# Patient Record
Sex: Female | Born: 1945 | Race: Black or African American | Hispanic: No | Marital: Single | Smoking: Former smoker
Health system: Southern US, Community
[De-identification: ages and names within clinical notes are randomized; demographics above are authoritative.]

## PROBLEM LIST (undated history)

## (undated) DIAGNOSIS — E039 Hypothyroidism, unspecified: Secondary | ICD-10-CM

## (undated) DIAGNOSIS — M179 Osteoarthritis of knee, unspecified: Secondary | ICD-10-CM

## (undated) DIAGNOSIS — E669 Obesity, unspecified: Secondary | ICD-10-CM

## (undated) DIAGNOSIS — E785 Hyperlipidemia, unspecified: Secondary | ICD-10-CM

## (undated) DIAGNOSIS — Z6830 Body mass index (BMI) 30.0-30.9, adult: Secondary | ICD-10-CM

## (undated) DIAGNOSIS — M25569 Pain in unspecified knee: Secondary | ICD-10-CM

## (undated) DIAGNOSIS — I10 Essential (primary) hypertension: Secondary | ICD-10-CM

## (undated) DIAGNOSIS — M1710 Unilateral primary osteoarthritis, unspecified knee: Secondary | ICD-10-CM

## (undated) DIAGNOSIS — E119 Type 2 diabetes mellitus without complications: Secondary | ICD-10-CM

## (undated) DIAGNOSIS — A048 Other specified bacterial intestinal infections: Secondary | ICD-10-CM

## (undated) DIAGNOSIS — K219 Gastro-esophageal reflux disease without esophagitis: Secondary | ICD-10-CM

## (undated) DIAGNOSIS — Z794 Long term (current) use of insulin: Secondary | ICD-10-CM

## (undated) HISTORY — DX: Essential (primary) hypertension: I10

## (undated) HISTORY — DX: Body mass index (BMI) 30.0-30.9, adult: Z68.30

## (undated) HISTORY — PX: COLONOSCOPY: SHX174

## (undated) HISTORY — DX: Obesity, unspecified: E66.9

## (undated) HISTORY — DX: Osteoarthritis of knee, unspecified: M17.9

## (undated) HISTORY — DX: Pain in unspecified knee: M25.569

## (undated) HISTORY — PX: UPPER GASTROINTESTINAL ENDOSCOPY: SHX188

## (undated) HISTORY — DX: Unilateral primary osteoarthritis, unspecified knee: M17.10

## (undated) HISTORY — DX: Long term (current) use of insulin: Z79.4

## (undated) HISTORY — PX: CHOLECYSTECTOMY: SHX55

## (undated) HISTORY — DX: Other specified bacterial intestinal infections: A04.8

## (undated) HISTORY — DX: Type 2 diabetes mellitus without complications: E11.9

## (undated) HISTORY — DX: Hyperlipidemia, unspecified: E78.5

---

## 1968-03-02 HISTORY — PX: TOTAL ABDOMINAL HYSTERECTOMY: SHX209

## 1968-03-02 HISTORY — PX: ABDOMINAL HYSTERECTOMY: SHX81

## 1985-03-02 DIAGNOSIS — IMO0001 Reserved for inherently not codable concepts without codable children: Secondary | ICD-10-CM

## 1985-03-02 HISTORY — DX: Reserved for inherently not codable concepts without codable children: IMO0001

## 2000-10-21 ENCOUNTER — Encounter: Payer: Self-pay | Admitting: Family Medicine

## 2000-10-21 ENCOUNTER — Ambulatory Visit (HOSPITAL_COMMUNITY): Admission: RE | Admit: 2000-10-21 | Discharge: 2000-10-21 | Payer: Self-pay | Admitting: Family Medicine

## 2000-12-10 ENCOUNTER — Ambulatory Visit (HOSPITAL_COMMUNITY): Admission: RE | Admit: 2000-12-10 | Discharge: 2000-12-10 | Payer: Self-pay | Admitting: Family Medicine

## 2000-12-10 ENCOUNTER — Encounter: Payer: Self-pay | Admitting: Family Medicine

## 2001-05-26 ENCOUNTER — Ambulatory Visit (HOSPITAL_COMMUNITY): Admission: RE | Admit: 2001-05-26 | Discharge: 2001-05-26 | Payer: Self-pay | Admitting: Internal Medicine

## 2001-05-26 ENCOUNTER — Encounter (INDEPENDENT_AMBULATORY_CARE_PROVIDER_SITE_OTHER): Payer: Self-pay | Admitting: Internal Medicine

## 2001-10-24 ENCOUNTER — Ambulatory Visit (HOSPITAL_COMMUNITY): Admission: RE | Admit: 2001-10-24 | Discharge: 2001-10-24 | Payer: Self-pay | Admitting: Family Medicine

## 2001-10-24 ENCOUNTER — Encounter: Payer: Self-pay | Admitting: Family Medicine

## 2002-10-25 ENCOUNTER — Ambulatory Visit (HOSPITAL_COMMUNITY): Admission: RE | Admit: 2002-10-25 | Discharge: 2002-10-25 | Payer: Self-pay | Admitting: Family Medicine

## 2002-10-25 ENCOUNTER — Encounter: Payer: Self-pay | Admitting: Family Medicine

## 2002-12-14 ENCOUNTER — Encounter: Payer: Self-pay | Admitting: Family Medicine

## 2002-12-14 ENCOUNTER — Ambulatory Visit (HOSPITAL_COMMUNITY): Admission: RE | Admit: 2002-12-14 | Discharge: 2002-12-14 | Payer: Self-pay | Admitting: Family Medicine

## 2002-12-16 ENCOUNTER — Inpatient Hospital Stay (HOSPITAL_COMMUNITY): Admission: EM | Admit: 2002-12-16 | Discharge: 2002-12-20 | Payer: Self-pay | Admitting: Emergency Medicine

## 2002-12-16 ENCOUNTER — Encounter: Payer: Self-pay | Admitting: Emergency Medicine

## 2002-12-18 ENCOUNTER — Encounter: Payer: Self-pay | Admitting: Family Medicine

## 2002-12-19 ENCOUNTER — Encounter (INDEPENDENT_AMBULATORY_CARE_PROVIDER_SITE_OTHER): Payer: Self-pay | Admitting: Internal Medicine

## 2002-12-19 HISTORY — PX: ESOPHAGOGASTRODUODENOSCOPY: SHX1529

## 2003-10-26 ENCOUNTER — Ambulatory Visit (HOSPITAL_COMMUNITY): Admission: RE | Admit: 2003-10-26 | Discharge: 2003-10-26 | Payer: Self-pay | Admitting: Family Medicine

## 2004-02-11 ENCOUNTER — Ambulatory Visit: Payer: Self-pay | Admitting: Family Medicine

## 2004-04-01 ENCOUNTER — Ambulatory Visit: Payer: Self-pay | Admitting: Family Medicine

## 2004-04-22 ENCOUNTER — Ambulatory Visit: Payer: Self-pay | Admitting: *Deleted

## 2004-05-15 ENCOUNTER — Encounter (HOSPITAL_COMMUNITY): Admission: RE | Admit: 2004-05-15 | Discharge: 2004-06-14 | Payer: Self-pay | Admitting: *Deleted

## 2004-05-15 ENCOUNTER — Ambulatory Visit: Payer: Self-pay | Admitting: Cardiology

## 2004-05-16 ENCOUNTER — Emergency Department (HOSPITAL_COMMUNITY): Admission: EM | Admit: 2004-05-16 | Discharge: 2004-05-16 | Payer: Self-pay | Admitting: Emergency Medicine

## 2004-05-21 ENCOUNTER — Ambulatory Visit: Payer: Self-pay | Admitting: Orthopedic Surgery

## 2004-05-23 ENCOUNTER — Ambulatory Visit: Payer: Self-pay | Admitting: *Deleted

## 2004-06-02 ENCOUNTER — Ambulatory Visit: Payer: Self-pay | Admitting: Family Medicine

## 2004-06-24 ENCOUNTER — Encounter: Payer: Self-pay | Admitting: Family Medicine

## 2004-06-24 ENCOUNTER — Ambulatory Visit (HOSPITAL_COMMUNITY): Admission: RE | Admit: 2004-06-24 | Discharge: 2004-06-24 | Payer: Self-pay | Admitting: Family Medicine

## 2004-08-04 ENCOUNTER — Ambulatory Visit: Payer: Self-pay | Admitting: Family Medicine

## 2004-10-29 ENCOUNTER — Ambulatory Visit (HOSPITAL_COMMUNITY): Admission: RE | Admit: 2004-10-29 | Discharge: 2004-10-29 | Payer: Self-pay | Admitting: Family Medicine

## 2004-11-06 ENCOUNTER — Ambulatory Visit: Payer: Self-pay | Admitting: Family Medicine

## 2004-11-19 ENCOUNTER — Ambulatory Visit (HOSPITAL_COMMUNITY): Admission: RE | Admit: 2004-11-19 | Discharge: 2004-11-19 | Payer: Self-pay | Admitting: Family Medicine

## 2005-02-09 ENCOUNTER — Ambulatory Visit: Payer: Self-pay | Admitting: Family Medicine

## 2005-03-17 ENCOUNTER — Ambulatory Visit: Payer: Self-pay | Admitting: Family Medicine

## 2005-05-19 ENCOUNTER — Ambulatory Visit: Payer: Self-pay | Admitting: Family Medicine

## 2005-05-27 ENCOUNTER — Ambulatory Visit (HOSPITAL_COMMUNITY): Admission: RE | Admit: 2005-05-27 | Discharge: 2005-05-27 | Payer: Self-pay | Admitting: Family Medicine

## 2005-06-30 ENCOUNTER — Ambulatory Visit: Payer: Self-pay | Admitting: Family Medicine

## 2005-09-08 ENCOUNTER — Ambulatory Visit: Payer: Self-pay | Admitting: Family Medicine

## 2005-11-11 ENCOUNTER — Ambulatory Visit (HOSPITAL_COMMUNITY): Admission: RE | Admit: 2005-11-11 | Discharge: 2005-11-11 | Payer: Self-pay | Admitting: Family Medicine

## 2005-12-24 ENCOUNTER — Ambulatory Visit: Payer: Self-pay | Admitting: Family Medicine

## 2005-12-24 ENCOUNTER — Other Ambulatory Visit: Admission: RE | Admit: 2005-12-24 | Discharge: 2005-12-24 | Payer: Self-pay | Admitting: Family Medicine

## 2005-12-24 LAB — CONVERTED CEMR LAB: Pap Smear: NORMAL

## 2006-03-29 ENCOUNTER — Encounter: Payer: Self-pay | Admitting: Family Medicine

## 2006-04-05 ENCOUNTER — Ambulatory Visit: Payer: Self-pay | Admitting: Family Medicine

## 2006-06-02 ENCOUNTER — Ambulatory Visit (HOSPITAL_COMMUNITY): Admission: RE | Admit: 2006-06-02 | Discharge: 2006-06-02 | Payer: Self-pay | Admitting: Family Medicine

## 2006-06-24 ENCOUNTER — Encounter: Payer: Self-pay | Admitting: Family Medicine

## 2006-06-24 LAB — CONVERTED CEMR LAB
ALT: 26 units/L (ref 0–35)
AST: 23 units/L (ref 0–37)
BUN: 15 mg/dL (ref 6–23)
Bilirubin, Direct: 0.1 mg/dL (ref 0.0–0.3)
CO2: 21 meq/L (ref 19–32)
Calcium: 9.2 mg/dL (ref 8.4–10.5)
Cholesterol: 125 mg/dL (ref 0–200)
Indirect Bilirubin: 0.2 mg/dL (ref 0.0–0.9)
LDL Cholesterol: 61 mg/dL (ref 0–99)
Potassium: 4.2 meq/L (ref 3.5–5.3)
Total Bilirubin: 0.3 mg/dL (ref 0.3–1.2)
Total CHOL/HDL Ratio: 2.2
Triglycerides: 38 mg/dL (ref <150)
VLDL: 8 mg/dL (ref 0–40)

## 2006-07-08 ENCOUNTER — Ambulatory Visit: Payer: Self-pay | Admitting: Family Medicine

## 2006-08-02 ENCOUNTER — Ambulatory Visit: Payer: Self-pay | Admitting: Family Medicine

## 2006-10-21 ENCOUNTER — Ambulatory Visit: Payer: Self-pay | Admitting: Orthopedic Surgery

## 2006-10-25 ENCOUNTER — Encounter: Payer: Self-pay | Admitting: Family Medicine

## 2006-10-25 LAB — CONVERTED CEMR LAB
Alkaline Phosphatase: 75 units/L (ref 39–117)
Bilirubin, Direct: <0.1 mg/dL (ref 0.0–0.3)
CO2: 23 meq/L (ref 19–32)
Calcium: 9.5 mg/dL (ref 8.4–10.5)
Creatinine, Ser: 0.77 mg/dL (ref 0.40–1.20)
Hgb A1c MFr Bld: 7.5 % — ABNORMAL HIGH (ref 4.6–6.1)
Potassium: 4.2 meq/L (ref 3.5–5.3)
Sodium: 141 meq/L (ref 135–145)
Total Bilirubin: 0.3 mg/dL (ref 0.3–1.2)
Total CHOL/HDL Ratio: 2.3

## 2006-11-11 ENCOUNTER — Emergency Department (HOSPITAL_COMMUNITY): Admission: EM | Admit: 2006-11-11 | Discharge: 2006-11-11 | Payer: Self-pay | Admitting: Emergency Medicine

## 2006-11-12 ENCOUNTER — Ambulatory Visit: Payer: Self-pay | Admitting: Family Medicine

## 2006-11-12 LAB — CONVERTED CEMR LAB: Uric Acid, Serum: 4.8 mg/dL (ref 2.4–7.0)

## 2007-02-17 ENCOUNTER — Ambulatory Visit: Payer: Self-pay | Admitting: Family Medicine

## 2007-02-17 LAB — CONVERTED CEMR LAB
Blood Glucose, Fasting: 221 mg/dL
Microalb, Ur: 0.2 mg/dL (ref 0.00–1.89)
Microalbumin U total vol: 0.2 mg/L

## 2007-03-31 ENCOUNTER — Encounter: Payer: Self-pay | Admitting: Family Medicine

## 2007-03-31 DIAGNOSIS — E079 Disorder of thyroid, unspecified: Secondary | ICD-10-CM | POA: Insufficient documentation

## 2007-03-31 DIAGNOSIS — E782 Mixed hyperlipidemia: Secondary | ICD-10-CM | POA: Insufficient documentation

## 2007-03-31 DIAGNOSIS — E669 Obesity, unspecified: Secondary | ICD-10-CM | POA: Insufficient documentation

## 2007-03-31 DIAGNOSIS — E1159 Type 2 diabetes mellitus with other circulatory complications: Secondary | ICD-10-CM | POA: Insufficient documentation

## 2007-03-31 DIAGNOSIS — I10 Essential (primary) hypertension: Secondary | ICD-10-CM

## 2007-03-31 DIAGNOSIS — E785 Hyperlipidemia, unspecified: Secondary | ICD-10-CM

## 2007-04-14 ENCOUNTER — Ambulatory Visit: Payer: Self-pay | Admitting: Orthopedic Surgery

## 2007-04-14 DIAGNOSIS — M1710 Unilateral primary osteoarthritis, unspecified knee: Secondary | ICD-10-CM

## 2007-04-14 DIAGNOSIS — M25569 Pain in unspecified knee: Secondary | ICD-10-CM

## 2007-05-07 ENCOUNTER — Encounter: Payer: Self-pay | Admitting: Family Medicine

## 2007-05-07 LAB — CONVERTED CEMR LAB
AST: 22 units/L (ref 0–37)
Alkaline Phosphatase: 71 units/L (ref 39–117)
Bilirubin, Direct: 0.1 mg/dL (ref 0.0–0.3)
Chloride: 103 meq/L (ref 96–112)
Creatinine, Ser: 0.7 mg/dL (ref 0.40–1.20)
Eosinophils Absolute: 0.1 10*3/uL (ref 0.0–0.7)
Eosinophils Relative: 2 % (ref 0–5)
Glucose, Bld: 118 mg/dL — ABNORMAL HIGH (ref 70–99)
HCT: 38.3 % (ref 36.0–46.0)
HDL: 53 mg/dL (ref >39)
Hemoglobin: 12.2 g/dL (ref 12.0–15.0)
Indirect Bilirubin: 0.4 mg/dL (ref 0.0–0.9)
Lymphocytes Relative: 31 % (ref 12–46)
Neutro Abs: 4 10*3/uL (ref 1.7–7.7)
Platelets: 414 10*3/uL — ABNORMAL HIGH (ref 150–400)
Potassium: 4.7 meq/L (ref 3.5–5.3)
RBC: 4.02 M/uL (ref 3.87–5.11)
RDW: 13.4 % (ref 11.5–15.5)
Sodium: 139 meq/L (ref 135–145)
Total Bilirubin: 0.5 mg/dL (ref 0.3–1.2)
Total Protein: 7.3 g/dL (ref 6.0–8.3)
VLDL: 6 mg/dL (ref 0–40)
WBC: 6.6 10*3/uL (ref 4.0–10.5)

## 2007-05-16 ENCOUNTER — Ambulatory Visit: Payer: Self-pay | Admitting: Family Medicine

## 2007-06-09 ENCOUNTER — Ambulatory Visit (HOSPITAL_COMMUNITY): Admission: RE | Admit: 2007-06-09 | Discharge: 2007-06-09 | Payer: Self-pay | Admitting: Family Medicine

## 2007-07-11 ENCOUNTER — Ambulatory Visit (HOSPITAL_COMMUNITY): Admission: RE | Admit: 2007-07-11 | Discharge: 2007-07-11 | Payer: Self-pay | Admitting: Gastroenterology

## 2007-07-11 ENCOUNTER — Ambulatory Visit: Payer: Self-pay | Admitting: Gastroenterology

## 2007-08-30 ENCOUNTER — Ambulatory Visit: Payer: Self-pay | Admitting: Family Medicine

## 2007-08-30 ENCOUNTER — Other Ambulatory Visit: Admission: RE | Admit: 2007-08-30 | Discharge: 2007-08-30 | Payer: Self-pay | Admitting: Family Medicine

## 2007-08-30 ENCOUNTER — Encounter: Payer: Self-pay | Admitting: Family Medicine

## 2007-08-30 LAB — CONVERTED CEMR LAB: Pap Smear: NORMAL

## 2007-09-07 ENCOUNTER — Encounter: Payer: Self-pay | Admitting: Family Medicine

## 2007-11-14 ENCOUNTER — Ambulatory Visit: Payer: Self-pay | Admitting: Orthopedic Surgery

## 2007-11-14 DIAGNOSIS — M25469 Effusion, unspecified knee: Secondary | ICD-10-CM

## 2007-11-19 ENCOUNTER — Encounter: Payer: Self-pay | Admitting: Family Medicine

## 2007-11-30 ENCOUNTER — Ambulatory Visit: Payer: Self-pay | Admitting: Family Medicine

## 2007-11-30 LAB — CONVERTED CEMR LAB
ALT: 18 units/L (ref 0–35)
AST: 18 units/L (ref 0–37)
Albumin: 4.6 g/dL (ref 3.5–5.2)
Alkaline Phosphatase: 77 units/L (ref 39–117)
BUN: 22 mg/dL (ref 6–23)
Bilirubin, Direct: 0.1 mg/dL (ref 0.0–0.3)
CO2: 23 meq/L (ref 19–32)
Calcium: 9.7 mg/dL (ref 8.4–10.5)
Chloride: 99 meq/L (ref 96–112)
Cholesterol: 204 mg/dL — ABNORMAL HIGH (ref 0–200)
Creatinine, Ser: 0.82 mg/dL (ref 0.40–1.20)
Glucose, Bld: 63 mg/dL — ABNORMAL LOW (ref 70–99)
HDL: 64 mg/dL (ref >39)
Hgb A1c MFr Bld: 6.7 %
Indirect Bilirubin: 0.4 mg/dL (ref 0.0–0.9)
LDL Cholesterol: 129 mg/dL — ABNORMAL HIGH (ref 0–99)
Potassium: 4.7 meq/L (ref 3.5–5.3)
Sodium: 135 meq/L (ref 135–145)
Total Bilirubin: 0.5 mg/dL (ref 0.3–1.2)
Total CHOL/HDL Ratio: 3.2
Total Protein: 8.1 g/dL (ref 6.0–8.3)
Triglycerides: 53 mg/dL (ref <150)
VLDL: 11 mg/dL (ref 0–40)

## 2008-01-27 ENCOUNTER — Encounter: Payer: Self-pay | Admitting: Family Medicine

## 2008-01-27 LAB — CONVERTED CEMR LAB
ALT: 20 units/L (ref 0–35)
AST: 18 units/L (ref 0–37)
Alkaline Phosphatase: 75 units/L (ref 39–117)
BUN: 17 mg/dL (ref 6–23)
Cholesterol: 148 mg/dL (ref 0–200)
Creatinine, Ser: 0.72 mg/dL (ref 0.40–1.20)
Indirect Bilirubin: 0.3 mg/dL (ref 0.0–0.9)
Potassium: 4.5 meq/L (ref 3.5–5.3)
Total Protein: 7.4 g/dL (ref 6.0–8.3)
Triglycerides: 57 mg/dL (ref <150)
VLDL: 11 mg/dL (ref 0–40)

## 2008-02-09 ENCOUNTER — Ambulatory Visit: Payer: Self-pay | Admitting: Family Medicine

## 2008-02-15 ENCOUNTER — Ambulatory Visit: Payer: Self-pay | Admitting: Orthopedic Surgery

## 2008-02-15 DIAGNOSIS — M1710 Unilateral primary osteoarthritis, unspecified knee: Secondary | ICD-10-CM

## 2008-03-05 ENCOUNTER — Telehealth: Payer: Self-pay | Admitting: Family Medicine

## 2008-04-09 ENCOUNTER — Telehealth: Payer: Self-pay | Admitting: Family Medicine

## 2008-04-10 ENCOUNTER — Encounter: Payer: Self-pay | Admitting: Family Medicine

## 2008-05-16 ENCOUNTER — Ambulatory Visit: Payer: Self-pay | Admitting: Orthopedic Surgery

## 2008-06-11 ENCOUNTER — Ambulatory Visit (HOSPITAL_COMMUNITY): Admission: RE | Admit: 2008-06-11 | Discharge: 2008-06-11 | Payer: Self-pay | Admitting: Family Medicine

## 2008-06-22 ENCOUNTER — Encounter: Payer: Self-pay | Admitting: Family Medicine

## 2008-06-22 LAB — CONVERTED CEMR LAB
ALT: 23 units/L (ref 0–35)
Albumin: 4.4 g/dL (ref 3.5–5.2)
Bilirubin, Direct: 0.1 mg/dL (ref 0.0–0.3)
CO2: 23 meq/L (ref 19–32)
Chloride: 103 meq/L (ref 96–112)
Glucose, Bld: 99 mg/dL (ref 70–99)
Potassium: 4.2 meq/L (ref 3.5–5.3)
Sodium: 139 meq/L (ref 135–145)
Total Bilirubin: 0.3 mg/dL (ref 0.3–1.2)
Total CHOL/HDL Ratio: 2.6
VLDL: 11 mg/dL (ref 0–40)

## 2008-06-28 ENCOUNTER — Ambulatory Visit: Payer: Self-pay | Admitting: Family Medicine

## 2008-06-28 LAB — CONVERTED CEMR LAB
Glucose, Bld: 108 mg/dL
Hgb A1c MFr Bld: 7.6 %

## 2008-08-21 ENCOUNTER — Encounter: Payer: Self-pay | Admitting: Family Medicine

## 2008-09-21 ENCOUNTER — Encounter: Payer: Self-pay | Admitting: Family Medicine

## 2008-10-08 ENCOUNTER — Ambulatory Visit: Payer: Self-pay | Admitting: Family Medicine

## 2008-10-08 LAB — CONVERTED CEMR LAB: Glucose, Bld: 284 mg/dL

## 2008-11-13 ENCOUNTER — Ambulatory Visit: Payer: Self-pay | Admitting: Family Medicine

## 2008-11-13 ENCOUNTER — Ambulatory Visit (HOSPITAL_COMMUNITY): Admission: RE | Admit: 2008-11-13 | Discharge: 2008-11-13 | Payer: Self-pay | Admitting: Family Medicine

## 2008-11-13 LAB — CONVERTED CEMR LAB
BUN: 35 mg/dL — ABNORMAL HIGH (ref 6–23)
CO2: 28 meq/L (ref 19–32)
Calcium: 9.8 mg/dL (ref 8.4–10.5)
Creatinine, Ser: 1.08 mg/dL (ref 0.40–1.20)
Glucose, Bld: 125 mg/dL

## 2008-11-14 ENCOUNTER — Ambulatory Visit: Payer: Self-pay | Admitting: Orthopedic Surgery

## 2008-11-20 ENCOUNTER — Encounter (HOSPITAL_COMMUNITY): Admission: RE | Admit: 2008-11-20 | Discharge: 2008-11-28 | Payer: Self-pay | Admitting: General Surgery

## 2008-11-20 ENCOUNTER — Encounter: Payer: Self-pay | Admitting: Family Medicine

## 2008-11-27 ENCOUNTER — Ambulatory Visit: Payer: Self-pay | Admitting: Family Medicine

## 2008-11-27 LAB — CONVERTED CEMR LAB: Glucose, Bld: 122 mg/dL

## 2008-12-24 ENCOUNTER — Ambulatory Visit (HOSPITAL_COMMUNITY): Admission: RE | Admit: 2008-12-24 | Discharge: 2008-12-24 | Payer: Self-pay | Admitting: General Surgery

## 2008-12-24 ENCOUNTER — Encounter (INDEPENDENT_AMBULATORY_CARE_PROVIDER_SITE_OTHER): Payer: Self-pay | Admitting: General Surgery

## 2009-01-16 ENCOUNTER — Ambulatory Visit: Payer: Self-pay | Admitting: Family Medicine

## 2009-02-27 ENCOUNTER — Ambulatory Visit: Payer: Self-pay | Admitting: Family Medicine

## 2009-03-25 ENCOUNTER — Encounter: Payer: Self-pay | Admitting: Family Medicine

## 2009-03-26 LAB — CONVERTED CEMR LAB
Albumin: 4.3 g/dL (ref 3.5–5.2)
Bilirubin, Direct: 0.1 mg/dL (ref 0.0–0.3)
CO2: 26 meq/L (ref 19–32)
Chloride: 102 meq/L (ref 96–112)
Glucose, Bld: 98 mg/dL (ref 70–99)
HDL: 51 mg/dL (ref >39)
LDL Cholesterol: 73 mg/dL (ref 0–99)
Potassium: 4.1 meq/L (ref 3.5–5.3)
Sodium: 137 meq/L (ref 135–145)
TSH: 1.073 microintl units/mL (ref 0.350–4.500)
Total Bilirubin: 0.4 mg/dL (ref 0.3–1.2)
Total CHOL/HDL Ratio: 2.6
VLDL: 9 mg/dL (ref 0–40)

## 2009-04-24 ENCOUNTER — Encounter: Payer: Self-pay | Admitting: Family Medicine

## 2009-05-09 ENCOUNTER — Ambulatory Visit: Payer: Self-pay | Admitting: Family Medicine

## 2009-05-09 DIAGNOSIS — R5381 Other malaise: Secondary | ICD-10-CM

## 2009-05-09 DIAGNOSIS — R5383 Other fatigue: Secondary | ICD-10-CM

## 2009-05-15 LAB — CONVERTED CEMR LAB
Basophils Absolute: 0 10*3/uL (ref 0.0–0.1)
Eosinophils Relative: 2 % (ref 0–5)
HCT: 37.2 % (ref 36.0–46.0)
Hemoglobin: 12.1 g/dL (ref 12.0–15.0)
Hgb A1c MFr Bld: 7.1 % — ABNORMAL HIGH (ref 4.6–6.1)
Lymphocytes Relative: 28 % (ref 12–46)
Lymphs Abs: 1.9 10*3/uL (ref 0.7–4.0)
Monocytes Absolute: 0.6 10*3/uL (ref 0.1–1.0)
Neutro Abs: 4.2 10*3/uL (ref 1.7–7.7)
RDW: 13.3 % (ref 11.5–15.5)
TSH: 1.734 microintl units/mL (ref 0.350–4.500)
Vit D, 25-Hydroxy: 31 ng/mL (ref 30–89)
WBC: 6.8 10*3/uL (ref 4.0–10.5)

## 2009-05-20 ENCOUNTER — Ambulatory Visit: Payer: Self-pay | Admitting: Orthopedic Surgery

## 2009-05-22 ENCOUNTER — Encounter: Payer: Self-pay | Admitting: Family Medicine

## 2009-06-13 ENCOUNTER — Ambulatory Visit (HOSPITAL_COMMUNITY): Admission: RE | Admit: 2009-06-13 | Discharge: 2009-06-13 | Payer: Self-pay | Admitting: Family Medicine

## 2009-06-25 ENCOUNTER — Encounter: Payer: Self-pay | Admitting: Family Medicine

## 2009-07-22 ENCOUNTER — Telehealth: Payer: Self-pay | Admitting: Family Medicine

## 2009-08-13 ENCOUNTER — Encounter: Payer: Self-pay | Admitting: Family Medicine

## 2009-08-24 LAB — CONVERTED CEMR LAB
AST: 20 units/L (ref 0–37)
Alkaline Phosphatase: 73 units/L (ref 39–117)
BUN: 28 mg/dL — ABNORMAL HIGH (ref 6–23)
Bilirubin, Direct: 0.1 mg/dL (ref 0.0–0.3)
CO2: 20 meq/L (ref 19–32)
Calcium: 9.5 mg/dL (ref 8.4–10.5)
Chloride: 102 meq/L (ref 96–112)
Creatinine, Ser: 0.85 mg/dL (ref 0.40–1.20)
Indirect Bilirubin: 0.3 mg/dL (ref 0.0–0.9)
LDL Cholesterol: 97 mg/dL (ref 0–99)
Microalb Creat Ratio: 4.1 mg/g (ref 0.0–30.0)
Total Bilirubin: 0.4 mg/dL (ref 0.3–1.2)

## 2009-09-10 ENCOUNTER — Ambulatory Visit: Payer: Self-pay | Admitting: Family Medicine

## 2009-09-10 ENCOUNTER — Other Ambulatory Visit: Admission: RE | Admit: 2009-09-10 | Discharge: 2009-09-10 | Payer: Self-pay | Admitting: Family Medicine

## 2009-09-10 DIAGNOSIS — IMO0002 Reserved for concepts with insufficient information to code with codable children: Secondary | ICD-10-CM

## 2009-09-11 ENCOUNTER — Ambulatory Visit (HOSPITAL_COMMUNITY): Admission: RE | Admit: 2009-09-11 | Discharge: 2009-09-11 | Payer: Self-pay | Admitting: Family Medicine

## 2009-09-16 ENCOUNTER — Ambulatory Visit (HOSPITAL_COMMUNITY): Admission: RE | Admit: 2009-09-16 | Discharge: 2009-09-16 | Payer: Self-pay | Admitting: Family Medicine

## 2009-09-17 ENCOUNTER — Encounter: Payer: Self-pay | Admitting: Family Medicine

## 2009-09-17 LAB — CONVERTED CEMR LAB: Pap Smear: NEGATIVE

## 2009-09-23 ENCOUNTER — Ambulatory Visit: Payer: Self-pay | Admitting: Family Medicine

## 2009-09-30 ENCOUNTER — Encounter: Payer: Self-pay | Admitting: Family Medicine

## 2009-10-07 ENCOUNTER — Encounter: Payer: Self-pay | Admitting: Family Medicine

## 2009-10-21 ENCOUNTER — Encounter: Payer: Self-pay | Admitting: Family Medicine

## 2009-10-23 ENCOUNTER — Encounter: Payer: Self-pay | Admitting: Family Medicine

## 2009-11-07 ENCOUNTER — Encounter: Payer: Self-pay | Admitting: Family Medicine

## 2009-11-12 ENCOUNTER — Encounter: Payer: Self-pay | Admitting: Family Medicine

## 2009-11-13 ENCOUNTER — Ambulatory Visit: Payer: Self-pay | Admitting: Orthopedic Surgery

## 2009-11-27 ENCOUNTER — Telehealth: Payer: Self-pay | Admitting: Family Medicine

## 2009-12-09 ENCOUNTER — Telehealth: Payer: Self-pay | Admitting: Family Medicine

## 2009-12-21 LAB — CONVERTED CEMR LAB
CO2: 23 meq/L (ref 19–32)
Calcium: 9.7 mg/dL (ref 8.4–10.5)
Chloride: 102 meq/L (ref 96–112)
Glucose, Bld: 126 mg/dL — ABNORMAL HIGH (ref 70–99)
Potassium: 4.5 meq/L (ref 3.5–5.3)
Sodium: 137 meq/L (ref 135–145)

## 2010-01-01 ENCOUNTER — Ambulatory Visit: Payer: Self-pay | Admitting: Family Medicine

## 2010-03-05 ENCOUNTER — Telehealth: Payer: Self-pay | Admitting: Family Medicine

## 2010-03-13 ENCOUNTER — Telehealth: Payer: Self-pay | Admitting: Family Medicine

## 2010-03-17 ENCOUNTER — Ambulatory Visit: Admit: 2010-03-17 | Payer: Self-pay | Admitting: Family Medicine

## 2010-03-18 ENCOUNTER — Ambulatory Visit
Admission: RE | Admit: 2010-03-18 | Discharge: 2010-03-18 | Payer: Self-pay | Source: Home / Self Care | Attending: Family Medicine | Admitting: Family Medicine

## 2010-03-23 ENCOUNTER — Encounter: Payer: Self-pay | Admitting: Family Medicine

## 2010-03-24 ENCOUNTER — Telehealth (INDEPENDENT_AMBULATORY_CARE_PROVIDER_SITE_OTHER): Payer: Self-pay | Admitting: *Deleted

## 2010-04-01 NOTE — Letter (Signed)
Summary: LINK TO [facility]LLNESS  LINK TO [facility]   Imported By: Lind Guest 06/25/2009 13:17:03  _____________________________________________________________________  External Attachment:    Type:   Image     Comment:   External Document

## 2010-04-01 NOTE — Progress Notes (Signed)
Summary: EYE EXAM  EYE EXAM   Imported By: Lind Guest 08/20/2009 08:55:11  _____________________________________________________________________  External Attachment:    Type:   Image     Comment:   External Document

## 2010-04-01 NOTE — Assessment & Plan Note (Signed)
Summary: office visit   Vital Signs:  Patient profile:   65 year old female Menstrual status:  hysterectomy Height:      68 inches Weight:      203.50 pounds BMI:     31.05 Pulse (ortho):   72 / minute Pulse rhythm:   regular Resp:     16 per minute BP sitting:   120 / 74  (left arm) Cuff size:   large  Vitals Entered By: Everitt Amber LPN (May 09, 2009 8:16 AM)  Nutrition Counseling: Patient's BMI is greater than 25 and therefore counseled on weight management options. CC: Follow up chronic problems Is Patient Diabetic? No   Primary Care Provider:  Syliva Overman MD  CC:  Follow up chronic problems.  History of Present Illness: Reports  that she has been doing well. Denies recent fever or chills. Denies sinus pressure, nasal congestion , ear pain or sore throat. Denies chest congestion, or cough productive of sputum. Denies chest pain, palpitations, PND, orthopnea or leg swelling. Denies abdominal pain, nausea, vomitting, diarrhea or constipation. Denies change in bowel movements or bloody stool. Denies dysuria , frequency, incontinence or hesitancy. Continued  joint pain, and reduced mobility, primarily affecting the knees. Denies headaches, vertigo, seizures. Denies depression, anxiety or insomnia. Denies  rash, lesions, or itch.     Current Medications (verified): 1)  Glyburide-Metformin 5-500 Mg  Tabs (Glyburide-Metformin) .... Take Two Tablets By Mouth Twice A Ay 2)  Benazepril-Hydrochlorothiazide 20-12.5 Mg  Tabs (Benazepril-Hydrochlorothiazide) .... Take Two Tablets By Mouth in The Mornings 3)  Humalog Mix 75/25 75-25 %  Susp (Insulin Lispro Prot & Lispro) .... Inject 27 Units Subcutaneously in The Mornings and 20 Units Subcutaneously in The Evenings 4)  Freestyle Lancets  Misc (Lancets) .... Three Times A Day Testing 5)  Simvastatin 80 Mg Tabs (Simvastatin) .... One Tab By Mouth Qhs 6)  Bd Ultra-Fine Pen Needles 29g X 12.67mm Misc (Insulin Pen Needle) ....  Uad 7)  Cataflam 50 Mg Tabs (Diclofenac Potassium) .Marland Kitchen.. 1 By Mouth Q 12  Allergies (verified): No Known Drug Allergies  Review of Systems      See HPI Eyes:  See HPI; Denies blurring and discharge. Endo:  Denies cold intolerance, excessive hunger, excessive thirst, excessive urination, heat intolerance, polyuria, and weight change; tests twice daily, log shows sugar average of about 160. Heme:  Denies abnormal bruising and bleeding. Allergy:  Denies hives or rash and itching eyes.  Physical Exam  General:  Well-developed,well-nourished,in no acute distress; alert,appropriate and cooperative throughout examination HEENT: No facial asymmetry,  EOMI, No sinus tenderness, TM's Clear, oropharynx  pink and moist.   Chest: Clear to auscultation bilaterally.  CVS: S1, S2, No murmurs, No S3.   Abd: Soft, Nontender.  MS: Adequate ROM spine, hips, shoulders and  reduced in knees.  Ext: No edema.   CNS: CN 2-12 intact, power tone and sensation normal throughout.   Skin: Intact, no visible lesions or rashes.abdominal scar well healed, no erythema or drainage.  Psych: Good eye contact, normal affect.  Memory intact, not anxious or depressed appearing.    Diabetes Management Exam:    Foot Exam (with socks and/or shoes not present):       Sensory-Monofilament:          Left foot: diminished          Right foot: diminished       Inspection:          Left foot: normal  Right foot: normal       Nails:          Left foot: normal          Right foot: normal   Impression & Recommendations:  Problem # 1:  DEGENERATIVE JOINT DISEASE, KNEES, BILATERAL (ICD-715.96) Assessment Unchanged  The following medications were removed from the medication list:    Ultracet 37.5-325 Mg Tabs (Tramadol-acetaminophen) .Marland Kitchen... Take one tablet by mouth every 4 hours as needed    Hydrocodone-acetaminophen 5-500 Mg Tabs (Hydrocodone-acetaminophen) .Marland Kitchen... 1-2 tabs q 4 hrs as needed Her updated medication  list for this problem includes:    Cataflam 50 Mg Tabs (Diclofenac potassium) .Marland Kitchen... 1 by mouth q 12  Problem # 2:  OBESITY, UNSPECIFIED (ICD-278.00) Assessment: Unchanged  Ht: 68 (05/09/2009)   Wt: 203.50 (05/09/2009)   BMI: 31.05 (05/09/2009)  Problem # 3:  IDDM (ICD-250.01) Assessment: Comment Only  Her updated medication list for this problem includes:    Glyburide-metformin 5-500 Mg Tabs (Glyburide-metformin) .Marland Kitchen... Take two tablets by mouth twice a ay    Benazepril-hydrochlorothiazide 20-12.5 Mg Tabs (Benazepril-hydrochlorothiazide) .Marland Kitchen... Take two tablets by mouth in the mornings    Humalog Mix 75/25 75-25 % Susp (Insulin lispro prot & lispro) ..... Inject 27 units subcutaneously in the mornings and 20 units subcutaneously in the evenings  Orders: T- Hemoglobin A1C ([id number]) T- Hemoglobin A1C ([id number]) T-Urine Microalbumin w/creat. ratio [phone])  Labs Reviewed: Creat: 0.76 (03/23/2009)   Microalbumin: 0.20 (02/17/2007) Reviewed HgBA1c results: 8.2 (01/16/2009)  7.8 (10/08/2008)  Problem # 4:  HYPERLIPIDEMIA (ICD-272.4) Assessment: Unchanged  Her updated medication list for this problem includes:    Simvastatin 80 Mg Tabs (Simvastatin) ..... One tab by mouth qhs  Orders: T-Hepatic Function [phone]) T-Lipid Profile [id number])  Labs Reviewed: SGOT: 17 (03/23/2009)   SGPT: 14 (03/23/2009)   HDL:51 (03/23/2009), 61 (06/22/2008)  LDL:73 (03/23/2009), 84 (06/22/2008)  Chol:133 (03/23/2009), 156 (06/22/2008)  Trig:46 (03/23/2009), 53 (06/22/2008)  Complete Medication List: 1)  Glyburide-metformin 5-500 Mg Tabs (Glyburide-metformin) .... Take two tablets by mouth twice a ay 2)  Benazepril-hydrochlorothiazide 20-12.5 Mg Tabs (Benazepril-hydrochlorothiazide) .... Take two tablets by mouth in the mornings 3)  Humalog Mix 75/25 75-25 % Susp (Insulin lispro prot & lispro) .... Inject 27 units subcutaneously in the mornings and 20 units subcutaneously in the  evenings 4)  Freestyle Lancets Misc (Lancets) .... Three times a day testing 5)  Simvastatin 80 Mg Tabs (Simvastatin) .... One tab by mouth qhs 6)  Bd Ultra-fine Pen Needles 29g X 12.13mm Misc (Insulin pen needle) .... Uad 7)  Cataflam 50 Mg Tabs (Diclofenac potassium) .Marland Kitchen.. 1 by mouth q 12  Other Orders: T-CBC w/Diff [id number]) T-Vitamin D (25-Hydroxy) [id number]) T-TSH [id number]) T-Basic Metabolic Panel [id number])  Patient Instructions: 1)  cPE in mid July. 2)  TSH prior to visit, ICD-9: 3)  CBC w/ Diff prior to visit, ICD-9: 4)  HbgA1C prior to visit, ICD-9:   today 5)  vit d  6)  hBA1c, and microalbumin in July just before appt, also o fasting lipid, hepatic and chem 7 Prescriptions: SIMVASTATIN 80 MG TABS (SIMVASTATIN) ONE TAB by mouth QHS  #90 x 3   Entered by:   Everitt Amber LPN   Authorized by:   Syliva Overman MD   Signed by:   Everitt Amber LPN on 54/27/0623   Method used:   Faxed to ...       [facility]* Environmental education officer)             ,  Ph: [phone]       Fax: [phone]   RxID:   [id number] GLYBURIDE-METFORMIN 5-500 MG  TABS (GLYBURIDE-METFORMIN) Take two tablets by mouth twice a ay  #360 x 3   Entered by:   Everitt Amber LPN   Authorized by:   Syliva Overman MD   Signed by:   Everitt Amber LPN on 38/75/6433   Method used:   Faxed to ...       [facility]* (mail-order)             ,          Ph: [phone]       Fax: [phone]   RxID:   [id number]

## 2010-04-01 NOTE — Assessment & Plan Note (Signed)
Summary: physical   Vital Signs:  Patient profile:   65 year old female Menstrual status:  hysterectomy Height:      68 inches Weight:      206.25 pounds BMI:     31.47 O2 Sat:      97 % on Room air Pulse rate:   84 / minute Pulse rhythm:   regular Resp:     16 per minute BP sitting:   130 / 70  (right arm)  Vitals Entered By: Everitt Amber LPN (September 10, 2009 8:22 AM)  Nutrition Counseling: Patient's BMI is greater than 25 and therefore counseled on weight management options.  O2 Flow:  Room air CC: CPE  Vision Screening:Left eye w/o correction: 20 / 20 Right Eye w/o correction: 20 / 25 Both eyes w/o correction:  20/ 20  Color vision testing: normal      Vision Entered By: Everitt Amber LPN (September 10, 2009 8:23 AM)   Primary Care Provider:  Syliva Overman MD  CC:  CPE.  History of Present Illness: pt c/o uncontrolled and increased back pain radiating to lower extremeties esp while she is standing on thefloor working, she states the pain is a 10 plus and she doesn't know how much longer she will be able to tolerate working. She has established stenosis and disc disease. She denies any recent fever or chills . She denies head or chest congestion. She denies uncontrolled blood sugars.    Current Medications (verified): 1)  Glyburide-Metformin 5-500 Mg  Tabs (Glyburide-Metformin) .... Take Two Tablets By Mouth Twice A Ay 2)  Benazepril-Hydrochlorothiazide 20-12.5 Mg  Tabs (Benazepril-Hydrochlorothiazide) .... Take Two Tablets By Mouth in The Mornings 3)  Simvastatin 80 Mg Tabs (Simvastatin) .... One Tab By Mouth Qhs 4)  Bd Ultra-Fine Pen Needles 29g X 12.20mm Misc (Insulin Pen Needle) .... Uad 5)  Cataflam 50 Mg Tabs (Diclofenac Potassium) .Marland Kitchen.. 1 By Mouth Q 12 6)  Truetrack Smart System  Kit (Blood Glucose Monitoring Suppl) .... Two Times A Day Testing 7)  Truetrack Test  Strp (Glucose Blood) .... Two Times A Day Testing 8)  Truetrack Blood Glucose  Devi (Blood Glucose  Monitoring Suppl) .... Two Times A Day Testing 9)  Bd Insulin Syr Ultrafine Ii 31g X 5/16" 1 Ml Misc (Insulin Syringe-Needle U-100) .... Two Times A Day Use 10)  Ultram 50 Mg Tabs (Tramadol Hcl) .Marland Kitchen.. 1 Q 4 Hrs As Needed 11)  Novolog Mix 70/30 Flexpen 70-30 % Susp (Insulin Aspart Prot & Aspart) .... 27 Units in The Morning and 20 Units in The Evening  Allergies (verified): No Known Drug Allergies  Review of Systems      See HPI General:  Complains of fatigue. Eyes:  Denies discharge and red eye. ENT:  Denies hoarseness, nasal congestion, sinus pressure, and sore throat. CV:  Denies chest pain or discomfort, palpitations, and swelling of feet. Resp:  Denies cough and sputum productive. GI:  Denies abdominal pain, constipation, diarrhea, nausea, and vomiting. GU:  Denies genital sores, urinary frequency, and urinary hesitancy. MS:  Complains of low back pain, mid back pain, and muscle weakness; low back pain radiating to both lower ext increased severity. Derm:  Denies itching and rash. Neuro:  Denies headaches, seizures, and sensation of room spinning. Psych:  Denies anxiety and depression. Endo:  Denies cold intolerance, excessive hunger, excessive thirst, excessive urination, heat intolerance, polyuria, and weight change. Heme:  Denies abnormal bruising and bleeding. Allergy:  Denies hives or rash and itching eyes.  Physical Exam  General:  Well-developed,well-nourished,in no acute distress; alert,appropriate and cooperative throughout examination Head:  Normocephalic and atraumatic without obvious abnormalities. No apparent alopecia or balding. Eyes:  No corneal or conjunctival inflammation noted. EOMI. Perrla. Funduscopic exam benign, without hemorrhages, exudates or papilledema. Vision grossly normal. Ears:  External ear exam shows no significant lesions or deformities.  Otoscopic examination reveals bilateral cerumen impactiondischarge. Hearing is grossly normal bilaterally. Nose:   no external deformity and no external erythema.   Mouth:  pharynx pink and moist and fair dentition.   Neck:  No deformities, masses, or tenderness noted. Chest Wall:  No deformities, masses, or tenderness noted. Breasts:  No mass, nodules, thickening, tenderness, bulging, retraction, inflamation, nipple discharge or skin changes noted.   Lungs:  Normal respiratory effort, chest expands symmetrically. Lungs are clear to auscultation, no crackles or wheezes. Heart:  Normal rate and regular rhythm. S1 and S2 normal without gallop, murmur, click, rub or other extra sounds. Abdomen:  Bowel sounds positive,abdomen soft and non-tender without masses, organomegaly or hernias noted. Rectal:  No external abnormalities noted. Normal sphincter tone. No rectal masses or tenderness.guaic negf stool Genitalia:  normal introitus, no external lesions, no vaginal atrophy, and no adnexal masses or tenderness.   Uterus absent Msk:  No deformity or scoliosis noted of thoracic or lumbar spine.   Pulses:  R and L carotid,radial,femoral,dorsalis pedis and posterior tibial pulses are full and equal bilaterally Extremities:  decvreased ROM spine , adequate in hips and knees. Neurologic:  No cranial nerve deficits noted. Station and gait are normal. Plantar reflexes are down-going bilaterally. DTRs are symmetrical throughout. Sensory, motor and coordinative functions appear intact. Skin:  Intact without suspicious lesions or rashes Cervical Nodes:  No lymphadenopathy noted Axillary Nodes:  No palpable lymphadenopathy Inguinal Nodes:  No significant adenopathy Psych:  Cognition and judgment appear intact. Alert and cooperative with normal attention span and concentration. No apparent delusions, illusions, hallucinations   Impression & Recommendations:  Problem # 1:  SPECIAL SCREENING FOR MALIGNANT NEOPLASMS COLON (ICD-V76.51) Assessment Comment Only  Orders: Hemoccult Guaiac-1 spec.(in office) (82270),  negative  Problem # 2:  SCREENING FOR MALIGNANT NEOPLASM OF THE CERVIX (ICD-V76.2) Assessment: Comment Only  Orders: Pap Smear ([address])  Problem # 3:  BACK PAIN WITH RADICULOPATHY (ICD-729.2) Assessment: Deteriorated  Orders: Radiology Referral (Radiology)  Problem # 4:  OBESITY, UNSPECIFIED (ICD-278.00) Assessment: Unchanged  Ht: 68 (09/10/2009)   Wt: 206.25 (09/10/2009)   BMI: 31.47 (09/10/2009)  Problem # 5:  ESSENTIAL HYPERTENSION, BENIGN (ICD-401.1) Assessment: Unchanged  Her updated medication list for this problem includes:    Benazepril-hydrochlorothiazide 20-12.5 Mg Tabs (Benazepril-hydrochlorothiazide) .Marland Kitchen... Take two tablets by mouth in the mornings  Orders: T-Basic Metabolic Panel [id number])  BP today: 130/70 Prior BP: 120/74 (05/09/2009)  Labs Reviewed: K+: 4.4 (08/24/2009) Creat: : 0.85 (08/24/2009)   Chol: 160 (08/24/2009)   HDL: 55 (08/24/2009)   LDL: 97 (08/24/2009)   TG: 42 (08/24/2009)  Problem # 6:  IDDM (ICD-250.01) Assessment: Improved  Her updated medication list for this problem includes:    Glyburide-metformin 5-500 Mg Tabs (Glyburide-metformin) .Marland Kitchen... Take two tablets by mouth twice a ay    Benazepril-hydrochlorothiazide 20-12.5 Mg Tabs (Benazepril-hydrochlorothiazide) .Marland Kitchen... Take two tablets by mouth in the mornings    Novolog Mix 70/30 Flexpen 70-30 % Susp (Insulin aspart prot & aspart) .Marland Kitchen... 27 units in the morning and 20 units in the evening  Orders: T- Hemoglobin A1C ([id number])  Labs Reviewed: Creat: 0.85 (08/24/2009)   Microalbumin: 0.20 (02/17/2007)  Reviewed HgBA1c results: 6.2 (08/24/2009)  7.1 (05/09/2009)  Complete Medication List: 1)  Glyburide-metformin 5-500 Mg Tabs (Glyburide-metformin) .... Take two tablets by mouth twice a ay 2)  Benazepril-hydrochlorothiazide 20-12.5 Mg Tabs (Benazepril-hydrochlorothiazide) .... Take two tablets by mouth in the mornings 3)  Simvastatin 80 Mg Tabs (Simvastatin) .... One tab by mouth  qhs 4)  Bd Ultra-fine Pen Needles 29g X 12.48mm Misc (Insulin pen needle) .... Uad 5)  Cataflam 50 Mg Tabs (Diclofenac potassium) .Marland Kitchen.. 1 by mouth q 12 6)  Truetrack Smart System Kit (Blood glucose monitoring suppl) .... Two times a day testing 7)  Truetrack Test Strp (Glucose blood) .... Two times a day testing 8)  Truetrack Blood Glucose Devi (Blood glucose monitoring suppl) .... Two times a day testing 9)  Bd Insulin Syr Ultrafine Ii 31g X 5/16" 1 Ml Misc (Insulin syringe-needle u-100) .... Two times a day use 10)  Ultram 50 Mg Tabs (Tramadol hcl) .Marland Kitchen.. 1 q 4 hrs as needed 11)  Novolog Mix 70/30 Flexpen 70-30 % Susp (Insulin aspart prot & aspart) .... 27 units in the morning and 20 units in the evening 12)  True Track Lancets  .... For two times a day testing  Patient Instructions: 1)  Please schedule a follow-up appointment in 3.5 months. 2)  nurse visit for ear flush bilateral in 1 to 2 weeks 3)  It is important that you exercise regularly at least 20 minutes 5 times a week. If you develop chest pain, have severe difficulty breathing, or feel very tired , stop exercising immediately and seek medical attention. 4)  You need to lose weight. Consider a lower calorie diet and regular exercise.  5)  Your blood pressure, blood sugar and cholesterol are great, no med changes at this time. 6)  You are being referred for a bone density test, also for a scan of your low back . 7)  Start calc/vitD 1200mg /1000IU one daily pls Prescriptions: SIMVASTATIN 80 MG TABS (SIMVASTATIN) ONE TAB by mouth QHS  #90 x 2   Entered by:   Everitt Amber LPN   Authorized by:   Syliva Overman MD   Signed by:   Everitt Amber LPN on 40/34/7425   Method used:   Electronically to        [facility]* (retail)       [address]       [address] Shipping/mailing       [place], [place]  [address]       Ph: [phone]       Fax: [phone]   RxID:   [id number] BENAZEPRIL-HYDROCHLOROTHIAZIDE  20-12.5 MG  TABS (BENAZEPRIL-HYDROCHLOROTHIAZIDE) Take two tablets by mouth in the mornings  #180 x 2   Entered by:   Everitt Amber LPN   Authorized by:   Syliva Overman MD   Signed by:   Everitt Amber LPN on 25/42/7062   Method used:   Electronically to        [facility]* (retail)       [address]       [address] Shipping/mailing       [place], [place]  [address]       Ph: [phone]       Fax: [phone]   RxID:   [id number] GLYBURIDE-METFORMIN 5-500 MG  TABS (GLYBURIDE-METFORMIN) Take two tablets by mouth twice a ay  #360 x 2   Entered by:   Everitt Amber LPN  Authorized by:   Syliva Overman MD   Signed by:   Everitt Amber LPN on 16/12/9602   Method used:   Electronically to        [facility]* (retail)       [address]       [address] [place] [address]mailing       [place], [place]  [address]       Ph: [phone]       Fax: [phone]   RxID:   [id number] TRUE TRACK LANCETS for two times a day testing  #66months x 3   Entered by:   Everitt Amber LPN   Authorized by:   Syliva Overman MD   Signed by:   Everitt Amber LPN on 12/27/2534   Method used:   Printed then faxed to ...       [facility]* (retail)       [address]       [address] Shipping/mailing       [place], [place]  [address]       Ph: [phone]       Fax: [phone]   RxID:   [id number] TRUETRACK TEST  STRP (GLUCOSE BLOOD) two times a day TESTING  #3 MNTH SUPP x 3   Entered by:   Everitt Amber LPN   Authorized by:   Syliva Overman MD   Signed by:   Everitt Amber LPN on 32/35/5732   Method used:   Electronically to        [facility]* (retail)       [address]       [address] Shipping/mailing       [place], [place]  [address]       Ph: [phone]       Fax: [phone]   RxID:   [id number]     Laboratory Results    Stool - Occult Blood Hemmoccult #1: negative Date:  09/10/2009 Comments: [id number] 9R 8/11 118 1012

## 2010-04-01 NOTE — Letter (Signed)
Summary: office notes from Dr. Eduard Clos  office notes from Dr. Eduard Clos   Imported By: Curtis Sites 10/03/2009 13:53:10  _____________________________________________________________________  External Attachment:    Type:   Image     Comment:   External Document

## 2010-04-01 NOTE — Assessment & Plan Note (Signed)
Summary: office visit   Vital Signs:  Patient profile:   65 year old female Menstrual status:  hysterectomy Height:      68 inches Weight:      217.75 pounds BMI:     33.23 O2 Sat:      98 % on Room air Pulse rate:   91 / minute Pulse rhythm:   regular Resp:     16 per minute BP sitting:   122 / 80  (left arm)  Vitals Entered By: Mauricia Area CMA (January 01, 2010 8:46 AM)  Nutrition Counseling: Patient's BMI is greater than 25 and therefore counseled on weight management options.  O2 Flow:  Room air CC: Follow up: quads on both legs painful, tingling, burning, numbness radiates from gluteus down to calf. Left side hurts   Primary Care Provider:  Syliva Overman MD  CC:  Follow up: quads on both legs painful, tingling, burning, and numbness radiates from gluteus down to calf. Left side hurts.  History of Present Illness: Reports  that she has not been doing very well. She continues to experience significant back pain with radiculopathy. Initially , she had responded extremely well to epidural, but this ois no longer the case. No interest in surgery. Finds gabapentin useful to some extent. Denies recent fever or chills. Denies sinus pressure, nasal congestion , ear pain or sore throat. Denies chest congestion, or cough productive of sputum. Denies chest pain, palpitations, PND, orthopnea or leg swelling. Denies abdominal pain, nausea, vomitting, diarrhea or constipation. Denies change in bowel movements or bloody stool. Denies dysuria , frequency, incontinence or hesitancy.  Denies headaches, vertigo, seizures. Denies depression, anxiety or insomnia. Denies  rash, lesions, or itch.     Current Medications (verified): 1)  Glyburide-Metformin 5-500 Mg  Tabs (Glyburide-Metformin) .... Take Two Tablets By Mouth Twice A Ay 2)  Benazepril-Hydrochlorothiazide 20-12.5 Mg  Tabs (Benazepril-Hydrochlorothiazide) .... Take Two Tablets By Mouth in The Mornings 3)  Simvastatin 80  Mg Tabs (Simvastatin) .... 1/2 Tab By Mouth At Bedtime. 4)  Bd Ultra-Fine Pen Needles 29g X 12.69mm Misc (Insulin Pen Needle) .... Use As Directed 5)  Truetrack Smart System  Kit (Blood Glucose Monitoring Suppl) .... Two Times A Day Testing 6)  Truetrack Test  Strp (Glucose Blood) .... Two Times A Day Testing 7)  Truetrack Blood Glucose  Devi (Blood Glucose Monitoring Suppl) .... Two Times A Day Testing 8)  Bd Insulin Syr Ultrafine Ii 31g X 5/16" 1 Ml Misc (Insulin Syringe-Needle U-100) .... Two Times A Day Use 9)  Novolog Mix 70/30 Flexpen 70-30 % Susp (Insulin Aspart Prot & Aspart) .... 27 Units in The Morning and 20 Units in The Evening 10)  True Track Lancets .... For Two Times A Day Testing 11)  Calcium Plus Vitamin D3 .... 2 Tab Daily  Allergies (verified): No Known Drug Allergies  Review of Systems      See HPI General:  Complains of fatigue. Eyes:  Denies discharge, eye pain, and red eye. MS:  numbness tingling and burning and num,bness in buttocks which often rasdiates to post thighs, sometimes as far as feet and ankles, worse after standing for long hours. Endo:  Denies excessive thirst and excessive urination; tests regularly and sugars are often less than 120 fasting. Heme:  Denies abnormal bruising and bleeding. Allergy:  Denies hives or rash and itching eyes.  Physical Exam  General:  Well-developed,well-nourished,in no acute distress; alert,appropriate and cooperative throughout examination HEENT: No facial asymmetry,  EOMI,  No sinus tenderness, TM's Clear, oropharynx  pink and moist.   Chest: Clear to auscultation bilaterally.  CVS: S1, S2, No murmurs, No S3.   Abd: Soft, Nontender.  MS: decreased  ROM spine,adequate in  hips, shoulders and knees.  Ext: No edema.   CNS: CN 2-12 intact, power tone and sensation normal throughout.   Skin: Intact, no visible lesions or rashes.  Psych: Good eye contact, normal affect.  Memory intact, not anxious or depressed  appearing.   Diabetes Management Exam:    Foot Exam (with socks and/or shoes not present):       Sensory-Monofilament:          Left foot: diminished          Right foot: diminished       Inspection:          Left foot: normal          Right foot: normal       Nails:          Left foot: normal          Right foot: normal   Impression & Recommendations:  Problem # 1:  BACK PAIN WITH RADICULOPATHY (ICD-729.2) Assessment Unchanged pt to resume gabapentin  Problem # 2:  HYPERLIPIDEMIA (ICD-272.4) Assessment: Comment Only  The following medications were removed from the medication list:    Simvastatin 80 Mg Tabs (Simvastatin) ..... One tab by mouth qhs Her updated medication list for this problem includes:    Simvastatin 40 Mg Tabs (Simvastatin) .Marland Kitchen... Take 1 tab by mouth at bedtime  Orders: T-Lipid Profile ([id number]) T-Hepatic Function [phone]) Low fat dietdiscussed and encouraged  Labs Reviewed: SGOT: 20 (08/24/2009)   SGPT: 18 (08/24/2009)   HDL:55 (08/24/2009), 51 (03/23/2009)  LDL:97 (08/24/2009), 73 (03/23/2009)  Chol:160 (08/24/2009), 133 (03/23/2009)  Trig:42 (08/24/2009), 46 (03/23/2009)  Problem # 3:  ESSENTIAL HYPERTENSION, BENIGN (ICD-401.1) Assessment: Improved  Her updated medication list for this problem includes:    Benazepril-hydrochlorothiazide 20-12.5 Mg Tabs (Benazepril-hydrochlorothiazide) .Marland Kitchen... Take two tablets by mouth in the mornings  Orders: T-Basic Metabolic Panel [phone])  BP today: 122/80 Prior BP: 130/70 (09/10/2009)  Labs Reviewed: K+: 4.5 (12/21/2009) Creat: : 0.77 (12/21/2009)   Chol: 160 (08/24/2009)   HDL: 55 (08/24/2009)   LDL: 97 (08/24/2009)   TG: 42 (08/24/2009)  Problem # 4:  IDDM (ICD-250.01) Assessment: Deteriorated  Her updated medication list for this problem includes:    Glyburide-metformin 5-500 Mg Tabs (Glyburide-metformin) .Marland Kitchen... Take two tablets by mouth twice a ay    Benazepril-hydrochlorothiazide  20-12.5 Mg Tabs (Benazepril-hydrochlorothiazide) .Marland Kitchen... Take two tablets by mouth in the mornings    Novolog Mix 70/30 Flexpen 70-30 % Susp (Insulin aspart prot & aspart) .Marland Kitchen... 27 units in the morning and 20 units in the evening  Orders: T- Hemoglobin A1C ([id number]) pt has had epidurals Labs Reviewed: Creat: 0.77 (12/21/2009)   Microalbumin: 0.20 (02/17/2007) Reviewed HgBA1c results: 6.9 (12/21/2009)  6.2 (08/24/2009)  Complete Medication List: 1)  Glyburide-metformin 5-500 Mg Tabs (Glyburide-metformin) .... Take two tablets by mouth twice a ay 2)  Benazepril-hydrochlorothiazide 20-12.5 Mg Tabs (Benazepril-hydrochlorothiazide) .... Take two tablets by mouth in the mornings 3)  Bd Ultra-fine Pen Needles 29g X 12.78mm Misc (Insulin pen needle) .... Use as directed 4)  Truetrack Smart System Kit (Blood glucose monitoring suppl) .... Two times a day testing 5)  Truetrack Test Strp (Glucose blood) .... Two times a day testing 6)  Truetrack Blood Glucose Devi (Blood glucose monitoring suppl) .... Two times  a day testing 7)  Bd Insulin Syr Ultrafine Ii 31g X 5/16" 1 Ml Misc (Insulin syringe-needle u-100) .... Two times a day use 8)  Novolog Mix 70/30 Flexpen 70-30 % Susp (Insulin aspart prot & aspart) .... 27 units in the morning and 20 units in the evening 9)  True Track Lancets  .... For two times a day testing 10)  Calcium Plus Vitamin D3  .... 2 tab daily 11)  Gabapentin 300 Mg Caps (Gabapentin) .... Take 1 capsule by mouth at bedtime , for 2 weeks , if still having alot of pain increase to 2 capsules at bedtime 12)  Simvastatin 40 Mg Tabs (Simvastatin) .... Take 1 tab by mouth at bedtime  Patient Instructions: 1)  Please schedule a follow-up appointment in 4 months. 2)  It is important that you exercise regularly at least 20 minutes 5 times a week. If you develop chest pain, have severe difficulty breathing, or feel very tired , stop exercising immediately and seek medical attention. 3)   You need to lose weight. Consider a lower calorie diet and regular exercise.  4)  Your blood sugar is good and kidney function is good. 5)  No med changes , except restart the gabapentin as discussed for the tingling  Prescriptions: SIMVASTATIN 40 MG TABS (SIMVASTATIN) Take 1 tab by mouth at bedtime  #90 x 3   Entered and Authorized by:   Syliva Overman MD   Signed by:   Syliva Overman MD on 01/01/2010   Method used:   Printed then faxed to ...       [facility]* (retail)       [address]       [address] Shipping/mailing       [place], [place]  [address]       Ph: [phone]       Fax: [phone]   RxID:   [id number]    Orders Added: 1)  Est. Patient Level IV [84132] 2)  T-Lipid Profile [80061-22930] 3)  T-Hepatic Function [80076-22960] 4)  T-Basic Metabolic Panel [80048-22910] 5)  T- Hemoglobin A1C [83036-23375]

## 2010-04-01 NOTE — Letter (Signed)
Summary: [facility]   Imported By: Lind Guest 11/14/2009 08:46:27  _____________________________________________________________________  External Attachment:    Type:   Image     Comment:   External Document

## 2010-04-01 NOTE — Assessment & Plan Note (Signed)
Summary: 6 M RE-CK/XRAY RT KNEE/UMR/CAF   Visit Type:  Follow-up Primary Provider:  Syliva Overman MD  CC:  right knee pain.  History of Present Illness: c/o stiffness and soreness   Xrays today.  Medications: Vitamins, Oscal, medication list is correct.  Diclofenac two times a day Tamadol: script ran out   pain mild has stiffness and swelling   ROS: MSK no other complaints   Current Medications (verified): 1)  Glyburide-Metformin 5-500 Mg  Tabs (Glyburide-Metformin) .... Take Two Tablets By Mouth Twice A Ay 2)  Benazepril-Hydrochlorothiazide 20-12.5 Mg  Tabs (Benazepril-Hydrochlorothiazide) .... Take Two Tablets By Mouth in The Mornings 3)  Humalog Mix 75/25 75-25 %  Susp (Insulin Lispro Prot & Lispro) .... Inject 27 Units Subcutaneously in The Mornings and 20 Units Subcutaneously in The Evenings 4)  Freestyle Lancets  Misc (Lancets) .... Three Times A Day Testing 5)  Simvastatin 80 Mg Tabs (Simvastatin) .... One Tab By Mouth Qhs 6)  Bd Ultra-Fine Pen Needles 29g X 12.11mm Misc (Insulin Pen Needle) .... Uad 7)  Cataflam 50 Mg Tabs (Diclofenac Potassium) .Marland Kitchen.. 1 By Mouth Q 12 8)  Truetrack Smart System  Kit (Blood Glucose Monitoring Suppl) .... Two Times A Day Testing 9)  Truetrack Test  Strp (Glucose Blood) .... Two Times A Day Testing 10)  Truetrack Blood Glucose  Devi (Blood Glucose Monitoring Suppl) .... Two Times A Day Testing 11)  Bd Insulin Syr Ultrafine Ii 31g X 5/16" 1 Ml Misc (Insulin Syringe-Needle U-100) .... Two Times A Day Use  Allergies (verified): No Known Drug Allergies  Past History:  Past Medical History: Last updated: 04/04/2007   DEGENERATIVE JOINT DISEASE, KNEES, BILATERAL (ICD-715.96) KNEE PAIN (ICD-719.46) OBESITY, UNSPECIFIED (ICD-278.00) HYPERLIPIDEMIA (ICD-272.4) ESSENTIAL HYPERTENSION, BENIGN (ICD-401.1) IDDM (ICD-250.01)  Past Surgical History: Last updated: 01/16/2009 Hysterectomy total in 1970 Cholecystectomy 11/2008  Family  History: Last updated: Apr 04, 2007 Mother- deceased- diabetes, hypertension and heart failure Father- deceased- Prostate and Colon Cancer 2 sisters living- hypertension- 1 diabetic 2 brothers -diabetes and coronary artery disease, HTN  Social History: Last updated: 04-04-07 Single Former Smoker Alcohol use-no Drug use-no employed [employer]  Risk Factors: Smoking Status: quit > 6 months (01/16/2009)  Physical Exam  Additional Exam:    GENERAL: Appearance is normal   CDV: normal pulse and temperature   Skin: was normal   Neuro: sensation was normal   MSKright kee   small effusion lateral tenderness mild  30-90 degrees  *MOTOR: 5/5  *Stability no joint laxity       Impression & Recommendations:  Problem # 1:  PRIMARY LOCALIZED OSTEOARTHROSIS LOWER LEG (ICD-715.16) Assessment Unchanged  ordered 3 v right knee   mod latearl joint space narrowing  osteophytes   IMPR: moderate valgus OA   Orders: Est. Patient Level III ([address]) Knee x-ray,  3 views ([address])  Medications Added to Medication List This Visit: 1)  Ultram 50 Mg Tabs (Tramadol hcl) .Marland Kitchen.. 1 q 4 hrs as needed  Patient Instructions: 1)  Continue diclofenac 2)  Resume tramadol  3)  return in 6 mos xrays  Prescriptions: ULTRAM 50 MG TABS (TRAMADOL HCL) 1 q 4 hrs as needed  #60 x 5   Entered and Authorized by:   Fuller Canada MD   Signed by:   Fuller Canada MD on 05/20/2009   Method used:   Print then Give to Patient   RxID:   [id number] ULTRAM 50 MG TABS (TRAMADOL HCL) 1 q 4 hrs as needed  #60 x 5  Entered and Authorized by:   Fuller Canada MD   Signed by:   Fuller Canada MD on 05/20/2009   Method used:   Print then Give to Patient   RxID:   [id number]

## 2010-04-01 NOTE — Medication Information (Signed)
Summary: Tax adviser   Imported By: Lind Guest 04/25/2009 08:45:15  _____________________________________________________________________  External Attachment:    Type:   Image     Comment:   External Document

## 2010-04-01 NOTE — Progress Notes (Signed)
  Phone Note Other Incoming   Caller: med link Summary of Call: chabnge from humalog to novolg for zero co pay, this is fine I will send in the new script pls write change then fax and notify pt also Initial call taken by: Syliva Overman MD,  Jul 22, 2009 7:43 AM  Follow-up for Phone Call        rx sent, called patient, unavailable Follow-up by: Adella Hare LPN,  Jul 22, 2009 1:29 PM    New/Updated Medications: NOVOLOG MIX 70/30 FLEXPEN 70-30 % SUSP (INSULIN ASPART PROT & ASPART) 27 units in the morning and 20 units in the evening Prescriptions: NOVOLOG MIX 70/30 FLEXPEN 70-30 % SUSP (INSULIN ASPART PROT & ASPART) 27 units in the morning and 20 units in the evening  #4500 units x 3   Entered and Authorized by:   Syliva Overman MD   Signed by:   Syliva Overman MD on 07/22/2009   Method used:   Printed then faxed to ...       [facility]* (retail)       [address]       [address] Shipping/mailing       [place], [place]  [address]       Ph: [phone]       Fax: [phone]   RxID:   [id number]

## 2010-04-01 NOTE — Miscellaneous (Signed)
Summary: Refill  Clinical Lists Changes  Medications: Changed medication from HUMALOG MIX 75/25 75-25 %  SUSP (INSULIN LISPRO PROT & LISPRO) Inject 27 units Subcutaneously in the mornings and 20 units Subcutaneously in the evenings to HUMALOG MIX 75/25 75-25 % SUSP (INSULIN LISPRO PROT & LISPRO) 27 units in the morning and 20 units in the evening - Signed Changed medication from HUMALOG MIX 75/25 75-25 % SUSP (INSULIN LISPRO PROT & LISPRO) 27 units in the morning and 20 units in the evening to HUMALOG MIX 75/25 KWIKPEN 75-25 % SUSP (INSULIN LISPRO PROT & LISPRO) 27 units in the am and 20 units in the pm - Signed Rx of HUMALOG MIX 75/25 75-25 % SUSP (INSULIN LISPRO PROT & LISPRO) 27 units in the morning and 20 units in the evening;  #1 month x 3;  Signed;  Entered by: Everitt Amber LPN;  Authorized by: Syliva Overman MD;  Method used: Electronically to [facility]*, [address], [address] Shipping/mailing, [place], [place]  [address], Ph: [phone], Fax: [phone] Rx of HUMALOG MIX 75/25 KWIKPEN 75-25 % SUSP (INSULIN LISPRO PROT & LISPRO) 27 units in the am and 20 units in the pm;  #1 month x 3;  Signed;  Entered by: Everitt Amber LPN;  Authorized by: Syliva Overman MD;  Method used: Electronically to [facility]*, [address] Shipping/mailing, [place], [place]  [address], Ph: [phone], Fax: [phone]    Prescriptions: HUMALOG MIX 75/25 KWIKPEN 75-25 % SUSP (INSULIN LISPRO PROT & LISPRO) 27 units in the am and 20 units in the pm  #1 month x 3   Entered by:   Everitt Amber LPN   Authorized by:   Syliva Overman MD   Signed by:   Everitt Amber LPN on 02/72/5366   Method used:   Electronically to        [facility]* (retail)       [address]       [address] Shipping/mailing       [place], [place]  [address]       Ph: [phone]       Fax: [phone]   RxID:   [id number] HUMALOG MIX 75/25 75-25 %  SUSP (INSULIN LISPRO PROT & LISPRO) 27 units in the morning and 20 units in the evening  #1 month x 3   Entered by:   Everitt Amber LPN   Authorized by:   Syliva Overman MD   Signed by:   Everitt Amber LPN on 23/55/7322   Method used:   Electronically to        [facility]* (retail)       [address]       [address] Shipping/mailing       [place], [place]  [address]       Ph: [phone]       Fax: [phone]   RxID:   [id number]

## 2010-04-01 NOTE — Assessment & Plan Note (Signed)
Summary: Stacey Hanson  Nurse Visit  Comments patient in today for bilateral ear irrigation. both ears cleared. pt tolerated procedure well. Adella Hare LPN  September 23, 2009 10:28 AM     Allergies: No Known Drug Allergies  Orders Added: 1)  Cerumen Impaction Removal [69210]

## 2010-04-01 NOTE — Progress Notes (Signed)
Summary: DR. Eduard Clos  DR. Eduard Clos   Imported By: Lind Guest 10/09/2009 13:31:53  _____________________________________________________________________  External Attachment:    Type:   Image     Comment:   External Document

## 2010-04-01 NOTE — Letter (Signed)
Summary: Letter  Letter   Imported By: Lind Guest 03/25/2009 14:04:56  _____________________________________________________________________  External Attachment:    Type:   Image     Comment:   External Document

## 2010-04-01 NOTE — Letter (Signed)
Summary: HANDICAPP CARD  HANDICAPP CARD   Imported By: Lind Guest 11/12/2009 10:06:00  _____________________________________________________________________  External Attachment:    Type:   Image     Comment:   External Document

## 2010-04-01 NOTE — Letter (Signed)
Summary: Letter  Letter   Imported By: Lind Guest 09/18/2009 08:42:28  _____________________________________________________________________  External Attachment:    Type:   Image     Comment:   External Document

## 2010-04-01 NOTE — Assessment & Plan Note (Signed)
Summary: 6 MO XR RT KNEE/UMR/BSF   Visit Type:  Follow-up Primary Provider:  Syliva Overman MD  CC:  right knee pain.  History of Present Illness: I saw Stacey Hanson in the office today for a 6 month followup visit.  She is a 65 years old woman comes in for her 6 month reevaluation and x-rays of RIGHT knee for diagnosis of osteoarthritis  Xrays today.  Medication added last visit: Ultram 50 Mg Tabs (Tramadol hcl) .Marland Kitchen.. 1 q 4 hrs as needed, she does not need it now.  Knee pain is gone better after epidural injections for lumbar disc condition. Currently not having any pain or taking any medicines for her knee arthritis.  Exam shows mild tenderness in the lateral compartment free range of motion. Her knee remained stable.  X-rays were obtained showed mild progression of arthritis.  However, she is improving or has improved with injection in her back. Her knees finds on the knee to surgically intervene at this point. Followup 6 months     Current Medications (verified): 1)  Glyburide-Metformin 5-500 Mg  Tabs (Glyburide-Metformin) .... Take Two Tablets By Mouth Twice A Ay 2)  Benazepril-Hydrochlorothiazide 20-12.5 Mg  Tabs (Benazepril-Hydrochlorothiazide) .... Take Two Tablets By Mouth in The Mornings 3)  Simvastatin 80 Mg Tabs (Simvastatin) .... One Tab By Mouth Qhs 4)  Bd Ultra-Fine Pen Needles 29g X 12.24mm Misc (Insulin Pen Needle) .... Uad 5)  Cataflam 50 Mg Tabs (Diclofenac Potassium) .Marland Kitchen.. 1 By Mouth Q 12 6)  Truetrack Smart System  Kit (Blood Glucose Monitoring Suppl) .... Two Times A Day Testing 7)  Truetrack Test  Strp (Glucose Blood) .... Two Times A Day Testing 8)  Truetrack Blood Glucose  Devi (Blood Glucose Monitoring Suppl) .... Two Times A Day Testing 9)  Bd Insulin Syr Ultrafine Ii 31g X 5/16" 1 Ml Misc (Insulin Syringe-Needle U-100) .... Two Times A Day Use 10)  Ultram 50 Mg Tabs (Tramadol Hcl) .Marland Kitchen.. 1 Q 4 Hrs As Needed 11)  Novolog Mix 70/30 Flexpen 70-30 % Susp  (Insulin Aspart Prot & Aspart) .... 27 Units in The Morning and 20 Units in The Evening 12)  True Track Lancets .... For Two Times A Day Testing  Allergies (verified): No Known Drug Allergies  Past History:  Past Medical History: Last updated: 03/31/2007   DEGENERATIVE JOINT DISEASE, KNEES, BILATERAL (ICD-715.96) KNEE PAIN (ICD-719.46) OBESITY, UNSPECIFIED (ICD-278.00) HYPERLIPIDEMIA (ICD-272.4) ESSENTIAL HYPERTENSION, BENIGN (ICD-401.1) IDDM (ICD-250.01)  Past Surgical History: Last updated: 01/16/2009 Hysterectomy total in 1970 Cholecystectomy 11/2008  Review of Systems Musculoskeletal:  back pain improved .  Physical Exam  Msk:  The patient is well developed and nourished, with normal grooming and hygiene. The body habitus is  normal  GAIT: abnormal 2nd to OA and Lumbar disease  Pulses:  pulses normal Skin:  intact without lesions or rashes Psych:  alert and cooperative; normal mood and affect; normal attention span and concentration   Impression & Recommendations:  Problem # 1:  PRIMARY LOCALIZED OSTEOARTHROSIS LOWER LEG (ICD-715.16) Assessment Improved  Three views of the knee  The joint spaces are narrowed laterally with cyst formation and increased joint space narrowing compared to film done in March. There is some mild osteophytes and some patellofemoral arthritis as well. Overall alignment is slight valgus.  IMPR: valgus osteoarthritis, with mild progression  Orders: Est. Patient Level III ([address]) Knee x-ray,  3 views ([address])  Problem # 2:  BACK PAIN WITH RADICULOPATHY (ICD-729.2) Assessment: Improved  MRI was done for  her back she got epidural injections in her knee pain improved  Orders: Est. Patient Level III ([address])  Patient Instructions: 1)  Please schedule a follow-up appointment in 6 months. 2)  RT KNEE XRAYS

## 2010-04-01 NOTE — Progress Notes (Signed)
Summary: refill  Phone Note Call from Patient   Summary of Call: needs the metformin refilled at [facility].  [phone] Initial call taken by: Rudene Anda,  December 09, 2009 8:56 AM    Prescriptions: GLYBURIDE-METFORMIN 5-500 MG  TABS (GLYBURIDE-METFORMIN) Take two tablets by mouth twice a ay  #360 x 2   Entered by:   Everitt Amber LPN   Authorized by:   Syliva Overman MD   Signed by:   Everitt Amber LPN on 11/91/4782   Method used:   Electronically to        [facility]* (retail)       [address]       [address] Shipping/mailing       [place], [place]  [address]       Ph: [phone]       Fax: [phone]   RxID:   [id number]

## 2010-04-01 NOTE — Letter (Signed)
Summary: DR.BETHEA  DR.BETHEA   Imported By: Lind Guest 10/24/2009 16:19:14  _____________________________________________________________________  External Attachment:    Type:   Image     Comment:   External Document

## 2010-04-01 NOTE — Letter (Signed)
Summary: [facility]  [facility]   Imported By: Lind Guest 10/22/2009 13:49:10  _____________________________________________________________________  External Attachment:    Type:   Image     Comment:   External Document

## 2010-04-01 NOTE — Progress Notes (Signed)
Summary: REFILL  Phone Note Call from Patient   Summary of Call: NEEDS TO GET REFILLS FAXED TO [facility]. BLOOD PRESSURE. [phone]  WORK [phone] Initial call taken by: Rudene Anda,  November 27, 2009 10:01 AM    Prescriptions: BENAZEPRIL-HYDROCHLOROTHIAZIDE 20-12.5 MG  TABS (BENAZEPRIL-HYDROCHLOROTHIAZIDE) Take two tablets by mouth in the mornings  #180 x 0   Entered by:   Adella Hare LPN   Authorized by:   Syliva Overman MD   Signed by:   Adella Hare LPN on 69/62/9528   Method used:   Electronically to        [facility]* (retail)       [address]       [address] Shipping/mailing       [place], [place]  [address]       Ph: [phone]       Fax: [phone]   RxID:   [id number]

## 2010-04-03 NOTE — Progress Notes (Signed)
Summary: pain - back, buttocks, thighs, ankles and feet  Phone Note Call from Patient   Summary of Call: Patient called and advised she is hurting in back, buttocks, thighs, ankles and feet and thinks pain is associated with nerve problems in back.  Wants to be seen today, tomorrow or Monday.  Work # [phone]  Home [phone]   Initial call taken by: Doneen Poisson  Follow-up for Phone Call        no openings monday. Luann scheduled her for Wednesday at 3:45 Follow-up by: Everitt Amber LPN,  March 14, 2010 1:05 PM

## 2010-04-03 NOTE — Progress Notes (Signed)
Summary: bp medicine  Phone Note Call from Patient   Summary of Call: needs for you to call [facility] to get her bp medicine Initial call taken by: Lind Guest,  March 05, 2010 9:09 AM    Prescriptions: BENAZEPRIL-HYDROCHLOROTHIAZIDE 20-12.5 MG  TABS (BENAZEPRIL-HYDROCHLOROTHIAZIDE) Take two tablets by mouth in the mornings  #180 x 0   Entered by:   Adella Hare LPN   Authorized by:   Syliva Overman MD   Signed by:   Adella Hare LPN on 13/09/6576   Method used:   Electronically to        [facility]* (retail)       [address]       [address] Shipping/mailing       [place], [place]  [address]       Ph: [phone]       Fax: [phone]   RxID:   [id number]

## 2010-04-03 NOTE — Assessment & Plan Note (Signed)
Summary: BACK   Vital Signs:  Patient profile:   65 year old female Menstrual status:  hysterectomy Height:      68 inches Weight:      213.25 pounds BMI:     32.54 O2 Sat:      99 % Pulse rate:   103 / minute Pulse rhythm:   regular Resp:     16 per minute BP sitting:   120 / 80  (right arm)  Vitals Entered By: Everitt Amber LPN (March 18, 2010 4:34 PM)  Nutrition Counseling: Patient's BMI is greater than 25 and therefore counseled on weight management options. CC: Follow up, has bulging disc in back causing pain in her lower back down her left hip and leg.    Primary Care Provider:  Syliva Overman MD  CC:  Follow up and has bulging disc in back causing pain in her lower back down her left hip and leg. Marland Kitchen  History of Present Illness: Uncontrolled LBP radiating to legs, burning, weakness in legs. Increased symptoms in past 2 to 3 weeks. Has had epidurals last Summer which helped a bit.  Frequency and burning with urination Pt reports fatigue , poor sleep and is tearful. Reports  that  she is not doing well. Denies recent fever or chills. Denies sinus pressure, nasal congestion , ear pain or sore throat. Denies chest congestion, or cough productive of sputum. Denies chest pain, palpitations, PND, orthopnea or leg swelling. Denies abdominal pain, nausea, vomitting, diarrhea or constipation. Denies change in bowel movements or bloody stool.  Denies headaches, vertigo, seizures.  Denies  rash, lesions, or itch.     Current Medications (verified): 1)  Glyburide-Metformin 5-500 Mg  Tabs (Glyburide-Metformin) .... Take Two Tablets By Mouth Twice A Ay 2)  Benazepril-Hydrochlorothiazide 20-12.5 Mg  Tabs (Benazepril-Hydrochlorothiazide) .... Take Two Tablets By Mouth in The Mornings 3)  Bd Ultra-Fine Pen Needles 29g X 12.63mm Misc (Insulin Pen Needle) .... Use As Directed 4)  T[employer] Smart System  Kit (Blood Glucose Monitoring Suppl) .... Two Times A Day Testing 5)   Truetrack Test  Strp (Glucose Blood) .... Two Times A Day Testing 6)  T[employer]etrack Blood Glucose  Devi (Blood Glucose Monitoring Suppl) .... Two Times A Day Testing 7)  Bd Insulin Syr Ultrafine Ii 31g X 5/16" 1 Ml Misc (Insulin Syringe-Needle U-100) .... Two Times A Day Use 8)  Novolog Mix 70/30 Flexpen 70-30 % Susp (Insulin Aspart Prot & Aspart) .... 27 Units in The Morning and 20 Units in The Evening 9)  True Track Lancets .... For Two Times A Day Testing 10)  Calcium Plus Vitamin D3 .... 2 Tab Daily 11)  Gabapentin 300 Mg Caps (Gabapentin) .... Take 1 Capsule By Mouth At Bedtime , For 2 Weeks , If Still Having Alot of Pain Increase To 2 Capsules At Bedtime 12)  Simvastatin 40 Mg Tabs (Simvastatin) .... Take 1 Tab By Mouth At Bedtime 13)  Ibuprofen 200 Mg Tabs (Ibuprofen) .... As Needed  Allergies (verified): No Known Drug Allergies  Review of Systems      See HPI General:  Complains of fatigue and sleep disorder. Eyes:  Denies discharge and red eye. MS:  Complains of joint pain, low back pain, mid back pain, muscle weakness, and stiffness; worsening symptms. Neuro:  Complains of numbness and weakness; progressive lower ext weakness and numbness. Psych:  Complains of anxiety, depression, and easily tearful; denies mental problems, suicidal thoughts/plans, thoughts of violence, and unusual visions or sounds; worsening back  pain is the trigger. Endo:  Denies cold intolerance, excessive hunger, excessive thirst, excessive urination, and heat intolerance. Heme:  Denies abnormal bruising, bleeding, enlarge lymph nodes, and fevers. Allergy:  Denies hives or rash and itching eyes.  Physical Exam  General:  Well-developed,well-nourished,in no acute distress; alert,appropriate and cooperative throughout examination. Pt in pain. HEENT: No facial asymmetry,  EOMI, No sinus tenderness, TM's Clear, oropharynx  pink and moist.   Chest: Clear to auscultation bilaterally.  CVS: S1, S2, No murmurs, No  S3.   Abd: Soft, Nontender.  MS: decreased  ROM spine, adequate in hips, shoulders and knees.  Ext: No edema.   CNS: CN 2-12 intact, power tone and sensation normal throughout.   Skin: Intact, no visible lesions or rashes.  Psych: Good eye contact, normal affect.  Memory intact, not anxious or depressed appearing.   Diabetes Management Exam:    Foot Exam (with socks and/or shoes not present):       Sensory-Monofilament:          Left foot: diminished          Right foot: diminished       Inspection:          Left foot: normal          Right foot: normal       Nails:          Left foot: normal          Right foot: normal   Impression & Recommendations:  Problem # 1:  BACK PAIN WITH RADICULOPATHY (ICD-729.2) Assessment Deteriorated  Orders: Depo- Medrol 80mg  (J1040) Ketorolac-Toradol 15mg  (U0454) Admin of Therapeutic Inj  intramuscular or subcutaneous (96372)Future Orders: Pain Clinic Referral (Pain) ... 03/19/2010  Problem # 2:  ESSENTIAL HYPERTENSION, BENIGN (ICD-401.1) Assessment: Unchanged  Her updated medication list for this problem includes:    Benazepril-hydrochlorothiazide 20-12.5 Mg Tabs (Benazepril-hydrochlorothiazide) .Marland Kitchen... Take two tablets by mouth in the mornings  BP today: 120/80 Prior BP: 122/80 (01/01/2010)  Labs Reviewed: K+: 4.5 (12/21/2009) Creat: : 0.77 (12/21/2009)   Chol: 160 (08/24/2009)   HDL: 55 (08/24/2009)   LDL: 97 (08/24/2009)   TG: 42 (08/24/2009)  Problem # 3:  HYPERLIPIDEMIA (ICD-272.4) Assessment: Comment Only  Her updated medication list for this problem includes:    Simvastatin 40 Mg Tabs (Simvastatin) .Marland Kitchen... Take 1 tab by mouth at bedtime Low fat dietdiscussed and encouraged  Labs Reviewed: SGOT: 20 (08/24/2009)   SGPT: 18 (08/24/2009)   HDL:55 (08/24/2009), 51 (03/23/2009)  LDL:97 (08/24/2009), 73 (03/23/2009)  Chol:160 (08/24/2009), 133 (03/23/2009)  Trig:42 (08/24/2009), 46 (03/23/2009)  Problem # 4:  IDDM  (ICD-250.01) Assessment: Comment Only  Her updated medication list for this problem includes:    Glyburide-metformin 5-500 Mg Tabs (Glyburide-metformin) .Marland Kitchen... Take two tablets by mouth twice a ay    Benazepril-hydrochlorothiazide 20-12.5 Mg Tabs (Benazepril-hydrochlorothiazide) .Marland Kitchen... Take two tablets by mouth in the mornings    Novolog Mix 70/30 Flexpen 70-30 % Susp (Insulin aspart prot & aspart) .Marland Kitchen... 27 units in the morning and 20 units in the evening Patient advised to reduce carbs and sweets, commit to regular physical activity, take meds as prescribed, test blood sugars as directed, and attempt to lose weight , to improve blood sugar control.  Labs Reviewed: Creat: 0.77 (12/21/2009)   Microalbumin: 0.20 (02/17/2007) Reviewed HgBA1c results: 6.9 (12/21/2009)  6.2 (08/24/2009)  Complete Medication List: 1)  Glyburide-metformin 5-500 Mg Tabs (Glyburide-metformin) .... Take two tablets by mouth twice a ay 2)  Benazepril-hydrochlorothiazide 20-12.5 Mg  Tabs (Benazepril-hydrochlorothiazide) .... Take two tablets by mouth in the mornings 3)  Bd Ultra-fine Pen Needles 29g X 12.38mm Misc (Insulin pen needle) .... Use as directed 4)  T[employer] Smart System Kit (Blood glucose monitoring suppl) .... Two times a day testing 5)  T[employer]etrack Test Strp (Glucose blood) .... Two times a day testing 6)  Truetrack Blood Glucose Devi (Blood glucose monitoring suppl) .... Two times a day testing 7)  Bd Insulin Syr Ultrafine Ii 31g X 5/16" 1 Ml Misc (Insulin syringe-needle u-100) .... Two times a day use 8)  Novolog Mix 70/30 Flexpen 70-30 % Susp (Insulin aspart prot & aspart) .... 27 units in the morning and 20 units in the evening 9)  True Track Lancets  .... For two times a day testing 10)  Calcium Plus Vitamin D3  .... 2 tab daily 11)  Simvastatin 40 Mg Tabs (Simvastatin) .... Take 1 tab by mouth at bedtime 12)  Ibuprofen 200 Mg Tabs (Ibuprofen) .... As needed 13)  Gabapentin 300 Mg Caps (Gabapentin) ....  Two capsules at bedtime  Patient Instructions: 1)  F/U as before. 2)  We will get an appt for DrBethea to reasses you, you may need to consider back surgery. 3)  Pls inc the gabapentin to two at night. 4)  Injections in the office today and prednisone dose pack will be sent in. Prescriptions: PREDNISONE (PAK) 5 MG TABS (PREDNISONE) Use as directed  #21 x 0   Entered and Authorized by:   Syliva Overman MD   Signed by:   Syliva Overman MD on 03/18/2010   Method used:   Electronically to        [facility]* (retail)       [address]       [address] Shipping/mailing       [place], [place]  [address]       Ph: [phone]       Fax: [phone]   RxID:   [id number]    Medication Administration  Injection # 1:    Medication: Depo- Medrol 80mg     Diagnosis: BACK PAIN WITH RADICULOPATHY (ICD-729.2)    Route: IM    Site: RUOQ gluteus    Exp Date: 08/2010    Lot #: Gunnar Bulla     Mfr: Pharmacia    Comments: 80mg  given     Patient tolerated injection without complications    Given by: Everitt Amber LPN (March 18, 2010 5:16 PM)  Injection # 2:    Medication: Ketorolac-Toradol 15mg     Diagnosis: BACK PAIN WITH RADICULOPATHY (ICD-729.2)    Route: IM    Site: LUOQ gluteus    Exp Date: 08/2011    Lot #: [id number]     Mfr: [email or web address]    Comments: 60mg  given     Patient tolerated injection without complications    Given by: Everitt Amber LPN (March 18, 2010 5:16 PM)  Orders Added: 1)  Est. Patient Level IV [60737] 2)  Depo- Medrol 80mg  [J1040] 3)  Ketorolac-Toradol 15mg  [J1885] 4)  Admin of Therapeutic Inj  intramuscular or subcutaneous [96372] 5)  Pain Clinic Referral [Pain]     Medication Administration  Injection # 1:    Medication: Depo- Medrol 80mg     Diagnosis: BACK PAIN WITH RADICULOPATHY (ICD-729.2)    Route: IM    Site: RUOQ gluteus    Exp Date: 08/2010    Lot #: Gunnar Bulla     Mfr: Pharmacia    Comments: 80mg   given     Patient  tolerated injection without complications    Given by: Everitt Amber LPN (March 18, 2010 5:16 PM)  Injection # 2:    Medication: Ketorolac-Toradol 15mg     Diagnosis: BACK PAIN WITH RADICULOPATHY (ICD-729.2)    Route: IM    Site: LUOQ gluteus    Exp Date: 08/2011    Lot #: [id number]     Mfr: [email or web address]    Comments: 60mg  given     Patient tolerated injection without complications    Given by: Everitt Amber LPN (March 18, 2010 5:16 PM)  Orders Added: 1)  Est. Patient Level IV [16109] 2)  Depo- Medrol 80mg  [J1040] 3)  Ketorolac-Toradol 15mg  [J1885] 4)  Admin of Therapeutic Inj  intramuscular or subcutaneous [96372] 5)  Pain Clinic Referral [Pain]

## 2010-04-03 NOTE — Progress Notes (Signed)
Summary: needles  Phone Note Call from Patient   Summary of Call: pt needs bd ultra fine pen needles ordered from [facility]. [phone] Initial call taken by: Rudene Anda,  March 24, 2010 9:01 AM    Prescriptions: BD INSULIN SYR ULTRAFINE II 31G X 5/16" 1 ML MISC (INSULIN SYRINGE-NEEDLE U-100) two times a day use  #180 x 3   Entered by:   Everitt Amber LPN   Authorized by:   Syliva Overman MD   Signed by:   Everitt Amber LPN on 45/40/9811   Method used:   Electronically to        [facility]* (retail)       [address]       [address] Shipping/mailing       [place], [place]  [address]       Ph: [phone]       Fax: [phone]   RxID:   [id number] BD ULTRA-FINE PEN NEEDLES 29G X 12.7MM MISC (INSULIN PEN NEEDLE) Use as directed  #180 x 3   Entered by:   Everitt Amber LPN   Authorized by:   Syliva Overman MD   Signed by:   Everitt Amber LPN on 66/44/0347   Method used:   Electronically to        [facility]* (retail)       [address]       [address] Shipping/mailing       [place], [place]  [address]       Ph: [phone]       Fax: [phone]   RxID:   [id number]

## 2010-04-23 LAB — CONVERTED CEMR LAB
Bilirubin, Direct: 0.1 mg/dL (ref 0.0–0.3)
CO2: 23 meq/L (ref 19–32)
Glucose, Bld: 127 mg/dL — ABNORMAL HIGH (ref 70–99)
Hgb A1c MFr Bld: 7 % — ABNORMAL HIGH (ref <5.7)
Potassium: 4.7 meq/L (ref 3.5–5.3)
Sodium: 137 meq/L (ref 135–145)
Total Bilirubin: 0.3 mg/dL (ref 0.3–1.2)
Total CHOL/HDL Ratio: 2.9
VLDL: 8 mg/dL (ref 0–40)

## 2010-05-01 ENCOUNTER — Other Ambulatory Visit: Payer: Self-pay | Admitting: Family Medicine

## 2010-05-01 ENCOUNTER — Ambulatory Visit (INDEPENDENT_AMBULATORY_CARE_PROVIDER_SITE_OTHER): Payer: Commercial Managed Care - PPO | Admitting: Family Medicine

## 2010-05-01 ENCOUNTER — Encounter: Payer: Self-pay | Admitting: Family Medicine

## 2010-05-01 DIAGNOSIS — Z139 Encounter for screening, unspecified: Secondary | ICD-10-CM

## 2010-05-01 DIAGNOSIS — M1710 Unilateral primary osteoarthritis, unspecified knee: Secondary | ICD-10-CM

## 2010-05-01 DIAGNOSIS — E109 Type 1 diabetes mellitus without complications: Secondary | ICD-10-CM

## 2010-05-01 DIAGNOSIS — IMO0002 Reserved for concepts with insufficient information to code with codable children: Secondary | ICD-10-CM

## 2010-05-01 DIAGNOSIS — M25569 Pain in unspecified knee: Secondary | ICD-10-CM

## 2010-05-01 DIAGNOSIS — E785 Hyperlipidemia, unspecified: Secondary | ICD-10-CM

## 2010-05-05 ENCOUNTER — Encounter: Payer: Self-pay | Admitting: Family Medicine

## 2010-05-13 NOTE — Letter (Signed)
Summary: D/C MEDICINE  D/C MEDICINE   Imported By: Lind Guest 05/05/2010 14:42:55  _____________________________________________________________________  External Attachment:    Type:   Image     Comment:   External Document

## 2010-05-13 NOTE — Assessment & Plan Note (Signed)
Summary: follow up 4 mth/slj   Vital Signs:  Patient profile:   65 year old female Menstrual status:  hysterectomy Height:      68 inches Weight:      214.50 pounds BMI:     32.73 O2 Sat:      98 % Pulse rate:   85 / minute Pulse rhythm:   regular Resp:     16 per minute BP sitting:   130 / 82  (left arm) Cuff size:   large  Vitals Entered By: Everitt Amber LPN (May 01, 2438 8:10 AM)  Nutrition Counseling: Patient's BMI is greater than 25 and therefore counseled on weight management options. CC: Follow up chronic problems, stiff knees this morning   Primary Care Provider:  Syliva Overman MD  CC:  Follow up chronic problems and stiff knees this morning.  History of Present Illness: Reports  that she is doing fairly well. Denies recent fever or chills. Denies sinus pressure, nasal congestion , ear pain or sore throat. Denies chest congestion, or cough productive of sputum. Denies chest pain, palpitations, PND, orthopnea or leg swelling. Denies abdominal pain, nausea, vomitting, diarrhea or constipation. Denies change in bowel movements or bloody stool. Denies dysuria , frequency, incontinence or hesitancImproved back pain with last epidural, however hqaving knee pain and stiffness today.Denies  joint pain, swelling, or reduced mobility. Denies headaches, vertigo, seizures. Denies depression, anxiety or insomnia. Denies  rash, lesions, or itch.     Current Medications (verified): 1)  Glyburide-Metformin 5-500 Mg  Tabs (Glyburide-Metformin) .... Take Two Tablets By Mouth Twice A Ay 2)  Benazepril-Hydrochlorothiazide 20-12.5 Mg  Tabs (Benazepril-Hydrochlorothiazide) .... Take Two Tablets By Mouth in The Mornings 3)  Bd Ultra-Fine Pen Needles 29g X 12.39mm Misc (Insulin Pen Needle) .... Use As Directed 4)  Truetrack Smart System  Kit (Blood Glucose Monitoring Suppl) .... Two Times A Day Testing 5)  Truetrack Test  Strp (Glucose Blood) .... Two Times A Day Testing 6)   Truetrack Blood Glucose  Devi (Blood Glucose Monitoring Suppl) .... Two Times A Day Testing 7)  Bd Insulin Syr Ultrafine Ii 31g X 5/16" 1 Ml Misc (Insulin Syringe-Needle U-100) .... Two Times A Day Use 8)  Novolog Mix 70/30 Flexpen 70-30 % Susp (Insulin Aspart Prot & Aspart) .... 27 Units in The Morning and 20 Units in The Evening 9)  True Track Lancets .... For Two Times A Day Testing 10)  Calcium Plus Vitamin D3 .... 2 Tab Daily 11)  Simvastatin 40 Mg Tabs (Simvastatin) .... Take 1 Tab By Mouth At Bedtime 12)  Gabapentin 300 Mg Caps (Gabapentin) .... Two Capsules At Bedtime  Allergies (verified): 1)  ! Simvastatin (Simvastatin)  Review of Systems      See HPI General:  Complains of fatigue. Eyes:  Complains of vision loss-both eyes; denies discharge, eye pain, and red eye. MS:  Complains of joint pain and stiffness; bilateral knee pain and stiffness x 1 week, also muscle cramps with simvastatin. Endo:  Denies cold intolerance, excessive hunger, excessive thirst, excessive urination, and heat intolerance; fasting sugars are seldom over 130. Heme:  Denies abnormal bruising, bleeding, enlarge lymph nodes, and fevers. Allergy:  Denies hives or rash and itching eyes.  Physical Exam  General:  Well-developed,well-nourished,in no acute distress; alert,appropriate and cooperative throughout examination. HEENT: No facial asymmetry,  EOMI, No sinus tenderness, TM's Clear, oropharynx  pink and moist.   Chest: Clear to auscultation bilaterally.  CVS: S1, S2, No murmurs, No S3.  Abd: Soft, Nontender.  MS: decreased  ROM spine,and knees. Adequate in shoulders and hips. Ext: No edema.   CNS: CN 2-12 intact, power tone and sensation normal throughout.   Skin: Intact, no visible lesions or rashes.  Psych: Good eye contact, normal affect.  Memory intact, not anxious or depressed appearing.   Diabetes Management Exam:    Foot Exam (with socks and/or shoes not present):        Sensory-Monofilament:          Left foot: diminished          Right foot: diminished       Inspection:          Left foot: normal          Right foot: normal       Nails:          Left foot: thickened          Right foot: thickened   Impression & Recommendations:  Problem # 1:  BACK PAIN WITH RADICULOPATHY (ICD-729.2) Assessment Improved  Problem # 2:  IDDM (ICD-250.01) Assessment: Unchanged  Her updated medication list for this problem includes:    Glyburide-metformin 5-500 Mg Tabs (Glyburide-metformin) .Marland Kitchen... Take two tablets by mouth twice a ay    Benazepril-hydrochlorothiazide 20-12.5 Mg Tabs (Benazepril-hydrochlorothiazide) .Marland Kitchen... Take two tablets by mouth in the mornings    Novolog Mix 70/30 Flexpen 70-30 % Susp (Insulin aspart prot & aspart) .Marland Kitchen... 27 units in the morning and 20 units in the evening  Labs Reviewed: Creat: 0.83 (04/19/2010)   Microalbumin: 0.20 (02/17/2007) Reviewed HgBA1c results: 7.0 (04/19/2010)  6.9 (12/21/2009)  Problem # 3:  ESSENTIAL HYPERTENSION, BENIGN (ICD-401.1) Assessment: Unchanged  BP today: 130/82 Prior BP: 120/80 (03/18/2010)  Labs Reviewed: K+: 4.7 (04/19/2010) Creat: : 0.83 (04/19/2010)   Chol: 191 (04/19/2010)   HDL: 65 (04/19/2010)   LDL: 118 (04/19/2010)   TG: 40 (04/19/2010)  Problem # 4:  HYPERLIPIDEMIA (ICD-272.4) Assessment: Deteriorated  The following medications were removed from the medication list:    Simvastatin 40 Mg Tabs (Simvastatin) .Marland Kitchen... Take 1 tab by mouth at bedtime Her updated medication list for this problem includes:    Pravastatin Sodium 40 Mg Tabs (Pravastatin sodium) .Marland Kitchen... Take 1 tab by mouth at bedtime , discontinue simvastatin effective 05/01/2010  Orders: T-Hepatic Function [phone]) T-Lipid Profile [phone])  Labs Reviewed: SGOT: 18 (04/19/2010)   SGPT: 15 (04/19/2010)   HDL:65 (04/19/2010), 55 (08/24/2009)  LDL:118 (04/19/2010), 97 (95/18/8416)  Chol:191 (04/19/2010), 160 (08/24/2009)   Trig:40 (04/19/2010), 42 (08/24/2009)  Problem # 5:  KNEE PAIN (ICD-719.46) Assessment: Deteriorated  The following medications were removed from the medication list:    Ibuprofen 200 Mg Tabs (Ibuprofen) .Marland Kitchen... As needed Her updated medication list for this problem includes:    Vimovo 500-20 Mg Tbec (Naproxen-esomeprazole) .Marland Kitchen... Take 1 tablet by mouth two times a day  Orders: Ketorolac-Toradol 15mg  (S0630) Admin of Therapeutic Inj  intramuscular or subcutaneous (16010)  Complete Medication List: 1)  Glyburide-metformin 5-500 Mg Tabs (Glyburide-metformin) .... Take two tablets by mouth twice a ay 2)  Benazepril-hydrochlorothiazide 20-12.5 Mg Tabs (Benazepril-hydrochlorothiazide) .... Take two tablets by mouth in the mornings 3)  Bd Ultra-fine Pen Needles 29g X 12.22mm Misc (Insulin pen needle) .... Use as directed 4)  Truetrack Smart System Kit (Blood glucose monitoring suppl) .... Two times a day testing 5)  Truetrack Test Strp (Glucose blood) .... Two times a day testing 6)  Truetrack Blood Glucose Devi (Blood glucose monitoring suppl) .... Two  times a day testing 7)  Bd Insulin Syr Ultrafine Ii 31g X 5/16" 1 Ml Misc (Insulin syringe-needle u-100) .... Two times a day use 8)  Novolog Mix 70/30 Flexpen 70-30 % Susp (Insulin aspart prot & aspart) .... 27 units in the morning and 20 units in the evening 9)  True Track Lancets  .... For two times a day testing 10)  Calcium Plus Vitamin D3  .... 2 tab daily 11)  Gabapentin 300 Mg Caps (Gabapentin) .... Two capsules at bedtime 12)  Pravastatin Sodium 40 Mg Tabs (Pravastatin sodium) .... Take 1 tab by mouth at bedtime , discontinue simvastatin effective 05/01/2010 13)  Vimovo 500-20 Mg Tbec (Naproxen-esomeprazole) .... Take 1 tablet by mouth two times a day  Other Orders: T-Basic Metabolic Panel [id number]) T-CBC w/Diff [id number]) T- Hemoglobin A1C ([id number]) T-TSH (44034-74259) Future Orders: Radiology Referral (Radiology) ...  05/02/2010  Patient Instructions: 1)  Please schedule a follow-up appointment in 4 months. 2)  You need to lose weight. Consider a lower calorie diet and regular exercise.  3)  BMP prior to visit, ICD-9: 4)  Hepatic Panel prior to visit, ICD-9: 5)  Lipid Panel prior to visit, ICD-9: 6)  TSH prior to visit, ICD-9: 7)  CBC w/ Diff prior to visit, ICD-9: 8)  HbgA1C prior to visit, ICD-9: 9)  pLS stop siimvastatin since it causes muscle aches, and start pravastatin 10)  New med samples of vimovo, one twice daily for the next 6 days for knee pain, start today 11)  You will get toradol 60mg  IM in the office for knee pain  Prescriptions: BENAZEPRIL-HYDROCHLOROTHIAZIDE 20-12.5 MG  TABS (BENAZEPRIL-HYDROCHLOROTHIAZIDE) Take two tablets by mouth in the mornings  #180 x 1   Entered by:   Adella Hare LPN   Authorized by:   Syliva Overman MD   Signed by:   Adella Hare LPN on 56/38/7564   Method used:   Electronically to        [facility]* (retail)       [address]       [address] Shipping/mailing       [place], [place]  [address]       Ph: [phone]       Fax: [phone]   RxID:   [id number] GLYBURIDE-METFORMIN 5-500 MG  TABS (GLYBURIDE-METFORMIN) Take two tablets by mouth twice a ay  #360 x 1   Entered by:   Adella Hare LPN   Authorized by:   Syliva Overman MD   Signed by:   Adella Hare LPN on 76/28/3151   Method used:   Electronically to        [facility]* (retail)       [address]       [address] Shipping/mailing       [place], [place]  [address]       Ph: [phone]       Fax: [phone]   RxID:   [id number] VIMOVO 500-20 MG TBEC (NAPROXEN-ESOMEPRAZOLE) Take 1 tablet by mouth two times a day  #12 x 0   Entered and Authorized by:   Syliva Overman MD   Signed by:   Syliva Overman MD on 05/01/2010   Method used:   Samples Given   RxID:   [id number] PRAVASTATIN SODIUM 40 MG TABS (PRAVASTATIN SODIUM)  Take 1 tab by mouth at bedtime , discontinue simvastatin effective 05/01/2010  #90 x 1   Entered and Authorized by:  Syliva Overman MD   Signed by:   Syliva Overman MD on 05/01/2010   Method used:   Printed then faxed to ...       [facility]* (retail)       [address]       [address] Shipping/mail[address]       [place], [place]  [address]       Ph: [phone]       Fax: [phone]   RxID:   [id number]    Medication Administration  Injection # 1:    Medication: Ketorolac-Toradol 15mg     Diagnosis: KNEE PAIN (ICD-719.46)    Route: IM    Site: RUOQ gluteus    Exp Date: 08/01/2011    Lot #: [id number]    Mfr: [email or web address]    Comments: toradol 60mg  given    Patient tolerated injection without complications    Given by: Adella Hare LPN (April 30, 100 8:41 AM)  Orders Added: 1)  Est. Patient Level IV [72536] 2)  T-Basic Metabolic Panel [64403-47425] 3)  T-Hepatic Function [80076-22960] 4)  T-Lipid Profile [80061-22930] 5)  T-CBC w/Diff [95638-75643] 6)  T- Hemoglobin A1C [83036-23375] 7)  T-TSH [32951-88416] 8)  Ketorolac-Toradol 15mg  [J1885] 9)  Admin of Therapeutic Inj  intramuscular or subcutaneous [96372] 10)  Radiology Referral [Radiology]   vimovo samples [id number] 12/02/10  Medication Administration  Injection # 1:    Medication: Ketorolac-Toradol 15mg     Diagnosis: KNEE PAIN (ICD-719.46)    Route: IM    Site: RUOQ gluteus    Exp Date: 08/01/2011    Lot #: [id number]    Mfr: [email or web address]    Comments: toradol 60mg  given    Patient tolerated injection without complications    Given by: Adella Hare LPN (May 01, 107 8:41 AM)  Orders Added: 1)  Est. Patient Level IV [32355] 2)  T-Basic Metabolic Panel [73220-25427] 3)  T-Hepatic Function [80076-22960] 4)  T-Lipid Profile [80061-22930] 5)  T-CBC w/Diff [06237-62831] 6)  T- Hemoglobin A1C [83036-23375] 7)  T-TSH [51761-60737] 8)  Ketorolac-Toradol 15mg  [J1885] 9)  Admin of Therapeutic  Inj  intramuscular or subcutaneous [96372] 10)  Radiology Referral [Radiology]

## 2010-05-14 ENCOUNTER — Ambulatory Visit: Payer: Commercial Managed Care - PPO | Admitting: Orthopedic Surgery

## 2010-06-04 ENCOUNTER — Other Ambulatory Visit: Payer: Self-pay | Admitting: Family Medicine

## 2010-06-05 ENCOUNTER — Other Ambulatory Visit: Payer: Self-pay

## 2010-06-05 LAB — BASIC METABOLIC PANEL
Chloride: 103 mEq/L (ref 96–112)
Creatinine, Ser: 0.67 mg/dL (ref 0.4–1.2)
GFR calc Af Amer: >60 mL/min (ref >60)
Potassium: 4.1 mEq/L (ref 3.5–5.1)

## 2010-06-05 LAB — CBC
HCT: 34 % — ABNORMAL LOW (ref 36.0–46.0)
MCV: 94.6 fL (ref 78.0–100.0)
RBC: 3.59 MIL/uL — ABNORMAL LOW (ref 3.87–5.11)
WBC: 8.4 10*3/uL (ref 4.0–10.5)

## 2010-06-05 LAB — GLUCOSE, CAPILLARY: Glucose-Capillary: 135 mg/dL — ABNORMAL HIGH (ref 70–99)

## 2010-06-05 MED ORDER — BENAZEPRIL-HYDROCHLOROTHIAZIDE 20-12.5 MG PO TABS: ORAL_TABLET | ORAL | Status: DC

## 2010-06-13 ENCOUNTER — Ambulatory Visit (HOSPITAL_COMMUNITY)
Admission: RE | Admit: 2010-06-13 | Discharge: 2010-06-13 | Disposition: A | Discharge status: Home / Self Care | Payer: 59 | Source: Ambulatory Visit | Attending: Family Medicine | Admitting: Family Medicine

## 2010-06-13 DIAGNOSIS — Z139 Encounter for screening, unspecified: Secondary | ICD-10-CM

## 2010-06-16 ENCOUNTER — Ambulatory Visit (HOSPITAL_COMMUNITY)
Admission: RE | Admit: 2010-06-16 | Discharge: 2010-06-16 | Disposition: A | Discharge status: Home / Self Care | Payer: 59 | Source: Ambulatory Visit | Attending: Family Medicine | Admitting: Family Medicine

## 2010-06-16 ENCOUNTER — Ambulatory Visit (HOSPITAL_COMMUNITY): Admission: RE | Admit: 2010-06-16 | Payer: 59 | Source: Ambulatory Visit

## 2010-06-16 ENCOUNTER — Other Ambulatory Visit: Payer: Self-pay | Admitting: Family Medicine

## 2010-06-16 DIAGNOSIS — Z1231 Encounter for screening mammogram for malignant neoplasm of breast: Secondary | ICD-10-CM | POA: Insufficient documentation

## 2010-06-16 DIAGNOSIS — Z139 Encounter for screening, unspecified: Secondary | ICD-10-CM

## 2010-06-24 ENCOUNTER — Ambulatory Visit (INDEPENDENT_AMBULATORY_CARE_PROVIDER_SITE_OTHER): Payer: 59 | Admitting: Orthopedic Surgery

## 2010-06-24 ENCOUNTER — Encounter: Payer: Self-pay | Admitting: Orthopedic Surgery

## 2010-06-24 DIAGNOSIS — M1710 Unilateral primary osteoarthritis, unspecified knee: Secondary | ICD-10-CM

## 2010-06-24 NOTE — Discharge Summary (Signed)
Separate x-ray report [facility] lateral and patellofemoral x-rays of the RIGHT knee  Reason for x-ray and followup osteoarthritis  X-rays show near bone-on-bone changes lateral compartment with lateral compartment spurs, there is peaking of the tibial spines.  Valgus angle approximately 12.  Impression valgus osteoarthritis RIGHT knee

## 2010-06-24 NOTE — Progress Notes (Signed)
   Stacey Hanson is here for her 6 month followup for x-ray shows valgus osteoarthritis of the RIGHT knee.  She says her symptoms are minor as long as she is not up on her feet.  It seems that work seems to bother her the most when she is on a concrete floors.  She is not really taking any pain medicine for her knee including any over-the-counter medication.  She has no catching locking giving way or instability  Clinical exam is unchanged  Radiographs are taken today. She has abou a 12 valgus angle And she has severe valgus osteoarthritis of the knee.  Recommend 6 month followup x-ray

## 2010-07-15 NOTE — Op Note (Signed)
NAME:  Stacey Hanson, Stacey Hanson NO.:  [id number]   MEDICAL RECORD NO.:  [id number]          PATIENT TYPE:  AMB   LOCATION:  DAY                           FACILITY:  [facility]   PHYSICIAN:  Kassie Mends, M.D.      DATE OF BIRTH:  10-20-1945   DATE OF PROCEDURE:  DATE OF DISCHARGE:                                PROCEDURE NOTE   REFERRING PHYSICIAN:  Syliva Overman, M.D.   PROCEDURE:  Colonoscopy.   INDICATIONS FOR EXAM:  Stacey Hanson is a 65 year old female who presents  for colon cancer screening.   FINDINGS:  Occasional diverticula seen in the proximal transverse colon.  Small internal hemorrhoids.  Otherwise, no polyps, masses, inflammatory  changes, or AVMs seen.   RECOMMENDATIONS:  1. Screening colonoscopy in 10 years,  2. She should follow high-fiber diet.  She was given a handout on high-      fiber diet, diverticulosis, and hemorrhoids.   MEDICATIONS:  1. Demerol 75 mg IV.  2. Versed 8 mg IV.   PROCEDURE TECHNIQUE:  Physical exam was performed.  Informed consent was  obtained from the patient after explaining the benefits, risks, and  alternatives to the procedure.  The patient was connected to the monitor  and placed in the left lateral position.  Continuous oxygen was provided  by nasal cannula, and IV medicine was administered through an indwelling  cannula.  After administration of sedation and rectal exam, the  patient's rectum was intubated and the scope was advanced under direct  visualization to the cecum.  The scope was removed slowly by carefully  examining the color, texture, anatomy, and integrity of the mucosa on  the way out.  The patient was recovered in endoscopy and discharged home  in a satisfactory condition.      Kassie Mends, M.D.  Electronically Signed    SM/MEDQ  D:  07/11/2007  T:  07/11/2007  Job:  [id number]   cc:   Milus Mallick. Lodema Hong, M.D.  Fax: [phone]

## 2010-07-18 NOTE — Consult Note (Signed)
NAME:  Stacey Hanson, BRACHER NO.:  [id number]   MEDICAL RECORD NO.:  [id number]                   PATIENT TYPE:  INP   LOCATION:  A322                                 FACILITY:  [facility]   PHYSICIAN:  Lionel December, M.D.                 DATE OF BIRTH:  January 08, 1946   DATE OF CONSULTATION:  12/18/2002  DATE OF DISCHARGE:                                   CONSULTATION   REASON FOR CONSULTATION:  Nausea, vomiting and upper abdominal pain in a  patient with diabetes mellitus.   HISTORY OF PRESENT ILLNESS:  Stacey Hanson is a 65 year old African-American female  who was admitted to Dr. Althea Grimmer service two days ago via the emergency  room where she presented with a two-week history of nausea, vomiting and  intermittent pain across her upper abdomen.  She states that she has had  these symptoms off-and-on for the last few months, but they have been more-  or-less constant or on a daily basis over the last two months. She was seen  by Dr. Syliva Overman.  Four days ago she had upper abdominal ultrasound.  This suggested a fatty liver and bile duct was 8.3 mm which was unchanged  from previous study of October 2002.  The patient was also febrile and had  leukocytosis.  Her urinalysis revealed 21-50 wbc's and 7-10 rbc's and many  bacteria.  She also had blood cultures which have remained negative. She was  begun on IV fluids and Levaquin. She had an acute abdominal series which was  unremarkable.  She had chest film which suggested midlung nodule.  This was  further evaluated with chest CT which does not show any nodular adenopathy.   The patient continues to have nausea and vomiting.  Yesterday she was begun  on Reglan, but so far she does not feel any better.  She denies headache,  dysuria, hematuria, vaginal discharge, melena or rectal bleeding.  She also  denies diarrhea.  She states that she has lost 12 pounds in the last 1 month  and she does not have a good  appetite.   MEDICATIONS:  At home she is on:  1. Glyburide.  2. Diovan.  3. Insulin 70/30.  4. Crestor.  Presently she is on:  1. She is on Reglan 10 mg IV a.c. and q.h.s.  2. Glyburide 10 mg daily.  3. Avapro 300 mg daily.  4. Levaquin 500 mg IV q.24h.  5. Phenergan 12.5 mg IV q.4h. p.r.n.  6. Insulin via sliding scale coverage.   PAST MEDICAL HISTORY:  She has been diabetic since 1987 and she has been on  insulin for the past 4 years.  She has been hypertensive for several years.  She has hyperlipidemia.  She had hysterectomy in 1970.  She is not sure  whether or not her ovaries were removed.   I saw Taiya in November 2002 because  of a history of nausea and vomiting,  abdominal pain and mildly dilated bile duct.  At that time her CBD was 8.3  as noted above.  By the time that I saw her she was asymptomatic on PPI.  She was seen back in January 2003 and was asymptomatic.  She had a follow up  ultrasound in March 2003 and her CBD was 8 mm.  I, therefore, felt that she  did not need further studies.  She states she did fine until recently.   ALLERGIES:  No known drug allergies.   FAMILY HISTORY:  Both parents are deceased.  She has 1 brother and 2 sisters  living.  One brother died of CHF.  He was diabetic. Another brother died of  an auto accident.   SOCIAL HISTORY:  She is single. She has 3 children.  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  She does not smoke  cigarettes or drink alcohol.   PHYSICAL EXAMINATION:  GENERAL:  A pleasant, well-developed, well-nourished,  African-American female who is in no acute distress, however, she heaved a  couple of times and vomited some bile. She has not had any hematemesis.  VITAL SIGNS:  Admission weight 198.4 pounds. She is 5 feet 9-1/2 inches  tall.  Pulse 83 per minute, blood pressure 150/86, temperature is 100.5  and  respirations 24.  HEENT:  Conjunctivae are pink.  Sclerae are nonicteric.  Oropharyngeal  mucosa is normal.  NECK:  Neck is supple.  JVD is normal.  No thyromegaly or lymphadenopathy.  CARDIOVASCULAR:  Cardiac exam is regular rhythm. Normal S1 and S2.  No  murmur or gallop is noted.  LUNGS:  Clear to auscultation.  ABDOMEN:  Her abdomen is symmetrical.  Bowel sounds are normal.  No bruits  noted.  On palpation she has a soft abdomen without tenderness, organomegaly  or masses.  EXTREMITIES:  She does not have clubbing or peripheral edema.   LABS:  On admission WBC was 14.3, H&H 13 and 38.4.  Platelet count 480,000.  She had 80 segs.  Sodium was 126, potassium 3.5, chloride 87, CO2 28,  glucose 263, BUN 22, creatinine 0.9.  Bilirubin was 0.7. AP 64, AST 20, ALT  25, total protein 8.1 with albumin 4.0, calcium was 8.8.  Her serum sodium  today was 128 and potassium was 3.1.   Labs as above.  Recent CT suggested a 1.5 mm calcification in the common  bile duct, however, no abnormalities seen on ultrasound done 4 days ago.  Serum amylase on admission was 81 and lipase was 25.  Her hemoglobin A1c was  8.6.   ASSESSMENT:  Stacey Hanson is a 65 year old African-American female with a 17-year  history of diabetes mellitus who is now insulin-dependent.  She presents  with a few weeks history of nausea and vomiting and upper abdominal pain.  She has mild leukocytosis and low-grade fever.  Her H&H, amylase, lipase and  LFTs are normal.  She does have hyponatremia and hypokalemia which is being  evaluated by Dr. Josefine Class.  She has a very small calcification in the common  bile duct which is very curious as it was not seen on ultrasound done a few  days ago.  Even if it is a small calculus I would expect that she should  pass this without any difficulty.  At any rate, I am not convinced that her symptoms are biliary in origin. I agree with Dr. Josefine Class and Dr. 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  She may require high dose of Reglan if present dose is  ineffective. We also need to rule out peptic ulcer disease as well as  biliary dyskinesis.   She is already scheduled for hepatobiliary scan on an outpatient basis to be  performed at 7:30 in the a.m. and I feel that we should proceed with it.   Her electrolyte abnormalities may be secondary to nausea and vomiting.   RECOMMENDATIONS:  Will proceed with hepatobiliary scan with [facility] in the a.m.  which has already been scheduled.  If this study is normal, will proceed  with diagnostic esophagogastroduodenoscopy and in the meantime continue  therapy with a PPI and a promotility agent.   I would like to thank Dr. Josefine Class for the opportunity to participate in the  care of this nice lady.      ___________________________________________                                            Lionel December, M.D.   NR/MEDQ  D:  12/18/2002  T:  12/19/2002  Job:  [id number]

## 2010-07-18 NOTE — H&P (Signed)
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  She  reports a one week history of recurrent nausea and vomiting and some  generalized abdominal pain, which is relieved by vomiting.  She denies  specifically any melena, hematemesis or hematochezia.  She does admit to  some constipation.  She was seen in the ER and had multiple electrolyte  abnormalities, hyponatremia and hypochloremia and also, had some orthostatic  blood pressure changes with a history of hypertension and she is admitted  for suspected gastroenteritis and/or diabetic gastroparesis.  She  specifically denies dizziness or syncopal episodes, chest pain or  palpitations.   PAST MEDICAL HISTORY:  Significant for insulin-dependent diabetes,  hypertension and hyperlipidemia.   PAST SURGICAL HISTORY:  Remarkable for TAH/BSO.   SOCIAL HISTORY:  She smoked for ten years, but quit a long time ago.  She is  single and has three children.  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   ALLERGIES:  No known allergies.   CURRENT MEDICATIONS:  1. Insulin 70/30, 5 units b.i.d.  2. Glyburide 10 mg 2 tablets b.i.d.  3. Diovan 160 mg 1 tablet b.i.d.   PHYSICAL EXAMINATION:  VITAL SIGNS:  Blood pressure 134/85, dropping to  113/70, orthostatically positive temperature is 100.1.  HEENT:  Normocephalic, atraumatic.  Eyes PERRLA.  Sclerae clear.  Conjunctivae pink.  Throat, mild erythema, no exudates.  NECK:  No JVD, no carotid bruits, no  thyromegaly, no thyroid bruits.  HEART:  Regular rhythm, no S3, S4 positive, no murmurs, no heaves, thrills  or rubs.  ABDOMEN:  Soft, nontender, bowel sounds normoactive, no guarding, rebound,  no masses, no organomegaly, no borborygmi noted.  EXTREMITIES:  No clubbing, cyanosis or edema.  NEUROLOGIC:  Cranial nerves grossly intact.  Moves all four extremities.   IMPRESSIONS:  1. Recurrent nausea and vomiting.  2. Intravascular volume depletion.  3. Rule out diabetic gastroparesis.  4. Gastroenteritis.  5. Insulin-dependent diabetes.  6. Hypertension, now with orthostatic hypotension.  7. Hyperlipidemia.  8. Low sodium and low chlorides.   PLAN:  Plan is to give IV fluids, 0.9 normal saline, Reglan IV 10 mg q.6h.,  clear liquid diet, monitor her electrolytes, potassium and so forth.  Hemoglobin A1C, sliding scale and I will make further recommendations as the  database expands.      ___________________________________________  Melvyn Novas, MD   RMD/MEDQ  D:  12/16/2002  T:  12/17/2002  Job:  [id number]

## 2010-07-18 NOTE — Consult Note (Signed)
[facility]  Patient:    Stacey Hanson, Stacey Hanson Visit Number: [id number] MRN: [id number]          Service Type: OUT Location: RAD Attending Physician:  Stacey Hanson Dictated by:   Stacey Hanson, M.D. Proc. Date: 01/06/01 Admit Date:  12/10/2000 Discharge Date: 12/10/2000   CC:         Stacey Hanson, M.D.   Consultation Report  DATE OF BIRTH:  November 07, 1945  REASON FOR CONSULTATION: 1. Upper abdominal pain. 2. Dilated bile duct.  HISTORY OF PRESENT ILLNESS:  The patient is a 65 year old African-American female who was referred through courtesy of Dr. Loleta Hanson for GI evaluation.  She presents with history of episodic nausea, vomiting and upper abdominal pain which she has had for a year or longer.  Each time she has gotten better after a few days.  The last episode was about a month ago and she was sick for about a week.  She saw Dr. Loleta Hanson.  She had upper abdominal ultrasound on December 10, 2000, which revealed mildly dilated bile duct measuring 8.3 mm but there was no evidence of cholelithiasis or gallbladder wall thickening.  Liver was noted to be mildly echogenic.  No stones or sludge was identified in the bile duct. Stacey Hanson presently has no symptoms.  When she saw Dr. Loleta Hanson, she was given a prescription for Reglan but she has not yet filled it.  She previously had tried Axid but could not really tell any difference.  She has gone several months between these episodes.  Her pain is described to be sharp across her upper abdomen.  She denies any back or scapular pain.  She has heartburn at a frequency of two or three per week generally associated with fatty or fried foods.  She has a good appetite and her weight has remained stable.  Her bowels generally move two to three times a week, and she denies melena or rectal bleeding.  She does not take OTC NSAIDs.  She had LFTs some time in August which were within normal limits.  CURRENT MEDICATIONS: 1. Novolin R  10 units in the a.m. and 10 units in the p.m. 2. Glucovance 5/500 q.d. 3. Doxazosin 4 mg q.d. 4. Accupril 20 mg q.d.  PAST MEDICAL HISTORY: 1. She has been hypertensive for about 15 years. 2. Diabetes mellitus was diagnosed around the same time and she has been on    insulin for about two years.  She says her glucose level generally runs    below 200.  She does not remember her last hemoglobin A1C. 3. She has had a hysterectomy. 4. Four years ago she had total colonoscopy by Dr. Elpidio Anis, which    was within normal limits.  ALLERGIES:  No known allergies.  FAMILY HISTORY:  Strongly positive for diabetes mellitus.  Mother was hypertensive, had asthma, and died of diabetic complications at age 69. Father is 37 and doing fairly well, he has hypertension.  She has two sisters and one brother.  One sister has hypertension and diabetes, the other one has hypercholesterolemia.  Brother has hypertension and diabetes.  She lost one brother of auto accident.  Another one was diabetic and died of MI at age 42.  SOCIAL HISTORY:  She is single.  She has three grown children.  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  She has been smoking cigarettes off-and-on for 20 years and generally does not smoke  more than a pack per week.  She does not drink alcohol.  PHYSICAL EXAMINATION:  GENERAL:  Pleasant mildly obese African-American female who is in no acute distress.  VITAL SIGNS:  She weighs 217-1/2 pounds.  She is 5 feet 10 inches tall.  Pulse 100 per minute, blood pressure 154/80, temperature 98.9.  HEENT:  Conjunctivae pink, sclerae nonicteric.  Oropharyngeal mucosa is normal.  NECK:  Without masses or thyromegaly.  CARDIAC:  Regular rhythm, normal S1 and S2.  No murmur or gallop noted.  LUNGS:  Clear to auscultation.  ABDOMEN:  Full.  Bowel sounds are normal.  No bruits noted.  Palpation reveals soft abdomen without tenderness, organomegaly or  masses.  RECTAL:  Guaiac negative.  EXTREMITIES:  No peripheral edema or clubbing noted.  ASSESSMENT: 1. The patient is a 65 year old female with a several month history of    intermittent episodic upper abdominal pain with nausea and vomiting.   LFTs    previously have been normal and recent ultrasound showed mildly dilated    bile duct but there is no evidence of cholelithiasis or    choledocholithiasis.  Her symptoms may or may not be secondary to biliary    colic.  She has been diabetic for over 15 years and therefore at risk    for gastroparesis but one would expect more frequent symptoms.  If she is    documented to have elevated transaminases, next would be ERCP otherwise    may consider EGD and repeat ultrasound.  RECOMMENDATIONS: 1. Will get copy of lab studies from Dr. Jorge Hanson office and repeat her LFTs.    She will have these drawn at [facility]. 2. Will review her upper abdominal ultrasound. 3. Will empirically treat her with Nexium 40 mg q.a.m. 4. Need for ERCP and/or EGD will be determined after lab studies and    ultrasound are reviewed.  I would like to thank Dr. Loleta Hanson for sharing this patients care with [place]. Dictated by:   Stacey Hanson, M.D. Attending Physician:  Stacey Hanson DD:  01/06/01 TD:  01/08/01 Job: [id number]

## 2010-07-18 NOTE — Op Note (Signed)
NAME:  Stacey Hanson, Stacey Hanson                          ACCOUNT NO.:  [id number]   MEDICAL RECORD NO.:  [id number]                   PATIENT TYPE:  INP   LOCATION:  A322                                 FACILITY:  [facility]   PHYSICIAN:  R. Roetta Sessions, M.D.              DATE OF BIRTH:  1945-06-25   DATE OF PROCEDURE:  12/19/2002  DATE OF DISCHARGE:                                 OPERATIVE REPORT   PROCEDURE:  Esophagogastroduodenoscopy.   INDICATIONS FOR PROCEDURE:  The patient is a 65 year old insulin dependent  diabetic admitted to the hospital with nausea, vomiting, upper abdominal  pain, fever and she is now being treated for urinary tract infection. HIDA  scan yesterday demonstrated gallbladder ejection fraction of 6% without any  reproduction of symptoms with half in half. EGD is now being done to rule  out a mucosal process in her upper GI tract that could be contributing to  her symptoms. It is notable the patient is on IV Protonix and Reglan and  today is feeling much better. This approach has been discussed with the  patient at the bedside and previously. Please see the documentation of  medical records.   MONITORING:  O2 saturation, blood pressure, pulse, and respirations were  monitored throughout the entirety of the procedure.   CONSCIOUS SEDATION:  Versed 3 mg IV, Demerol 75 mg IV in divided doses.  Cetacaine spray for topical oropharyngeal anesthesia.   INSTRUMENTS:  Video chip adult gastroscope.   FINDINGS:  Examination of the tubular esophagus revealed no mucosal  abnormalities. The EG junction was easily traversed.   STOMACH:  The gastric cavity was empty and insufflated well with air. A  thorough  examination of the gastric mucosa including a retroflexed view of  the proximal stomach and esophagogastric junction demonstrated no  abnormality. The pylorus was patent and easily traversed.   DUODENUM:  In the duodenum the following abnormalities were noted:  There was a  large apical bulbar diverticulum and the posterolateral mucosa  of the bulb is a 1 cm deep bulbar with a clean base that was surrounding  inflammatory changes of the mucosa. The duodenum at this level, however,  remained widely patent and the second portion was easily reached which  appeared normal.   THERAPEUTIC/DIAGNOSTIC MANEUVERS:  None.   The patient tolerated the procedure well and was reacted in endoscopy.   IMPRESSION:  1. Normal esophagus.  2. Normal stomach.  3. Duodenum large bulbar diverticulum, 1 cm bulbar ulcer with surrounding     inflammation as described above. Normal D2.   I suspect peptic ulcer may well be the main culprit producing her symptoms  recently. However, unmasked gastroparesis in the setting of acute  infection is also a possible contribute to her symptoms.   I do not think she needs to get her gallbladder out at this time.   RECOMMENDATIONS:  1. Continue proton pump inhibitor.  2. Stop Zantac.  3. Continue Reglan.  4. Advance diet.  5. Check H. pylori serologies.  6. Further recommendations to follow.      ___________________________________________                                            Jonathon Bellows, M.D.   RMR/MEDQ  D:  12/19/2002  T:  12/19/2002  Job:  [id number]   cc:   Hanley Hays. Dechurch, M.D.  [address]  [place]  [place] [address]  Fax: [phone]

## 2010-07-18 NOTE — Discharge Summary (Signed)
NAME:  Stacey Hanson, WIMER NO.:  [id number]   MEDICAL RECORD NO.:  [id number]                   PATIENT TYPE:  INP   LOCATION:  A322                                 FACILITY:  [facility]   PHYSICIAN:  Hanley Hays. Dechurch, M.D.           DATE OF BIRTH:  1945-11-01   DATE OF ADMISSION:  12/16/2002  DATE OF DISCHARGE:                                 DISCHARGE SUMMARY   DIAGNOSES:  1. Persistent nausea and vomiting secondary to peptic ulcer disease with a 1     cm bulbar ulcer with surrounding inflammation noted on     esophagogastroduodenoscopy.  2. 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  3. Chronically dilated common bile duct, unchanged.  4. Cystitis.  5. Diabetes mellitus; hemoglobin A1c is 8.6.  6. Helicobacter pylori positive at 7.6.  7. Abnormal HIDA with delayed gallbladder response to fatty meal though     asymptomatic.  8. Hypertension.  9. Hypokalemia.  10.      Anemia, mild.  Hemoglobin 11.6, hematocrit 34.5 post hydration.   MEDICATIONS:  1. Protonix 40 mg two times daily.  2. Reglan 10 mg before meals and at bedtime.  3. Prevpac - DR. Lodema Hong TO START AFTER FOLLOW-UP VISIT.  4. Glyburide 10 mg b.i.d.  5. Diovan 160 mg b.i.d.   The patient instructed to hold aspirin until follow-up.  No Advil, Aleve,  ibuprofen, etc.  May take Tylenol as needed for pain.   FOLLOW-UP:  1. Dr. Lodema Hong in seven to ten days.  2. Dr. Karilyn Cota to be arranged by their office.   HOSPITAL COURSE:  Pleasant 65 year old African-American female with diabetes  mellitus and hypertension who presented with persistent nausea and vomiting.  She had a low-grade fever.  She had some white cells in her urine.  She was  admitted for possible gastroenteritis.  She had a dilated common bile duct  on CT scan.  Ultrasound revealed the same.  The patient had a HIDA scan  which revealed abnormal emptying of the gallbladder but she was asymptomatic  after receiving half-and-half.  Her  vomiting and nausea persisted.  She  underwent endoscopy which revealed a 1 cm bulbar ulcer with inflammatory  changes around it.  There was no stigmata of acute bleeding.  Her H. pylori  interestingly came back positive.  She possibly could have some underlying  gastroparesis but she was symptomatically much improved.  Therefore, a  gastric emptying study was not performed during this hospital stay.  She was  tolerating a diet without difficulty.  Glucoses were covered with sliding  scale insulin and once daily Glyburide.  This was changed to twice daily at  discharge and she will follow up as she has been doing before with her  glucoses.  Prevpac will be needed but it was felt that she should have a  week or so of healing prior to beginning that regimen.  This was discussed  with  the patient and will be communicated to her attending M.D.  She is much  improved at the time of discharge.  Blood pressure is 127/66, temperature  afebrile, pulse is 66 and regular, respirations are unlabored.  Neck supple.  No JVD or adenopathy.  Oropharynx is moist, no lesions.  Lungs are clear to  auscultation.  Heart is regular rate and rhythm.  Abdomen is obese, soft,  nontender.  Extremities without clubbing, cyanosis, no edema.  The  neurologic exam is intact.  Skin is without rash, lesion, or breakdown.   ASSESSMENT AND PLAN:  As noted above.  Should there be any questions please  do not hesitate to contact me through the hospital service.     ___________________________________________                                         Hanley Hays Josefine Class, M.D.   FED/MEDQ  D:  12/20/2002  T:  12/20/2002  Job:  [id number]   cc:   Milus Mallick. Lodema Hong, M.D.  [address]  [place], [place] [address]  Fax: [phone]

## 2010-08-29 ENCOUNTER — Other Ambulatory Visit: Payer: Self-pay

## 2010-08-29 MED ORDER — INSULIN ASPART PROT & ASPART (70-30 MIX) 100 UNIT/ML ~~LOC~~ SUSP: SUBCUTANEOUS | Status: DC

## 2010-09-09 ENCOUNTER — Other Ambulatory Visit: Payer: Self-pay | Admitting: *Deleted

## 2010-09-09 MED ORDER — GLYBURIDE-METFORMIN 5-500 MG PO TABS: ORAL_TABLET | ORAL | Status: DC

## 2010-09-09 MED ORDER — BENAZEPRIL-HYDROCHLOROTHIAZIDE 20-12.5 MG PO TABS: ORAL_TABLET | ORAL | Status: DC

## 2010-09-09 NOTE — Progress Notes (Signed)
Refills sent as requested

## 2010-09-13 ENCOUNTER — Other Ambulatory Visit: Payer: Self-pay | Admitting: Family Medicine

## 2010-09-13 LAB — LIPID PANEL
Cholesterol: 137 mg/dL (ref 0–200)
LDL Cholesterol: 65 mg/dL (ref 0–99)
Total CHOL/HDL Ratio: 2.4 Ratio
VLDL: 16 mg/dL (ref 0–40)

## 2010-09-13 LAB — BASIC METABOLIC PANEL
BUN: 14 mg/dL (ref 6–23)
CO2: 23 mEq/L (ref 19–32)
Glucose, Bld: 100 mg/dL — ABNORMAL HIGH (ref 70–99)
Potassium: 3.8 mEq/L (ref 3.5–5.3)

## 2010-09-13 LAB — CBC WITH DIFFERENTIAL/PLATELET
Basophils Relative: 0 % (ref 0–1)
HCT: 34.4 % — ABNORMAL LOW (ref 36.0–46.0)
Hemoglobin: 11.4 g/dL — ABNORMAL LOW (ref 12.0–15.0)
Lymphs Abs: 1.9 10*3/uL (ref 0.7–4.0)
MCH: 29.7 pg (ref 26.0–34.0)
MCHC: 33.1 g/dL (ref 30.0–36.0)
Monocytes Absolute: 0.7 10*3/uL (ref 0.1–1.0)
Monocytes Relative: 9 % (ref 3–12)
Neutro Abs: 5.6 10*3/uL (ref 1.7–7.7)
RBC: 3.84 MIL/uL — ABNORMAL LOW (ref 3.87–5.11)

## 2010-09-13 LAB — HEMOGLOBIN A1C
Hgb A1c MFr Bld: 7.8 % — ABNORMAL HIGH (ref <5.7)
Mean Plasma Glucose: 177 mg/dL — ABNORMAL HIGH (ref <117)

## 2010-09-13 LAB — HEPATIC FUNCTION PANEL
ALT: 20 U/L (ref 0–35)
Bilirubin, Direct: 0.1 mg/dL (ref 0.0–0.3)
Indirect Bilirubin: 0.4 mg/dL (ref 0.0–0.9)
Total Bilirubin: 0.5 mg/dL (ref 0.3–1.2)

## 2010-09-13 LAB — TSH: TSH: 0.009 u[IU]/mL — ABNORMAL LOW (ref 0.350–4.500)

## 2010-09-15 ENCOUNTER — Encounter: Payer: Self-pay | Admitting: Family Medicine

## 2010-09-15 LAB — T4, FREE: Free T4: 2.14 ng/dL — ABNORMAL HIGH (ref 0.80–1.80)

## 2010-09-15 LAB — T3, FREE: T3, Free: 6.2 pg/mL — ABNORMAL HIGH (ref 2.3–4.2)

## 2010-09-16 ENCOUNTER — Telehealth: Payer: Self-pay | Admitting: *Deleted

## 2010-09-16 NOTE — Telephone Encounter (Signed)
Noted and that is ok.

## 2010-09-16 NOTE — Telephone Encounter (Signed)
Message copied by Diamantina Monks on Tue Sep 16, 2010 10:06 AM ------      Message from: Syliva Overman MD E      Created: Mon Sep 15, 2010  5:46 PM       pls call pt let her know her thyroid blood test is abnormal , suggesting it is overactive. I will be referring her for an US of the gland and to see a thyroid specialist, let me know if ok with her , then I will put in referral orders

## 2010-09-22 ENCOUNTER — Encounter: Payer: Self-pay | Admitting: Family Medicine

## 2010-09-22 ENCOUNTER — Ambulatory Visit (INDEPENDENT_AMBULATORY_CARE_PROVIDER_SITE_OTHER): Payer: 59 | Admitting: Family Medicine

## 2010-09-22 ENCOUNTER — Ambulatory Visit: Payer: Commercial Managed Care - PPO | Admitting: Family Medicine

## 2010-09-22 VITALS — BP 130/70 | HR 99 | Resp 16 | Ht 68.0 in | Wt 201.4 lb

## 2010-09-22 DIAGNOSIS — A048 Other specified bacterial intestinal infections: Secondary | ICD-10-CM

## 2010-09-22 DIAGNOSIS — I10 Essential (primary) hypertension: Secondary | ICD-10-CM

## 2010-09-22 DIAGNOSIS — E785 Hyperlipidemia, unspecified: Secondary | ICD-10-CM

## 2010-09-22 DIAGNOSIS — J309 Allergic rhinitis, unspecified: Secondary | ICD-10-CM | POA: Insufficient documentation

## 2010-09-22 DIAGNOSIS — R6889 Other general symptoms and signs: Secondary | ICD-10-CM

## 2010-09-22 DIAGNOSIS — R112 Nausea with vomiting, unspecified: Secondary | ICD-10-CM | POA: Insufficient documentation

## 2010-09-22 DIAGNOSIS — K219 Gastro-esophageal reflux disease without esophagitis: Secondary | ICD-10-CM

## 2010-09-22 DIAGNOSIS — R7989 Other specified abnormal findings of blood chemistry: Secondary | ICD-10-CM

## 2010-09-22 DIAGNOSIS — E109 Type 1 diabetes mellitus without complications: Secondary | ICD-10-CM

## 2010-09-22 MED ORDER — OMEPRAZOLE 20 MG PO CPDR: 20.0000 mg | DELAYED_RELEASE_CAPSULE | Freq: Every day | ORAL | Status: DC

## 2010-09-22 MED ORDER — INSULIN ASPART PROT & ASPART (70-30 MIX) 100 UNIT/ML ~~LOC~~ SUSP: SUBCUTANEOUS | Status: DC

## 2010-09-22 MED ORDER — PROMETHAZINE HCL 12.5 MG PO TABS: 12.5000 mg | ORAL_TABLET | Freq: Four times a day (QID) | ORAL | Status: AC | PRN

## 2010-09-22 MED ORDER — LORATADINE 10 MG PO TABS: 10.0000 mg | ORAL_TABLET | Freq: Every day | ORAL | Status: DC

## 2010-09-22 MED ORDER — PRAVASTATIN SODIUM 40 MG PO TABS: 40.0000 mg | ORAL_TABLET | Freq: Every day | ORAL | Status: DC

## 2010-09-22 MED ORDER — BENAZEPRIL-HYDROCHLOROTHIAZIDE 20-12.5 MG PO TABS: ORAL_TABLET | ORAL | Status: DC

## 2010-09-22 MED ORDER — GLYBURIDE-METFORMIN 5-500 MG PO TABS: ORAL_TABLET | ORAL | Status: DC

## 2010-09-22 NOTE — Progress Notes (Signed)
  Subjective:    Patient ID: Stacey Hanson, female    DOB: 1946-01-12, 65 y.o.   MRN: [id number]  HPI Pt reports she has not felt well for approx 2 months, just after Mother's day. Nausea on average  3 to 4 days per [facility], she has also been vomiting about 2 to 3 times per week. She has been tired and excessively weak, with heat intolerance. Tests blood sugar twice daily fluctuates between 185 in the morning down to 130 Pt in for f/u chronic ilnesses and review of recent lab data  Review of Systems Denies recent fever or chills. Denies sinus pressure, nasal congestion, ear pain or sore throat. Denies chest congestion, productive cough or wheezing. Denies chest pains, palpitations and leg swelling Denies diarrhea or constipation.  Denies rectal bleeding or change in character of stool Denies dysuria, frequency, hesitancy or incontinence. chronic back pain, somewhat improved Denies headaches, seizures, numbness, or tingling. Denies depression, anxiety or insomnia. Denies skin break down or rash.        Objective:   Physical Exam    Patient alert and oriented and in no cardiopulmonary distress.  HEENT: No facial asymmetry, EOMI, no sinus tenderness,  oropharynx pink and moist.  Neck supple no adenopathy.  Chest: Clear to auscultation bilaterally.  CVS: S1, S2 no murmurs, no S3.  ABD: Epigastric tenderness, no guarding or rebound. Bowel sounds normal.  Ext: No edema  MS: decreased  ROM spine, adequate in shoulders, hips and knees.  Skin: Intact, no ulcerations or rash noted.  Psych: Good eye contact, normal affect. Memory mildly impaired, not anxious or depressed appearing.  CNS: CN 2-12 intact, power, tone and sensation normal throughout. Diabetic Foot Check:  Appearance - no lesions, ulcers or calluses Skin - no unusual pallor or redness Sensation - grossly intact to light touch Monofilament testing -  Right - Great toe, medial, central, lateral ball and posterior foot  diminished Left - Great toe, medial, central, lateral ball and posterior foot diminished Pulses Left - Dorsalis Pedis and Posterior Tibia normal Right - Dorsalis Pedis and Posterior Tibia normal     Assessment & Plan:

## 2010-09-22 NOTE — Patient Instructions (Addendum)
F/U in 3 months.  You are being referred for an [place] of your tyroid gland as well as to see the endocrinologist Dr Fransico Him it is overactive  H pylori today  You will be referred to Dr Darrick Penna for evaluation of uncontrolled nausea and vomiting x 2 months  Your blood sugar is high, pls increase evening insulin from 20 units to 25 units.  OK to take omeprazole one daily till you see Dr Shirline Frees to take the claritin one daily as needed for allergies HBA1c and cMP eGFR lipids fasting  in 3 months.  Pls call if you need me before.  Med is sent to [facility] per your request for nausea. Do not take more than 1 daily, it has a drowsy side effect  Pls prepare for the pneumonia vaccine you need it now

## 2010-09-23 LAB — MICROALBUMIN / CREATININE URINE RATIO
Creatinine, Urine: 99.7 mg/dL
Microalb Creat Ratio: 5.7 mg/g (ref 0.0–30.0)

## 2010-09-23 LAB — H. PYLORI ANTIBODY, IGG: H Pylori IgG: 1.11 {ISR} — ABNORMAL HIGH

## 2010-09-24 DIAGNOSIS — A048 Other specified bacterial intestinal infections: Secondary | ICD-10-CM | POA: Insufficient documentation

## 2010-09-24 HISTORY — DX: Other specified bacterial intestinal infections: A04.8

## 2010-09-24 MED ORDER — AMOXICILL-CLARITHRO-LANSOPRAZ PO MISC: Freq: Two times a day (BID) | ORAL | Status: DC

## 2010-09-24 NOTE — Assessment & Plan Note (Signed)
Chronic nausea, new dx will treat

## 2010-09-25 ENCOUNTER — Other Ambulatory Visit (HOSPITAL_COMMUNITY): Payer: Self-pay | Admitting: "Endocrinology

## 2010-09-25 ENCOUNTER — Ambulatory Visit (INDEPENDENT_AMBULATORY_CARE_PROVIDER_SITE_OTHER): Payer: 59 | Admitting: Gastroenterology

## 2010-09-25 ENCOUNTER — Encounter: Payer: Self-pay | Admitting: Gastroenterology

## 2010-09-25 DIAGNOSIS — D649 Anemia, unspecified: Secondary | ICD-10-CM

## 2010-09-25 DIAGNOSIS — E0590 Thyrotoxicosis, unspecified without thyrotoxic crisis or storm: Secondary | ICD-10-CM

## 2010-09-25 DIAGNOSIS — R112 Nausea with vomiting, unspecified: Secondary | ICD-10-CM

## 2010-09-25 NOTE — Patient Instructions (Signed)
YOUR VOMITING AND WEIGHT LOSS ARE MOST LIKELY DUE to REFLUX AND POSSIBLY H. PYLORI GASTRITIS. Complete Abx for H. Pylori. CONTINUE OMEPRAZOLE. Take it 30 minutes prior to you first meal. Follow a low fat diet. FOLLOW GERD LIFESTYLE REMEDIES RECOMMENDATIONS BELOW. Lose 10 lbs. Follow up in 4 mos.  [email or web address]  RISK FACTORS FOR GERD   Obesity: your body mass index is 30 consistent with obesity.   CARBONATED BEVERAGES   CAFFEINE   FATTY FOODS  Lifestyle and home remedies You may eliminate or reduce the frequency of heartburn by making the following lifestyle changes:   Control your weight. Being overweight is a major risk factor for heartburn and GERD. Excess pounds put pressure on your abdomen, pushing up your stomach and causing acid to back up into your esophagus.    Eat 4-6 small meals daily. This reduces pressure on the lower esophageal sphincter, helping to prevent the valve from opening and acid from washing back into your esophagus.    Loosen your belt. Clothes that fit tightly around your waist put pressure on your abdomen and the lower esophageal sphincter.    Eliminate heartburn triggers. Everyone has specific triggers. Common triggers such as fatty or fried foods, spicy food, tomato sauce, carbonated beverages, alcohol, chocolate, mint, garlic, onion, AND caffeine may make heartburn worse.    Avoid stooping or bending. Tying your shoes is OK. Bending over for longer periods to weed your garden isn't, especially soon after eating.    Don't lie down after a meal. Wait at least three to four hours after eating before going to bed, and don't lie down right after eating.    Alternative medicine   Several home remedies exist for treating GERD, but they provide only temporary relief. They include drinking baking soda (sodium bicarbonate) added to water or drinking other fluids such as baking soda mixed with cream of tartar and water.   Although these liquids create temporary  relief by neutralizing, washing away or buffering acids, eventually they aggravate the situation by adding gas and fluid to your stomach, increasing pressure and causing more acid reflux. Further, adding more sodium to your diet may increase your blood pressure and add stress to your heart, and excessive bicarbonate ingestion can alter the acid-base balance in your body.

## 2010-09-25 NOTE — Progress Notes (Signed)
Reminder in epic to follow up in 4 months °

## 2010-09-25 NOTE — Progress Notes (Signed)
Subjective:    Patient ID: Stacey Hanson, female    DOB: Feb 02, 1946, 65 y.o.   MRN: [id number]  PCP: [facility]PS[facility]  HPI STARTED ALL OF A SUDDEN on Mother's day weekend. Was occasional and then came every day. Would feel sick at stomach, started in throat and down to her stomach and then she would throw up. At first it was twice a day after eating. Felt heartburn & indigestion during the episode. Took Ibuprofen for back problems x1 mo-last dose before May. No ASA, BC, or Goody's. Just taking Claritin. No PPI or H2Bs. Weight loss: over 3-4 mos, 16 lbs. TCS 2009. 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 No abd pain. Bms: pretty good. Been DM since 1987.   Past Medical History  Diagnosis Date  . 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 Knee pain   . Obesity   . Hyperlipidemia   . Hypertension   . IDDM (insulin dependent diabetes mellitus) 1987   Past Surgical History  Procedure Date  . Vesicovaginal fistula closure w/ tah 1970  . Cholecystectomy OCT 2010 BZ BILIARY DYSKINESIA    CHRONIC CHOLECYSTITIS  . Colonoscopy MAY 2009 SCREENING    pTC TICS, SML IH  . Total abdominal hysterectomy 1970  . Upper gastrointestinal endoscopy OCT 2004 RMR    DUO TIC, & ULCER   Allergies  Allergen Reactions  . Simvastatin     REACTION: muscle cramps    Current Outpatient Prescriptions  Medication Sig Dispense Refill  . amoxicillin-clarithromycin-lansoprazole (PREVPAC) combo pack Take by mouth 2 (two) times daily. Follow package directions.  1 kit  0  . benazepril-hydrochlorthiazide (LOTENSIN HCT) 20-12.5 MG per tablet Take 2 tabs by mouth in the mornings  180 tablet  0  . Blood Glucose Monitoring Suppl (TRUETRACK BLOOD GLUCOSE) W/DEVICE KIT by Does not apply route. Two times a day       . Blood Glucose Monitoring Suppl (TRUETRACK SMART SYSTEM) KIT by Does not apply route 2 (two) times daily. Two times a day testing       . Calcium Carbonate-Vitamin D (CALCIUM PLUS VITAMIN D PO) Take by mouth 2 (two)  times daily. Take two tablets daily       . glucose blood (TRUETRACK TEST) test strip 1 each by Other route 2 (two) times daily. Two times a day       . glyBURIDE-metformin (GLUCOVANCE) 5-500 MG per tablet Take two tablets by mouth twice a day  360 tablet  0  . ibuprofen (ADVIL,MOTRIN) 200 MG tablet Take 200 mg by mouth as needed. As needed       . insulin aspart protamine-insulin aspart (NOVOLOG MIX 70/30 FLEXPEN) (70-30) 100 UNIT/ML injection Dose increase effective 09/22/2010: 27 units in the morning and 25 units in the evenings  10 mL  12  . Insulin Pen Needle 29G X 12.7MM MISC by Does not apply route. Use as directed       . Insulin Syringe-Needle U-100 (B-D INSULIN SYRINGE) 31G X 5/16" 0.3 ML MISC by Does not apply route. Two times a day       . loratadine (CLARITIN) 10 MG tablet Take 1 tablet (10 mg total) by mouth daily.  30 tablet  2  . omeprazole (PRILOSEC) 20 MG capsule Take 1 capsule (20 mg total) by mouth daily.  30 capsule  1  . pravastatin (PRAVACHOL) 40 MG tablet Take 1 tablet (40 mg total) by mouth daily.  90 tablet  0  .  promethazine (PHENERGAN) 12.5 MG tablet Take 1 tablet (12.5 mg total) by mouth every 6 (six) hours as needed for nausea.  30 tablet  0    Family History  Problem Relation Age of Onset  . Diabetes Mother   . Hypertension Mother   . Heart failure Mother   . Cancer Father     prostate and colon  . Diabetes Sister   . Hypertension Sister   . Diabetes Brother   . Hypertension Brother   . Coronary artery disease Brother   . Colon cancer Neg Hx   . Colon polyps Neg Hx     History   Social History  . Marital Status: Single   Occupational History  . [employer]-NOW RETIRED    Social History Main Topics  . Smoking status: Former Smoker    Types: Cigarettes  .    Marland Kitchen Alcohol Use: No  . Drug Use: No   Review of Systems  All other systems reviewed and are negative.  H. PYLORI SERO POS     Objective:   Physical Exam  Vitals reviewed. Constitutional:  She is oriented to person, place, and time. She appears well-developed and well-nourished. She appears distressed.  HENT:  Head: Normocephalic and atraumatic.  Mouth/Throat: Oropharynx is clear and moist. No oropharyngeal exudate.  Eyes: Pupils are equal, round, and reactive to light. No scleral icterus.  Neck: Normal range of motion. Neck supple.  Cardiovascular: Normal rate, regular rhythm and normal heart sounds.   Pulmonary/Chest: Effort normal and breath sounds normal.  Abdominal: Soft. Bowel sounds are normal. She exhibits no distension. There is no tenderness.  Musculoskeletal: She exhibits no edema.  Lymphadenopathy:    She has no cervical adenopathy.  Neurological: She is alert and oriented to person, place, and time.       FOCAL DEFICITS  Psychiatric: She has a normal mood and affect.          Assessment & Plan:

## 2010-09-26 ENCOUNTER — Ambulatory Visit (HOSPITAL_COMMUNITY)
Admission: RE | Admit: 2010-09-26 | Discharge: 2010-09-26 | Disposition: A | Discharge status: Home / Self Care | Payer: 59 | Source: Ambulatory Visit | Attending: Family Medicine | Admitting: Family Medicine

## 2010-09-26 ENCOUNTER — Encounter: Payer: Self-pay | Admitting: Gastroenterology

## 2010-09-26 DIAGNOSIS — D649 Anemia, unspecified: Secondary | ICD-10-CM | POA: Insufficient documentation

## 2010-09-26 DIAGNOSIS — R946 Abnormal results of thyroid function studies: Secondary | ICD-10-CM | POA: Insufficient documentation

## 2010-09-26 DIAGNOSIS — E0789 Other specified disorders of thyroid: Secondary | ICD-10-CM | POA: Insufficient documentation

## 2010-09-26 DIAGNOSIS — R7989 Other specified abnormal findings of blood chemistry: Secondary | ICD-10-CM

## 2010-09-26 NOTE — Assessment & Plan Note (Signed)
Most likely 2o to H. Pylori gastritis, doubt PUD or gastric CA.  EGD to evaluate anemia and weight loss.

## 2010-09-26 NOTE — Assessment & Plan Note (Addendum)
YOUR VOMITING AND WEIGHT LOSS ARE MOST LIKELY DUE to REFLUX AND POSSIBLY H. PYLORI GASTRITIS. Complete Abx for H. Pylori. CONTINUE OMEPRAZOLE. Take it 30 minutes prior to you first meal. Follow a low fat diet. FOLLOW GERD LIFESTYLE REMEDIES RECOMMENDATIONS BELOW. Lose 10 lbs. EGD to evaluate anemia and for PUD as etiology for low Hb. Follow up in 4 mos.

## 2010-09-28 NOTE — Assessment & Plan Note (Signed)
Deteriorated, dose increase on medication

## 2010-09-28 NOTE — Assessment & Plan Note (Signed)
Controlled, no change in medication  

## 2010-09-28 NOTE — Assessment & Plan Note (Signed)
Pt extremely symptomatic, check h pylori and gI eval

## 2010-09-28 NOTE — Assessment & Plan Note (Signed)
Symptoms , and labs conssitent with overactive gland, referred to endocrine

## 2010-09-29 ENCOUNTER — Other Ambulatory Visit: Payer: Self-pay | Admitting: Family Medicine

## 2010-09-29 DIAGNOSIS — E079 Disorder of thyroid, unspecified: Secondary | ICD-10-CM

## 2010-09-29 NOTE — Progress Notes (Signed)
Cc to PCP 

## 2010-09-30 ENCOUNTER — Encounter (HOSPITAL_COMMUNITY): Payer: Self-pay

## 2010-09-30 ENCOUNTER — Encounter (HOSPITAL_COMMUNITY)
Admission: RE | Admit: 2010-09-30 | Discharge: 2010-09-30 | Disposition: A | Discharge status: Home / Self Care | Payer: 59 | Source: Ambulatory Visit | Attending: "Endocrinology | Admitting: "Endocrinology

## 2010-09-30 DIAGNOSIS — E0789 Other specified disorders of thyroid: Secondary | ICD-10-CM | POA: Insufficient documentation

## 2010-09-30 DIAGNOSIS — E0590 Thyrotoxicosis, unspecified without thyrotoxic crisis or storm: Secondary | ICD-10-CM | POA: Insufficient documentation

## 2010-09-30 MED ORDER — SODIUM IODIDE I 131 CAPSULE
10.0000 | Freq: Once | INTRAVENOUS | Status: AC | PRN
  Administered 2010-09-30: 8 via ORAL

## 2010-10-01 ENCOUNTER — Encounter (HOSPITAL_COMMUNITY)
Admission: RE | Admit: 2010-10-01 | Discharge: 2010-10-01 | Disposition: A | Discharge status: Home / Self Care | Payer: Medicare Other | Source: Ambulatory Visit | Attending: "Endocrinology | Admitting: "Endocrinology

## 2010-10-01 MED ORDER — SODIUM PERTECHNETATE TC 99M INJECTION
10.0000 | Freq: Once | INTRAVENOUS | Status: AC | PRN
  Administered 2010-10-01: 9.7 via INTRAVENOUS

## 2010-10-02 ENCOUNTER — Other Ambulatory Visit (HOSPITAL_COMMUNITY): Payer: Self-pay | Admitting: "Endocrinology

## 2010-10-02 DIAGNOSIS — E0590 Thyrotoxicosis, unspecified without thyrotoxic crisis or storm: Secondary | ICD-10-CM

## 2010-10-03 ENCOUNTER — Encounter (HOSPITAL_COMMUNITY)
Admission: RE | Disposition: A | Discharge status: Home / Self Care | Payer: Self-pay | Source: Ambulatory Visit | Attending: Gastroenterology

## 2010-10-03 ENCOUNTER — Encounter (HOSPITAL_COMMUNITY): Payer: Self-pay | Admitting: *Deleted

## 2010-10-03 ENCOUNTER — Encounter: Payer: 59 | Admitting: Gastroenterology

## 2010-10-03 ENCOUNTER — Other Ambulatory Visit: Payer: Self-pay | Admitting: Gastroenterology

## 2010-10-03 ENCOUNTER — Ambulatory Visit (HOSPITAL_COMMUNITY)
Admission: RE | Admit: 2010-10-03 | Discharge: 2010-10-03 | Disposition: A | Discharge status: Home / Self Care | Payer: Medicare Other | Source: Ambulatory Visit | Attending: Gastroenterology | Admitting: Gastroenterology

## 2010-10-03 DIAGNOSIS — Z79899 Other long term (current) drug therapy: Secondary | ICD-10-CM | POA: Insufficient documentation

## 2010-10-03 DIAGNOSIS — I10 Essential (primary) hypertension: Secondary | ICD-10-CM | POA: Insufficient documentation

## 2010-10-03 DIAGNOSIS — Z794 Long term (current) use of insulin: Secondary | ICD-10-CM | POA: Insufficient documentation

## 2010-10-03 DIAGNOSIS — K5710 Diverticulosis of small intestine without perforation or abscess without bleeding: Secondary | ICD-10-CM

## 2010-10-03 DIAGNOSIS — K222 Esophageal obstruction: Secondary | ICD-10-CM

## 2010-10-03 DIAGNOSIS — R1310 Dysphagia, unspecified: Secondary | ICD-10-CM | POA: Insufficient documentation

## 2010-10-03 DIAGNOSIS — K2990 Gastroduodenitis, unspecified, without bleeding: Secondary | ICD-10-CM

## 2010-10-03 DIAGNOSIS — K2970 Gastritis, unspecified, without bleeding: Secondary | ICD-10-CM

## 2010-10-03 DIAGNOSIS — E785 Hyperlipidemia, unspecified: Secondary | ICD-10-CM | POA: Insufficient documentation

## 2010-10-03 DIAGNOSIS — E119 Type 2 diabetes mellitus without complications: Secondary | ICD-10-CM | POA: Insufficient documentation

## 2010-10-03 DIAGNOSIS — Z01812 Encounter for preprocedural laboratory examination: Secondary | ICD-10-CM | POA: Insufficient documentation

## 2010-10-03 DIAGNOSIS — K219 Gastro-esophageal reflux disease without esophagitis: Secondary | ICD-10-CM

## 2010-10-03 HISTORY — PX: ESOPHAGOGASTRODUODENOSCOPY: SHX5428

## 2010-10-03 HISTORY — PX: SAVORY DILATION: SHX5439

## 2010-10-03 SURGERY — EGD (ESOPHAGOGASTRODUODENOSCOPY)
Anesthesia: Moderate Sedation

## 2010-10-03 MED ORDER — MEPERIDINE HCL 100 MG/ML IJ SOLN
INTRAMUSCULAR | Status: DC | PRN
  Administered 2010-10-03: 50 mg via INTRAVENOUS

## 2010-10-03 MED ORDER — BUTAMBEN-TETRACAINE-BENZOCAINE 2-2-14 % EX AERO
INHALATION_SPRAY | CUTANEOUS | Status: DC | PRN
  Administered 2010-10-03: 2 via TOPICAL

## 2010-10-03 MED ORDER — MINERAL OIL PO OIL
TOPICAL_OIL | ORAL | Status: AC
  Filled 2010-10-03: qty 30

## 2010-10-03 MED ORDER — STERILE WATER FOR IRRIGATION IR SOLN
Status: DC | PRN
  Administered 2010-10-03: 12:00:00

## 2010-10-03 MED ORDER — OMEPRAZOLE 20 MG PO CPDR: 20.0000 mg | DELAYED_RELEASE_CAPSULE | Freq: Every day | ORAL | Status: DC

## 2010-10-03 MED ORDER — PROMETHAZINE HCL 25 MG/ML IJ SOLN
INTRAMUSCULAR | Status: AC
  Filled 2010-10-03: qty 1

## 2010-10-03 MED ORDER — MEPERIDINE HCL 100 MG/ML IJ SOLN
INTRAMUSCULAR | Status: AC
  Filled 2010-10-03: qty 2

## 2010-10-03 MED ORDER — SODIUM CHLORIDE 0.45 % IV SOLN
Freq: Once | INTRAVENOUS | Status: AC
  Administered 2010-10-03: 11:00:00 via INTRAVENOUS

## 2010-10-03 MED ORDER — MIDAZOLAM HCL 5 MG/5ML IJ SOLN
INTRAMUSCULAR | Status: AC
  Filled 2010-10-03: qty 10

## 2010-10-03 MED ORDER — MIDAZOLAM HCL 5 MG/5ML IJ SOLN
INTRAMUSCULAR | Status: DC | PRN
  Administered 2010-10-03: 2 mg via INTRAVENOUS

## 2010-10-03 NOTE — Interval H&P Note (Signed)
History and Physical Interval Note:   10/03/2010   11:23 AM   Stacey Hanson  has presented today for surgery, with the diagnosis of vomiting, wt.loss  The various methods of treatment have been discussed with the patient and family. After consideration of risks, benefits and other options for treatment, the patient has consented to  Procedure(s): ESOPHAGOGASTRODUODENOSCOPY (EGD) as a surgical intervention .  I have reviewed the patients' chart and labs.  Questions were answered to the patient's satisfaction.     [name]  MD

## 2010-10-03 NOTE — H&P (Signed)
  In epic 7/26

## 2010-10-07 ENCOUNTER — Telehealth: Payer: Self-pay | Admitting: Gastroenterology

## 2010-10-07 NOTE — Telephone Encounter (Signed)
Please call pt. She has mild gastritis. Complete Prevpak. Avoid high fat foods. OPV in 3 mos to reassess swallowing and NV.

## 2010-10-07 NOTE — Telephone Encounter (Signed)
Results Cc to PCP  

## 2010-10-08 NOTE — Telephone Encounter (Signed)
Pt aware, please schedule ov 3 months

## 2010-10-09 ENCOUNTER — Other Ambulatory Visit (HOSPITAL_COMMUNITY): Payer: Self-pay | Admitting: "Endocrinology

## 2010-10-09 ENCOUNTER — Other Ambulatory Visit (HOSPITAL_COMMUNITY): Payer: Medicare Other

## 2010-10-09 ENCOUNTER — Ambulatory Visit (HOSPITAL_COMMUNITY)
Admit: 2010-10-09 | Discharge: 2010-10-09 | Disposition: A | Discharge status: Home / Self Care | Payer: Medicare Other | Source: Ambulatory Visit | Attending: "Endocrinology | Admitting: "Endocrinology

## 2010-10-09 ENCOUNTER — Ambulatory Visit (HOSPITAL_COMMUNITY): Payer: Medicare Other

## 2010-10-09 DIAGNOSIS — E0590 Thyrotoxicosis, unspecified without thyrotoxic crisis or storm: Secondary | ICD-10-CM

## 2010-10-09 DIAGNOSIS — E079 Disorder of thyroid, unspecified: Secondary | ICD-10-CM | POA: Insufficient documentation

## 2010-10-09 NOTE — Progress Notes (Signed)
2cc Lidocaine injected into left thyroid node.

## 2010-10-10 ENCOUNTER — Encounter (HOSPITAL_COMMUNITY): Payer: Self-pay | Admitting: Gastroenterology

## 2010-10-10 ENCOUNTER — Ambulatory Visit (HOSPITAL_COMMUNITY): Payer: Medicare Other

## 2010-10-10 ENCOUNTER — Encounter (HOSPITAL_COMMUNITY): Payer: 59

## 2010-10-10 ENCOUNTER — Encounter (HOSPITAL_COMMUNITY): Payer: Medicare Other

## 2010-10-13 NOTE — Telephone Encounter (Signed)
Reminder in epic to follow up in 3 months °

## 2010-10-17 ENCOUNTER — Encounter (HOSPITAL_COMMUNITY)
Admission: RE | Admit: 2010-10-17 | Discharge: 2010-10-17 | Disposition: A | Discharge status: Home / Self Care | Payer: Medicare Other | Source: Ambulatory Visit | Attending: "Endocrinology | Admitting: "Endocrinology

## 2010-10-17 DIAGNOSIS — E0590 Thyrotoxicosis, unspecified without thyrotoxic crisis or storm: Secondary | ICD-10-CM | POA: Insufficient documentation

## 2010-10-17 MED ORDER — SODIUM IODIDE I 131 CAPSULE
30.0000 | Freq: Once | INTRAVENOUS | Status: AC | PRN
  Administered 2010-10-17: 30 via ORAL

## 2010-11-26 ENCOUNTER — Telehealth: Payer: Self-pay | Admitting: Family Medicine

## 2010-11-26 DIAGNOSIS — E109 Type 1 diabetes mellitus without complications: Secondary | ICD-10-CM

## 2010-11-26 MED ORDER — INSULIN PEN NEEDLE 29G X 12.7MM MISC: Status: DC

## 2010-11-26 MED ORDER — INSULIN ASPART PROT & ASPART (70-30 MIX) 100 UNIT/ML ~~LOC~~ SUSP: SUBCUTANEOUS | Status: DC

## 2010-11-26 NOTE — Telephone Encounter (Signed)
Printed and patient aware

## 2010-12-02 ENCOUNTER — Encounter: Payer: Self-pay | Admitting: Gastroenterology

## 2010-12-11 ENCOUNTER — Telehealth: Payer: Self-pay | Admitting: Family Medicine

## 2010-12-12 LAB — BASIC METABOLIC PANEL
Calcium: 9.8
GFR calc Af Amer: >60
GFR calc non Af Amer: >60
Potassium: 4.4
Sodium: 139

## 2010-12-19 LAB — LIPID PANEL
Cholesterol: 169 mg/dL (ref 0–200)
LDL Cholesterol: 95 mg/dL (ref 0–99)
Total CHOL/HDL Ratio: 2.9 Ratio
VLDL: 16 mg/dL (ref 0–40)

## 2010-12-19 LAB — COMPLETE METABOLIC PANEL WITH GFR
ALT: 21 U/L (ref 0–35)
AST: 20 U/L (ref 0–37)
Alkaline Phosphatase: 88 U/L (ref 39–117)
BUN: 19 mg/dL (ref 6–23)
Creat: 0.93 mg/dL (ref 0.50–1.10)
Total Bilirubin: 0.4 mg/dL (ref 0.3–1.2)

## 2010-12-19 LAB — HEMOGLOBIN A1C
Hgb A1c MFr Bld: 7.8 % — ABNORMAL HIGH (ref <5.7)
Mean Plasma Glucose: 177 mg/dL — ABNORMAL HIGH (ref <117)

## 2010-12-24 ENCOUNTER — Encounter: Payer: Self-pay | Admitting: Orthopedic Surgery

## 2010-12-24 ENCOUNTER — Ambulatory Visit (INDEPENDENT_AMBULATORY_CARE_PROVIDER_SITE_OTHER): Payer: Medicare Other | Admitting: Orthopedic Surgery

## 2010-12-24 ENCOUNTER — Ambulatory Visit: Payer: 59 | Admitting: Family Medicine

## 2010-12-24 ENCOUNTER — Ambulatory Visit: Payer: 59 | Admitting: Orthopedic Surgery

## 2010-12-24 ENCOUNTER — Encounter: Payer: Self-pay | Admitting: Family Medicine

## 2010-12-24 VITALS — Ht 68.0 in | Wt 210.0 lb

## 2010-12-24 DIAGNOSIS — M1710 Unilateral primary osteoarthritis, unspecified knee: Secondary | ICD-10-CM

## 2010-12-24 NOTE — Patient Instructions (Signed)
Come back 1 year

## 2010-12-24 NOTE — Progress Notes (Signed)
Routine followup.  A/6 month x-ray RIGHT knee.  The patient is retired. She's not having any pain in her knee at this time. Other than some aching with weather changes. The LEFT is not bothering her. No catching, locking, or giving way.  Clinical exam shows that the LEFT knee is in severe valgus RIGHT knee is in mild valgus. She still has a greater than 115 of knee flexion in both knees and both knees are stable. Muscle tone is normal. No joint effusions are seen.  Separate x-ray report [facility], lateral, and central patellar view, RIGHT knee.  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  Impression RIGHT knee arthritis.  Followup one year for x-rays, again or sooner if symptoms warrant.

## 2010-12-25 ENCOUNTER — Ambulatory Visit (INDEPENDENT_AMBULATORY_CARE_PROVIDER_SITE_OTHER): Payer: Medicare Other | Admitting: Family Medicine

## 2010-12-25 ENCOUNTER — Encounter: Payer: Self-pay | Admitting: Family Medicine

## 2010-12-25 VITALS — BP 130/70 | HR 92 | Resp 16 | Ht 68.0 in | Wt 211.1 lb

## 2010-12-25 DIAGNOSIS — E785 Hyperlipidemia, unspecified: Secondary | ICD-10-CM

## 2010-12-25 DIAGNOSIS — I10 Essential (primary) hypertension: Secondary | ICD-10-CM

## 2010-12-25 DIAGNOSIS — E0590 Thyrotoxicosis, unspecified without thyrotoxic crisis or storm: Secondary | ICD-10-CM

## 2010-12-25 DIAGNOSIS — E109 Type 1 diabetes mellitus without complications: Secondary | ICD-10-CM

## 2010-12-25 MED ORDER — PRAVASTATIN SODIUM 40 MG PO TABS: 40.0000 mg | ORAL_TABLET | Freq: Every day | ORAL | Status: DC

## 2010-12-25 MED ORDER — BENAZEPRIL-HYDROCHLOROTHIAZIDE 20-12.5 MG PO TABS: ORAL_TABLET | ORAL | Status: DC

## 2010-12-25 MED ORDER — INSULIN ASPART PROT & ASPART (70-30 MIX) 100 UNIT/ML ~~LOC~~ SUSP: 30.0000 [IU] | Freq: Two times a day (BID) | SUBCUTANEOUS | Status: DC

## 2010-12-25 NOTE — Patient Instructions (Addendum)
F/U in 3 months.  Blood pressure and cholesterol are great   Diabetes is still out of control.  Increase in  dose of insulin to 30 units twice daily.  Please call and attend group class at the hospital to improve your eating habits for better blood sugar control and better understanding of diabetes   HBa1C and chem 7 in 3 months  Goal for fasting blood sugar ranges from 80 to 120 and 2 hours after any meal or at bedtime should be between 130 to 170.Call if blood sugars remain high between visits   I hope to stop the glyburide/metformin, and add a 2nd insulin levmir, pease check the price , I will call later.

## 2010-12-26 ENCOUNTER — Telehealth: Payer: Self-pay | Admitting: Family Medicine

## 2010-12-26 MED ORDER — GLYBURIDE-METFORMIN 5-500 MG PO TABS: ORAL_TABLET | ORAL | Status: DC

## 2010-12-26 NOTE — Telephone Encounter (Signed)
Patient needs to call office

## 2010-12-26 NOTE — Telephone Encounter (Signed)
pls send the medication she requests in to her mail order and let her know

## 2010-12-26 NOTE — Telephone Encounter (Signed)
Patient aware.

## 2010-12-26 NOTE — Telephone Encounter (Signed)
Doesn't want to use the levemir. Wants back on the glyburide. Send to her mail order

## 2010-12-28 DIAGNOSIS — E890 Postprocedural hypothyroidism: Secondary | ICD-10-CM | POA: Insufficient documentation

## 2010-12-28 NOTE — Assessment & Plan Note (Signed)
Uncontrolled, dose of insulin increased, ideally would like pt only on insulin at this time. She is encouraged to attend diabetic ed classes once more

## 2010-12-28 NOTE — Assessment & Plan Note (Signed)
Treated by endo with ablation, marked symptom improvement, being followed by endo on replacement therapy

## 2010-12-28 NOTE — Assessment & Plan Note (Signed)
Controlled, no change in medication  

## 2010-12-28 NOTE — Progress Notes (Signed)
  Subjective:    Patient ID: Stacey Hanson, female    DOB: 10/15/1945, 65 y.o.   MRN: [id number]  HPI The PT is here for follow up and re-evaluation of chronic medical conditions, medication management and review of any available recent lab and radiology data.  Preventive health is updated, specifically  Cancer screening and Immunization.   Questions or concerns regarding consultations or procedures which the PT has had in the interim are  Addressed.Feels much improved since being treated for hyperthyroidism The PT denies any adverse reactions to current medications since the last visit.  There are no new concerns.  There are no specific complaints . Reports she has needed test supplies and is aware that her blood sugars are higher than they should be. Denies polyuria, polydipsia, blurred vision or hypoglycemic episodes      Review of Systems See HPI Denies recent fever or chills. Denies sinus pressure, nasal congestion, ear pain or sore throat. Denies chest congestion, productive cough or wheezing. Denies chest pains, palpitations and leg swelling Denies abdominal pain, nausea, vomiting,diarrhea or constipation.   Denies dysuria, frequency, hesitancy or incontinence. Denies joint pain, swelling and limitation in mobility. Denies headaches, seizures, numbness, or tingling. Denies depression, anxiety or insomnia. Denies skin break down or rash.        Objective:   Physical Exam Patient alert and oriented and in no cardiopulmonary distress.  HEENT: No facial asymmetry, EOMI, no sinus tenderness,  oropharynx pink and moist.  Neck supple no adenopathy.  Chest: Clear to auscultation bilaterally.  CVS: S1, S2 no murmurs, no S3.  ABD: Soft non tender. Bowel sounds normal.  Ext: No edema  MS: Adequate ROM spine, shoulders, hips and knees.  Skin: Intact, no ulcerations or rash noted.  Psych: Good eye contact, normal affect. Memory intact not anxious or depressed  appearing.  CNS: CN 2-12 intact, power, tone and sensation normal throughout.        Assessment & Plan:

## 2010-12-30 ENCOUNTER — Encounter: Payer: Self-pay | Admitting: Internal Medicine

## 2011-01-08 ENCOUNTER — Encounter: Payer: Self-pay | Admitting: Gastroenterology

## 2011-01-08 ENCOUNTER — Ambulatory Visit (INDEPENDENT_AMBULATORY_CARE_PROVIDER_SITE_OTHER): Payer: Medicare Other | Admitting: Gastroenterology

## 2011-01-08 VITALS — BP 121/79 | HR 92 | Temp 97.6°F | Ht 68.0 in | Wt 215.4 lb

## 2011-01-08 DIAGNOSIS — R1310 Dysphagia, unspecified: Secondary | ICD-10-CM

## 2011-01-08 DIAGNOSIS — K219 Gastro-esophageal reflux disease without esophagitis: Secondary | ICD-10-CM

## 2011-01-08 MED ORDER — OMEPRAZOLE 20 MG PO CPDR: 20.0000 mg | DELAYED_RELEASE_CAPSULE | Freq: Every day | ORAL | Status: DC

## 2011-01-08 NOTE — Progress Notes (Signed)
Subjective:    Patient ID: Stacey Hanson, female    DOB: 1945-12-21, 65 y.o.   MRN: [id number]  PCP: [facility]  HPI NO NAUSEA OR VOMITING. No problems swallowing. Bms: doing good-1-2x/day. No abdominal pain.  Past Medical History  Diagnosis Date  . 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 Knee pain   . Obesity   . Hyperlipidemia   . Hypertension   . IDDM (insulin dependent diabetes mellitus) 1987  . Diabetes mellitus   . BMI 30.0-30.9,adult 2012 215 LBS    2004 198 LBS    Past Surgical History  Procedure Date  . Vesicovaginal fistula closure w/ tah 1970  . Cholecystectomy OCT 2010 BZ BILIARY DYSKINESIA    CHRONIC CHOLECYSTITIS  . Colonoscopy MAY 2009 SCREENING    pTC TICS, SML IH  . Total abdominal hysterectomy 1970  . Upper gastrointestinal endoscopy OCT 2004 RMR    DUO TIC, & ULCER  . Esophagogastroduodenoscopy 10/03/2010    Procedure: ESOPHAGOGASTRODUODENOSCOPY (EGD);  Surgeon: Arlyce Harman, MD;  Location: [facility];  Service: Endoscopy;  Laterality: N/A;  . Savory dilation 10/03/2010    Procedure: SAVORY DILATION;  Surgeon: Arlyce Harman, MD;  Location: [facility];  Service: Endoscopy;;    Allergies  Allergen Reactions  . Simvastatin     REACTION: muscle cramps    Current Outpatient Prescriptions  Medication Sig Dispense Refill  . benazepril-hydrochlorthiazide (LOTENSIN HCT) 20-12.5 MG per tablet Take 2 tabs by mouth in the mornings  180 tablet  0  . Blood Glucose Monitoring Suppl (TRUETRACK BLOOD GLUCOSE) W/DEVICE KIT by Does not apply route. Two times a day       . Blood Glucose Monitoring Suppl (TRUETRACK SMART SYSTEM) KIT by Does not apply route 2 (two) times daily. Two times a day testing       . Calcium Carbonate-Vitamin D (CALCIUM PLUS VITAMIN D PO) Take by mouth 2 (two) times daily. Take two tablets daily     . glucose blood (TRUETRACK TEST) test strip 1 each by Other route 2 (two) times daily. Two times a day     .  glyBURIDE-metformin (GLUCOVANCE) 5-500 MG per tablet Take two tablets by mouth twice a day    . insulin aspart protamine-insulin aspart (NOVOLOG MIX 70/30 FLEXPEN) (70-30) 100 UNIT/ML injection Inject 30 Units into the skin 2 (two) times daily with a meal.    . Insulin Pen Needle 29G X 12.7MM MISC Use as directed    . Insulin Syringe-Needle U-100 (B-D INSULIN SYRINGE) 31G X 5/16" 0.3 ML MISC by Does not apply route. Two times a day     . Multiple Vitamin (MULTIVITAMIN) capsule Take 1 capsule by mouth daily.      . pravastatin (PRAVACHOL) 40 MG tablet Take 1 tablet (40 mg total) by mouth daily.    . propranolol (INDERAL) 40 MG tablet Take 40 mg by mouth 2 (two) times daily.      .        Review of Systems     Objective:   Physical Exam  Vitals reviewed. Constitutional: She is oriented to person, place, and time. She appears well-developed and well-nourished.  HENT:  Head: Normocephalic and atraumatic.  Cardiovascular: Normal rate, regular rhythm and normal heart sounds.   Pulmonary/Chest: Effort normal and breath sounds normal.  Abdominal: Soft. Bowel sounds are normal. She exhibits no distension. There is no tenderness.  Neurological: She is alert and oriented to person,  place, and time.  Psychiatric: She has a normal mood and affect.          Assessment & Plan:

## 2011-01-08 NOTE — Progress Notes (Signed)
Reminder in epic to follow up in one year °

## 2011-01-08 NOTE — Progress Notes (Signed)
Cc to PCP 

## 2011-01-08 NOTE — Patient Instructions (Signed)
TAKE PRILOSEC EVERY MORNING FOREVER. FOLLOW UP IN 1 YEAR. CALL IF YOU DEVELOP PROBLEMS SWALLOWING.

## 2011-01-08 NOTE — Assessment & Plan Note (Signed)
CONTROLLED. EGD/DIL AUG 2012 DUE TO PEPTIC STRICTURE.  PRILOSEC FOREVER.

## 2011-01-08 NOTE — Assessment & Plan Note (Signed)
RESOLVED.  EGD/DIL IN FUTURE PRN. OPV IN 12 MOS. PRILOSEC ONCE DAILY FOREVER.

## 2011-03-24 LAB — BASIC METABOLIC PANEL
CO2: 26 mEq/L (ref 19–32)
Chloride: 99 mEq/L (ref 96–112)
Potassium: 4.4 mEq/L (ref 3.5–5.3)
Sodium: 137 mEq/L (ref 135–145)

## 2011-03-24 LAB — HEMOGLOBIN A1C: Mean Plasma Glucose: 177 mg/dL — ABNORMAL HIGH (ref <117)

## 2011-03-27 ENCOUNTER — Encounter: Payer: Self-pay | Admitting: Family Medicine

## 2011-03-30 ENCOUNTER — Ambulatory Visit (INDEPENDENT_AMBULATORY_CARE_PROVIDER_SITE_OTHER): Payer: Medicare Other | Admitting: Family Medicine

## 2011-03-30 ENCOUNTER — Encounter: Payer: Self-pay | Admitting: Family Medicine

## 2011-03-30 VITALS — BP 110/80 | HR 104 | Resp 16 | Ht 68.0 in | Wt 216.0 lb

## 2011-03-30 DIAGNOSIS — E109 Type 1 diabetes mellitus without complications: Secondary | ICD-10-CM

## 2011-03-30 DIAGNOSIS — F418 Other specified anxiety disorders: Secondary | ICD-10-CM | POA: Insufficient documentation

## 2011-03-30 DIAGNOSIS — I10 Essential (primary) hypertension: Secondary | ICD-10-CM

## 2011-03-30 DIAGNOSIS — G4700 Insomnia, unspecified: Secondary | ICD-10-CM

## 2011-03-30 DIAGNOSIS — E785 Hyperlipidemia, unspecified: Secondary | ICD-10-CM

## 2011-03-30 DIAGNOSIS — F341 Dysthymic disorder: Secondary | ICD-10-CM

## 2011-03-30 MED ORDER — PRAVASTATIN SODIUM 40 MG PO TABS: 40.0000 mg | ORAL_TABLET | Freq: Every day | ORAL | Status: DC

## 2011-03-30 MED ORDER — GLYBURIDE-METFORMIN 5-500 MG PO TABS: ORAL_TABLET | ORAL | Status: DC

## 2011-03-30 MED ORDER — TEMAZEPAM 15 MG PO CAPS: 15.0000 mg | ORAL_CAPSULE | Freq: Every evening | ORAL | Status: AC | PRN

## 2011-03-30 MED ORDER — BENAZEPRIL-HYDROCHLOROTHIAZIDE 20-12.5 MG PO TABS: ORAL_TABLET | ORAL | Status: DC

## 2011-03-30 NOTE — Assessment & Plan Note (Signed)
Controlled, no change in medication  

## 2011-03-30 NOTE — Patient Instructions (Signed)
F/u in 2 months.  You are referred to a therapist to help with depression and anxiety, pls go.  Please consider and join the senior citizens group so that you have less time at home on your own ,so that you worry less.  New medication is sent in for sleep

## 2011-04-11 DIAGNOSIS — G4700 Insomnia, unspecified: Secondary | ICD-10-CM | POA: Insufficient documentation

## 2011-04-11 NOTE — Assessment & Plan Note (Signed)
Sleep hygiene discussed, pt to start medication

## 2011-04-11 NOTE — Assessment & Plan Note (Signed)
Uncontrolled, pt has been very stressed recently and has not been as compliant with diet as she needs to be. Also since she is retired, physical activity is less

## 2011-04-11 NOTE — Assessment & Plan Note (Signed)
Hyperlipidemia:Low fat diet discussed and encouraged.  Controlled, no change in medication   

## 2011-04-11 NOTE — Progress Notes (Signed)
  Subjective:    Patient ID: Stacey Hanson, female    DOB: 08-Dec-1945, 66 y.o.   MRN: [id number]  HPI The PT is here for follow up and re-evaluation of chronic medical conditions, medication management and review of any available recent lab and radiology data.  Preventive health is updated, specifically  Cancer screening and Immunization.   Questions or concerns regarding consultations or procedures which the PT has had in the interim are  addressed. The PT denies any adverse reactions to current medications since the last visit.  C/o increased stress, depressed and fearful, several deaths in her family in recent times. She is anxious, sleeping poorly, and not involved in any regular exercise program, her diet and blood sugars are also uncontrolled. Retired, she is more house confined, and is using her time ruminating over her losses Tests on avg twice daily, fasting sugars are seldom under 140. Denies polyuria, [polydipsia, blurred vision or hypoglycemic episodew    Review of Systems See HPI Denies recent fever or chills. Denies sinus pressure, nasal congestion, ear pain or sore throat. Denies chest congestion, productive cough or wheezing. Denies chest pains, palpitations and leg swelling Denies abdominal pain, nausea, vomiting,diarrhea or constipation.   Denies dysuria, frequency, hesitancy or incontinence.  Denies headaches, seizures, numbness, or tingling.  Denies skin break down or rash.        Objective:   Physical Exam  Patient alert and oriented and in no cardiopulmonary distress.  HEENT: No facial asymmetry, EOMI, no sinus tenderness,  oropharynx pink and moist.  Neck supple no adenopathy.  Chest: Clear to auscultation bilaterally.  CVS: S1, S2 no murmurs, no S3.  ABD: Soft non tender. Bowel sounds normal.  Ext: No edema  MS: Adequate though reduced  ROM spine, shoulders, hips and knees.  Skin: Intact, no ulcerations or rash noted.  Psych: Good eye contact,  normal affect. Memory intact tearful, anxious and depressed appearing. Pt is not suicidal or homicidal, she denies hallucinations  CNS: CN 2-12 intact, power, tone and sensation normal throughout.       Assessment & Plan:

## 2011-04-11 NOTE — Assessment & Plan Note (Signed)
New diagnosis, recently retired, with multiple losses in her family, poor sleep, anxious ,tearful, fearful and overwhelmed. Refer to therapy, sleep hygiene discussed and med prescribed for sleep

## 2011-04-14 ENCOUNTER — Ambulatory Visit (INDEPENDENT_AMBULATORY_CARE_PROVIDER_SITE_OTHER): Payer: Medicare Other | Admitting: Psychiatry

## 2011-04-14 ENCOUNTER — Encounter (HOSPITAL_COMMUNITY): Payer: Self-pay | Admitting: Psychiatry

## 2011-04-14 DIAGNOSIS — F329 Major depressive disorder, single episode, unspecified: Secondary | ICD-10-CM

## 2011-04-14 DIAGNOSIS — F411 Generalized anxiety disorder: Secondary | ICD-10-CM

## 2011-04-14 DIAGNOSIS — F3289 Other specified depressive episodes: Secondary | ICD-10-CM

## 2011-04-14 DIAGNOSIS — F419 Anxiety disorder, unspecified: Secondary | ICD-10-CM

## 2011-04-14 NOTE — Patient Instructions (Signed)
Discussed orally 

## 2011-04-15 NOTE — Progress Notes (Signed)
Patient:   Stacey Hanson   DOB:   1945-07-15  MR Number:  [id number]  Location:  [address], [place], [place] [address]  Date of Service:   04/14/2011  Start Time:   2 PM End Time:   2:55 PM  Provider/Observer:  Florencia Reasons, MSW, LCSW   Billing Code/Service:  [phone]  Chief Complaint:     Chief Complaint  Patient presents with  . Depression  . Anxiety  . Other    sleep difficulty    Reason for Service:  The patient was referred for services by primary care physician Dr. Syliva Overman due to patient experiencing depression, anxiety, and sleep difficulty. Patient reports that the main stressors in her life are the deaths of several family members and friends within the past 2-3 years. She reports worry about who will be the next person to die. Patient also reports worry about various life issues such as her financial obligations. 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   Current Status:  The patient reports sleep difficulty, anxiety, depressed mood, crying spells, irritability, excessive eating, and excessive worrying.  Reliability of Information: Reliable  Behavioral Observation: Mikeyla Music  presents as a 66 y.o.-year-old  African American Female who appeared younger than  her stated age. Her dress was Appropriate and she was Casual and her manners were Appropriate to the situation.  There were not any physical disabilities noted.  She displayed an appropriate level of cooperation and motivation.    Interactions:    Active, poor eye contact  Attention:   within normal limits  Memory:   within normal limits  Visuo-spatial:   within normal limits  Speech (Volume):  normal  Speech:   normal pitch and normal volume  Thought Process:  Coherent  Though Content:  WNL  Orientation:   person, place, time/date, situation, day of week, month of year and  year  Judgment:   Good  Planning:   Good  Affect:    Anxious and Depressed, tearful  Mood:    Anxious and Depressed  Insight:   Good  Intelligence:   normal  Marital Status/Living: The patient was born and [place] [facility] and was reared in [place]. She is the oldest of 6 siblings. She reports mainly been reared by her grandmother. Patient reports having a good childhood. The patient is single. She has 3 sons ages 18, 32, and 62. The patient resides alone in [place].  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  Substance Use:  No concerns of substance abuse are reported.    Education:   Patient completed the 10th grade  Medical History:   Past Medical History  Diagnosis Date  . 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 Knee pain   . Obesity   . Hyperlipidemia   . Hypertension   . IDDM (insulin dependent diabetes mellitus) 1987  . Diabetes mellitus   . BMI 30.0-30.9,adult 2012 215 LBS    2004 198 LBS    Sexual History:   History  Sexual Activity  . Sexually Active: Not on file    Abuse/Trauma History: Denies  Psychiatric History:  Patient denies any psychiatric hospitalizations and reports no involvement in outpatient therapy.  Family Med/Psych History: Patient reports nonpsychiatric family history Family History  Problem Relation  Age of Onset  . Diabetes Mother   . Hypertension Mother   . Heart failure Mother   . Cancer Father     prostate and colon  . Diabetes Sister   . Hypertension Sister   . Diabetes Brother   . Hypertension Brother   . Coronary artery disease Brother   . Colon cancer Neg Hx   . Colon polyps Neg Hx     Risk of Suicide/Violence: virtually non-existent . Patient denies past and current suicidal and homicidal ideations.  Impression/DX:  Patient reports experiencing symptoms of depression for the past 2-3 years due to the deaths of several  family members and friends and fears who will be next to die. Patient also presents with symptoms of anxiety and reports being nervous, being easily irritated, and reports worrying about various issues. 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 Patient also is spacing adjustment and transition issues as she is a recent retiree. Diagnoses depressive disorder NOS, anxiety disorder NOS.  Disposition/Plan:  The patient attends the assessment appointment. Confidentiality and limits are discussed. The patient agrees to return for an appointment in 2 weeks for continuing assessment and treatment planning. She agrees to call this practice, 911, or have someone take her to the emergency room should symptoms worsen.  Diagnosis:    Axis I:   1. Depressive disorder, not elsewhere classified   2. Anxiety disorder         Axis II: No diagnosis       Axis III:  See medical history      Axis IV:  problems with primary support group          Axis V:  51-60 moderate symptoms

## 2011-04-28 ENCOUNTER — Ambulatory Visit (HOSPITAL_COMMUNITY): Payer: Medicare Other | Admitting: Psychiatry

## 2011-05-01 ENCOUNTER — Encounter (HOSPITAL_COMMUNITY): Payer: Self-pay | Admitting: Psychiatry

## 2011-05-01 ENCOUNTER — Ambulatory Visit (INDEPENDENT_AMBULATORY_CARE_PROVIDER_SITE_OTHER): Payer: Medicare Other | Admitting: Psychiatry

## 2011-05-01 DIAGNOSIS — F419 Anxiety disorder, unspecified: Secondary | ICD-10-CM

## 2011-05-01 DIAGNOSIS — F329 Major depressive disorder, single episode, unspecified: Secondary | ICD-10-CM

## 2011-05-01 DIAGNOSIS — F411 Generalized anxiety disorder: Secondary | ICD-10-CM

## 2011-05-01 NOTE — Progress Notes (Signed)
Patient:  Stacey Hanson   DOB: [birth year]  MR Number: [id number]  [facility]:  [address] [place] [place],  [place], [address]  Start: 05/01/2011 11:30 AM End: 05/01/2011 12:00 PM  Provider/Observer:     Florencia Reasons, MSW, LCSW   Chief Complaint:      Chief Complaint  Patient presents with  . Depression    Reason For Service:     The patient initially was referred for services by primary care physician Dr. Syliva Overman due to patient experiencing depression, anxiety, and sleep difficulty. Stressors in patient's life included the death of several family members and friends within the past 2 years. 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  Patient is here today for a follow-up appointment,  Interventions Strategy:  Supportive therapy  Participation Level:   Active  Participation Quality:  Appropriate      Behavioral Observation:  Casual, Alert, and Appropriate.   Current Psychosocial Factors: Patient reports no new stressors.  Content of Session:   Reviewing symptoms, exploring coping and relaxation techniques, and termination  Current Status:   The patient reports significant improvement in mood, absence of anxiety, absence of worry, absence of crying spells, and improved sleep pattern since last session.  Patient Progress:   Good.  The patient reports doing very well since her last appointment. She states feeling much better since having the opportunity to vent. She has had increased involvement with family and friends through face-to-face contact as well as phone contact. Patient also plans to join a senior citizens group and attend the senior center. Patient attends church regularly and reports being an active member of the [employer]. Patient is very pleased with her progress and says she has been managing well. Therapist and patient agree to terminate services at  this time as patient has not experienced symptoms for the past 2 weeks and feels she no longer needs services. Patient was encouraged to call this practice should she need services in the future.   Impression/Diagnosis:   The patient initially presented with symptoms of depression and anxiety. Her stressors included the death of several family members and friends and fearing who would be the next to die. She experienced depressed mood, crying spells, excessive worry, and irritability. Patient reports increased acceptance of death and states no longer worrying. Patient also is experiencing adjustment and transition issues as she is a recent retiree. However, patient is move forward and making plans to become more involved in activities.  Diagnosis:  Axis I:  1. Depressive disorder, not elsewhere classified   2. Anxiety disorder             Axis II: No diagnosis

## 2011-05-01 NOTE — Patient Instructions (Signed)
Discussed orally 

## 2011-05-13 ENCOUNTER — Other Ambulatory Visit: Payer: Self-pay | Admitting: Family Medicine

## 2011-05-13 DIAGNOSIS — Z139 Encounter for screening, unspecified: Secondary | ICD-10-CM

## 2011-06-01 ENCOUNTER — Encounter: Payer: Self-pay | Admitting: Family Medicine

## 2011-06-01 ENCOUNTER — Ambulatory Visit (INDEPENDENT_AMBULATORY_CARE_PROVIDER_SITE_OTHER): Payer: Medicare Other | Admitting: Family Medicine

## 2011-06-01 VITALS — BP 130/70 | HR 106 | Resp 18 | Ht 68.0 in | Wt 220.0 lb

## 2011-06-01 DIAGNOSIS — F341 Dysthymic disorder: Secondary | ICD-10-CM

## 2011-06-01 DIAGNOSIS — I10 Essential (primary) hypertension: Secondary | ICD-10-CM

## 2011-06-01 DIAGNOSIS — E109 Type 1 diabetes mellitus without complications: Secondary | ICD-10-CM

## 2011-06-01 DIAGNOSIS — F418 Other specified anxiety disorders: Secondary | ICD-10-CM

## 2011-06-01 DIAGNOSIS — E785 Hyperlipidemia, unspecified: Secondary | ICD-10-CM

## 2011-06-01 DIAGNOSIS — IMO0002 Reserved for concepts with insufficient information to code with codable children: Secondary | ICD-10-CM

## 2011-06-01 MED ORDER — METFORMIN HCL 1000 MG PO TABS: 1000.0000 mg | ORAL_TABLET | Freq: Two times a day (BID) | ORAL | Status: DC

## 2011-06-01 NOTE — Patient Instructions (Signed)
F/u early June  Fasting lipid , cmp and EGFr,  May 30 or after.  Please test before breakfast and bedtime and follow the reference ranges in the diary provided. Call with any problems with the blood sugar.  It is important that you exercise regularly at least 30 minutes 5 times a week. If you develop chest pain, have severe difficulty breathing, or feel very tired, stop exercising immediately and seek medical attention . This will help your blood sugar  When you finish the gllyburide/metformin , you will be on metformin only, along with the insulin. Increase novolog to 30 units twice daily

## 2011-06-01 NOTE — Assessment & Plan Note (Signed)
Uncontrolled, reviewed testing schedule and reference ranges book provided, pt to call if numbers remain high. She is to start walking

## 2011-06-01 NOTE — Progress Notes (Signed)
  Subjective:    Patient ID: Stacey Hanson, female    DOB: October 05, 1945, 66 y.o.   MRN: [id number]  HPI The PT is here for follow up and re-evaluation of chronic medical conditions, medication management and review of any available recent lab and radiology data. Most specifically about her diabetes. She does not have her meter or diary, tests morning and pre supper, number is generally about 190. Advised bedtime check instead of pre supper , reviewed the expected values and provided a diary. Still not exercising but states she will start Preventive health is updated, specifically  Cancer screening and Immunization.   Questions or concerns regarding consultations or procedures which the PT has had in the interim are  addressed. The PT denies any adverse reactions to current medications since the last visit.  There are no new concerns.  There are no specific complaints       Review of Systems See HPI Denies recent fever or chills. Denies sinus pressure, nasal congestion, ear pain or sore throat. Denies chest congestion, productive cough or wheezing. Denies chest pains, palpitations and leg swelling Denies abdominal pain, nausea, vomiting,diarrhea or constipation.   Denies dysuria, frequency, hesitancy or incontinence. Chronic back pain with limitation in movement Denies headaches, seizures, numbness, or tingling. Denies uncontrolled  depression, anxiety or insomnia. Denies skin break down or rash.        Objective:   Physical Exam  Patient alert and oriented and in no cardiopulmonary distress.  HEENT: No facial asymmetry, EOMI, no sinus tenderness,  oropharynx pink and moist.  Neck supple no adenopathy.  Chest: Clear to auscultation bilaterally.  CVS: S1, S2 no murmurs, no S3.  ABD: Soft non tender. Bowel sounds normal.  Ext: No edema  MS: Adequate though reduced  ROM spine, shoulders, hips and knees.  Skin: Intact, no ulcerations or rash noted.  Psych: Good eye  contact, normal affect. Memory intact not anxious or depressed appearing.  CNS: CN 2-12 intact, power, normal throughout.Decreased sensation in feet       Assessment & Plan:

## 2011-06-01 NOTE — Assessment & Plan Note (Signed)
Persistent pain despite injection. 

## 2011-06-01 NOTE — Assessment & Plan Note (Signed)
Resolved, psychotherapy reportedly was very beneficial

## 2011-06-01 NOTE — Assessment & Plan Note (Signed)
Hyperlipidemia:Low fat diet discussed and encouraged.  Updated labs before next visit 

## 2011-06-01 NOTE — Assessment & Plan Note (Signed)
Controlled, no change in medication  

## 2011-06-03 ENCOUNTER — Other Ambulatory Visit: Payer: Self-pay

## 2011-06-03 MED ORDER — BENAZEPRIL-HYDROCHLOROTHIAZIDE 20-12.5 MG PO TABS: ORAL_TABLET | ORAL | Status: DC

## 2011-06-03 MED ORDER — PRAVASTATIN SODIUM 40 MG PO TABS: 40.0000 mg | ORAL_TABLET | Freq: Every day | ORAL | Status: DC

## 2011-06-18 ENCOUNTER — Ambulatory Visit (HOSPITAL_COMMUNITY)
Admission: RE | Admit: 2011-06-18 | Discharge: 2011-06-18 | Disposition: A | Discharge status: Home / Self Care | Payer: Medicare Other | Source: Ambulatory Visit | Attending: Family Medicine | Admitting: Family Medicine

## 2011-06-18 DIAGNOSIS — Z139 Encounter for screening, unspecified: Secondary | ICD-10-CM

## 2011-06-18 DIAGNOSIS — Z1231 Encounter for screening mammogram for malignant neoplasm of breast: Secondary | ICD-10-CM | POA: Insufficient documentation

## 2011-07-31 LAB — COMPLETE METABOLIC PANEL WITH GFR
Albumin: 4.6 g/dL (ref 3.5–5.2)
Alkaline Phosphatase: 113 U/L (ref 39–117)
BUN: 21 mg/dL (ref 6–23)
Creat: 0.97 mg/dL (ref 0.50–1.10)
GFR, Est Non African American: 61 mL/min
Glucose, Bld: 284 mg/dL — ABNORMAL HIGH (ref 70–99)
Potassium: 4.6 mEq/L (ref 3.5–5.3)
Total Bilirubin: 0.3 mg/dL (ref 0.3–1.2)

## 2011-07-31 LAB — LIPID PANEL
Cholesterol: 187 mg/dL (ref 0–200)
HDL: 58 mg/dL (ref >39)
Total CHOL/HDL Ratio: 3.2 Ratio
Triglycerides: 113 mg/dL (ref <150)

## 2011-08-04 ENCOUNTER — Encounter: Payer: Self-pay | Admitting: Family Medicine

## 2011-08-04 ENCOUNTER — Ambulatory Visit (INDEPENDENT_AMBULATORY_CARE_PROVIDER_SITE_OTHER): Payer: Medicare Other | Admitting: Family Medicine

## 2011-08-04 VITALS — BP 120/80 | HR 104 | Resp 16 | Ht 68.0 in | Wt 217.4 lb

## 2011-08-04 DIAGNOSIS — I10 Essential (primary) hypertension: Secondary | ICD-10-CM

## 2011-08-04 DIAGNOSIS — E785 Hyperlipidemia, unspecified: Secondary | ICD-10-CM

## 2011-08-04 DIAGNOSIS — E0590 Thyrotoxicosis, unspecified without thyrotoxic crisis or storm: Secondary | ICD-10-CM

## 2011-08-04 DIAGNOSIS — E109 Type 1 diabetes mellitus without complications: Secondary | ICD-10-CM

## 2011-08-04 MED ORDER — BENAZEPRIL-HYDROCHLOROTHIAZIDE 20-12.5 MG PO TABS: ORAL_TABLET | ORAL | Status: DC

## 2011-08-04 MED ORDER — PRAVASTATIN SODIUM 40 MG PO TABS: 40.0000 mg | ORAL_TABLET | Freq: Every day | ORAL | Status: DC

## 2011-08-04 NOTE — Progress Notes (Signed)
  Subjective:    Patient ID: Zenna Traister, female    DOB: [birth year], 66 y.o.   MRN: [id number]  HPI The PT is here for follow up and re-evaluation of chronic medical conditions, medication management and review of any available recent lab and radiology data.  Preventive health is updated, specifically  Cancer screening and Immunization.   Questions or concerns regarding consultations or procedures which the PT has had in the interim are  addressed. The PT denies any adverse reactions to current medications since the last visit.  C/o increased and uncontrolled blood sugars in the past 3 to 4 weeks even worse than before. Diet still sub optimal and pt resisting nutrition consult, has been in the past but carb counting not a reality    Review of Systems See HPI Denies recent fever or chills. Denies sinus pressure, nasal congestion, ear pain or sore throat. Denies chest congestion, productive cough or wheezing. Denies chest pains, palpitations and leg swelling Denies abdominal pain, nausea, vomiting,diarrhea or constipation.   Denies dysuria, frequency, hesitancy or incontinence. Chronic back pain Denies headaches, seizures, numbness, or tingling. Denies depression, anxiety or insomnia. Denies skin break down or rash.        Objective:   Physical Exam  Patient alert and oriented and in no cardiopulmonary distress.  HEENT: No facial asymmetry, EOMI, no sinus tenderness,  oropharynx pink and moist.  Neck supple no adenopathy.  Chest: Clear to auscultation bilaterally.  CVS: S1, S2 no murmurs, no S3.  ABD: Soft non tender. Bowel sounds normal.  Ext: No edema  MS: Adequate though reduced  ROM spine, shoulders, hips and knees.  Skin: Intact, no ulcerations or rash noted.  Psych: Good eye contact, normal affect. Memory intact not anxious or depressed appearing.  CNS: CN 2-12 intact, power, tone and sensation normal throughout.       Assessment & Plan:

## 2011-08-04 NOTE — Patient Instructions (Addendum)
F/u in 6 weeks  HBA1C non fasting in 6 weeks, just  Before visit  Increase insulin to 33 units twice daily and change eating as discussed.  Continue to enjoy exercise and increase to 5 days every week  Goal for fasting blood sugar ranges from 80 to 120 and 2 hours after any meal or at bedtime should be between 130 to 170.  Please call if blood sugars still high in the next 2 weeks.  Bad cholesterol is slightly high, please cut back on cheese, red meat and crackers.  Salads with less dressing , and fat free are good  Blood pressure is excellent

## 2011-08-07 ENCOUNTER — Ambulatory Visit: Payer: Medicare Other | Admitting: Family Medicine

## 2011-08-09 NOTE — Assessment & Plan Note (Signed)
Managed adequately by endo

## 2011-08-09 NOTE — Assessment & Plan Note (Signed)
uncontrolled pt needs to switch to two types of insulin but continues to resist this, she is to call back if no improvement in her blood sugars, she would benefit from endo management but is hesitant to do so

## 2011-08-09 NOTE — Assessment & Plan Note (Signed)
Uncontrolled dietary change only, no med change

## 2011-08-09 NOTE — Assessment & Plan Note (Signed)
Controlled, no change in medication  

## 2011-09-09 LAB — HEMOGLOBIN A1C
Hgb A1c MFr Bld: 9.6 % — ABNORMAL HIGH (ref <5.7)
Mean Plasma Glucose: 229 mg/dL — ABNORMAL HIGH (ref <117)

## 2011-09-15 ENCOUNTER — Encounter: Payer: Self-pay | Admitting: Family Medicine

## 2011-09-15 ENCOUNTER — Ambulatory Visit: Payer: Medicare Other | Admitting: Family Medicine

## 2011-09-15 ENCOUNTER — Ambulatory Visit (INDEPENDENT_AMBULATORY_CARE_PROVIDER_SITE_OTHER): Payer: Medicare Other | Admitting: Family Medicine

## 2011-09-15 VITALS — BP 120/80 | HR 102 | Resp 16 | Ht 68.0 in | Wt 215.4 lb

## 2011-09-15 DIAGNOSIS — I10 Essential (primary) hypertension: Secondary | ICD-10-CM

## 2011-09-15 DIAGNOSIS — E109 Type 1 diabetes mellitus without complications: Secondary | ICD-10-CM

## 2011-09-15 DIAGNOSIS — E785 Hyperlipidemia, unspecified: Secondary | ICD-10-CM

## 2011-09-15 NOTE — Assessment & Plan Note (Signed)
Elevated LDL, low fat diet discussed and encouraged, no med change

## 2011-09-15 NOTE — Patient Instructions (Addendum)
F/u in 3 weeks.bring meter and diary/calender  You need to give away all unhealthy snacks , eg chips, candy, cookies , cake and ice cream Increase insulin to 37 units twice daily, continue metformin    Goal for fasting blood sugar ranges from 80 to 120 and 2 hours after any meal or at bedtime should be between 130 to 170.  Call with any questions.  Please go to diabetic group class as soon as possible

## 2011-09-15 NOTE — Assessment & Plan Note (Signed)
Uncontrolled and deteriorated. Pt non compliant with diet, will also inc dose of insulin

## 2011-09-15 NOTE — Assessment & Plan Note (Signed)
Controlled, no change in medication  

## 2011-09-15 NOTE — Progress Notes (Signed)
  Subjective:    Patient ID: Stacey Hanson, female    DOB: [birth year], 66 y.o.   MRN: [id number]  HPI The PT is here for follow up and re-evaluation of chronic medical conditions, medication management and review of any available recent lab and radiology data.  Preventive health is updated, specifically  Cancer screening and Immunization.    The PT denies any adverse reactions to current medications since the last visit.  There are no new concerns.  There are no specific complaints   Pt's blood sugar has deteriorated significantly. Unfortunately, she has been snacking on junk food and sweets,. Sees the need to change this now    Review of Systems See HPI Denies recent fever or chills. Denies sinus pressure, nasal congestion, ear pain or sore throat. Denies chest congestion, productive cough or wheezing. Denies chest pains, palpitations and leg swelling Denies abdominal pain, nausea, vomiting,diarrhea or constipation.   Denies dysuria, frequency, hesitancy or incontinence. Denies disabling  joint pain, swelling and limitation in mobility. Denies headaches, seizures, numbness, or tingling. Denies depression, anxiety or insomnia. Denies skin break down or rash.        Objective:   Physical Exam  Patient alert and oriented and in no cardiopulmonary distress.  HEENT: No facial asymmetry, EOMI, no sinus tenderness,  oropharynx pink and moist.  Neck supple no adenopathy.  Chest: Clear to auscultation bilaterally.  CVS: S1, S2 no murmurs, no S3.  ABD: Soft non tender. Bowel sounds normal.  Ext: No edema  MS: Adequate ROM spine, shoulders, hips and knees.  Skin: Intact, no ulcerations or rash noted.  Psych: Good eye contact, normal affect. Memory intact not anxious or depressed appearing.  CNS: CN 2-12 intact, power, tone and sensation normal throughout.       Assessment & Plan:

## 2011-10-07 ENCOUNTER — Encounter: Payer: Self-pay | Admitting: Family Medicine

## 2011-10-07 ENCOUNTER — Ambulatory Visit (INDEPENDENT_AMBULATORY_CARE_PROVIDER_SITE_OTHER): Payer: Medicare Other | Admitting: Family Medicine

## 2011-10-07 VITALS — BP 142/80 | HR 104 | Resp 18 | Ht 68.0 in | Wt 214.0 lb

## 2011-10-07 DIAGNOSIS — E785 Hyperlipidemia, unspecified: Secondary | ICD-10-CM

## 2011-10-07 DIAGNOSIS — E109 Type 1 diabetes mellitus without complications: Secondary | ICD-10-CM

## 2011-10-07 DIAGNOSIS — I10 Essential (primary) hypertension: Secondary | ICD-10-CM

## 2011-10-07 DIAGNOSIS — E669 Obesity, unspecified: Secondary | ICD-10-CM

## 2011-10-07 DIAGNOSIS — Z23 Encounter for immunization: Secondary | ICD-10-CM

## 2011-10-07 LAB — GLUCOSE, POCT (MANUAL RESULT ENTRY): POC Glucose: 190 mg/dl — AB (ref 70–99)

## 2011-10-07 MED ORDER — INSULIN ASPART PROT & ASPART (70-30 MIX) 100 UNIT/ML ~~LOC~~ SUSP: 40.0000 [IU] | Freq: Two times a day (BID) | SUBCUTANEOUS | Status: DC

## 2011-10-07 NOTE — Patient Instructions (Addendum)
F/u in November  Fasting lipid, cmp and EGFR, HBA1C, in Novemebr   Blood sugars look better, continue 37 units twice daily  We will try to get some insulin for you  It is important that you exercise regularly at least 30 minutes 5 times a week. If you develop chest pain, have severe difficulty breathing, or feel very tired, stop exercising immediately and seek medical attention   A healthy diet is rich in fruit, vegetables and whole grains. Poultry fish, nuts and beans are a healthy choice for protein rather then red meat. A low sodium diet and drinking 64 ounces of water daily is generally recommended. Oils and sweet should be limited. Carbohydrates especially for those who are diabetic or overweight, should be limited to 30-45 gram per meal. It is important to eat on a regular schedule, at least 3 times daily. Snacks should be primarily fruits, vegetables or nuts.

## 2011-10-11 ENCOUNTER — Telehealth: Payer: Self-pay | Admitting: Family Medicine

## 2011-10-11 NOTE — Assessment & Plan Note (Signed)
Hyperlipidemia:Low fat diet discussed and encouraged.  LDL elevated, no med change 

## 2011-10-11 NOTE — Assessment & Plan Note (Signed)
Unchanged. Patient re-educated about  the importance of commitment to a  minimum of 150 minutes of exercise per week. The importance of healthy food choices with portion control discussed. Encouraged to start a food diary, count calories and to consider  joining a support group. Sample diet sheets offered. Goals set by the patient for the next several months.    

## 2011-10-11 NOTE — Assessment & Plan Note (Signed)
Controlled, no change in medication DASH diet and commitment to daily physical activity for a minimum of 30 minutes discussed and encouraged, as a part of hypertension management. The importance of attaining a healthy weight is also discussed.  

## 2011-10-11 NOTE — Progress Notes (Signed)
  Subjective:    Patient ID: Stacey Hanson, female    DOB: 02/26/1946, 66 y.o.   MRN: [id number]  HPI The PT is here for follow up and re-evaluation of chronic medical conditions,in particular her diabetes, which is uncontrolled, medication management and review of any available recent lab and radiology data.  Preventive health is updated, specifically  Cancer screening and Immunization.   Questions or concerns regarding consultations or procedures which the PT has had in the interim are  Addressed.Recently saw endo for thyroid disease, propanolol added to control heart rate The PT denies any adverse reactions to current medications since the last visit.  There are no new concerns.  There are no specific complaints except for increased med cost      Review of Systems See HPI Denies recent fever or chills. Denies sinus pressure, nasal congestion, ear pain or sore throat. Denies chest congestion, productive cough or wheezing. Denies chest pains, palpitations and leg swelling Denies abdominal pain, nausea, vomiting,diarrhea or constipation.   Denies dysuria, frequency, hesitancy or incontinence. Denies joint pain, swelling and limitation in mobility. Denies headaches, seizures, numbness, or tingling. Denies depression, anxiety or insomnia. Denies skin break down or rash.        Objective:   Physical Exam Patient alert and oriented and in no cardiopulmonary distress.  HEENT: No facial asymmetry, EOMI, no sinus tenderness,  oropharynx pink and moist.  Neck supple no adenopathy.  Chest: Clear to auscultation bilaterally.  CVS: S1, S2 no murmurs, no S3.  ABD: Soft non tender. Bowel sounds normal.  Ext: No edema  MS: Adequate ROM spine, shoulders, hips and knees.  Skin: Intact, no ulcerations or rash noted.  Psych: Good eye contact, normal affect. Memory intact not anxious or depressed appearing.  CNS: CN 2-12 intact, power, tone and sensation normal throughout.       Assessment & Plan:

## 2011-10-11 NOTE — Telephone Encounter (Signed)
Pt is on novolog flex pen please call for samples for her due to recent price increase, we had offereed to do this, thankls

## 2011-10-11 NOTE — Assessment & Plan Note (Signed)
Diary shows improved control, wil lattempt to help with insulin which has increased in price

## 2011-10-28 NOTE — Telephone Encounter (Signed)
Has called for some samples. He will be here today

## 2011-11-12 ENCOUNTER — Ambulatory Visit (INDEPENDENT_AMBULATORY_CARE_PROVIDER_SITE_OTHER): Payer: Medicare Other | Admitting: Otolaryngology

## 2011-11-12 DIAGNOSIS — D449 Neoplasm of uncertain behavior of unspecified endocrine gland: Secondary | ICD-10-CM

## 2011-12-01 ENCOUNTER — Other Ambulatory Visit: Payer: Self-pay

## 2011-12-01 ENCOUNTER — Telehealth: Payer: Self-pay | Admitting: Family Medicine

## 2011-12-01 MED ORDER — BENAZEPRIL-HYDROCHLOROTHIAZIDE 20-12.5 MG PO TABS: ORAL_TABLET | ORAL | Status: DC

## 2011-12-01 MED ORDER — PRAVASTATIN SODIUM 40 MG PO TABS: 40.0000 mg | ORAL_TABLET | Freq: Every day | ORAL | Status: DC

## 2011-12-01 NOTE — Telephone Encounter (Signed)
meds sent

## 2011-12-03 ENCOUNTER — Telehealth: Payer: Self-pay | Admitting: Family Medicine

## 2011-12-03 DIAGNOSIS — E109 Type 1 diabetes mellitus without complications: Secondary | ICD-10-CM

## 2011-12-03 MED ORDER — METFORMIN HCL 1000 MG PO TABS: 1000.0000 mg | ORAL_TABLET | Freq: Two times a day (BID) | ORAL | Status: DC

## 2011-12-03 NOTE — Telephone Encounter (Signed)
Sent in

## 2011-12-10 ENCOUNTER — Encounter: Payer: Self-pay | Admitting: *Deleted

## 2011-12-24 ENCOUNTER — Ambulatory Visit (INDEPENDENT_AMBULATORY_CARE_PROVIDER_SITE_OTHER): Payer: Medicare Other | Admitting: Orthopedic Surgery

## 2011-12-24 ENCOUNTER — Ambulatory Visit (INDEPENDENT_AMBULATORY_CARE_PROVIDER_SITE_OTHER): Payer: Medicare Other

## 2011-12-24 ENCOUNTER — Encounter: Payer: Self-pay | Admitting: Orthopedic Surgery

## 2011-12-24 VITALS — BP 120/80 | Ht 68.0 in | Wt 214.0 lb

## 2011-12-24 DIAGNOSIS — M25569 Pain in unspecified knee: Secondary | ICD-10-CM

## 2011-12-24 DIAGNOSIS — M1710 Unilateral primary osteoarthritis, unspecified knee: Secondary | ICD-10-CM

## 2011-12-24 NOTE — Patient Instructions (Signed)
activities as tolerated 

## 2011-12-24 NOTE — Progress Notes (Signed)
Patient ID: Stacey Hanson, female   DOB: 09-09-1945, 66 y.o.   MRN: [id number] 1. Knee pain  DG Knee AP/LAT W/Sunrise Right  2. PRIMARY LOCALIZED OSTEOARTHROSIS LOWER LEG     Chief Complaint  Patient presents with  . Follow-up    recheck and xray right knee    The patient is not having any pain in her knee  She says she is doing well  BP 120/80  Ht 5\' 8"  (1.727 m)  Wt 214 lb (97.07 kg)  BMI 32.54 kg/m2   Review of systems negative for locking catching giving way or numbness or tingling  Exam shows her range of motion is 5-80 gait unsupported no tenderness to palpation strength normal ligament stable. Neurovascular exam normal  X-rays show progressive DJD  Valgus alignment  Osteoarthritis  Followup one year unless symptoms worsen

## 2011-12-28 LAB — LIPID PANEL
LDL Cholesterol: 106 mg/dL — ABNORMAL HIGH (ref 0–99)
Triglycerides: 75 mg/dL (ref <150)

## 2011-12-28 LAB — COMPLETE METABOLIC PANEL WITH GFR
ALT: 16 U/L (ref 0–35)
CO2: 27 mEq/L (ref 19–32)
Calcium: 9.7 mg/dL (ref 8.4–10.5)
Chloride: 99 mEq/L (ref 96–112)
Creat: 0.95 mg/dL (ref 0.50–1.10)
GFR, Est African American: 72 mL/min
Total Protein: 7.5 g/dL (ref 6.0–8.3)

## 2011-12-28 LAB — HEMOGLOBIN A1C: Mean Plasma Glucose: 171 mg/dL — ABNORMAL HIGH (ref <117)

## 2011-12-30 ENCOUNTER — Telehealth: Payer: Self-pay | Admitting: Gastroenterology

## 2011-12-30 NOTE — Telephone Encounter (Signed)
Pt due for FU OV Nov 2103

## 2012-01-02 ENCOUNTER — Other Ambulatory Visit: Payer: Self-pay | Admitting: Family Medicine

## 2012-01-06 ENCOUNTER — Telehealth: Payer: Self-pay | Admitting: Family Medicine

## 2012-01-06 ENCOUNTER — Ambulatory Visit (INDEPENDENT_AMBULATORY_CARE_PROVIDER_SITE_OTHER): Payer: Medicare Other | Admitting: Family Medicine

## 2012-01-06 VITALS — BP 122/80 | HR 89 | Resp 15 | Ht 68.0 in | Wt 211.4 lb

## 2012-01-06 DIAGNOSIS — R5381 Other malaise: Secondary | ICD-10-CM

## 2012-01-06 DIAGNOSIS — E109 Type 1 diabetes mellitus without complications: Secondary | ICD-10-CM

## 2012-01-06 DIAGNOSIS — E785 Hyperlipidemia, unspecified: Secondary | ICD-10-CM

## 2012-01-06 DIAGNOSIS — E669 Obesity, unspecified: Secondary | ICD-10-CM

## 2012-01-06 DIAGNOSIS — I10 Essential (primary) hypertension: Secondary | ICD-10-CM

## 2012-01-06 DIAGNOSIS — E0590 Thyrotoxicosis, unspecified without thyrotoxic crisis or storm: Secondary | ICD-10-CM

## 2012-01-06 DIAGNOSIS — R5383 Other fatigue: Secondary | ICD-10-CM

## 2012-01-06 DIAGNOSIS — Z139 Encounter for screening, unspecified: Secondary | ICD-10-CM

## 2012-01-06 NOTE — Progress Notes (Signed)
  Subjective:    Patient ID: Stacey Hanson, female    DOB: 1945-03-30, 66 y.o.   MRN: [id number]  HPI The PT is here for follow up and re-evaluation of chronic medical conditions, medication management and review of any available recent lab and radiology data.  Preventive health is updated, specifically  Cancer screening and Immunization.   Questions or concerns regarding consultations or procedures which the PT has had in the interim are  Addressed.Recently evaluated by endo, has annual f/u for thyroid, and also recently went to ortho re knee pain The PT denies any adverse reactions to current medications since the last visit. Her big problem is inability to afford the insulin in the donut hole so she is missing some doses of her meds Tests twice daily, fasting sugars are in the 150's and 160's      Review of Systems See HPI Denies recent fever or chills. Denies sinus pressure, nasal congestion, ear pain or sore throat. Denies chest congestion, productive cough or wheezing. Denies chest pains, palpitations and leg swelling Denies abdominal pain, nausea, vomiting,diarrhea or constipation.   Denies dysuria, frequency, hesitancy or incontinence. Denies uncontrolled joint pain, swelling and limitation in mobility. Denies headaches, seizures, numbness, or tingling. Denies depression, anxiety or insomnia. Denies skin break down or rash.        Objective:   Physical Exam  Patient alert and oriented and in no cardiopulmonary distress.  HEENT: No facial asymmetry, EOMI, no sinus tenderness,  oropharynx pink and moist.  Neck supple no adenopathy.  Chest: Clear to auscultation bilaterally.  CVS: S1, S2 no murmurs, no S3.  ABD: Soft non tender. Bowel sounds normal.  Ext: No edema  MS: Adequate ROM spine, shoulders, hips and knees.  Skin: Intact, no ulcerations or rash noted.  Psych: Good eye contact, normal affect. Memory intact not anxious or depressed appearing.  CNS: CN 2-12  intact, power, tone and sensation normal throughout. Diabetic Foot Check:  Appearance - no lesions, ulcers or calluses Skin - no unusual pallor or redness Sensation - grossly intact to light touch Pulses Left - Dorsalis Pedis and Posterior Tibia normal Right - Dorsalis Pedis and Posterior Tibia normal       Assessment & Plan:

## 2012-01-06 NOTE — Patient Instructions (Addendum)
Annual wellness in  74month, please call if you need me before   Blood sugar has improved, but still too high, likely because you run out of your insulin. We will try to help you with assistance for the insulin  It is important that you exercise regularly at least 30 minutes 5 times a week. If you develop chest pain, have severe difficulty breathing, or feel very tired, stop exercising immediately and seek medical attention   A healthy diet is rich in fruit, vegetables and whole grains. Poultry fish, nuts and beans are a healthy choice for protein rather then red meat. A low sodium diet and drinking 64 ounces of water daily is generally recommended. Oils and sweet should be limited. Carbohydrates especially for those who are diabetic or overweight, should be limited to 34-45 gram per meal. It is important to eat on a regular schedule, at least 3 times daily. Snacks should be primarily fruits, vegetables or nuts.   Blood pressure is excellent  Bad cholesterol is higher than should be, please cut back on fried and fatty foods and cheese  HBA1C and chem 7 non fasting in 4 month

## 2012-01-08 NOTE — Telephone Encounter (Signed)
Patient was just returning phone call.  No problem.

## 2012-01-09 ENCOUNTER — Encounter: Payer: Self-pay | Admitting: Family Medicine

## 2012-01-09 NOTE — Assessment & Plan Note (Signed)
Improved though uncontrolled, unable to afford insulin in donut hole will seek assistance

## 2012-01-09 NOTE — Assessment & Plan Note (Signed)
S/p ablation by rAI, followed by endo, controlled followed annualy

## 2012-01-09 NOTE — Assessment & Plan Note (Signed)
Hyperlipidemia:Low fat diet discussed and encouraged.  lDL elevated, dietary change only, no med change

## 2012-01-09 NOTE — Assessment & Plan Note (Signed)
Controlled, no change in medication DASH diet and commitment to daily physical activity for a minimum of 30 minutes discussed and encouraged, as a part of hypertension management. The importance of attaining a healthy weight is also discussed.  

## 2012-01-09 NOTE — Assessment & Plan Note (Signed)
Unchanged. Patient re-educated about  the importance of commitment to a  minimum of 150 minutes of exercise per week. The importance of healthy food choices with portion control discussed. Encouraged to start a food diary, count calories and to consider  joining a support group. Sample diet sheets offered. Goals set by the patient for the next several months.    

## 2012-01-11 NOTE — Telephone Encounter (Signed)
Pt is aware of OV on 11/13 at 9 with SF

## 2012-01-12 ENCOUNTER — Encounter: Payer: Self-pay | Admitting: Internal Medicine

## 2012-01-13 ENCOUNTER — Ambulatory Visit (INDEPENDENT_AMBULATORY_CARE_PROVIDER_SITE_OTHER): Payer: Medicare Other | Admitting: Gastroenterology

## 2012-01-13 ENCOUNTER — Encounter: Payer: Self-pay | Admitting: Gastroenterology

## 2012-01-13 VITALS — BP 132/74 | HR 98 | Temp 97.6°F | Ht 69.0 in | Wt 212.0 lb

## 2012-01-13 DIAGNOSIS — Z1211 Encounter for screening for malignant neoplasm of colon: Secondary | ICD-10-CM

## 2012-01-13 DIAGNOSIS — R1310 Dysphagia, unspecified: Secondary | ICD-10-CM

## 2012-01-13 DIAGNOSIS — K219 Gastro-esophageal reflux disease without esophagitis: Secondary | ICD-10-CM

## 2012-01-13 MED ORDER — OMEPRAZOLE 20 MG PO CPDR: DELAYED_RELEASE_CAPSULE | ORAL | Status: DC

## 2012-01-13 NOTE — Assessment & Plan Note (Signed)
RESOLVED.  OPV IN 1 YEAR.

## 2012-01-13 NOTE — Progress Notes (Signed)
  Subjective:    Patient ID: Stacey Hanson, female    DOB: [birth year], 66 y.o.   MRN: [id number]  PCP: Lodema Hong  HPI GERD real good when she takes OMEPRAZOLE. SWALLOWING IS GOOD. PT DENIES FEVER, CHILLS, BRBPR, nausea, vomiting, melena, diarrhea,  abd pain,  problems with sedation, heartburn or indigestion. OCCASIONAL constipation.  Past Medical History  Diagnosis Date  . 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 Knee pain   . Obesity   . Hyperlipidemia   . Hypertension   . IDDM (insulin dependent diabetes mellitus) 1987  . Diabetes mellitus   . BMI 30.0-30.9,adult 2012 215 LBS    2004 198 LBS    Past Surgical History  Procedure Date  . Abdominal hysterectomy 1970  . Cholecystectomy OCT 2010 BZ BILIARY DYSKINESIA    CHRONIC CHOLECYSTITIS  . Colonoscopy MAY 2009 SCREENING    pTC TICS, SML IH  . Total abdominal hysterectomy 1970  . Upper gastrointestinal endoscopy OCT 2004 RMR    DUO TIC, & ULCER  . Esophagogastroduodenoscopy 10/03/2010    Procedure: ESOPHAGOGASTRODUODENOSCOPY (EGD);  Surgeon: Arlyce Harman, MD;  Location: [facility];  Service: Endoscopy;  Laterality: N/A;  . Savory dilation 10/03/2010    Procedure: SAVORY DILATION;  Surgeon: Arlyce Harman, MD;  Location: [facility];  Service: Endoscopy;;  . Esophagogastroduodenoscopy 12/19/2002    RMR: Normal esophagus/Normal stomach/ Duodenum large bulbar diverticulum, 1 cm bulbar ulcer with surrounding/ inflammation as described above. Normal D2    Allergies  Allergen Reactions  . Simvastatin     REACTION: muscle cramps    Current Outpatient Prescriptions  Medication Sig Dispense Refill  .      Marland Kitchen benazepril-hydrochlorthiazide (LOTENSIN HCT) 20-12.5 MG per tablet Take 2 tabs by mouth in the mornings    .      .      . Calcium Carbonate-Vitamin D (CALCIUM PLUS VITAMIN D PO) Take by mouth 2 (two) times daily. Take two tablets daily     .      . insulin aspart protamine-insulin aspart (NOVOLOG MIX  70/30 FLEXPEN) (70-30) 100 UNIT/ML injection Inject 40 Units into the skin 2 (two) times daily with a meal.    . Insulin Syringe-Needle U-100 (B-D INSULIN SYRINGE) 31G X 5/16" 0.3 ML MISC by Does not apply route. Two times a day     . levothyroxine (SYNTHROID) 100 MCG tablet Take 100 mcg by mouth daily.    . metFORMIN (GLUCOPHAGE) 1000 MG tablet Take 1 tablet (1,000 mg total) by mouth 2 (two) times daily with a meal.    . Multiple Vitamin (MULTIVITAMIN) capsule Take 1 capsule by mouth daily.      .      . pravastatin (PRAVACHOL) 40 MG tablet Take 1 tablet (40 mg total) by mouth daily.    . propranolol (INDERAL) 20 MG tablet Take 20 mg by mouth 2 (two) times daily.    .            Review of Systems     Objective:   Physical Exam        Assessment & Plan:

## 2012-01-13 NOTE — Assessment & Plan Note (Signed)
SX CONTROLLED ON OMEPRAZOLE.  CONTINUE OMP. REFILL x1 YEAR. OPV IN 1 YEAR.

## 2012-01-13 NOTE — Assessment & Plan Note (Signed)
HIGH FIBER DIET TCS IN 2019

## 2012-01-13 NOTE — Progress Notes (Signed)
Faxed to PCP

## 2012-01-13 NOTE — Patient Instructions (Signed)
CONTINUE OMEPRAZOLE.  FOLLOW A HIGH FIBER/DIABETIC DIET. SEE INFO ON HIGH FIBER DIET. AVOID ITEMS THAT CAUSE BLOATING AND GAS.  DRINK WATER TO KEEP YOUR URINE LIGHT YELLOW.  CONTINUE TO LOSE WEIGHT.  FOLLOW UP IN 1 YEAR.    High-Fiber Diet A high-fiber diet changes your normal diet to include more whole grains, legumes, fruits, and vegetables. Changes in the diet involve replacing refined carbohydrates with unrefined foods. The calorie level of the diet is essentially unchanged. The Dietary Reference Intake (recommended amount) for adult males is 38 grams per day. For adult females, it is 25 grams per day. Pregnant and lactating women should consume 28 grams of fiber per day. Fiber is the intact part of a plant that is not broken down during digestion. Functional fiber is fiber that has been isolated from the plant to provide a beneficial effect in the body. PURPOSE  Increase stool bulk.   Ease and regulate bowel movements.   Lower cholesterol.  INDICATIONS THAT YOU NEED MORE FIBER  Constipation and hemorrhoids.   Uncomplicated diverticulosis (intestine condition) and irritable bowel syndrome.   Weight management.   As a protective measure against hardening of the arteries (atherosclerosis), diabetes, and cancer.   GUIDELINES FOR INCREASING FIBER IN THE DIET  Start adding fiber to the diet slowly. A gradual increase of about 5 more grams (2 slices of whole-wheat bread, 2 servings of most fruits or vegetables, or 1 bowl of high-fiber cereal) per day is best. Too rapid an increase in fiber may result in constipation, flatulence, and bloating.   Drink enough water and fluids to keep your urine clear or pale yellow. Water, juice, or caffeine-free drinks are recommended. Not drinking enough fluid may cause constipation.   Eat a variety of high-fiber foods rather than one type of fiber.   Try to increase your intake of fiber through using high-fiber foods rather than fiber pills or  supplements that contain small amounts of fiber.   The goal is to change the types of food eaten. Do not supplement your present diet with high-fiber foods, but replace foods in your present diet.  INCLUDE A VARIETY OF FIBER SOURCES  Replace refined and processed grains with whole grains, canned fruits with fresh fruits, and incorporate other fiber sources. White rice, white breads, and most bakery goods contain little or no fiber.   Brown whole-grain rice, buckwheat oats, and many fruits and vegetables are all good sources of fiber. These include: broccoli, Brussels sprouts, cabbage, cauliflower, beets, sweet potatoes, white potatoes (skin on), carrots, tomatoes, eggplant, squash, berries, fresh fruits, and dried fruits.   Cereals appear to be the richest source of fiber. Cereal fiber is found in whole grains and bran. Bran is the fiber-rich outer coat of cereal grain, which is largely removed in refining. In whole-grain cereals, the bran remains. In breakfast cereals, the largest amount of fiber is found in those with "bran" in their names. The fiber content is sometimes indicated on the label.   You may need to include additional fruits and vegetables each day.   In baking, for 1 cup white flour, you may use the following substitutions:   1 cup whole-wheat flour minus 2 tablespoons.   1/2 cup white flour plus 1/2 cup whole-wheat flour.

## 2012-01-14 NOTE — Progress Notes (Signed)
Reminder in epic to follow up with SF in one year and tcs in 2019

## 2012-02-08 ENCOUNTER — Telehealth: Payer: Self-pay | Admitting: Family Medicine

## 2012-02-08 DIAGNOSIS — E109 Type 1 diabetes mellitus without complications: Secondary | ICD-10-CM

## 2012-02-08 NOTE — Telephone Encounter (Signed)
Pt states she doesn't need any med sent in now but would call when she did

## 2012-03-07 ENCOUNTER — Telehealth: Payer: Self-pay | Admitting: Family Medicine

## 2012-03-07 DIAGNOSIS — E109 Type 1 diabetes mellitus without complications: Secondary | ICD-10-CM

## 2012-03-07 MED ORDER — METFORMIN HCL 1000 MG PO TABS: 1000.0000 mg | ORAL_TABLET | Freq: Two times a day (BID) | ORAL | Status: DC

## 2012-03-07 NOTE — Telephone Encounter (Signed)
Sent in

## 2012-04-11 ENCOUNTER — Telehealth: Payer: Self-pay | Admitting: Family Medicine

## 2012-04-11 MED ORDER — BENAZEPRIL-HYDROCHLOROTHIAZIDE 20-12.5 MG PO TABS: ORAL_TABLET | ORAL | Status: DC

## 2012-04-11 NOTE — Telephone Encounter (Signed)
Sent in

## 2012-05-05 ENCOUNTER — Encounter: Payer: Medicare Other | Admitting: Family Medicine

## 2012-05-09 ENCOUNTER — Telehealth: Payer: Self-pay | Admitting: Family Medicine

## 2012-05-09 ENCOUNTER — Other Ambulatory Visit: Payer: Self-pay

## 2012-05-09 MED ORDER — PRAVASTATIN SODIUM 40 MG PO TABS: 40.0000 mg | ORAL_TABLET | Freq: Every day | ORAL | Status: DC

## 2012-05-09 NOTE — Telephone Encounter (Signed)
Med refilled.

## 2012-05-17 ENCOUNTER — Encounter: Payer: Medicare Other | Admitting: Family Medicine

## 2012-05-26 ENCOUNTER — Other Ambulatory Visit: Payer: Self-pay | Admitting: Family Medicine

## 2012-05-26 DIAGNOSIS — Z139 Encounter for screening, unspecified: Secondary | ICD-10-CM

## 2012-05-31 ENCOUNTER — Encounter: Payer: Self-pay | Admitting: Family Medicine

## 2012-05-31 ENCOUNTER — Ambulatory Visit (INDEPENDENT_AMBULATORY_CARE_PROVIDER_SITE_OTHER): Payer: Medicare Other | Admitting: Family Medicine

## 2012-05-31 VITALS — BP 136/78 | HR 94 | Resp 18 | Ht 68.0 in | Wt 215.0 lb

## 2012-05-31 DIAGNOSIS — I10 Essential (primary) hypertension: Secondary | ICD-10-CM

## 2012-05-31 DIAGNOSIS — Z1212 Encounter for screening for malignant neoplasm of rectum: Secondary | ICD-10-CM

## 2012-05-31 DIAGNOSIS — Z1211 Encounter for screening for malignant neoplasm of colon: Secondary | ICD-10-CM

## 2012-05-31 DIAGNOSIS — Z0000 Encounter for general adult medical examination without abnormal findings: Secondary | ICD-10-CM

## 2012-05-31 DIAGNOSIS — R5381 Other malaise: Secondary | ICD-10-CM

## 2012-05-31 DIAGNOSIS — R5383 Other fatigue: Secondary | ICD-10-CM

## 2012-05-31 DIAGNOSIS — E0590 Thyrotoxicosis, unspecified without thyrotoxic crisis or storm: Secondary | ICD-10-CM

## 2012-05-31 DIAGNOSIS — E669 Obesity, unspecified: Secondary | ICD-10-CM

## 2012-05-31 DIAGNOSIS — E785 Hyperlipidemia, unspecified: Secondary | ICD-10-CM

## 2012-05-31 DIAGNOSIS — E1065 Type 1 diabetes mellitus with hyperglycemia: Secondary | ICD-10-CM

## 2012-05-31 NOTE — Progress Notes (Signed)
Subjective:    Patient ID: Stacey Hanson, female    DOB: April 28, 1945, 67 y.o.   MRN: [id number]  HPI  Preventive Screening-Counseling & Management   Patient present here today for a Medicare annual wellness visit.   Current Problems (verified)   Medications Prior to Visit Allergies (verified)   PAST HISTORY  Family History 2 brothers deceased of MI , ages 43, 21,one in an MVA at age 97. 2 sisters diabetic   Social History Single, Mom of 3 adult sons. No cigarette , alcohol, no drugs Retired from housekeeping in 2012   Risk Factors  Current exercise habits:none currently, will resume [employer]    Dietary issues discussed:Low carb diet , rich in vegetable, water as the drink   Cardiac risk factors: IDDM, HTN, Hyperlipidemia  Depression Screen  (Note: if answer to either of the following is "Yes", a more complete depression screening is indicated)   Over the past two weeks, have you felt down, depressed or hopeless? No  Over the past two weeks, have you felt little interest or pleasure in doing things? No  Have you lost interest or pleasure in daily life? No  Do you often feel hopeless? No  Do you cry easily over simple problems? No   Activities of Daily Living  In your present state of health, do you have any difficulty performing the following activities?  Driving?: No, however not much night driving Managing money?: No Feeding yourself?:No Getting from bed to chair?:No Climbing a flight of stairs?:No Getting to the toilet?:No  Preparing food and eating?:No Bathing or showering?:No Getting dressed?:NoUsing the toilet?:No Moving around from place to place?: No  Fall Risk Assessment In the past year have you fallen or had a near fall?:No Are you currently taking any medications that make you dizziness?:little bit not sure which    Hearing Difficulties: No Do you often ask people to speak up or repeat themselves?:No Do you experience ringing or noises in your  ears?:No Do you have difficulty understanding soft or whispered voices?:No  Cognitive Testing  Alert? Yes Normal Appearance?Yes  Oriented to person? Yes Place? Yes  Time? Yes  Displays appropriate judgment?Yes  Can read the correct time from a watch face? yes Are you having problems remembering things?No  Advanced Directives have been discussed with the patient?Yes , full code   List the Names of Other Physician/Practitioners you currently use: see list   Indicate any recent Medical Services you may have received from other than [facility] providers in the past year (date may be approximate).   Assessment:    Annual Wellness Exam   Plan:    During the course of the visit the patient was educated and counseled about appropriate screening and preventive services including:  A healthy diet is rich in fruit, vegetables and whole grains. Poultry fish, nuts and beans are a healthy choice for protein rather then red meat. A low sodium diet and drinking 64 ounces of water daily is generally recommended. Oils and sweet should be limited. Carbohydrates especially for those who are diabetic or overweight, should be limited to 30-45 gram per meal. It is important to eat on a regular schedule, at least 3 times daily. Snacks should be primarily fruits, vegetables or nuts. It is important that you exercise regularly at least 30 minutes 5 times a week. If you develop chest pain, have severe difficulty breathing, or feel very tired, stop exercising immediately and seek medical attention  Immunization reviewed and updated. Cancer screening reviewed and  updated    Patient Instructions (the written plan) was given to the patient.  Medicare Attestation  I have personally reviewed:  The patient's medical and social history  Their use of alcohol, tobacco or illicit drugs  Their current medications and supplements  The patient's functional ability including ADLs,fall risks, home safety risks, cognitive, and  hearing and visual impairment  Diet and physical activities  Evidence for depression or mood disorders  The patient's weight, height, BMI, and visual acuity have been recorded in the chart. I have made referrals, counseling, and provided education to the patient based on review of the above and I have provided the patient with a written personalized care plan for preventive services.     Review of Systems     Objective:   Physical Exam Rectal exam done at this visit as past due No mass, heme negative stool       Assessment & Plan:

## 2012-05-31 NOTE — Patient Instructions (Addendum)
F/u in 4 month  CMP and EGFR, hBA1C , CBC today, rectal exam today   Fasting lipid , cmp , hBA1C in 4 month   Please start regular exercise and cut back on breads  Please work on weight loss.  Please check into the senior citizens group,I believe you will enjoy this

## 2012-06-01 LAB — CBC
HCT: 38.7 % (ref 36.0–46.0)
MCV: 88.4 fL (ref 78.0–100.0)
RBC: 4.38 MIL/uL (ref 3.87–5.11)
RDW: 14.4 % (ref 11.5–15.5)
WBC: 8.7 10*3/uL (ref 4.0–10.5)

## 2012-06-01 LAB — HEMOGLOBIN A1C
Hgb A1c MFr Bld: 8.7 % — ABNORMAL HIGH (ref <5.7)
Mean Plasma Glucose: 203 mg/dL — ABNORMAL HIGH (ref <117)

## 2012-06-01 LAB — COMPLETE METABOLIC PANEL WITH GFR
AST: 18 U/L (ref 0–37)
Albumin: 4.6 g/dL (ref 3.5–5.2)
Alkaline Phosphatase: 89 U/L (ref 39–117)
BUN: 16 mg/dL (ref 6–23)
Potassium: 4.4 mEq/L (ref 3.5–5.3)
Sodium: 136 mEq/L (ref 135–145)

## 2012-06-01 MED ORDER — PRAVASTATIN SODIUM 40 MG PO TABS: 40.0000 mg | ORAL_TABLET | Freq: Every day | ORAL | Status: DC

## 2012-06-01 MED ORDER — INSULIN ASPART PROT & ASPART (70-30 MIX) 100 UNIT/ML ~~LOC~~ SUSP: SUBCUTANEOUS | Status: DC

## 2012-06-05 DIAGNOSIS — Z0000 Encounter for general adult medical examination without abnormal findings: Secondary | ICD-10-CM | POA: Insufficient documentation

## 2012-06-05 NOTE — Assessment & Plan Note (Signed)
Uncontrolled, insulin dose increased

## 2012-06-05 NOTE — Assessment & Plan Note (Signed)
Annual wellness exam as documented. Pt is able to live independently and care for herself. She is encouraged to become involved in a senior citizens group as well as to use her [employer] access to regularly exercise

## 2012-06-20 ENCOUNTER — Ambulatory Visit (HOSPITAL_COMMUNITY)
Admission: RE | Admit: 2012-06-20 | Discharge: 2012-06-20 | Disposition: A | Discharge status: Home / Self Care | Payer: Medicare Other | Source: Ambulatory Visit | Attending: Family Medicine | Admitting: Family Medicine

## 2012-06-20 DIAGNOSIS — Z1231 Encounter for screening mammogram for malignant neoplasm of breast: Secondary | ICD-10-CM | POA: Insufficient documentation

## 2012-06-20 DIAGNOSIS — Z139 Encounter for screening, unspecified: Secondary | ICD-10-CM

## 2012-08-22 ENCOUNTER — Other Ambulatory Visit: Payer: Self-pay

## 2012-08-22 MED ORDER — "INSULIN SYRINGE-NEEDLE U-100 31G X 5/16"" 0.3 ML MISC": Status: DC

## 2012-08-29 ENCOUNTER — Other Ambulatory Visit: Payer: Self-pay

## 2012-08-29 MED ORDER — INSULIN PEN NEEDLE 29G X 12.7MM MISC: Status: DC

## 2012-09-05 ENCOUNTER — Other Ambulatory Visit: Payer: Self-pay

## 2012-09-05 DIAGNOSIS — E109 Type 1 diabetes mellitus without complications: Secondary | ICD-10-CM

## 2012-09-05 MED ORDER — METFORMIN HCL 1000 MG PO TABS: 1000.0000 mg | ORAL_TABLET | Freq: Two times a day (BID) | ORAL | Status: DC

## 2012-09-24 LAB — LIPID PANEL
HDL: 58 mg/dL (ref >39)
LDL Cholesterol: 81 mg/dL (ref 0–99)
Total CHOL/HDL Ratio: 2.7 Ratio

## 2012-09-24 LAB — COMPREHENSIVE METABOLIC PANEL
AST: 19 U/L (ref 0–37)
Alkaline Phosphatase: 78 U/L (ref 39–117)
BUN: 18 mg/dL (ref 6–23)
Creat: 1.03 mg/dL (ref 0.50–1.10)
Total Bilirubin: 0.4 mg/dL (ref 0.3–1.2)

## 2012-09-24 LAB — HEMOGLOBIN A1C: Hgb A1c MFr Bld: 7 % — ABNORMAL HIGH (ref <5.7)

## 2012-10-03 ENCOUNTER — Encounter: Payer: Self-pay | Admitting: Family Medicine

## 2012-10-03 ENCOUNTER — Ambulatory Visit (INDEPENDENT_AMBULATORY_CARE_PROVIDER_SITE_OTHER): Payer: Medicare Other | Admitting: Family Medicine

## 2012-10-03 VITALS — BP 126/76 | HR 82 | Resp 18 | Ht 68.0 in | Wt 212.0 lb

## 2012-10-03 DIAGNOSIS — I10 Essential (primary) hypertension: Secondary | ICD-10-CM

## 2012-10-03 DIAGNOSIS — E785 Hyperlipidemia, unspecified: Secondary | ICD-10-CM

## 2012-10-03 DIAGNOSIS — E1065 Type 1 diabetes mellitus with hyperglycemia: Secondary | ICD-10-CM

## 2012-10-03 DIAGNOSIS — E669 Obesity, unspecified: Secondary | ICD-10-CM

## 2012-10-03 DIAGNOSIS — IMO0001 Reserved for inherently not codable concepts without codable children: Secondary | ICD-10-CM

## 2012-10-03 MED ORDER — BENAZEPRIL-HYDROCHLOROTHIAZIDE 20-12.5 MG PO TABS: ORAL_TABLET | ORAL | Status: DC

## 2012-10-03 MED ORDER — PRAVASTATIN SODIUM 40 MG PO TABS: 40.0000 mg | ORAL_TABLET | Freq: Every day | ORAL | Status: DC

## 2012-10-03 NOTE — Progress Notes (Signed)
  Subjective:    Patient ID: Stacey Hanson, female    DOB: [birth year], 67 y.o.   MRN: [id number]  HPI  The PT is here for follow up and re-evaluation of chronic medical conditions, medication management and review of any available recent lab and radiology data.  Preventive health is updated, specifically  Cancer screening and Immunization.   Questions or concerns regarding consultations or procedures which the PT has had in the interim are  addressed. The PT denies any adverse reactions to current medications since the last visit.  There are no new concerns.  There are no specific complaints  Test blood sugars two to three times daily , and denies polyuria , polydipsia, blurred vision or hypoglycemic episodes, reports marked improvement in her sugar    Review of Systems    See HPI'Denies recent fever or chills. Denies sinus pressure, nasal congestion, ear pain or sore throat. Denies chest congestion, productive cough or wheezing. Denies chest pains, palpitations and leg swelling Denies abdominal pain, nausea, vomiting,diarrhea or constipation.   Denies dysuria, frequency, hesitancy or incontinence. Denies disabling Patient alert and oriented and in no cardiopulmonary distress.  HEENT: No facial asymmetry, EOMI, no sinus tenderness,  oropharynx pink and moist.  Neck supple no adenopathy.  Chest: Clear to auscultation bilaterally.  CVS: S1, S2 no murmurs, no S3.  ABD: Soft non tender. Bowel sounds normal.  Ext: No edema  MS: Adequate though reduced  ROM spine, shoulders, hips and knees.  Skin: Intact, no ulcerations or rash noted.  Psych: Good eye contact, normal affect. Memory intact not anxious or depressed appearing.  CNS: CN 2-12 intact, power, tone and sensation normal throughout.  joint pain, swelling and limitation in mobility. Denies headaches, seizures, numbness, or tingling. Denies depression, anxiety or insomnia. Denies skin break down or rash.     Objective:    Physical Exam        Assessment & Plan:

## 2012-10-03 NOTE — Patient Instructions (Addendum)
F/u early December, call if you need me before.   EXCELLENT labs and blood pressure, keep it up  Call in October  For flu vaccine   HBa1C , chem 7 and EGFR and microalb early Decemebr before visit  Keep regular exercise and watch your eating

## 2012-10-04 ENCOUNTER — Ambulatory Visit: Payer: Medicare Other | Admitting: Family Medicine

## 2012-10-05 ENCOUNTER — Other Ambulatory Visit: Payer: Self-pay | Admitting: Family Medicine

## 2012-10-15 NOTE — Assessment & Plan Note (Signed)
Improved. Pt applauded on succesful weight loss through lifestyle change, and encouraged to continue same. Weight loss goal set for the next several months.  

## 2012-10-15 NOTE — Assessment & Plan Note (Signed)
Hyperlipidemia:Low fat diet discussed and encouraged.  Controlled, no change in medication   

## 2012-10-15 NOTE — Assessment & Plan Note (Signed)
Controlled, no change in medication DASH diet and commitment to daily physical activity for a minimum of 30 minutes discussed and encouraged, as a part of hypertension management. The importance of attaining a healthy weight is also discussed.  

## 2012-10-15 NOTE — Assessment & Plan Note (Signed)
mARKED IMPROVEMENT, CURRENTLY CONTROLLED Patient advised to reduce carb and sweets, commit to regular physical activity, take meds as prescribed, test blood as directed, and attempt to lose weight, to improve blood sugar control.

## 2012-10-17 ENCOUNTER — Other Ambulatory Visit (INDEPENDENT_AMBULATORY_CARE_PROVIDER_SITE_OTHER): Payer: Self-pay | Admitting: Otolaryngology

## 2012-10-17 DIAGNOSIS — R221 Localized swelling, mass and lump, neck: Secondary | ICD-10-CM

## 2012-10-20 ENCOUNTER — Ambulatory Visit (HOSPITAL_COMMUNITY)
Admission: RE | Admit: 2012-10-20 | Discharge: 2012-10-20 | Disposition: A | Discharge status: Home / Self Care | Payer: Medicare Other | Source: Ambulatory Visit | Attending: Otolaryngology | Admitting: Otolaryngology

## 2012-10-20 DIAGNOSIS — R220 Localized swelling, mass and lump, head: Secondary | ICD-10-CM | POA: Insufficient documentation

## 2012-10-20 DIAGNOSIS — E041 Nontoxic single thyroid nodule: Secondary | ICD-10-CM | POA: Insufficient documentation

## 2012-10-20 DIAGNOSIS — R221 Localized swelling, mass and lump, neck: Secondary | ICD-10-CM

## 2012-10-25 ENCOUNTER — Other Ambulatory Visit (INDEPENDENT_AMBULATORY_CARE_PROVIDER_SITE_OTHER): Payer: Self-pay | Admitting: Otolaryngology

## 2012-10-25 DIAGNOSIS — E079 Disorder of thyroid, unspecified: Secondary | ICD-10-CM

## 2012-11-03 ENCOUNTER — Ambulatory Visit (HOSPITAL_COMMUNITY): Payer: Medicare Other

## 2012-11-16 ENCOUNTER — Ambulatory Visit (INDEPENDENT_AMBULATORY_CARE_PROVIDER_SITE_OTHER): Payer: Medicare Other

## 2012-11-16 DIAGNOSIS — Z23 Encounter for immunization: Secondary | ICD-10-CM

## 2012-11-17 ENCOUNTER — Ambulatory Visit (INDEPENDENT_AMBULATORY_CARE_PROVIDER_SITE_OTHER): Payer: Medicare Other | Admitting: Otolaryngology

## 2012-11-17 DIAGNOSIS — D449 Neoplasm of uncertain behavior of unspecified endocrine gland: Secondary | ICD-10-CM

## 2012-11-20 ENCOUNTER — Other Ambulatory Visit: Payer: Self-pay | Admitting: Family Medicine

## 2012-11-21 ENCOUNTER — Other Ambulatory Visit: Payer: Self-pay

## 2012-11-21 MED ORDER — INSULIN ASPART PROT & ASPART (70-30 MIX) 100 UNIT/ML ~~LOC~~ SUSP: SUBCUTANEOUS | Status: DC

## 2012-12-05 ENCOUNTER — Other Ambulatory Visit: Payer: Self-pay

## 2012-12-05 MED ORDER — BENAZEPRIL-HYDROCHLOROTHIAZIDE 20-12.5 MG PO TABS: ORAL_TABLET | ORAL | Status: DC

## 2012-12-22 ENCOUNTER — Encounter: Payer: Self-pay | Admitting: Orthopedic Surgery

## 2012-12-22 ENCOUNTER — Ambulatory Visit (INDEPENDENT_AMBULATORY_CARE_PROVIDER_SITE_OTHER): Payer: Medicare Other

## 2012-12-22 ENCOUNTER — Ambulatory Visit (INDEPENDENT_AMBULATORY_CARE_PROVIDER_SITE_OTHER): Payer: Medicare Other | Admitting: Orthopedic Surgery

## 2012-12-22 VITALS — BP 121/84 | Ht 68.0 in | Wt 205.0 lb

## 2012-12-22 DIAGNOSIS — M1710 Unilateral primary osteoarthritis, unspecified knee: Secondary | ICD-10-CM

## 2012-12-22 DIAGNOSIS — Z96651 Presence of right artificial knee joint: Secondary | ICD-10-CM

## 2012-12-22 DIAGNOSIS — Z96659 Presence of unspecified artificial knee joint: Secondary | ICD-10-CM

## 2012-12-22 NOTE — Progress Notes (Signed)
Patient ID: Stacey Hanson, female   DOB: 02/12/1946, 67 y.o.   MRN: [id number]  Chief Complaint  Patient presents with  . Follow-up    One year follow up Right Knee     The patient has been doing well she's been working out, she's lost a lot of weight and her knee feels good  No catching locking or giving way.  Her vital signs are stable her appearance is normal she is oriented x3 her mood is normal ambulation is normal with a valgus knee her range of motion is excellent her knee is stable her motor exam is normal her scans intact  Her x-ray shows valgus arthritis fairly severe but she is asymptomatic  We recommend x-ray again in a year but no surgery at this time she is functioning well without pain

## 2013-01-04 ENCOUNTER — Telehealth: Payer: Self-pay | Admitting: Family Medicine

## 2013-01-04 NOTE — Telephone Encounter (Signed)
3 refills given on 10/6

## 2013-01-09 ENCOUNTER — Other Ambulatory Visit: Payer: Self-pay

## 2013-01-09 NOTE — Telephone Encounter (Signed)
Pt would like her refill done because she get this done in Nov. but could not get an appointment until Dec. She doe not want her insurance to miss up because she get a yearly refill in Nov.

## 2013-01-10 ENCOUNTER — Telehealth: Payer: Self-pay

## 2013-01-10 MED ORDER — OMEPRAZOLE 20 MG PO CPDR: DELAYED_RELEASE_CAPSULE | ORAL | Status: DC

## 2013-01-10 NOTE — Telephone Encounter (Signed)
Patient contacted on behalf of [facility] she is due for a Nephropathy screening for diabetes.  She is asked to contact Dr. Anthony Sar office for lab work.

## 2013-01-30 ENCOUNTER — Telehealth: Payer: Self-pay | Admitting: Family Medicine

## 2013-01-30 NOTE — Telephone Encounter (Signed)
Pt left message for me to call someone she knows who works with [facility] medicare  Pt since having problems getting her meds PA'd . Unsure exactly what meds, so nurse pls contact pt and find out the names of meds the patient needs . I have left the name and number of the person at [facility] medicare to contact for help with ANY and ALL pA's when we have problems she says

## 2013-02-02 ENCOUNTER — Ambulatory Visit (INDEPENDENT_AMBULATORY_CARE_PROVIDER_SITE_OTHER): Payer: Medicare Other | Admitting: Family Medicine

## 2013-02-02 ENCOUNTER — Encounter: Payer: Self-pay | Admitting: Family Medicine

## 2013-02-02 ENCOUNTER — Encounter (INDEPENDENT_AMBULATORY_CARE_PROVIDER_SITE_OTHER): Payer: Self-pay

## 2013-02-02 VITALS — BP 148/80 | HR 96 | Resp 16 | Ht 68.0 in | Wt 201.8 lb

## 2013-02-02 DIAGNOSIS — E1065 Type 1 diabetes mellitus with hyperglycemia: Secondary | ICD-10-CM

## 2013-02-02 DIAGNOSIS — E785 Hyperlipidemia, unspecified: Secondary | ICD-10-CM

## 2013-02-02 DIAGNOSIS — E669 Obesity, unspecified: Secondary | ICD-10-CM

## 2013-02-02 DIAGNOSIS — K219 Gastro-esophageal reflux disease without esophagitis: Secondary | ICD-10-CM

## 2013-02-02 DIAGNOSIS — I10 Essential (primary) hypertension: Secondary | ICD-10-CM

## 2013-02-02 DIAGNOSIS — A048 Other specified bacterial intestinal infections: Secondary | ICD-10-CM

## 2013-02-02 MED ORDER — INSULIN ASPART PROT & ASPART (70-30 MIX) 100 UNIT/ML PEN: PEN_INJECTOR | SUBCUTANEOUS | Status: DC

## 2013-02-02 NOTE — Patient Instructions (Addendum)
Pelvic and breast exam April 6 or after   You will get script for new meter and testing supplies for twice daily testing   You will be referred to HD for help with your insulin   Labs discussed at visit  It is important that you exercise regularly at least 30 minutes 5 times a week. If you develop chest pain, have severe difficulty breathing, or feel very tired, stop exercising immediately and seek medical attention    Do not worry about what you have no control over  A healthy diet is rich in fruit, vegetables and whole grains. Poultry fish, nuts and beans are a healthy choice for protein rather then red meat. A low sodium diet and drinking 64 ounces of water daily is generally recommended. Oils and sweet should be limited. Carbohydrates especially for those who are diabetic or overweight, should be limited to 60-45 gram per meal. It is important to eat on a regular schedule, at least 3 times daily. Snacks should be primarily fruits, vegetables or nuts.

## 2013-02-02 NOTE — Telephone Encounter (Signed)
Called and spoke with pharmacy and they stated that all her medicines are covered under her insurance and are being filed under [employer].  Her Novolog 70/30 has a copay of 174 and patient may be in the donut hole.  But otherwise her meds are covered.  FYI

## 2013-02-02 NOTE — Progress Notes (Signed)
   Subjective:    Patient ID: Stacey Hanson, female    DOB: Dec 16, 1945, 67 y.o.   MRN: [id number]  HPI The PT is here for follow up and re-evaluation of chronic medical conditions, medication management and review of any available recent lab and radiology data.  Preventive health is updated, specifically  Cancer screening and Immunization.   Questions or concerns regarding consultations or procedures which the PT has had in the interim are  addressed. The PT denies any adverse reactions to current medications since the last visit.  There are no new concerns.  Pt has anxiety and frustration about lack of access to her insulin, she has been in the donut hole since approx August , and has not followed thru pn applying for help through the health dept, had no success last year reportedly    Review of Systems See HPI Denies recent fever or chills. Denies sinus pressure, nasal congestion, ear pain or sore throat. Denies chest congestion, productive cough or wheezing. Denies chest pains, palpitations and leg swelling Denies abdominal pain, nausea, vomiting,diarrhea or constipation.   Denies dysuria, frequency, hesitancy or incontinence. Denies uncontrolled  joint pain, swelling and limitation in mobility. Denies headaches, seizures, numbness, or tingling. Denies depression, anxiety or insomnia.Marked improvement with relocation of her home to smaller living space which she cam maintain as well as since starting regular exercise Denies skin break down or rash.        Objective:   Physical Exam  Patient alert and oriented and in no cardiopulmonary distress.  HEENT: No facial asymmetry, EOMI, no sinus tenderness,  oropharynx pink and moist.  Neck supple no adenopathy.  Chest: Clear to auscultation bilaterally.  CVS: S1, S2 no murmurs, no S3.  ABD: Soft non tender. Bowel sounds normal.  Ext: No edema  MS: Adequate ROM spine, shoulders, hips and knees.  Skin: Intact, no ulcerations  or rash noted.  Psych: Good eye contact, normal affect. Memory intact not anxious or depressed appearing.  CNS: CN 2-12 intact, power, tone and sensation normal throughout.       Assessment & Plan:

## 2013-02-05 NOTE — Assessment & Plan Note (Signed)
Controlled, no change in medication  

## 2013-02-05 NOTE — Assessment & Plan Note (Signed)
Improved. Pt applauded on succesful weight loss through lifestyle change, and encouraged to continue same. Weight loss goal set for the next several months.  

## 2013-02-05 NOTE — Assessment & Plan Note (Signed)
Controlled, no change in medication DASH diet and commitment to daily physical activity for a minimum of 30 minutes discussed and encouraged, as a part of hypertension management. The importance of attaining a healthy weight is also discussed.  

## 2013-02-05 NOTE — Assessment & Plan Note (Signed)
Controlled, no change in medication Hyperlipidemia:Low fat diet discussed and encouraged.  \ 

## 2013-02-05 NOTE — Assessment & Plan Note (Signed)
Adequate control. No change in meds. Form completed and request sent to HD for help with insulij, pt has been in donut hole x 3 month Patient advised to reduce carb and sweets, commit to regular physical activity, take meds as prescribed, test blood as directed, and attempt to lose weight, to improve blood sugar control.

## 2013-02-07 ENCOUNTER — Other Ambulatory Visit: Payer: Self-pay | Admitting: Family Medicine

## 2013-02-08 ENCOUNTER — Encounter: Payer: Self-pay | Admitting: Gastroenterology

## 2013-02-08 ENCOUNTER — Encounter (INDEPENDENT_AMBULATORY_CARE_PROVIDER_SITE_OTHER): Payer: Self-pay

## 2013-02-08 ENCOUNTER — Ambulatory Visit (INDEPENDENT_AMBULATORY_CARE_PROVIDER_SITE_OTHER): Payer: Medicare Other | Admitting: Gastroenterology

## 2013-02-08 VITALS — BP 135/87 | HR 107 | Temp 99.0°F | Ht 68.0 in | Wt 200.8 lb

## 2013-02-08 DIAGNOSIS — K219 Gastro-esophageal reflux disease without esophagitis: Secondary | ICD-10-CM

## 2013-02-08 NOTE — Assessment & Plan Note (Signed)
SX CONTROLLED.  CONTINUE OMEPRAZOLE.  FOLLOW A HIGH FIBER/DIABETIC DIET. AVOID ITEMS THAT CAUSE BLOATING AND GAS.  DRINK WATER TO KEEP YOUR URINE LIGHT YELLOW.  CONTINUE TO LOSE WEIGHT.  FOLLOW UP IN 1 YEAR.

## 2013-02-08 NOTE — Patient Instructions (Signed)
CONTINUE OMEPRAZOLE.   FOLLOW A HIGH FIBER/DIABETIC DIET. AVOID ITEMS THAT CAUSE BLOATING AND GAS.   DRINK WATER TO KEEP YOUR URINE LIGHT YELLOW.   CONTINUE TO LOSE WEIGHT.   FOLLOW UP IN 1 YEAR.

## 2013-02-08 NOTE — Progress Notes (Signed)
cc'd to pcp 

## 2013-02-08 NOTE — Progress Notes (Signed)
Subjective:    Patient ID: Stacey Hanson, female    DOB: 1946-02-05, 67 y.o.   MRN: [id number]  Syliva Overman, MD  HPI LOST HER UNCLE LAST NIGHT. BP UP. WEIGHT DOWN SINCE 2012. LOST 12 LBS SINCE LAST NOV 2013. BMs: BID. PT DENIES FEVER, CHILLS, BRBPR, nausea, vomiting, melena, diarrhea, constipation, abd pain, problems swallowing, OR heartburn or indigestion.  Past Medical History  Diagnosis Date  . 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 Knee pain   . Obesity   . Hyperlipidemia   . Hypertension   . IDDM (insulin dependent diabetes mellitus) 1987  . Diabetes mellitus   . BMI 30.0-30.9,adult 2012 215 LBS    2004 198 LBS  . H. pylori infection 09/24/2010   Past Surgical History  Procedure Laterality Date  . Abdominal hysterectomy  1970  . Cholecystectomy  OCT 2010 BZ BILIARY DYSKINESIA    CHRONIC CHOLECYSTITIS  . Colonoscopy  MAY 2009 SCREENING    pTC TICS, SML IH  . Total abdominal hysterectomy  1970  . Upper gastrointestinal endoscopy  OCT 2004 RMR    DUO TIC, & ULCER  . Esophagogastroduodenoscopy  10/03/2010    Procedure: ESOPHAGOGASTRODUODENOSCOPY (EGD);  Surgeon: Arlyce Harman, MD;  Location: [facility];  Service: Endoscopy;  Laterality: N/A;  . Savory dilation  10/03/2010    Procedure: SAVORY DILATION;  Surgeon: Arlyce Harman, MD;  Location: [facility];  Service: Endoscopy;;  . Esophagogastroduodenoscopy  12/19/2002    RMR: Normal esophagus/Normal stomach/ Duodenum large bulbar diverticulum, 1 cm bulbar ulcer with surrounding/ inflammation as described above. Normal D2   Allergies  Allergen Reactions  . Simvastatin     REACTION: muscle cramps    Current Outpatient Prescriptions  Medication Sig Dispense Refill  . benazepril-hydrochlorthiazide (LOTENSIN HCT) 20-12.5 MG per tablet TAKE 2 TABLETS BY MOUTH EVERY MORNING  60 tablet  3  . Calcium Carbonate-Vitamin D (CALCIUM PLUS VITAMIN D PO) Take by mouth 2 (two) times daily. Take two tablets  daily       . Insulin Aspart Prot & Aspart (NOVOLOG MIX 70/30 FLEXPEN) (70-30) 100 UNIT/ML SUPN INJECT 40 UNITS INTO THE SKIN TWICE DAILY WITH A MEAL  24 mL  3  . levothyroxine (SYNTHROID) 100 MCG tablet Take 100 mcg by mouth daily.      . metFORMIN (GLUCOPHAGE) 1000 MG tablet Take 1 tablet (1,000 mg total) by mouth 2 (two) times daily with a meal.  60 tablet  5  . Multiple Vitamin (MULTIVITAMIN) capsule Take 1 capsule by mouth daily.        Marland Kitchen omeprazole (PRILOSEC) 20 MG capsule 1 PO EVERY MORNING    . pravastatin (PRAVACHOL) 40 MG tablet Take 1 tablet (40 mg total) by mouth daily.  90 tablet  0  . Blood Glucose Monitoring Suppl (TRUETRACK SMART SYSTEM) KIT by Does not apply route 2 (two) times daily. Two times a day testing       . glucose blood (TRUETRACK TEST) test strip 1 each by Other route 2 (two) times daily. Two times a day       . Insulin Pen Needle (BD ULTRA-FINE PEN NEEDLES) 29G X 12.7MM MISC USE AS DIRECTED  100 each  2    Review of Systems     Objective:   Physical Exam  Vitals reviewed. Constitutional: She is oriented to person, place, and time. She appears well-nourished. No distress.  HENT:  Head: Normocephalic and atraumatic.  Mouth/Throat: Oropharynx is clear and moist. No oropharyngeal exudate.  Eyes: Pupils are equal, round, and reactive to light. No scleral icterus.  Neck: Normal range of motion. Neck supple.  Cardiovascular: Normal rate, regular rhythm and normal heart sounds.   Pulmonary/Chest: Effort normal and breath sounds normal. No respiratory distress.  Abdominal: Soft. Bowel sounds are normal. She exhibits no distension. There is no tenderness.  Musculoskeletal: She exhibits no edema.  Neurological: She is alert and oriented to person, place, and time.  NO FOCAL DEFICITS    Psychiatric: She has a normal mood and affect.          Assessment & Plan:

## 2013-02-15 NOTE — Progress Notes (Signed)
Reminder in epic °

## 2013-02-16 ENCOUNTER — Telehealth: Payer: Self-pay | Admitting: Family Medicine

## 2013-02-16 DIAGNOSIS — E109 Type 1 diabetes mellitus without complications: Secondary | ICD-10-CM

## 2013-02-16 MED ORDER — METFORMIN HCL 1000 MG PO TABS: 1000.0000 mg | ORAL_TABLET | Freq: Two times a day (BID) | ORAL | Status: DC

## 2013-02-16 MED ORDER — PRAVASTATIN SODIUM 40 MG PO TABS: 40.0000 mg | ORAL_TABLET | Freq: Every day | ORAL | Status: DC

## 2013-02-16 NOTE — Telephone Encounter (Signed)
rx's refilled and patient aware

## 2013-02-20 ENCOUNTER — Encounter: Payer: Self-pay | Admitting: Family Medicine

## 2013-03-01 ENCOUNTER — Other Ambulatory Visit: Payer: Self-pay

## 2013-03-01 MED ORDER — HYDROCHLOROTHIAZIDE 25 MG PO TABS: 25.0000 mg | ORAL_TABLET | Freq: Every day | ORAL | Status: DC

## 2013-03-01 MED ORDER — BENAZEPRIL HCL 40 MG PO TABS: 40.0000 mg | ORAL_TABLET | Freq: Every day | ORAL | Status: DC

## 2013-03-07 ENCOUNTER — Telehealth: Payer: Self-pay | Admitting: Family Medicine

## 2013-03-07 NOTE — Telephone Encounter (Signed)
This has already been refilled and confirmed with the pharmacy they have it

## 2013-03-17 ENCOUNTER — Other Ambulatory Visit: Payer: Self-pay

## 2013-03-17 MED ORDER — PRAVASTATIN SODIUM 40 MG PO TABS: 40.0000 mg | ORAL_TABLET | Freq: Every day | ORAL | Status: DC

## 2013-03-21 ENCOUNTER — Other Ambulatory Visit: Payer: Self-pay

## 2013-03-21 MED ORDER — INSULIN ASPART PROT & ASPART (70-30 MIX) 100 UNIT/ML PEN: PEN_INJECTOR | SUBCUTANEOUS | Status: DC

## 2013-05-15 ENCOUNTER — Other Ambulatory Visit: Payer: Self-pay | Admitting: Family Medicine

## 2013-05-15 DIAGNOSIS — Z139 Encounter for screening, unspecified: Secondary | ICD-10-CM

## 2013-06-07 ENCOUNTER — Ambulatory Visit (INDEPENDENT_AMBULATORY_CARE_PROVIDER_SITE_OTHER): Payer: Medicare Other | Admitting: Family Medicine

## 2013-06-07 ENCOUNTER — Other Ambulatory Visit (HOSPITAL_COMMUNITY)
Admission: RE | Admit: 2013-06-07 | Discharge: 2013-06-07 | Disposition: A | Discharge status: Home / Self Care | Payer: Medicare Other | Source: Ambulatory Visit | Attending: Family Medicine | Admitting: Family Medicine

## 2013-06-07 ENCOUNTER — Encounter (INDEPENDENT_AMBULATORY_CARE_PROVIDER_SITE_OTHER): Payer: Self-pay

## 2013-06-07 ENCOUNTER — Encounter: Payer: Self-pay | Admitting: Family Medicine

## 2013-06-07 VITALS — BP 130/78 | HR 88 | Resp 16 | Wt 205.0 lb

## 2013-06-07 DIAGNOSIS — IMO0001 Reserved for inherently not codable concepts without codable children: Secondary | ICD-10-CM

## 2013-06-07 DIAGNOSIS — Z1239 Encounter for other screening for malignant neoplasm of breast: Secondary | ICD-10-CM

## 2013-06-07 DIAGNOSIS — Z13820 Encounter for screening for osteoporosis: Secondary | ICD-10-CM

## 2013-06-07 DIAGNOSIS — E785 Hyperlipidemia, unspecified: Secondary | ICD-10-CM

## 2013-06-07 DIAGNOSIS — Z794 Long term (current) use of insulin: Secondary | ICD-10-CM

## 2013-06-07 DIAGNOSIS — I10 Essential (primary) hypertension: Secondary | ICD-10-CM

## 2013-06-07 DIAGNOSIS — Z124 Encounter for screening for malignant neoplasm of cervix: Secondary | ICD-10-CM | POA: Insufficient documentation

## 2013-06-07 DIAGNOSIS — Z0000 Encounter for general adult medical examination without abnormal findings: Secondary | ICD-10-CM

## 2013-06-07 DIAGNOSIS — E1165 Type 2 diabetes mellitus with hyperglycemia: Secondary | ICD-10-CM

## 2013-06-07 DIAGNOSIS — Z1211 Encounter for screening for malignant neoplasm of colon: Secondary | ICD-10-CM

## 2013-06-07 DIAGNOSIS — K219 Gastro-esophageal reflux disease without esophagitis: Secondary | ICD-10-CM

## 2013-06-07 LAB — BASIC METABOLIC PANEL WITH GFR
BUN: 16 mg/dL (ref 6–23)
CHLORIDE: 101 meq/L (ref 96–112)
CO2: 25 meq/L (ref 19–32)
Calcium: 9.7 mg/dL (ref 8.4–10.5)
Creat: 0.77 mg/dL (ref 0.50–1.10)
GFR, Est African American: >89 mL/min
GFR, Est Non African American: 80 mL/min
Glucose, Bld: 85 mg/dL (ref 70–99)
POTASSIUM: 4.1 meq/L (ref 3.5–5.3)
Sodium: 136 mEq/L (ref 135–145)

## 2013-06-07 LAB — CBC WITH DIFFERENTIAL/PLATELET
Basophils Absolute: 0 10*3/uL (ref 0.0–0.1)
Basophils Relative: 0 % (ref 0–1)
Eosinophils Absolute: 0.1 10*3/uL (ref 0.0–0.7)
Eosinophils Relative: 1 % (ref 0–5)
HEMATOCRIT: 37 % (ref 36.0–46.0)
HEMOGLOBIN: 12.6 g/dL (ref 12.0–15.0)
LYMPHS ABS: 2.3 10*3/uL (ref 0.7–4.0)
LYMPHS PCT: 27 % (ref 12–46)
MCH: 30.3 pg (ref 26.0–34.0)
MCHC: 34.1 g/dL (ref 30.0–36.0)
MCV: 88.9 fL (ref 78.0–100.0)
MONO ABS: 0.6 10*3/uL (ref 0.1–1.0)
MONOS PCT: 7 % (ref 3–12)
NEUTROS ABS: 5.5 10*3/uL (ref 1.7–7.7)
Neutrophils Relative %: 65 % (ref 43–77)
Platelets: 470 10*3/uL — ABNORMAL HIGH (ref 150–400)
RBC: 4.16 MIL/uL (ref 3.87–5.11)
RDW: 14.3 % (ref 11.5–15.5)
WBC: 8.4 10*3/uL (ref 4.0–10.5)

## 2013-06-07 LAB — HEMOGLOBIN A1C
Hgb A1c MFr Bld: 8.1 % — ABNORMAL HIGH (ref <5.7)
MEAN PLASMA GLUCOSE: 186 mg/dL — AB (ref <117)

## 2013-06-07 LAB — POC HEMOCCULT BLD/STL (OFFICE/1-CARD/DIAGNOSTIC): Fecal Occult Blood, POC: NEGATIVE

## 2013-06-07 MED ORDER — PRAVASTATIN SODIUM 40 MG PO TABS: 40.0000 mg | ORAL_TABLET | Freq: Every day | ORAL | Status: DC

## 2013-06-07 NOTE — Assessment & Plan Note (Signed)
Annual exam as documented. Counseling done  re healthy lifestyle involving commitment to 150 minutes exercise per week, heart healthy diet, and attaining healthy weight.The importance of adequate sleep also discussed. Regular seat belt use  is also discussed. Changes in health habits are decided on by the patient with goals and time frames  set for achieving them. Immunization and cancer screening needs are specifically addressed at this visit.

## 2013-06-07 NOTE — Progress Notes (Signed)
   Subjective:    Patient ID: Stacey Hanson, female    DOB: [birth year], 68 y.o.   MRN: [id number]  HPI Patient is in for annual exam Health maintainance is reviewed and updated, specifically screening tests and recommended immunizations. Recent lab and radiologic data, since previous visit is also reviewed with the patient. Healthy lifestyle as far as commitment to regular physical activity, heart healthy diet , safe habits, as far as seat belt . The importance of adequate rest is also discussed.       Review of Systems See HPI Denies recent fever or chills. Denies sinus pressure, nasal congestion, ear pain or sore throat. Denies chest congestion, productive cough or wheezing. Denies chest pains, palpitations and leg swelling Denies abdominal pain, nausea, vomiting,diarrhea or constipation.   Denies dysuria, frequency, hesitancy or incontinence. Denies disabling  joint pain, swelling and limitation in mobility. Denies headaches, seizures, numbness, or tingling. Denies depression, anxiety or insomnia. Denies skin break down or rash.        Objective:   Physical Exam Pleasant well nourished female, alert and oriented x 3, in no cardio-pulmonary distress. Afebrile. HEENT No facial trauma or asymetry. Sinuses non tender.  EOMI, PERTL, fundoscopic exam  no hemorhage or exudate.  External ears normal, tympanic membranes clear. Oropharynx moist, no exudate, fairly  good dentition. Neck:decreased though adequate ROM, no adenopathy,JVD or thyromegaly.No bruits.  Chest: Clear to ascultation bilaterally.No crackles or wheezes. Non tender to palpation  Breast: No asymetry,no masses. No nipple discharge or inversion. No axillary or supraclavicular adenopathy  Cardiovascular system; Heart sounds normal,  S1 and  S2 ,no S3.  No murmur, or thrill. Apical beat not displaced Peripheral pulses normal.  Abdomen: Soft, non tender, no organomegaly or masses. No bruits. Bowel sounds  normal. No guarding, tenderness or rebound.  Rectal:  No mass. Guaiac negative stool.  GU: External genitalia normal. No lesions. Vaginal canal normal.Physiologic  discharge. Uterus absent, no adnexal masses, no  adnexal tenderness.  Decreased  ROM of spine, hips , shoulders and knees. No deformity ,swelling or crepitus noted. No muscle wasting or atrophy.   Neurologic: Cranial nerves 2 to 12 intact. Power, tone ,sensation and reflexes normal throughout. No disturbance in gait. No tremor.  Skin: Intact, no ulceration, erythema , scaling or rash noted. Pigmentation normal throughout  Psych; Normal mood and affect. Judgement and concentration normal        Assessment & Plan:  Routine general medical examination at a health care facility Annual exam as documented. Counseling done  re healthy lifestyle involving commitment to 150 minutes exercise per week, heart healthy diet, and attaining healthy weight.The importance of adequate sleep also discussed. Regular seat belt use  is also discussed. Changes in health habits are decided on by the patient with goals and time frames  set for achieving them. Immunization and cancer screening needs are specifically addressed at this visit.

## 2013-06-07 NOTE — Patient Instructions (Addendum)
F/u in 4 month, call if you need me today.  CBC, HBA1C chem 7 and EGFR today  Fasting lipid cmp and EGFR, hBA1C in August 2 to 3 days before next visit  You need a bone density test and will be referred to get one tio check for excessive bone thinning/osteoperosis

## 2013-06-19 ENCOUNTER — Ambulatory Visit (HOSPITAL_COMMUNITY)
Admission: RE | Admit: 2013-06-19 | Discharge: 2013-06-19 | Disposition: A | Discharge status: Home / Self Care | Payer: Medicare Other | Source: Ambulatory Visit | Attending: Family Medicine | Admitting: Family Medicine

## 2013-06-19 DIAGNOSIS — Z13820 Encounter for screening for osteoporosis: Secondary | ICD-10-CM | POA: Insufficient documentation

## 2013-06-20 ENCOUNTER — Encounter: Payer: Self-pay | Admitting: Family Medicine

## 2013-06-20 DIAGNOSIS — M8580 Other specified disorders of bone density and structure, unspecified site: Secondary | ICD-10-CM | POA: Insufficient documentation

## 2013-06-22 ENCOUNTER — Ambulatory Visit (HOSPITAL_COMMUNITY)
Admission: RE | Admit: 2013-06-22 | Discharge: 2013-06-22 | Disposition: A | Discharge status: Home / Self Care | Payer: Medicare Other | Source: Ambulatory Visit | Attending: Family Medicine | Admitting: Family Medicine

## 2013-06-22 DIAGNOSIS — Z1231 Encounter for screening mammogram for malignant neoplasm of breast: Secondary | ICD-10-CM | POA: Insufficient documentation

## 2013-06-22 DIAGNOSIS — Z139 Encounter for screening, unspecified: Secondary | ICD-10-CM

## 2013-07-11 ENCOUNTER — Telehealth: Payer: Self-pay | Admitting: Family Medicine

## 2013-07-12 MED ORDER — INSULIN PEN NEEDLE 29G X 12.7MM MISC: Status: DC

## 2013-07-12 NOTE — Telephone Encounter (Signed)
rx called into pharmacy

## 2013-07-18 ENCOUNTER — Telehealth: Payer: Self-pay | Admitting: Family Medicine

## 2013-07-18 ENCOUNTER — Other Ambulatory Visit: Payer: Self-pay

## 2013-07-18 MED ORDER — INSULIN ASPART PROT & ASPART (70-30 MIX) 100 UNIT/ML PEN: PEN_INJECTOR | SUBCUTANEOUS | Status: DC

## 2013-07-18 NOTE — Telephone Encounter (Signed)
Med refilled.

## 2013-08-04 ENCOUNTER — Other Ambulatory Visit: Payer: Self-pay

## 2013-08-04 ENCOUNTER — Telehealth: Payer: Self-pay | Admitting: *Deleted

## 2013-08-04 DIAGNOSIS — E109 Type 1 diabetes mellitus without complications: Secondary | ICD-10-CM

## 2013-08-04 MED ORDER — METFORMIN HCL 1000 MG PO TABS: 1000.0000 mg | ORAL_TABLET | Freq: Two times a day (BID) | ORAL | Status: DC

## 2013-08-04 NOTE — Telephone Encounter (Signed)
Med refilled to [facility]

## 2013-08-04 NOTE — Telephone Encounter (Signed)
Pt called wanting to get her metformin refilled per pt it is 1000 mg

## 2013-08-16 ENCOUNTER — Encounter (INDEPENDENT_AMBULATORY_CARE_PROVIDER_SITE_OTHER): Payer: Self-pay

## 2013-08-16 ENCOUNTER — Ambulatory Visit (INDEPENDENT_AMBULATORY_CARE_PROVIDER_SITE_OTHER): Payer: Medicare Other | Admitting: Family Medicine

## 2013-08-16 ENCOUNTER — Encounter: Payer: Self-pay | Admitting: Family Medicine

## 2013-08-16 VITALS — BP 140/80 | HR 82 | Resp 16 | Wt 205.0 lb

## 2013-08-16 DIAGNOSIS — Z794 Long term (current) use of insulin: Secondary | ICD-10-CM

## 2013-08-16 DIAGNOSIS — E1165 Type 2 diabetes mellitus with hyperglycemia: Secondary | ICD-10-CM

## 2013-08-16 DIAGNOSIS — L729 Follicular cyst of the skin and subcutaneous tissue, unspecified: Secondary | ICD-10-CM

## 2013-08-16 DIAGNOSIS — H6123 Impacted cerumen, bilateral: Secondary | ICD-10-CM

## 2013-08-16 DIAGNOSIS — M549 Dorsalgia, unspecified: Secondary | ICD-10-CM

## 2013-08-16 DIAGNOSIS — E1065 Type 1 diabetes mellitus with hyperglycemia: Secondary | ICD-10-CM

## 2013-08-16 DIAGNOSIS — IMO0002 Reserved for concepts with insufficient information to code with codable children: Secondary | ICD-10-CM

## 2013-08-16 DIAGNOSIS — L723 Sebaceous cyst: Secondary | ICD-10-CM

## 2013-08-16 DIAGNOSIS — H6120 Impacted cerumen, unspecified ear: Secondary | ICD-10-CM

## 2013-08-16 DIAGNOSIS — IMO0001 Reserved for inherently not codable concepts without codable children: Secondary | ICD-10-CM

## 2013-08-16 DIAGNOSIS — I10 Essential (primary) hypertension: Secondary | ICD-10-CM

## 2013-08-16 MED ORDER — NAPROXEN 375 MG PO TABS: 375.0000 mg | ORAL_TABLET | Freq: Two times a day (BID) | ORAL | Status: DC

## 2013-08-16 MED ORDER — KETOROLAC TROMETHAMINE 60 MG/2ML IM SOLN
60.0000 mg | Freq: Once | INTRAMUSCULAR | Status: AC
  Administered 2013-08-16: 60 mg via INTRAMUSCULAR

## 2013-08-16 MED ORDER — PREDNISONE 5 MG PO TABS: 5.0000 mg | ORAL_TABLET | Freq: Two times a day (BID) | ORAL | Status: AC

## 2013-08-16 NOTE — Assessment & Plan Note (Signed)
Direct pressure to large sebaceous cyst yielded large quantities of sebum No purulent drainage or bleeding from the cyst

## 2013-08-16 NOTE — Patient Instructions (Signed)
F/u as before  You are treated for acute back pain, , 2 medications are sent in, and injection in the office  Ear irrigation successful for impaction  Sebaceous cyst on back has been attended to, do not squeeze or traumatize the area, no infection present

## 2013-08-16 NOTE — Progress Notes (Signed)
   Subjective:    Patient ID: Stacey Hanson, female    DOB: 1945/09/07, 68 y.o.   MRN: [id number]  HPI 2 week h/o unprovoked back pain rated at a 9 radiating to back of thighs, she has been doing back exercises at  [facility] however. No weakness or numbness of lower extremities, no incontinence Known disc disease 3 week h/o bilateral ear fullness, and feeling "swimmy headed at times"  Cyst on back, painless, no drainage over past several  Months FBG averaging aroun 117 to 130   Review of Systems See HPI Denies recent fever or chills. Denies sinus pressure, nasal congestion, ear pain or sore throat. Denies chest congestion, productive cough or wheezing.IOccasional dry cough Denies chest pains, palpitations and leg swelling Denies abdominal pain, nausea, vomiting,diarrhea or constipation.   Denies dysuria, frequency, hesitancy or incontinence.  Denies headaches, seizures, numbness, or tingling. Denies depression, anxiety or insomnia. Denies skin break down or rash.         Objective:   Physical Exam  BP 140/80  Pulse 82  Resp 16  Wt 205 lb (92.987 kg)  SpO2 99% Patient alert and oriented and in no cardiopulmonary distress.Pt in pain  HEENT: No facial asymmetry, EOMI,   oropharynx pink and moist.  Neck supple no JVD, no mass.  Chest: Clear to auscultation bilaterally.  CVS: S1, S2 no murmurs, no S3.  ABD: Soft non tender.   Ext: No edema  MS: decreased  ROM lumbar  Spine,adequate in  shoulders, hips and knees.  Skin: Intact, sebaceous cyst in left lumbar area, no erythema , warmth or drainage  Psych: Good eye contact, normal affect. Memory intact not anxious or depressed appearing.  CNS: CN 2-12 intact, power,  normal throughout.no focal deficits noted.       Assessment & Plan:  BACK PAIN WITH RADICULOPATHY Flare x 2 weeks, toradol and prednisone x 5 days and , also naproxen   Excessive cerumen in both ear canals Bilateral ear flush  successful ,  No  trauma to eardrums  ESSENTIAL HYPERTENSION, BENIGN elelvated at thsi visit, generally good, will hold off on med change and review in 1 month DASH diet and commitment to daily physical activity for a minimum of 30 minutes discussed and encouraged, as a part of hypertension management. The importance of attaining a healthy weight is also discussed.   Cyst of skin Direct pressure to large sebaceous cyst yielded large quantities of sebum No purulent drainage or bleeding from the cyst  Diabetes mellitus, insulin dependent (IDDM), uncontrolled Seems to be well controlled based on pt reporting will f/u hBA1C next month, when due  Patient advised to reduce carb and sweets, commit to regular physical activity, take meds as prescribed, test blood as directed, and attempt to lose weight, to improve blood sugar control.

## 2013-08-16 NOTE — Assessment & Plan Note (Signed)
Seems to be well controlled based on pt reporting will f/u hBA1C next month, when due  Patient advised to reduce carb and sweets, commit to regular physical activity, take meds as prescribed, test blood as directed, and attempt to lose weight, to improve blood sugar control.

## 2013-08-16 NOTE — Assessment & Plan Note (Signed)
Flare x 2 weeks, toradol and prednisone x 5 days and , also naproxen

## 2013-08-16 NOTE — Assessment & Plan Note (Signed)
elelvated at thsi visit, generally good, will hold off on med change and review in 1 month DASH diet and commitment to daily physical activity for a minimum of 30 minutes discussed and encouraged, as a part of hypertension management. The importance of attaining a healthy weight is also discussed.

## 2013-08-16 NOTE — Assessment & Plan Note (Signed)
Bilateral ear flush  successful ,  No trauma to eardrums

## 2013-08-25 ENCOUNTER — Telehealth: Payer: Self-pay | Admitting: Family Medicine

## 2013-08-25 MED ORDER — PRAVASTATIN SODIUM 40 MG PO TABS: 40.0000 mg | ORAL_TABLET | Freq: Every day | ORAL | Status: DC

## 2013-08-25 NOTE — Telephone Encounter (Signed)
Med refilled.

## 2013-09-08 LAB — HM DIABETES EYE EXAM

## 2013-10-07 LAB — COMPLETE METABOLIC PANEL WITH GFR
ALK PHOS: 70 U/L (ref 39–117)
ALT: 16 U/L (ref 0–35)
AST: 19 U/L (ref 0–37)
Albumin: 4.1 g/dL (ref 3.5–5.2)
BUN: 15 mg/dL (ref 6–23)
CALCIUM: 9.3 mg/dL (ref 8.4–10.5)
CO2: 26 mEq/L (ref 19–32)
Chloride: 102 mEq/L (ref 96–112)
Creat: 0.77 mg/dL (ref 0.50–1.10)
GFR, EST NON AFRICAN AMERICAN: 80 mL/min
GFR, Est African American: >89 mL/min
GLUCOSE: 86 mg/dL (ref 70–99)
Potassium: 4.2 mEq/L (ref 3.5–5.3)
Sodium: 137 mEq/L (ref 135–145)
Total Bilirubin: 0.5 mg/dL (ref 0.2–1.2)
Total Protein: 6.8 g/dL (ref 6.0–8.3)

## 2013-10-07 LAB — LIPID PANEL
CHOLESTEROL: 137 mg/dL (ref 0–200)
HDL: 55 mg/dL (ref >39)
LDL Cholesterol: 70 mg/dL (ref 0–99)
TRIGLYCERIDES: 61 mg/dL (ref <150)
Total CHOL/HDL Ratio: 2.5 Ratio
VLDL: 12 mg/dL (ref 0–40)

## 2013-10-07 LAB — HEMOGLOBIN A1C
HEMOGLOBIN A1C: 7.2 % — AB (ref <5.7)
Mean Plasma Glucose: 160 mg/dL — ABNORMAL HIGH (ref <117)

## 2013-10-11 ENCOUNTER — Encounter: Payer: Self-pay | Admitting: Family Medicine

## 2013-10-11 ENCOUNTER — Ambulatory Visit (INDEPENDENT_AMBULATORY_CARE_PROVIDER_SITE_OTHER): Payer: Medicare Other | Admitting: Family Medicine

## 2013-10-11 ENCOUNTER — Encounter (INDEPENDENT_AMBULATORY_CARE_PROVIDER_SITE_OTHER): Payer: Self-pay

## 2013-10-11 VITALS — BP 122/80 | HR 89 | Resp 16 | Ht 68.0 in | Wt 205.0 lb

## 2013-10-11 DIAGNOSIS — E1065 Type 1 diabetes mellitus with hyperglycemia: Secondary | ICD-10-CM

## 2013-10-11 DIAGNOSIS — E1165 Type 2 diabetes mellitus with hyperglycemia: Secondary | ICD-10-CM

## 2013-10-11 DIAGNOSIS — I10 Essential (primary) hypertension: Secondary | ICD-10-CM

## 2013-10-11 DIAGNOSIS — Z794 Long term (current) use of insulin: Secondary | ICD-10-CM

## 2013-10-11 DIAGNOSIS — E785 Hyperlipidemia, unspecified: Secondary | ICD-10-CM

## 2013-10-11 DIAGNOSIS — IMO0001 Reserved for inherently not codable concepts without codable children: Secondary | ICD-10-CM

## 2013-10-11 DIAGNOSIS — Z23 Encounter for immunization: Secondary | ICD-10-CM

## 2013-10-11 DIAGNOSIS — IMO0002 Reserved for concepts with insufficient information to code with codable children: Secondary | ICD-10-CM

## 2013-10-11 NOTE — Patient Instructions (Signed)
Annual wellness in 4 month, call if you need me before  Prevnar today  Call in September for flu vaccine    CONGRATS, blood pressure, blood sugar and cholesterol are all at goal, keep it up  Continue to enjoy the [facility], and also really seriously consider joining the [employer] group  Non fastingf HBA1C, chem 7 and EGFr in December

## 2013-10-15 NOTE — Assessment & Plan Note (Signed)
Controlled, no change in medication Hyperlipidemia:Low fat diet discussed and encouraged.  \ 

## 2013-10-15 NOTE — Assessment & Plan Note (Signed)
Vaccine administered.

## 2013-10-15 NOTE — Assessment & Plan Note (Signed)
Improved and controlled, pt applauded on this Patient advised to reduce carb and sweets, commit to regular physical activity, take meds as prescribed, test blood as directed, and attempt to lose weight, to improve blood sugar control.

## 2013-10-15 NOTE — Progress Notes (Signed)
   Subjective:    Patient ID: Stacey Hanson, female    DOB: Mar 26, 1945, 69 y.o.   MRN: [id number]  HPI The PT is here for follow up and re-evaluation of chronic medical conditions, medication management and review of any available recent lab and radiology data.  Preventive health is updated, specifically  Cancer screening and Immunization.   Questions or concerns regarding consultations or procedures which the PT has had in the interim are  addressed. The PT denies any adverse reactions to current medications since the last visit.  There are no new concerns. Resolution of uncontrolled back pain There are no specific complaints   Denies hypoglycemic episodes, polyuria or polydipsia    Review of Systems See HPI Denies recent fever or chills. Denies sinus pressure, nasal congestion, ear pain or sore throat. Denies chest congestion, productive cough or wheezing. Denies chest pains, palpitations and leg swelling Denies abdominal pain, nausea, vomiting,diarrhea or constipation.   Denies dysuria, frequency, hesitancy or incontinence. Denies uncontrolled  joint pain, swelling and limitation in mobility. Denies headaches, seizures, numbness, or tingling. Denies depression, anxiety or insomnia. Denies skin break down or rash.        Objective:   Physical Exam BP 122/80  Pulse 89  Resp 16  Ht 5\' 8"  (1.727 m)  Wt 205 lb (92.987 kg)  BMI 31.18 kg/m2  SpO2 99% Patient alert and oriented and in no cardiopulmonary distress.  HEENT: No facial asymmetry, EOMI,   oropharynx pink and moist.  Neck supple no JVD, no mass.  Chest: Clear to auscultation bilaterally.  CVS: S1, S2 no murmurs, no S3.Regular rate.  ABD: Soft non tender.   Ext: No edema  MS: Adequate ROM spine, shoulders, hips and knees.  Skin: Intact, no ulcerations or rash noted.  Psych: Good eye contact, normal affect. Memory intact not anxious or depressed appearing.  CNS: CN 2-12 intact, power,  normal  throughout.no focal deficits noted.        Assessment & Plan:  ESSENTIAL HYPERTENSION, BENIGN Controlled, no change in medication DASH diet and commitment to daily physical activity for a minimum of 30 minutes discussed and encouraged, as a part of hypertension management. The importance of attaining a healthy weight is also discussed.   Diabetes mellitus, insulin dependent (IDDM), controlled Improved and controlled, pt applauded on this Patient advised to reduce carb and sweets, commit to regular physical activity, take meds as prescribed, test blood as directed, and attempt to lose weight, to improve blood sugar control.   HYPERLIPIDEMIA Controlled, no change in medication Hyperlipidemia:Low fat diet discussed and encouraged.    Need for vaccination with 13-polyvalent pneumococcal conjugate vaccine Vaccine administered

## 2013-10-15 NOTE — Assessment & Plan Note (Signed)
Controlled, no change in medication DASH diet and commitment to daily physical activity for a minimum of 30 minutes discussed and encouraged, as a part of hypertension management. The importance of attaining a healthy weight is also discussed.  

## 2013-11-07 ENCOUNTER — Other Ambulatory Visit (INDEPENDENT_AMBULATORY_CARE_PROVIDER_SITE_OTHER): Payer: Self-pay | Admitting: Otolaryngology

## 2013-11-07 DIAGNOSIS — R229 Localized swelling, mass and lump, unspecified: Principal | ICD-10-CM

## 2013-11-07 DIAGNOSIS — IMO0002 Reserved for concepts with insufficient information to code with codable children: Secondary | ICD-10-CM

## 2013-11-15 ENCOUNTER — Ambulatory Visit (INDEPENDENT_AMBULATORY_CARE_PROVIDER_SITE_OTHER): Payer: Medicare Other

## 2013-11-15 ENCOUNTER — Ambulatory Visit (HOSPITAL_COMMUNITY)
Admission: RE | Admit: 2013-11-15 | Discharge: 2013-11-15 | Disposition: A | Discharge status: Home / Self Care | Payer: Medicare Other | Source: Ambulatory Visit | Attending: Otolaryngology | Admitting: Otolaryngology

## 2013-11-15 DIAGNOSIS — Z23 Encounter for immunization: Secondary | ICD-10-CM

## 2013-11-15 DIAGNOSIS — R229 Localized swelling, mass and lump, unspecified: Secondary | ICD-10-CM

## 2013-11-15 DIAGNOSIS — E042 Nontoxic multinodular goiter: Secondary | ICD-10-CM | POA: Insufficient documentation

## 2013-11-15 DIAGNOSIS — R220 Localized swelling, mass and lump, head: Secondary | ICD-10-CM | POA: Insufficient documentation

## 2013-11-15 DIAGNOSIS — IMO0002 Reserved for concepts with insufficient information to code with codable children: Secondary | ICD-10-CM

## 2013-11-15 DIAGNOSIS — R221 Localized swelling, mass and lump, neck: Secondary | ICD-10-CM

## 2013-11-23 ENCOUNTER — Ambulatory Visit (INDEPENDENT_AMBULATORY_CARE_PROVIDER_SITE_OTHER): Payer: Medicare Other | Admitting: Otolaryngology

## 2013-11-23 DIAGNOSIS — D449 Neoplasm of uncertain behavior of unspecified endocrine gland: Secondary | ICD-10-CM

## 2013-12-14 ENCOUNTER — Other Ambulatory Visit: Payer: Self-pay

## 2013-12-14 MED ORDER — INSULIN ASPART PROT & ASPART (70-30 MIX) 100 UNIT/ML PEN: PEN_INJECTOR | SUBCUTANEOUS | Status: DC

## 2013-12-21 ENCOUNTER — Ambulatory Visit: Payer: Medicare Other | Admitting: Orthopedic Surgery

## 2013-12-28 ENCOUNTER — Ambulatory Visit (INDEPENDENT_AMBULATORY_CARE_PROVIDER_SITE_OTHER): Payer: Medicare Other | Admitting: Orthopedic Surgery

## 2013-12-28 ENCOUNTER — Ambulatory Visit (INDEPENDENT_AMBULATORY_CARE_PROVIDER_SITE_OTHER): Payer: Medicare Other

## 2013-12-28 VITALS — BP 126/73 | Ht 68.0 in | Wt 205.0 lb

## 2013-12-28 DIAGNOSIS — M1711 Unilateral primary osteoarthritis, right knee: Secondary | ICD-10-CM

## 2013-12-28 DIAGNOSIS — M129 Arthropathy, unspecified: Secondary | ICD-10-CM

## 2013-12-28 NOTE — Progress Notes (Signed)
Patient ID: Stacey Hanson, female   DOB: 08-12-1945, 68 y.o.   MRN: [id number] Chief Complaint  Patient presents with  . Follow-up    yearly recheck and xray Right knee    BP 126/73  Ht 5\' 8"  (1.727 m)  Wt 205 lb (92.987 kg)  BMI 31.18 kg/m2  The patient has no complaints  She's here for follow-up on her right knee for arthritis  X-rays do today  She says she is doing fine no problems  Review of systems negative  Her vital signs are stable she is alert awake and oriented to person place and time mood and affect are normal she walks without assistive device she has some valgus alignment to the knee her knee flexion now is only 90 she has a 5 flexion contracture it is stable and is nontender with no effusion  X-ray interpretation osteoarthritis severe right knee  Recommend six-month follow-up x-ray  Diagnosis osteoarthritis

## 2013-12-30 ENCOUNTER — Other Ambulatory Visit: Payer: Self-pay | Admitting: Family Medicine

## 2014-01-02 ENCOUNTER — Telehealth: Payer: Self-pay | Admitting: Family Medicine

## 2014-01-02 NOTE — Telephone Encounter (Signed)
pls contact pt, make her aware that as of Jan 2016, due to insurance change instead of novolog she will be on humalog mix 75/25 pen same dose 40 units twice daily, pls enter and print script for change to start in Jan and have her collect also

## 2014-01-03 ENCOUNTER — Other Ambulatory Visit: Payer: Self-pay

## 2014-01-03 MED ORDER — INSULIN LISPRO PROT & LISPRO (75-25 MIX) 100 UNIT/ML KWIKPEN: 40.0000 [IU] | PEN_INJECTOR | Freq: Two times a day (BID) | SUBCUTANEOUS | Status: DC

## 2014-01-03 NOTE — Telephone Encounter (Signed)
Pt aware- mailed rx to her and she knows to start in Jan 2016

## 2014-01-12 LAB — HEMOGLOBIN A1C: A1C: 8.5

## 2014-01-13 ENCOUNTER — Other Ambulatory Visit: Payer: Self-pay | Admitting: Gastroenterology

## 2014-01-16 ENCOUNTER — Telehealth: Payer: Self-pay | Admitting: *Deleted

## 2014-01-16 NOTE — Telephone Encounter (Signed)
Called patient and there was no option to leave message

## 2014-01-16 NOTE — Telephone Encounter (Signed)
Pt called stating she needs her refill on her diabetic supply sent to [facility] in [place]. Please advise

## 2014-01-17 ENCOUNTER — Other Ambulatory Visit: Payer: Self-pay

## 2014-01-17 MED ORDER — ONETOUCH DELICA LANCETS 33G MISC: Status: DC

## 2014-01-17 MED ORDER — GLUCOSE BLOOD VI STRP: ORAL_STRIP | Status: DC

## 2014-01-17 NOTE — Telephone Encounter (Signed)
Sent and pt aware  

## 2014-02-01 ENCOUNTER — Ambulatory Visit (INDEPENDENT_AMBULATORY_CARE_PROVIDER_SITE_OTHER): Payer: Medicare Other | Admitting: Gastroenterology

## 2014-02-01 ENCOUNTER — Encounter: Payer: Self-pay | Admitting: Gastroenterology

## 2014-02-01 VITALS — BP 141/87 | HR 106 | Temp 98.2°F | Ht 69.0 in | Wt 205.4 lb

## 2014-02-01 DIAGNOSIS — K219 Gastro-esophageal reflux disease without esophagitis: Secondary | ICD-10-CM

## 2014-02-01 DIAGNOSIS — R1310 Dysphagia, unspecified: Secondary | ICD-10-CM

## 2014-02-01 DIAGNOSIS — Z1211 Encounter for screening for malignant neoplasm of colon: Secondary | ICD-10-CM

## 2014-02-01 MED ORDER — OMEPRAZOLE 20 MG PO CPDR: 20.0000 mg | DELAYED_RELEASE_CAPSULE | Freq: Every morning | ORAL | Status: DC

## 2014-02-01 NOTE — Assessment & Plan Note (Signed)
NEXT TCS 2019

## 2014-02-01 NOTE — Assessment & Plan Note (Addendum)
SX CONTROLLED.  LOW FAT DIET CONTINUE OMEPRAZOLE.  TAKE 30 MINUTES PRIOR TO YOUR MEAL DAILY. FOLLOW UP IN 1 YEAR.

## 2014-02-01 NOTE — Assessment & Plan Note (Signed)
SX CONTROLLED.  CONTINUE TO MONITOR SYMPTOMS.  

## 2014-02-01 NOTE — Patient Instructions (Signed)
DRINK WATER TO KEEP YOUR URINE LIGHT YELLOW.  FOLLOW A HIGH FIBER/LOW FAT DIET. SEE INFO BELOW.  CONTINUE OMEPRAZOLE.  TAKE 30 MINUTES PRIOR TO YOUR FIST MEAL.  FOLLOW UP IN 1 YEAR.   Low-Fat Diet BREADS, CEREALS, PASTA, RICE, DRIED PEAS, AND BEANS These products are high in carbohydrates and most are low in fat. Therefore, they can be increased in the diet as substitutes for fatty foods. They too, however, contain calories and should not be eaten in excess. Cereals can be eaten for snacks as well as for breakfast.   FRUITS AND VEGETABLES It is good to eat fruits and vegetables. Besides being sources of fiber, both are rich in vitamins and some minerals. They help you get the daily allowances of these nutrients. Fruits and vegetables can be used for snacks and desserts.  MEATS Limit lean meat, chicken, [place], and fish to no more than 6 ounces per day. Beef, Pork, and Lamb Use lean cuts of beef, pork, and lamb. Lean cuts include:  Extra-lean ground beef.  Arm roast.  Sirloin tip.  Center-cut ham.  Round steak.  Loin chops.  Rump roast.  Tenderloin.  Trim all fat off the outside of meats before cooking. It is not necessary to severely decrease the intake of red meat, but lean choices should be made. Lean meat is rich in protein and contains a highly absorbable form of iron. Premenopausal women, in particular, should avoid reducing lean red meat because this could increase the risk for low red blood cells (iron-deficiency anemia).  Chicken and [place] These are good sources of protein. The fat of poultry can be reduced by removing the skin and underlying fat layers before cooking. Chicken and [place] can be substituted for lean red meat in the diet. Poultry should not be fried or covered with high-fat sauces. Fish and Shellfish Fish is a good source of protein. Shellfish contain cholesterol, but they usually are low in saturated fatty acids. The preparation of fish is important. Like  chicken and [place], they should not be fried or covered with high-fat sauces. EGGS Egg whites contain no fat or cholesterol. They can be eaten often. Try 1 to 2 egg whites instead of whole eggs in recipes or use egg substitutes that do not contain yolk. MILK AND DAIRY PRODUCTS Use skim or 1% milk instead of 2% or whole milk. Decrease whole milk, natural, and processed cheeses. Use nonfat or low-fat (2%) cottage cheese or low-fat cheeses made from vegetable oils. Choose nonfat or low-fat (1 to 2%) yogurt. Experiment with evaporated skim milk in recipes that call for heavy cream. Substitute low-fat yogurt or low-fat cottage cheese for sour cream in dips and salad dressings. Have at least 2 servings of low-fat dairy products, such as 2 glasses of skim (or 1%) milk each day to help get your daily calcium intake. FATS AND OILS Reduce the total intake of fats, especially saturated fat. Butterfat, lard, and beef fats are high in saturated fat and cholesterol. These should be avoided as much as possible. Vegetable fats do not contain cholesterol, but certain vegetable fats, such as coconut oil, palm oil, and palm kernel oil are very high in saturated fats. These should be limited. These fats are often used in bakery goods, processed foods, popcorn, oils, and nondairy creamers. Vegetable shortenings and some peanut butters contain hydrogenated oils, which are also saturated fats. Read the labels on these foods and check for saturated vegetable oils. Unsaturated vegetable oils and fats do not raise  blood cholesterol. However, they should be limited because they are fats and are high in calories. Total fat should still be limited to 30% of your daily caloric intake. Desirable liquid vegetable oils are corn oil, cottonseed oil, olive oil, canola oil, safflower oil, soybean oil, and sunflower oil. Peanut oil is not as good, but small amounts are acceptable. Buy a heart-healthy tub margarine that has no partially  hydrogenated oils in the ingredients. Mayonnaise and salad dressings often are made from unsaturated fats, but they should also be limited because of their high calorie and fat content. Seeds, nuts, peanut butter, olives, and avocados are high in fat, but the fat is mainly the unsaturated type. These foods should be limited mainly to avoid excess calories and fat. OTHER EATING TIPS Snacks  Most sweets should be limited as snacks. They tend to be rich in calories and fats, and their caloric content outweighs their nutritional value. Some good choices in snacks are graham crackers, melba toast, soda crackers, bagels (no egg), English muffins, fruits, and vegetables. These snacks are preferable to snack crackers, [place] fries, TORTILLA CHIPS, and POTATO chips. Popcorn should be air-popped or cooked in small amounts of liquid vegetable oil. Desserts Eat fruit, low-fat yogurt, and fruit ices instead of pastries, cake, and cookies. Sherbet, angel food cake, gelatin dessert, frozen low-fat yogurt, or other frozen products that do not contain saturated fat (pure fruit juice bars, frozen ice pops) are also acceptable.  COOKING METHODS Choose those methods that use little or no fat. They include: Poaching.  Braising.  Steaming.  Grilling.  Baking.  Stir-frying.  Broiling.  Microwaving.  Foods can be cooked in a nonstick pan without added fat, or use a nonfat cooking spray in regular cookware. Limit fried foods and avoid frying in saturated fat. Add moisture to lean meats by using water, broth, cooking wines, and other nonfat or low-fat sauces along with the cooking methods mentioned above. Soups and stews should be chilled after cooking. The fat that forms on top after a few hours in the refrigerator should be skimmed off. When preparing meals, avoid using excess salt. Salt can contribute to raising blood pressure in some people.  EATING AWAY FROM HOME Order entres, potatoes, and vegetables without  sauces or butter. When meat exceeds the size of a deck of cards (3 to 4 ounces), the rest can be taken home for another meal. Choose vegetable or fruit salads and ask for low-calorie salad dressings to be served on the side. Use dressings sparingly. Limit high-fat toppings, such as bacon, crumbled eggs, cheese, sunflower seeds, and olives. Ask for heart-healthy tub margarine instead of butter.  High-Fiber Diet A high-fiber diet changes your normal diet to include more whole grains, legumes, fruits, and vegetables. Changes in the diet involve replacing refined carbohydrates with unrefined foods. The calorie level of the diet is essentially unchanged. The Dietary Reference Intake (recommended amount) for adult males is 38 grams per day. For adult females, it is 25 grams per day. Pregnant and lactating women should consume 28 grams of fiber per day. Fiber is the intact part of a plant that is not broken down during digestion. Functional fiber is fiber that has been isolated from the plant to provide a beneficial effect in the body. PURPOSE  Increase stool bulk.   Ease and regulate bowel movements.   Lower cholesterol.  INDICATIONS THAT YOU NEED MORE FIBER  Constipation and hemorrhoids.   Uncomplicated diverticulosis (intestine condition) and irritable bowel syndrome.  Weight management.   As a protective measure against hardening of the arteries (atherosclerosis), diabetes, and cancer.   GUIDELINES FOR INCREASING FIBER IN THE DIET  Start adding fiber to the diet slowly. A gradual increase of about 5 more grams (2 slices of whole-wheat bread, 2 servings of most fruits or vegetables, or 1 bowl of high-fiber cereal) per day is best. Too rapid an increase in fiber may result in constipation, flatulence, and bloating.   Drink enough water and fluids to keep your urine clear or pale yellow. Water, juice, or caffeine-free drinks are recommended. Not drinking enough fluid may cause constipation.    Eat a variety of high-fiber foods rather than one type of fiber.   Try to increase your intake of fiber through using high-fiber foods rather than fiber pills or supplements that contain small amounts of fiber.   The goal is to change the types of food eaten. Do not supplement your present diet with high-fiber foods, but replace foods in your present diet.  INCLUDE A VARIETY OF FIBER SOURCES  Replace refined and processed grains with whole grains, canned fruits with fresh fruits, and incorporate other fiber sources. White rice, white breads, and most bakery goods contain little or no fiber.   Brown whole-grain rice, buckwheat oats, and many fruits and vegetables are all good sources of fiber. These include: broccoli, Brussels sprouts, cabbage, cauliflower, beets, sweet potatoes, white potatoes (skin on), carrots, tomatoes, eggplant, squash, berries, fresh fruits, and dried fruits.   Cereals appear to be the richest source of fiber. Cereal fiber is found in whole grains and bran. Bran is the fiber-rich outer coat of cereal grain, which is largely removed in refining. In whole-grain cereals, the bran remains. In breakfast cereals, the largest amount of fiber is found in those with "bran" in their names. The fiber content is sometimes indicated on the label.   You may need to include additional fruits and vegetables each day.   In baking, for 1 cup white flour, you may use the following substitutions:   1 cup whole-wheat flour minus 2 tablespoons.   1/2 cup white flour plus 1/2 cup whole-wheat flour.

## 2014-02-01 NOTE — Progress Notes (Signed)
PATIENT ON RECALL LIST  °

## 2014-02-01 NOTE — Progress Notes (Signed)
cc'ed to pcp °

## 2014-02-01 NOTE — Progress Notes (Signed)
Subjective:    Patient ID: Stacey Hanson, female    DOB: 03/25/1945, 68 y.o.   MRN: [id number]  Tula Nakayama, MD  HPI BMs: DAILY. No questions or concerns. DIARRHEA CAN BE TRIGGERED BY CERTAIN FOODS. GAINED 5 LBS SINCE DEC 2014.   PT DENIES FEVER, CHILLS, HEMATOCHEZIA, HEMATEMESIS, nausea, vomiting, melena, diarrhea, CHEST PAIN, SHORTNESS OF BREATH,  constipation, abdominal pain, problems swallowing, OR heartburn or indigestion.  Past Medical History  Diagnosis Date  . 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 Knee pain   . Obesity   . Hyperlipidemia   . Hypertension   . IDDM (insulin dependent diabetes mellitus) 1987  . Diabetes mellitus   . BMI 30.0-30.9,adult 2012 215 LBS    2004 198 LBS  . H. pylori infection 09/24/2010   Past Surgical History  Procedure Laterality Date  . Abdominal hysterectomy  1970  . Cholecystectomy  OCT 2010 BZ BILIARY DYSKINESIA    CHRONIC CHOLECYSTITIS  . Colonoscopy  MAY 2009 SCREENING    pTC TICS, SML IH  . Total abdominal hysterectomy  1970  . Upper gastrointestinal endoscopy  OCT 2004 RMR    DUO TIC, & ULCER  . Esophagogastroduodenoscopy  10/03/2010    Procedure: ESOPHAGOGASTRODUODENOSCOPY (EGD);  Surgeon: Dorothyann Peng, MD;  Location: [facility];  Service: Endoscopy;  Laterality: N/A;  . Savory dilation  10/03/2010    Procedure: SAVORY DILATION;  Surgeon: Dorothyann Peng, MD;  Location: [facility];  Service: Endoscopy;;  . Esophagogastroduodenoscopy  12/19/2002    RMR: Normal esophagus/Normal stomach/ Duodenum large bulbar diverticulum, 1 cm bulbar ulcer with surrounding/ inflammation as described above. Normal D2   Allergies  Allergen Reactions  . Simvastatin     REACTION: muscle cramps   Current Outpatient Prescriptions  Medication Sig Dispense Refill  . benazepril (LOTENSIN) 40 MG tablet Take 1 tablet (40 mg total) by mouth daily.    . Blood Glucose Monitoring Suppl ([place]) KIT by Does not  apply route 2 (two) times daily. Two times a day testing     . Calcium Carbonate-Vitamin D (CALCIUM PLUS VITAMIN D PO) Take by mouth 2 (two) times daily. Take two tablets daily     . glucose blood (ONE TOUCH ULTRA TEST) test strip Use as instructed twice daily dx 250.01    . glucose blood (TRUETRACK TEST) test strip 1 each by Other route 2 (two) times daily. Two times a day     . hydrochlorothiazide (HYDRODIURIL) 25 MG tablet Take 1 tablet (25 mg total) by mouth daily.    . Insulin Aspart Prot & Aspart (NOVOLOG MIX 70/30 FLEXPEN) (70-30) 100 UNIT/ML Pen INJECT 40 UNITS INTO THE SKIN TWICE DAILY WITH A MEAL    . Insulin Pen Needle (BD ULTRA-FINE PEN NEEDLES) 29G X 12.7MM MISC USE AS DIRECTED    . levothyroxine (SYNTHROID) 100 MCG tablet Take 100 mcg by mouth daily.    . metFORMIN (GLUCOPHAGE) 1000 MG tablet TAKE 1 TABLET BY MOUTH TWICE DAILY WITH A MEAL    . Multiple Vitamin (MULTIVITAMIN) capsule Take 1 capsule by mouth daily.      Marland Kitchen omeprazole (PRILOSEC) 20 MG capsule TAKE 1 CAPSULE BY MOUTH EVERY MORNING    . ONETOUCH DELICA LANCETS 32Z MISC Use as directed twice daily dx 250.01jscript:void(0)    . pravastatin (PRAVACHOL) 40 MG tablet Take 1 tablet (40 mg total) by mouth daily.    Marland Kitchen      Marland Kitchen  Review of Systems     Objective:   Physical Exam  Constitutional: She is oriented to person, place, and time. She appears well-developed and well-nourished. No distress.  HENT:  Head: Normocephalic and atraumatic.  Mouth/Throat: Oropharynx is clear and moist. No oropharyngeal exudate.  Eyes: Pupils are equal, round, and reactive to light. No scleral icterus.  Neck: Normal range of motion. Neck supple.  Cardiovascular: Normal rate, regular rhythm and normal heart sounds.   Pulmonary/Chest: Effort normal and breath sounds normal. No respiratory distress.  Abdominal: Soft. Bowel sounds are normal. She exhibits no distension. There is no tenderness.  Musculoskeletal: She exhibits no edema.    Lymphadenopathy:    She has no cervical adenopathy.  Neurological: She is alert and oriented to person, place, and time.  NO FOCAL DEFICITS   Psychiatric: She has a normal mood and affect.  Vitals reviewed.         Assessment & Plan:

## 2014-02-15 LAB — COMPLETE METABOLIC PANEL WITH GFR
ALT: 21 U/L (ref 0–35)
AST: 27 U/L (ref 0–37)
Albumin: 4.4 g/dL (ref 3.5–5.2)
Alkaline Phosphatase: 78 U/L (ref 39–117)
BUN: 21 mg/dL (ref 6–23)
CO2: 25 mEq/L (ref 19–32)
Calcium: 9.9 mg/dL (ref 8.4–10.5)
Chloride: 99 mEq/L (ref 96–112)
Creat: 0.98 mg/dL (ref 0.50–1.10)
GFR, Est African American: 69 mL/min
GFR, Est Non African American: 59 mL/min — ABNORMAL LOW
Glucose, Bld: 131 mg/dL — ABNORMAL HIGH (ref 70–99)
POTASSIUM: 4.9 meq/L (ref 3.5–5.3)
SODIUM: 134 meq/L — AB (ref 135–145)
TOTAL PROTEIN: 7.8 g/dL (ref 6.0–8.3)
Total Bilirubin: 0.5 mg/dL (ref 0.2–1.2)

## 2014-02-15 LAB — HEMOGLOBIN A1C
HEMOGLOBIN A1C: 7.6 % — AB (ref <5.7)
Mean Plasma Glucose: 171 mg/dL — ABNORMAL HIGH (ref <117)

## 2014-02-19 ENCOUNTER — Ambulatory Visit (INDEPENDENT_AMBULATORY_CARE_PROVIDER_SITE_OTHER): Payer: Medicare Other | Admitting: Family Medicine

## 2014-02-19 ENCOUNTER — Encounter: Payer: Self-pay | Admitting: Family Medicine

## 2014-02-19 VITALS — BP 130/78 | HR 88 | Resp 16 | Ht 69.0 in | Wt 204.0 lb

## 2014-02-19 DIAGNOSIS — I10 Essential (primary) hypertension: Secondary | ICD-10-CM

## 2014-02-19 DIAGNOSIS — E119 Type 2 diabetes mellitus without complications: Secondary | ICD-10-CM

## 2014-02-19 DIAGNOSIS — IMO0001 Reserved for inherently not codable concepts without codable children: Secondary | ICD-10-CM

## 2014-02-19 DIAGNOSIS — Z794 Long term (current) use of insulin: Secondary | ICD-10-CM

## 2014-02-19 DIAGNOSIS — Z0000 Encounter for general adult medical examination without abnormal findings: Secondary | ICD-10-CM | POA: Insufficient documentation

## 2014-02-19 DIAGNOSIS — E785 Hyperlipidemia, unspecified: Secondary | ICD-10-CM

## 2014-02-19 NOTE — Patient Instructions (Addendum)
F/u in 3 month, call if you need me before  Blood sugar is too high, cut back on snacks and keep active  Morning sugar before breakfast 90 to 130, and before dinner, 130 to 160  Fasting lipid, cmp and EGFr, and hBa1C in 3 month  Microalb today from office  Discuss end of life and living will with your sons as discussed  Pls sign for eye exam

## 2014-02-19 NOTE — Progress Notes (Signed)
Subjective:    Patient ID: Stacey Hanson, female    DOB: 11/28/1945, 68 y.o.   MRN: [id number]  HPI Preventive Screening-Counseling & Management   Patient present here today for a Medicare annual wellness visit.   Current Problems (verified)   Medications Prior to Visit Allergies (verified)   PAST HISTORY  Family History -Verified   Social History never been married, 3 children, lives alone   Risk Factors  Current exercise habits: goes to the [facility] when able 2-4 days a week   Dietary issues discussed: Heart healthy diet discussed, importance of limiting fried fatty foods/red meat and eating more fruits and vegetables   Cardiac risk factors: mother heart failure age 88 and 2 brothers had MI at ge 55 and 64  Depression Screen  (Note: if answer to either of the following is "Yes", a more complete depression screening is indicated)   Over the past two weeks, have you felt down, depressed or hopeless? Some stress with the holidays   Over the past two weeks, have you felt little interest or pleasure in doing things? No  Have you lost interest or pleasure in daily life? No  Do you often feel hopeless? No  Do you cry easily over simple problems? No   Activities of Daily Living  In your present state of health, do you have any difficulty performing the following activities?  Driving?: No Managing money?: No Feeding yourself?:No Getting from bed to chair?:No Climbing a flight of stairs?:No Preparing food and eating?:No Bathing or showering?:No Getting dressed?:No Getting to the toilet?:No Using the toilet?:No Moving around from place to place?: No  Fall Risk Assessment In the past year have you fallen or had a near fall?:No Are you currently taking any medications that make you dizzy:No   Hearing Difficulties: No Do you often ask people to speak up or repeat themselves?:No Do you experience ringing or noises in your ears?: rarely  Do you have difficulty  understanding soft or whispered voices?:No  Cognitive Testing  Alert? Yes Normal Appearance?Yes  Oriented to person? Yes Place? Yes  Time? Yes  Displays appropriate judgment?Yes  Can read the correct time from a watch face? yes Are you having problems remembering things?No  Advanced Directives have been discussed with the patient?Yes , full code    List the Names of Other Physician/Practitioners you currently use:  Teoh (ent) Fields (gi) Harrison (ortho) Nida (endo)    Indicate any recent Medical Services you may have received from other than [facility] providers in the past year (date may be approximate).   Assessment:    Annual Wellness Exam   Plan:     Medicare Attestation  I have personally reviewed:  The patient's medical and social history  Their use of alcohol, tobacco or illicit drugs  Their current medications and supplements  The patient's functional ability including ADLs,fall risks, home safety risks, cognitive, and hearing and visual impairment  Diet and physical activities  Evidence for depression or mood disorders  The patient's weight, height, BMI, and visual acuity have been recorded in the chart. I have made referrals, counseling, and provided education to the patient based on review of the above and I have provided the patient with a written personalized care plan for preventive services.      Review of Systems     Objective:   Physical Exam  BP 130/78 mmHg  Pulse 88  Resp 16  Ht 5\' 9"  (1.753 m)  Wt 204 lb (92.534 kg)  BMI 30.11 kg/m2  SpO2 99%       Assessment & Plan:  Medicare annual wellness visit, subsequent Annual exam as documented. Counseling done  re healthy lifestyle involving commitment to 150 minutes exercise per week, heart healthy diet, and attaining healthy weight.The importance of adequate sleep also discussed. Regular seat belt use and home safety, is also discussed. Changes in health habits are decided on by the patient  with goals and time frames  set for achieving them. Immunization and cancer screening needs are specifically addressed at this visit.

## 2014-02-19 NOTE — Assessment & Plan Note (Signed)

## 2014-02-20 LAB — MICROALBUMIN / CREATININE URINE RATIO
Creatinine, Urine: 52.6 mg/dL
MICROALB/CREAT RATIO: 3.8 mg/g (ref 0.0–30.0)
Microalb, Ur: 0.2 mg/dL (ref <2.0)

## 2014-02-21 ENCOUNTER — Other Ambulatory Visit: Payer: Self-pay | Admitting: Family Medicine

## 2014-02-21 ENCOUNTER — Other Ambulatory Visit: Payer: Self-pay

## 2014-02-21 MED ORDER — INSULIN LISPRO PROT & LISPRO (75-25 MIX) 100 UNIT/ML KWIKPEN: 40.0000 [IU] | PEN_INJECTOR | Freq: Two times a day (BID) | SUBCUTANEOUS | Status: DC

## 2014-02-22 ENCOUNTER — Telehealth: Payer: Self-pay | Admitting: Family Medicine

## 2014-02-22 NOTE — Telephone Encounter (Signed)
Med sent yesterday

## 2014-02-27 ENCOUNTER — Other Ambulatory Visit: Payer: Self-pay | Admitting: Family Medicine

## 2014-03-30 ENCOUNTER — Other Ambulatory Visit: Payer: Self-pay | Admitting: Family Medicine

## 2014-05-18 DIAGNOSIS — E109 Type 1 diabetes mellitus without complications: Secondary | ICD-10-CM | POA: Diagnosis not present

## 2014-05-18 DIAGNOSIS — E785 Hyperlipidemia, unspecified: Secondary | ICD-10-CM | POA: Diagnosis not present

## 2014-05-19 LAB — COMPLETE METABOLIC PANEL WITH GFR
ALT: 24 U/L (ref 0–35)
AST: 30 U/L (ref 0–37)
Albumin: 4.3 g/dL (ref 3.5–5.2)
Alkaline Phosphatase: 75 U/L (ref 39–117)
BUN: 14 mg/dL (ref 6–23)
CO2: 26 mEq/L (ref 19–32)
CREATININE: 0.91 mg/dL (ref 0.50–1.10)
Calcium: 9.7 mg/dL (ref 8.4–10.5)
Chloride: 101 mEq/L (ref 96–112)
GFR, Est African American: 75 mL/min
GFR, Est Non African American: 65 mL/min
Glucose, Bld: 91 mg/dL (ref 70–99)
Potassium: 4.4 mEq/L (ref 3.5–5.3)
Sodium: 135 mEq/L (ref 135–145)
TOTAL PROTEIN: 7.2 g/dL (ref 6.0–8.3)
Total Bilirubin: 0.4 mg/dL (ref 0.2–1.2)

## 2014-05-19 LAB — LIPID PANEL
Cholesterol: 145 mg/dL (ref 0–200)
HDL: 76 mg/dL (ref >=46)
LDL Cholesterol: 60 mg/dL (ref 0–99)
Total CHOL/HDL Ratio: 1.9 Ratio
Triglycerides: 47 mg/dL (ref <150)
VLDL: 9 mg/dL (ref 0–40)

## 2014-05-19 LAB — HEMOGLOBIN A1C
Hgb A1c MFr Bld: 6.9 % — ABNORMAL HIGH (ref <5.7)
MEAN PLASMA GLUCOSE: 151 mg/dL — AB (ref <117)

## 2014-05-22 ENCOUNTER — Ambulatory Visit (INDEPENDENT_AMBULATORY_CARE_PROVIDER_SITE_OTHER): Payer: Medicare Other | Admitting: Family Medicine

## 2014-05-22 ENCOUNTER — Encounter: Payer: Self-pay | Admitting: Family Medicine

## 2014-05-22 VITALS — BP 136/74 | HR 96 | Resp 18 | Ht 69.0 in | Wt 208.0 lb

## 2014-05-22 DIAGNOSIS — I10 Essential (primary) hypertension: Secondary | ICD-10-CM | POA: Diagnosis not present

## 2014-05-22 DIAGNOSIS — IMO0001 Reserved for inherently not codable concepts without codable children: Secondary | ICD-10-CM

## 2014-05-22 DIAGNOSIS — E119 Type 2 diabetes mellitus without complications: Principal | ICD-10-CM

## 2014-05-22 DIAGNOSIS — Z794 Long term (current) use of insulin: Principal | ICD-10-CM

## 2014-05-22 DIAGNOSIS — E669 Obesity, unspecified: Secondary | ICD-10-CM

## 2014-05-22 DIAGNOSIS — E890 Postprocedural hypothyroidism: Secondary | ICD-10-CM

## 2014-05-22 DIAGNOSIS — E109 Type 1 diabetes mellitus without complications: Secondary | ICD-10-CM | POA: Diagnosis not present

## 2014-05-22 DIAGNOSIS — E785 Hyperlipidemia, unspecified: Secondary | ICD-10-CM

## 2014-05-22 NOTE — Patient Instructions (Signed)
Annual physical in 4 months, call if you need me before  CONGRATS, excellent labs  Work on portion control to help with 4 pound weight loss , Blood pressure needs to be watched closely   Drink water mainly before 5 pm so you do not have to get up all night  Non fasting  Chem 7 and EGFR, HBA1C, CBC  It is important that you exercise regularly at least 30 minutes 5 times a week. If you develop chest pain, have severe difficulty breathing, or feel very tired, stop exercising immediately and seek medical attention    A healthy diet is rich in fruit, vegetables and whole grains. Poultry fish, nuts and beans are a healthy choice for protein rather then red meat. A low sodium diet and drinking 64 ounces of water daily is generally recommended. Oils and sweet should be limited. Carbohydrates especially for those who are diabetic or overweight, should be limited to 60-45 gram per meal. It is important to eat on a regular schedule, at least 3 times daily. Snacks should be primarily fruits, vegetables or nuts.

## 2014-05-22 NOTE — Progress Notes (Signed)
Stacey Hanson     MRN: [id number]      DOB: June 17, 1945   HPI [name] is here for follow up and re-evaluation of chronic medical conditions, medication management and review of any available recent lab and radiology data.  Preventive health is updated, specifically  Cancer screening and Immunization.   Questions or concerns regarding consultations or procedures which the PT has had in the interim are  addressed. The PT denies any adverse reactions to current medications since the last visit.  There are no new concerns.  C/o nocturia, denies burning, or pain, but does tend to drink a fair amt of water  After 7pm  Denies polyuria, polydipsia, blurred vision , or hypoglycemic episodes. Tests twice daily, morning blood sugars generally around 140,  Bedtime around 170 Exercises 45 mins 4 days per week  ROS Denies recent fever or chills. Denies sinus pressure, nasal congestion, ear pain or sore throat. Denies chest congestion, productive cough or wheezing. Denies chest pains, palpitations and leg swelling Denies abdominal pain, nausea, vomiting,diarrhea or constipation.   Denies dysuria, frequency, hesitancy or incontinence. Denies joint pain, swelling and limitation in mobility. Denies headaches, seizures, numbness, or tingling. Denies depression, anxiety or insomnia. Denies skin break down or rash.   PE  BP 136/74 mmHg  Pulse 96  Resp 18  Ht 5\' 9"  (1.753 m)  Wt 208 lb (94.348 kg)  BMI 30.70 kg/m2  SpO2 98%  Patient alert and oriented and in no cardiopulmonary distress.  HEENT: No facial asymmetry, EOMI,   oropharynx pink and moist.  Neck supple no JVD, no mass.  Chest: Clear to auscultation bilaterally.  CVS: S1, S2 no murmurs, no S3.Regular rate.  ABD: Soft non tender.   Ext: No edema  MS: Adequate ROM spine, shoulders, hips and knees.  Skin: Intact, no ulcerations or rash noted.  Psych: Good eye contact, normal affect. Memory intact not anxious or depressed  appearing.  CNS: CN 2-12 intact, power,  normal throughout.no focal deficits noted.   Assessment & Plan   ESSENTIAL HYPERTENSION, BENIGN Controlled, no change in medication DASH diet and commitment to daily physical activity for a minimum of 30 minutes discussed and encouraged, as a part of hypertension management. The importance of attaining a healthy weight is also discussed.  BP/Weight 05/22/2014 02/19/2014 02/01/2014 12/28/2013 10/11/2013 4/68/0321 04/04/4823  Systolic BP 003 704 888 916 945 038 882  Diastolic BP 74 78 87 73 80 80 78  Wt. (Lbs) 208 204 205.4 205 205 205 205  BMI 30.7 30.11 30.32 31.18 31.18 31.18 31.18         Hyperlipidemia LDL goal <100 Controlled, no change in medication Hyperlipidemia:Low fat diet discussed and encouraged.   Lipid Panel  Lab Results  Component Value Date   CHOL 145 05/18/2014   HDL 76 05/18/2014   LDLCALC 60 05/18/2014   TRIG 47 05/18/2014   CHOLHDL 1.9 05/18/2014         Obesity Deteriorated. Patient re-educated about  the importance of commitment to a  minimum of 150 minutes of exercise per week.  The importance of healthy food choices with portion control discussed. Encouraged to start a food diary, count calories and to consider  joining a support group. Sample diet sheets offered. Goals set by the patient for the next several months.   Weight /BMI 05/22/2014 02/19/2014 02/01/2014  WEIGHT 208 lb 204 lb 205 lb 6.4 oz  HEIGHT 5\' 9"  5\' 9"  5\' 9"   BMI 30.7 kg/m2 30.11  kg/m2 30.32 kg/m2    Current exercise per week 150 minutes.    Hypothyroidism, postradioiodine therapy Controlled and followed by endo

## 2014-05-23 ENCOUNTER — Other Ambulatory Visit: Payer: Self-pay | Admitting: Family Medicine

## 2014-05-23 DIAGNOSIS — Z1231 Encounter for screening mammogram for malignant neoplasm of breast: Secondary | ICD-10-CM

## 2014-06-28 ENCOUNTER — Telehealth: Payer: Self-pay | Admitting: Family Medicine

## 2014-06-28 MED ORDER — METFORMIN HCL 1000 MG PO TABS: ORAL_TABLET | ORAL | Status: DC

## 2014-06-28 NOTE — Telephone Encounter (Signed)
Med sent.

## 2014-07-01 NOTE — Assessment & Plan Note (Signed)
Deteriorated. Patient re-educated about  the importance of commitment to a  minimum of 150 minutes of exercise per week.  The importance of healthy food choices with portion control discussed. Encouraged to start a food diary, count calories and to consider  joining a support group. Sample diet sheets offered. Goals set by the patient for the next several months.   Weight /BMI 05/22/2014 02/19/2014 02/01/2014  WEIGHT 208 lb 204 lb 205 lb 6.4 oz  HEIGHT 5\' 9"  5\' 9"  5\' 9"   BMI 30.7 kg/m2 30.11 kg/m2 30.32 kg/m2    Current exercise per week 150 minutes.

## 2014-07-01 NOTE — Assessment & Plan Note (Signed)
Controlled and followed by endo 

## 2014-07-01 NOTE — Assessment & Plan Note (Signed)
Controlled, no change in medication DASH diet and commitment to daily physical activity for a minimum of 30 minutes discussed and encouraged, as a part of hypertension management. The importance of attaining a healthy weight is also discussed.  BP/Weight 05/22/2014 02/19/2014 02/01/2014 12/28/2013 10/11/2013 8/37/7939 08/08/8646  Systolic BP 472 072 182 883 374 451 460  Diastolic BP 74 78 87 73 80 80 78  Wt. (Lbs) 208 204 205.4 205 205 205 205  BMI 30.7 30.11 30.32 31.18 31.18 31.18 31.18

## 2014-07-01 NOTE — Assessment & Plan Note (Signed)
Controlled, no change in medication Hyperlipidemia:Low fat diet discussed and encouraged.   Lipid Panel  Lab Results  Component Value Date   CHOL 145 05/18/2014   HDL 76 05/18/2014   LDLCALC 60 05/18/2014   TRIG 47 05/18/2014   CHOLHDL 1.9 05/18/2014

## 2014-07-03 ENCOUNTER — Ambulatory Visit (INDEPENDENT_AMBULATORY_CARE_PROVIDER_SITE_OTHER): Payer: Medicare Other | Admitting: Orthopedic Surgery

## 2014-07-03 ENCOUNTER — Encounter: Payer: Self-pay | Admitting: Orthopedic Surgery

## 2014-07-03 ENCOUNTER — Ambulatory Visit (INDEPENDENT_AMBULATORY_CARE_PROVIDER_SITE_OTHER): Payer: Medicare Other

## 2014-07-03 VITALS — BP 134/73 | Ht 69.0 in | Wt 208.0 lb

## 2014-07-03 DIAGNOSIS — M129 Arthropathy, unspecified: Secondary | ICD-10-CM

## 2014-07-03 DIAGNOSIS — M25561 Pain in right knee: Secondary | ICD-10-CM

## 2014-07-03 DIAGNOSIS — M1711 Unilateral primary osteoarthritis, right knee: Secondary | ICD-10-CM

## 2014-07-03 NOTE — Progress Notes (Signed)
Patient ID: Stacey Hanson, female   DOB: Jan 08, 1946, 68 y.o.   MRN: [id number] Follow-up established patient 6 month reevaluation right knee  Chief Complaint  Patient presents with  . Follow-up    6 month recheck + xray right knee    History this is a 69 year old female soon to be 5 who has a valgus right knee with osteoarthritis but isn't fairly good condition despite significant lateral compartment disease and loss of flexion. She is very functional at this point has mild symptoms  Review of systems no catching locking or giving way  Exam shows stable vital signs as recorded and reviewed Knee flexion is limited to 90 has a less than 5 flexion contracture. Motor exam is normal knee remains stable. We did not detect effusion or tenderness except in the lateral compartment neurovascular exam was intact  Ordered and read x-rays. Valgus osteoarthritis greater than 75% loss of joint space reactive bone secondary to osteoarthritis no significant progression from previous films  Encounter Diagnoses  Name Primary?  . Right knee pain   . Arthritis of right knee Yes    Recommend six-month follow-up x-ray

## 2014-07-04 ENCOUNTER — Other Ambulatory Visit: Payer: Self-pay | Admitting: Family Medicine

## 2014-07-06 ENCOUNTER — Ambulatory Visit (HOSPITAL_COMMUNITY)
Admission: RE | Admit: 2014-07-06 | Discharge: 2014-07-06 | Disposition: A | Discharge status: Home / Self Care | Payer: Medicare Other | Source: Ambulatory Visit | Attending: Family Medicine | Admitting: Family Medicine

## 2014-07-06 DIAGNOSIS — Z1231 Encounter for screening mammogram for malignant neoplasm of breast: Secondary | ICD-10-CM | POA: Insufficient documentation

## 2014-07-16 DIAGNOSIS — I10 Essential (primary) hypertension: Secondary | ICD-10-CM | POA: Diagnosis not present

## 2014-07-16 DIAGNOSIS — E119 Type 2 diabetes mellitus without complications: Secondary | ICD-10-CM | POA: Diagnosis not present

## 2014-07-16 DIAGNOSIS — E032 Hypothyroidism due to medicaments and other exogenous substances: Secondary | ICD-10-CM | POA: Diagnosis not present

## 2014-07-16 DIAGNOSIS — E785 Hyperlipidemia, unspecified: Secondary | ICD-10-CM | POA: Diagnosis not present

## 2014-07-19 ENCOUNTER — Other Ambulatory Visit: Payer: Self-pay | Admitting: Family Medicine

## 2014-07-23 DIAGNOSIS — E782 Mixed hyperlipidemia: Secondary | ICD-10-CM | POA: Diagnosis not present

## 2014-07-23 DIAGNOSIS — I10 Essential (primary) hypertension: Secondary | ICD-10-CM | POA: Diagnosis not present

## 2014-07-23 DIAGNOSIS — E1165 Type 2 diabetes mellitus with hyperglycemia: Secondary | ICD-10-CM | POA: Diagnosis not present

## 2014-07-23 DIAGNOSIS — E032 Hypothyroidism due to medicaments and other exogenous substances: Secondary | ICD-10-CM | POA: Diagnosis not present

## 2014-08-29 ENCOUNTER — Telehealth: Payer: Self-pay | Admitting: Family Medicine

## 2014-08-30 MED ORDER — INSULIN LISPRO PROT & LISPRO (75-25 MIX) 100 UNIT/ML KWIKPEN: 40.0000 [IU] | PEN_INJECTOR | Freq: Two times a day (BID) | SUBCUTANEOUS | Status: DC

## 2014-08-30 NOTE — Telephone Encounter (Signed)
Called in.

## 2014-09-04 ENCOUNTER — Other Ambulatory Visit: Payer: Self-pay

## 2014-09-04 MED ORDER — INSULIN LISPRO PROT & LISPRO (75-25 MIX) 100 UNIT/ML KWIKPEN: 40.0000 [IU] | PEN_INJECTOR | Freq: Two times a day (BID) | SUBCUTANEOUS | Status: DC

## 2014-09-10 ENCOUNTER — Other Ambulatory Visit: Payer: Self-pay

## 2014-09-10 MED ORDER — INSULIN PEN NEEDLE 31G X 5 MM MISC: Status: DC

## 2014-09-24 DIAGNOSIS — I10 Essential (primary) hypertension: Secondary | ICD-10-CM | POA: Diagnosis not present

## 2014-09-24 DIAGNOSIS — E109 Type 1 diabetes mellitus without complications: Secondary | ICD-10-CM | POA: Diagnosis not present

## 2014-09-25 LAB — BASIC METABOLIC PANEL WITH GFR
BUN: 21 mg/dL (ref 7–25)
CALCIUM: 9.6 mg/dL (ref 8.6–10.4)
CO2: 24 mmol/L (ref 20–31)
CREATININE: 0.86 mg/dL (ref 0.50–0.99)
Chloride: 100 mmol/L (ref 98–110)
GFR, EST AFRICAN AMERICAN: 80 mL/min (ref >=60)
GFR, EST NON AFRICAN AMERICAN: 69 mL/min (ref >=60)
GLUCOSE: 68 mg/dL (ref 65–99)
Potassium: 4.7 mmol/L (ref 3.5–5.3)
Sodium: 139 mmol/L (ref 135–146)

## 2014-09-25 LAB — CBC
HCT: 37.5 % (ref 36.0–46.0)
Hemoglobin: 12.2 g/dL (ref 12.0–15.0)
MCH: 29.9 pg (ref 26.0–34.0)
MCHC: 32.5 g/dL (ref 30.0–36.0)
MCV: 91.9 fL (ref 78.0–100.0)
MPV: 9.5 fL (ref 8.6–12.4)
Platelets: 459 10*3/uL — ABNORMAL HIGH (ref 150–400)
RBC: 4.08 MIL/uL (ref 3.87–5.11)
RDW: 14.1 % (ref 11.5–15.5)
WBC: 6.9 10*3/uL (ref 4.0–10.5)

## 2014-09-25 LAB — HEMOGLOBIN A1C
Hgb A1c MFr Bld: 6.7 % — ABNORMAL HIGH (ref <5.7)
Mean Plasma Glucose: 146 mg/dL — ABNORMAL HIGH (ref <117)

## 2014-09-27 ENCOUNTER — Ambulatory Visit (INDEPENDENT_AMBULATORY_CARE_PROVIDER_SITE_OTHER): Payer: Medicare Other | Admitting: Family Medicine

## 2014-09-27 ENCOUNTER — Encounter: Payer: Self-pay | Admitting: Family Medicine

## 2014-09-27 VITALS — BP 134/70 | HR 98 | Resp 18 | Ht 69.0 in | Wt 210.0 lb

## 2014-09-27 DIAGNOSIS — E119 Type 2 diabetes mellitus without complications: Secondary | ICD-10-CM

## 2014-09-27 DIAGNOSIS — E109 Type 1 diabetes mellitus without complications: Secondary | ICD-10-CM | POA: Diagnosis not present

## 2014-09-27 DIAGNOSIS — Z0000 Encounter for general adult medical examination without abnormal findings: Secondary | ICD-10-CM | POA: Insufficient documentation

## 2014-09-27 DIAGNOSIS — IMO0001 Reserved for inherently not codable concepts without codable children: Secondary | ICD-10-CM

## 2014-09-27 DIAGNOSIS — Z1211 Encounter for screening for malignant neoplasm of colon: Secondary | ICD-10-CM | POA: Diagnosis not present

## 2014-09-27 DIAGNOSIS — I10 Essential (primary) hypertension: Secondary | ICD-10-CM

## 2014-09-27 DIAGNOSIS — Z794 Long term (current) use of insulin: Secondary | ICD-10-CM

## 2014-09-27 DIAGNOSIS — E785 Hyperlipidemia, unspecified: Secondary | ICD-10-CM | POA: Diagnosis not present

## 2014-09-27 LAB — POC HEMOCCULT BLD/STL (OFFICE/1-CARD/DIAGNOSTIC): Fecal Occult Blood, POC: NEGATIVE

## 2014-09-27 NOTE — Assessment & Plan Note (Signed)

## 2014-09-27 NOTE — Patient Instructions (Addendum)
Annual wellness Dec 22 or after, call if you need me sooner  HBA1C, chem 7 and EGFR 2nd week in November, no fasting  Reduce humalog to 35 units twice daily, excellent blood sugar and cholesterol  Continue  To enjoy exercise  Call and come for flu vaccine in September  Thanks for choosing [facility], we consider it a privelige to serve you.  Please work on good  health habits so that your health will improve. 1. Commitment to daily physical activity for 30 to 60  minutes, if you are able to do this.  2. Commitment to wise food choices. Aim for half of your  food intake to be vegetable and fruit, one quarter starchy foods, and one quarter protein. Try to eat on a regular schedule  3 meals per day, snacking between meals should be limited to vegetables or fruits or small portions of nuts. 64 ounces of water per day is generally recommended, unless you have specific health conditions, like heart failure or kidney failure where you will need to limit fluid intake.  3. Commitment to sufficient and a  good quality of physical and mental rest daily, generally between 6 to 8 hours per day.  WITH PERSISTANCE AND PERSEVERANCE, THE IMPOSSIBLE , BECOMES THE NORM!

## 2014-09-27 NOTE — Progress Notes (Signed)
Subjective:    Patient ID: Stacey Hanson, female    DOB: 12-Jan-1946, 69 y.o.   MRN: [id number]  HPI Patient is in for annual physical exam. No other health concerns are expressed or addressed at the visit. Recent labs,  are reviewed. Immunization is reviewed , and  updated if needed.    Review of Systems See HPI     Objective:   Physical Exam  Pleasant well nourished female, alert and oriented x 3, in no cardio-pulmonary distress. Afebrile. HEENT No facial trauma or asymetry. Sinuses non tender.  Extra occullar muscles intact, pupils equally reactive to light. External ears normal, tympanic membranes clear. Oropharynx moist, no exudate, fair  dentition. Neck: supple, no adenopathy,JVD or thyromegaly.No bruits.  Chest: Clear to ascultation bilaterally.No crackles or wheezes. Non tender to palpation  Breast: No asymetry,no masses or lumps. No tenderness. No nipple discharge or inversion. No axillary or supraclavicular adenopathy  Cardiovascular system; Heart sounds normal,  S1 and  S2 ,no S3.  No murmur, or thrill. Apical beat not displaced Peripheral pulses normal.  Abdomen: Soft, non tender, no organomegaly or masses. No bruits. Bowel sounds normal. No guarding, tenderness or rebound.  Rectal:  Normal sphincter tone. No mass.No rectal masses.  Guaiac negative stool.  GU: External genitalia normal female genitalia , female distribution of hair. No lesions. Urethral meatus normal in size, no  Prolapse, no lesions visibly  Present. Bladder non tender. Vagina pink and moist , with no visible lesions , discharge present . Adequate pelvic support no  cystocele or rectocele noted  Uterus absent , no adnexal masses, no  adnexal tenderness.   Musculoskeletal exam: Adequate but reduced ROM of spine, hips , shoulders and knees. No deformity ,swelling or crepitus noted. No muscle wasting or atrophy.   Neurologic: Cranial nerves 2 to 12 intact. Power, tone  ,sensation and reflexes normal throughout. No disturbance in gait. No tremor.  Skin: Intact, no ulceration, erythema , scaling or rash noted. Pigmentation normal throughout  Psych; Normal mood and affect. Judgement and concentration normal       Assessment & Plan:  Annual physical exam Annual exam as documented. Counseling done  re healthy lifestyle involving commitment to 150 minutes exercise per week, heart healthy diet, and attaining healthy weight.The importance of adequate sleep also discussed. Regular seat belt use and home safety, is also discussed. Changes in health habits are decided on by the patient with goals and time frames  set for achieving them. Immunization and cancer screening needs are specifically addressed at this visit.   Diabetes mellitus, insulin dependent (IDDM), controlled Reduce humalog dose to 35 units twice daily, rept in 3 month Ms. Stickley is reminded of the importance of commitment to daily physical activity for 30 minutes or more, as able and the need to limit carbohydrate intake to 30 to 60 grams per meal to help with blood sugar control.   The need to take medication as prescribed, test blood sugar as directed, and to call between visits if there is a concern that blood sugar is uncontrolled is also discussed.   Ms. Cummings is reminded of the importance of daily foot exam, annual eye examination, and good blood sugar, blood pressure and cholesterol control.  Diabetic Labs Latest Ref Rng 09/24/2014 05/18/2014 02/19/2014 02/15/2014 10/07/2013  HbA1c <5.7 % 6.7(H) 6.9(H) - 7.6(H) 7.2(H)  Microalbumin <2.0 mg/dL - - 0.2 - -  Micro/Creat Ratio 0.0 - 30.0 mg/g - - 3.8 - -  Chol 0 - 200  mg/dL - 145 - - 137  HDL >=46 mg/dL - 76 - - 55  Calc LDL 0 - 99 mg/dL - 60 - - 70  Triglycerides <150 mg/dL - 47 - - 61  Creatinine 0.50 - 0.99 mg/dL 0.86 0.91 - 0.98 0.77   BP/Weight 09/27/2014 07/03/2014 05/22/2014 02/19/2014 02/01/2014 12/28/2013 04/13/2480  Systolic BP  500 370 488 891 694 503 888  Diastolic BP 70 73 74 78 87 73 80  Wt. (Lbs) 210.04 208 208 204 205.4 205 205  BMI 31 30.7 30.7 30.11 30.32 31.18 31.18   Foot/eye exam completion dates Latest Ref Rng 09/27/2014 09/08/2013  Eye Exam No Retinopathy - No Retinopathy  Foot exam Order - - -  Foot Form Completion - Done -

## 2014-09-27 NOTE — Assessment & Plan Note (Addendum)
Reduce humalog dose to 35 units twice daily, rept in 3 month Ms. Dewitt is reminded of the importance of commitment to daily physical activity for 30 minutes or more, as able and the need to limit carbohydrate intake to 30 to 60 grams per meal to help with blood sugar control.   The need to take medication as prescribed, test blood sugar as directed, and to call between visits if there is a concern that blood sugar is uncontrolled is also discussed.   Ms. Wirick is reminded of the importance of daily foot exam, annual eye examination, and good blood sugar, blood pressure and cholesterol control.  Diabetic Labs Latest Ref Rng 09/24/2014 05/18/2014 02/19/2014 02/15/2014 10/07/2013  HbA1c <5.7 % 6.7(H) 6.9(H) - 7.6(H) 7.2(H)  Microalbumin <2.0 mg/dL - - 0.2 - -  Micro/Creat Ratio 0.0 - 30.0 mg/g - - 3.8 - -  Chol 0 - 200 mg/dL - 145 - - 137  HDL >=46 mg/dL - 76 - - 55  Calc LDL 0 - 99 mg/dL - 60 - - 70  Triglycerides <150 mg/dL - 47 - - 61  Creatinine 0.50 - 0.99 mg/dL 0.86 0.91 - 0.98 0.77   BP/Weight 09/27/2014 07/03/2014 05/22/2014 02/19/2014 02/01/2014 12/28/2013 0/35/0093  Systolic BP 818 299 371 696 789 381 017  Diastolic BP 70 73 74 78 87 73 80  Wt. (Lbs) 210.04 208 208 204 205.4 205 205  BMI 31 30.7 30.7 30.11 30.32 31.18 31.18   Foot/eye exam completion dates Latest Ref Rng 09/27/2014 09/08/2013  Eye Exam No Retinopathy - No Retinopathy  Foot exam Order - - -  Foot Form Completion - Done -

## 2014-10-01 DIAGNOSIS — H52223 Regular astigmatism, bilateral: Secondary | ICD-10-CM | POA: Diagnosis not present

## 2014-10-01 DIAGNOSIS — E119 Type 2 diabetes mellitus without complications: Secondary | ICD-10-CM | POA: Diagnosis not present

## 2014-10-01 DIAGNOSIS — H524 Presbyopia: Secondary | ICD-10-CM | POA: Diagnosis not present

## 2014-10-01 DIAGNOSIS — H2523 Age-related cataract, morgagnian type, bilateral: Secondary | ICD-10-CM | POA: Diagnosis not present

## 2014-10-08 ENCOUNTER — Telehealth: Payer: Self-pay | Admitting: *Deleted

## 2014-10-08 MED ORDER — HYDROCHLOROTHIAZIDE 25 MG PO TABS: 25.0000 mg | ORAL_TABLET | Freq: Every day | ORAL | Status: DC

## 2014-10-08 MED ORDER — BENAZEPRIL HCL 40 MG PO TABS: 40.0000 mg | ORAL_TABLET | Freq: Every day | ORAL | Status: DC

## 2014-10-08 NOTE — Telephone Encounter (Signed)
meds refilled 

## 2014-10-08 NOTE — Telephone Encounter (Signed)
Pt called requesting a refill on both of her blood pressure medications to [facility] in [place]. Please advise

## 2014-11-02 ENCOUNTER — Other Ambulatory Visit (INDEPENDENT_AMBULATORY_CARE_PROVIDER_SITE_OTHER): Payer: Self-pay | Admitting: Otolaryngology

## 2014-11-02 DIAGNOSIS — E041 Nontoxic single thyroid nodule: Secondary | ICD-10-CM

## 2014-11-09 ENCOUNTER — Ambulatory Visit (HOSPITAL_COMMUNITY)
Admission: RE | Admit: 2014-11-09 | Discharge: 2014-11-09 | Disposition: A | Discharge status: Home / Self Care | Payer: Medicare Other | Source: Ambulatory Visit | Attending: Otolaryngology | Admitting: Otolaryngology

## 2014-11-09 DIAGNOSIS — E042 Nontoxic multinodular goiter: Secondary | ICD-10-CM | POA: Diagnosis not present

## 2014-11-09 DIAGNOSIS — E041 Nontoxic single thyroid nodule: Secondary | ICD-10-CM

## 2014-11-14 ENCOUNTER — Ambulatory Visit (INDEPENDENT_AMBULATORY_CARE_PROVIDER_SITE_OTHER): Payer: Medicare Other

## 2014-11-14 DIAGNOSIS — Z23 Encounter for immunization: Secondary | ICD-10-CM | POA: Diagnosis not present

## 2014-11-27 ENCOUNTER — Telehealth: Payer: Self-pay | Admitting: *Deleted

## 2014-11-27 NOTE — Telephone Encounter (Signed)
Patient came into the office requesting the lansics and strips called into [facility] in [place].

## 2014-11-28 ENCOUNTER — Other Ambulatory Visit: Payer: Self-pay

## 2014-11-28 MED ORDER — GLUCOSE BLOOD VI STRP: ORAL_STRIP | Status: DC

## 2014-11-28 MED ORDER — ONETOUCH DELICA LANCETS 33G MISC: Status: DC

## 2014-11-28 NOTE — Telephone Encounter (Signed)
One touch lancets and strips refilled to [facility] per patient request

## 2014-11-29 ENCOUNTER — Ambulatory Visit (INDEPENDENT_AMBULATORY_CARE_PROVIDER_SITE_OTHER): Payer: Medicare Other | Admitting: Otolaryngology

## 2014-11-29 DIAGNOSIS — D440 Neoplasm of uncertain behavior of thyroid gland: Secondary | ICD-10-CM

## 2014-12-03 ENCOUNTER — Telehealth: Payer: Self-pay | Admitting: *Deleted

## 2014-12-03 MED ORDER — METFORMIN HCL 1000 MG PO TABS: ORAL_TABLET | ORAL | Status: DC

## 2014-12-03 NOTE — Telephone Encounter (Signed)
Patient called requesting her metformin to be refilled to [facility] in [place] 

## 2014-12-03 NOTE — Telephone Encounter (Signed)
Med refilled.

## 2014-12-25 ENCOUNTER — Other Ambulatory Visit: Payer: Self-pay

## 2014-12-25 MED ORDER — PRAVASTATIN SODIUM 40 MG PO TABS: 40.0000 mg | ORAL_TABLET | Freq: Every day | ORAL | Status: DC

## 2015-01-03 ENCOUNTER — Ambulatory Visit (INDEPENDENT_AMBULATORY_CARE_PROVIDER_SITE_OTHER): Payer: Medicare Other

## 2015-01-03 ENCOUNTER — Encounter: Payer: Self-pay | Admitting: Orthopedic Surgery

## 2015-01-03 ENCOUNTER — Ambulatory Visit (INDEPENDENT_AMBULATORY_CARE_PROVIDER_SITE_OTHER): Payer: Medicare Other | Admitting: Orthopedic Surgery

## 2015-01-03 VITALS — BP 137/76 | Ht 69.0 in | Wt 208.0 lb

## 2015-01-03 DIAGNOSIS — M129 Arthropathy, unspecified: Secondary | ICD-10-CM | POA: Diagnosis not present

## 2015-01-03 DIAGNOSIS — M1710 Unilateral primary osteoarthritis, unspecified knee: Secondary | ICD-10-CM

## 2015-01-03 NOTE — Progress Notes (Signed)
   Subjective:    Patient ID: Stacey Hanson, female    DOB: [birth year], 69 y.o.   MRN: [id number]  Chief Complaint  Patient presents with  . Follow-up    6 month recheck on right knee with xray.    HPI  The patient is being followed for osteoarthritis of the right knee. She has no complaints. Mild knee pain.  Review of Systems Musculoskeletal system she denies catching locking giving way    Objective:   Physical Exam  BP 137/76 mmHg  Ht 5\' 9"  (1.753 m)  Wt 208 lb (94.348 kg)  BMI 30.70 kg/m2 She is alert awake and oriented 3 her appearance is normal mood is pleasant her gait is unsupported her knees valgus realigned with no swelling her flexion is 120 knee is stable motor exam normal      Assessment & Plan:  X-rays compared to the film taken on 12/28/2013 I interpret today's film as 3 compartment arthritis lateral compartment worse secondary bone changes of cyst formation and spur with no progression from the x-ray taken 12/28/2013  Come back in one year for x-ray again barring any symptoms.  Encounter Diagnosis  Name Primary?  Marland Kitchen Arthritis of knee Yes

## 2015-01-14 DIAGNOSIS — E109 Type 1 diabetes mellitus without complications: Secondary | ICD-10-CM | POA: Diagnosis not present

## 2015-01-15 LAB — HEMOGLOBIN A1C
Hgb A1c MFr Bld: 7.8 % — ABNORMAL HIGH (ref <5.7)
MEAN PLASMA GLUCOSE: 177 mg/dL — AB (ref <117)

## 2015-01-15 LAB — BASIC METABOLIC PANEL WITH GFR
BUN: 21 mg/dL (ref 7–25)
CALCIUM: 9.6 mg/dL (ref 8.6–10.4)
CO2: 27 mmol/L (ref 20–31)
Chloride: 97 mmol/L — ABNORMAL LOW (ref 98–110)
Creat: 0.77 mg/dL (ref 0.50–0.99)
GFR, Est Non African American: 79 mL/min (ref >=60)
GLUCOSE: 74 mg/dL (ref 65–99)
Potassium: 4.1 mmol/L (ref 3.5–5.3)
Sodium: 134 mmol/L — ABNORMAL LOW (ref 135–146)

## 2015-01-23 ENCOUNTER — Other Ambulatory Visit: Payer: Self-pay | Admitting: "Endocrinology

## 2015-01-23 ENCOUNTER — Other Ambulatory Visit: Payer: Self-pay | Admitting: Gastroenterology

## 2015-01-29 ENCOUNTER — Other Ambulatory Visit: Payer: Self-pay

## 2015-01-29 NOTE — Telephone Encounter (Signed)
OPENED IN ERROR. RX WAS FILLED YESTERDAY.

## 2015-02-07 ENCOUNTER — Ambulatory Visit (INDEPENDENT_AMBULATORY_CARE_PROVIDER_SITE_OTHER): Payer: Medicare Other | Admitting: Gastroenterology

## 2015-02-07 ENCOUNTER — Encounter: Payer: Self-pay | Admitting: Gastroenterology

## 2015-02-07 VITALS — BP 127/80 | HR 92 | Temp 97.9°F | Ht 68.0 in | Wt 210.0 lb

## 2015-02-07 DIAGNOSIS — Z1211 Encounter for screening for malignant neoplasm of colon: Secondary | ICD-10-CM | POA: Insufficient documentation

## 2015-02-07 DIAGNOSIS — K219 Gastro-esophageal reflux disease without esophagitis: Secondary | ICD-10-CM | POA: Diagnosis not present

## 2015-02-07 NOTE — Patient Instructions (Signed)
CONTINUE YOUR WEIGHT LOSS EFFORTS. LOSE 10 LBS.  FOLLOW A LOW FAT DIABETIC DIET. AVOID FRIED FOODS. CUT PORTIONS. MEATS SHOULD BE BAKED, BROILED, OR BOILED.  CONTINUE OMEPRAZOLE.  TAKE 30 MINUTES PRIOR TO YOUR FIRST MEAL.  FOLLOW UP IN ONE YEAR. MERRY CHRISTMAS AND HAPPY NEW YEAR!

## 2015-02-07 NOTE — Assessment & Plan Note (Signed)
SYMPTOMS CONTROLLED/RESOLVED.  CONTINUE YOUR WEIGHT LOSS EFFORTS. LOSE 10 LBS. FOLLOW A LOW FAT DIABETIC DIET. AVOID FRIED FOODS. CUT PORTIONS. MEATS SHOULD BE BAKED, BROILED, OR BOILED. CONTINUE OMEPRAZOLE.  TAKE 30 MINUTES PRIOR TO YOUR FIRST MEAL. FOLLOW UP IN ONE YEAR.  GREATER THAN 50% WAS SPENT IN COUNSELING & COORDINATION OF CARE WITH THE PATIENT: DISCUSSED DIFFERENTIAL DIAGNOSIS, PROCEDURE, BENEFITS, AND MANAGEMENT OF GERD AND COLON CANCER SCREENING DISEASE. TOTAL ENCOUNTER TIME: 15 MINS.

## 2015-02-07 NOTE — Progress Notes (Signed)
CC'ED TO PCP 

## 2015-02-07 NOTE — Progress Notes (Signed)
ON RECALL  °

## 2015-02-07 NOTE — Assessment & Plan Note (Signed)
AVERAGE RISK-NO WARNING SIGNS/SYMPTOMS  NEXT TCS IN 2009. EXPLAINED TO PT SHE SHOULD HAVE ONE SOONER IF SHE DEVELOPS CHANGE IN BOWEL HABITS, RECTAL BLEEDING, OR DIARRHEA.

## 2015-02-07 NOTE — Progress Notes (Signed)
Subjective:    Patient ID: Stacey Hanson, female    DOB: [birth year], 69 y.o.   MRN: [id number]  Tula Nakayama, MD   HPI No questions or concerns. BMs: GOOD. SWALLOWING GOOD. EATING MORE FIBER. MORE HUNRY WITH INCREASE DOSE OF SYNTHROID.  PT DENIES FEVER, CHILLS, HEMATOCHEZIA, HEMATEMESIS, nausea, vomiting, melena, diarrhea, CHEST PAIN, SHORTNESS OF BREATH, CHANGE IN BOWEL IN HABITS, constipation, abdominal pain, problems swallowing,OR heartburn or indigestion.  Past Medical History  Diagnosis Date  . 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 Knee pain   . Obesity   . Hyperlipidemia   . Hypertension   . IDDM (insulin dependent diabetes mellitus) ([place]) 1987  . Diabetes mellitus   . BMI 30.0-30.9,adult 2012 215 LBS    2004 198 LBS  . H. pylori infection 09/24/2010   Past Surgical History  Procedure Laterality Date  . Abdominal hysterectomy  1970  . Cholecystectomy  OCT 2010 BZ BILIARY DYSKINESIA    CHRONIC CHOLECYSTITIS  . Colonoscopy  MAY 2009 SCREENING    pTC TICS, SML IH  . Total abdominal hysterectomy  1970  . Upper gastrointestinal endoscopy  OCT 2004 RMR    DUO TIC, & ULCER  . Esophagogastroduodenoscopy  10/03/2010    Procedure: ESOPHAGOGASTRODUODENOSCOPY (EGD);  Surgeon: Dorothyann Peng, MD;  [facility];  Service: Endoscopy;  Laterality: N/A;  . Savory dilation  10/03/2010    Procedure: SAVORY DILATION;  Surgeon: Dorothyann Peng, MD;  Location: [facility];  Service: Endoscopy;;  . Esophagogastroduodenoscopy  12/19/2002    RMR: Normal esophagus/Normal stomach/ Duodenum large bulbar diverticulum, 1 cm bulbar ulcer with surrounding/ inflammation as described above. Normal D2    Allergies  Allergen Reactions  . Simvastatin     REACTION: muscle cramps   Current Outpatient Prescriptions  Medication Sig Dispense Refill  . benazepril (LOTENSIN) 40 MG tablet Take 1 tablet (40 mg total) by mouth daily.    . Blood Glucose Monitoring Suppl  ([place]) KIT by Does not apply route 2 (two) times daily. Two times a day testing     . Calcium Carbonate-Vitamin D (CALCIUM PLUS VITAMIN D PO) Take by mouth 2 (two) times daily. Take two tablets daily     . glucose blood (ONE TOUCH ULTRA TEST) test strip Use as instructed twice daily dx 250.01    . glucose blood (TRUETRACK TEST) test strip 1 each by Other route 2 (two) times daily. Two times a day     . hydrochlorothiazide (HYDRODIURIL) 25 MG tablet Take 1 tablet (25 mg total) by mouth daily.    . Insulin Lispro Prot & Lispro (HUMALOG 75/25 MIX) (75-25) 100 UNIT/ML Kwikpen Inject 40 Units into the skin 2 (two) times daily.    . Insulin Pen Needle (B-D UF III MINI PEN NEEDLES) 31G X 5 MM MISC USE AS DIRECTED    . levothyroxine (SYNTHROID, LEVOTHROID) 88 MCG tablet TAKE 1 TABLET BY MOUTH EVERY MORNING    . metFORMIN (GLUCOPHAGE) 1000 MG tablet TAKE 1 TABLET BY MOUTH TWICE DAILY WITH A MEAL    . Multiple Vitamin (MULTIVITAMIN) capsule Take 1 capsule by mouth daily.      Marland Kitchen omeprazole (PRILOSEC) 20 MG capsule TAKE 1 CAPSULE BY MOUTH EVERY MORNING    . ONETOUCH DELICA LANCETS 53G MISC Use as directed twice daily dx 250.01    . pravastatin (PRAVACHOL) 40 MG tablet Take 1 tablet (40 mg total) by mouth daily.  Review of Systems PER HPI OTHERWISE ALL SYSTEMS ARE NEGATIVE.    Objective:   Physical Exam  Constitutional: She is oriented to person, place, and time. She appears well-developed and well-nourished. No distress.  HENT:  Head: Normocephalic and atraumatic.  Mouth/Throat: Oropharynx is clear and moist. No oropharyngeal exudate.  Eyes: Pupils are equal, round, and reactive to light. No scleral icterus.  Neck: Normal range of motion. Neck supple.  Cardiovascular: Normal rate, regular rhythm and normal heart sounds.   Pulmonary/Chest: Effort normal and breath sounds normal. No respiratory distress.  Abdominal: Soft. Bowel sounds are normal. She exhibits no distension. There is  no tenderness.  Musculoskeletal: She exhibits no edema.  Lymphadenopathy:    She has no cervical adenopathy.  Neurological: She is alert and oriented to person, place, and time.  NO FOCAL DEFICITS  Psychiatric: She has a normal mood and affect.  Vitals reviewed.         Assessment & Plan:

## 2015-02-11 ENCOUNTER — Other Ambulatory Visit: Payer: Self-pay

## 2015-02-11 ENCOUNTER — Telehealth: Payer: Self-pay | Admitting: Family Medicine

## 2015-02-11 MED ORDER — BENAZEPRIL HCL 40 MG PO TABS: 40.0000 mg | ORAL_TABLET | Freq: Every day | ORAL | Status: DC

## 2015-02-11 MED ORDER — HYDROCHLOROTHIAZIDE 25 MG PO TABS: 25.0000 mg | ORAL_TABLET | Freq: Every day | ORAL | Status: DC

## 2015-02-11 NOTE — Telephone Encounter (Signed)
Medications refilled

## 2015-02-11 NOTE — Telephone Encounter (Signed)
Stacey Hanson is asking for refills on medications benazepril (LOTENSIN) 40 MG tablet and hydrochlorothiazide (HYDRODIURIL) 25 MG tablet

## 2015-02-19 ENCOUNTER — Other Ambulatory Visit: Payer: Self-pay

## 2015-02-19 ENCOUNTER — Telehealth: Payer: Self-pay | Admitting: Family Medicine

## 2015-02-19 MED ORDER — INSULIN PEN NEEDLE 31G X 5 MM MISC: Status: DC

## 2015-02-19 NOTE — Telephone Encounter (Signed)
Med refilled.

## 2015-02-19 NOTE — Telephone Encounter (Signed)
Patient is asking for a refill on Insulin Pen Needle (B-D UF III MINI PEN NEEDLES) 31G X 5 MM MISC  Sent to [facility]

## 2015-02-21 ENCOUNTER — Other Ambulatory Visit: Payer: Self-pay

## 2015-02-21 ENCOUNTER — Ambulatory Visit (INDEPENDENT_AMBULATORY_CARE_PROVIDER_SITE_OTHER): Payer: Medicare Other | Admitting: Family Medicine

## 2015-02-21 ENCOUNTER — Encounter: Payer: Self-pay | Admitting: Family Medicine

## 2015-02-21 VITALS — BP 122/64 | HR 95 | Resp 18 | Ht 69.0 in | Wt 209.0 lb

## 2015-02-21 DIAGNOSIS — Z794 Long term (current) use of insulin: Secondary | ICD-10-CM

## 2015-02-21 DIAGNOSIS — IMO0001 Reserved for inherently not codable concepts without codable children: Secondary | ICD-10-CM

## 2015-02-21 DIAGNOSIS — E119 Type 2 diabetes mellitus without complications: Secondary | ICD-10-CM

## 2015-02-21 DIAGNOSIS — J3089 Other allergic rhinitis: Secondary | ICD-10-CM

## 2015-02-21 DIAGNOSIS — Z1159 Encounter for screening for other viral diseases: Secondary | ICD-10-CM

## 2015-02-21 DIAGNOSIS — J302 Other seasonal allergic rhinitis: Secondary | ICD-10-CM | POA: Insufficient documentation

## 2015-02-21 DIAGNOSIS — Z0000 Encounter for general adult medical examination without abnormal findings: Secondary | ICD-10-CM

## 2015-02-21 DIAGNOSIS — R079 Chest pain, unspecified: Secondary | ICD-10-CM

## 2015-02-21 DIAGNOSIS — E785 Hyperlipidemia, unspecified: Secondary | ICD-10-CM

## 2015-02-21 MED ORDER — ASPIRIN EC 81 MG PO TBEC: 81.0000 mg | DELAYED_RELEASE_TABLET | Freq: Every day | ORAL | Status: DC

## 2015-02-21 MED ORDER — LORATADINE 10 MG PO TABS: 10.0000 mg | ORAL_TABLET | Freq: Every day | ORAL | Status: DC

## 2015-02-21 NOTE — Patient Instructions (Signed)
F/u eend Feb 21, or after, call if you need me sooner  New medication, LORATIDINE sent for daily use for allergies  EKG in office today due to intermittent chest pain and need to see cardiologist , based on personal and  Family history  I recommend joining senior's group next year  Continue to test sugar regularly and cut back on sweets and carbs  Thanks for choosing [facility], we consider it a privelige to serve you.  All the best for 2017!  Fating labs Feb 14 or after

## 2015-02-21 NOTE — Assessment & Plan Note (Signed)

## 2015-02-21 NOTE — Progress Notes (Signed)
Subjective:    Patient ID: Stacey Hanson, female    DOB: Oct 21, 1945, 69 y.o.   MRN: [id number]  HPI Preventive Screening-Counseling & Management   Patient present here today for a Medicare annual wellness visit. 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 Yes due to holiday season Over the past two weeks, have you felt little interest or pleasure in doing things? Yes Have you lost interest or pleasure in daily life? Yes  Do you often feel hopeless? Yes Do you cry easily over simple problems? No   Activities of Daily Living  In your present state of health, do you have any difficulty performing the following activities?  Driving?: No Managing money?: No Feeding yourself?:No Getting from bed to chair?:No Climbing a flight of stairs?:No Preparing food and eating?:No Bathing or showering?:No Getting dressed?:No Getting to the toilet?:No Using the toilet?:No Moving around from place to place?: No  Fall Risk Assessment In the past year have you fallen or had a near fall?:No Are you currently taking any medications that make you dizzy?:No   Hearing Difficulties: No Do you often ask people to speak up or repeat themselves?:No Do you experience ringing or noises  in your ears?:No Do you have difficulty understanding soft or whispered voices?:No  Cognitive Testing  Alert? Yes Normal Appearance?Yes  Oriented to person? Yes Place? Yes  Time? Yes  Displays appropriate judgment?Yes  Can read the correct time from a watch face? yes Are you having problems remembering things?No  Advanced Directives have been discussed with the patient?Yes , full codeand brochure given to patient   List the Names of Other Physician/Practitioners you currently use: careteams updated    Indicate any recent Medical Services you may have received from other than [facility] providers in the past year (date may be approximate).   Assessment:    Annual Wellness Exam   Plan:    During the course of the visit the patient was educated and counseled about appropriate screening and preventive services including:  A healthy diet is rich in fruit, vegetables and whole grains. Poultry fish, nuts and beans are a healthy choice for protein rather then red meat. A low sodium diet and drinking 64 ounces of water daily is generally recommended. Oils and sweet should be limited. Carbohydrates especially for those who are diabetic or overweight, should be limited to 30-45 gram per meal. It is important to eat on a regular schedule, at least 3 times daily. Snacks should be primarily fruits, vegetables or nuts. It is important that you exercise regularly at least 30 minutes 5 times a week. If you develop chest pain, have severe difficulty breathing, or feel very tired, stop exercising immediately and seek medical attention  Immunization reviewed and updated. Cancer screening reviewed and updated    Patient Instructions (the written plan) was given to the patient.  Medicare Attestation  I have personally reviewed:  The patient's medical and social history  Their use of alcohol, tobacco or illicit drugs  Their current medications and supplements  The patient's functional ability including  ADLs,fall risks, home safety risks, cognitive, and hearing and visual impairment  Diet and physical activities  Evidence for depression or mood disorders  The patient's weight, height, BMI, and visual acuity have been recorded in the chart. I have made referrals, counseling, and provided education to the patient based on review of the above and I have provided the patient with a written personalized care plan for preventive services.      Review of Systems     Objective:   Physical Exam BP 122/64 mmHg  Pulse 95  Resp 18  Ht 5\' 9"  (1.753 m)  Wt 209 lb (94.802 kg)  BMI 30.85 kg/m2  SpO2 98%  Chest: Clear to ascultation bilaterally, non tender  CVS: S1 and S2 , no S3, no murmur  EKG: NSR, no ischemia, no LVH     Assessment & Plan:  Medicare annual wellness visit, subsequent Annual exam as documented. Counseling done  re healthy lifestyle involving commitment to 150 minutes exercise per week, heart healthy diet, and attaining healthy weight.The importance of adequate sleep also discussed. Regular seat belt use and home safety, is also discussed. Changes in health habits are decided on by the patient with goals and time frames  set for achieving them. Immunization and cancer screening needs are specifically addressed at this visit.   Chest pain with high risk for cardiac etiology Intermittent chest pain, patient has diabetes, hyperlipidemia and hypertension, 2 sibs died prematruresly , ages 19 and 49 of MI, EKG today and refer to cardiology

## 2015-02-21 NOTE — Assessment & Plan Note (Signed)
Intermittent chest pain, patient has diabetes, hyperlipidemia and hypertension, 2 sibs died prematruresly , ages 81 and 5 of MI, EKG today and refer to cardiology

## 2015-02-23 ENCOUNTER — Other Ambulatory Visit: Payer: Self-pay | Admitting: "Endocrinology

## 2015-03-11 ENCOUNTER — Encounter: Payer: Self-pay | Admitting: Family Medicine

## 2015-03-27 ENCOUNTER — Ambulatory Visit (INDEPENDENT_AMBULATORY_CARE_PROVIDER_SITE_OTHER): Payer: Medicare Other | Admitting: Cardiovascular Disease

## 2015-03-27 ENCOUNTER — Encounter: Payer: Self-pay | Admitting: Cardiovascular Disease

## 2015-03-27 VITALS — BP 136/78 | HR 99 | Ht 69.0 in | Wt 212.0 lb

## 2015-03-27 DIAGNOSIS — R079 Chest pain, unspecified: Secondary | ICD-10-CM

## 2015-03-27 DIAGNOSIS — Z8249 Family history of ischemic heart disease and other diseases of the circulatory system: Secondary | ICD-10-CM | POA: Diagnosis not present

## 2015-03-27 DIAGNOSIS — E785 Hyperlipidemia, unspecified: Secondary | ICD-10-CM | POA: Diagnosis not present

## 2015-03-27 DIAGNOSIS — I10 Essential (primary) hypertension: Secondary | ICD-10-CM | POA: Diagnosis not present

## 2015-03-27 NOTE — Patient Instructions (Signed)
Medication Instructions:  Your physician recommends that you continue on your current medications as directed. Please refer to the Current Medication list given to you today.   Labwork: NONE  Testing/Procedures: Your physician has requested that you have a lexiscan myoview. For further information please visit [email or web address]. Please follow instruction sheet, as given.    Follow-Up: Your physician recommends that you schedule a follow-up appointment in: 6 WEEKS    Any Other Special Instructions Will Be Listed Below (If Applicable).     If you need a refill on your cardiac medications before your next appointment, please call your pharmacy.   

## 2015-03-27 NOTE — Progress Notes (Signed)
Patient ID: Stacey Hanson, female   DOB: Mar 01, 1946, 70 y.o.   MRN: [id number]       CARDIOLOGY CONSULT NOTE  Patient ID: Stacey Hanson MRN: [id number] DOB/AGE: 1945-09-18 70 y.o.  Admit date: (Not on file) Primary Physician Tula Nakayama, MD  Reason for Consultation: chest pain  HPI: 70 yr old woman with diabetes, HTN, hyperlipidemia, and family h/o premature CAD referred for chest pain evaluation.  Says she began experiencing fleeting chest pains across the top of her chest in the Fall of 2016. Occur with rest, never with exertion. Occasional radiation to back. No associated nausea, shortness of breath, left arm pain, or lightheadedness. Denies leg swelling, palpitations, orthopnea, and PND.  ECG on 02/21/15 showed sinus rhythm with no ischemic ST-T abnormalities.  LDL 60 on 05/18/14 on statin therapy.  Fam: 16 yr old brother and 42 yr old brother died of MI's.  Allergies  Allergen Reactions  . Simvastatin     REACTION: muscle cramps    Current Outpatient Prescriptions  Medication Sig Dispense Refill  . aspirin EC 81 MG tablet Take 1 tablet (81 mg total) by mouth daily. 100 tablet 2  . benazepril (LOTENSIN) 40 MG tablet Take 1 tablet (40 mg total) by mouth daily. 30 tablet 3  . Blood Glucose Monitoring Suppl ([place]) KIT by Does not apply route 2 (two) times daily. Two times a day testing     . Calcium Carbonate-Vitamin D (CALCIUM PLUS VITAMIN D PO) Take by mouth 2 (two) times daily. Take two tablets daily     . glucose blood (ONE TOUCH ULTRA TEST) test strip Use as instructed twice daily dx 250.01 100 each 5  . glucose blood (TRUETRACK TEST) test strip 1 each by Other route 2 (two) times daily. Two times a day     . hydrochlorothiazide (HYDRODIURIL) 25 MG tablet Take 1 tablet (25 mg total) by mouth daily. 30 tablet 3  . Insulin Lispro Prot & Lispro (HUMALOG 75/25 MIX) (75-25) 100 UNIT/ML Kwikpen Inject 40 Units into the skin 2 (two) times daily. 10 pen 11    . Insulin Pen Needle (B-D UF III MINI PEN NEEDLES) 31G X 5 MM MISC USE AS DIRECTED 100 each 2  . levothyroxine (SYNTHROID, LEVOTHROID) 88 MCG tablet TAKE 1 TABLET BY MOUTH EVERY MORNING 30 tablet 6  . loratadine (CLARITIN) 10 MG tablet Take 1 tablet (10 mg total) by mouth daily. 30 tablet 11  . metFORMIN (GLUCOPHAGE) 1000 MG tablet TAKE 1 TABLET BY MOUTH TWICE DAILY WITH A MEAL 60 tablet 4  . Multiple Vitamin (MULTIVITAMIN) capsule Take 1 capsule by mouth daily.      Marland Kitchen omeprazole (PRILOSEC) 20 MG capsule TAKE 1 CAPSULE BY MOUTH EVERY MORNING 31 capsule 11  . ONETOUCH DELICA LANCETS 14G MISC Use as directed twice daily dx 250.01 100 each 5  . pravastatin (PRAVACHOL) 40 MG tablet Take 1 tablet (40 mg total) by mouth daily. 90 tablet 0   No current facility-administered medications for this visit.    Past Medical History  Diagnosis Date  . 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 Knee pain   . Obesity   . Hyperlipidemia   . Hypertension   . IDDM (insulin dependent diabetes mellitus) ([place]) 1987  . Diabetes mellitus   . BMI 30.0-30.9,adult 2012 215 LBS    2004 198 LBS  . H. pylori infection 09/24/2010    Past Surgical History  Procedure Laterality Date  .  Abdominal hysterectomy  1970  . Cholecystectomy  OCT 2010 BZ BILIARY DYSKINESIA    CHRONIC CHOLECYSTITIS  . Colonoscopy  MAY 2009 SCREENING    pTC TICS, SML IH  . Total abdominal hysterectomy  1970  . Upper gastrointestinal endoscopy  OCT 2004 RMR    DUO TIC, & ULCER  . Esophagogastroduodenoscopy  10/03/2010    Procedure: ESOPHAGOGASTRODUODENOSCOPY (EGD);  Surgeon: Dorothyann Peng, MD;  [facility];  Service: Endoscopy;  Laterality: N/A;  . Savory dilation  10/03/2010    Procedure: SAVORY DILATION;  Surgeon: Dorothyann Peng, MD;  [facility];  Service: Endoscopy;;  . Esophagogastroduodenoscopy  12/19/2002    RMR: Normal esophagus/Normal stomach/ Duodenum large bulbar diverticulum, 1 cm bulbar  ulcer with surrounding/ inflammation as described above. Normal D2    Social History   Social History  . Marital Status: Single    Spouse Name: N/A  . Number of Children: N/A  . Years of Education: N/A   Occupational History  . [employer]    Social History Main Topics  . Smoking status: Former Smoker    Types: Cigarettes    Quit date: 02/20/1985  . Smokeless tobacco: Never Used  . Alcohol Use: No  . Drug Use: No  . Sexual Activity: Not Currently   Other Topics Concern  . Not on file   Social History Narrative       Prior to Admission medications   Medication Sig Start Date End Date Taking? Authorizing Provider  aspirin EC 81 MG tablet Take 1 tablet (81 mg total) by mouth daily. 02/21/15  Yes Fayrene Helper, MD  benazepril (LOTENSIN) 40 MG tablet Take 1 tablet (40 mg total) by mouth daily. 02/11/15  Yes Fayrene Helper, MD  Blood Glucose Monitoring Suppl ([place]) KIT by Does not apply route 2 (two) times daily. Two times a day testing    Yes Historical Provider, MD  Calcium Carbonate-Vitamin D (CALCIUM PLUS VITAMIN D PO) Take by mouth 2 (two) times daily. Take two tablets daily    Yes Historical Provider, MD  glucose blood (ONE TOUCH ULTRA TEST) test strip Use as instructed twice daily dx 250.01 11/28/14  Yes Fayrene Helper, MD  glucose blood (TRUETRACK TEST) test strip 1 each by Other route 2 (two) times daily. Two times a day    Yes Historical Provider, MD  hydrochlorothiazide (HYDRODIURIL) 25 MG tablet Take 1 tablet (25 mg total) by mouth daily. 02/11/15  Yes Fayrene Helper, MD  Insulin Lispro Prot & Lispro (HUMALOG 75/25 MIX) (75-25) 100 UNIT/ML Kwikpen Inject 40 Units into the skin 2 (two) times daily. 09/04/14  Yes Fayrene Helper, MD  Insulin Pen Needle (B-D UF III MINI PEN NEEDLES) 31G X 5 MM MISC USE AS DIRECTED 02/19/15  Yes Fayrene Helper, MD  levothyroxine (SYNTHROID, LEVOTHROID) 88 MCG tablet TAKE 1 TABLET BY MOUTH EVERY MORNING  02/26/15  Yes Cassandria Anger, MD  loratadine (CLARITIN) 10 MG tablet Take 1 tablet (10 mg total) by mouth daily. 02/21/15  Yes Fayrene Helper, MD  metFORMIN (GLUCOPHAGE) 1000 MG tablet TAKE 1 TABLET BY MOUTH TWICE DAILY WITH A MEAL 12/03/14  Yes Fayrene Helper, MD  Multiple Vitamin (MULTIVITAMIN) capsule Take 1 capsule by mouth daily.     Yes Historical Provider, MD  omeprazole (PRILOSEC) 20 MG capsule TAKE 1 CAPSULE BY MOUTH EVERY MORNING 01/28/15  Yes Carlis Stable, NP  [facility] DELICA LANCETS 20B MISC Use  as directed twice daily dx 250.01 11/28/14  Yes Fayrene Helper, MD  pravastatin (PRAVACHOL) 40 MG tablet Take 1 tablet (40 mg total) by mouth daily. 12/25/14  Yes Fayrene Helper, MD     Review of systems complete and found to be negative unless listed above in HPI     Physical exam Blood pressure 136/78, pulse 99, height 5' 9" (1.753 m), weight 212 lb (96.163 kg), SpO2 100 %. General: NAD Neck: No JVD, no thyromegaly or thyroid nodule.  Lungs: Clear to auscultation bilaterally with normal respiratory effort. CV: Nondisplaced PMI. Regular rate and rhythm, normal S1/S2, no S3/S4, no murmur.  No peripheral edema.  No carotid bruit.    Abdomen: Soft, nontender, no distention.  Skin: Intact without lesions or rashes.  Neurologic: Alert and oriented x 3.  Psych: Normal affect. Extremities: No clubbing or cyanosis.  HEENT: Normal.   ECG: Most recent ECG reviewed.  Labs:   Lab Results  Component Value Date   WBC 6.9 09/24/2014   HGB 12.2 09/24/2014   HCT 37.5 09/24/2014   MCV 91.9 09/24/2014   PLT 459* 09/24/2014   No results for input(s): NA, K, CL, CO2, BUN, CREATININE, CALCIUM, PROT, BILITOT, ALKPHOS, ALT, AST, GLUCOSE in the last 168 hours.  Invalid input(s): LABALBU No results found for: CKTOTAL, CKMB, CKMBINDEX, TROPONINI  Lab Results  Component Value Date   CHOL 145 05/18/2014   CHOL 137 10/07/2013   CHOL 154 09/24/2012   Lab Results  Component  Value Date   HDL 76 05/18/2014   HDL 55 10/07/2013   HDL 58 09/24/2012   Lab Results  Component Value Date   LDLCALC 60 05/18/2014   LDLCALC 70 10/07/2013   LDLCALC 81 09/24/2012   Lab Results  Component Value Date   TRIG 47 05/18/2014   TRIG 61 10/07/2013   TRIG 75 09/24/2012   Lab Results  Component Value Date   CHOLHDL 1.9 05/18/2014   CHOLHDL 2.5 10/07/2013   CHOLHDL 2.7 09/24/2012   No results found for: LDLDIRECT       Studies: No results found.  ASSESSMENT AND PLAN:  1. Chest pain: Multiple risk factors including family history of premature CAD, hypertension, diabetes, and hyperlipidemia. Symptoms both typical and atypical in character. Has significant knee pain. Will schedule Lexiscan Cardiolite stress test. On ASA and statin therapy.  2. Essential HTN: Controlled with ACEI. No changes.  3. Hyperlipidemia: LDL 60 on statin therapy in 05/2014. Controlled.  Dispo: f/u 6 weeks.   Signed: Kate Sable, M.D., F.A.C.C.  03/27/2015, 8:43 AM

## 2015-04-02 ENCOUNTER — Inpatient Hospital Stay (HOSPITAL_COMMUNITY): Admission: RE | Admit: 2015-04-02 | Payer: Medicare Other | Source: Ambulatory Visit

## 2015-04-02 ENCOUNTER — Encounter (HOSPITAL_COMMUNITY)
Admission: RE | Admit: 2015-04-02 | Discharge: 2015-04-02 | Disposition: A | Discharge status: Home / Self Care | Payer: Medicare Other | Source: Ambulatory Visit | Attending: Cardiovascular Disease | Admitting: Cardiovascular Disease

## 2015-04-02 ENCOUNTER — Encounter (HOSPITAL_COMMUNITY): Payer: Self-pay

## 2015-04-02 DIAGNOSIS — Z8249 Family history of ischemic heart disease and other diseases of the circulatory system: Secondary | ICD-10-CM | POA: Diagnosis not present

## 2015-04-02 DIAGNOSIS — R079 Chest pain, unspecified: Secondary | ICD-10-CM

## 2015-04-02 LAB — NM MYOCAR MULTI W/SPECT W/WALL MOTION / EF
CHL CUP NUCLEAR SDS: 4
CHL CUP NUCLEAR SSS: 5
LHR: 0.28
LV dias vol: 76 mL
LV sys vol: 40 mL
Peak HR: 117 {beats}/min
Rest HR: 81 {beats}/min
SRS: 1
TID: 1

## 2015-04-02 MED ORDER — TECHNETIUM TC 99M SESTAMIBI - CARDIOLITE
30.0000 | Freq: Once | INTRAVENOUS | Status: AC | PRN
  Administered 2015-04-02: 11:00:00 30 via INTRAVENOUS

## 2015-04-02 MED ORDER — SODIUM CHLORIDE 0.9% FLUSH
INTRAVENOUS | Status: AC
  Administered 2015-04-02: 10 mL via INTRAVENOUS
  Filled 2015-04-02: qty 10

## 2015-04-02 MED ORDER — REGADENOSON 0.4 MG/5ML IV SOLN
INTRAVENOUS | Status: AC
  Administered 2015-04-02: 0.4 mg via INTRAVENOUS
  Filled 2015-04-02: qty 5

## 2015-04-02 MED ORDER — TECHNETIUM TC 99M SESTAMIBI GENERIC - CARDIOLITE
10.0000 | Freq: Once | INTRAVENOUS | Status: AC | PRN
  Administered 2015-04-02: 10 via INTRAVENOUS

## 2015-04-03 ENCOUNTER — Telehealth: Payer: Self-pay

## 2015-04-03 NOTE — Telephone Encounter (Signed)
-----   Message from Herminio Commons, MD sent at 04/03/2015  8:53 AM EST ----- Low risk.

## 2015-04-03 NOTE — Telephone Encounter (Signed)
No answer/no voicemail set up. Will try to call later to give test results.

## 2015-04-17 DIAGNOSIS — Z1159 Encounter for screening for other viral diseases: Secondary | ICD-10-CM | POA: Diagnosis not present

## 2015-04-17 DIAGNOSIS — E785 Hyperlipidemia, unspecified: Secondary | ICD-10-CM | POA: Diagnosis not present

## 2015-04-17 DIAGNOSIS — E119 Type 2 diabetes mellitus without complications: Secondary | ICD-10-CM | POA: Diagnosis not present

## 2015-04-17 DIAGNOSIS — Z794 Long term (current) use of insulin: Secondary | ICD-10-CM | POA: Diagnosis not present

## 2015-04-17 LAB — HEMOGLOBIN A1C
Hgb A1c MFr Bld: 7.7 % — ABNORMAL HIGH (ref <5.7)
Mean Plasma Glucose: 174 mg/dL — ABNORMAL HIGH (ref <117)

## 2015-04-18 ENCOUNTER — Telehealth: Payer: Self-pay | Admitting: Family Medicine

## 2015-04-18 LAB — LIPID PANEL
CHOL/HDL RATIO: 2.5 ratio (ref <=5.0)
Cholesterol: 161 mg/dL (ref 125–200)
HDL: 65 mg/dL (ref >=46)
LDL CALC: 79 mg/dL (ref <130)
Triglycerides: 86 mg/dL (ref <150)
VLDL: 17 mg/dL (ref <30)

## 2015-04-18 LAB — COMPLETE METABOLIC PANEL WITH GFR
ALT: 20 U/L (ref 6–29)
AST: 25 U/L (ref 10–35)
Albumin: 4.2 g/dL (ref 3.6–5.1)
Alkaline Phosphatase: 79 U/L (ref 33–130)
BILIRUBIN TOTAL: 0.4 mg/dL (ref 0.2–1.2)
BUN: 18 mg/dL (ref 7–25)
CO2: 29 mmol/L (ref 20–31)
Calcium: 9.5 mg/dL (ref 8.6–10.4)
Chloride: 97 mmol/L — ABNORMAL LOW (ref 98–110)
Creat: 0.96 mg/dL (ref 0.50–0.99)
GFR, EST NON AFRICAN AMERICAN: 61 mL/min (ref >=60)
GFR, Est African American: 70 mL/min (ref >=60)
GLUCOSE: 149 mg/dL — AB (ref 65–99)
Potassium: 4.7 mmol/L (ref 3.5–5.3)
SODIUM: 136 mmol/L (ref 135–146)
TOTAL PROTEIN: 7.5 g/dL (ref 6.1–8.1)

## 2015-04-18 LAB — HEPATITIS C ANTIBODY: HCV AB: NEGATIVE

## 2015-04-18 MED ORDER — PRAVASTATIN SODIUM 40 MG PO TABS: 40.0000 mg | ORAL_TABLET | Freq: Every day | ORAL | Status: DC

## 2015-04-18 NOTE — Telephone Encounter (Signed)
Med refilled.

## 2015-04-18 NOTE — Telephone Encounter (Signed)
Patient request a refill on pravastatin (PRAVACHOL) 40 MG tablet called to [facility] in [place], please advise?

## 2015-04-30 ENCOUNTER — Ambulatory Visit (INDEPENDENT_AMBULATORY_CARE_PROVIDER_SITE_OTHER): Payer: Medicare Other | Admitting: Family Medicine

## 2015-04-30 ENCOUNTER — Encounter: Payer: Self-pay | Admitting: Family Medicine

## 2015-04-30 VITALS — BP 128/78 | HR 98 | Resp 18 | Ht 69.0 in | Wt 211.0 lb

## 2015-04-30 DIAGNOSIS — I10 Essential (primary) hypertension: Secondary | ICD-10-CM | POA: Diagnosis not present

## 2015-04-30 DIAGNOSIS — E119 Type 2 diabetes mellitus without complications: Secondary | ICD-10-CM | POA: Diagnosis not present

## 2015-04-30 DIAGNOSIS — M549 Dorsalgia, unspecified: Secondary | ICD-10-CM | POA: Insufficient documentation

## 2015-04-30 DIAGNOSIS — Z1239 Encounter for other screening for malignant neoplasm of breast: Secondary | ICD-10-CM

## 2015-04-30 DIAGNOSIS — M5442 Lumbago with sciatica, left side: Secondary | ICD-10-CM

## 2015-04-30 DIAGNOSIS — Z794 Long term (current) use of insulin: Secondary | ICD-10-CM

## 2015-04-30 DIAGNOSIS — E669 Obesity, unspecified: Secondary | ICD-10-CM

## 2015-04-30 DIAGNOSIS — E890 Postprocedural hypothyroidism: Secondary | ICD-10-CM

## 2015-04-30 DIAGNOSIS — E785 Hyperlipidemia, unspecified: Secondary | ICD-10-CM

## 2015-04-30 DIAGNOSIS — IMO0001 Reserved for inherently not codable concepts without codable children: Secondary | ICD-10-CM

## 2015-04-30 MED ORDER — PREDNISONE 5 MG PO TABS: 5.0000 mg | ORAL_TABLET | Freq: Two times a day (BID) | ORAL | Status: AC

## 2015-04-30 MED ORDER — KETOROLAC TROMETHAMINE 60 MG/2ML IM SOLN
60.0000 mg | Freq: Once | INTRAMUSCULAR | Status: AC
  Administered 2015-04-30: 60 mg via INTRAMUSCULAR

## 2015-04-30 MED ORDER — IBUPROFEN 800 MG PO TABS: ORAL_TABLET | ORAL | Status: DC

## 2015-04-30 MED ORDER — METHYLPREDNISOLONE ACETATE 80 MG/ML IJ SUSP
80.0000 mg | Freq: Once | INTRAMUSCULAR | Status: AC
  Administered 2015-04-30: 80 mg via INTRAMUSCULAR

## 2015-04-30 NOTE — Patient Instructions (Addendum)
Annual physical exam first week in August , call if you need me before  Mammogram due in May, appt will be given at checkout if able  Blood pressure and labs are good  HBA1C, chem 7 and EGFR non fast end July  Injections today for back pain ,and 7 days of medication also prescribed  Thanks for choosing [facility], we consider it a privelige to serve you.

## 2015-04-30 NOTE — Progress Notes (Signed)
Subjective:    Patient ID: Stacey Hanson, female    DOB: 1945-06-25, 70 y.o.   MRN: [id number]  HPI   Stacey Hanson     MRN: [id number]      DOB: 1946-03-02   HPI Stacey Hanson is here for follow up and re-evaluation of chronic medical conditions, medication management and review of any available recent lab and radiology data.  Preventive health is updated, specifically  Cancer screening and Immunization.   Questions or concerns regarding consultations or procedures which the PT has had in the interim are  addressed. The PT denies any adverse reactions to current medications since the last visit.   week h/o increased back pain, wants injections Denies polyuria, polydipsia, blurred vision , or hypoglycemic episodes.    ROS Denies recent fever or chills. Denies sinus pressure, nasal congestion, ear pain or sore throat. Denies chest congestion, productive cough or wheezing. Denies chest pains, palpitations and leg swelling Denies abdominal pain, nausea, vomiting,diarrhea or constipation.   Denies dysuria, frequency, hesitancy or incontinence. Denies joint pain, swelling and limitation in mobility. Denies headaches, seizures, numbness, or tingling. Denies depression, anxiety or insomnia. Denies skin break down or rash.   PE  BP 128/78 mmHg  Pulse 98  Resp 18  Ht 5\' 9"  (1.753 m)  Wt 211 lb (95.709 kg)  BMI 31.15 kg/m2  SpO2 100%  Patient alert and oriented and in no cardiopulmonary distress.  HEENT: No facial asymmetry, EOMI,   oropharynx pink and moist.  Neck supple no JVD, no mass.  Chest: Clear to auscultation bilaterally.  CVS: S1, S2 no murmurs, no S3.Regular rate.  ABD: Soft non tender.   Ext: No edema  MS: decreased ROM lumbar spine, normal in hips, knees and shoulders  Skin: Intact, no ulcerations or rash noted.  Psych: Good eye contact, normal affect. Memory intact not anxious or depressed appearing.  CNS: CN 2-12 intact, power,  normal throughout.no  focal deficits noted.   Assessment & Plan   ESSENTIAL HYPERTENSION, BENIGN Controlled, no change in medication DASH diet and commitment to daily physical activity for a minimum of 30 minutes discussed and encouraged, as a part of hypertension management. The importance of attaining a healthy weight is also discussed.  BP/Weight 04/30/2015 03/27/2015 02/21/2015 02/07/2015 01/03/2015 Q000111Q XX123456  Systolic BP 0000000 [id number] 0000000 Q000111Q Q000111Q  Diastolic BP 78 78 64 80 76 70 73  Wt. (Lbs) 211 212 209 210 208 210.04 208  BMI 31.15 31.29 30.85 31.94 30.7 31 30.7        Diabetes mellitus, insulin dependent (IDDM), controlled Deteriorated, though still well controlled Stacey Hanson is reminded of the importance of commitment to daily physical activity for 30 minutes or more, as able and the need to limit carbohydrate intake to 30 to 60 grams per meal to help with blood sugar control.   The need to take medication as prescribed, test blood sugar as directed, and to call between visits if there is a concern that blood sugar is uncontrolled is also discussed.   Stacey Hanson is reminded of the importance of daily foot exam, annual eye examination, and good blood sugar, blood pressure and cholesterol control.  Diabetic Labs Latest Ref Rng 04/17/2015 01/14/2015 09/24/2014 05/18/2014 02/19/2014  HbA1c <5.7 % 7.7(H) 7.8(H) 6.7(H) 6.9(H) -  Microalbumin <2.0 mg/dL - - - - 0.2  Micro/Creat Ratio 0.0 - 30.0 mg/g - - - - 3.8  Chol 125 - 200 mg/dL 161 - - 145 -  HDL >=46 mg/dL 65 - - 76 -  Calc LDL <130 mg/dL 79 - - 60 -  Triglycerides <150 mg/dL 86 - - 47 -  Creatinine 0.50 - 0.99 mg/dL 0.96 0.77 0.86 0.91 -   BP/Weight 04/30/2015 03/27/2015 02/21/2015 02/07/2015 01/03/2015 Q000111Q XX123456  Systolic BP 0000000 [id number] 0000000 Q000111Q Q000111Q  Diastolic BP 78 78 64 80 76 70 73  Wt. (Lbs) 211 212 209 210 208 210.04 208  BMI 31.15 31.29 30.85 31.94 30.7 31 30.7   Foot/eye exam completion dates Latest Ref Rng  09/27/2014 09/08/2013  Eye Exam No Retinopathy - No Retinopathy  Foot exam Order - - -  Foot Form Completion - Done -         Back pain Uncontrolled.Toradol and depo medrol administered IM in the office , to be followed by a short course of oral prednisone and NSAIDS.   Obesity unchanged. Patient re-educated about  the importance of commitment to a  minimum of 150 minutes of exercise per week.  The importance of healthy food choices with portion control discussed. Encouraged to start a food diary, count calories and to consider  joining a support group. Sample diet sheets offered. Goals set by the patient for the next several months.   Weight /BMI 04/30/2015 03/27/2015 02/21/2015  WEIGHT 211 lb 212 lb 209 lb  HEIGHT 5\' 9"  5\' 9"  5\' 9"   BMI 31.15 kg/m2 31.29 kg/m2 30.85 kg/m2    Current exercise per week 90 minutes.   Hyperlipidemia LDL goal <100 Hyperlipidemia:Low fat diet discussed and encouraged.   Lipid Panel  Lab Results  Component Value Date   CHOL 161 04/17/2015   HDL 65 04/17/2015   LDLCALC 79 04/17/2015   TRIG 86 04/17/2015   CHOLHDL 2.5 04/17/2015  Controlled, no change in medication      Hypothyroidism, postradioiodine therapy Asymptomatic, followed annually by endo       Review of Systems     Objective:   Physical Exam        Assessment & Plan:

## 2015-05-05 NOTE — Assessment & Plan Note (Signed)
Hyperlipidemia:Low fat diet discussed and encouraged.   Lipid Panel  Lab Results  Component Value Date   CHOL 161 04/17/2015   HDL 65 04/17/2015   LDLCALC 79 04/17/2015   TRIG 86 04/17/2015   CHOLHDL 2.5 04/17/2015  Controlled, no change in medication

## 2015-05-05 NOTE — Assessment & Plan Note (Signed)
Controlled, no change in medication DASH diet and commitment to daily physical activity for a minimum of 30 minutes discussed and encouraged, as a part of hypertension management. The importance of attaining a healthy weight is also discussed.  BP/Weight 04/30/2015 03/27/2015 02/21/2015 02/07/2015 01/03/2015 Q000111Q XX123456  Systolic BP 0000000 [id number] 0000000 Q000111Q Q000111Q  Diastolic BP 78 78 64 80 76 70 73  Wt. (Lbs) 211 212 209 210 208 210.04 208  BMI 31.15 31.29 30.85 31.94 30.7 31 30.7

## 2015-05-05 NOTE — Assessment & Plan Note (Signed)
Asymptomatic, followed annually by endo

## 2015-05-05 NOTE — Assessment & Plan Note (Signed)
Uncontrolled.Toradol and depo medrol administered IM in the office , to be followed by a short course of oral prednisone and NSAIDS.  

## 2015-05-05 NOTE — Assessment & Plan Note (Signed)
Deteriorated, though still well controlled Stacey Hanson is reminded of the importance of commitment to daily physical activity for 30 minutes or more, as able and the need to limit carbohydrate intake to 30 to 60 grams per meal to help with blood sugar control.   The need to take medication as prescribed, test blood sugar as directed, and to call between visits if there is a concern that blood sugar is uncontrolled is also discussed.   Stacey Hanson is reminded of the importance of daily foot exam, annual eye examination, and good blood sugar, blood pressure and cholesterol control.  Diabetic Labs Latest Ref Rng 04/17/2015 01/14/2015 09/24/2014 05/18/2014 02/19/2014  HbA1c <5.7 % 7.7(H) 7.8(H) 6.7(H) 6.9(H) -  Microalbumin <2.0 mg/dL - - - - 0.2  Micro/Creat Ratio 0.0 - 30.0 mg/g - - - - 3.8  Chol 125 - 200 mg/dL 161 - - 145 -  HDL >=46 mg/dL 65 - - 76 -  Calc LDL <130 mg/dL 79 - - 60 -  Triglycerides <150 mg/dL 86 - - 47 -  Creatinine 0.50 - 0.99 mg/dL 0.96 0.77 0.86 0.91 -   BP/Weight 04/30/2015 03/27/2015 02/21/2015 02/07/2015 01/03/2015 Q000111Q XX123456  Systolic BP 0000000 [id number] 0000000 Q000111Q Q000111Q  Diastolic BP 78 78 64 80 76 70 73  Wt. (Lbs) 211 212 209 210 208 210.04 208  BMI 31.15 31.29 30.85 31.94 30.7 31 30.7   Foot/eye exam completion dates Latest Ref Rng 09/27/2014 09/08/2013  Eye Exam No Retinopathy - No Retinopathy  Foot exam Order - - -  Foot Form Completion - Done -

## 2015-05-05 NOTE — Assessment & Plan Note (Signed)
unchanged. Patient re-educated about  the importance of commitment to a  minimum of 150 minutes of exercise per week.  The importance of healthy food choices with portion control discussed. Encouraged to start a food diary, count calories and to consider  joining a support group. Sample diet sheets offered. Goals set by the patient for the next several months.   Weight /BMI 04/30/2015 03/27/2015 02/21/2015  WEIGHT 211 lb 212 lb 209 lb  HEIGHT 5\' 9"  5\' 9"  5\' 9"   BMI 31.15 kg/m2 31.29 kg/m2 30.85 kg/m2    Current exercise per week 90 minutes.

## 2015-05-06 ENCOUNTER — Telehealth: Payer: Self-pay | Admitting: Family Medicine

## 2015-05-06 ENCOUNTER — Other Ambulatory Visit: Payer: Self-pay

## 2015-05-06 MED ORDER — METFORMIN HCL 1000 MG PO TABS: ORAL_TABLET | ORAL | Status: DC

## 2015-05-06 NOTE — Telephone Encounter (Signed)
Patient is asking for a refill on her metFORMIN (GLUCOPHAGE) 1000 MG tablet  Please advise?

## 2015-05-06 NOTE — Telephone Encounter (Signed)
Medication refilled

## 2015-05-08 ENCOUNTER — Encounter: Payer: Self-pay | Admitting: Cardiovascular Disease

## 2015-05-08 ENCOUNTER — Ambulatory Visit (INDEPENDENT_AMBULATORY_CARE_PROVIDER_SITE_OTHER): Payer: Medicare Other | Admitting: Cardiovascular Disease

## 2015-05-08 VITALS — BP 136/70 | HR 92 | Ht 69.0 in | Wt 212.6 lb

## 2015-05-08 DIAGNOSIS — I10 Essential (primary) hypertension: Secondary | ICD-10-CM | POA: Diagnosis not present

## 2015-05-08 DIAGNOSIS — R079 Chest pain, unspecified: Secondary | ICD-10-CM | POA: Diagnosis not present

## 2015-05-08 DIAGNOSIS — E785 Hyperlipidemia, unspecified: Secondary | ICD-10-CM

## 2015-05-08 DIAGNOSIS — Z8249 Family history of ischemic heart disease and other diseases of the circulatory system: Secondary | ICD-10-CM | POA: Diagnosis not present

## 2015-05-08 NOTE — Progress Notes (Signed)
Patient ID: Stacey Hanson, female   DOB: 12/10/1945, 70 y.o.   MRN: [id number]      SUBJECTIVE: The patient returns for follow-up after undergoing cardiovascular testing performed for the evaluation of chest pain. Nuclear stress testing imaging was of poor quality due to soft tissue attenuation artifact. However, there were no gross ischemic territories and it was a low risk study overall.  She has not had any chest pain recurrence and has begun exercising.   Review of Systems: As per "subjective", otherwise negative.  Allergies  Allergen Reactions  . Simvastatin     REACTION: muscle cramps    Current Outpatient Prescriptions  Medication Sig Dispense Refill  . aspirin EC 81 MG tablet Take 1 tablet (81 mg total) by mouth daily. 100 tablet 2  . benazepril (LOTENSIN) 40 MG tablet Take 1 tablet (40 mg total) by mouth daily. 30 tablet 3  . Blood Glucose Monitoring Suppl ([place]) KIT by Does not apply route 2 (two) times daily. Two times a day testing     . Calcium Carbonate-Vitamin D (CALCIUM PLUS VITAMIN D PO) Take by mouth 2 (two) times daily. Take two tablets daily     . glucose blood (ONE TOUCH ULTRA TEST) test strip Use as instructed twice daily dx 250.01 100 each 5  . glucose blood (TRUETRACK TEST) test strip 1 each by Other route 2 (two) times daily. Two times a day     . hydrochlorothiazide (HYDRODIURIL) 25 MG tablet Take 1 tablet (25 mg total) by mouth daily. 30 tablet 3  . ibuprofen (ADVIL,MOTRIN) 800 MG tablet One tablet twice daily for 1 week 14 tablet 0  . Insulin Lispro Prot & Lispro (HUMALOG 75/25 MIX) (75-25) 100 UNIT/ML Kwikpen Inject 40 Units into the skin 2 (two) times daily. 10 pen 11  . Insulin Pen Needle (B-D UF III MINI PEN NEEDLES) 31G X 5 MM MISC USE AS DIRECTED 100 each 2  . levothyroxine (SYNTHROID, LEVOTHROID) 88 MCG tablet TAKE 1 TABLET BY MOUTH EVERY MORNING 30 tablet 6  . loratadine (CLARITIN) 10 MG tablet Take 1 tablet (10 mg total) by mouth  daily. 30 tablet 11  . metFORMIN (GLUCOPHAGE) 1000 MG tablet TAKE 1 TABLET BY MOUTH TWICE DAILY WITH A MEAL 60 tablet 4  . Multiple Vitamin (MULTIVITAMIN) capsule Take 1 capsule by mouth daily.      Marland Kitchen omeprazole (PRILOSEC) 20 MG capsule TAKE 1 CAPSULE BY MOUTH EVERY MORNING 31 capsule 11  . ONETOUCH DELICA LANCETS 38U MISC Use as directed twice daily dx 250.01 100 each 5  . pravastatin (PRAVACHOL) 40 MG tablet Take 1 tablet (40 mg total) by mouth daily. 90 tablet 1   No current facility-administered medications for this visit.    Past Medical History  Diagnosis Date  . 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 Knee pain   . Obesity   . Hyperlipidemia   . Hypertension   . IDDM (insulin dependent diabetes mellitus) ([place]) 1987  . Diabetes mellitus   . BMI 30.0-30.9,adult 2012 215 LBS    2004 198 LBS  . H. pylori infection 09/24/2010    Past Surgical History  Procedure Laterality Date  . Abdominal hysterectomy  1970  . Cholecystectomy  OCT 2010 BZ BILIARY DYSKINESIA    CHRONIC CHOLECYSTITIS  . Colonoscopy  MAY 2009 SCREENING    pTC TICS, SML IH  . Total abdominal hysterectomy  1970  . Upper gastrointestinal endoscopy  OCT  2004 RMR    DUO TIC, & ULCER  . Esophagogastroduodenoscopy  10/03/2010    Procedure: ESOPHAGOGASTRODUODENOSCOPY (EGD);  Surgeon: Dorothyann Peng, MD;  Location: [facility];  Service: Endoscopy;  Laterality: N/A;  . Savory dilation  10/03/2010    Procedure: SAVORY DILATION;  Surgeon: Dorothyann Peng, MD;  Location: [facility];  Service: Endoscopy;;  . Esophagogastroduodenoscopy  12/19/2002    RMR: Normal esophagus/Normal stomach/ Duodenum large bulbar diverticulum, 1 cm bulbar ulcer with surrounding/ inflammation as described above. Normal D2    Social History   Social History  . Marital Status: Single    Spouse Name: N/A  . Number of Children: N/A  . Years of Education: N/A   Occupational History  . [employer]    Social History Main  Topics  . Smoking status: Former Smoker    Types: Cigarettes    Quit date: 02/20/1985  . Smokeless tobacco: Never Used  . Alcohol Use: No  . Drug Use: No  . Sexual Activity: Not Currently   Other Topics Concern  . Not on file   Social History Narrative     Filed Vitals:   05/08/15 1302  BP: 136/70  Pulse: 92  Height: '5\' 9"'  (1.753 m)  Weight: 212 lb 9.6 oz (96.435 kg)  SpO2: 97%    PHYSICAL EXAM General: NAD HEENT: Normal. Neck: No JVD, no thyromegaly. Lungs: Clear to auscultation bilaterally with normal respiratory effort. CV: Nondisplaced PMI.  Regular rate and rhythm, normal S1/S2, no S3/S4, no murmur. No pretibial or periankle edema.   Abdomen: Soft, nontender, no distention.  Neurologic: Alert and oriented.  Psych: Normal affect. Skin: Normal. Musculoskeletal: No gross deformities.  ECG: Most recent ECG reviewed.      ASSESSMENT AND PLAN: 1. Chest pain: No recurrences. Multiple risk factors including family history of premature CAD, hypertension, diabetes, and hyperlipidemia. Symptoms both typical and atypical in character.  Lexiscan Cardiolite stress test was low risk. On ASA and statin therapy. No further testing indicated at this time.  2. Essential HTN: Controlled with ACEI. No changes.  3. Hyperlipidemia: LDL 60 on statin therapy in 05/2014. Controlled.  Dispo: f/u prn.  Kate Sable, M.D., F.A.C.C.

## 2015-05-08 NOTE — Patient Instructions (Signed)
Medication Instructions:  Your physician recommends that you continue on your current medications as directed. Please refer to the Current Medication list given to you today.   Labwork: NONE  Testing/Procedures: NONE  Follow-Up: Your physician recommends that you schedule a follow-up appointment in: AS NEEDED      Any Other Special Instructions Will Be Listed Below (If Applicable).     If you need a refill on your cardiac medications before your next appointment, please call your pharmacy.   

## 2015-06-18 ENCOUNTER — Other Ambulatory Visit: Payer: Self-pay | Admitting: Family Medicine

## 2015-07-10 ENCOUNTER — Ambulatory Visit (HOSPITAL_COMMUNITY)
Admission: RE | Admit: 2015-07-10 | Discharge: 2015-07-10 | Disposition: A | Discharge status: Home / Self Care | Payer: Medicare Other | Source: Ambulatory Visit | Attending: Family Medicine | Admitting: Family Medicine

## 2015-07-10 DIAGNOSIS — Z1231 Encounter for screening mammogram for malignant neoplasm of breast: Secondary | ICD-10-CM | POA: Diagnosis not present

## 2015-07-10 DIAGNOSIS — Z1239 Encounter for other screening for malignant neoplasm of breast: Secondary | ICD-10-CM | POA: Diagnosis not present

## 2015-07-17 DIAGNOSIS — I10 Essential (primary) hypertension: Secondary | ICD-10-CM | POA: Diagnosis not present

## 2015-07-17 DIAGNOSIS — E785 Hyperlipidemia, unspecified: Secondary | ICD-10-CM | POA: Diagnosis not present

## 2015-07-17 DIAGNOSIS — E032 Hypothyroidism due to medicaments and other exogenous substances: Secondary | ICD-10-CM | POA: Diagnosis not present

## 2015-07-17 DIAGNOSIS — E119 Type 2 diabetes mellitus without complications: Secondary | ICD-10-CM | POA: Diagnosis not present

## 2015-07-17 LAB — HEMOGLOBIN A1C: HEMOGLOBIN A1C: 7.4

## 2015-07-19 ENCOUNTER — Other Ambulatory Visit: Payer: Self-pay

## 2015-07-19 ENCOUNTER — Telehealth: Payer: Self-pay | Admitting: Family Medicine

## 2015-07-19 MED ORDER — INSULIN PEN NEEDLE 31G X 5 MM MISC: Status: DC

## 2015-07-19 MED ORDER — INSULIN PEN NEEDLE 31G X 5 MM MISC
Status: DC
Start: 2015-07-19 — End: 2015-07-19

## 2015-07-19 NOTE — Telephone Encounter (Signed)
Patient is asking for a refill on her BD Needles for her Pen called to [facility], please advise?

## 2015-07-19 NOTE — Telephone Encounter (Signed)
Needles refilled

## 2015-07-24 ENCOUNTER — Ambulatory Visit (INDEPENDENT_AMBULATORY_CARE_PROVIDER_SITE_OTHER): Payer: Medicare Other | Admitting: "Endocrinology

## 2015-07-24 ENCOUNTER — Encounter: Payer: Self-pay | Admitting: "Endocrinology

## 2015-07-24 VITALS — BP 147/82 | HR 72 | Ht 69.0 in | Wt 214.0 lb

## 2015-07-24 DIAGNOSIS — Z794 Long term (current) use of insulin: Secondary | ICD-10-CM | POA: Diagnosis not present

## 2015-07-24 DIAGNOSIS — N183 Chronic kidney disease, stage 3 (moderate): Secondary | ICD-10-CM | POA: Diagnosis not present

## 2015-07-24 DIAGNOSIS — E785 Hyperlipidemia, unspecified: Secondary | ICD-10-CM

## 2015-07-24 DIAGNOSIS — E1122 Type 2 diabetes mellitus with diabetic chronic kidney disease: Secondary | ICD-10-CM

## 2015-07-24 DIAGNOSIS — E1165 Type 2 diabetes mellitus with hyperglycemia: Secondary | ICD-10-CM | POA: Diagnosis not present

## 2015-07-24 DIAGNOSIS — IMO0002 Reserved for concepts with insufficient information to code with codable children: Secondary | ICD-10-CM

## 2015-07-24 DIAGNOSIS — I10 Essential (primary) hypertension: Secondary | ICD-10-CM

## 2015-07-24 DIAGNOSIS — E890 Postprocedural hypothyroidism: Secondary | ICD-10-CM | POA: Diagnosis not present

## 2015-07-24 NOTE — Patient Instructions (Signed)

## 2015-07-24 NOTE — Progress Notes (Signed)
Subjective:    Patient ID: Stacey Hanson, female    DOB: Nov 04, 1945, PCP Tula Nakayama, MD   Past Medical History  Diagnosis Date  . 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 Knee pain   . Obesity   . Hyperlipidemia   . Hypertension   . IDDM (insulin dependent diabetes mellitus) ([place]) 1987  . Diabetes mellitus   . BMI 30.0-30.9,adult 2012 215 LBS    2004 198 LBS  . H. pylori infection 09/24/2010   Past Surgical History  Procedure Laterality Date  . Abdominal hysterectomy  1970  . Cholecystectomy  OCT 2010 BZ BILIARY DYSKINESIA    CHRONIC CHOLECYSTITIS  . Colonoscopy  MAY 2009 SCREENING    pTC TICS, SML IH  . Total abdominal hysterectomy  1970  . Upper gastrointestinal endoscopy  OCT 2004 RMR    DUO TIC, & ULCER  . Esophagogastroduodenoscopy  10/03/2010    Procedure: ESOPHAGOGASTRODUODENOSCOPY (EGD);  Surgeon: Dorothyann Peng, MD;  [facility];  Service: Endoscopy;  Laterality: N/A;  . Savory dilation  10/03/2010    Procedure: SAVORY DILATION;  Surgeon: Dorothyann Peng, MD;  Location: [facility];  Service: Endoscopy;;  . Esophagogastroduodenoscopy  12/19/2002    RMR: Normal esophagus/Normal stomach/ Duodenum large bulbar diverticulum, 1 cm bulbar ulcer with surrounding/ inflammation as described above. Normal D2   Social History   Social History  . Marital Status: Single    Spouse Name: N/A  . Number of Children: N/A  . Years of Education: N/A   Occupational History  . [employer]    Social History Main Topics  . Smoking status: Former Smoker    Types: Cigarettes    Quit date: 02/20/1985  . Smokeless tobacco: Never Used  . Alcohol Use: No  . Drug Use: No  . Sexual Activity: Not Currently   Other Topics Concern  . None   Social History Narrative   Outpatient Encounter Prescriptions as of 07/24/2015  Medication Sig  . aspirin EC 81 MG tablet Take 1 tablet (81 mg total) by mouth daily.  . benazepril (LOTENSIN) 40 MG tablet TAKE 1  TABLET(40 MG) BY MOUTH DAILY  . Calcium Carbonate-Vitamin D (CALCIUM PLUS VITAMIN D PO) Take by mouth 2 (two) times daily. Take two tablets daily   . hydrochlorothiazide (HYDRODIURIL) 25 MG tablet TAKE 1 TABLET(25 MG) BY MOUTH DAILY  . ibuprofen (ADVIL,MOTRIN) 800 MG tablet One tablet twice daily for 1 week  . Insulin Lispro Prot & Lispro (HUMALOG 75/25 MIX) (75-25) 100 UNIT/ML Kwikpen Inject 40 Units into the skin 2 (two) times daily.  Marland Kitchen levothyroxine (SYNTHROID, LEVOTHROID) 88 MCG tablet TAKE 1 TABLET BY MOUTH EVERY MORNING  . metFORMIN (GLUCOPHAGE) 500 MG tablet Take 500 mg by mouth 2 (two) times daily with a meal.  . Multiple Vitamin (MULTIVITAMIN) capsule Take 1 capsule by mouth daily.    Marland Kitchen omeprazole (PRILOSEC) 20 MG capsule TAKE 1 CAPSULE BY MOUTH EVERY MORNING  . pravastatin (PRAVACHOL) 40 MG tablet Take 1 tablet (40 mg total) by mouth daily.  . [DISCONTINUED] metFORMIN (GLUCOPHAGE) 1000 MG tablet TAKE 1 TABLET BY MOUTH TWICE DAILY WITH A MEAL  . Blood Glucose Monitoring Suppl ([place]) KIT by Does not apply route 2 (two) times daily. Two times a day testing   . glucose blood (ONE TOUCH ULTRA TEST) test strip Use as instructed twice daily dx 250.01  . glucose blood (TRUETRACK TEST) test strip 1  each by Other route 2 (two) times daily. Two times a day   . Insulin Pen Needle (B-D UF III MINI PEN NEEDLES) 31G X 5 MM MISC For use twice daily with injection of Humalog  . loratadine (CLARITIN) 10 MG tablet Take 1 tablet (10 mg total) by mouth daily.  Glory Rosebush DELICA LANCETS 50T MISC Use as directed twice daily dx 250.01   No facility-administered encounter medications on file as of 07/24/2015.   ALLERGIES: Allergies  Allergen Reactions  . Simvastatin     REACTION: muscle cramps   VACCINATION STATUS: Immunization History  Administered Date(s) Administered  . Influenza,inj,Quad PF,36+ Mos 11/16/2012, 11/15/2013, 11/14/2014  . Pneumococcal Conjugate-13 10/11/2013  .  Pneumococcal Polysaccharide-23 10/07/2011  . Zoster 04/05/2006    HPI 70 yr old female with medical hx of type 2 DM, HTN,  RAI induced hypothyroidism, Obesity.   She was given RAI for toxic  On 10/17/2010. She has hx of multinodular goiter s/p  FNA was benign. She returns for f/u after a long absence.  She denies heat intolerance/ cold intolerance. She has steady weight.  no dysphagia, SOB, nor voice change. she iso n Lt4 59mg po qam. She is on  Humalog 75/25 35 units twice a day for type 2 diabetes along with metformin 1000 g by mouth twice a day. Her A1c is 7.4%.  Review of Systems  Constitutional: Positive for fatigue. Negative for fever, chills and unexpected weight change.  HENT: Negative for trouble swallowing and voice change.   Eyes: Negative for visual disturbance.  Respiratory: Negative for cough, shortness of breath and wheezing.   Cardiovascular: Negative for chest pain, palpitations and leg swelling.  Gastrointestinal: Negative for nausea, vomiting and diarrhea.  Endocrine: Negative for cold intolerance, heat intolerance, polydipsia, polyphagia and polyuria.  Musculoskeletal: Negative for myalgias and arthralgias.  Skin: Negative for color change, pallor, rash and wound.  Neurological: Negative for seizures and headaches.  Psychiatric/Behavioral: Negative for suicidal ideas and confusion.    Objective:    BP 147/82 mmHg  Pulse 72  Ht _0  (1.753 m)  Wt 214 lb (97.07 kg)  BMI 31.59 kg/m2  SpO2 96%  Wt Readings from Last 3 Encounters:  07/24/15 214 lb (97.07 kg)  05/08/15 212 lb 9.6 oz (96.435 kg)  04/30/15 211 lb (95.709 kg)    Physical Exam  Constitutional: She is oriented to person, place, and time. She appears well-developed.  HENT:  Head: Normocephalic and atraumatic.  Eyes: EOM are normal.  Neck: Normal range of motion. Neck supple. No tracheal deviation present. No thyromegaly present.  Cardiovascular: Normal rate and regular rhythm.    Pulmonary/Chest: Effort normal and breath sounds normal.  Abdominal: Soft. Bowel sounds are normal. There is no tenderness. There is no guarding.  Musculoskeletal: Normal range of motion. She exhibits no edema.  Neurological: She is alert and oriented to person, place, and time. She has normal reflexes. No cranial nerve deficit. Coordination normal.  Skin: Skin is warm and dry. No rash noted. No erythema. No pallor.  Psychiatric: She has a normal mood and affect. Judgment normal.    CMP     Component Value Date/Time   NA 136 04/17/2015 0842   K 4.7 04/17/2015 0842   CL 97* 04/17/2015 0842   CO2 29 04/17/2015 0842   GLUCOSE 149* 04/17/2015 0842   BUN 18 04/17/2015 0842   CREATININE 0.96 04/17/2015 0842   CREATININE 0.83 04/19/2010 2109   CALCIUM 9.5 04/17/2015 0842   PROT  7.5 04/17/2015 0842   ALBUMIN 4.2 04/17/2015 0842   AST 25 04/17/2015 0842   ALT 20 04/17/2015 0842   ALKPHOS 79 04/17/2015 0842   BILITOT 0.4 04/17/2015 0842   GFRNONAA 61 04/17/2015 0842   GFRNONAA >60 12/19/2008 1450   GFRAA 70 04/17/2015 0842   GFRAA  12/19/2008 1450    >60        The eGFR has been calculated using the MDRD equation. This calculation has not been validated in all clinical situations. eGFR's persistently <60 mL/min signify possible Chronic Kidney Disease.     Diabetic Labs (most recent): Lab Results  Component Value Date   HGBA1C 7.4 07/17/2015   HGBA1C 7.7* 04/17/2015   HGBA1C 7.8* 01/14/2015     Lipid Panel ( most recent) Lipid Panel     Component Value Date/Time   CHOL 161 04/17/2015 0842   TRIG 86 04/17/2015 0842   HDL 65 04/17/2015 0842   CHOLHDL 2.5 04/17/2015 0842   VLDL 17 04/17/2015 0842   LDLCALC 79 04/17/2015 0842    On 07/17/2015 her labs showed TSH 1.16, free T4 1.3  Assessment & Plan:    1. Hypothyroidism, postradioiodine therapy  - Her thyroid function tests are consistent with appropriate replacement. I have advised her to continue levothyroxine  88 g by mouth every morning.   - We discussed about correct intake of levothyroxine, at fasting, with water, separated by at least 30 minutes from breakfast, and separated by more than 4 hours from calcium, iron, multivitamins, acid reflux medications (PPIs). -Patient is made aware of the fact that thyroid hormone replacement is needed for life, dose to be adjusted by periodic monitoring of thyroid function tests.   2. Uncontrolled type 2 diabetes mellitus with stage 3 chronic kidney disease, with long-term current use of insulin (HCC)  - Her A1c is controlled at 7.4%. I advised patient to continue Humalog 75/25  35 units with breakfast and supper when pre-meal blood glucose is above 90 mg/dL. I have Given her detailed carbs and exercise regimen. I advised her to lower metformin to 500 mg by mouth twice a day given rising serum creatinine and declining GFR.  3. Essential hypertension, benign - Uncontrolled, I advised her to be consistent in her blood pressure medications including been as a previous 40 mg by mouth daily and hydrochlorothiazide 25 mg by mouth daily. Salt restriction advised.   4. Hyperlipidemia:  I advised her to continue pravastatin 40 mg by mouth daily at bedtime. Side effects and precautions discussed with her. -She will need fasting lipid panel on subsequent visits.  - 25 minutes of time was spent on the care of this patient , 50% of which was applied for counseling on diabetes complications and their preventions.  - I advised patient to maintain close follow up with Tula Nakayama, MD for primary care needs. Follow up plan: Return in about 3 months (around 10/24/2015) for diabetes, high blood pressure, high cholesterol, underactive thyroid, follow up with pre-visit labs, meter, and logs.  Glade Lloyd, MD Phone: [phone]  Fax: [phone]   07/24/2015, 4:01 PM

## 2015-08-19 ENCOUNTER — Other Ambulatory Visit: Payer: Self-pay | Admitting: "Endocrinology

## 2015-09-15 ENCOUNTER — Other Ambulatory Visit: Payer: Self-pay | Admitting: Family Medicine

## 2015-09-19 ENCOUNTER — Other Ambulatory Visit: Payer: Self-pay | Admitting: Family Medicine

## 2015-09-21 ENCOUNTER — Other Ambulatory Visit: Payer: Self-pay | Admitting: Family Medicine

## 2015-09-28 DIAGNOSIS — E119 Type 2 diabetes mellitus without complications: Secondary | ICD-10-CM | POA: Diagnosis not present

## 2015-09-28 DIAGNOSIS — Z794 Long term (current) use of insulin: Secondary | ICD-10-CM | POA: Diagnosis not present

## 2015-09-28 LAB — COMPLETE METABOLIC PANEL WITH GFR
ALBUMIN: 4.3 g/dL (ref 3.6–5.1)
ALT: 18 U/L (ref 6–29)
AST: 22 U/L (ref 10–35)
Alkaline Phosphatase: 88 U/L (ref 33–130)
BUN: 22 mg/dL (ref 7–25)
CALCIUM: 9.6 mg/dL (ref 8.6–10.4)
CO2: 23 mmol/L (ref 20–31)
CREATININE: 1.03 mg/dL — AB (ref 0.60–0.93)
Chloride: 101 mmol/L (ref 98–110)
GFR, Est African American: 64 mL/min (ref >=60)
GFR, Est Non African American: 55 mL/min — ABNORMAL LOW (ref >=60)
Glucose, Bld: 166 mg/dL — ABNORMAL HIGH (ref 65–99)
POTASSIUM: 4.8 mmol/L (ref 3.5–5.3)
Sodium: 135 mmol/L (ref 135–146)
Total Bilirubin: 0.4 mg/dL (ref 0.2–1.2)
Total Protein: 7.2 g/dL (ref 6.1–8.1)

## 2015-09-29 LAB — HEMOGLOBIN A1C
Hgb A1c MFr Bld: 8 % — ABNORMAL HIGH (ref <5.7)
Mean Plasma Glucose: 183 mg/dL

## 2015-10-02 DIAGNOSIS — H52223 Regular astigmatism, bilateral: Secondary | ICD-10-CM | POA: Diagnosis not present

## 2015-10-02 DIAGNOSIS — E119 Type 2 diabetes mellitus without complications: Secondary | ICD-10-CM | POA: Diagnosis not present

## 2015-10-02 DIAGNOSIS — H524 Presbyopia: Secondary | ICD-10-CM | POA: Diagnosis not present

## 2015-10-02 DIAGNOSIS — H5201 Hypermetropia, right eye: Secondary | ICD-10-CM | POA: Diagnosis not present

## 2015-10-03 ENCOUNTER — Encounter: Payer: Self-pay | Admitting: Family Medicine

## 2015-10-03 ENCOUNTER — Ambulatory Visit (INDEPENDENT_AMBULATORY_CARE_PROVIDER_SITE_OTHER): Payer: Medicare Other | Admitting: Family Medicine

## 2015-10-03 VITALS — BP 122/70 | HR 100 | Resp 18 | Ht 69.0 in | Wt 213.0 lb

## 2015-10-03 DIAGNOSIS — E119 Type 2 diabetes mellitus without complications: Secondary | ICD-10-CM

## 2015-10-03 DIAGNOSIS — Z794 Long term (current) use of insulin: Secondary | ICD-10-CM

## 2015-10-03 DIAGNOSIS — IMO0001 Reserved for inherently not codable concepts without codable children: Secondary | ICD-10-CM

## 2015-10-03 DIAGNOSIS — I10 Essential (primary) hypertension: Secondary | ICD-10-CM

## 2015-10-03 DIAGNOSIS — Z0000 Encounter for general adult medical examination without abnormal findings: Secondary | ICD-10-CM | POA: Diagnosis not present

## 2015-10-03 DIAGNOSIS — E890 Postprocedural hypothyroidism: Secondary | ICD-10-CM

## 2015-10-03 DIAGNOSIS — E669 Obesity, unspecified: Secondary | ICD-10-CM

## 2015-10-03 MED ORDER — METFORMIN HCL 1000 MG PO TABS: 1000.0000 mg | ORAL_TABLET | Freq: Two times a day (BID) | ORAL | 3 refills | Status: DC

## 2015-10-03 NOTE — Assessment & Plan Note (Addendum)
Deteriorated. Increase insulin to 37 units twice daily and see diabetic educator 10 minutes spent counselling pt re carb counting, importance of testing at least 3 times daily and reviewed blood sugar goals and also presented them in writing to her. She is to call weekly or more frequently if blood sugars are uncontrolled

## 2015-10-03 NOTE — Patient Instructions (Addendum)
F/u in 3 month, call if you need me sooner  INCREASE insulin to 37 units twice daily  Fasting/ morning blood sugar should be between 90 to 140, and bedtime blood sugar between 140 to 180  You are referred to educator to help with your  Diet, her office is at Dr. Liliane Channel office  Fasting lipdi, cmp and EGFR, cbc and vit D in 3 months  Thank you  for choosing [facility]. We consider it a privelige to serve you.  Delivering excellent health care in a caring and  compassionate way is our goal.  Partnering with you,  so that together we can achieve this goal is our strategy.

## 2015-10-03 NOTE — Assessment & Plan Note (Signed)

## 2015-10-06 ENCOUNTER — Encounter: Payer: Self-pay | Admitting: Family Medicine

## 2015-10-06 NOTE — Progress Notes (Signed)
    Stacey Hanson     MRN: [id number]      DOB: [birth year]  HPI: Patient is in for annual physical exam. No other health concerns are expressed or addressed at the visit. Recent labs, if available are reviewed. Immunization is reviewed , and  updated if needed.   PE: Pleasant  female, alert and oriented x 3, in no cardio-pulmonary distress. Afebrile. HEENT No facial trauma or asymetry. Sinuses non tender.  Extra occullar muscles intact, pupils equally reactive to light. External ears normal, tympanic membranes clear. Oropharynx moist, no exudate. Neck: supple, no adenopathy,JVD or thyromegaly.No bruits.  Chest: Clear to ascultation bilaterally.No crackles or wheezes. Non tender to palpation  Breast: No asymetry,no masses or lumps. No tenderness. No nipple discharge or inversion. No axillary or supraclavicular adenopathy  Cardiovascular system; Heart sounds normal,  S1 and  S2 ,no S3.  No murmur, or thrill. Apical beat not displaced Peripheral pulses normal.  Abdomen: Soft, non tender, no organomegaly or masses. No bruits. Bowel sounds normal. No guarding, tenderness or rebound.  Rectal:  Normal sphincter tone. No rectal mass. Guaiac negative stool.  GU: External genitalia normal female genitalia , normal female distribution of hair. No lesions. Urethral meatus normal in size, no  Prolapse, no lesions visibly  Present. Bladder non tender. Vagina pink and moist , with no visible lesions , discharge present . Adequate pelvic support no  cystocele or rectocele noted Uterus absent, no adnexal masses, no  adnexal tenderness.   Musculoskeletal exam: Decreased ROM of spine, hips , shoulders and knees. No deformity ,swelling or crepitus noted. No muscle wasting or atrophy.   Neurologic: Cranial nerves 2 to 12 intact. Power, tone ,and reflexes normal throughout. No disturbance in gait. No tremor.  Skin: Intact, no ulceration, erythema , scaling or rash  noted. Pigmentation normal throughout  Psych; Normal mood mildly anxious. Judgement and concentration normal   Assessment & Plan:  Annual physical exam Annual exam as documented. Counseling done  re healthy lifestyle involving commitment to 150 minutes exercise per week, heart healthy diet, and attaining healthy weight.The importance of adequate sleep also discussed. Regular seat belt use and home safety, is also discussed. Changes in health habits are decided on by the patient with goals and time frames  set for achieving them. Immunization and cancer screening needs are specifically addressed at this visit.   Uncontrolled type 2 diabetes mellitus with stage 3 chronic kidney disease (HCC) Deteriorated. Increase insulin to 37 units twice daily and see diabetic educator

## 2015-10-10 ENCOUNTER — Other Ambulatory Visit (INDEPENDENT_AMBULATORY_CARE_PROVIDER_SITE_OTHER): Payer: Self-pay | Admitting: Otolaryngology

## 2015-10-10 DIAGNOSIS — R221 Localized swelling, mass and lump, neck: Secondary | ICD-10-CM

## 2015-10-27 ENCOUNTER — Other Ambulatory Visit: Payer: Self-pay | Admitting: Family Medicine

## 2015-10-30 ENCOUNTER — Other Ambulatory Visit: Payer: Self-pay

## 2015-10-30 MED ORDER — INSULIN LISPRO PROT & LISPRO (75-25 MIX) 100 UNIT/ML KWIKPEN: PEN_INJECTOR | SUBCUTANEOUS | 3 refills | Status: DC

## 2015-10-31 ENCOUNTER — Ambulatory Visit (HOSPITAL_COMMUNITY)
Admission: RE | Admit: 2015-10-31 | Discharge: 2015-10-31 | Disposition: A | Discharge status: Home / Self Care | Payer: Medicare Other | Source: Ambulatory Visit | Attending: Otolaryngology | Admitting: Otolaryngology

## 2015-10-31 DIAGNOSIS — E042 Nontoxic multinodular goiter: Secondary | ICD-10-CM | POA: Diagnosis not present

## 2015-10-31 DIAGNOSIS — R221 Localized swelling, mass and lump, neck: Secondary | ICD-10-CM

## 2015-11-07 ENCOUNTER — Ambulatory Visit: Payer: Medicare Other | Admitting: "Endocrinology

## 2015-11-09 ENCOUNTER — Other Ambulatory Visit: Payer: Self-pay | Admitting: "Endocrinology

## 2015-11-09 DIAGNOSIS — E890 Postprocedural hypothyroidism: Secondary | ICD-10-CM | POA: Diagnosis not present

## 2015-11-09 DIAGNOSIS — R7302 Impaired glucose tolerance (oral): Secondary | ICD-10-CM | POA: Diagnosis not present

## 2015-11-09 LAB — COMPLETE METABOLIC PANEL WITH GFR
ALT: 20 U/L (ref 6–29)
AST: 21 U/L (ref 10–35)
Albumin: 4.2 g/dL (ref 3.6–5.1)
Alkaline Phosphatase: 88 U/L (ref 33–130)
BUN: 16 mg/dL (ref 7–25)
CHLORIDE: 96 mmol/L — AB (ref 98–110)
CO2: 25 mmol/L (ref 20–31)
Calcium: 9.4 mg/dL (ref 8.6–10.4)
Creat: 0.94 mg/dL — ABNORMAL HIGH (ref 0.60–0.93)
GFR, Est African American: 71 mL/min (ref >=60)
GFR, Est Non African American: 62 mL/min (ref >=60)
GLUCOSE: 336 mg/dL — AB (ref 65–99)
POTASSIUM: 4.4 mmol/L (ref 3.5–5.3)
SODIUM: 132 mmol/L — AB (ref 135–146)
Total Bilirubin: 0.5 mg/dL (ref 0.2–1.2)
Total Protein: 7.2 g/dL (ref 6.1–8.1)

## 2015-11-10 LAB — HEMOGLOBIN A1C
Hgb A1c MFr Bld: 8.3 % — ABNORMAL HIGH (ref <5.7)
Mean Plasma Glucose: 192 mg/dL

## 2015-11-14 ENCOUNTER — Encounter: Payer: Self-pay | Admitting: "Endocrinology

## 2015-11-14 ENCOUNTER — Ambulatory Visit (INDEPENDENT_AMBULATORY_CARE_PROVIDER_SITE_OTHER): Payer: Medicare Other | Admitting: "Endocrinology

## 2015-11-14 ENCOUNTER — Ambulatory Visit (INDEPENDENT_AMBULATORY_CARE_PROVIDER_SITE_OTHER): Payer: Medicare Other

## 2015-11-14 VITALS — BP 111/74 | HR 105 | Wt 207.0 lb

## 2015-11-14 DIAGNOSIS — E890 Postprocedural hypothyroidism: Secondary | ICD-10-CM | POA: Diagnosis not present

## 2015-11-14 DIAGNOSIS — Z23 Encounter for immunization: Secondary | ICD-10-CM

## 2015-11-14 NOTE — Progress Notes (Signed)
Subjective:    Patient ID: Stacey Hanson, female    DOB: 1945/08/16, PCP Tula Nakayama, MD   Past Medical History:  Diagnosis Date  . BMI 30.0-30.9,adult 2012 215 LBS   2004 198 LBS  . Diabetes mellitus   . 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 H. pylori infection 09/24/2010  . Hyperlipidemia   . Hypertension   . IDDM (insulin dependent diabetes mellitus) ([place]) 1987  . Knee pain   . Obesity    Past Surgical History:  Procedure Laterality Date  . ABDOMINAL HYSTERECTOMY  1970  . CHOLECYSTECTOMY  OCT 2010 BZ BILIARY DYSKINESIA   CHRONIC CHOLECYSTITIS  . COLONOSCOPY  MAY 2009 SCREENING   pTC TICS, SML IH  . ESOPHAGOGASTRODUODENOSCOPY  10/03/2010   Procedure: ESOPHAGOGASTRODUODENOSCOPY (EGD);  Surgeon: Dorothyann Peng, MD;  [facility];  Service: Endoscopy;  Laterality: N/A;  . ESOPHAGOGASTRODUODENOSCOPY  12/19/2002   RMR: Normal esophagus/Normal stomach/ Duodenum large bulbar diverticulum, 1 cm bulbar ulcer with surrounding/ inflammation as described above. Normal D2  . SAVORY DILATION  10/03/2010   Procedure: SAVORY DILATION;  Surgeon: Dorothyann Peng, MD;  [facility];  Service: Endoscopy;;  . TOTAL ABDOMINAL HYSTERECTOMY  1970  . UPPER GASTROINTESTINAL ENDOSCOPY  OCT 2004 RMR   DUO TIC, & ULCER   Social History   Social History  . Marital status: Single    Spouse name: N/A  . Number of children: N/A  . Years of education: N/A   Occupational History  . [employer]    Social History Main Topics  . Smoking status: Former Smoker    Types: Cigarettes    Quit date: 02/20/1985  . Smokeless tobacco: Never Used  . Alcohol use No  . Drug use: No  . Sexual activity: Not Currently   Other Topics Concern  . None   Social History Narrative  . None   Outpatient Encounter Prescriptions as of 11/14/2015  Medication Sig  . aspirin EC 81 MG tablet Take 1 tablet (81 mg total) by mouth daily.  . benazepril (LOTENSIN) 40 MG tablet TAKE  1 TABLET(40 MG) BY MOUTH DAILY  . Blood Glucose Monitoring Suppl ([place]) KIT by Does not apply route 2 (two) times daily. Two times a day testing   . Calcium Carbonate-Vitamin D (CALCIUM PLUS VITAMIN D PO) Take by mouth 2 (two) times daily. Take two tablets daily   . glucose blood (ONE TOUCH ULTRA TEST) test strip Use as instructed twice daily dx 250.01  . glucose blood (TRUETRACK TEST) test strip 1 each by Other route 2 (two) times daily. Two times a day   . hydrochlorothiazide (HYDRODIURIL) 25 MG tablet TAKE 1 TABLET(25 MG) BY MOUTH DAILY  . ibuprofen (ADVIL,MOTRIN) 800 MG tablet One tablet twice daily for 1 week  . Insulin Lispro Prot & Lispro (HUMALOG MIX 75/25 KWIKPEN) (75-25) 100 UNIT/ML Kwikpen INJECT 40 UNITS INTO THE SKIN TWICE DAILY.  Marland Kitchen Insulin Pen Needle (B-D UF III MINI PEN NEEDLES) 31G X 5 MM MISC For use twice daily with injection of Humalog  . levothyroxine (SYNTHROID, LEVOTHROID) 88 MCG tablet TAKE 1 TABLET BY MOUTH EVERY DAY IN THE MORNING  . loratadine (CLARITIN) 10 MG tablet Take 1 tablet (10 mg total) by mouth daily.  . metFORMIN (GLUCOPHAGE) 1000 MG tablet Take 1 tablet (1,000 mg total) by mouth 2 (two) times daily with a meal.  . Multiple Vitamin (MULTIVITAMIN) capsule Take 1 capsule  by mouth daily.    Marland Kitchen omeprazole (PRILOSEC) 20 MG capsule TAKE 1 CAPSULE BY MOUTH EVERY MORNING  . ONETOUCH DELICA LANCETS 52W MISC Use as directed twice daily dx 250.01  . pravastatin (PRAVACHOL) 40 MG tablet TAKE 1 TABLET(40 MG) BY MOUTH DAILY   No facility-administered encounter medications on file as of 11/14/2015.    ALLERGIES: Allergies  Allergen Reactions  . Simvastatin     REACTION: muscle cramps   VACCINATION STATUS: Immunization History  Administered Date(s) Administered  . Influenza,inj,Quad PF,36+ Mos 11/16/2012, 11/15/2013, 11/14/2014, 11/14/2015  . Pneumococcal Conjugate-13 10/11/2013  . Pneumococcal Polysaccharide-23 10/07/2011  . Zoster 04/05/2006     Hypertension  Pertinent negatives include no chest pain, headaches, palpitations or shortness of breath.  Diabetes  Pertinent negatives for hypoglycemia include no confusion, headaches, pallor or seizures. Associated symptoms include fatigue. Pertinent negatives for diabetes include no chest pain, no polydipsia, no polyphagia and no polyuria.   70 yr old female with medical hx of type 2 DM, HTN,  RAI induced hypothyroidism, Obesity.   She was given RAI for toxic  On 10/17/2010. She has hx of multinodular goiter s/p  FNA was benign. She returns for f/u  of her hypothyroidism.  She denies heat intolerance/ cold intolerance. She has lost approximately 6 pounds since last visit.  no dysphagia, SOB, nor voice change. she iso n Lt4 28mg po qam.   Review of Systems  Constitutional: Positive for fatigue. Negative for chills, fever and unexpected weight change.  HENT: Negative for trouble swallowing and voice change.   Eyes: Negative for visual disturbance.  Respiratory: Negative for cough, shortness of breath and wheezing.   Cardiovascular: Negative for chest pain, palpitations and leg swelling.  Gastrointestinal: Negative for diarrhea, nausea and vomiting.  Endocrine: Negative for cold intolerance, heat intolerance, polydipsia, polyphagia and polyuria.  Musculoskeletal: Negative for arthralgias and myalgias.  Skin: Negative for color change, pallor, rash and wound.  Neurological: Negative for seizures and headaches.  Psychiatric/Behavioral: Negative for confusion and suicidal ideas.    Objective:    BP 111/74   Pulse (!) 105   Wt 207 lb (93.9 kg)   BMI 30.57 kg/m   Wt Readings from Last 3 Encounters:  11/14/15 207 lb (93.9 kg)  10/03/15 213 lb (96.6 kg)  07/24/15 214 lb (97.1 kg)    Physical Exam  Constitutional: She is oriented to person, place, and time. She appears well-developed.  HENT:  Head: Normocephalic and atraumatic.  Eyes: EOM are normal.  Neck: Normal range of  motion. Neck supple. No tracheal deviation present. No thyromegaly present.  Cardiovascular: Normal rate and regular rhythm.   Pulmonary/Chest: Effort normal and breath sounds normal.  Abdominal: Soft. Bowel sounds are normal. There is no tenderness. There is no guarding.  Musculoskeletal: Normal range of motion. She exhibits no edema.  Neurological: She is alert and oriented to person, place, and time. She has normal reflexes. No cranial nerve deficit. Coordination normal.  Skin: Skin is warm and dry. No rash noted. No erythema. No pallor.  Psychiatric: She has a normal mood and affect. Judgment normal.    CMP     Component Value Date/Time   NA 132 (L) 11/09/2015 0911   K 4.4 11/09/2015 0911   CL 96 (L) 11/09/2015 0911   CO2 25 11/09/2015 0911   GLUCOSE 336 (H) 11/09/2015 0911   BUN 16 11/09/2015 0911   CREATININE 0.94 (H) 11/09/2015 0911   CALCIUM 9.4 11/09/2015 0911   PROT 7.2 11/09/2015  0911   ALBUMIN 4.2 11/09/2015 0911   AST 21 11/09/2015 0911   ALT 20 11/09/2015 0911   ALKPHOS 88 11/09/2015 0911   BILITOT 0.5 11/09/2015 0911   GFRNONAA 62 11/09/2015 0911   GFRAA 71 11/09/2015 0911     Diabetic Labs (most recent): Lab Results  Component Value Date   HGBA1C 8.3 (H) 11/09/2015   HGBA1C 8.0 (H) 09/28/2015   HGBA1C 7.4 07/17/2015     Lipid Panel ( most recent) Lipid Panel     Component Value Date/Time   CHOL 161 04/17/2015 0842   TRIG 86 04/17/2015 0842   HDL 65 04/17/2015 0842   CHOLHDL 2.5 04/17/2015 0842   VLDL 17 04/17/2015 0842   LDLCALC 79 04/17/2015 0842   - She has not done thyroid function tests since last visit.   Assessment & Plan:    1. Hypothyroidism, postradioiodine therapy -  I have advised her to continue levothyroxine 88 g by mouth every morning.   - We discussed about correct intake of levothyroxine, at fasting, with water, separated by at least 30 minutes from breakfast, and separated by more than 4 hours from calcium, iron,  multivitamins, acid reflux medications (PPIs). -Patient is made aware of the fact that thyroid hormone replacement is needed for life, dose to be adjusted by periodic monitoring of thyroid function tests.   2. Uncontrolled type 2 diabetes mellitus with stage 3 chronic kidney disease, with long-term current use of insulin (HCC)  - Her A1c is higher at 8.3%. She insists that she will address that with Dr. Moshe Cipro. I advised patient to continue Humalog 75/25  35 units with breakfast and supper when pre-meal blood glucose is above 90 mg/dL. I have Given her detailed carbs and exercise regimen. I advised her to continue metformin 500 mg by mouth twice a day given rising serum creatinine and declining GFR.  3. Essential hypertension, benign - Uncontrolled, I advised her to be consistent in her blood pressure medications including benazepril 40 mg by mouth daily and hydrochlorothiazide 25 mg by mouth daily. Salt restriction advised.   4. Hyperlipidemia:  I advised her to continue pravastatin 40 mg by mouth daily at bedtime. Side effects and precautions discussed with her. -She will need fasting lipid panel on subsequent visits.  - 25 minutes of time was spent on the care of this patient , 50% of which was applied for counseling on diabetes complications and their preventions.  - I advised patient to maintain close follow up with Tula Nakayama, MD for primary care needs. Follow up plan: Return in about 4 months (around 03/15/2016) for follow up with pre-visit labs.  Glade Lloyd, MD Phone: [phone]  Fax: [phone]   11/14/2015, 2:59 PM

## 2015-11-18 ENCOUNTER — Ambulatory Visit (INDEPENDENT_AMBULATORY_CARE_PROVIDER_SITE_OTHER): Payer: Medicare Other | Admitting: Otolaryngology

## 2015-11-18 DIAGNOSIS — D440 Neoplasm of uncertain behavior of thyroid gland: Secondary | ICD-10-CM | POA: Diagnosis not present

## 2015-12-30 ENCOUNTER — Other Ambulatory Visit: Payer: Self-pay | Admitting: Family Medicine

## 2016-01-01 ENCOUNTER — Other Ambulatory Visit: Payer: Self-pay | Admitting: Family Medicine

## 2016-01-06 ENCOUNTER — Encounter: Payer: Self-pay | Admitting: Gastroenterology

## 2016-01-07 ENCOUNTER — Encounter: Payer: Self-pay | Admitting: Orthopedic Surgery

## 2016-01-07 ENCOUNTER — Ambulatory Visit (INDEPENDENT_AMBULATORY_CARE_PROVIDER_SITE_OTHER): Payer: Medicare Other | Admitting: Orthopedic Surgery

## 2016-01-07 ENCOUNTER — Ambulatory Visit (INDEPENDENT_AMBULATORY_CARE_PROVIDER_SITE_OTHER): Payer: Medicare Other

## 2016-01-07 VITALS — BP 116/67 | HR 116 | Ht 68.0 in | Wt 204.0 lb

## 2016-01-07 DIAGNOSIS — M1710 Unilateral primary osteoarthritis, unspecified knee: Secondary | ICD-10-CM | POA: Diagnosis not present

## 2016-01-07 NOTE — Progress Notes (Signed)
Patient ID: Stacey Hanson, female   DOB: 06-20-1945, 70 y.o.   MRN: [id number]  Chief Complaint  Patient presents with  . Follow-up    RIGHT KNEE YEARLY XRAYS    HPI Stacey Hanson is a 70 y.o. female.   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 Her symptoms are very benign at present. She is working out at [employer]. She has mild pain in the lateral compartment no functional deficit  Review of Systems Review of Systems Normal neuro  Denies fever  Examination BP 116/67   Pulse (!) 116   Ht 5\' 8"  (1.727 m)   Wt 204 lb (92.5 kg)   BMI 31.02 kg/m   Gen. appearance the patient's appearance is normal with normal grooming and  hygiene The patient is oriented to person place and time Mood and affect are normal   Ortho Exam Gait is remarkable for no ambulatory deficits at this time Inspection reveals mild valgus alignment to the right knee with no swelling in the joint ROM is 115 Stability tests are normal  Motor exam 5/5 manual muscle testing , no atrophy  Skin is normal (no rash or erythema)    Medical decision-making Diagnosis, Data, Plan (risk)  Encounter Diagnosis  Name Primary?  Marland Kitchen Arthritis of knee Yes   Imaging shows no change in the x-ray from last year to this year but osteoarthritis moderate to severe in the lateral compartment is noted  Follow-up one year  Arther Abbott, MD 01/07/2016 9:35 AM

## 2016-01-11 DIAGNOSIS — M8580 Other specified disorders of bone density and structure, unspecified site: Secondary | ICD-10-CM | POA: Diagnosis not present

## 2016-01-11 DIAGNOSIS — Z794 Long term (current) use of insulin: Secondary | ICD-10-CM | POA: Diagnosis not present

## 2016-01-11 DIAGNOSIS — I10 Essential (primary) hypertension: Secondary | ICD-10-CM | POA: Diagnosis not present

## 2016-01-11 DIAGNOSIS — E119 Type 2 diabetes mellitus without complications: Secondary | ICD-10-CM | POA: Diagnosis not present

## 2016-01-11 LAB — COMPLETE METABOLIC PANEL WITH GFR
ALT: 19 U/L (ref 6–29)
AST: 25 U/L (ref 10–35)
Albumin: 4.3 g/dL (ref 3.6–5.1)
Alkaline Phosphatase: 71 U/L (ref 33–130)
BUN: 15 mg/dL (ref 7–25)
CALCIUM: 9.5 mg/dL (ref 8.6–10.4)
CHLORIDE: 101 mmol/L (ref 98–110)
CO2: 24 mmol/L (ref 20–31)
Creat: 0.94 mg/dL — ABNORMAL HIGH (ref 0.60–0.93)
GFR, EST AFRICAN AMERICAN: 71 mL/min (ref >=60)
GFR, Est Non African American: 62 mL/min (ref >=60)
Glucose, Bld: 104 mg/dL — ABNORMAL HIGH (ref 65–99)
POTASSIUM: 4.7 mmol/L (ref 3.5–5.3)
Sodium: 137 mmol/L (ref 135–146)
Total Bilirubin: 0.5 mg/dL (ref 0.2–1.2)
Total Protein: 7.5 g/dL (ref 6.1–8.1)

## 2016-01-11 LAB — CBC
HEMATOCRIT: 37.5 % (ref 35.0–45.0)
Hemoglobin: 12.5 g/dL (ref 11.7–15.5)
MCH: 30.9 pg (ref 27.0–33.0)
MCHC: 33.3 g/dL (ref 32.0–36.0)
MCV: 92.8 fL (ref 80.0–100.0)
MPV: 9.7 fL (ref 7.5–12.5)
Platelets: 481 10*3/uL — ABNORMAL HIGH (ref 140–400)
RBC: 4.04 MIL/uL (ref 3.80–5.10)
RDW: 14 % (ref 11.0–15.0)
WBC: 6.7 10*3/uL (ref 3.8–10.8)

## 2016-01-11 LAB — LIPID PANEL
CHOL/HDL RATIO: 2.2 ratio (ref <5.0)
CHOLESTEROL: 162 mg/dL (ref <200)
HDL: 73 mg/dL (ref >50)
LDL CALC: 78 mg/dL (ref <100)
Triglycerides: 55 mg/dL (ref <150)
VLDL: 11 mg/dL (ref <30)

## 2016-01-13 LAB — VITAMIN D 25 HYDROXY (VIT D DEFICIENCY, FRACTURES): VIT D 25 HYDROXY: 50 ng/mL (ref 30–100)

## 2016-01-16 ENCOUNTER — Ambulatory Visit (INDEPENDENT_AMBULATORY_CARE_PROVIDER_SITE_OTHER): Payer: Medicare Other | Admitting: Family Medicine

## 2016-01-16 ENCOUNTER — Encounter: Payer: Self-pay | Admitting: Family Medicine

## 2016-01-16 VITALS — BP 128/78 | HR 89 | Temp 99.3°F | Resp 16 | Ht 68.0 in | Wt 203.0 lb

## 2016-01-16 DIAGNOSIS — I10 Essential (primary) hypertension: Secondary | ICD-10-CM

## 2016-01-16 DIAGNOSIS — E0865 Diabetes mellitus due to underlying condition with hyperglycemia: Secondary | ICD-10-CM

## 2016-01-16 DIAGNOSIS — Z794 Long term (current) use of insulin: Secondary | ICD-10-CM

## 2016-01-16 DIAGNOSIS — E784 Other hyperlipidemia: Secondary | ICD-10-CM

## 2016-01-16 DIAGNOSIS — IMO0002 Reserved for concepts with insufficient information to code with codable children: Secondary | ICD-10-CM

## 2016-01-16 DIAGNOSIS — J302 Other seasonal allergic rhinitis: Secondary | ICD-10-CM

## 2016-01-16 DIAGNOSIS — E08618 Diabetes mellitus due to underlying condition with other diabetic arthropathy: Secondary | ICD-10-CM | POA: Diagnosis not present

## 2016-01-16 DIAGNOSIS — IMO0001 Reserved for inherently not codable concepts without codable children: Secondary | ICD-10-CM

## 2016-01-16 DIAGNOSIS — E6609 Other obesity due to excess calories: Secondary | ICD-10-CM

## 2016-01-16 DIAGNOSIS — E7849 Other hyperlipidemia: Secondary | ICD-10-CM

## 2016-01-16 MED ORDER — GLUCOSE BLOOD VI STRP: ORAL_STRIP | 5 refills | Status: DC

## 2016-01-16 MED ORDER — ONETOUCH DELICA LANCETS 33G MISC: 5 refills | Status: DC

## 2016-01-16 MED ORDER — MONTELUKAST SODIUM 10 MG PO TABS: 10.0000 mg | ORAL_TABLET | Freq: Every day | ORAL | 3 refills | Status: DC

## 2016-01-16 NOTE — Assessment & Plan Note (Addendum)
Uncontrolled , start singulair daily, med sent in, pt educated re allergy symptoms and management

## 2016-01-16 NOTE — Patient Instructions (Addendum)
Wellness in December as  Before  Excellent cholesterol, kidney and liver and blood count, excellent blood pressure, no medication changes   HBA1C in January, on the same day you get blood work for Dr Dorris Fetch, please take ourpaper  For allergy symptoms , singulair one daily is sent in

## 2016-01-20 ENCOUNTER — Encounter: Payer: Self-pay | Admitting: Family Medicine

## 2016-01-20 NOTE — Assessment & Plan Note (Signed)
Hyperlipidemia:Low fat diet discussed and encouraged.   Lipid Panel  Lab Results  Component Value Date   CHOL 162 01/11/2016   HDL 73 01/11/2016   LDLCALC 78 01/11/2016   TRIG 55 01/11/2016   CHOLHDL 2.2 01/11/2016  Controlled, no change in medication

## 2016-01-20 NOTE — Progress Notes (Signed)
[facility]     MRN: [id number]      DOB: 10-16-1945   HPI [name] is here for follow up and re-evaluation of chronic medical conditions, medication management and review of any available recent lab and radiology data.  Preventive health is updated, specifically  Cancer screening and Immunization.   Questions or concerns regarding consultations or procedures which the PT has had in the interim are  addressed. The PT denies any adverse reactions to current medications since the last visit.  2 day h/o uncontrolled cough with sneezing and hacking, no fever, chills or sputum Denies polyuria, polydipsia, blurred vision , or hypoglycemic episodes.    ROS Denies recent fever or chills. Denies sinus pressure, nasal congestion, ear pain or sore throat. Denies chest congestion, productive cough or wheezing. Denies chest pains, palpitations and leg swelling Denies abdominal pain, nausea, vomiting,diarrhea or constipation.   Denies dysuria, frequency, hesitancy or incontinence. Denies joint pain, swelling and limitation in mobility. Denies headaches, seizures, numbness, or tingling. Denies depression, anxiety or insomnia. Denies skin break down or rash.   PE  BP 128/78   Pulse 89   Temp 99.3 F (37.4 C) (Oral)   Resp 16   Ht 5\' 8"  (1.727 m)   Wt 203 lb (92.1 kg)   SpO2 100%   BMI 30.87 kg/m   Patient alert and oriented and in no cardiopulmonary distress.  HEENT: No facial asymmetry, EOMI,   oropharynx pink and moist.  Neck supple no JVD, no mass.  Chest: Clear to auscultation bilaterally.  CVS: S1, S2 no murmurs, no S3.Regular rate.  ABD: Soft non tender.   Ext: No edema  MS: Adequate ROM spine, shoulders, hips and knees.  Skin: Intact, no ulcerations or rash noted.  Psych: Good eye contact, normal affect. Memory intact not anxious or depressed appearing.  CNS: CN 2-12 intact, power,  normal throughout.no focal deficits noted.   Assessment & Plan  Seasonal  allergies Uncontrolled , start singulair daily, med sent in, pt educated re allergy symptoms and management  ESSENTIAL HYPERTENSION, BENIGN Controlled, no change in medication DASH diet and commitment to daily physical activity for a minimum of 30 minutes discussed and encouraged, as a part of hypertension management. The importance of attaining a healthy weight is also discussed.  BP/Weight 01/16/2016 01/07/2016 11/14/2015 10/03/2015 07/24/2015 05/08/2015 123XX123  Systolic BP 0000000 99991111 [id number] [id number] Q000111Q [id number] 0000000  Diastolic BP 78 67 74 70 82 70 78  Wt. (Lbs) 203 204 207 213 214 212.6 211  BMI 30.87 31.02 30.57 31.45 31.59 31.38 31.15       Hyperlipidemia Hyperlipidemia:Low fat diet discussed and encouraged.   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 is reminded of the importance of commitment to daily physical activity for 30 minutes or more, as able and the need to limit carbohydrate intake to 30 to 60 grams per meal to help with blood sugar control.   The need to take medication as prescribed, test blood sugar as directed, and to call between visits if there is a concern that blood sugar is uncontrolled is also discussed.   [name] is reminded of the importance of daily foot exam, annual eye  examination, and good blood sugar, blood pressure and cholesterol control.  Diabetic Labs Latest Ref Rng & Units 01/11/2016 11/09/2015 09/28/2015 07/17/2015 04/17/2015  HbA1c <5.7 % - 8.3(H) 8.0(H) 7.4 7.7(H)  Microalbumin <2.0 mg/dL - - - - -  Micro/Creat Ratio 0.0 - 30.0 mg/g - - - - -  Chol <200 mg/dL 162 - - - 161  HDL >50 mg/dL 73 - - - 65  Calc LDL <100 mg/dL 78 - - - 79  Triglycerides <150 mg/dL 55 - - - 86  Creatinine 0.60 -  0.93 mg/dL 0.94(H) 0.94(H) 1.03(H) - 0.96   BP/Weight 01/16/2016 01/07/2016 11/14/2015 10/03/2015 07/24/2015 05/08/2015 123XX123  Systolic BP 0000000 99991111 99991111 [id number] Q000111Q [id number] 0000000  Diastolic BP 78 67 74 70 82 70 78  Wt. (Lbs) 203 204 207 213 214 212.6 211  BMI 30.87 31.02 30.57 31.45 31.59 31.38 31.15   Foot/eye exam completion dates Latest Ref Rng & Units 10/03/2015 09/27/2014  Eye Exam No Retinopathy - -  Foot exam Order - - -  Foot Form Completion - Done Done   Reports improvement   Updated lab needed at/ before next visit.   Obesity Deteriorated. Patient re-educated about  the importance of commitment to a  minimum of 150 minutes of exercise per week.  The importance of healthy food choices with portion control discussed. Encouraged to start a food diary, count calories and to consider  joining a support group. Sample diet sheets offered. Goals set by the patient for the next several months.   Weight /BMI 01/16/2016 01/07/2016 11/14/2015  WEIGHT 203 lb 204 lb 207 lb  HEIGHT 5\' 8"  5\' 8"  -  BMI 30.87 kg/m2 31.02 kg/m2 30.57 kg/m2    \

## 2016-01-20 NOTE — Assessment & Plan Note (Signed)
Deteriorated. Patient re-educated about  the importance of commitment to a  minimum of 150 minutes of exercise per week.  The importance of healthy food choices with portion control discussed. Encouraged to start a food diary, count calories and to consider  joining a support group. Sample diet sheets offered. Goals set by the patient for the next several months.   Weight /BMI 01/16/2016 01/07/2016 11/14/2015  WEIGHT 203 lb 204 lb 207 lb  HEIGHT 5\' 8"  5\' 8"  -  BMI 30.87 kg/m2 31.02 kg/m2 30.57 kg/m2    \

## 2016-01-20 NOTE — Assessment & Plan Note (Signed)
Controlled, no change in medication DASH diet and commitment to daily physical activity for a minimum of 30 minutes discussed and encouraged, as a part of hypertension management. The importance of attaining a healthy weight is also discussed.  BP/Weight 01/16/2016 01/07/2016 11/14/2015 10/03/2015 07/24/2015 05/08/2015 123XX123  Systolic BP 0000000 99991111 99991111 [id number] Q000111Q [id number] 0000000  Diastolic BP 78 67 74 70 82 70 78  Wt. (Lbs) 203 204 207 213 214 212.6 211  BMI 30.87 31.02 30.57 31.45 31.59 31.38 31.15

## 2016-01-20 NOTE — Assessment & Plan Note (Signed)
Stacey [facility]on is reminded of the importance of commitment to daily physical activity for 30 minutes or more, as able and the need to limit carbohydrate intake to 30 to 60 grams per meal to help with blood sugar control.   The need to take medication as prescribed, test blood sugar as directed, and to call between visits if there is a concern that blood sugar is uncontrolled is also discussed.   Stacey [facility] is reminded of the importance of daily foot exam, annual eye examination, and good blood sugar, blood pressure and cholesterol control.  Diabetic Labs Latest Ref Rng & Units 01/11/2016 11/09/2015 09/28/2015 07/17/2015 04/17/2015  HbA1c <5.7 % - 8.3(H) 8.0(H) 7.4 7.7(H)  Microalbumin <2.0 mg/dL - - - - -  Micro/Creat Ratio 0.0 - 30.0 mg/g - - - - -  Chol <200 mg/dL 162 - - - 161  HDL >50 mg/dL 73 - - - 65  Calc LDL <100 mg/dL 78 - - - 79  Triglycerides <150 mg/dL 55 - - - 86  Creatinine 0.60 - 0.93 mg/dL 0.94(H) 0.94(H) 1.03(H) - 0.96   BP/Weight 01/16/2016 01/07/2016 11/14/2015 10/03/2015 07/24/2015 05/08/2015 123XX123  Systolic BP 0000000 99991111 99991111 [id number] Q000111Q [id number] 0000000  Diastolic BP 78 67 74 70 82 70 78  Wt. (Lbs) 203 204 207 213 214 212.6 211  BMI 30.87 31.02 30.57 31.45 31.59 31.38 31.15   Foot/eye exam completion dates Latest Ref Rng & Units 10/03/2015 09/27/2014  Eye Exam No Retinopathy - -  Foot exam Order - - -  Foot Form Completion - Done Done   Reports improvement   Updated lab needed at/ before next visit.

## 2016-01-27 ENCOUNTER — Other Ambulatory Visit: Payer: Self-pay | Admitting: Family Medicine

## 2016-01-27 ENCOUNTER — Other Ambulatory Visit: Payer: Self-pay | Admitting: Nurse Practitioner

## 2016-02-10 ENCOUNTER — Other Ambulatory Visit: Payer: Self-pay | Admitting: Family Medicine

## 2016-02-13 ENCOUNTER — Encounter: Payer: Self-pay | Admitting: Gastroenterology

## 2016-02-13 ENCOUNTER — Ambulatory Visit (INDEPENDENT_AMBULATORY_CARE_PROVIDER_SITE_OTHER): Payer: Medicare Other | Admitting: Gastroenterology

## 2016-02-13 DIAGNOSIS — K219 Gastro-esophageal reflux disease without esophagitis: Secondary | ICD-10-CM

## 2016-02-13 DIAGNOSIS — Z1211 Encounter for screening for malignant neoplasm of colon: Secondary | ICD-10-CM

## 2016-02-13 DIAGNOSIS — R195 Other fecal abnormalities: Secondary | ICD-10-CM

## 2016-02-13 NOTE — Progress Notes (Signed)
CC'ED TO PCP 

## 2016-02-13 NOTE — Assessment & Plan Note (Signed)
NO WARNING SIGNS/SYMPTOMS.   REVIEWED TCS REPORT FROM 2009.   CONTINUE YOUR WEIGHT LOSS EFFORTS. DRINK WATER TO KEEP YOUR URINE LIGHT YELLOW.  FOLLOW A HIGH FIBER DIET. AVOID ITEMS THAT CAUSE LOOSE STOOLS, BLOATING & GAS.   Next colonoscopy in May 2019.

## 2016-02-13 NOTE — Assessment & Plan Note (Signed)
SYMPTOMS CONTROLLED/RESOLVED.Weight down lost 8 lbs since DEC 2016.  CONTINUE OMEPRAZOLE.  TAKE 30 MINUTES PRIOR TO YOUR MEALS ONCE DAILY. FOLLOW UP IN 1 YEAR.  CONTINUE YOUR WEIGHT LOSS EFFORTS.

## 2016-02-13 NOTE — Assessment & Plan Note (Signed)
RARE AND MAY BE DUE TO DAIRY OR FIBER.  DRINK WATER TO KEEP YOUR URINE LIGHT YELLOW. FOLLOW A HIGH FIBER DIET. AVOID ITEMS THAT CAUSE LOOSE STOOLS, BLOATING & GAS.  WATCH THE DAIRY IN YOUR DIET. IT CAN CAUSE LOOSE STOOLS.  CONTINUE OMEPRAZOLE.  TAKE 30 MINUTES PRIOR TO YOUR MEALS ONCE DAILY. FOLLOW UP IN 1 YEAR.

## 2016-02-13 NOTE — Patient Instructions (Signed)
Congratulation on your weight loss. Our scale show lost 8 lbs since DEC 2016.  DRINK WATER TO KEEP YOUR URINE LIGHT YELLOW.  FOLLOW A HIGH FIBER DIET. AVOID ITEMS THAT CAUSE LOOSE STOOLS, BLOATING & GAS. SEE INFO BELOW.  WATCH THE DAIRY IN YOUR DIET. IT CAN CAUSE LOOSE STOOLS.  CONTINUE OMEPRAZOLE.  TAKE 30 MINUTES PRIOR TO YOUR MEALS ONCE DAILY.  FOLLOW UP IN 1 YEAR.   Next colonoscopy in May 2019.  High-Fiber Diet A high-fiber diet changes your normal diet to include more whole grains, legumes, fruits, and vegetables. Changes in the diet involve replacing refined carbohydrates with unrefined foods. The calorie level of the diet is essentially unchanged. The Dietary Reference Intake (recommended amount) for adult males is 38 grams per day. For adult females, it is 25 grams per day. Pregnant and lactating women should consume 28 grams of fiber per day. Fiber is the intact part of a plant that is not broken down during digestion. Functional fiber is fiber that has been isolated from the plant to provide a beneficial effect in the body. PURPOSE  Increase stool bulk.   Ease and regulate bowel movements.   Lower cholesterol.   REDUCE RISK OF COLON CANCER  INDICATIONS THAT YOU NEED MORE FIBER  Constipation and hemorrhoids.   Uncomplicated diverticulosis (intestine condition) and irritable bowel syndrome.   Weight management.   As a protective measure against hardening of the arteries (atherosclerosis), diabetes, and cancer.   GUIDELINES FOR INCREASING FIBER IN THE DIET  Start adding fiber to the diet slowly. A gradual increase of about 5 more grams (2 slices of whole-wheat bread, 2 servings of most fruits or vegetables, or 1 bowl of high-fiber cereal) per day is best. Too rapid an increase in fiber may result in constipation, flatulence, and bloating.   Drink enough water and fluids to keep your urine clear or pale yellow. Water, juice, or caffeine-free drinks are recommended. Not  drinking enough fluid may cause constipation.   Eat a variety of high-fiber foods rather than one type of fiber.   Try to increase your intake of fiber through using high-fiber foods rather than fiber pills or supplements that contain small amounts of fiber.   The goal is to change the types of food eaten. Do not supplement your present diet with high-fiber foods, but replace foods in your present diet.  INCLUDE A VARIETY OF FIBER SOURCES  Replace refined and processed grains with whole grains, canned fruits with fresh fruits, and incorporate other fiber sources. White rice, white breads, and most bakery goods contain little or no fiber.   Brown whole-grain rice, buckwheat oats, and many fruits and vegetables are all good sources of fiber. These include: broccoli, Brussels sprouts, cabbage, cauliflower, beets, sweet potatoes, white potatoes (skin on), carrots, tomatoes, eggplant, squash, berries, fresh fruits, and dried fruits.   Cereals appear to be the richest source of fiber. Cereal fiber is found in whole grains and bran. Bran is the fiber-rich outer coat of cereal grain, which is largely removed in refining. In whole-grain cereals, the bran remains. In breakfast cereals, the largest amount of fiber is found in those with "bran" in their names. The fiber content is sometimes indicated on the label.   You may need to include additional fruits and vegetables each day.   In baking, for 1 cup white flour, you may use the following substitutions:   1 cup whole-wheat flour minus 2 tablespoons.   1/2 cup white flour plus 1/2  cup whole-wheat flour.

## 2016-02-13 NOTE — Progress Notes (Signed)
Subjective:    Patient ID: Stacey Hanson, female    DOB: August 29, 1945, 70 y.o.   MRN: [id number]  Tula Nakayama, MD  HPI Wants to know when her next TCS will be. HEARTBURN CONTROLLED. BOWELS GOOD. RARE CONSTIPATION AND DIARRHEA DEPENDING ON WHAT SHE EATS.  PT DENIES FEVER, CHILLS, HEMATOCHEZIA, nausea, vomiting, melena, diarrhea, CHEST PAIN, SHORTNESS OF BREATH, CHANGE IN BOWEL IN HABITS, abdominal pain, problems swallowing, OR heartburn or indigestion.  Past Medical History:  Diagnosis Date  . BMI 30.0-30.9,adult 2012 215 LBS   2004 198 LBS  . Diabetes mellitus   . 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 H. pylori infection 09/24/2010  . Hyperlipidemia   . Hypertension   . IDDM (insulin dependent diabetes mellitus) ([place]) 1987  . Knee pain   . Obesity    Past Surgical History:  Procedure Laterality Date  . ABDOMINAL HYSTERECTOMY  1970  . CHOLECYSTECTOMY  OCT 2010 BZ BILIARY DYSKINESIA   CHRONIC CHOLECYSTITIS  . COLONOSCOPY  MAY 2009 SCREENING   pTC TICS, SML IH  . ESOPHAGOGASTRODUODENOSCOPY  10/03/2010   Procedure: ESOPHAGOGASTRODUODENOSCOPY (EGD);  Surgeon: Dorothyann Peng, MD;  Location: [facility];  Service: Endoscopy;  Laterality: N/A;  . ESOPHAGOGASTRODUODENOSCOPY  12/19/2002   RMR: Normal esophagus/Normal stomach/ Duodenum large bulbar diverticulum, 1 cm bulbar ulcer with surrounding/ inflammation as described above. Normal D2  . SAVORY DILATION  10/03/2010   Procedure: SAVORY DILATION;  Surgeon: Dorothyann Peng, MD;  Location: [facility];  Service: Endoscopy;;  . TOTAL ABDOMINAL HYSTERECTOMY  1970  . UPPER GASTROINTESTINAL ENDOSCOPY  OCT 2004 RMR   DUO TIC, & ULCER    Allergies  Allergen Reactions  . Simvastatin     REACTION: muscle cramps    Current Outpatient Prescriptions  Medication Sig Dispense Refill  . ASPIRIN LOW DOSE 81 MG EC tablet TAKE 1 TABLET(81 MG) BY MOUTH DAILY    . B-D UF III MINI PEN NEEDLES 31G X 5 MM MISC USE FOR TWICE  DAILY INJECTIONS OF HUMALOG    . benazepril (LOTENSIN) 40 MG tablet TAKE 1 TABLET(40 MG) BY MOUTH DAILY    . Blood Glucose Monitoring Suppl ([place]) KIT by Does not apply route 2 (two) times daily. Two times a day testing     . Calcium Carbonate-Vitamin D (CALCIUM PLUS VITAMIN D PO) Take by mouth 2 (two) times daily. Take two tablets daily     . glucose blood (ONE TOUCH ULTRA TEST) test strip Use as instructed twice daily dx 250.01    . glucose blood (TRUETRACK TEST) test strip 1 each by Other route 2 (two) times daily. Two times a day     . hydrochlorothiazide (HYDRODIURIL) 25 MG tablet TAKE 1 TABLET(25 MG) BY MOUTH DAILY    . ibuprofen (ADVIL,MOTRIN) 800 MG tablet One tablet twice daily for 1 week    . Insulin Lispro Prot & Lispro (HUMALOG MIX 75/25 KWIKPEN) (75-25) 100 UNIT/ML Kwikpen INJECT 40 UNITS INTO THE SKIN TWICE DAILY.    Marland Kitchen levothyroxine (SYNTHROID, LEVOTHROID) 88 MCG tablet TAKE 1 TABLET BY MOUTH EVERY DAY IN THE MORNING    .      . metFORMIN (GLUCOPHAGE) 1000 MG tablet Take 1 tablet (1,000 mg total) by mouth 2 (two) times daily with a meal.    . montelukast (SINGULAIR) 10 MG tablet Take 1 tablet (10 mg total) by mouth at bedtime.    . Multiple Vitamin (MULTIVITAMIN) capsule Take  1 capsule by mouth daily.      Marland Kitchen omeprazole (PRILOSEC) 20 MG capsule TAKE 1 CAPSULE BY MOUTH EVERY MORNING    . ONETOUCH DELICA LANCETS 58X MISC Use as directed twice daily dx 250.01    . pravastatin (PRAVACHOL) 40 MG tablet TAKE 1 TABLET(40 MG) BY MOUTH DAILY     Review of Systems PER HPI OTHERWISE ALL SYSTEMS ARE NEGATIVE.     Objective:   Physical Exam  Constitutional: She is oriented to person, place, and time. She appears well-developed and well-nourished. No distress.  HENT:  Head: Normocephalic and atraumatic.  Mouth/Throat: Oropharynx is clear and moist. No oropharyngeal exudate.  Eyes: Pupils are equal, round, and reactive to light. No scleral icterus.  Neck: Normal range of  motion. Neck supple.  Cardiovascular: Normal rate, regular rhythm and normal heart sounds.   Pulmonary/Chest: Effort normal and breath sounds normal. No respiratory distress.  Abdominal: Soft. Bowel sounds are normal. She exhibits no distension. There is no tenderness.  Musculoskeletal: She exhibits no edema.  Lymphadenopathy:    She has no cervical adenopathy.  Neurological: She is alert and oriented to person, place, and time.  NO  NEW FOCAL DEFICITS  Psychiatric: She has a normal mood and affect.  Vitals reviewed.     Assessment & Plan:

## 2016-02-14 NOTE — Progress Notes (Signed)
ON RECALL  °

## 2016-02-25 ENCOUNTER — Ambulatory Visit (INDEPENDENT_AMBULATORY_CARE_PROVIDER_SITE_OTHER): Payer: Medicare Other

## 2016-02-25 VITALS — BP 138/62 | HR 100 | Resp 18 | Ht 68.0 in | Wt 204.1 lb

## 2016-02-25 DIAGNOSIS — IMO0002 Reserved for concepts with insufficient information to code with codable children: Secondary | ICD-10-CM

## 2016-02-25 DIAGNOSIS — E0865 Diabetes mellitus due to underlying condition with hyperglycemia: Secondary | ICD-10-CM | POA: Diagnosis not present

## 2016-02-25 DIAGNOSIS — E08618 Diabetes mellitus due to underlying condition with other diabetic arthropathy: Secondary | ICD-10-CM

## 2016-02-25 DIAGNOSIS — I10 Essential (primary) hypertension: Secondary | ICD-10-CM

## 2016-02-25 DIAGNOSIS — Z0000 Encounter for general adult medical examination without abnormal findings: Secondary | ICD-10-CM

## 2016-02-25 DIAGNOSIS — Z794 Long term (current) use of insulin: Secondary | ICD-10-CM

## 2016-02-25 NOTE — Assessment & Plan Note (Signed)

## 2016-02-25 NOTE — Patient Instructions (Signed)
Thank you for choosing [facility] for your health [facility] needs  The Annual Wellness Visit is designed to allow [place] the chance to assist you in preserving and improving you health.  Dr. Moshe Cipro will see you back after April 11th for your next visit   You will need labs in April for that visit.  The order has been provided to you.  This is to check you blood sugar which should be done every 3 months   Preventive Hanson for Adults  A healthy lifestyle and preventive Hans[place] can promote health and wellness. Preventive health guidelines for adults include the following key practices.  . A routine yearly physical is a good way to check with your health [facility]on provider about your health and preventive screening. It is a chance to share any concerns and updates on your health and to receive a thorough exam.  . Visit your dentist for a routine exam and preventive Hanson every 6 months. Brush your teeth twice a day and floss once a day. Good oral hygiene prevents tooth decay and gum disease.  . The frequency of eye exams is based on your age, health, family medical history, use  of contact lenses, and other factors. Follow your health Hanson provider's ecommendations for frequency of eye exams.  . Eat a healthy diet. Foods like vegetables, fruits, whole grains, low-fat dairy products, and lean protein foods contain the nutrients you need without too many calories. Decrease your intake of foods high in solid fats, added sugars, and salt. Eat the right amount of calories for you. Get information about a proper diet from your health Hanson provider, if necessary.  . Regular physical exercise is one of the most important things you can do for your health. Most adults should get at least 150 minutes of moderate-intensity exercise (any activity that increases your heart rate and causes you to sweat) each week. In addition, most adults need muscle-strengthening exercises on 2 or more days a week.  Silver Sneakers  may be a benefit available to you. To determine eligibility, you may visit the website: www.silversneakers.com or contact program at 316-490-2726 Mon-Fri between 8AM-8PM.   . Maintain a healthy weight. The body mass index (BMI) is a screening tool to identify possible weight problems. It provides an estimate of body fat based on height and weight. Your health Hanson provider can find your BMI and can help you achieve or maintain a healthy weight.   For adults 20 years and older: ? A BMI below 18.5 is considered underweight. ? A BMI of 18.5 to 24.9 is normal. ? A BMI of 25 to 29.9 is considered overweight. ? A BMI of 30 and above is considered obese.   . Maintain normal blood lipids and cholesterol levels by exercising and minimizing your intake of saturated fat. Eat a balanced diet with plenty of fruit and vegetables. Blood tests for lipids and cholesterol should begin at age 59 and be repeated every 5 years. If your lipid or cholesterol levels are high, you are over 50, or you are at high risk for heart disease, you may need your cholesterol levels checked more frequently. Ongoing high lipid and cholesterol levels should be treated with medicines if diet and exercise are not working.  . If you smoke, find out from your health Hanson provider how to quit. If you do not use tobacco, please do not start.  . If you choose to drink alcohol, please do not consume more than 2 drinks per  day. One drink is considered to be 12 ounces (355 mL) of beer, 5 ounces (148 mL) of wine, or 1.5 ounces (44 mL) of liquor.  . If you are 51-63 years old, ask your health Hanson provider if you should take aspirin to prevent strokes.  . Use sunscreen. Apply sunscreen liberally and repeatedly throughout the day. You should seek shade when your shadow is shorter than you. Protect yourself by wearing long sleeves, pants, a wide-brimmed hat, and sunglasses year round, whenever you are outdoors.  . Once a month, do a whole body  skin exam, using a mirror to look at the skin on your back. Tell your health [facility]on provider of new moles, moles that have irregular borders, moles that are larger than a pencil eraser, or moles that have changed in shape or color.

## 2016-02-25 NOTE — Progress Notes (Addendum)
Subjective:   Stacey Hanson is a 70 y.o. female who presents for Medicare Annual (Subsequent) preventive examination.   Cardiac Risk Factors include: diabetes mellitus;dyslipidemia;hypertension;advanced age (>65mn, >>17women)     Objective:     Vitals: BP 138/62   Pulse 100   Resp 18   Ht _0  (1.727 m)   Wt 204 lb 1.9 oz (92.6 kg)   SpO2 98%   BMI 31.04 kg/m   Body mass index is 31.04 kg/m.   Tobacco History  Smoking Status  . Former Smoker  . Types: Cigarettes  . Quit date: 02/20/1985  Smokeless Tobacco  . Never Used       Past Medical History:  Diagnosis Date  . BMI 30.0-30.9,adult 2012 215 LBS   2004 198 LBS  . Diabetes mellitus   . 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 H. pylori infection 09/24/2010  . Hyperlipidemia   . Hypertension   . IDDM [place] mellitus) [place]  . Knee pain   . Obesity    Past Surgical History:  Procedure Laterality Date  . ABDOMINAL HYSTERECTOMY  1970  . CHOLECYSTECTOMY  OCT 2010 BZ BILIARY DYSKINESIA   CHRONIC CHOLECYSTITIS  . COLONOSCOPY  MAY 2009 SCREENING   pTC TICS, SML IH  . ESOPHAGOGASTRODUODENOSCOPY  10/03/2010   Procedure: ESOPHAGOGASTRODUODENOSCOPY (EGD);  Surgeon: SDorothyann Peng MD;  [facility];  Service: Endoscopy;  Laterality: N/A;  . ESOPHAGOGASTRODUODENOSCOPY  12/19/2002   RMR: Normal esophagus/Normal stomach/ Duodenum large bulbar diverticulum, 1 cm bulbar ulcer with surrounding/ inflammation as described above. Normal D2  . SAVORY DILATION  10/03/2010   Procedure: SAVORY DILATION;  Surgeon: SDorothyann Peng MD;  [facility];  Service: Endoscopy;;  . TOTAL ABDOMINAL HYSTERECTOMY  1970  . UPPER GASTROINTESTINAL ENDOSCOPY  OCT 2004 RMR   DUO TIC, & ULCER   Family History  Problem Relation Age of Onset  . Diabetes Mother   . Hypertension Mother   . Heart failure Mother   . Cancer Father 82   prostate and colon  . Diabetes Sister   .  Hypertension Sister   . Hypertension Sister   . Diabetes Sister   . Diabetes Brother   . Hypertension Brother   . Diabetes Brother   . Hypertension Brother   . Coronary artery disease Brother 35    MI  . Colon cancer Neg Hx   . Colon polyps Neg Hx    History  Sexual Activity  . Sexual activity: Not Currently    Outpatient Encounter Prescriptions as of 02/25/2016  Medication Sig  . ASPIRIN LOW DOSE 81 MG EC tablet TAKE 1 TABLET(81 MG) BY MOUTH DAILY  . B-D UF III MINI PEN NEEDLES 31G X 5 MM MISC USE FOR TWICE DAILY INJECTIONS OF HUMALOG  . benazepril (LOTENSIN) 40 MG tablet TAKE 1 TABLET(40 MG) BY MOUTH DAILY  . Blood Glucose Monitoring Suppl ([place] by Does not apply route 2 (two) times daily. Two times a day testing   . Calcium Carbonate-Vitamin D (CALCIUM PLUS VITAMIN D PO) Take by mouth 2 (two) times daily. Take two tablets daily   . glucose blood (ONE TOUCH ULTRA TEST) test strip Use as instructed twice daily dx 250.01  . glucose blood (TRUETRACK TEST) test strip 1 each by Other route 2 (two) times daily. Two times a day   . hydrochlorothiazide (HYDRODIURIL) 25 MG tablet TAKE 1 TABLET(25  MG) BY MOUTH DAILY  . ibuprofen (ADVIL,MOTRIN) 800 MG tablet One tablet twice daily for 1 week  . Insulin Lispro Prot & Lispro (HUMALOG MIX 75/25 KWIKPEN) (75-25) 100 UNIT/ML Kwikpen INJECT 40 UNITS INTO THE SKIN TWICE DAILY.  Marland Kitchen levothyroxine (SYNTHROID, LEVOTHROID) 88 MCG tablet TAKE 1 TABLET BY MOUTH EVERY DAY IN THE MORNING  . loratadine (CLARITIN) 10 MG tablet Take 1 tablet (10 mg total) by mouth daily.  . metFORMIN (GLUCOPHAGE) 1000 MG tablet Take 1 tablet (1,000 mg total) by mouth 2 (two) times daily with a meal.  . montelukast (SINGULAIR) 10 MG tablet Take 1 tablet (10 mg total) by mouth at bedtime.  . Multiple Vitamin (MULTIVITAMIN) capsule Take 1 capsule by mouth daily.    Marland Kitchen omeprazole (PRILOSEC) 20 MG capsule TAKE 1 CAPSULE BY MOUTH EVERY MORNING  . ONETOUCH  DELICA LANCETS 52W MISC Use as directed twice daily dx 250.01  . pravastatin (PRAVACHOL) 40 MG tablet TAKE 1 TABLET(40 MG) BY MOUTH DAILY   No facility-administered encounter medications on file as of 02/25/2016.     Activities of Daily Living In your present state of health, do you have any difficulty performing the following activities: 02/25/2016  Hearing? N  Vision? N  Difficulty concentrating or making decisions? Y  Walking or climbing stairs? Y  Dressing or bathing? N  Doing errands, shopping? N  Preparing Food and eating ? N  Using the Toilet? N  In the past six months, have you accidently leaked urine? N  Do you have problems with loss of bowel control? N  Managing your Medications? N  Managing your Finances? N  Housekeeping or managing your Housekeeping? N  Some recent data might be hidden    Patient Care Team: Fayrene Helper, MD as PCP - General Danie Binder, MD (Gastroenterology) Leta Baptist, MD as Attending Physician (Otolaryngology) Cassandria Anger, MD (Endocrinology) Carole Civil, MD as Consulting Physician (Orthopedic Surgery) Madelin Headings, DO (Optometry)    Assessment:    Medicare annual wellness visit, subsequent Annual exam as documented. Counseling done  re healthy lifestyle involving commitment to 150 minutes exercise per week, heart healthy diet, and attaining healthy weight.The importance of adequate sleep also discussed. Regular seat belt use and home safety, is also discussed. Changes in health habits are decided on by the patient with goals and time frames  set for achieving them. Immunization and cancer screening needs are specifically addressed at this visit.     Exercise Activities and Dietary recommendations Current Exercise Habits: Structured exercise class, Type of exercise: stretching, Time (Minutes): 30, Frequency (Times/Week): 3, Weekly Exercise (Minutes/Week): 90, Intensity: Moderate  Goals    None     Fall Risk Fall  Risk  07/24/2015 02/21/2015 02/19/2014 10/11/2013 10/03/2012  Falls in the past year? _0   Risk for fall due to : - - - - Impaired mobility;Impaired balance/gait   Depression Screen PHQ 2/9 Scores 07/24/2015 02/21/2015 10/11/2013 10/03/2012  PHQ - 2 Score 0 4 2 0  PHQ- 9 Score - 11 9 -     Cognitive Function     6CIT Screen 02/25/2016  What Year? 0 points  What month? 0 points  What time? 0 points  Count back from 20 0 points  Months in reverse 0 points  Repeat phrase 0 points  Total Score 0    Immunization History  Administered Date(s) Administered  . Influenza,inj,Quad PF,36+ Mos 11/16/2012, 11/15/2013, 11/14/2014, 11/14/2015  . Pneumococcal Conjugate-13 10/11/2013  .  Pneumococcal Polysaccharide-23 10/07/2011  . Zoster 04/05/2006   Screening Tests Health Maintenance  Topic Date Due  . HEMOGLOBIN A1C  05/08/2016  . OPHTHALMOLOGY EXAM  10/01/2016  . FOOT EXAM  10/05/2016  . MAMMOGRAM  07/09/2017  . COLONOSCOPY  07/10/2017  . TETANUS/TDAP  08/01/2019  . INFLUENZA VACCINE  Completed  . DEXA SCAN  Completed  . ZOSTAVAX  Completed  . Hepatitis C Screening  Completed  . PNA vac Low Risk Adult  Completed      Plan:   During the course of the visit the patient was educated and counseled about the following appropriate screening and preventive services:   Vaccines to include Pneumoccal, Influenza, Hepatitis B, Td, Zostavax, HCV  Electrocardiogram  Cardiovascular Disease  Colorectal cancer screening  Bone density screening  Diabetes screening  Glaucoma screening  Mammography/PAP  Nutrition counseling    Patient given information regarding DEXA she will consider this and let PCP know at next visit  Patient Instructions (the written plan) was given to the patient.   Denman George [place], [place]  [id number]

## 2016-03-12 ENCOUNTER — Other Ambulatory Visit: Payer: Self-pay | Admitting: "Endocrinology

## 2016-03-12 DIAGNOSIS — E890 Postprocedural hypothyroidism: Secondary | ICD-10-CM | POA: Diagnosis not present

## 2016-03-12 DIAGNOSIS — E0865 Diabetes mellitus due to underlying condition with hyperglycemia: Secondary | ICD-10-CM | POA: Diagnosis not present

## 2016-03-12 DIAGNOSIS — I10 Essential (primary) hypertension: Secondary | ICD-10-CM | POA: Diagnosis not present

## 2016-03-12 DIAGNOSIS — E08618 Diabetes mellitus due to underlying condition with other diabetic arthropathy: Secondary | ICD-10-CM | POA: Diagnosis not present

## 2016-03-12 DIAGNOSIS — Z794 Long term (current) use of insulin: Secondary | ICD-10-CM | POA: Diagnosis not present

## 2016-03-12 LAB — COMPLETE METABOLIC PANEL WITH GFR
ALBUMIN: 4.1 g/dL (ref 3.6–5.1)
ALT: 23 U/L (ref 6–29)
AST: 30 U/L (ref 10–35)
Alkaline Phosphatase: 76 U/L (ref 33–130)
BILIRUBIN TOTAL: 0.4 mg/dL (ref 0.2–1.2)
BUN: 21 mg/dL (ref 7–25)
CALCIUM: 9.7 mg/dL (ref 8.6–10.4)
CHLORIDE: 100 mmol/L (ref 98–110)
CO2: 23 mmol/L (ref 20–31)
CREATININE: 0.96 mg/dL — AB (ref 0.60–0.93)
GFR, Est African American: 69 mL/min (ref >=60)
GFR, Est Non African American: 60 mL/min (ref >=60)
Glucose, Bld: 41 mg/dL — ABNORMAL LOW (ref 65–99)
Potassium: 4.2 mmol/L (ref 3.5–5.3)
Sodium: 137 mmol/L (ref 135–146)
TOTAL PROTEIN: 7.6 g/dL (ref 6.1–8.1)

## 2016-03-12 LAB — MICROALBUMIN / CREATININE URINE RATIO
Creatinine, Urine: 108 mg/dL (ref 20–320)
MICROALB/CREAT RATIO: 4 ug/mg{creat} (ref <30)
Microalb, Ur: 0.4 mg/dL

## 2016-03-12 LAB — T4, FREE: Free T4: 1.6 ng/dL (ref 0.8–1.8)

## 2016-03-12 LAB — TSH: TSH: 1.06 mIU/L

## 2016-03-13 ENCOUNTER — Telehealth: Payer: Self-pay

## 2016-03-13 DIAGNOSIS — E0865 Diabetes mellitus due to underlying condition with hyperglycemia: Secondary | ICD-10-CM

## 2016-03-13 DIAGNOSIS — Z794 Long term (current) use of insulin: Secondary | ICD-10-CM

## 2016-03-13 DIAGNOSIS — E890 Postprocedural hypothyroidism: Secondary | ICD-10-CM

## 2016-03-13 DIAGNOSIS — IMO0002 Reserved for concepts with insufficient information to code with codable children: Secondary | ICD-10-CM

## 2016-03-13 DIAGNOSIS — E7849 Other hyperlipidemia: Secondary | ICD-10-CM

## 2016-03-13 DIAGNOSIS — I10 Essential (primary) hypertension: Secondary | ICD-10-CM

## 2016-03-13 DIAGNOSIS — E08618 Diabetes mellitus due to underlying condition with other diabetic arthropathy: Secondary | ICD-10-CM

## 2016-03-13 LAB — HEMOGLOBIN A1C
HEMOGLOBIN A1C: 6.2 % — AB (ref <5.7)
Mean Plasma Glucose: 131 mg/dL

## 2016-03-13 NOTE — Telephone Encounter (Signed)
-----  Message from Fayrene Helper, MD sent at 03/13/2016  6:37 AM EST ----- PLS explain needs to drink orange juice or have clear sweet drink like lemonade, even when going for fasting blood sugar, explain danger of low blood sugar  (1) EXCELLENT blood sugar control, I recommend she reduce insulin to 35 units twice daily and continue healthy lifestyle changes. Needs fasting lipid, cmp and EGFr, HBA1C in 3.5 to max of 4 months and appt with me at that time , if none in system please ?? pls ask!

## 2016-03-19 ENCOUNTER — Ambulatory Visit: Payer: Medicare Other | Admitting: "Endocrinology

## 2016-03-25 ENCOUNTER — Telehealth: Payer: Self-pay

## 2016-03-25 DIAGNOSIS — I10 Essential (primary) hypertension: Secondary | ICD-10-CM

## 2016-03-25 DIAGNOSIS — E08618 Diabetes mellitus due to underlying condition with other diabetic arthropathy: Secondary | ICD-10-CM

## 2016-03-25 DIAGNOSIS — E0865 Diabetes mellitus due to underlying condition with hyperglycemia: Principal | ICD-10-CM

## 2016-03-25 DIAGNOSIS — E7849 Other hyperlipidemia: Secondary | ICD-10-CM

## 2016-03-25 DIAGNOSIS — IMO0002 Reserved for concepts with insufficient information to code with codable children: Secondary | ICD-10-CM

## 2016-03-25 DIAGNOSIS — Z794 Long term (current) use of insulin: Principal | ICD-10-CM

## 2016-03-25 NOTE — Telephone Encounter (Signed)
Labs re ordered due to need to have done at [place]

## 2016-04-08 ENCOUNTER — Encounter: Payer: Self-pay | Admitting: "Endocrinology

## 2016-04-08 ENCOUNTER — Ambulatory Visit (INDEPENDENT_AMBULATORY_CARE_PROVIDER_SITE_OTHER): Payer: Medicare Other | Admitting: "Endocrinology

## 2016-04-08 VITALS — BP 126/77 | HR 93 | Ht 69.0 in | Wt 208.0 lb

## 2016-04-08 DIAGNOSIS — E0865 Diabetes mellitus due to underlying condition with hyperglycemia: Secondary | ICD-10-CM | POA: Diagnosis not present

## 2016-04-08 DIAGNOSIS — I10 Essential (primary) hypertension: Secondary | ICD-10-CM | POA: Diagnosis not present

## 2016-04-08 DIAGNOSIS — E08618 Diabetes mellitus due to underlying condition with other diabetic arthropathy: Secondary | ICD-10-CM | POA: Diagnosis not present

## 2016-04-08 DIAGNOSIS — E890 Postprocedural hypothyroidism: Secondary | ICD-10-CM

## 2016-04-08 DIAGNOSIS — Z794 Long term (current) use of insulin: Secondary | ICD-10-CM

## 2016-04-08 DIAGNOSIS — E782 Mixed hyperlipidemia: Secondary | ICD-10-CM | POA: Diagnosis not present

## 2016-04-08 DIAGNOSIS — IMO0002 Reserved for concepts with insufficient information to code with codable children: Secondary | ICD-10-CM

## 2016-04-08 NOTE — Progress Notes (Signed)
Subjective:    Patient ID: Stacey Hanson, female    DOB: [birth year], PCP Tula Nakayama, MD   Past Medical History:  Diagnosis Date  . BMI 30.0-30.9,adult 2012 215 LBS   2004 198 LBS  . Diabetes mellitus   . 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 H. pylori infection 09/24/2010  . Hyperlipidemia   . Hypertension   . IDDM (insulin dependent diabetes mellitus) ([place]) 1987  . Knee pain   . Obesity    Past Surgical History:  Procedure Laterality Date  . ABDOMINAL HYSTERECTOMY  1970  . CHOLECYSTECTOMY  OCT 2010 BZ BILIARY DYSKINESIA   CHRONIC CHOLECYSTITIS  . COLONOSCOPY  MAY 2009 SCREENING   pTC TICS, SML IH  . ESOPHAGOGASTRODUODENOSCOPY  10/03/2010   Procedure: ESOPHAGOGASTRODUODENOSCOPY (EGD);  Surgeon: Dorothyann Peng, MD;  [facility];  Service: Endoscopy;  Laterality: N/A;  . ESOPHAGOGASTRODUODENOSCOPY  12/19/2002   RMR: Normal esophagus/Normal stomach/ Duodenum large bulbar diverticulum, 1 cm bulbar ulcer with surrounding/ inflammation as described above. Normal D2  . SAVORY DILATION  10/03/2010   Procedure: SAVORY DILATION;  Surgeon: Dorothyann Peng, MD;  [facility];  Service: Endoscopy;;  . TOTAL ABDOMINAL HYSTERECTOMY  1970  . UPPER GASTROINTESTINAL ENDOSCOPY  OCT 2004 RMR   DUO TIC, & ULCER   Social History   Social History  . Marital status: Single    Spouse name: N/A  . Number of children: N/A  . Years of education: N/A   Occupational History  . [employer]OR    Social History Main Topics  . Smoking status: Former Smoker    Types: Cigarettes    Quit date: 02/20/1985  . Smokeless tobacco: Never Used  . Alcohol use No  . Drug use: No  . Sexual activity: Not Currently   Other Topics Concern  . None   Social History Narrative  . None   Outpatient Encounter Prescriptions as of 04/08/2016  Medication Sig  . ASPIRIN LOW DOSE 81 MG EC tablet TAKE 1 TABLET(81 MG) BY MOUTH DAILY  . benazepril (LOTENSIN) 40 MG tablet TAKE  1 TABLET(40 MG) BY MOUTH DAILY  . Calcium Carbonate-Vitamin D (CALCIUM PLUS VITAMIN D PO) Take by mouth 2 (two) times daily. Take two tablets daily   . Insulin Lispro Prot & Lispro (HUMALOG MIX 75/25 KWIKPEN) (75-25) 100 UNIT/ML Kwikpen INJECT 40 UNITS INTO THE SKIN TWICE DAILY.  Marland Kitchen levothyroxine (SYNTHROID, LEVOTHROID) 88 MCG tablet TAKE 1 TABLET BY MOUTH EVERY DAY IN THE MORNING  . loratadine (CLARITIN) 10 MG tablet Take 1 tablet (10 mg total) by mouth daily.  . Multiple Vitamin (MULTIVITAMIN) capsule Take 1 capsule by mouth daily.    Marland Kitchen omeprazole (PRILOSEC) 20 MG capsule TAKE 1 CAPSULE BY MOUTH EVERY MORNING  . pravastatin (PRAVACHOL) 40 MG tablet TAKE 1 TABLET(40 MG) BY MOUTH DAILY  . B-D UF III MINI PEN NEEDLES 31G X 5 MM MISC USE FOR TWICE DAILY INJECTIONS OF HUMALOG  . Blood Glucose Monitoring Suppl ([place]) KIT by Does not apply route 2 (two) times daily. Two times a day testing   . glucose blood (ONE TOUCH ULTRA TEST) test strip Use as instructed twice daily dx 250.01  . glucose blood (TRUETRACK TEST) test strip 1 each by Other route 2 (two) times daily. Two times a day   . metFORMIN (GLUCOPHAGE) 1000 MG tablet Take 1 tablet (1,000 mg total) by mouth 2 (two) times daily with a  meal.  . ONETOUCH DELICA LANCETS 65K MISC Use as directed twice daily dx 250.01  . [DISCONTINUED] hydrochlorothiazide (HYDRODIURIL) 25 MG tablet TAKE 1 TABLET(25 MG) BY MOUTH DAILY (Patient not taking: Reported on 04/08/2016)  . [DISCONTINUED] ibuprofen (ADVIL,MOTRIN) 800 MG tablet One tablet twice daily for 1 week (Patient not taking: Reported on 04/08/2016)  . [DISCONTINUED] montelukast (SINGULAIR) 10 MG tablet Take 1 tablet (10 mg total) by mouth at bedtime. (Patient not taking: Reported on 04/08/2016)   No facility-administered encounter medications on file as of 04/08/2016.    ALLERGIES: Allergies  Allergen Reactions  . Simvastatin     REACTION: muscle cramps   VACCINATION STATUS: Immunization  History  Administered Date(s) Administered  . Influenza,inj,Quad PF,36+ Mos 11/16/2012, 11/15/2013, 11/14/2014, 11/14/2015  . Pneumococcal Conjugate-13 10/11/2013  . Pneumococcal Polysaccharide-23 10/07/2011  . Zoster 04/05/2006    Hypertension  Pertinent negatives include no chest pain, headaches, palpitations or shortness of breath.  Diabetes  Pertinent negatives for hypoglycemia include no confusion, headaches, pallor or seizures. Associated symptoms include fatigue. Pertinent negatives for diabetes include no chest pain, no polydipsia, no polyphagia and no polyuria.   71 yr old female with medical hx of type 2 DM, HTN,  RAI induced hypothyroidism, Obesity.   She was given RAI for toxic  On 10/17/2010. She has hx of multinodular goiter s/p  FNA was benign. She returns for f/u  of her RAI induced hypothyroidism.  She denies heat intolerance/ cold intolerance. She has gained 4 pounds since last visit.  She came with better a1c of 6.2% improving from 8.3%.  no dysphagia, SOB, nor voice change. she iso n Lt4 65mg po qam.   Review of Systems  Constitutional: Positive for fatigue. Negative for chills, fever and unexpected weight change.  HENT: Negative for trouble swallowing and voice change.   Eyes: Negative for visual disturbance.  Respiratory: Negative for cough, shortness of breath and wheezing.   Cardiovascular: Negative for chest pain, palpitations and leg swelling.  Gastrointestinal: Negative for diarrhea, nausea and vomiting.  Endocrine: Negative for cold intolerance, heat intolerance, polydipsia, polyphagia and polyuria.  Musculoskeletal: Negative for arthralgias and myalgias.  Skin: Negative for color change, pallor, rash and wound.  Neurological: Negative for seizures and headaches.  Psychiatric/Behavioral: Negative for confusion and suicidal ideas.    Objective:    BP 126/77   Pulse 93   Ht '5\' 9"'  (1.753 m)   Wt 208 lb (94.3 kg)   BMI 30.72 kg/m   Wt Readings  from Last 3 Encounters:  04/08/16 208 lb (94.3 kg)  02/25/16 204 lb 1.9 oz (92.6 kg)  02/13/16 202 lb 12.8 oz (92 kg)    Physical Exam  Constitutional: She is oriented to person, place, and time. She appears well-developed.  HENT:  Head: Normocephalic and atraumatic.  Eyes: EOM are normal.  Neck: Normal range of motion. Neck supple. No tracheal deviation present. No thyromegaly present.  Cardiovascular: Normal rate and regular rhythm.   Pulmonary/Chest: Effort normal and breath sounds normal.  Abdominal: Soft. Bowel sounds are normal. There is no tenderness. There is no guarding.  Musculoskeletal: Normal range of motion. She exhibits no edema.  Neurological: She is alert and oriented to person, place, and time. She has normal reflexes. No cranial nerve deficit. Coordination normal.  Skin: Skin is warm and dry. No rash noted. No erythema. No pallor.  Psychiatric: She has a normal mood and affect. Judgment normal.    CMP     Component Value Date/Time  NA 137 03/12/2016 0925   K 4.2 03/12/2016 0925   CL 100 03/12/2016 0925   CO2 23 03/12/2016 0925   GLUCOSE 41 (L) 03/12/2016 0925   BUN 21 03/12/2016 0925   CREATININE 0.96 (H) 03/12/2016 0925   CALCIUM 9.7 03/12/2016 0925   PROT 7.6 03/12/2016 0925   ALBUMIN 4.1 03/12/2016 0925   AST 30 03/12/2016 0925   ALT 23 03/12/2016 0925   ALKPHOS 76 03/12/2016 0925   BILITOT 0.4 03/12/2016 0925   GFRNONAA 60 03/12/2016 0925   GFRAA 69 03/12/2016 0925     Diabetic Labs (most recent): Lab Results  Component Value Date   HGBA1C 6.2 (H) 03/12/2016   HGBA1C 8.3 (H) 11/09/2015   HGBA1C 8.0 (H) 09/28/2015     Lipid Panel ( most recent) Lipid Panel     Component Value Date/Time   CHOL 162 01/11/2016 0916   TRIG 55 01/11/2016 0916   HDL 73 01/11/2016 0916   CHOLHDL 2.2 01/11/2016 0916   VLDL 11 01/11/2016 0916   LDLCALC 78 01/11/2016 0916   - She has not done thyroid function tests since last visit.   Assessment & Plan:     1. Hypothyroidism, postradioiodine therapy -  I have advised her to continue levothyroxine 88 g by mouth every morning.   - We discussed about correct intake of levothyroxine, at fasting, with water, separated by at least 30 minutes from breakfast, and separated by more than 4 hours from calcium, iron, multivitamins, acid reflux medications (PPIs). -Patient is made aware of the fact that thyroid hormone replacement is needed for life, dose to be adjusted by periodic monitoring of thyroid function tests.   2. Uncontrolled type 2 diabetes mellitus with stage 3 chronic kidney disease, with long-term current use of insulin (HCC)  - Her A1c is  6.2% improving from 8.3%. She insists that she will address that with Dr. Moshe Cipro. I advised patient to continue Humalog 75/25  35 units with breakfast and supper when pre-meal blood glucose is above 90 mg/dL. I have Given her detailed carbs and exercise regimen. I advised her to continue metformin 500 mg by mouth twice a day given rising serum creatinine and declining GFR.  3. Essential hypertension, benign - Uncontrolled, I advised her to be consistent in her blood pressure medications including benazepril 40 mg by mouth daily and hydrochlorothiazide 25 mg by mouth daily. Salt restriction advised.   4. Hyperlipidemia:  I advised her to continue pravastatin 40 mg by mouth daily at bedtime. Side effects and precautions discussed with her. -She will need fasting lipid panel on subsequent visits.  - 25 minutes of time was spent on the care of this patient , 50% of which was applied for counseling on diabetes complications and their preventions.  - I advised patient to maintain close follow up with Tula Nakayama, MD for primary care needs. Follow up plan: Return in about 1 year (around 04/08/2017) for follow up with pre-visit labs.  Glade Lloyd, MD Phone: [phone]  Fax: [phone]   04/08/2016, 11:13 AM

## 2016-04-08 NOTE — Patient Instructions (Signed)

## 2016-04-20 ENCOUNTER — Other Ambulatory Visit: Payer: Self-pay | Admitting: Family Medicine

## 2016-05-08 ENCOUNTER — Other Ambulatory Visit: Payer: Self-pay | Admitting: "Endocrinology

## 2016-05-18 ENCOUNTER — Other Ambulatory Visit: Payer: Self-pay | Admitting: Family Medicine

## 2016-06-03 ENCOUNTER — Other Ambulatory Visit: Payer: Self-pay | Admitting: Family Medicine

## 2016-06-03 DIAGNOSIS — Z1231 Encounter for screening mammogram for malignant neoplasm of breast: Secondary | ICD-10-CM

## 2016-06-19 DIAGNOSIS — E784 Other hyperlipidemia: Secondary | ICD-10-CM | POA: Diagnosis not present

## 2016-06-19 DIAGNOSIS — E08618 Diabetes mellitus due to underlying condition with other diabetic arthropathy: Secondary | ICD-10-CM | POA: Diagnosis not present

## 2016-06-19 DIAGNOSIS — I10 Essential (primary) hypertension: Secondary | ICD-10-CM | POA: Diagnosis not present

## 2016-06-19 DIAGNOSIS — Z794 Long term (current) use of insulin: Secondary | ICD-10-CM | POA: Diagnosis not present

## 2016-06-19 DIAGNOSIS — E0865 Diabetes mellitus due to underlying condition with hyperglycemia: Secondary | ICD-10-CM | POA: Diagnosis not present

## 2016-06-20 LAB — LIPID PANEL
CHOLESTEROL TOTAL: 176 mg/dL (ref 100–199)
Chol/HDL Ratio: 2.5 ratio (ref 0.0–4.4)
HDL: 70 mg/dL (ref >39)
LDL CALC: 91 mg/dL (ref 0–99)
Triglycerides: 77 mg/dL (ref 0–149)
VLDL Cholesterol Cal: 15 mg/dL (ref 5–40)

## 2016-06-20 LAB — CMP14+EGFR
ALBUMIN: 4.8 g/dL (ref 3.5–4.8)
ALK PHOS: 87 IU/L (ref 39–117)
ALT: 18 IU/L (ref 0–32)
AST: 29 IU/L (ref 0–40)
Albumin/Globulin Ratio: 1.5 (ref 1.2–2.2)
BUN / CREAT RATIO: 24 (ref 12–28)
BUN: 21 mg/dL (ref 8–27)
Bilirubin Total: 0.3 mg/dL (ref 0.0–1.2)
CHLORIDE: 98 mmol/L (ref 96–106)
CO2: 23 mmol/L (ref 18–29)
Calcium: 10.2 mg/dL (ref 8.7–10.3)
Creatinine, Ser: 0.89 mg/dL (ref 0.57–1.00)
GFR calc Af Amer: 76 mL/min/{1.73_m2} (ref >59)
GFR calc non Af Amer: 66 mL/min/{1.73_m2} (ref >59)
GLOBULIN, TOTAL: 3.2 g/dL (ref 1.5–4.5)
Glucose: 68 mg/dL (ref 65–99)
POTASSIUM: 4.4 mmol/L (ref 3.5–5.2)
SODIUM: 142 mmol/L (ref 134–144)
Total Protein: 8 g/dL (ref 6.0–8.5)

## 2016-06-20 LAB — HEMOGLOBIN A1C
Est. average glucose Bld gHb Est-mCnc: 154 mg/dL
Hgb A1c MFr Bld: 7 % — ABNORMAL HIGH (ref 4.8–5.6)

## 2016-06-22 ENCOUNTER — Encounter: Payer: Self-pay | Admitting: Family Medicine

## 2016-06-22 ENCOUNTER — Ambulatory Visit (INDEPENDENT_AMBULATORY_CARE_PROVIDER_SITE_OTHER): Payer: Medicare Other | Admitting: Family Medicine

## 2016-06-22 VITALS — BP 120/80 | HR 106 | Resp 16 | Ht 69.0 in | Wt 211.4 lb

## 2016-06-22 DIAGNOSIS — E119 Type 2 diabetes mellitus without complications: Secondary | ICD-10-CM | POA: Diagnosis not present

## 2016-06-22 DIAGNOSIS — E782 Mixed hyperlipidemia: Secondary | ICD-10-CM | POA: Diagnosis not present

## 2016-06-22 DIAGNOSIS — K219 Gastro-esophageal reflux disease without esophagitis: Secondary | ICD-10-CM | POA: Diagnosis not present

## 2016-06-22 DIAGNOSIS — E08618 Diabetes mellitus due to underlying condition with other diabetic arthropathy: Secondary | ICD-10-CM | POA: Diagnosis not present

## 2016-06-22 DIAGNOSIS — I10 Essential (primary) hypertension: Secondary | ICD-10-CM

## 2016-06-22 DIAGNOSIS — Z794 Long term (current) use of insulin: Secondary | ICD-10-CM

## 2016-06-22 DIAGNOSIS — IMO0001 Reserved for inherently not codable concepts without codable children: Secondary | ICD-10-CM

## 2016-06-22 DIAGNOSIS — E0865 Diabetes mellitus due to underlying condition with hyperglycemia: Secondary | ICD-10-CM

## 2016-06-22 DIAGNOSIS — J3089 Other allergic rhinitis: Secondary | ICD-10-CM | POA: Diagnosis not present

## 2016-06-22 DIAGNOSIS — E890 Postprocedural hypothyroidism: Secondary | ICD-10-CM

## 2016-06-22 DIAGNOSIS — IMO0002 Reserved for concepts with insufficient information to code with codable children: Secondary | ICD-10-CM

## 2016-06-22 MED ORDER — BENAZEPRIL HCL 40 MG PO TABS: ORAL_TABLET | ORAL | 1 refills | Status: DC

## 2016-06-22 MED ORDER — LEVOTHYROXINE SODIUM 88 MCG PO TABS: ORAL_TABLET | ORAL | 5 refills | Status: DC

## 2016-06-22 MED ORDER — PRAVASTATIN SODIUM 40 MG PO TABS: ORAL_TABLET | ORAL | 1 refills | Status: DC

## 2016-06-22 MED ORDER — OLOPATADINE HCL 0.1 % OP SOLN: 1.0000 [drp] | Freq: Two times a day (BID) | OPHTHALMIC | 3 refills | Status: DC

## 2016-06-22 MED ORDER — OLOPATADINE HCL 0.1 % OP SOLN: 1.0000 [drp] | Freq: Two times a day (BID) | OPHTHALMIC | 2 refills | Status: DC

## 2016-06-22 NOTE — Progress Notes (Signed)
Stacey Hanson     MRN: [id number]      DOB: 03-21-1945   HPI Stacey Hanson is here for follow up and re-evaluation of chronic medical conditions, medication management and review of any available recent lab and radiology data.  Preventive health is updated, specifically  Cancer screening and Immunization.   Questions or concerns regarding consultations or procedures which the PT has had in the interim are  addressed. The PT denies any adverse reactions to current medications since the last visit.  There are no new concerns.  Denies polyuria, polydipsia, blurred vision , or hypoglycemic episodes.   ROS Denies recent fever or chills. Denies sinus pressure, c/o increased watery eyes and  nasal congestion,denies  ear pain or sore throat. Denies chest congestion, productive cough or wheezing. Denies chest pains, palpitations and leg swelling Denies abdominal pain, nausea, vomiting,diarrhea or constipation.   Denies dysuria, frequency, hesitancy or incontinence. Denies joint pain, swelling and limitation in mobility. Denies headaches, seizures, numbness, or tingling. Denies depression, anxiety or insomnia. Denies skin break down or rash.   PE  BP 120/80   Pulse (!) 106   Resp 16   Ht 5\' 9"  (1.753 m)   Wt 211 lb 6.4 oz (95.9 kg)   SpO2 97%   BMI 31.22 kg/m   Patient alert and oriented and in no cardiopulmonary distress.  HEENT: No facial asymmetry, EOMI,   oropharynx pink and moist.  Neck supple no JVD, no mass.No sinus tenderness, , erythema and  Clear drainage from left eye  Chest: Clear to auscultation bilaterally.  CVS: S1, S2 no murmurs, no S3.Regular rate.  ABD: Soft non tender.   Ext: No edema  MS: Adequate ROM spine, shoulders, hips and knees.  Skin: Intact, no ulcerations or rash noted.  Psych: Good eye contact, normal affect. Memory intact not anxious or depressed appearing.  CNS: CN 2-12 intact, power,  normal throughout.no focal deficits  noted.   Assessment & Plan  Diabetes mellitus, insulin dependent (IDDM), controlled (HCC) Controlled, no change in medication Stacey Hanson is reminded of the importance of commitment to daily physical activity for 30 minutes or more, as able and the need to limit carbohydrate intake to 30 to 60 grams per meal to help with blood sugar control.   The need to take medication as prescribed, test blood sugar as directed, and to call between visits if there is a concern that blood sugar is uncontrolled is also discussed.   Stacey Hanson is reminded of the importance of daily foot exam, annual eye examination, and good blood sugar, blood pressure and cholesterol control.  Diabetic Labs Latest Ref Rng & Units 06/19/2016 03/12/2016 01/11/2016 11/09/2015 09/28/2015  HbA1c 4.8 - 5.6 % 7.0(H) 6.2(H) - 8.3(H) 8.0(H)  Microalbumin Not estab mg/dL - 0.4 - - -  Micro/Creat Ratio <30 mcg/mg creat - 4 - - -  Chol 100 - 199 mg/dL 176 - 162 - -  HDL >39 mg/dL 70 - 73 - -  Calc LDL 0 - 99 mg/dL 91 - 78 - -  Triglycerides 0 - 149 mg/dL 77 - 55 - -  Creatinine 0.57 - 1.00 mg/dL 0.89 0.96(H) 0.94(H) 0.94(H) 1.03(H)   BP/Weight 06/22/2016 04/08/2016 02/25/2016 02/13/2016 01/16/2016 01/07/2016 7/91/5056  Systolic BP 979 480 165 537 482 707 867  Diastolic BP 80 77 62 68 78 67 74  Wt. (Lbs) 211.4 208 204.12 202.8 203 204 207  BMI 31.22 30.72 31.04 29.95 30.87 31.02 30.57   Foot/eye exam  completion dates Latest Ref Rng & Units 10/03/2015 09/27/2014  Eye Exam No Retinopathy - -  Foot exam Order - - -  Foot Form Completion - Done Done        ESSENTIAL HYPERTENSION, BENIGN Controlled, no change in medication DASH diet and commitment to daily physical activity for a minimum of 30 minutes discussed and encouraged, as a part of hypertension management. The importance of attaining a healthy weight is also discussed.  BP/Weight 06/22/2016 04/08/2016 02/25/2016 02/13/2016 01/16/2016 01/07/2016 2/48/2500  Systolic BP 370 488 891  694 503 888 280  Diastolic BP 80 77 62 68 78 67 74  Wt. (Lbs) 211.4 208 204.12 202.8 203 204 207  BMI 31.22 30.72 31.04 29.95 30.87 31.02 30.57       Hyperlipidemia Hyperlipidemia:Low fat diet discussed and encouraged.   Lipid Panel  Lab Results  Component Value Date   CHOL 176 06/19/2016   HDL 70 06/19/2016   LDLCALC 91 06/19/2016   TRIG 77 06/19/2016   CHOLHDL 2.5 06/19/2016    Controlled, no change in medication    Obesity Deteriorated. Patient re-educated about  the importance of commitment to a  minimum of 150 minutes of exercise per week.  The importance of healthy food choices with portion control discussed. Encouraged to start a food diary, count calories and to consider  joining a support group. Sample diet sheets offered. Goals set by the patient for the next several months.   Weight /BMI 06/22/2016 04/08/2016 02/25/2016  WEIGHT 211 lb 6.4 oz 208 lb 204 lb 1.9 oz  HEIGHT 5\' 9"  5\' 9"  5\' 8"   BMI 31.22 kg/m2 30.72 kg/m2 31.04 kg/m2      Seasonal allergies Uncontrolled with watery eyes left more than right , start patanol twice daily as needed  Hypothyroidism, postradioiodine therapy Managed by endo and controlled  GERD (gastroesophageal reflux disease) Controlled, no change in medication

## 2016-06-22 NOTE — Assessment & Plan Note (Signed)
Hyperlipidemia:Low fat diet discussed and encouraged.   Lipid Panel  Lab Results  Component Value Date   CHOL 176 06/19/2016   HDL 70 06/19/2016   LDLCALC 91 06/19/2016   TRIG 77 06/19/2016   CHOLHDL 2.5 06/19/2016    Controlled, no change in medication

## 2016-06-22 NOTE — Assessment & Plan Note (Signed)
Controlled, no change in medication  

## 2016-06-22 NOTE — Assessment & Plan Note (Signed)
Uncontrolled with watery eyes left more than right , start patanol twice daily as needed

## 2016-06-22 NOTE — Patient Instructions (Addendum)
Annual physical exam end August,or first week in September, call if you need me before.  Congrats on excellentblood pressure, and labs  New eye drop for runny , itchy left eye from allergies  It is important that you exercise regularly at least 45 minutes 4  times a week. If you develop chest pain, have severe difficulty breathing, or feel very tired, stop exercising immediately and seek medical attention    Sleep hygiene reviewed , NEED to turn off TV off for good sleep  Non fasting hBA1C and chem7 and EGFR 3 days before next vsiit  Thank you  for choosing [facility]. We consider it a privelige to serve you.  Delivering excellent health care in a caring and  compassionate way is our goal.  Partnering with you,  so that together we can achieve this goal is our strategy.   Aij to lose 6 pounds in next  5 months!

## 2016-06-22 NOTE — Assessment & Plan Note (Signed)
Deteriorated. Patient re-educated about  the importance of commitment to a  minimum of 150 minutes of exercise per week.  The importance of healthy food choices with portion control discussed. Encouraged to start a food diary, count calories and to consider  joining a support group. Sample diet sheets offered. Goals set by the patient for the next several months.   Weight /BMI 06/22/2016 04/08/2016 02/25/2016  WEIGHT 211 lb 6.4 oz 208 lb 204 lb 1.9 oz  HEIGHT 5\' 9"  5\' 9"  5\' 8"   BMI 31.22 kg/m2 30.72 kg/m2 31.04 kg/m2

## 2016-06-22 NOTE — Assessment & Plan Note (Signed)
Managed by endo and controlled 

## 2016-06-22 NOTE — Assessment & Plan Note (Signed)
Controlled, no change in medication Stacey Hanson is reminded of the importance of commitment to daily physical activity for 30 minutes or more, as able and the need to limit carbohydrate intake to 30 to 60 grams per meal to help with blood sugar control.   The need to take medication as prescribed, test blood sugar as directed, and to call between visits if there is a concern that blood sugar is uncontrolled is also discussed.   Stacey Hanson is reminded of the importance of daily foot exam, annual eye examination, and good blood sugar, blood pressure and cholesterol control.  Diabetic Labs Latest Ref Rng & Units 06/19/2016 03/12/2016 01/11/2016 11/09/2015 09/28/2015  HbA1c 4.8 - 5.6 % 7.0(H) 6.2(H) - 8.3(H) 8.0(H)  Microalbumin Not estab mg/dL - 0.4 - - -  Micro/Creat Ratio <30 mcg/mg creat - 4 - - -  Chol 100 - 199 mg/dL 176 - 162 - -  HDL >39 mg/dL 70 - 73 - -  Calc LDL 0 - 99 mg/dL 91 - 78 - -  Triglycerides 0 - 149 mg/dL 77 - 55 - -  Creatinine 0.57 - 1.00 mg/dL 0.89 0.96(H) 0.94(H) 0.94(H) 1.03(H)   BP/Weight 06/22/2016 04/08/2016 02/25/2016 02/13/2016 01/16/2016 01/07/2016 6/95/0722  Systolic BP 575 051 833 582 518 984 210  Diastolic BP 80 77 62 68 78 67 74  Wt. (Lbs) 211.4 208 204.12 202.8 203 204 207  BMI 31.22 30.72 31.04 29.95 30.87 31.02 30.57   Foot/eye exam completion dates Latest Ref Rng & Units 10/03/2015 09/27/2014  Eye Exam No Retinopathy - -  Foot exam Order - - -  Foot Form Completion - Done Done

## 2016-06-22 NOTE — Assessment & Plan Note (Signed)
Controlled, no change in medication DASH diet and commitment to daily physical activity for a minimum of 30 minutes discussed and encouraged, as a part of hypertension management. The importance of attaining a healthy weight is also discussed.  BP/Weight 06/22/2016 04/08/2016 02/25/2016 02/13/2016 01/16/2016 01/07/2016 8/54/6270  Systolic BP 350 093 818 299 371 696 789  Diastolic BP 80 77 62 68 78 67 74  Wt. (Lbs) 211.4 208 204.12 202.8 203 204 207  BMI 31.22 30.72 31.04 29.95 30.87 31.02 30.57

## 2016-07-10 ENCOUNTER — Ambulatory Visit (HOSPITAL_COMMUNITY)
Admission: RE | Admit: 2016-07-10 | Discharge: 2016-07-10 | Disposition: A | Discharge status: Home / Self Care | Payer: Medicare Other | Source: Ambulatory Visit | Attending: Family Medicine | Admitting: Family Medicine

## 2016-07-10 DIAGNOSIS — Z1231 Encounter for screening mammogram for malignant neoplasm of breast: Secondary | ICD-10-CM | POA: Diagnosis not present

## 2016-07-22 ENCOUNTER — Telehealth: Payer: Self-pay

## 2016-07-22 NOTE — Telephone Encounter (Signed)
[facility] called asking for a call back about this pt.  cb # 1 [place] # [phone]

## 2016-07-24 NOTE — Telephone Encounter (Signed)
Entered in error. Incorrect patient

## 2016-07-28 ENCOUNTER — Other Ambulatory Visit: Payer: Self-pay | Admitting: Family Medicine

## 2016-07-30 ENCOUNTER — Other Ambulatory Visit: Payer: Self-pay | Admitting: Family Medicine

## 2016-08-02 ENCOUNTER — Other Ambulatory Visit: Payer: Self-pay | Admitting: "Endocrinology

## 2016-08-03 ENCOUNTER — Other Ambulatory Visit: Payer: Self-pay

## 2016-08-06 ENCOUNTER — Other Ambulatory Visit: Payer: Self-pay

## 2016-08-06 MED ORDER — HYDROCHLOROTHIAZIDE 25 MG PO TABS: ORAL_TABLET | ORAL | 3 refills | Status: DC

## 2016-09-02 ENCOUNTER — Other Ambulatory Visit: Payer: Self-pay | Admitting: Gastroenterology

## 2016-09-28 ENCOUNTER — Other Ambulatory Visit: Payer: Self-pay | Admitting: Family Medicine

## 2016-10-02 DIAGNOSIS — H5201 Hypermetropia, right eye: Secondary | ICD-10-CM | POA: Diagnosis not present

## 2016-10-02 DIAGNOSIS — E119 Type 2 diabetes mellitus without complications: Secondary | ICD-10-CM | POA: Diagnosis not present

## 2016-10-02 DIAGNOSIS — H52223 Regular astigmatism, bilateral: Secondary | ICD-10-CM | POA: Diagnosis not present

## 2016-10-02 DIAGNOSIS — H524 Presbyopia: Secondary | ICD-10-CM | POA: Diagnosis not present

## 2016-10-29 ENCOUNTER — Other Ambulatory Visit: Payer: Self-pay | Admitting: Family Medicine

## 2016-10-29 DIAGNOSIS — Z794 Long term (current) use of insulin: Secondary | ICD-10-CM | POA: Diagnosis not present

## 2016-10-29 DIAGNOSIS — E0865 Diabetes mellitus due to underlying condition with hyperglycemia: Secondary | ICD-10-CM | POA: Diagnosis not present

## 2016-10-29 DIAGNOSIS — E08618 Diabetes mellitus due to underlying condition with other diabetic arthropathy: Secondary | ICD-10-CM | POA: Diagnosis not present

## 2016-10-30 LAB — COMPREHENSIVE METABOLIC PANEL
ALBUMIN: 4.6 g/dL (ref 3.5–4.8)
ALT: 22 IU/L (ref 0–32)
AST: 27 IU/L (ref 0–40)
Albumin/Globulin Ratio: 1.6 (ref 1.2–2.2)
Alkaline Phosphatase: 91 IU/L (ref 39–117)
BUN/Creatinine Ratio: 15 (ref 12–28)
BUN: 14 mg/dL (ref 8–27)
Bilirubin Total: 0.3 mg/dL (ref 0.0–1.2)
CO2: 20 mmol/L (ref 20–29)
CREATININE: 0.91 mg/dL (ref 0.57–1.00)
Calcium: 9.5 mg/dL (ref 8.7–10.3)
Chloride: 99 mmol/L (ref 96–106)
GFR, EST AFRICAN AMERICAN: 73 mL/min/{1.73_m2} (ref >59)
GFR, EST NON AFRICAN AMERICAN: 64 mL/min/{1.73_m2} (ref >59)
GLOBULIN, TOTAL: 2.9 g/dL (ref 1.5–4.5)
GLUCOSE: 96 mg/dL (ref 65–99)
Potassium: 4.6 mmol/L (ref 3.5–5.2)
SODIUM: 139 mmol/L (ref 134–144)
TOTAL PROTEIN: 7.5 g/dL (ref 6.0–8.5)

## 2016-10-30 LAB — AMBIG ABBREV CMP14 DEFAULT

## 2016-10-30 LAB — HGB A1C W/O EAG: Hgb A1c MFr Bld: 8.2 % — ABNORMAL HIGH (ref 4.8–5.6)

## 2016-11-03 ENCOUNTER — Encounter: Payer: Self-pay | Admitting: Family Medicine

## 2016-11-03 ENCOUNTER — Ambulatory Visit (INDEPENDENT_AMBULATORY_CARE_PROVIDER_SITE_OTHER): Payer: Medicare Other | Admitting: Family Medicine

## 2016-11-03 VITALS — BP 130/82 | HR 95 | Temp 99.2°F | Ht 69.0 in | Wt 188.0 lb

## 2016-11-03 DIAGNOSIS — E119 Type 2 diabetes mellitus without complications: Secondary | ICD-10-CM

## 2016-11-03 DIAGNOSIS — Z0000 Encounter for general adult medical examination without abnormal findings: Secondary | ICD-10-CM

## 2016-11-03 DIAGNOSIS — Z23 Encounter for immunization: Secondary | ICD-10-CM | POA: Diagnosis not present

## 2016-11-03 DIAGNOSIS — Z794 Long term (current) use of insulin: Secondary | ICD-10-CM

## 2016-11-03 DIAGNOSIS — E782 Mixed hyperlipidemia: Secondary | ICD-10-CM

## 2016-11-03 DIAGNOSIS — I10 Essential (primary) hypertension: Secondary | ICD-10-CM

## 2016-11-03 DIAGNOSIS — IMO0001 Reserved for inherently not codable concepts without codable children: Secondary | ICD-10-CM

## 2016-11-03 LAB — POC HEMOCCULT BLD/STL (OFFICE/1-CARD/DIAGNOSTIC): FECAL OCCULT BLD: NEGATIVE

## 2016-11-03 NOTE — Patient Instructions (Signed)
F/u mid December, call if you need me sooner  You need to change eating habit to get blood sugar controlled again as we discussed   Flu vaccine today  Fasting lipid, cmp and eGFr and hBa1C  1 week before December visit at [place] have arthritis why they are swollen  You do qualify for diabetic shoes  Thank you  for choosing  Primary Care. We consider it a privelige to serve you.  Delivering excellent health care in a caring and  compassionate way is our goal.  Partnering with you,  so that together we can achieve this goal is our strategy.

## 2016-11-03 NOTE — Progress Notes (Signed)
Stacey Hanson     MRN: [id number]      DOB: 05-01-1945  HPI: Patient is in for annual physical exam. No other health concerns are expressed or addressed at the visit. Recent labs, are reviewed. Immunization is reviewed , and  updated   PE: BP 130/82 (BP Location: Left Arm, Patient Position: Sitting, Cuff Size: Large)   Pulse 95   Temp 99.2 F (37.3 C)   Ht 5\' 9"  (1.753 m)   Wt 188 lb (85.3 kg)   SpO2 97%   BMI 27.76 kg/m   Pleasant  female, alert and oriented x 3, in no cardio-pulmonary distress. Afebrile. HEENT No facial trauma or asymetry. Sinuses non tender.  Extra occullar muscles intact, pupils equally reactive to light. External ears normal, tympanic membranes clear. Oropharynx moist, no exudate. Neck: supple, no adenopathy,JVD or thyromegaly.No bruits.  Chest: Clear to ascultation bilaterally.No crackles or wheezes. Non tender to palpation  Breast: No asymetry,no masses or lumps. No tenderness. No nipple discharge or inversion. No axillary or supraclavicular adenopathy  Cardiovascular system; Heart sounds normal,  S1 and  S2 ,no S3.  No murmur, or thrill. Apical beat not displaced Peripheral pulses normal.  Abdomen: Soft, non tender, no organomegaly or masses. No bruits. Bowel sounds normal. No guarding, tenderness or rebound.  Rectal:  Normal sphincter tone. No rectal mass. Guaiac negative stool.  GU: Not examined  Musculoskeletal exam: Decreased l ROM of spine, hips , shoulders and knees. No deformity ,swelling or crepitus noted. No muscle wasting or atrophy.   Neurologic: Cranial nerves 2 to 12 intact. Power, tone ,and reflexes normal throughout. No disturbance in gait. No tremor.  Skin: Intact, no ulceration, erythema , scaling or rash noted. Pigmentation normal throughout  Psych; Normal mood and affect. Judgement and concentration normal   Assessment & Plan:  Annual physical exam Annual exam as documented.  Immunization and  cancer screening needs are specifically addressed at this visit.   Diabetes mellitus, insulin dependent (IDDM), controlled ([place]) Deteriorated due to non compliance with diet, no med change Stacey Hanson is reminded of the importance of commitment to daily physical activity for 30 minutes or more, as able and the need to limit carbohydrate intake to 30 to 60 grams per meal to help with blood sugar control.   The need to take medication as prescribed, test blood sugar as directed, and to call between visits if there is a concern that blood sugar is uncontrolled is also discussed.   Stacey Hanson is reminded of the importance of daily foot exam, annual eye examination, and good blood sugar, blood pressure and cholesterol control.  Diabetic Labs Latest Ref Rng & Units 10/29/2016 06/19/2016 03/12/2016 01/11/2016 11/09/2015  HbA1c 4.8 - 5.6 % 8.2(H) 7.0(H) 6.2(H) - 8.3(H)  Microalbumin Not estab mg/dL - - 0.4 - -  Micro/Creat Ratio <30 mcg/mg creat - - 4 - -  Chol 100 - 199 mg/dL - 176 - 162 -  HDL >39 mg/dL - 70 - 73 -  Calc LDL 0 - 99 mg/dL - 91 - 78 -  Triglycerides 0 - 149 mg/dL - 77 - 55 -  Creatinine 0.57 - 1.00 mg/dL 0.91 0.89 0.96(H) 0.94(H) 0.94(H)   BP/Weight 11/03/2016 08/08/2016 06/22/2016 04/08/2016 02/25/2016 02/13/2016 38/75/6433  Systolic BP 295 188 416 606 301 601 093  Diastolic BP 82 78 80 77 62 68 78  Wt. (Lbs) 188 - 211.4 208 204.12 202.8 203  BMI 27.76 - 31.22 30.72 31.04 29.95 30.87  Foot/eye exam completion dates Latest Ref Rng & Units 11/03/2016 10/03/2015  Eye Exam No Retinopathy - -  Foot exam Order - - -  Foot Form Completion - Done Done

## 2016-11-04 ENCOUNTER — Telehealth: Payer: Self-pay | Admitting: Family Medicine

## 2016-11-04 MED ORDER — UNABLE TO FIND: 0 refills | Status: DC

## 2016-11-04 NOTE — Telephone Encounter (Signed)
Patient is requesting an order for diabetic shoes be sent to [employer] [facility], please contact her when this is sent [phone]

## 2016-11-06 NOTE — Assessment & Plan Note (Signed)
Annual exam as documented. . Immunization and cancer screening needs are specifically addressed at this visit.  

## 2016-11-06 NOTE — Assessment & Plan Note (Signed)
Deteriorated due to non compliance with diet, no med change Stacey Hanson is reminded of the importance of commitment to daily physical activity for 30 minutes or more, as able and the need to limit carbohydrate intake to 30 to 60 grams per meal to help with blood sugar control.   The need to take medication as prescribed, test blood sugar as directed, and to call between visits if there is a concern that blood sugar is uncontrolled is also discussed.   Stacey Hanson is reminded of the importance of daily foot exam, annual eye examination, and good blood sugar, blood pressure and cholesterol control.  Diabetic Labs Latest Ref Rng & Units 10/29/2016 06/19/2016 03/12/2016 01/11/2016 11/09/2015  HbA1c 4.8 - 5.6 % 8.2(H) 7.0(H) 6.2(H) - 8.3(H)  Microalbumin Not estab mg/dL - - 0.4 - -  Micro/Creat Ratio <30 mcg/mg creat - - 4 - -  Chol 100 - 199 mg/dL - 176 - 162 -  HDL >39 mg/dL - 70 - 73 -  Calc LDL 0 - 99 mg/dL - 91 - 78 -  Triglycerides 0 - 149 mg/dL - 77 - 55 -  Creatinine 0.57 - 1.00 mg/dL 0.91 0.89 0.96(H) 0.94(H) 0.94(H)   BP/Weight 11/03/2016 08/08/2016 06/22/2016 04/08/2016 02/25/2016 02/13/2016 29/47/6546  Systolic BP 503 546 568 127 517 001 749  Diastolic BP 82 78 80 77 62 68 78  Wt. (Lbs) 188 - 211.4 208 204.12 202.8 203  BMI 27.76 - 31.22 30.72 31.04 29.95 30.87   Foot/eye exam completion dates Latest Ref Rng & Units 11/03/2016 10/03/2015  Eye Exam No Retinopathy - -  Foot exam Order - - -  Foot Form Completion - Done Done

## 2016-11-06 NOTE — Telephone Encounter (Signed)
Please inform when 11/03/16 office notes are complete, so I can send with rx.

## 2016-11-06 NOTE — Telephone Encounter (Signed)
signed

## 2016-11-06 NOTE — Telephone Encounter (Signed)
Faxed

## 2016-11-09 ENCOUNTER — Other Ambulatory Visit: Payer: Self-pay

## 2016-11-19 ENCOUNTER — Telehealth: Payer: Self-pay | Admitting: Family Medicine

## 2016-11-19 ENCOUNTER — Ambulatory Visit (INDEPENDENT_AMBULATORY_CARE_PROVIDER_SITE_OTHER): Payer: Medicare Other | Admitting: Otolaryngology

## 2016-11-19 DIAGNOSIS — D440 Neoplasm of uncertain behavior of thyroid gland: Secondary | ICD-10-CM

## 2016-11-19 NOTE — Telephone Encounter (Signed)
Patient would like to know the status of her diabetic shoes.  She has checked with [facility] and they don't have anything.  Please advise .

## 2016-11-20 NOTE — Telephone Encounter (Signed)
Waiting on foot exam notes. Office notes were sent, but the need the specific foot exam document.

## 2016-11-24 NOTE — Telephone Encounter (Signed)
Please provide foot exam notes-not office notes

## 2016-11-29 ENCOUNTER — Other Ambulatory Visit: Payer: Self-pay | Admitting: Family Medicine

## 2016-11-30 ENCOUNTER — Telehealth: Payer: Self-pay | Admitting: Family Medicine

## 2016-11-30 NOTE — Telephone Encounter (Signed)
Sent!

## 2016-11-30 NOTE — Telephone Encounter (Signed)
Error

## 2016-11-30 NOTE — Telephone Encounter (Signed)
Pls print foot exam as we have just doscussed, any further problems pLEASE let me know

## 2017-01-04 ENCOUNTER — Other Ambulatory Visit: Payer: Self-pay | Admitting: Family Medicine

## 2017-01-04 ENCOUNTER — Telehealth: Payer: Self-pay | Admitting: Family Medicine

## 2017-01-04 NOTE — Telephone Encounter (Signed)
Doris from [facility] left a message requesting a call back to discuss patients diabetic shoe exam paperwork. [phone]

## 2017-01-04 NOTE — Telephone Encounter (Signed)
Info sent

## 2017-01-06 ENCOUNTER — Ambulatory Visit (INDEPENDENT_AMBULATORY_CARE_PROVIDER_SITE_OTHER): Payer: Medicare Other

## 2017-01-06 ENCOUNTER — Ambulatory Visit: Payer: Medicare Other | Admitting: Orthopedic Surgery

## 2017-01-06 VITALS — BP 110/69 | HR 110 | Ht 69.0 in | Wt 212.0 lb

## 2017-01-06 DIAGNOSIS — M1710 Unilateral primary osteoarthritis, unspecified knee: Secondary | ICD-10-CM

## 2017-01-06 DIAGNOSIS — M1711 Unilateral primary osteoarthritis, right knee: Secondary | ICD-10-CM | POA: Diagnosis not present

## 2017-01-06 DIAGNOSIS — M25562 Pain in left knee: Secondary | ICD-10-CM

## 2017-01-06 NOTE — Progress Notes (Signed)
Chief Complaint  Patient presents with  . Follow-up    Recheck on right knee arthritis    History this is a 71 year old female presents for annual follow-up referencing her right knee arthritis which currently is a little worse today than usual she thinks it may be because the weather is changing.  She has a moderate dull aching constant pain on the lateral aspect of the right knee with decreased range of motion  She also wants me to note that she is having increasing pain and swelling in her left knee which is arthritic in nature with dull activity related mild discomfort which is intermittent and she notices decreased flexion.  Review of systems she reports no malaise fatigue skin rash or sensory changes in the lower extremities  Past Medical History:  Diagnosis Date  . BMI 30.0-30.9,adult 2012 215 LBS   2004 198 LBS  . Diabetes mellitus   . 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 H. pylori infection 09/24/2010  . Hyperlipidemia   . Hypertension   . IDDM (insulin dependent diabetes mellitus) ([place]) 1987  . Knee pain   . Obesity      Current Outpatient Medications:  .  ASPIRIN LOW DOSE 81 MG EC tablet, TAKE 1 TABLET(81 MG) BY MOUTH DAILY, Disp: 100 tablet, Rfl: 3 .  B-D UF III MINI PEN NEEDLES 31G X 5 MM MISC, USE FOR TWICE DAILY INJECTIONS OF HUMALOG, Disp: 100 each, Rfl: 10 .  benazepril (LOTENSIN) 40 MG tablet, TAKE 1 TABLET(40 MG) BY MOUTH DAILY, Disp: 90 tablet, Rfl: 1 .  Blood Glucose Monitoring Suppl ([place]) KIT, by Does not apply route 2 (two) times daily. Two times a day testing , Disp: , Rfl:  .  Calcium Carbonate-Vitamin D (CALCIUM PLUS VITAMIN D PO), Take by mouth 2 (two) times daily. Take two tablets daily , Disp: , Rfl:  .  glucose blood (ONE TOUCH ULTRA TEST) test strip, Use as instructed twice daily dx 250.01, Disp: 100 each, Rfl: 5 .  glucose blood (TRUETRACK TEST) test strip, 1 each by Other route 2 (two) times daily. Two times a  day , Disp: , Rfl:  .  HUMALOG MIX 75/25 KWIKPEN (75-25) 100 UNIT/ML Kwikpen, INJECT 40 UNITS INTO THE SKIN TWICE DAILY, Disp: 30 mL, Rfl: 0 .  hydrochlorothiazide (HYDRODIURIL) 25 MG tablet, TAKE 1 TABLET(25 MG) BY MOUTH DAILY, Disp: 90 tablet, Rfl: 3 .  levothyroxine (SYNTHROID, LEVOTHROID) 88 MCG tablet, TAKE 1 TABLET BY MOUTH EVERY DAY IN THE MORNING, Disp: 30 tablet, Rfl: 5 .  levothyroxine (SYNTHROID, LEVOTHROID) 88 MCG tablet, TAKE 1 TABLET BY MOUTH EVERY MORNING, Disp: 30 tablet, Rfl: 2 .  metFORMIN (GLUCOPHAGE) 1000 MG tablet, TAKE 1 TABLET(1000 MG) BY MOUTH TWICE DAILY WITH A MEAL, Disp: 180 tablet, Rfl: 0 .  montelukast (SINGULAIR) 10 MG tablet, Take 10 mg by mouth at bedtime., Disp: , Rfl:  .  Multiple Vitamin (MULTIVITAMIN) capsule, Take 1 capsule by mouth daily.  , Disp: , Rfl:  .  olopatadine (PATANOL) 0.1 % ophthalmic solution, Place 1 drop into both eyes 2 (two) times daily., Disp: 5 mL, Rfl: 3 .  omeprazole (PRILOSEC) 20 MG capsule, TAKE 1 CAPSULE BY MOUTH EVERY MORNING, Disp: 30 capsule, Rfl: 11 .  ONETOUCH DELICA LANCETS 33O MISC, Use as directed twice daily dx 250.01, Disp: 100 each, Rfl: 5 .  pravastatin (PRAVACHOL) 40 MG tablet, TAKE 1 TABLET(40 MG) BY MOUTH DAILY, Disp: 90 tablet,  Rfl: 1 .  UNABLE TO FIND, Diabetic shoes, Disp: 1 each, Rfl: 0  BP 110/69   Pulse (!) 110   Ht _0  (1.753 m)   Wt 212 lb (96.2 kg)   BMI 31.31 kg/m   She has maintained a reasonable weight she is well-developed well-nourished grooming and hygiene are normal She is oriented x3 Her mood and affect are pleasant  She has a valgus alignment to her right knee and she is walking with a noticeable limp  The right knee is tender over the lateral compartment there is no effusion her flexion arc is 90 degrees she has a 5 degree flexion contracture All ligaments were examined and were stable her motor function was normal and the quadriceps with no Demonstrable weakness on manual muscle  testing  The skin was warm dry and intact and the neurovascular exam was normal  On the left side we find a swollen knee with a mild effusion flexion 90 degrees no extension lag or loss stable ligaments motor exam normal skin intact pulses and perfusion normal and normal sensation  Encounter Diagnoses  Name Primary?  Marland Kitchen Arthritis of knee Yes  . Primary osteoarthritis of right knee   . Acute pain of left knee     X-ray of the right knee shows 13 degree valgus severe arthritis with lateral compartment  Primary arthritis right knee: She is not wanting to have any surgery at this time so we will see her again in a year  New acute pain left knee: Appears to be arthritic as well patient will continue with Tylenol for pain  (Medical decision making order and interpret imaging, establish problem worse right knee new problem left knee, x-ray)

## 2017-01-12 ENCOUNTER — Encounter: Payer: Self-pay | Admitting: Gastroenterology

## 2017-01-19 DIAGNOSIS — M2142 Flat foot [pes planus] (acquired), left foot: Secondary | ICD-10-CM | POA: Diagnosis not present

## 2017-01-19 DIAGNOSIS — E119 Type 2 diabetes mellitus without complications: Secondary | ICD-10-CM | POA: Diagnosis not present

## 2017-01-19 DIAGNOSIS — M2141 Flat foot [pes planus] (acquired), right foot: Secondary | ICD-10-CM | POA: Diagnosis not present

## 2017-02-01 ENCOUNTER — Ambulatory Visit: Payer: Medicare Other

## 2017-02-02 ENCOUNTER — Ambulatory Visit: Payer: Medicare Other | Admitting: Gastroenterology

## 2017-02-02 ENCOUNTER — Encounter: Payer: Self-pay | Admitting: Gastroenterology

## 2017-02-02 VITALS — BP 146/78 | HR 101 | Temp 97.4°F | Ht 69.0 in | Wt 215.6 lb

## 2017-02-02 DIAGNOSIS — K219 Gastro-esophageal reflux disease without esophagitis: Secondary | ICD-10-CM

## 2017-02-02 NOTE — Progress Notes (Signed)
CC'ED TO PCP 

## 2017-02-02 NOTE — Assessment & Plan Note (Signed)
Doing very well on omeprazole.  Continue current regimen.  Reinforced antireflux measures.  She will come back in May for follow-up.  At that time she will be due for colonoscopy as well.

## 2017-02-02 NOTE — Progress Notes (Signed)
Primary Care Physician: Fayrene Helper, MD  Primary Gastroenterologist:  Barney Drain, MD   Chief Complaint  Patient presents with  . Gastroesophageal Reflux    f/u, doing ok    HPI: Stacey Hanson is a 71 y.o. female here for yearly follow-up for GERD.  Heartburn well controlled.  No dysphagia.  No vomiting.  Bowel movements are regular.  No blood in the stool or melena.  Occasional upper abdominal pain, migrates, seems to be brought on by coughing or sneezing, short-lived.  No weight loss.  Heme negative back in 10/2016.    Current Outpatient Medications  Medication Sig Dispense Refill  . ASPIRIN LOW DOSE 81 MG EC tablet TAKE 1 TABLET(81 MG) BY MOUTH DAILY 100 tablet 3  . B-D UF III MINI PEN NEEDLES 31G X 5 MM MISC USE FOR TWICE DAILY INJECTIONS OF HUMALOG 100 each 10  . benazepril (LOTENSIN) 40 MG tablet TAKE 1 TABLET(40 MG) BY MOUTH DAILY 90 tablet 1  . Blood Glucose Monitoring Suppl ([place]) KIT by Does not apply route 2 (two) times daily. Two times a day testing     . Calcium Carbonate-Vitamin D (CALCIUM PLUS VITAMIN D PO) Take by mouth 2 (two) times daily. Take two tablets daily     . glucose blood (ONE TOUCH ULTRA TEST) test strip Use as instructed twice daily dx 250.01 100 each 5  . glucose blood (TRUETRACK TEST) test strip 1 each by Other route 2 (two) times daily. Two times a day     . HUMALOG MIX 75/25 KWIKPEN (75-25) 100 UNIT/ML Kwikpen INJECT 40 UNITS INTO THE SKIN TWICE DAILY 30 mL 0  . hydrochlorothiazide (HYDRODIURIL) 25 MG tablet TAKE 1 TABLET(25 MG) BY MOUTH DAILY 90 tablet 3  . levothyroxine (SYNTHROID, LEVOTHROID) 88 MCG tablet TAKE 1 TABLET BY MOUTH EVERY DAY IN THE MORNING 30 tablet 5  . levothyroxine (SYNTHROID, LEVOTHROID) 88 MCG tablet TAKE 1 TABLET BY MOUTH EVERY MORNING 30 tablet 2  . loratadine (CLARITIN) 10 MG tablet Take 10 mg by mouth daily as needed for allergies.    . metFORMIN (GLUCOPHAGE) 1000 MG tablet TAKE 1 TABLET(1000  MG) BY MOUTH TWICE DAILY WITH A MEAL 180 tablet 0  . Multiple Vitamin (MULTIVITAMIN) capsule Take 1 capsule by mouth daily.      Marland Kitchen olopatadine (PATANOL) 0.1 % ophthalmic solution Place 1 drop into both eyes 2 (two) times daily. (Patient taking differently: Place 1 drop into both eyes as needed. ) 5 mL 3  . omeprazole (PRILOSEC) 20 MG capsule TAKE 1 CAPSULE BY MOUTH EVERY MORNING 30 capsule 11  . ONETOUCH DELICA LANCETS 41L MISC Use as directed twice daily dx 250.01 100 each 5  . pravastatin (PRAVACHOL) 40 MG tablet TAKE 1 TABLET(40 MG) BY MOUTH DAILY 90 tablet 1  . UNABLE TO FIND Diabetic shoes 1 each 0   No current facility-administered medications for this visit.     Allergies as of 02/02/2017 - Review Complete 02/02/2017  Allergen Reaction Noted  . Simvastatin  05/01/2010    ROS:  General: Negative for anorexia, weight loss, fever, chills, fatigue, weakness. ENT: Negative for hoarseness, difficulty swallowing , nasal congestion. CV: Negative for chest pain, angina, palpitations, dyspnea on exertion, peripheral edema.  Respiratory: Negative for dyspnea at rest, dyspnea on exertion, cough, sputum, wheezing.  GI: See history of present illness. GU:  Negative for dysuria, hematuria, urinary incontinence, urinary frequency, nocturnal urination.  Endo: Negative for unusual weight change.  Physical Examination:   BP (!) 146/78   Pulse (!) 101   Temp (!) 97.4 F (36.3 C) (Oral)   Ht '5\' 9"'  (1.753 m)   Wt 215 lb 9.6 oz (97.8 kg)   BMI 31.84 kg/m   General: Well-nourished, well-developed in no acute distress.  Eyes: No icterus. Mouth: Oropharyngeal mucosa moist and pink , no lesions erythema or exudate. Lungs: Clear to auscultation bilaterally.  Heart: Regular rate and rhythm, no murmurs rubs or gallops.  Abdomen: Bowel sounds are normal, nontender, nondistended, no hepatosplenomegaly or masses, no abdominal bruits or hernia , no rebound or guarding.   Extremities: No lower  extremity edema. No clubbing or deformities. Neuro: Alert and oriented x 4   Skin: Warm and dry, no jaundice.   Psych: Alert and cooperative, normal mood and affect.  Labs:  Lab Results  Component Value Date   HGBA1C 8.2 (H) 10/29/2016   Lab Results  Component Value Date   CREATININE 0.91 10/29/2016   BUN 14 10/29/2016   NA 139 10/29/2016   K 4.6 10/29/2016   CL 99 10/29/2016   CO2 20 10/29/2016   Lab Results  Component Value Date   ALT 22 10/29/2016   AST 27 10/29/2016   ALKPHOS 91 10/29/2016   BILITOT 0.3 10/29/2016     Imaging Studies: Dg Knee Ap/lat W/sunrise Right  Result Date: 01/06/2017 [facility] report dictated by Dr. Aline Brochure 3 views right knee Chief complaint is painful right knee with arthritis annual follow-up x-ray Overall alignment is valgus 13 degrees Lateral compartment severe joint space narrowing osteophyte formation cyst formation medial compartment squaring of the condyle mild joint space narrowing On the sunrise view femoral condyles have extensive osteophyte formation patellofemoral joint shows marginal osteophytes with normal joint spaces medially and laterally and centered patella Impression severe arthritis with valgus deformity right knee

## 2017-02-02 NOTE — Patient Instructions (Signed)
1. Continue omeprazole daily for heartburn.  2. Return to the office in 06/2017 for follow up and to schedule colonoscopy at that time.

## 2017-02-05 ENCOUNTER — Other Ambulatory Visit: Payer: Self-pay | Admitting: Family Medicine

## 2017-02-08 ENCOUNTER — Other Ambulatory Visit: Payer: Self-pay | Admitting: Family Medicine

## 2017-02-11 DIAGNOSIS — E119 Type 2 diabetes mellitus without complications: Secondary | ICD-10-CM | POA: Diagnosis not present

## 2017-02-11 DIAGNOSIS — Z794 Long term (current) use of insulin: Secondary | ICD-10-CM | POA: Diagnosis not present

## 2017-02-11 DIAGNOSIS — E782 Mixed hyperlipidemia: Secondary | ICD-10-CM | POA: Diagnosis not present

## 2017-02-12 LAB — LIPID PANEL
CHOL/HDL RATIO: 2.7 ratio (ref 0.0–4.4)
Cholesterol, Total: 171 mg/dL (ref 100–199)
HDL: 64 mg/dL (ref >39)
LDL Calculated: 91 mg/dL (ref 0–99)
Triglycerides: 78 mg/dL (ref 0–149)
VLDL CHOLESTEROL CAL: 16 mg/dL (ref 5–40)

## 2017-02-12 LAB — COMPREHENSIVE METABOLIC PANEL
ALBUMIN: 4.6 g/dL (ref 3.5–4.8)
ALT: 20 IU/L (ref 0–32)
AST: 27 IU/L (ref 0–40)
Albumin/Globulin Ratio: 1.5 (ref 1.2–2.2)
Alkaline Phosphatase: 89 IU/L (ref 39–117)
BUN / CREAT RATIO: 19 (ref 12–28)
BUN: 19 mg/dL (ref 8–27)
Bilirubin Total: 0.3 mg/dL (ref 0.0–1.2)
CALCIUM: 9.7 mg/dL (ref 8.7–10.3)
CO2: 24 mmol/L (ref 20–29)
Chloride: 95 mmol/L — ABNORMAL LOW (ref 96–106)
Creatinine, Ser: 0.98 mg/dL (ref 0.57–1.00)
GFR, EST AFRICAN AMERICAN: 67 mL/min/{1.73_m2} (ref >59)
GFR, EST NON AFRICAN AMERICAN: 58 mL/min/{1.73_m2} — AB (ref >59)
GLUCOSE: 207 mg/dL — AB (ref 65–99)
Globulin, Total: 3 g/dL (ref 1.5–4.5)
Potassium: 4.5 mmol/L (ref 3.5–5.2)
Sodium: 137 mmol/L (ref 134–144)
TOTAL PROTEIN: 7.6 g/dL (ref 6.0–8.5)

## 2017-02-12 LAB — HEMOGLOBIN A1C
ESTIMATED AVERAGE GLUCOSE: 186 mg/dL
HEMOGLOBIN A1C: 8.1 % — AB (ref 4.8–5.6)

## 2017-02-15 ENCOUNTER — Encounter: Payer: Self-pay | Admitting: Family Medicine

## 2017-02-15 ENCOUNTER — Ambulatory Visit (INDEPENDENT_AMBULATORY_CARE_PROVIDER_SITE_OTHER): Payer: Medicare Other | Admitting: Family Medicine

## 2017-02-15 VITALS — BP 132/72 | HR 84 | Resp 16 | Ht 69.0 in | Wt 216.0 lb

## 2017-02-15 DIAGNOSIS — E119 Type 2 diabetes mellitus without complications: Secondary | ICD-10-CM

## 2017-02-15 DIAGNOSIS — I10 Essential (primary) hypertension: Secondary | ICD-10-CM | POA: Diagnosis not present

## 2017-02-15 DIAGNOSIS — Z794 Long term (current) use of insulin: Secondary | ICD-10-CM

## 2017-02-15 DIAGNOSIS — E782 Mixed hyperlipidemia: Secondary | ICD-10-CM | POA: Diagnosis not present

## 2017-02-15 DIAGNOSIS — IMO0001 Reserved for inherently not codable concepts without codable children: Secondary | ICD-10-CM

## 2017-02-15 NOTE — Patient Instructions (Addendum)
F/U in 3.5 months, call if you need me sooner  pPEASE change  your snacks,cut out the cakes , pies, ice cream , cookies, candy, fruit is healthy  You are referred to diabetic educator , she will call with appt info  Continue  To exercise every day for 30 minutes at least  Non fast HBA1C and chem 7 and EGFR  Blood pressure and cholesterol are both very good  Use tylenol arthritis 1 to 2 at bedtime for burning pain in back going down legs  Thank you  for choosing [facility]. We consider it a privelige to serve you.  Delivering excellent health care in a caring and  compassionate way is our goal.  Partnering with you,  so that together we can achieve this goal is our strategy.

## 2017-02-20 NOTE — Assessment & Plan Note (Signed)
Controlled, no change in medication , however no med change DASH diet and commitment to daily physical activity for a minimum of 30 minutes discussed and encouraged, as a part of hypertension management. The importance of attaining a healthy weight is also discussed.  BP/Weight 02/15/2017 02/02/2017 01/06/2017 11/03/2016 08/08/2016 5/32/0233 06/02/5684  Systolic BP 168 372 902 111 552 080 223  Diastolic BP 72 78 69 82 78 80 77  Wt. (Lbs) 216 215.6 212 188 - 211.4 208  BMI 31.9 31.84 31.31 27.76 - 31.22 30.72

## 2017-02-20 NOTE — Assessment & Plan Note (Signed)
Deteriorated. Patient re-educated about  the importance of commitment to a  minimum of 150 minutes of exercise per week.  The importance of healthy food choices with portion control discussed. Encouraged to start a food diary, count calories and to consider  joining a support group. Sample diet sheets offered. Goals set by the patient for the next several months.   Weight /BMI 02/15/2017 02/02/2017 01/06/2017  WEIGHT 216 lb 215 lb 9.6 oz 212 lb  HEIGHT 5\' 9"  5\' 9"  5\' 9"   BMI 31.9 kg/m2 31.84 kg/m2 31.31 kg/m2

## 2017-02-20 NOTE — Assessment & Plan Note (Signed)
Hyperlipidemia:Low fat diet discussed and encouraged.   Lipid Panel  Lab Results  Component Value Date   CHOL 171 02/11/2017   HDL 64 02/11/2017   LDLCALC 91 02/11/2017   TRIG 78 02/11/2017   CHOLHDL 2.7 02/11/2017

## 2017-02-20 NOTE — Assessment & Plan Note (Signed)
Increased , recommend extra strength tylenol twice daily,

## 2017-02-20 NOTE — Progress Notes (Signed)
Stacey Hanson     MRN: [id number]      DOB: Jan 28, 1946   HPI [name] is here for follow up and re-evaluation of chronic medical conditions, medication management and review of any available recent lab and radiology data.  Preventive health is updated, specifically  Cancer screening and Immunization.   Questions or concerns regarding consultations or procedures which the PT has had in the interim are  addressed. The PT denies any adverse reactions to current medications since the last visit.  Denies polyuria, polydipsia, blurred vision , or hypoglycemic episodes. C/o back pain down legs , no interest in injections at this time, do not last, no recent trigger   ROS Denies recent fever or chills. Denies sinus pressure, nasal congestion, ear pain or sore throat. Denies chest congestion, productive cough or wheezing. Denies chest pains, palpitations and leg swelling Denies abdominal pain, nausea, vomiting,diarrhea or constipation.   Denies dysuria, frequency, hesitancy or incontinence.  Denies headaches, seizures, numbness, or tingling. Denies depression, anxiety or insomnia. Denies skin break down or rash.   PE  BP 132/72   Pulse 84   Resp 16   Ht 5\' 9"  (1.753 m)   Wt 216 lb (98 kg)   SpO2 96%   BMI 31.90 kg/m   Patient alert and oriented and in no cardiopulmonary distress.  HEENT: No facial asymmetry, EOMI,   oropharynx pink and moist.  Neck supple no JVD, no mass.  Chest: Clear to auscultation bilaterally.  CVS: S1, S2 no murmurs, no S3.Regular rate.  ABD: Soft non tender.   Ext: No edema  MS: Decreased  ROM lumbar  spine, adequate in shoulders, hips and knees.  Skin: Intact, no ulcerations or rash noted.  Psych: Good eye contact, normal affect. Memory intact not anxious or depressed appearing.  CNS: CN 2-12 intact, power,  normal throughout.no focal deficits noted.   Assessment & Plan  Back pain Increased , recommend extra strength tylenol twice daily,    ESSENTIAL HYPERTENSION, BENIGN Controlled, no change in medication , however no med change DASH diet and commitment to daily physical activity for a minimum of 30 minutes discussed and encouraged, as a part of hypertension management. The importance of attaining a healthy weight is also discussed.  BP/Weight 02/15/2017 02/02/2017 01/06/2017 11/03/2016 08/08/2016 10/13/4816 07/06/3147  Systolic BP 702 637 858 850 277 412 878  Diastolic BP 72 78 69 82 78 80 77  Wt. (Lbs) 216 215.6 212 188 - 211.4 208  BMI 31.9 31.84 31.31 27.76 - 31.22 30.72       Diabetes mellitus, insulin dependent (IDDM), controlled (HCC) Improved slightly, no med change , refer to diabetic educator [name] is reminded of the importance of commitment to daily physical activity for 30 minutes or more, as able and the need to limit carbohydrate intake to 30 to 60 grams per meal to help with blood sugar control.   The need to take medication as prescribed, test blood sugar as directed, and to call between visits if there is a concern that blood sugar is uncontrolled is also discussed.   [name] is reminded of the importance of daily foot exam, annual eye examination, and good blood sugar, blood pressure and cholesterol control.  Diabetic Labs Latest Ref Rng & Units 02/11/2017 10/29/2016 06/19/2016 03/12/2016 01/11/2016  HbA1c 4.8 - 5.6 % 8.1(H) 8.2(H) 7.0(H) 6.2(H) -  Microalbumin Not estab mg/dL - - - 0.4 -  Micro/Creat Ratio <30 mcg/mg creat - - - 4 -  Chol 100 - 199 mg/dL 171 - 176 - 162  HDL >39 mg/dL 64 - 70 - 73  Calc LDL 0 - 99 mg/dL 91 - 91 - 78  Triglycerides 0 - 149 mg/dL 78 - 77 - 55  Creatinine 0.57 - 1.00 mg/dL 0.98 0.91 0.89 0.96(H) 0.94(H)   BP/Weight 02/15/2017 02/02/2017 01/06/2017 11/03/2016 08/08/2016 03/16/7260 0/05/5595  Systolic BP 416 384 536 468 032 122 482  Diastolic BP 72 78 69 82 78 80 77  Wt. (Lbs) 216 215.6 212 188 - 211.4 208  BMI 31.9 31.84 31.31 27.76 - 31.22 30.72   Foot/eye exam  completion dates Latest Ref Rng & Units 11/03/2016 10/03/2015  Eye Exam No Retinopathy - -  Foot exam Order - - -  Foot Form Completion - Done Done        Hyperlipidemia Hyperlipidemia:Low fat diet discussed and encouraged.   Lipid Panel  Lab Results  Component Value Date   CHOL 171 02/11/2017   HDL 64 02/11/2017   LDLCALC 91 02/11/2017   TRIG 78 02/11/2017   CHOLHDL 2.7 02/11/2017       Obesity Deteriorated. Patient re-educated about  the importance of commitment to a  minimum of 150 minutes of exercise per week.  The importance of healthy food choices with portion control discussed. Encouraged to start a food diary, count calories and to consider  joining a support group. Sample diet sheets offered. Goals set by the patient for the next several months.   Weight /BMI 02/15/2017 02/02/2017 01/06/2017  WEIGHT 216 lb 215 lb 9.6 oz 212 lb  HEIGHT 5\' 9"  5\' 9"  5\' 9"   BMI 31.9 kg/m2 31.84 kg/m2 31.31 kg/m2

## 2017-02-20 NOTE — Assessment & Plan Note (Signed)
Improved slightly, no med change , refer to diabetic educator Ms. Reinertsen is reminded of the importance of commitment to daily physical activity for 30 minutes or more, as able and the need to limit carbohydrate intake to 30 to 60 grams per meal to help with blood sugar control.   The need to take medication as prescribed, test blood sugar as directed, and to call between visits if there is a concern that blood sugar is uncontrolled is also discussed.   Ms. Sobczak is reminded of the importance of daily foot exam, annual eye examination, and good blood sugar, blood pressure and cholesterol control.  Diabetic Labs Latest Ref Rng & Units 02/11/2017 10/29/2016 06/19/2016 03/12/2016 01/11/2016  HbA1c 4.8 - 5.6 % 8.1(H) 8.2(H) 7.0(H) 6.2(H) -  Microalbumin Not estab mg/dL - - - 0.4 -  Micro/Creat Ratio <30 mcg/mg creat - - - 4 -  Chol 100 - 199 mg/dL 171 - 176 - 162  HDL >39 mg/dL 64 - 70 - 73  Calc LDL 0 - 99 mg/dL 91 - 91 - 78  Triglycerides 0 - 149 mg/dL 78 - 77 - 55  Creatinine 0.57 - 1.00 mg/dL 0.98 0.91 0.89 0.96(H) 0.94(H)   BP/Weight 02/15/2017 02/02/2017 01/06/2017 11/03/2016 08/08/2016 05/24/4980 08/03/1581  Systolic BP 094 076 808 811 031 594 585  Diastolic BP 72 78 69 82 78 80 77  Wt. (Lbs) 216 215.6 212 188 - 211.4 208  BMI 31.9 31.84 31.31 27.76 - 31.22 30.72   Foot/eye exam completion dates Latest Ref Rng & Units 11/03/2016 10/03/2015  Eye Exam No Retinopathy - -  Foot exam Order - - -  Foot Form Completion - Done Done

## 2017-02-23 IMAGING — US US SOFT TISSUE HEAD/NECK
1 series · 13 of 25 positions shown · non-contrast
Comparison: Prior thyroid ultrasound 11/15/2013

CLINICAL DATA: 69-year-old female with thyroid nodules. Per report,
she has had prior biopsy.

EXAM:
THYROID ULTRASOUND
TECHNIQUE: Ultrasound examination of the thyroid gland and adjacent soft
tissues was performed.

[Series 1: us soft tissue head/neck · 0.04mm/px · 13 of 64 slices shown]
[im 1/64]
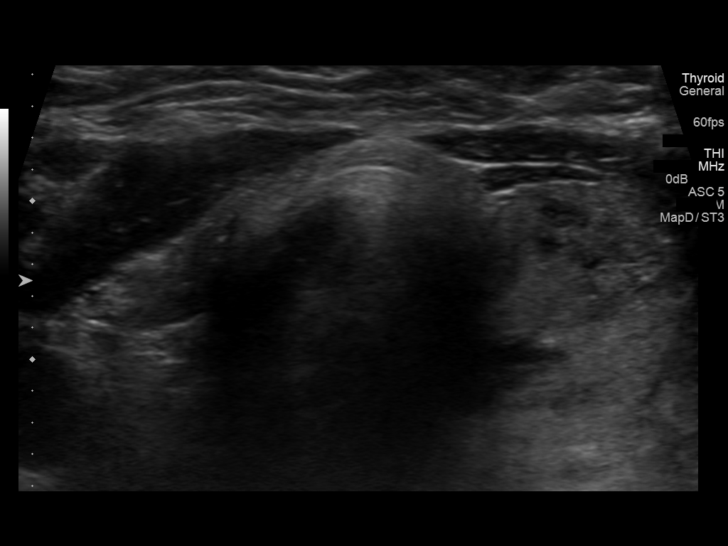
[im 6/64]
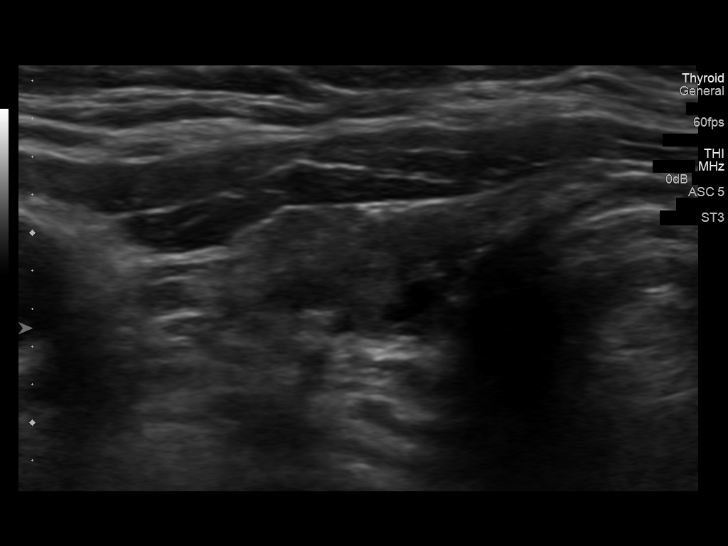
[im 11/64]
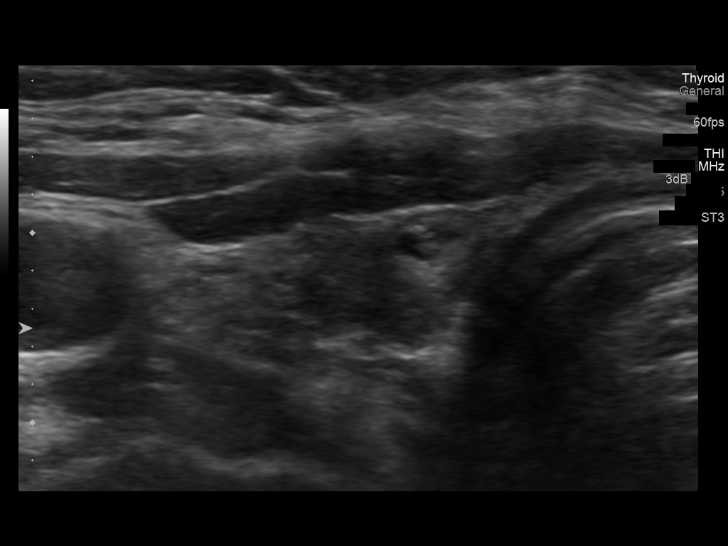
[im 16/64]
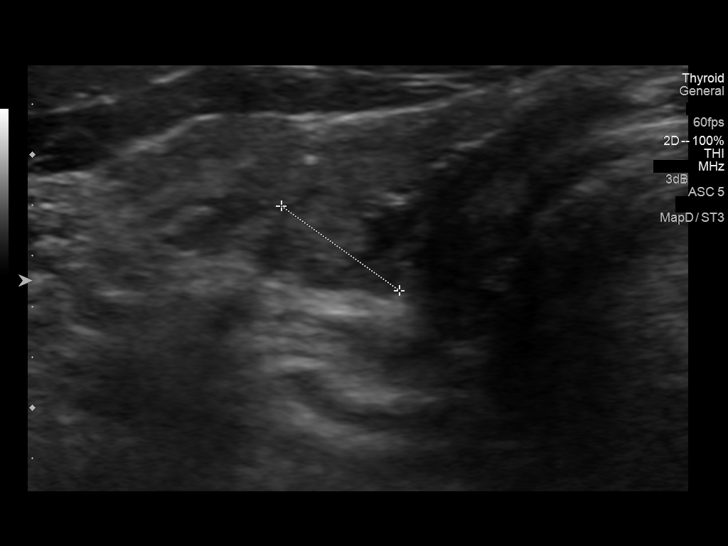
[im 22/64]
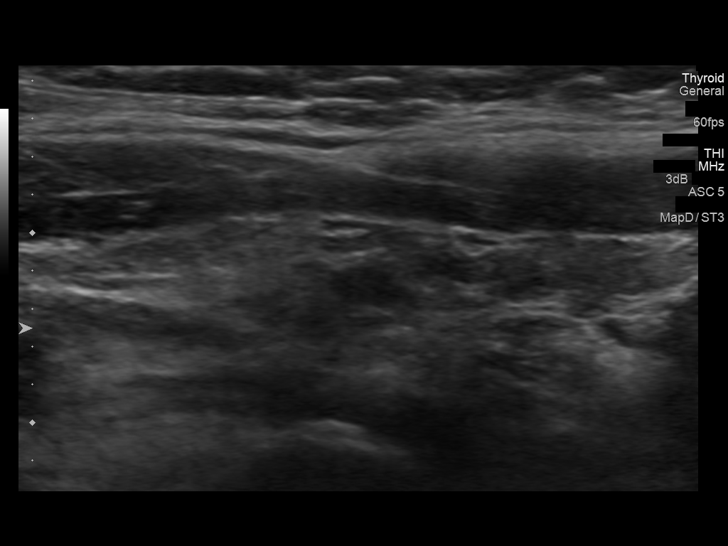
[im 27/64]
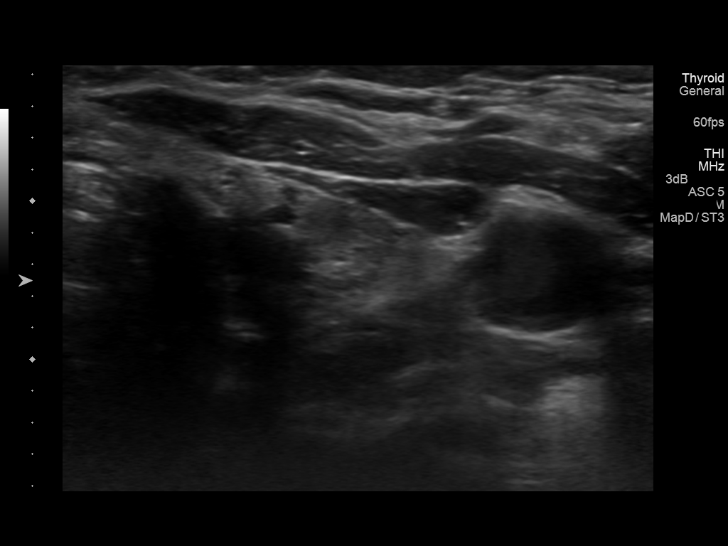
[im 32/64]
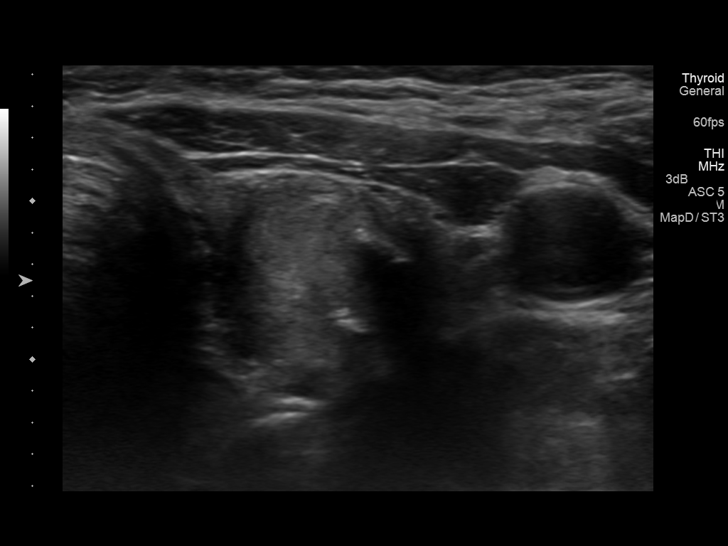
[im 37/64]
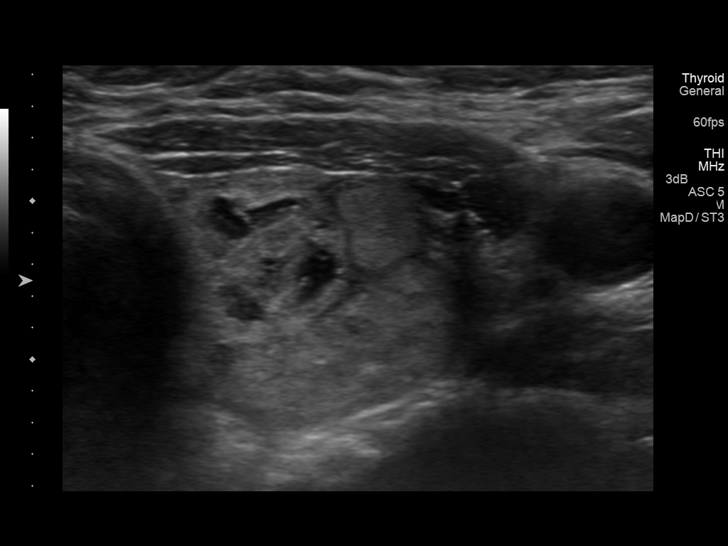
[im 43/64]
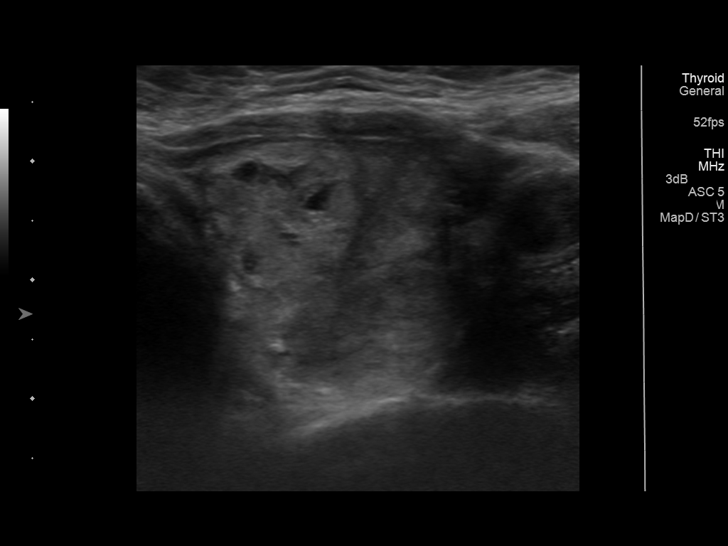
[im 48/64]
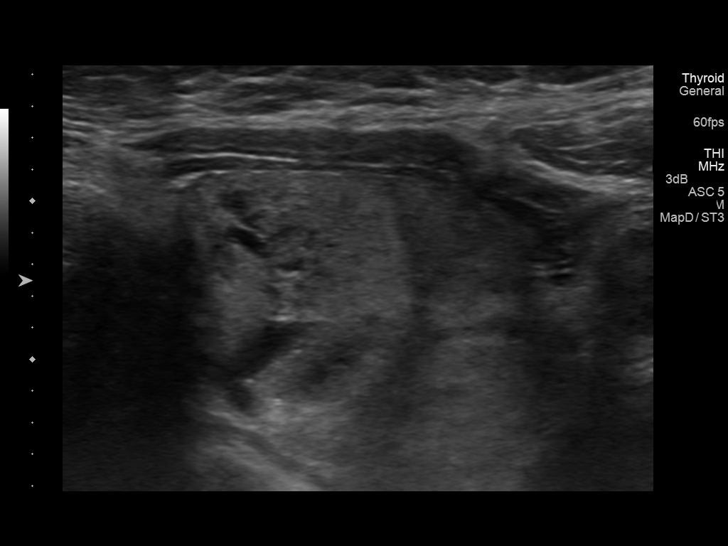
[im 53/64]
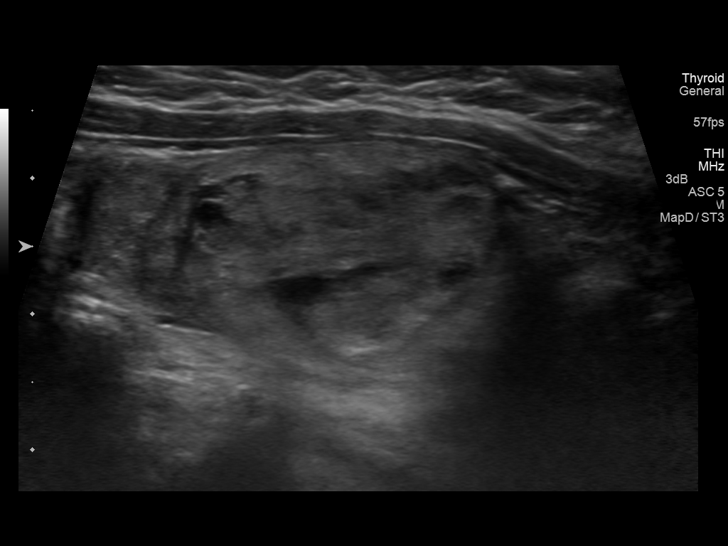
[im 58/64]
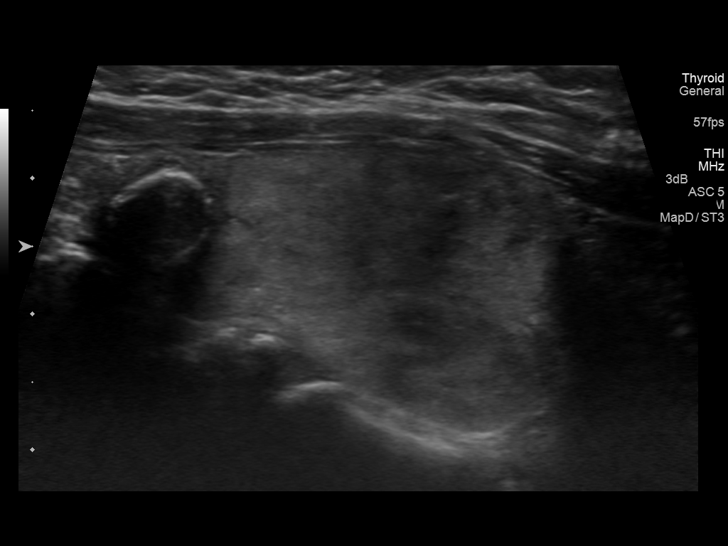
[im 64/64]
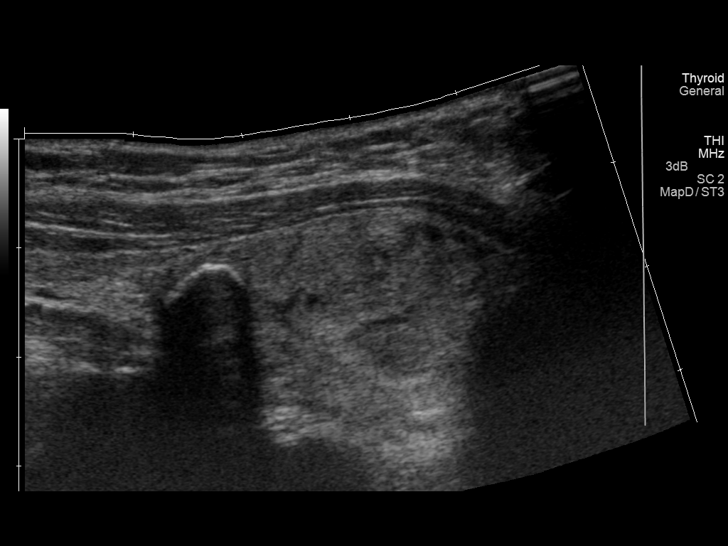

[13 of 25 positions shown; findings below may reference images not displayed]

FINDINGS: Right thyroid lobe

Measurements: 3.1 x 0.6 x 1.3 cm. Diffusely heterogeneous thyroid
gland.

1. Focal lobulation measured at 6 x 6 x 4 mm which is slightly
smaller than previously measured.
Left thyroid lobe

Measurements: 5.0 x 2.2 x 2.4 cm. Diffusely heterogeneous thyroid
gland.

1. Peripherally calcified centrally hypoechoic nodule in the medial
aspect of the mid to upper gland measures 8 x 8 x 8 mm which is
insignificantly changed compared to 7 x 7 x 7 mm previously.
2. Dominant heterogeneous but predominantly cystic mass in the
than the prior measurement of 16 x 24 x 23 mm.
3. Ovoid nodule in the lateral aspect of the mid to lower gland
measures 23 x 8 x 9 mm. This appears to represent 2 smaller nodules
abutting and was not discretely measured on the prior study.

Isthmus

Thickness: 0.9 cm.  No nodules visualized.

Lymphadenopathy

None visualized.
IMPRESSION: 1. Diffusely heterogeneous and multinodular thyroid gland
demonstrates no significant interval change compared to 11/15/2013.

## 2017-02-25 ENCOUNTER — Ambulatory Visit (INDEPENDENT_AMBULATORY_CARE_PROVIDER_SITE_OTHER): Payer: Medicare Other

## 2017-02-25 VITALS — BP 122/78 | HR 110 | Resp 16 | Ht 69.0 in | Wt 219.0 lb

## 2017-02-25 DIAGNOSIS — Z780 Asymptomatic menopausal state: Secondary | ICD-10-CM

## 2017-02-25 DIAGNOSIS — Z0000 Encounter for general adult medical examination without abnormal findings: Secondary | ICD-10-CM

## 2017-02-25 NOTE — Progress Notes (Signed)
Subjective:   Stacey Hanson is a 71 y.o. female who presents for Medicare Annual (Subsequent) preventive examination.          Objective:     Vitals: BP 122/78   Pulse (!) 110   Resp 16   Ht 5' 9" (1.753 m)   Wt 219 lb (99.3 kg)   BMI 32.34 kg/m   Body mass index is 32.34 kg/m.  Advanced Directives 02/25/2016 10/03/2010  Does Patient Have a Medical Advance Directive? No Patient does not have advance directive  Would patient like information on creating a medical advance directive? Yes (MAU/Ambulatory/Procedural Areas - Information given) -  Pre-existing out of facility DNR order (yellow form or pink MOST form) - No    Tobacco Social History   Tobacco Use  Smoking Status Former Smoker  . Types: Cigarettes  . Last attempt to quit: 02/20/1985  . Years since quitting: 32.0  Smokeless Tobacco Never Used     Counseling given: Not Answered   Clinical Intake:  Pre-visit preparation completed: Yes  Pain : 0-10 Pain Score: 8  Pain Type: Chronic pain Pain Location: Back Pain Orientation: Lower Pain Descriptors / Indicators: Aching     Nutritional Status: BMI > 30  Obese Diabetes: Yes Did pt. bring in CBG monitor from home?: No  How often do you need to have someone help you when you read instructions, pamphlets, or other written materials from your doctor or pharmacy?: 2 - Rarely What is the last grade level you completed in school?: 10  Interpreter Needed?: No  Information entered by :: Kate Sable LPN   Past Medical History:  Diagnosis Date  . BMI 30.0-30.9,adult 2012 215 LBS   2004 198 LBS  . Diabetes mellitus   . 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 H. pylori infection 09/24/2010  . Hyperlipidemia   . Hypertension   . IDDM (insulin dependent diabetes mellitus) ([place]) 1987  . Knee pain   . Obesity    Past Surgical History:  Procedure Laterality Date  . ABDOMINAL HYSTERECTOMY  1970  . CHOLECYSTECTOMY  OCT 2010 BZ BILIARY  DYSKINESIA   CHRONIC CHOLECYSTITIS  . COLONOSCOPY  MAY 2009 SCREENING   pTC TICS, SML IH  . ESOPHAGOGASTRODUODENOSCOPY  10/03/2010   Procedure: ESOPHAGOGASTRODUODENOSCOPY (EGD);  Surgeon: Dorothyann Peng, MD;  [facility];  Service: Endoscopy;  Laterality: N/A;  . ESOPHAGOGASTRODUODENOSCOPY  12/19/2002   RMR: Normal esophagus/Normal stomach/ Duodenum large bulbar diverticulum, 1 cm bulbar ulcer with surrounding/ inflammation as described above. Normal D2  . SAVORY DILATION  10/03/2010   Procedure: SAVORY DILATION;  Surgeon: Dorothyann Peng, MD;  [facility];  Service: Endoscopy;;  . TOTAL ABDOMINAL HYSTERECTOMY  1970  . UPPER GASTROINTESTINAL ENDOSCOPY  OCT 2004 RMR   DUO TIC, & ULCER   Family History  Problem Relation Age of Onset  . Diabetes Mother   . Hypertension Mother   . Heart failure Mother   . Cancer Father 65       prostate and colon  . Diabetes Sister   . Hypertension Sister   . Hypertension Sister   . Diabetes Sister   . Diabetes Brother   . Hypertension Brother   . Diabetes Brother   . Hypertension Brother   . Coronary artery disease Brother 35       MI  . Colon cancer Neg Hx   . Colon polyps Neg Hx    Social History  Socioeconomic History  . Marital status: Single    Spouse name: None  . Number of children: None  . Years of education: None  . Highest education level: None  Social Needs  . Financial resource strain: None  . Food insecurity - worry: None  . Food insecurity - inability: None  . Transportation needs - medical: None  . Transportation needs - non-medical: None  Occupational History  . Occupation: [employer]  Tobacco Use  . Smoking status: Former Smoker    Types: Cigarettes    Last attempt to quit: 02/20/1985    Years since quitting: 32.0  . Smokeless tobacco: Never Used  Substance and Sexual Activity  . Alcohol use: No  . Drug use: No  . Sexual activity: Not Currently  Other Topics Concern  . None  Social History  Narrative  . None    Outpatient Encounter Medications as of 02/25/2017  Medication Sig  . ASPIRIN LOW DOSE 81 MG EC tablet TAKE 1 TABLET(81 MG) BY MOUTH DAILY  . B-D UF III MINI PEN NEEDLES 31G X 5 MM MISC USE FOR TWICE DAILY INJECTIONS OF HUMALOG  . benazepril (LOTENSIN) 40 MG tablet TAKE 1 TABLET(40 MG) BY MOUTH DAILY  . Blood Glucose Monitoring Suppl ([place]) KIT by Does not apply route 2 (two) times daily. Two times a day testing   . Calcium Carbonate-Vitamin D (CALCIUM PLUS VITAMIN D PO) Take by mouth 2 (two) times daily. Take two tablets daily   . glucose blood (ONE TOUCH ULTRA TEST) test strip Use as instructed twice daily dx 250.01  . glucose blood (TRUETRACK TEST) test strip 1 each by Other route 2 (two) times daily. Two times a day   . HUMALOG MIX 75/25 KWIKPEN (75-25) 100 UNIT/ML Kwikpen INJECT 40 UNITS INTO THE SKIN TWICE DAILY  . hydrochlorothiazide (HYDRODIURIL) 25 MG tablet TAKE 1 TABLET(25 MG) BY MOUTH DAILY  . levothyroxine (SYNTHROID, LEVOTHROID) 88 MCG tablet TAKE 1 TABLET BY MOUTH EVERY DAY IN THE MORNING  . loratadine (CLARITIN) 10 MG tablet Take 10 mg by mouth daily as needed for allergies.  . metFORMIN (GLUCOPHAGE) 1000 MG tablet TAKE 1 TABLET(1000 MG) BY MOUTH TWICE DAILY WITH A MEAL  . Multiple Vitamin (MULTIVITAMIN) capsule Take 1 capsule by mouth daily.    Marland Kitchen olopatadine (PATANOL) 0.1 % ophthalmic solution Place 1 drop into both eyes 2 (two) times daily. (Patient taking differently: Place 1 drop into both eyes as needed. )  . omeprazole (PRILOSEC) 20 MG capsule TAKE 1 CAPSULE BY MOUTH EVERY MORNING  . ONETOUCH DELICA LANCETS 15Z MISC Use as directed twice daily dx 250.01  . pravastatin (PRAVACHOL) 40 MG tablet TAKE 1 TABLET(40 MG) BY MOUTH DAILY  . UNABLE TO FIND Diabetic shoes   No facility-administered encounter medications on file as of 02/25/2017.     Activities of Daily Living In your present state of health, do you have any difficulty  performing the following activities: 02/25/2017  Hearing? N  Vision? N  Difficulty concentrating or making decisions? N  Walking or climbing stairs? Y  Dressing or bathing? N  Doing errands, shopping? N  Some recent data might be hidden    Patient Care Team: Fayrene Helper, MD as PCP - [facility], Marga Melnick, MD (Gastroenterology) Leta Baptist, MD as Attending Physician (Otolaryngology) Cassandria Anger, MD (Endocrinology) Carole Civil, MD as Consulting Physician (Orthopedic Surgery) Madelin Headings, DO (Optometry)    Assessment:   This is a routine wellness examination  for [employer].  Exercise Activities and Dietary recommendations: heart healthy and low carb/fat diet     Goals    None      Fall Risk Fall Risk  02/25/2017 11/03/2016 04/08/2016 07/24/2015 02/21/2015  Falls in the past year? _0   Risk for fall due to : - - - - -   Is the patient's home free of loose throw rugs in walkways, pet beds, electrical cords, etc?   no      Grab bars in the bathroom? yes      Handrails on the stairs?   yes      Adequate lighting?   yes    Depression Screen PHQ 2/9 Scores 02/25/2017 11/03/2016 04/08/2016 07/24/2015  PHQ - 2 Score 1 0 0 0  PHQ- 9 Score - - - -     Cognitive Function     6CIT Screen 02/25/2017 02/25/2016  What Year? 0 points 0 points  What month? 0 points 0 points  What time? 0 points 0 points  Count back from 20 0 points 0 points  Months in reverse 0 points 0 points  Repeat phrase 0 points 0 points  Total Score 0 0    Immunization History  Administered Date(s) Administered  . Influenza,inj,Quad PF,6+ Mos 11/16/2012, 11/15/2013, 11/14/2014, 11/14/2015, 11/03/2016  . Pneumococcal Conjugate-13 10/11/2013  . Pneumococcal Polysaccharide-23 10/07/2011  . Zoster 04/05/2006    Qualifies for Shingles Vaccine? yes  Screening Tests Health Maintenance  Topic Date Due  . COLONOSCOPY  07/10/2017  . HEMOGLOBIN A1C  08/12/2017  . OPHTHALMOLOGY  EXAM  09/30/2017  . FOOT EXAM  11/06/2017  . MAMMOGRAM  07/11/2018  . TETANUS/TDAP  08/01/2019  . INFLUENZA VACCINE  Completed  . DEXA SCAN  Completed  . Hepatitis C Screening  Completed  . PNA vac Low Risk Adult  Completed    Cancer Screenings: Lung: Low Dose CT Chest recommended if Age 36-80 years, 30 pack-year currently smoking OR have quit w/in 15years. Patient does not qualify. Breast:  Up to date on Mammogram? Yes   Up to date of Bone Density/Dexa? No. Referred and scheduled  Colorectal: due 2019  Additional Screenings:  Hepatitis B/HIV/Syphillis: Hepatitis C Screening:      Plan:      I have personally reviewed and noted the following in the patient's chart:   . Medical and social history . Use of alcohol, tobacco or illicit drugs  . Current medications and supplements . Functional ability and status . Nutritional status . Physical activity . Advanced directives . List of other physicians . Hospitalizations, surgeries, and ER visits in previous 12 months . Vitals . Screenings to include cognitive, depression, and falls . Referrals and appointments  In addition, I have reviewed and discussed with patient certain preventive protocols, quality metrics, and best practice recommendations. A written personalized care plan for preventive services as well as general preventive health recommendations were provided to patient.     Kate Sable, LPN, LPN  03/50/0938      Subjective:   Stacey Hanson is a 71 y.o. female who presents for Medicare Annual (Subsequent) preventive examination.          Objective:     Vitals: BP 122/78   Pulse (!) 110   Resp 16   Ht 5' 9" (1.753 m)   Wt 219 lb (99.3 kg)   BMI 32.34 kg/m   Body mass index is 32.34 kg/m.  Advanced Directives 02/25/2016 10/03/2010  Does Patient  Have a Medical Advance Directive? No Patient does not have advance directive  Would patient like information on creating a medical advance directive? Yes  (MAU/Ambulatory/Procedural Areas - Information given) -  Pre-existing out of facility DNR order (yellow form or pink MOST form) - No    Tobacco Social History   Tobacco Use  Smoking Status Former Smoker  . Types: Cigarettes  . Last attempt to quit: 02/20/1985  . Years since quitting: 32.0  Smokeless Tobacco Never Used     Counseling given: Not Answered   Clinical Intake:  Pre-visit preparation completed: Yes  Pain : 0-10 Pain Score: 8  Pain Type: Chronic pain Pain Location: Back Pain Orientation: Lower Pain Descriptors / Indicators: Aching     Nutritional Status: BMI > 30  Obese Diabetes: Yes Did pt. bring in CBG monitor from home?: No  How often do you need to have someone help you when you read instructions, pamphlets, or other written materials from your doctor or pharmacy?: 2 - Rarely What is the last grade level you completed in school?: 10  Interpreter Needed?: No  Information entered by :: Kate Sable LPN   Past Medical History:  Diagnosis Date  . BMI 30.0-30.9,adult 2012 215 LBS   2004 198 LBS  . Diabetes mellitus   . 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 H. pylori infection 09/24/2010  . Hyperlipidemia   . Hypertension   . IDDM (insulin dependent diabetes mellitus) ([place]) 1987  . Knee pain   . Obesity    Past Surgical History:  Procedure Laterality Date  . ABDOMINAL HYSTERECTOMY  1970  . CHOLECYSTECTOMY  OCT 2010 BZ BILIARY DYSKINESIA   CHRONIC CHOLECYSTITIS  . COLONOSCOPY  MAY 2009 SCREENING   pTC TICS, SML IH  . ESOPHAGOGASTRODUODENOSCOPY  10/03/2010   Procedure: ESOPHAGOGASTRODUODENOSCOPY (EGD);  Surgeon: Dorothyann Peng, MD;  [facility];  Service: Endoscopy;  Laterality: N/A;  . ESOPHAGOGASTRODUODENOSCOPY  12/19/2002   RMR: Normal esophagus/Normal stomach/ Duodenum large bulbar diverticulum, 1 cm bulbar ulcer with surrounding/ inflammation as described above. Normal D2  . SAVORY DILATION  10/03/2010   Procedure:  SAVORY DILATION;  Surgeon: Dorothyann Peng, MD;  [facility];  Service: Endoscopy;;  . TOTAL ABDOMINAL HYSTERECTOMY  1970  . UPPER GASTROINTESTINAL ENDOSCOPY  OCT 2004 RMR   DUO TIC, & ULCER   Family History  Problem Relation Age of Onset  . Diabetes Mother   . Hypertension Mother   . Heart failure Mother   . Cancer Father 33       prostate and colon  . Diabetes Sister   . Hypertension Sister   . Hypertension Sister   . Diabetes Sister   . Diabetes Brother   . Hypertension Brother   . Diabetes Brother   . Hypertension Brother   . Coronary artery disease Brother 35       MI  . Colon cancer Neg Hx   . Colon polyps Neg Hx    Social History   Socioeconomic History  . Marital status: Single    Spouse name: None  . Number of children: None  . Years of education: None  . Highest education level: None  Social Needs  . Financial resource strain: None  . Food insecurity - worry: None  . Food insecurity - inability: None  . Transportation needs - medical: None  . Transportation needs - non-medical: None  Occupational History  . Occupation: [employer]OR  Tobacco  Use  . Smoking status: Former Smoker    Types: Cigarettes    Last attempt to quit: 02/20/1985    Years since quitting: 32.0  . Smokeless tobacco: Never Used  Substance and Sexual Activity  . Alcohol use: No  . Drug use: No  . Sexual activity: Not Currently  Other Topics Concern  . None  Social History Narrative  . None    Outpatient Encounter Medications as of 02/25/2017  Medication Sig  . ASPIRIN LOW DOSE 81 MG EC tablet TAKE 1 TABLET(81 MG) BY MOUTH DAILY  . B-D UF III MINI PEN NEEDLES 31G X 5 MM MISC USE FOR TWICE DAILY INJECTIONS OF HUMALOG  . benazepril (LOTENSIN) 40 MG tablet TAKE 1 TABLET(40 MG) BY MOUTH DAILY  . Blood Glucose Monitoring Suppl ([place]) KIT by Does not apply route 2 (two) times daily. Two times a day testing   . Calcium Carbonate-Vitamin D (CALCIUM PLUS VITAMIN D  PO) Take by mouth 2 (two) times daily. Take two tablets daily   . glucose blood (ONE TOUCH ULTRA TEST) test strip Use as instructed twice daily dx 250.01  . glucose blood (TRUETRACK TEST) test strip 1 each by Other route 2 (two) times daily. Two times a day   . HUMALOG MIX 75/25 KWIKPEN (75-25) 100 UNIT/ML Kwikpen INJECT 40 UNITS INTO THE SKIN TWICE DAILY  . hydrochlorothiazide (HYDRODIURIL) 25 MG tablet TAKE 1 TABLET(25 MG) BY MOUTH DAILY  . levothyroxine (SYNTHROID, LEVOTHROID) 88 MCG tablet TAKE 1 TABLET BY MOUTH EVERY DAY IN THE MORNING  . loratadine (CLARITIN) 10 MG tablet Take 10 mg by mouth daily as needed for allergies.  . metFORMIN (GLUCOPHAGE) 1000 MG tablet TAKE 1 TABLET(1000 MG) BY MOUTH TWICE DAILY WITH A MEAL  . Multiple Vitamin (MULTIVITAMIN) capsule Take 1 capsule by mouth daily.    Marland Kitchen olopatadine (PATANOL) 0.1 % ophthalmic solution Place 1 drop into both eyes 2 (two) times daily. (Patient taking differently: Place 1 drop into both eyes as needed. )  . omeprazole (PRILOSEC) 20 MG capsule TAKE 1 CAPSULE BY MOUTH EVERY MORNING  . ONETOUCH DELICA LANCETS 16X MISC Use as directed twice daily dx 250.01  . pravastatin (PRAVACHOL) 40 MG tablet TAKE 1 TABLET(40 MG) BY MOUTH DAILY  . UNABLE TO FIND Diabetic shoes   No facility-administered encounter medications on file as of 02/25/2017.     Activities of Daily Living In your present state of health, do you have any difficulty performing the following activities: 02/25/2017  Hearing? N  Vision? N  Difficulty concentrating or making decisions? N  Walking or climbing stairs? Y  Dressing or bathing? N  Doing errands, shopping? N  Some recent data might be hidden    Patient Care Team: Fayrene Helper, MD as PCP - [facility], Marga Melnick, MD (Gastroenterology) Leta Baptist, MD as Attending Physician (Otolaryngology) Cassandria Anger, MD (Endocrinology) Carole Civil, MD as Consulting Physician (Orthopedic  Surgery) Madelin Headings, DO (Optometry)    Assessment:   This is a routine wellness examination for Stacey Hanson.  Exercise Activities and Dietary recommendations    Goals    None      Fall Risk Fall Risk  02/25/2017 11/03/2016 04/08/2016 07/24/2015 02/21/2015  Falls in the past year? _0   Risk for fall due to : - - - - -   Is the patient's home free of loose throw rugs in walkways, pet beds, electrical cords, etc?   yes  Grab bars in the bathroom? yes      Handrails on the stairs?   yes      Adequate lighting?   yes    Depression Screen PHQ 2/9 Scores 02/25/2017 11/03/2016 04/08/2016 07/24/2015  PHQ - 2 Score 1 0 0 0  PHQ- 9 Score - - - -     Cognitive Function     6CIT Screen 02/25/2017 02/25/2016  What Year? 0 points 0 points  What month? 0 points 0 points  What time? 0 points 0 points  Count back from 20 0 points 0 points  Months in reverse 0 points 0 points  Repeat phrase 0 points 0 points  Total Score 0 0    Immunization History  Administered Date(s) Administered  . Influenza,inj,Quad PF,6+ Mos 11/16/2012, 11/15/2013, 11/14/2014, 11/14/2015, 11/03/2016  . Pneumococcal Conjugate-13 10/11/2013  . Pneumococcal Polysaccharide-23 10/07/2011  . Zoster 04/05/2006    Qualifies for Shingles Vaccine? Discuss with pcp  Screening Tests Health Maintenance  Topic Date Due  . COLONOSCOPY  07/10/2017  . HEMOGLOBIN A1C  08/12/2017  . OPHTHALMOLOGY EXAM  09/30/2017  . FOOT EXAM  11/06/2017  . MAMMOGRAM  07/11/2018  . TETANUS/TDAP  08/01/2019  . INFLUENZA VACCINE  Completed  . DEXA SCAN  Completed  . Hepatitis C Screening  Completed  . PNA vac Low Risk Adult  Completed    Cancer Screenings: Lung: Low Dose CT Chest recommended if Age 71-80 years, 30 pack-year currently smoking OR have quit w/in 15years. Patient does not qualify. Breast:  Up to date on Mammogram? Yes   Up to date of Bone Density/Dexa? Yes referred Colorectal: done  Additional Screenings:   Hepatitis B/HIV/Syphillis: Hepatitis C Screening:      Plan:    I have personally reviewed and noted the following in the patient's chart:   . Medical and social history . Use of alcohol, tobacco or illicit drugs  . Current medications and supplements . Functional ability and status . Nutritional status . Physical activity . Advanced directives . List of other physicians . Hospitalizations, surgeries, and ER visits in previous 12 months . Vitals . Screenings to include cognitive, depression, and falls . Referrals and appointments  In addition, I have reviewed and discussed with patient certain preventive protocols, quality metrics, and best practice recommendations. A written personalized care plan for preventive services as well as general preventive health recommendations were provided to patient.     Kate Sable, LPN, LPN  [id number]/45/[id number]

## 2017-02-25 NOTE — Patient Instructions (Addendum)
Stacey Hanson , Thank you for taking time to come for your Medicare Wellness Visit. I appreciate your ongoing commitment to your health goals. Please review the following plan we discussed and let me know if I can assist you in the future.   Screening recommendations/referrals: Colonoscopy: Due 2019 Mammogram: Done Bone Density: Referred Recommended yearly ophthalmology/optometry visit for glaucoma screening and checkup Recommended yearly dental visit for hygiene and checkup  Vaccinations: Influenza vaccine: complete Pneumococcal vaccine: complete Tdap vaccine: 08/01/2019 Shingles vaccine: Due  Advanced directives: form given  Conditions/risks identified: none   Referred for bone denisty. Will call with appt.   Preventive Care 71 Years and Older, Female Preventive care refers to lifestyle choices and visits with your health care provider that can promote health and wellness. What does preventive care include?  A yearly physical exam. This is also called an annual well check.  Dental exams once or twice a year.  Routine eye exams. Ask your health care provider how often you should have your eyes checked.  Personal lifestyle choices, including:  Daily care of your teeth and gums.  Regular physical activity.  Eating a healthy diet.  Avoiding tobacco and drug use.  Limiting alcohol use.  Practicing safe sex.  Taking low-dose aspirin every day.  Taking vitamin and mineral supplements as recommended by your health care provider. What happens during an annual well check? The services and screenings done by your health care provider during your annual well check will depend on your age, overall health, lifestyle risk factors, and family history of disease. Counseling  Your health care provider may ask you questions about your:  Alcohol use.  Tobacco use.  Drug use.  Emotional well-being.  Home and relationship well-being.  Sexual activity.  Eating  habits.  History of falls.  Memory and ability to understand (cognition).  Work and work Statistician.  Reproductive health. Screening  You may have the following tests or measurements:  Height, weight, and BMI.  Blood pressure.  Lipid and cholesterol levels. These may be checked every 5 years, or more frequently if you are over 71 years old.  Skin check.  Lung cancer screening. You may have this screening every year starting at age 71 if you have a 30-pack-year history of smoking and currently smoke or have quit within the past 15 years.  Fecal occult blood test (FOBT) of the stool. You may have this test every year starting at age 71.  Flexible sigmoidoscopy or colonoscopy. You may have a sigmoidoscopy every 5 years or a colonoscopy every 10 years starting at age 71.  Hepatitis C blood test.  Hepatitis B blood test.  Sexually transmitted disease (STD) testing.  Diabetes screening. This is done by checking your blood sugar (glucose) after you have not eaten for a while (fasting). You may have this done every 1-3 years.  Bone density scan. This is done to screen for osteoporosis. You may have this done starting at age 71.  Mammogram. This may be done every 1-2 years. Talk to your health care provider about how often you should have regular mammograms. Talk with your health care provider about your test results, treatment options, and if necessary, the need for more tests. Vaccines  Your health care provider may recommend certain vaccines, such as:  Influenza vaccine. This is recommended every year.  Tetanus, diphtheria, and acellular pertussis (Tdap, Td) vaccine. You may need a Td booster every 10 years.  Zoster vaccine. You may need this after age 71.  Pneumococcal 13-valent conjugate (PCV13) vaccine. One dose is recommended after age 21.  Pneumococcal polysaccharide (PPSV23) vaccine. One dose is recommended after age 71. Talk to your health care provider about which  screenings and vaccines you need and how often you need them. This information is not intended to replace advice given to you by your health care provider. Make sure you discuss any questions you have with your health care provider. Document Released: 03/15/2015 Document Revised: 11/06/2015 Document Reviewed: 12/18/2014 Elsevier Interactive Patient Education  2017 [place] Prevention in the Home Falls can cause injuries. They can happen to people of all ages. There are many things you can do to make your home safe and to help prevent falls. What can I do on the outside of my home?  Regularly fix the edges of walkways and driveways and fix any cracks.  Remove anything that might make you trip as you walk through a door, such as a raised step or threshold.  Trim any bushes or trees on the path to your home.  Use bright outdoor lighting.  Clear any walking paths of anything that might make someone trip, such as rocks or tools.  Regularly check to see if handrails are loose or broken. Make sure that both sides of any steps have handrails.  Any raised decks and porches should have guardrails on the edges.  Have any leaves, snow, or ice cleared regularly.  Use sand or salt on walking paths during winter.  Clean up any spills in your garage right away. This includes oil or grease spills. What can I do in the bathroom?  Use night lights.  Install grab bars by the toilet and in the tub and shower. Do not use towel bars as grab bars.  Use non-skid mats or decals in the tub or shower.  If you need to sit down in the shower, use a plastic, non-slip stool.  Keep the floor dry. Clean up any water that spills on the floor as soon as it happens.  Remove soap buildup in the tub or shower regularly.  Attach bath mats securely with double-sided non-slip rug tape.  Do not have throw rugs and other things on the floor that can make you trip. What can I do in the bedroom?  Use  night lights.  Make sure that you have a light by your bed that is easy to reach.  Do not use any sheets or blankets that are too big for your bed. They should not hang down onto the floor.  Have a firm chair that has side arms. You can use this for support while you get dressed.  Do not have throw rugs and other things on the floor that can make you trip. What can I do in the kitchen?  Clean up any spills right away.  Avoid walking on wet floors.  Keep items that you use a lot in easy-to-reach places.  If you need to reach something above you, use a strong step stool that has a grab bar.  Keep electrical cords out of the way.  Do not use floor polish or wax that makes floors slippery. If you must use wax, use non-skid floor wax.  Do not have throw rugs and other things on the floor that can make you trip. What can I do with my stairs?  Do not leave any items on the stairs.  Make sure that there are handrails on both sides of the stairs and use them.  Fix handrails that are broken or loose. Make sure that handrails are as long as the stairways.  Check any carpeting to make sure that it is firmly attached to the stairs. Fix any carpet that is loose or worn.  Avoid having throw rugs at the top or bottom of the stairs. If you do have throw rugs, attach them to the floor with carpet tape.  Make sure that you have a light switch at the top of the stairs and the bottom of the stairs. If you do not have them, ask someone to add them for you. What else can I do to help prevent falls?  Wear shoes that:  Do not have high heels.  Have rubber bottoms.  Are comfortable and fit you well.  Are closed at the toe. Do not wear sandals.  If you use a stepladder:  Make sure that it is fully opened. Do not climb a closed stepladder.  Make sure that both sides of the stepladder are locked into place.  Ask someone to hold it for you, if possible.  Clearly mark and make sure that you  can see:  Any grab bars or handrails.  First and last steps.  Where the edge of each step is.  Use tools that help you move around (mobility aids) if they are needed. These include:  Canes.  Walkers.  Scooters.  Crutches.  Turn on the lights when you go into a dark area. Replace any light bulbs as soon as they burn out.  Set up your furniture so you have a clear path. Avoid moving your furniture around.  If any of your floors are uneven, fix them.  If there are any pets around you, be aware of where they are.  Review your medicines with your doctor. Some medicines can make you feel dizzy. This can increase your chance of falling. Ask your doctor what other things that you can do to help prevent falls. This information is not intended to replace advice given to you by your health care provider. Make sure you discuss any questions you have with your health care provider. Document Released: 12/13/2008 Document Revised: 07/25/2015 Document Reviewed: 03/23/2014 Elsevier Interactive Patient Education  2017 [employer].

## 2017-03-04 ENCOUNTER — Ambulatory Visit (HOSPITAL_COMMUNITY)
Admission: RE | Admit: 2017-03-04 | Discharge: 2017-03-04 | Disposition: A | Discharge status: Home / Self Care | Payer: Medicare Other | Source: Ambulatory Visit | Attending: Family Medicine | Admitting: Family Medicine

## 2017-03-04 DIAGNOSIS — M8588 Other specified disorders of bone density and structure, other site: Secondary | ICD-10-CM | POA: Diagnosis not present

## 2017-03-04 DIAGNOSIS — M8589 Other specified disorders of bone density and structure, multiple sites: Secondary | ICD-10-CM | POA: Diagnosis not present

## 2017-03-04 DIAGNOSIS — M85852 Other specified disorders of bone density and structure, left thigh: Secondary | ICD-10-CM | POA: Insufficient documentation

## 2017-03-04 DIAGNOSIS — Z780 Asymptomatic menopausal state: Secondary | ICD-10-CM | POA: Insufficient documentation

## 2017-03-09 ENCOUNTER — Encounter: Payer: Self-pay | Admitting: Family Medicine

## 2017-03-09 ENCOUNTER — Other Ambulatory Visit: Payer: Self-pay | Admitting: Family Medicine

## 2017-03-15 ENCOUNTER — Telehealth: Payer: Self-pay

## 2017-03-15 NOTE — Telephone Encounter (Signed)
Needs refill on diabetic supply needs strips and lancets.

## 2017-03-15 NOTE — Telephone Encounter (Signed)
rx sent in 

## 2017-03-16 ENCOUNTER — Other Ambulatory Visit: Payer: Self-pay | Admitting: Family Medicine

## 2017-03-18 ENCOUNTER — Ambulatory Visit: Payer: Medicare Other | Admitting: Gastroenterology

## 2017-03-18 ENCOUNTER — Other Ambulatory Visit: Payer: Self-pay

## 2017-03-18 ENCOUNTER — Telehealth: Payer: Self-pay | Admitting: Family Medicine

## 2017-03-18 MED ORDER — GLUCOSE BLOOD VI STRP: ORAL_STRIP | 5 refills | Status: DC

## 2017-03-18 MED ORDER — ONETOUCH DELICA LANCETS 33G MISC: 5 refills | Status: DC

## 2017-03-19 ENCOUNTER — Telehealth: Payer: Self-pay | Admitting: Family Medicine

## 2017-03-19 NOTE — Telephone Encounter (Signed)
Please reach out to the patient regarding the lab work.  [phone]

## 2017-03-23 ENCOUNTER — Telehealth: Payer: Self-pay | Admitting: Nutrition

## 2017-03-23 ENCOUNTER — Encounter: Payer: Medicare Other | Attending: Family Medicine | Admitting: Nutrition

## 2017-03-23 VITALS — Ht 69.0 in | Wt 216.0 lb

## 2017-03-23 DIAGNOSIS — Z794 Long term (current) use of insulin: Secondary | ICD-10-CM | POA: Diagnosis not present

## 2017-03-23 DIAGNOSIS — E119 Type 2 diabetes mellitus without complications: Secondary | ICD-10-CM | POA: Insufficient documentation

## 2017-03-23 DIAGNOSIS — Z713 Dietary counseling and surveillance: Secondary | ICD-10-CM | POA: Insufficient documentation

## 2017-03-23 DIAGNOSIS — E669 Obesity, unspecified: Secondary | ICD-10-CM

## 2017-03-23 DIAGNOSIS — IMO0002 Reserved for concepts with insufficient information to code with codable children: Secondary | ICD-10-CM

## 2017-03-23 DIAGNOSIS — E1165 Type 2 diabetes mellitus with hyperglycemia: Secondary | ICD-10-CM

## 2017-03-23 DIAGNOSIS — E118 Type 2 diabetes mellitus with unspecified complications: Secondary | ICD-10-CM

## 2017-03-23 NOTE — Progress Notes (Signed)
  Medical Nutrition Therapy:  Appt start time: 1330 end time:  1500.   Assessment:  Primary concerns today: Diabetes. LIves by herself. Had DM forf > 10 yrs since 1988.  Eats three meals per day. She eats most meals at home but eats out 1-2 times per week. Most foods are baked and fried and boiled.  She admits to snacking throughtout the day. Physically activity: going [place] 3-4 times per week with [employer].  Has recently started snacking on fruit instead of chips and cookies and junk food. 75/25 insulin  40 units BID. Metformin 1000 mg BID. Didn't bring meter. BS  130-200's before breakfast and dinner. Eats three meals per day. Engaged and wants to make changes with diet and exercise to improve her health and reduce complications.  Lab Results  Component Value Date   HGBA1C 8.1 (H) 02/11/2017     Preferred Learning Style:  No preference indicated   Learning Readiness:  Ready  Change in progress   MEDICATIONS: See list   DIETARY INTAKE:  24-hr recall:  B ( AM): Bacon and eggs or Cherrios or cornflakes or rasin bran with 2% milk with whole banana or peaches,  Snk ( AM): chips or pretzels, cookies. And now changed to fruit L ( PM):  Bologna sandwich on wheat bread, pB crackers,  water Snk ( PM): chips or cookies or ice cream D ( PM): BBQ chicken, white beans, and greens, water Snk ( PM):  Beverages: water  Usual physical activity: [employer]  Estimated energy needs: 1500  calories 170  g carbohydrates 112 g protein 42 g fat  Progress Towards Goal(s):  In progress.   Nutritional Diagnosis:  NB-1.1 Food and nutrition-related knowledge deficit As related to Diabetes Type 2.  As evidenced by A1C 8.1%.    Intervention:  Nutrition and Diabetes education provided on My Plate, CHO counting, meal planning, portion sizes, timing of meals, avoiding snacks between meals unless having a low blood sugar, target ranges for A1C and blood sugars, signs/symptoms and treatment of  hyper/hypoglycemia, monitoring blood sugars, taking medications as prescribed, benefits of exercising 30 minutes per day and prevention of complications of DM. Marland KitchenGoals 1. Follow MY plate 2. Eat 3 carb choices per meal 3. Avoid snacks unless eating veggies 4. Eat fruit with meals instead of snacks 5. Keep working out at [employer] 4 times per week. Take 75/25 insulin 40 units twice a day  Teaching Method Utilized:  Visual Auditory Hands on  Handouts given during visit include:  The Plate Method   Meal Plan Card  Diabetes Instructions.   Barriers to learning/adherence to lifestyle change: none  Demonstrated degree of understanding via:  Teach Back   Monitoring/Evaluation:  Dietary intake, exercise, meal planning, SBG, and body weight in 1 month(s).

## 2017-03-23 NOTE — Telephone Encounter (Signed)
Talked to pt and informed her to contact [facility] to give them permission to send her Libre CGM.Marland Kitchen She verbalized she understanding and would contact them [phone].

## 2017-03-23 NOTE — Patient Instructions (Addendum)
Goals 1. Follow MY plate 2. Eat 3 carb choices per meal 3. Avoid snacks unless eating veggies 4. Eat fruit with meals instead of snacks 5. Keep working out at [employer] 4 times per week. Take 75/25 insulin 40 units twice a day.

## 2017-03-24 ENCOUNTER — Encounter: Payer: Self-pay | Admitting: Nutrition

## 2017-03-26 NOTE — Telephone Encounter (Signed)
Reviewed with her

## 2017-04-02 ENCOUNTER — Other Ambulatory Visit: Payer: Self-pay | Admitting: "Endocrinology

## 2017-04-02 DIAGNOSIS — E039 Hypothyroidism, unspecified: Secondary | ICD-10-CM

## 2017-04-05 DIAGNOSIS — E039 Hypothyroidism, unspecified: Secondary | ICD-10-CM | POA: Diagnosis not present

## 2017-04-06 LAB — TSH: TSH: 0.614 u[IU]/mL (ref 0.450–4.500)

## 2017-04-06 LAB — T4, FREE: Free T4: 1.67 ng/dL (ref 0.82–1.77)

## 2017-04-06 NOTE — Telephone Encounter (Signed)
close

## 2017-04-08 ENCOUNTER — Ambulatory Visit: Payer: Medicare Other | Admitting: "Endocrinology

## 2017-04-08 ENCOUNTER — Encounter: Payer: Self-pay | Admitting: "Endocrinology

## 2017-04-08 ENCOUNTER — Other Ambulatory Visit: Payer: Self-pay | Admitting: Family Medicine

## 2017-04-08 VITALS — Ht 69.0 in | Wt 211.0 lb

## 2017-04-08 DIAGNOSIS — E890 Postprocedural hypothyroidism: Secondary | ICD-10-CM | POA: Diagnosis not present

## 2017-04-08 MED ORDER — LEVOTHYROXINE SODIUM 88 MCG PO TABS: ORAL_TABLET | ORAL | 12 refills | Status: DC

## 2017-04-08 NOTE — Progress Notes (Signed)
Subjective:    Patient ID: Stacey Hanson, female    DOB: [birth year], PCP Fayrene Helper, MD   Past Medical History:  Diagnosis Date  . BMI 30.0-30.9,adult 2012 215 LBS   2004 198 LBS  . Diabetes mellitus   . 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 H. pylori infection 09/24/2010  . Hyperlipidemia   . Hypertension   . IDDM (insulin dependent diabetes mellitus) ([place]) 1987  . Knee pain   . Obesity    Past Surgical History:  Procedure Laterality Date  . ABDOMINAL HYSTERECTOMY  1970  . CHOLECYSTECTOMY  OCT 2010 BZ BILIARY DYSKINESIA   CHRONIC CHOLECYSTITIS  . COLONOSCOPY  MAY 2009 SCREENING   pTC TICS, SML IH  . ESOPHAGOGASTRODUODENOSCOPY  10/03/2010   Procedure: ESOPHAGOGASTRODUODENOSCOPY (EGD);  Surgeon: Dorothyann Peng, MD;  Location: [facility];  Service: Endoscopy;  Laterality: N/A;  . ESOPHAGOGASTRODUODENOSCOPY  12/19/2002   RMR: Normal esophagus/Normal stomach/ Duodenum large bulbar diverticulum, 1 cm bulbar ulcer with surrounding/ inflammation as described above. Normal D2  . SAVORY DILATION  10/03/2010   Procedure: SAVORY DILATION;  Surgeon: Dorothyann Peng, MD;  Location: [facility];  Service: Endoscopy;;  . TOTAL ABDOMINAL HYSTERECTOMY  1970  . UPPER GASTROINTESTINAL ENDOSCOPY  OCT 2004 RMR   DUO TIC, & ULCER   Social History   Socioeconomic History  . Marital status: Single    Spouse name: None  . Number of children: None  . Years of education: None  . Highest education level: None  Social Needs  . Financial resource strain: None  . Food insecurity - worry: None  . Food insecurity - inability: None  . Transportation needs - medical: None  . Transportation needs - non-medical: None  Occupational History  . Occupation: [employer]OR  Tobacco Use  . Smoking status: Former Smoker    Types: Cigarettes    Last attempt to quit: 02/20/1985    Years since quitting: 32.1  . Smokeless tobacco: Never Used  Substance and Sexual Activity  .  Alcohol use: No  . Drug use: No  . Sexual activity: Not Currently  Other Topics Concern  . None  Social History Narrative  . None   Outpatient Encounter Medications as of 04/08/2017  Medication Sig  . ASPIRIN LOW DOSE 81 MG EC tablet TAKE 1 TABLET(81 MG) BY MOUTH DAILY  . B-D UF III MINI PEN NEEDLES 31G X 5 MM MISC USE FOR TWICE DAILY INJECTIONS OF HUMALOG  . benazepril (LOTENSIN) 40 MG tablet TAKE 1 TABLET(40 MG) BY MOUTH DAILY  . Calcium Carbonate-Vitamin D (CALCIUM PLUS VITAMIN D PO) Take by mouth 2 (two) times daily. Take two tablets daily   . glucose blood (ONE TOUCH ULTRA TEST) test strip USE AS DIRECTED TWICE DAILY dx e11.9  . HUMALOG MIX 75/25 KWIKPEN (75-25) 100 UNIT/ML Kwikpen INJECT 40 UNITS INTO THE SKIN TWICE DAILY  . hydrochlorothiazide (HYDRODIURIL) 25 MG tablet TAKE 1 TABLET(25 MG) BY MOUTH DAILY  . levothyroxine (SYNTHROID, LEVOTHROID) 88 MCG tablet TAKE 1 TABLET BY MOUTH EVERY DAY IN THE MORNING  . loratadine (CLARITIN) 10 MG tablet Take 10 mg by mouth daily as needed for allergies.  . metFORMIN (GLUCOPHAGE) 1000 MG tablet TAKE 1 TABLET(1000 MG) BY MOUTH TWICE DAILY WITH A MEAL  . Multiple Vitamin (MULTIVITAMIN) capsule Take 1 capsule by mouth daily.    Marland Kitchen olopatadine (PATANOL) 0.1 % ophthalmic solution Place 1 drop into both eyes 2 (two)  times daily. (Patient taking differently: Place 1 drop into both eyes as needed. )  . omeprazole (PRILOSEC) 20 MG capsule TAKE 1 CAPSULE BY MOUTH EVERY MORNING  . ONETOUCH DELICA LANCETS 87G MISC USE  TO CHECK GLUCOSE TWICE DAILY AS DIRECTED dx e11.9  . pravastatin (PRAVACHOL) 40 MG tablet TAKE 1 TABLET(40 MG) BY MOUTH DAILY  . UNABLE TO FIND Diabetic shoes  . [DISCONTINUED] Blood Glucose Monitoring Suppl ([place]) KIT by Does not apply route 2 (two) times daily. Two times a day testing   . [DISCONTINUED] glucose blood (TRUETRACK TEST) test strip 1 each by Other route 2 (two) times daily. Two times a day   . [DISCONTINUED]  levothyroxine (SYNTHROID, LEVOTHROID) 88 MCG tablet TAKE 1 TABLET BY MOUTH EVERY DAY IN THE MORNING   No facility-administered encounter medications on file as of 04/08/2017.    ALLERGIES: Allergies  Allergen Reactions  . Simvastatin     REACTION: muscle cramps   VACCINATION STATUS: Immunization History  Administered Date(s) Administered  . Influenza,inj,Quad PF,6+ Mos 11/16/2012, 11/15/2013, 11/14/2014, 11/14/2015, 11/03/2016  . Pneumococcal Conjugate-13 10/11/2013  . Pneumococcal Polysaccharide-23 10/07/2011  . Zoster 04/05/2006    Hypertension  Pertinent negatives include no chest pain, headaches, palpitations or shortness of breath.  Diabetes  Pertinent negatives for hypoglycemia include no confusion, headaches, pallor or seizures. Pertinent negatives for diabetes include no chest pain, no fatigue, no polydipsia, no polyphagia and no polyuria.   72 yr old female with medical hx of type 2 DM, HTN,  RAI induced hypothyroidism, Obesity.   She was given RAI for toxic nodule 10/17/2010. She has hx of multinodular goiter s/p  FNA- benign. She returns for f/u  of her RAI induced hypothyroidism.  She is on levothyroxine 88 mcg p.o. every morning.  She reports compliance to his medication. She denies heat intolerance/cold intolerance. She has a steady weight lately. She also has type 2 diabetes and hypertension.  She would like to follow-up for this with Dr. Moshe Cipro.     Review of Systems  Constitutional: Negative for chills, fatigue, fever and unexpected weight change.  HENT: Negative for trouble swallowing and voice change.   Eyes: Negative for visual disturbance.  Respiratory: Negative for cough, shortness of breath and wheezing.   Cardiovascular: Negative for chest pain, palpitations and leg swelling.  Gastrointestinal: Negative for diarrhea, nausea and vomiting.  Endocrine: Negative for cold intolerance, heat intolerance, polydipsia, polyphagia and polyuria.  Musculoskeletal:  Negative for arthralgias and myalgias.  Skin: Negative for color change, pallor, rash and wound.  Neurological: Negative for seizures and headaches.  Psychiatric/Behavioral: Negative for confusion and suicidal ideas.    Objective:    Ht _0  (1.753 m)   Wt 211 lb (95.7 kg)   BMI 31.16 kg/m   Wt Readings from Last 3 Encounters:  04/08/17 211 lb (95.7 kg)  03/23/17 216 lb (98 kg)  02/25/17 219 lb (99.3 kg)    Physical Exam  Constitutional: She is oriented to person, place, and time. She appears well-developed.  HENT:  Head: Normocephalic and atraumatic.  Eyes: EOM are normal.  Neck: Normal range of motion. Neck supple. No tracheal deviation present. No thyromegaly present.  Cardiovascular: Normal rate and regular rhythm.  Pulmonary/Chest: Effort normal and breath sounds normal.  Abdominal: Soft. Bowel sounds are normal. There is no tenderness. There is no guarding.  Musculoskeletal: Normal range of motion. She exhibits no edema.  Neurological: She is alert and oriented to person, place, and time. She has  normal reflexes. No cranial nerve deficit. Coordination normal.  Skin: Skin is warm and dry. No rash noted. No erythema. No pallor.  Psychiatric: She has a normal mood and affect. Judgment normal.    CMP     Component Value Date/Time   NA 137 02/11/2017 1021   K 4.5 02/11/2017 1021   CL 95 (L) 02/11/2017 1021   CO2 24 02/11/2017 1021   GLUCOSE 207 (H) 02/11/2017 1021   GLUCOSE 41 (L) 03/12/2016 0925   BUN 19 02/11/2017 1021   CREATININE 0.98 02/11/2017 1021   CREATININE 0.96 (H) 03/12/2016 0925   CALCIUM 9.7 02/11/2017 1021   PROT 7.6 02/11/2017 1021   ALBUMIN 4.6 02/11/2017 1021   AST 27 02/11/2017 1021   ALT 20 02/11/2017 1021   ALKPHOS 89 02/11/2017 1021   BILITOT 0.3 02/11/2017 1021   GFRNONAA 58 (L) 02/11/2017 1021   GFRNONAA 60 03/12/2016 0925   GFRAA 67 02/11/2017 1021   GFRAA 69 03/12/2016 0925     Diabetic Labs (most recent): Lab Results  Component  Value Date   HGBA1C 8.1 (H) 02/11/2017   HGBA1C 8.2 (H) 10/29/2016   HGBA1C 7.0 (H) 06/19/2016     Lipid Panel ( most recent) Lipid Panel     Component Value Date/Time   CHOL 171 02/11/2017 1021   TRIG 78 02/11/2017 1021   HDL 64 02/11/2017 1021   CHOLHDL 2.7 02/11/2017 1021   CHOLHDL 2.2 01/11/2016 0916   VLDL 11 01/11/2016 0916   LDLCALC 91 02/11/2017 1021   - She has not done thyroid function tests since last visit.   Assessment & Plan:    1. Hypothyroidism, postradioiodine therapy -Her thyroid function tests are consistent with appropriate replacement.  I advised her to continue levothyroxine 88 mcg p.o. every morning.    - We discussed about correct intake of levothyroxine, at fasting, with water, separated by at least 30 minutes from breakfast, and separated by more than 4 hours from calcium, iron, multivitamins, acid reflux medications (PPIs). -Patient is made aware of the fact that thyroid hormone replacement is needed for life, dose to be adjusted by periodic monitoring of thyroid function tests.   2. Uncontrolled type 2 diabetes mellitus with stage 3 chronic kidney disease, with long-term current use of insulin (HCC)  - Her A1c is increasing to 8.1% from 6.2%.  -  She insists that she will address that with Dr. Moshe Cipro, to minimize doctor's visits for her.  I advised patient to continue Humalog 75/25  35 units with breakfast and supper when pre-meal blood glucose is above 90 mg/dL. I have Given her detailed carbs and exercise regimen. I advised her to continue metformin 500 mg by mouth twice a day .  - I advised patient to maintain close follow up with Fayrene Helper, MD for diabetes and all other primary care needs. Follow up plan: Return in about 1 year (around 04/08/2018) for follow up with pre-visit labs.  Glade Lloyd, MD Phone: [phone]  Fax: [phone]  -  This note was partially dictated with voice recognition software. Similar sounding words can be  transcribed inadequately or may not  be corrected upon review.  04/08/2017, 10:57 AM

## 2017-04-23 ENCOUNTER — Other Ambulatory Visit: Payer: Self-pay | Admitting: Family Medicine

## 2017-04-29 ENCOUNTER — Encounter: Payer: Medicare Other | Attending: Family Medicine | Admitting: Nutrition

## 2017-04-29 VITALS — Wt 213.0 lb

## 2017-04-29 DIAGNOSIS — E1165 Type 2 diabetes mellitus with hyperglycemia: Secondary | ICD-10-CM

## 2017-04-29 DIAGNOSIS — E1122 Type 2 diabetes mellitus with diabetic chronic kidney disease: Secondary | ICD-10-CM | POA: Insufficient documentation

## 2017-04-29 DIAGNOSIS — N183 Chronic kidney disease, stage 3 (moderate): Secondary | ICD-10-CM | POA: Diagnosis not present

## 2017-04-29 DIAGNOSIS — Z794 Long term (current) use of insulin: Secondary | ICD-10-CM | POA: Diagnosis not present

## 2017-04-29 DIAGNOSIS — Z713 Dietary counseling and surveillance: Secondary | ICD-10-CM | POA: Diagnosis not present

## 2017-04-29 DIAGNOSIS — E669 Obesity, unspecified: Secondary | ICD-10-CM

## 2017-04-29 DIAGNOSIS — IMO0002 Reserved for concepts with insufficient information to code with codable children: Secondary | ICD-10-CM

## 2017-04-29 DIAGNOSIS — E118 Type 2 diabetes mellitus with unspecified complications: Secondary | ICD-10-CM

## 2017-04-29 NOTE — Patient Instructions (Addendum)
Goals Increase low carb vegetabls with lunch and supper Increase water to 5 bottles a day Eat fruit with a meals in stead of snacks. Add eggs with breakfast and reduce the amount of honey bunches of oats.

## 2017-04-29 NOTE — Progress Notes (Signed)
  Medical Nutrition Therapy:  Appt start time: 1330 end time:  1400  Assessment:  Primary concerns today: Diabetes Type 2, Stg 3 CKD  43 units before breakfast and 40 before supper. Metformin 1000 mg BID  Having a hard time with knee pain today. Just bought Honey Bunches of Oats and though they were a good cereal. Wt up 2 lbs.  Still sees Dr. Dorris Fetch, Endocrinology for her thyroid but Dr. Moshe Cipro is caring for her DM. Baking her foods now.  Trying to cut out sodas. Trying to eat more salads. Still having some processed foods but less overall. BS are better in the 130-150's in am. Limited activity due to knee pain. Still tries to go to [facility] for exercises.  Engaged and wants to make changes with diet and exercise to improve her health and reduce complications.  Lab Results  Component Value Date   HGBA1C 8.1 (H) 02/11/2017   Preferred Learning Style:  No preference indicated   Learning Readiness:  Ready  Change in progress   MEDICATIONS: See list   DIETARY INTAKE:  24-hr recall:  B ( AM): Honey bunches of oats with almonfs, milk or oatmeal or grits. Or cheerios. This am:   L ( PM):  Corn dog with bun  And water Snk ( PM): apple or fruit or saasd D ( PM): Cabbage, [place] and corn bread, water Snk ( PM):  Beverages: water  Usual physical activity: [employer]  Estimated energy needs: 1500  calories 170  g carbohydrates 112 g protein 42 g fat  Progress Towards Goal(s):  In progress.   Nutritional Diagnosis:  NB-1.1 Food and nutrition-related knowledge deficit As related to Diabetes Type 2.  As evidenced by A1C 8.1%.    Intervention:  Nutrition and Diabetes education provided on My Plate, CHO counting, meal planning, portion sizes, timing of meals, avoiding snacks between meals unless having a low blood sugar, target ranges for A1C and blood sugars, signs/symptoms and treatment of hyper/hypoglycemia, monitoring blood sugars, taking medications as prescribed, benefits of  exercising 30 minutes per day and prevention of complications of DM. [place] KitchenGoals Increase low carb vegetabls with lunch and supper Increase water to 5 bottles a day Eat fruit with a meals instead of snacks. Add eggs with breakfast and reduce the amount of honey bunches of oast   Teaching Method Utilized:  Visual Auditory Hands on  Handouts given during visit include:  The Plate Method   Meal Plan Card  Diabetes Instructions.   Barriers to learning/adherence to lifestyle change: none  Demonstrated degree of understanding via:  Teach Back   Monitoring/Evaluation:  Dietary intake, exercise, meal planning, SBG, and body weight in 3 months.

## 2017-05-03 ENCOUNTER — Encounter: Payer: Self-pay | Admitting: Nutrition

## 2017-05-05 ENCOUNTER — Other Ambulatory Visit: Payer: Self-pay | Admitting: Family Medicine

## 2017-05-05 DIAGNOSIS — Z1231 Encounter for screening mammogram for malignant neoplasm of breast: Secondary | ICD-10-CM

## 2017-05-27 DIAGNOSIS — Z794 Long term (current) use of insulin: Secondary | ICD-10-CM | POA: Diagnosis not present

## 2017-05-27 DIAGNOSIS — E119 Type 2 diabetes mellitus without complications: Secondary | ICD-10-CM | POA: Diagnosis not present

## 2017-05-28 LAB — BMP8+EGFR
BUN / CREAT RATIO: 16 (ref 12–28)
BUN: 15 mg/dL (ref 8–27)
CHLORIDE: 100 mmol/L (ref 96–106)
CO2: 22 mmol/L (ref 20–29)
Calcium: 9.7 mg/dL (ref 8.7–10.3)
Creatinine, Ser: 0.92 mg/dL (ref 0.57–1.00)
GFR calc Af Amer: 72 mL/min/{1.73_m2} (ref >59)
GFR calc non Af Amer: 63 mL/min/{1.73_m2} (ref >59)
GLUCOSE: 145 mg/dL — AB (ref 65–99)
Potassium: 4.2 mmol/L (ref 3.5–5.2)
SODIUM: 139 mmol/L (ref 134–144)

## 2017-05-28 LAB — HEMOGLOBIN A1C
ESTIMATED AVERAGE GLUCOSE: 148 mg/dL
Hgb A1c MFr Bld: 6.8 % — ABNORMAL HIGH (ref 4.8–5.6)

## 2017-05-31 ENCOUNTER — Encounter: Payer: Self-pay | Admitting: Family Medicine

## 2017-05-31 ENCOUNTER — Ambulatory Visit (INDEPENDENT_AMBULATORY_CARE_PROVIDER_SITE_OTHER): Payer: Medicare Other | Admitting: Family Medicine

## 2017-05-31 ENCOUNTER — Other Ambulatory Visit: Payer: Self-pay | Admitting: Family Medicine

## 2017-05-31 VITALS — BP 138/80 | HR 84 | Resp 16 | Ht 69.0 in | Wt 213.0 lb

## 2017-05-31 DIAGNOSIS — E785 Hyperlipidemia, unspecified: Secondary | ICD-10-CM

## 2017-05-31 DIAGNOSIS — E119 Type 2 diabetes mellitus without complications: Secondary | ICD-10-CM | POA: Diagnosis not present

## 2017-05-31 DIAGNOSIS — E890 Postprocedural hypothyroidism: Secondary | ICD-10-CM

## 2017-05-31 DIAGNOSIS — Z794 Long term (current) use of insulin: Secondary | ICD-10-CM

## 2017-05-31 DIAGNOSIS — I10 Essential (primary) hypertension: Secondary | ICD-10-CM | POA: Diagnosis not present

## 2017-05-31 DIAGNOSIS — M170 Bilateral primary osteoarthritis of knee: Secondary | ICD-10-CM | POA: Diagnosis not present

## 2017-05-31 DIAGNOSIS — E559 Vitamin D deficiency, unspecified: Secondary | ICD-10-CM

## 2017-05-31 DIAGNOSIS — M5441 Lumbago with sciatica, right side: Secondary | ICD-10-CM | POA: Diagnosis not present

## 2017-05-31 DIAGNOSIS — G8929 Other chronic pain: Secondary | ICD-10-CM | POA: Diagnosis not present

## 2017-05-31 DIAGNOSIS — IMO0001 Reserved for inherently not codable concepts without codable children: Secondary | ICD-10-CM

## 2017-05-31 DIAGNOSIS — M5442 Lumbago with sciatica, left side: Secondary | ICD-10-CM | POA: Diagnosis not present

## 2017-05-31 MED ORDER — GABAPENTIN 100 MG PO CAPS: 100.0000 mg | ORAL_CAPSULE | Freq: Every day | ORAL | 5 refills | Status: DC

## 2017-05-31 NOTE — Assessment & Plan Note (Signed)
Bilateral knee pain left worse than right , uncontrolled pain and instability, has never fallen, requests eval by dr Alvan Dame

## 2017-05-31 NOTE — Addendum Note (Signed)
Addended by: Tula Nakayama E on: 05/31/2017 02:08 PM   Modules accepted: Level of Service

## 2017-05-31 NOTE — Patient Instructions (Addendum)
Annual physical exam September 5 or after, call if you need me  Before  You are referred to Dr Noralee Chars as requested , about your knees  New for back pain is gabapentin one at bedtime  CONGRATS on EXCELLENT blood sugar, keep it up  Fasting hBA1C, cmp and eGFR, lipids, cbc and vit D  In 3.5 months  Please be careful not to fall!  Thank you  for choosing [facility]. We consider it a privelige to serve you.  Delivering excellent health care in a caring and  compassionate way is our goal.  Partnering with you,  so that together we can achieve this goal is our strategy.

## 2017-06-01 NOTE — Assessment & Plan Note (Signed)
Controlled and managed by endo 

## 2017-06-01 NOTE — Assessment & Plan Note (Signed)
Controlled, no change in medication DASH diet and commitment to daily physical activity for a minimum of 30 minutes discussed and encouraged, as a part of hypertension management. The importance of attaining a healthy weight is also discussed.  BP/Weight 05/31/2017 04/29/2017 04/08/2017 03/23/2017 02/25/2017 02/15/2017 84/03/6604  Systolic BP 301 - - - 601 093 235  Diastolic BP 80 - - - 78 72 78  Wt. (Lbs) 213 213 211 216 219 216 215.6  BMI 31.45 31.45 31.16 31.9 32.34 31.9 31.84

## 2017-06-01 NOTE — Assessment & Plan Note (Signed)
Markedly improved and at goal Stacey Hanson is reminded of the importance of commitment to daily physical activity for 30 minutes or more, as able and the need to limit carbohydrate intake to 30 to 60 grams per meal to help with blood sugar control.   The need to take medication as prescribed, test blood sugar as directed, and to call between visits if there is a concern that blood sugar is uncontrolled is also discussed.   Stacey Hanson is reminded of the importance of daily foot exam, annual eye examination, and good blood sugar, blood pressure and cholesterol control.  Diabetic Labs Latest Ref Rng & Units 05/27/2017 02/11/2017 10/29/2016 06/19/2016 03/12/2016  HbA1c 4.8 - 5.6 % 6.8(H) 8.1(H) 8.2(H) 7.0(H) 6.2(H)  Microalbumin Not estab mg/dL - - - - 0.4  Micro/Creat Ratio <30 mcg/mg creat - - - - 4  Chol 100 - 199 mg/dL - 171 - 176 -  HDL >39 mg/dL - 64 - 70 -  Calc LDL 0 - 99 mg/dL - 91 - 91 -  Triglycerides 0 - 149 mg/dL - 78 - 77 -  Creatinine 0.57 - 1.00 mg/dL 0.92 0.98 0.91 0.89 0.96(H)   BP/Weight 05/31/2017 04/29/2017 04/08/2017 03/23/2017 02/25/2017 02/15/2017 92/10/5745  Systolic BP 340 - - - 370 964 383  Diastolic BP 80 - - - 78 72 78  Wt. (Lbs) 213 213 211 216 219 216 215.6  BMI 31.45 31.45 31.16 31.9 32.34 31.9 31.84   Foot/eye exam completion dates Latest Ref Rng & Units 11/03/2016 10/03/2015  Eye Exam No Retinopathy - -  Foot exam Order - - -  Foot Form Completion - Done Done

## 2017-06-01 NOTE — Progress Notes (Signed)
Stacey Hanson     MRN: [id number]      DOB: 1945-11-24   HPI Stacey Hanson is here for follow up and re-evaluation of chronic medical conditions, medication management and review of any available recent lab and radiology data.  Preventive health is updated, specifically  Cancer screening and Immunization.   Questions or concerns regarding consultations or procedures which the PT has had in the interim are  addressed. The PT denies any adverse reactions to current medications since the last visit.  Denies polyuria, polydipsia, blurred vision , or hypoglycemic episodes. C/o bilateral knee pain , stiffness and instability left worse than right, progressive , wants surgery and has specific ortho she wants to see, also c.o low back pain which even disturbs her rest , radiates to buttock, no lower extremity weakness or numbness or incontinence Denies polyuria, polydipsia, blurred vision , or hypoglycemic episodes. States she has benefited a lot form diabetic educator and indeed her HBa1C is markedly better  ROS Denies recent fever or chills. Denies sinus pressure, nasal congestion, ear pain or sore throat. Denies chest congestion, productive cough or wheezing. Denies chest pains, palpitations and leg swelling Denies abdominal pain, nausea, vomiting,diarrhea or constipation.   Denies dysuria, frequency, hesitancy or incontinence. Denies skin break down or rash.   PE  BP 138/80   Pulse 84   Resp 16   Ht 5\' 9"  (1.753 m)   Wt 213 lb (96.6 kg)   SpO2 97%   BMI 31.45 kg/m   Patient alert and oriented and in no cardiopulmonary distress.  HEENT: No facial asymmetry, EOMI,   oropharynx pink and moist.  Neck supple no JVD, no mass.  Chest: Clear to auscultation bilaterally.  CVS: S1, S2 no murmurs, no S3.Regular rate.  ABD: Soft non tender.   Ext: No edema  MS: Decreased ROM lumbar spine and both knees with deformity and crepitus in left knee  Skin: Intact, no ulcerations or rash  noted.  Psych: Good eye contact, normal affect. Memory intact not anxious or depressed appearing.  CNS: CN 2-12 intact, power,  normal throughout.no focal deficits noted.   Assessment & Plan  DEGENERATIVE JOINT DISEASE, KNEES, BILATERAL Bilateral knee pain left worse than right , uncontrolled pain and instability, has never fallen, requests eval by dr Alvan Dame  Diabetes mellitus, insulin dependent (IDDM), controlled ([place]) Markedly improved and at goal Stacey Hanson is reminded of the importance of commitment to daily physical activity for 30 minutes or more, as able and the need to limit carbohydrate intake to 30 to 60 grams per meal to help with blood sugar control.   The need to take medication as prescribed, test blood sugar as directed, and to call between visits if there is a concern that blood sugar is uncontrolled is also discussed.   Stacey Hanson is reminded of the importance of daily foot exam, annual eye examination, and good blood sugar, blood pressure and cholesterol control.  Diabetic Labs Latest Ref Rng & Units 05/27/2017 02/11/2017 10/29/2016 06/19/2016 03/12/2016  HbA1c 4.8 - 5.6 % 6.8(H) 8.1(H) 8.2(H) 7.0(H) 6.2(H)  Microalbumin Not estab mg/dL - - - - 0.4  Micro/Creat Ratio <30 mcg/mg creat - - - - 4  Chol 100 - 199 mg/dL - 171 - 176 -  HDL >39 mg/dL - 64 - 70 -  Calc LDL 0 - 99 mg/dL - 91 - 91 -  Triglycerides 0 - 149 mg/dL - 78 - 77 -  Creatinine 0.57 - 1.00 mg/dL 0.92  0.98 0.91 0.89 0.96(H)   BP/Weight 05/31/2017 04/29/2017 04/08/2017 03/23/2017 02/25/2017 02/15/2017 73/04/2023  Systolic BP 427 - - - 062 376 283  Diastolic BP 80 - - - 78 72 78  Wt. (Lbs) 213 213 211 216 219 216 215.6  BMI 31.45 31.45 31.16 31.9 32.34 31.9 31.84   Foot/eye exam completion dates Latest Ref Rng & Units 11/03/2016 10/03/2015  Eye Exam No Retinopathy - -  Foot exam Order - - -  Foot Form Completion - Done Done        ESSENTIAL HYPERTENSION, BENIGN Controlled, no change in medication DASH  diet and commitment to daily physical activity for a minimum of 30 minutes discussed and encouraged, as a part of hypertension management. The importance of attaining a healthy weight is also discussed.  BP/Weight 05/31/2017 04/29/2017 04/08/2017 03/23/2017 02/25/2017 02/15/2017 15/03/7614  Systolic BP 073 - - - 710 626 948  Diastolic BP 80 - - - 78 72 78  Wt. (Lbs) 213 213 211 216 219 216 215.6  BMI 31.45 31.45 31.16 31.9 32.34 31.9 31.84       Hypothyroidism, postradioiodine therapy Controlled and managed by endo  Hyperlipidemia Hyperlipidemia:Low fat diet discussed and encouraged.   Lipid Panel  Lab Results  Component Value Date   CHOL 171 02/11/2017   HDL 64 02/11/2017   LDLCALC 91 02/11/2017   TRIG 78 02/11/2017   CHOLHDL 2.7 02/11/2017   Updated lab needed at/ before next visit. Controlled, no change in medication   '  Back pain Chronic and uncontrolled , start low dose , bed time gabapentin 100 mg if tolerated

## 2017-06-01 NOTE — Assessment & Plan Note (Signed)
Hyperlipidemia:Low fat diet discussed and encouraged.   Lipid Panel  Lab Results  Component Value Date   CHOL 171 02/11/2017   HDL 64 02/11/2017   LDLCALC 91 02/11/2017   TRIG 78 02/11/2017   CHOLHDL 2.7 02/11/2017   Updated lab needed at/ before next visit. Controlled, no change in medication   '

## 2017-06-01 NOTE — Assessment & Plan Note (Signed)
Chronic and uncontrolled , start low dose , bed time gabapentin 100 mg if tolerated

## 2017-06-10 ENCOUNTER — Other Ambulatory Visit: Payer: Self-pay | Admitting: Family Medicine

## 2017-06-29 ENCOUNTER — Other Ambulatory Visit: Payer: Self-pay | Admitting: Family Medicine

## 2017-07-05 ENCOUNTER — Other Ambulatory Visit: Payer: Self-pay | Admitting: Family Medicine

## 2017-07-07 ENCOUNTER — Encounter: Payer: Self-pay | Admitting: Gastroenterology

## 2017-07-07 ENCOUNTER — Ambulatory Visit: Payer: Medicare Other | Admitting: Gastroenterology

## 2017-07-07 ENCOUNTER — Other Ambulatory Visit: Payer: Self-pay | Admitting: *Deleted

## 2017-07-07 ENCOUNTER — Encounter: Payer: Self-pay | Admitting: *Deleted

## 2017-07-07 VITALS — BP 128/78 | HR 94 | Temp 99.0°F | Ht 69.0 in | Wt 210.6 lb

## 2017-07-07 DIAGNOSIS — Z1211 Encounter for screening for malignant neoplasm of colon: Secondary | ICD-10-CM | POA: Diagnosis not present

## 2017-07-07 DIAGNOSIS — K219 Gastro-esophageal reflux disease without esophagitis: Secondary | ICD-10-CM | POA: Diagnosis not present

## 2017-07-07 MED ORDER — CLENPIQ 10-3.5-12 MG-GM -GM/160ML PO SOLN: 1.0000 | Freq: Once | ORAL | 0 refills | Status: AC

## 2017-07-07 NOTE — Progress Notes (Signed)
    Primary Care Physician: Simpson, Margaret E, MD  Primary Gastroenterologist:  Sandi Fields, MD   Chief Complaint  Patient presents with  . Gastroesophageal Reflux    doing ok    HPI: Stacey Hanson is a 72 y.o. female here for follow-up of GERD and to schedule her screening colonoscopy.  Last colonoscopy was in May 2009, she had occasional diverticula, small internal hemorrhoids.  Heartburn is well controlled on omeprazole.  Denies significant dysphagia.  No vomiting.  Bowel movements are regular.  No abdominal pain.  No blood in the stool or melena.  She is working towards having bilateral knee replacements.  Shee wants to go ahead and get her colonoscopy done.  Current Outpatient Medications  Medication Sig Dispense Refill  . ASPIRIN LOW DOSE 81 MG EC tablet TAKE 1 TABLET(81 MG) BY MOUTH DAILY 100 tablet 3  . B-D UF III MINI PEN NEEDLES 31G X 5 MM MISC USE FOR TWICE DAILY INJECTIONS OF HUMALOG 100 each 0  . benazepril (LOTENSIN) 40 MG tablet TAKE 1 TABLET(40 MG) BY MOUTH DAILY 90 tablet 1  . Calcium Carbonate-Vitamin D (CALCIUM PLUS VITAMIN D PO) Take by mouth. Take two tablets daily     . gabapentin (NEURONTIN) 100 MG capsule Take 1 capsule (100 mg total) by mouth at bedtime. 30 capsule 5  . glucose blood (ONE TOUCH ULTRA TEST) test strip USE AS DIRECTED TWICE DAILY dx e11.9 100 each 5  . HUMALOG MIX 75/25 KWIKPEN (75-25) 100 UNIT/ML Kwikpen INJECT 40 UNITS INTO THE SKIN TWICE DAILY 30 mL 2  . hydrochlorothiazide (HYDRODIURIL) 25 MG tablet TAKE 1 TABLET(25 MG) BY MOUTH DAILY 90 tablet 3  . levothyroxine (SYNTHROID, LEVOTHROID) 88 MCG tablet TAKE 1 TABLET BY MOUTH EVERY DAY IN THE MORNING 30 tablet 12  . metFORMIN (GLUCOPHAGE) 1000 MG tablet TAKE 1 TABLET(1000 MG) BY MOUTH TWICE DAILY WITH A MEAL 180 tablet 0  . Multiple Vitamin (MULTIVITAMIN) capsule Take 1 capsule by mouth daily.      . olopatadine (PATANOL) 0.1 % ophthalmic solution Place 1 drop into both eyes 2 (two) times  daily. (Patient taking differently: Place 1 drop into both eyes as needed. ) 5 mL 3  . omeprazole (PRILOSEC) 20 MG capsule TAKE 1 CAPSULE BY MOUTH EVERY MORNING 30 capsule 11  . ONETOUCH DELICA LANCETS 33G MISC USE  TO CHECK GLUCOSE TWICE DAILY AS DIRECTED dx e11.9 100 each 5  . pravastatin (PRAVACHOL) 40 MG tablet TAKE 1 TABLET(40 MG) BY MOUTH DAILY 90 tablet 1  . pravastatin (PRAVACHOL) 40 MG tablet TAKE 1 TABLET(40 MG) BY MOUTH DAILY 90 tablet 0  . UNABLE TO FIND Diabetic shoes 1 each 0   No current facility-administered medications for this visit.     Allergies as of 07/07/2017 - Review Complete 07/07/2017  Allergen Reaction Noted  . Simvastatin  05/01/2010   Past Medical History:  Diagnosis Date  . BMI 30.0-30.9,adult 2012 215 LBS   2004 198 LBS  . Diabetes mellitus   . 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 H. pylori infection 09/24/2010  . Hyperlipidemia   . Hypertension   . IDDM (insulin dependent diabetes mellitus) ([facility]) 1987  . Knee pain   . Obesity    Past Surgical History:  Procedure Laterality Date  . ABDOMINAL HYSTERECTOMY  1970  . CHOLECYSTECTOMY  OCT 2010 BZ BILIARY DYSKINESIA   CHRONIC CHOLECYSTITIS  . COLONOSCOPY  MAY 2009 SCREENING   pTC   TICS, SML IH  . ESOPHAGOGASTRODUODENOSCOPY  10/03/2010   Procedure: ESOPHAGOGASTRODUODENOSCOPY (EGD);  Surgeon: Dorothyann Peng, MD;  Location: [facility];  Service: Endoscopy;  Laterality: N/A;  . ESOPHAGOGASTRODUODENOSCOPY  12/19/2002   RMR: Normal esophagus/Normal stomach/ Duodenum large bulbar diverticulum, 1 cm bulbar ulcer with surrounding/ inflammation as described above. Normal D2  . SAVORY DILATION  10/03/2010   Procedure: SAVORY DILATION;  Surgeon: Dorothyann Peng, MD;  Location: [facility];  Service: Endoscopy;;  . TOTAL ABDOMINAL HYSTERECTOMY  1970  . UPPER GASTROINTESTINAL ENDOSCOPY  OCT 2004 RMR   DUO TIC, & ULCER   Family History  Problem Relation Age of Onset  . Diabetes Mother   .  Hypertension Mother   . Heart failure Mother   . Cancer Father 92       prostate and colon  . Diabetes Sister   . Hypertension Sister   . Hypertension Sister   . Diabetes Sister   . Diabetes Brother   . Hypertension Brother   . Diabetes Brother   . Hypertension Brother   . Coronary artery disease Brother 35       MI  . Colon cancer Neg Hx   . Colon polyps Neg Hx    Social History   Tobacco Use  . Smoking status: Former Smoker    Types: Cigarettes    Last attempt to quit: 02/20/1985    Years since quitting: 32.3  . Smokeless tobacco: Never Used  Substance Use Topics  . Alcohol use: No  . Drug use: No    ROS:  General: Negative for anorexia, weight loss, fever, chills, fatigue, weakness. ENT: Negative for hoarseness, difficulty swallowing , nasal congestion. CV: Negative for chest pain, angina, palpitations, dyspnea on exertion, peripheral edema.  Respiratory: Negative for dyspnea at rest, dyspnea on exertion, cough, sputum, wheezing.  GI: See history of present illness. GU:  Negative for dysuria, hematuria, urinary incontinence, urinary frequency, nocturnal urination.  Endo: Negative for unusual weight change.    Physical Examination:   BP 128/78   Pulse 94   Temp 99 F (37.2 C) (Oral)   Ht 5' 9" (1.753 m)   Wt 210 lb 9.6 oz (95.5 kg)   BMI 31.10 kg/m   General: Well-nourished, well-developed in no acute distress.  Eyes: No icterus. Mouth: Oropharyngeal mucosa moist and pink. Lungs: Clear to auscultation bilaterally.  Heart: Regular rate and rhythm, no murmurs rubs or gallops.  Abdomen: Bowel sounds are normal, nontender, nondistended, no hepatosplenomegaly or masses, no abdominal bruits or hernia , no rebound or guarding.   Extremities: No lower extremity edema. No clubbing or deformities. Neuro: Alert and oriented x 4   Skin: Warm and dry, no jaundice.   Psych: Alert and cooperative, normal mood and affect.  Labs:  Lab Results  Component Value Date    CREATININE 0.92 05/27/2017   BUN 15 05/27/2017   NA 139 05/27/2017   K 4.2 05/27/2017   CL 100 05/27/2017   CO2 22 05/27/2017   Lab Results  Component Value Date   ALT 20 02/11/2017   AST 27 02/11/2017   ALKPHOS 89 02/11/2017   BILITOT 0.3 02/11/2017    Lab Results  Component Value Date   HGBA1C 6.8 (H) 05/27/2017      Imaging Studies: No results found.

## 2017-07-07 NOTE — Patient Instructions (Signed)
1. Colonoscopy in the near future.  Please see separate instructions.   2. Continue omeprazole 20 mg daily. 3. Return to the office in 1 year or sooner if needed.

## 2017-07-07 NOTE — Assessment & Plan Note (Signed)
Due for screening colonoscopy.  I have discussed the risks, alternatives, benefits with regards to but not limited to the risk of reaction to medication, bleeding, infection, perforation and the patient is agreeable to proceed. Written consent to be obtained.

## 2017-07-07 NOTE — Assessment & Plan Note (Signed)
Doing very well on current regimen.  Reinforced antireflux measures.  Return to the office in 1 year or sooner if needed.

## 2017-07-07 NOTE — Progress Notes (Signed)
CC'ED TO PCP 

## 2017-07-14 ENCOUNTER — Ambulatory Visit (HOSPITAL_COMMUNITY): Payer: Medicare Other

## 2017-07-14 DIAGNOSIS — M1712 Unilateral primary osteoarthritis, left knee: Secondary | ICD-10-CM | POA: Diagnosis not present

## 2017-07-14 DIAGNOSIS — M25562 Pain in left knee: Secondary | ICD-10-CM | POA: Diagnosis not present

## 2017-07-14 DIAGNOSIS — M25561 Pain in right knee: Secondary | ICD-10-CM | POA: Diagnosis not present

## 2017-07-21 ENCOUNTER — Ambulatory Visit (HOSPITAL_COMMUNITY)
Admission: RE | Admit: 2017-07-21 | Discharge: 2017-07-21 | Disposition: A | Discharge status: Home / Self Care | Payer: Medicare Other | Source: Ambulatory Visit | Attending: Family Medicine | Admitting: Family Medicine

## 2017-07-21 DIAGNOSIS — Z1231 Encounter for screening mammogram for malignant neoplasm of breast: Secondary | ICD-10-CM | POA: Diagnosis not present

## 2017-07-31 LAB — GLUCOSE, POCT (MANUAL RESULT ENTRY): POC Glucose: 188 mg/dl — AB (ref 70–99)

## 2017-08-02 ENCOUNTER — Other Ambulatory Visit: Payer: Self-pay | Admitting: Family Medicine

## 2017-08-11 ENCOUNTER — Encounter (HOSPITAL_COMMUNITY)
Admission: RE | Disposition: A | Discharge status: Home / Self Care | Payer: Self-pay | Source: Ambulatory Visit | Attending: Gastroenterology

## 2017-08-11 ENCOUNTER — Encounter (HOSPITAL_COMMUNITY): Payer: Self-pay | Admitting: *Deleted

## 2017-08-11 ENCOUNTER — Other Ambulatory Visit: Payer: Self-pay

## 2017-08-11 ENCOUNTER — Ambulatory Visit (HOSPITAL_COMMUNITY)
Admission: RE | Admit: 2017-08-11 | Discharge: 2017-08-11 | Disposition: A | Discharge status: Home / Self Care | Payer: Medicare Other | Source: Ambulatory Visit | Attending: Gastroenterology | Admitting: Gastroenterology

## 2017-08-11 DIAGNOSIS — Z8249 Family history of ischemic heart disease and other diseases of the circulatory system: Secondary | ICD-10-CM | POA: Diagnosis not present

## 2017-08-11 DIAGNOSIS — Z90710 Acquired absence of both cervix and uterus: Secondary | ICD-10-CM | POA: Insufficient documentation

## 2017-08-11 DIAGNOSIS — K648 Other hemorrhoids: Secondary | ICD-10-CM | POA: Diagnosis not present

## 2017-08-11 DIAGNOSIS — Z9049 Acquired absence of other specified parts of digestive tract: Secondary | ICD-10-CM | POA: Diagnosis not present

## 2017-08-11 DIAGNOSIS — Z87891 Personal history of nicotine dependence: Secondary | ICD-10-CM | POA: Diagnosis not present

## 2017-08-11 DIAGNOSIS — Z833 Family history of diabetes mellitus: Secondary | ICD-10-CM | POA: Insufficient documentation

## 2017-08-11 DIAGNOSIS — M170 Bilateral primary osteoarthritis of knee: Secondary | ICD-10-CM | POA: Diagnosis not present

## 2017-08-11 DIAGNOSIS — Z8042 Family history of malignant neoplasm of prostate: Secondary | ICD-10-CM | POA: Insufficient documentation

## 2017-08-11 DIAGNOSIS — Z800 Family history of malignant neoplasm of digestive organs: Secondary | ICD-10-CM | POA: Diagnosis not present

## 2017-08-11 DIAGNOSIS — E785 Hyperlipidemia, unspecified: Secondary | ICD-10-CM | POA: Insufficient documentation

## 2017-08-11 DIAGNOSIS — E669 Obesity, unspecified: Secondary | ICD-10-CM | POA: Diagnosis not present

## 2017-08-11 DIAGNOSIS — Z79899 Other long term (current) drug therapy: Secondary | ICD-10-CM | POA: Insufficient documentation

## 2017-08-11 DIAGNOSIS — Z1211 Encounter for screening for malignant neoplasm of colon: Secondary | ICD-10-CM | POA: Insufficient documentation

## 2017-08-11 DIAGNOSIS — Z6830 Body mass index (BMI) 30.0-30.9, adult: Secondary | ICD-10-CM | POA: Insufficient documentation

## 2017-08-11 DIAGNOSIS — Q438 Other specified congenital malformations of intestine: Secondary | ICD-10-CM | POA: Diagnosis not present

## 2017-08-11 DIAGNOSIS — K644 Residual hemorrhoidal skin tags: Secondary | ICD-10-CM | POA: Diagnosis not present

## 2017-08-11 DIAGNOSIS — D120 Benign neoplasm of cecum: Secondary | ICD-10-CM | POA: Diagnosis not present

## 2017-08-11 DIAGNOSIS — E039 Hypothyroidism, unspecified: Secondary | ICD-10-CM | POA: Diagnosis not present

## 2017-08-11 DIAGNOSIS — K635 Polyp of colon: Secondary | ICD-10-CM

## 2017-08-11 DIAGNOSIS — Z794 Long term (current) use of insulin: Secondary | ICD-10-CM | POA: Diagnosis not present

## 2017-08-11 DIAGNOSIS — Z7982 Long term (current) use of aspirin: Secondary | ICD-10-CM | POA: Insufficient documentation

## 2017-08-11 DIAGNOSIS — E119 Type 2 diabetes mellitus without complications: Secondary | ICD-10-CM | POA: Insufficient documentation

## 2017-08-11 DIAGNOSIS — I10 Essential (primary) hypertension: Secondary | ICD-10-CM | POA: Insufficient documentation

## 2017-08-11 HISTORY — DX: Hypothyroidism, unspecified: E03.9

## 2017-08-11 HISTORY — PX: POLYPECTOMY: SHX5525

## 2017-08-11 HISTORY — PX: COLONOSCOPY: SHX5424

## 2017-08-11 LAB — GLUCOSE, CAPILLARY: GLUCOSE-CAPILLARY: 186 mg/dL — AB (ref 65–99)

## 2017-08-11 SURGERY — COLONOSCOPY
Anesthesia: Moderate Sedation

## 2017-08-11 MED ORDER — MEPERIDINE HCL 100 MG/ML IJ SOLN
INTRAMUSCULAR | Status: AC
  Filled 2017-08-11: qty 2

## 2017-08-11 MED ORDER — MIDAZOLAM HCL 5 MG/5ML IJ SOLN
INTRAMUSCULAR | Status: AC
  Filled 2017-08-11: qty 10

## 2017-08-11 MED ORDER — STERILE WATER FOR IRRIGATION IR SOLN
Status: DC | PRN
  Administered 2017-08-11: 2.5 mL

## 2017-08-11 MED ORDER — MIDAZOLAM HCL 5 MG/5ML IJ SOLN
INTRAMUSCULAR | Status: DC | PRN
  Administered 2017-08-11: 1 mg via INTRAVENOUS
  Administered 2017-08-11 (×2): 2 mg via INTRAVENOUS

## 2017-08-11 MED ORDER — MEPERIDINE HCL 100 MG/ML IJ SOLN
INTRAMUSCULAR | Status: DC | PRN
  Administered 2017-08-11: 50 mg via INTRAVENOUS
  Administered 2017-08-11: 25 mg via INTRAVENOUS

## 2017-08-11 MED ORDER — SODIUM CHLORIDE 0.9 % IV SOLN
INTRAVENOUS | Status: DC
  Administered 2017-08-11: 1000 mL via INTRAVENOUS

## 2017-08-11 NOTE — Op Note (Signed)
[facility] Patient Name: Stacey Hanson Procedure Date: 08/11/2017 9:26 AM MRN: [id number] Date of Birth: 04-Jul-1945 Attending MD: Barney Drain MD, MD CSN: [id number] Age: 72 Admit Type: Outpatient Procedure:                Colonoscopy with COLD FORCEPS POLYPECTOMY Indications:              Screening for colorectal malignant neoplasm Providers:                Barney Drain MD, MD, Lurline Del, RN, Nelma Rothman,                            Technician Referring MD:             Norwood Levo. Simpson MD, MD Medicines:                Meperidine 75 mg IV, Midazolam 8 mg IV Complications:            No immediate complications. Estimated Blood Loss:     Estimated blood loss was minimal. Procedure:                Pre-Anesthesia Assessment:                           - Prior to the procedure, a History and Physical                            was performed, and patient medications and                            allergies were reviewed. The patient's tolerance of                            previous anesthesia was also reviewed. The risks                            and benefits of the procedure and the sedation                            options and risks were discussed with the patient.                            All questions were answered, and informed consent                            was obtained. Prior Anticoagulants: The patient has                            taken aspirin, last dose was 1 day prior to                            procedure. ASA Grade Assessment: II - A patient                            with mild systemic disease. After reviewing the  risks and benefits, the patient was deemed in                            satisfactory condition to undergo the procedure.                            After obtaining informed consent, the colonoscope                            was passed under direct vision. Throughout the                            procedure, the  patient's blood pressure, pulse, and                            oxygen saturations were monitored continuously. The                            EC-[id number] ([id number]) scope was introduced through                            the anus and advanced to the the cecum, identified                            by appendiceal orifice and ileocecal valve. The                            colonoscopy was somewhat difficult due to a                            tortuous colon. Successful completion of the                            procedure was aided by straightening and shortening                            the scope to obtain bowel loop reduction and                            COLOWRAP. The patient tolerated the procedure well.                            The quality of the bowel preparation was excellent.                            The ileocecal valve, appendiceal orifice, and                            rectum were photographed. Scope In: 9:57:31 AM Scope Out: 10:16:34 AM Scope Withdrawal Time: 0 hours 15 minutes 17 seconds  Total Procedure Duration: 0 hours 19 minutes 3 seconds  Findings:      A 2 mm polyp was found in the cecum. The polyp was sessile. The polyp  was removed with a cold biopsy forceps. Resection and retrieval were       complete.      Internal hemorrhoids were found. The hemorrhoids were moderate.      External hemorrhoids were found. The hemorrhoids were large.      The recto-sigmoid colon, sigmoid colon and descending colon were       moderately redundant. Impression:               - One 2 mm polyp in the cecum, removed with a cold                            biopsy forceps. Resected and retrieved.                           - Internal hemorrhoids.                           - External hemorrhoids.                           - Redundant LEFTcolon. Moderate Sedation:      Moderate (conscious) sedation was administered by the endoscopy nurse       and supervised by the endoscopist. The  following parameters were       monitored: oxygen saturation, heart rate, blood pressure, and response       to care. Total physician intraservice time was 31 minutes. Recommendation:           - High fiber diet.                           - Continue present medications.                           - Await pathology results.                           - Repeat colonoscopy is not recommended due to                            current age (5 years or older) for surveillance.                           - Patient has a contact number available for                            emergencies. The signs and symptoms of potential                            delayed complications were discussed with the                            patient. Return to normal activities tomorrow.                            Written discharge instructions were provided to the  patient. Procedure Code(s):        --- Professional ---                           [phone], Colonoscopy, flexible; with biopsy, single                            or multiple                           G0500, Moderate sedation services provided by the                            same physician or other qualified health care                            professional performing a gastrointestinal                            endoscopic service that sedation supports,                            requiring the presence of an independent trained                            observer to assist in the monitoring of the                            patient's level of consciousness and physiological                            status; initial 15 minutes of intra-service time;                            patient age 72 years or older (additional time may                            be reported with [phone], as appropriate)                           [id number], Moderate sedation services provided by the                            same physician or other qualified  health care                            professional performing the diagnostic or                            therapeutic service that the sedation supports,                            requiring the presence of an independent trained  observer to assist in the monitoring of the                            patient's level of consciousness and physiological                            status; each additional 15 minutes intraservice                            time (List separately in addition to code for                            primary service) Diagnosis Code(s):        --- Professional ---                           Z12.11, Encounter for screening for malignant                            neoplasm of colon                           D12.0, Benign neoplasm of cecum                           K64.4, Residual hemorrhoidal skin tags                           K64.8, Other hemorrhoids                           Q43.8, Other specified congenital malformations of                            intestine CPT copyright 2017 [employer]. All rights reserved. The codes documented in this report are preliminary and upon coder review may  be revised to meet current compliance requirements. Barney Drain, MD Barney Drain MD, MD 08/11/2017 10:23:44 AM This report has been signed electronically. Number of Addenda: 0

## 2017-08-11 NOTE — Discharge Instructions (Signed)
You had 1 small polyp removed. You have MODERATE internal AND LARGE EXETRNAL hemorrhoids.   DRINK WATER TO KEEP YOUR URINE LIGHT YELLOW.  CONTINUE YOUR WEIGHT LOSS EFFORTS.  WHILE I DO NOT WANT TO ALARM YOU, YOUR BODY MASS INDEX IS OVER 30 WHICH MEANS YOU ARE OBESE. OBESITY CAN ACTIVATE CANCER GENES. OBESITY IS ASSOCIATED WITH AN INCREASED RISK FOR CIRRHOSIS AND ALL CANCERS, INCLUDING ESOPHAGEAL AND COLON CANCER. A WEIGHT OF 200 LBS or less  WILL GET YOUR BODY MASS INDEX(BMI) UNDER 30.  FOLLOW A HIGH FIBER DIET. AVOID ITEMS THAT CAUSE BLOATING & GAS. SEE INFO BELOW.  YOUR BIOPSY RESULTS WILL BE AVAILABLE IN 7 days.  Next colonoscopy in 10-15 years if the benefits outweigh the risks.    Colonoscopy Care After Read the instructions outlined below and refer to this sheet in the next week. These discharge instructions provide you with general information on caring for yourself after you leave the hospital. While your treatment has been planned according to the most current medical practices available, unavoidable complications occasionally occur. If you have any problems or questions after discharge, call DR. Nikholas Geffre, [phone].  ACTIVITY  You may resume your regular activity, but move at a slower pace for the next 24 hours.   Take frequent rest periods for the next 24 hours.   Walking will help get rid of the air and reduce the bloated feeling in your belly (abdomen).   No driving for 24 hours (because of the medicine (anesthesia) used during the test).   You may shower.   Do not sign any important legal documents or operate any machinery for 24 hours (because of the anesthesia used during the test).    NUTRITION  Drink plenty of fluids.   You may resume your normal diet as instructed by your doctor.   Begin with a light meal and progress to your normal diet. Heavy or fried foods are harder to digest and may make you feel sick to your stomach (nauseated).   Avoid alcoholic  beverages for 24 hours or as instructed.    MEDICATIONS  You may resume your normal medications.   WHAT YOU CAN EXPECT TODAY  Some feelings of bloating in the abdomen.   Passage of more gas than usual.   Spotting of blood in your stool or on the toilet paper  .  IF YOU HAD POLYPS REMOVED DURING THE COLONOSCOPY:  Eat a soft diet IF YOU HAVE NAUSEA, BLOATING, ABDOMINAL PAIN, OR VOMITING.    FINDING OUT THE RESULTS OF YOUR TEST Not all test results are available during your visit. DR. Oneida Alar WILL CALL YOU WITHIN 14 DAYS OF YOUR PROCEDUE WITH YOUR RESULTS. Do not assume everything is normal if you have not heard from DR. Bryelle Spiewak, CALL HER OFFICE AT [phone].  SEEK IMMEDIATE MEDICAL ATTENTION AND CALL THE OFFICE: [phone] IF:  You have more than a spotting of blood in your stool.   Your belly is swollen (abdominal distention).   You are nauseated or vomiting.   You have a temperature over 101F.   You have abdominal pain or discomfort that is severe or gets worse throughout the day.   High-Fiber Diet A high-fiber diet changes your normal diet to include more whole grains, legumes, fruits, and vegetables. Changes in the diet involve replacing refined carbohydrates with unrefined foods. The calorie level of the diet is essentially unchanged. The Dietary Reference Intake (recommended amount) for adult males is 38 grams per day. For adult females, it  is 25 grams per day. Pregnant and lactating women should consume 28 grams of fiber per day. Fiber is the intact part of a plant that is not broken down during digestion. Functional fiber is fiber that has been isolated from the plant to provide a beneficial effect in the body. PURPOSE  Increase stool bulk.   Ease and regulate bowel movements.   Lower cholesterol.   REDUCE RISK OF COLON CANCER  INDICATIONS THAT YOU NEED MORE FIBER  Constipation and hemorrhoids.   Uncomplicated diverticulosis (intestine condition) and  irritable bowel syndrome.   Weight management.   As a protective measure against hardening of the arteries (atherosclerosis), diabetes, and cancer.   GUIDELINES FOR INCREASING FIBER IN THE DIET  Start adding fiber to the diet slowly. A gradual increase of about 5 more grams (2 slices of whole-wheat bread, 2 servings of most fruits or vegetables, or 1 bowl of high-fiber cereal) per day is best. Too rapid an increase in fiber may result in constipation, flatulence, and bloating.   Drink enough water and fluids to keep your urine clear or pale yellow. Water, juice, or caffeine-free drinks are recommended. Not drinking enough fluid may cause constipation.   Eat a variety of high-fiber foods rather than one type of fiber.   Try to increase your intake of fiber through using high-fiber foods rather than fiber pills or supplements that contain small amounts of fiber.   The goal is to change the types of food eaten. Do not supplement your present diet with high-fiber foods, but replace foods in your present diet.   INCLUDE A VARIETY OF FIBER SOURCES  Replace refined and processed grains with whole grains, canned fruits with fresh fruits, and incorporate other fiber sources. White rice, white breads, and most bakery goods contain little or no fiber.   Brown whole-grain rice, buckwheat oats, and many fruits and vegetables are all good sources of fiber. These include: broccoli, Brussels sprouts, cabbage, cauliflower, beets, sweet potatoes, white potatoes (skin on), carrots, tomatoes, eggplant, squash, berries, fresh fruits, and dried fruits.   Cereals appear to be the richest source of fiber. Cereal fiber is found in whole grains and bran. Bran is the fiber-rich outer coat of cereal grain, which is largely removed in refining. In whole-grain cereals, the bran remains. In breakfast cereals, the largest amount of fiber is found in those with "bran" in their names. The fiber content is sometimes indicated  on the label.   You may need to include additional fruits and vegetables each day.   In baking, for 1 cup white flour, you may use the following substitutions:   1 cup whole-wheat flour minus 2 tablespoons.   1/2 cup white flour plus 1/2 cup whole-wheat flour.   Polyps, Colon  A polyp is extra tissue that grows inside your body. Colon polyps grow in the large intestine. The large intestine, also called the colon, is part of your digestive system. It is a long, hollow tube at the end of your digestive tract where your body makes and stores stool. Most polyps are not dangerous. They are benign. This means they are not cancerous. But over time, some types of polyps can turn into cancer. Polyps that are smaller than a pea are usually not harmful. But larger polyps could someday become or may already be cancerous. To be safe, doctors remove all polyps and test them.   WHO GETS POLYPS? Anyone can get polyps, but certain people are more likely than others. You may  have a greater chance of getting polyps if:  You are over 50.   You have had polyps before.   Someone in your family has had polyps.   Someone in your family has had cancer of the large intestine.   Find out if someone in your family has had polyps. You may also be more likely to get polyps if you:   Eat a lot of fatty foods   Smoke   Drink alcohol   Do not exercise  Eat too much   PREVENTION There is not one sure way to prevent polyps. You might be able to lower your risk of getting them if you:  Eat more fruits and vegetables and less fatty food.   Do not smoke.   Avoid alcohol.   Exercise every day.   Lose weight if you are overweight.   Eating more calcium and folate can also lower your risk of getting polyps. Some foods that are rich in calcium are milk, cheese, and broccoli. Some foods that are rich in folate are chickpeas, kidney beans, and spinach.   Hemorrhoids Hemorrhoids are dilated (enlarged) veins  around the rectum. Sometimes clots will form in the veins. This makes them swollen and painful. These are called thrombosed hemorrhoids. Causes of hemorrhoids include:  Constipation.   Straining to have a bowel movement.   HEAVY LIFTING  HOME CARE INSTRUCTIONS  Eat a well balanced diet and drink 6 to 8 glasses of water every day to avoid constipation. You may also use a bulk laxative.   Avoid straining to have bowel movements.   Keep anal area dry and clean.   Do not use a donut shaped pillow or sit on the toilet for long periods. This increases blood pooling and pain.   Move your bowels when your body has the urge; this will require less straining and will decrease pain and pressure.

## 2017-08-11 NOTE — H&P (Signed)
Primary Care Physician:  Fayrene Helper, MD Primary Gastroenterologist:  Dr. Oneida Alar  Pre-Procedure History & Physical: HPI:  Stacey Hanson is a 72 y.o. female here for [place].  Past Medical History:  Diagnosis Date  . BMI 30.0-30.9,adult 2012 215 LBS   2004 198 LBS  . Diabetes mellitus   . 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 H. pylori infection 09/24/2010  . Hyperlipidemia   . Hypertension   . Hypothyroidism   . IDDM (insulin dependent diabetes mellitus) ([place]) 1987  . Knee pain   . Obesity     Past Surgical History:  Procedure Laterality Date  . ABDOMINAL HYSTERECTOMY  1970  . CHOLECYSTECTOMY  OCT 2010 BZ BILIARY DYSKINESIA   CHRONIC CHOLECYSTITIS  . COLONOSCOPY  MAY 2009 SCREENING   pTC TICS, SML IH  . ESOPHAGOGASTRODUODENOSCOPY  10/03/2010   Procedure: ESOPHAGOGASTRODUODENOSCOPY (EGD);  Surgeon: Dorothyann Peng, MD;  Location: [facility];  Service: Endoscopy;  Laterality: N/A;  . ESOPHAGOGASTRODUODENOSCOPY  12/19/2002   RMR: Normal esophagus/Normal stomach/ Duodenum large bulbar diverticulum, 1 cm bulbar ulcer with surrounding/ inflammation as described above. Normal D2  . SAVORY DILATION  10/03/2010   Procedure: SAVORY DILATION;  Surgeon: Dorothyann Peng, MD;  Location: [facility];  Service: Endoscopy;;  . TOTAL ABDOMINAL HYSTERECTOMY  1970  . UPPER GASTROINTESTINAL ENDOSCOPY  OCT 2004 RMR   DUO TIC, & ULCER    Prior to Admission medications   Medication Sig Start Date End Date Taking? Authorizing Provider  ASPIRIN LOW DOSE 81 MG EC tablet TAKE 1 TABLET(81 MG) BY MOUTH DAILY 01/02/16  Yes Fayrene Helper, MD  benazepril (LOTENSIN) 40 MG tablet TAKE 1 TABLET(40 MG) BY MOUTH DAILY 02/05/17  Yes Fayrene Helper, MD  Calcium Carbonate-Vitamin D (CALCIUM PLUS VITAMIN D PO) Take 2 tablets by mouth daily.    Yes [provider]  gabapentin (NEURONTIN) 100 MG capsule Take 1 capsule (100 mg total) by mouth at bedtime.  05/31/17  Yes Fayrene Helper, MD  HUMALOG MIX 75/25 KWIKPEN (75-25) 100 UNIT/ML Kwikpen INJECT [place] SKIN TWICE DAILY Patient taking differently: INJECT 40 UNITS INTO THE SKIN TWICE DAILY BEFORE MEALS 07/06/17  Yes Fayrene Helper, MD  hydrochlorothiazide (HYDRODIURIL) 25 MG tablet TAKE 1 TABLET(25 MG) BY MOUTH DAILY 08/06/16  Yes Fayrene Helper, MD  levothyroxine (SYNTHROID, LEVOTHROID) 88 MCG tablet TAKE 1 TABLET BY MOUTH EVERY DAY IN THE MORNING Patient taking differently: Take 88 mcg by mouth daily before breakfast.  04/08/17  Yes Nida, Marella Chimes, MD  metFORMIN (GLUCOPHAGE) 1000 MG tablet TAKE 1 TABLET(1000 MG) BY MOUTH TWICE DAILY WITH A MEAL 06/29/17  Yes Fayrene Helper, MD  Multiple Vitamin (MULTIVITAMIN) capsule Take 1 capsule by mouth daily.     Yes [provider]  omeprazole (PRILOSEC) 20 MG capsule TAKE 1 CAPSULE BY MOUTH EVERY MORNING 09/03/16  Yes Mahala Menghini, PA-C  pravastatin (PRAVACHOL) 40 MG tablet TAKE 1 TABLET(40 MG) BY MOUTH DAILY 06/22/16  Yes Fayrene Helper, MD  B-D UF III MINI PEN NEEDLES 31G X 5 MM MISC USE FOR TWICE DAILY INJECTIONS OF HUMALOG 08/03/17   Fayrene Helper, MD  glucose blood (ONE TOUCH ULTRA TEST) test strip USE AS DIRECTED TWICE DAILY dx e11.9 Patient not taking: Reported on 08/03/2017 03/18/17   Fayrene Helper, MD  olopatadine (PATANOL) 0.1 % ophthalmic solution Place 1 drop into both eyes 2 (two)  times daily. Patient taking differently: Place 1 drop into both eyes 2 (two) times daily as needed for allergies.  06/22/16   Fayrene Helper, MD  [facility] DELICA LANCETS 41O MISC USE  TO CHECK GLUCOSE TWICE DAILY AS DIRECTED dx e11.9 Patient not taking: Reported on 08/03/2017 03/18/17   Fayrene Helper, MD  UNABLE TO FIND Diabetic shoes Patient not taking: Reported on 08/03/2017 11/04/16   Fayrene Helper, MD    Allergies as of 07/07/2017 - Review Complete 07/07/2017  Allergen Reaction Noted  . Simvastatin   05/01/2010    Family History  Problem Relation Age of Onset  . Diabetes Mother   . Hypertension Mother   . Heart failure Mother   . Cancer Father 3       prostate and colon  . Diabetes Sister   . Hypertension Sister   . Hypertension Sister   . Diabetes Sister   . Diabetes Brother   . Hypertension Brother   . Diabetes Brother   . Hypertension Brother   . Coronary artery disease Brother 35       MI  . Colon cancer Neg Hx   . Colon polyps Neg Hx     Social History   Socioeconomic History  . Marital status: Single    Spouse name: Not on file  . Number of children: Not on file  . Years of education: Not on file  . Highest education level: Not on file  Occupational History  . Occupation: [employer]  Social Needs  . Financial resource strain: Not on file  . Food insecurity:    Worry: Not on file    Inability: Not on file  . Transportation needs:    Medical: Not on file    Non-medical: Not on file  Tobacco Use  . Smoking status: Former Smoker    Types: Cigarettes    Last attempt to quit: 02/20/1985    Years since quitting: 32.4  . Smokeless tobacco: Never Used  Substance and Sexual Activity  . Alcohol use: No  . Drug use: No  . Sexual activity: Not Currently  Lifestyle  . Physical activity:    Days per week: Not on file    Minutes per session: Not on file  . Stress: Not on file  Relationships  . Social connections:    Talks on phone: Not on file    Gets together: Not on file    Attends religious service: Not on file    Active member of club or organization: Not on file    Attends meetings of clubs or organizations: Not on file    Relationship status: Not on file  . Intimate partner violence:    Fear of current or ex partner: Not on file    Emotionally abused: Not on file    Physically abused: Not on file    Forced sexual activity: Not on file  Other Topics Concern  . Not on file  Social History Narrative  . Not on file    Review of Systems: See HPI,  otherwise negative ROS   Physical Exam: BP (!) 157/74   Pulse 86   Temp 98.3 F (36.8 C) (Oral)   Resp 17   Ht '5\' 9"'  (1.753 m)   Wt 212 lb (96.2 kg)   SpO2 98%   BMI 31.31 kg/m  General:   Alert,  pleasant and cooperative in NAD Head:  Normocephalic and atraumatic. Neck:  Supple; Lungs:  Clear throughout to auscultation.  Heart:  Regular rate and rhythm. Abdomen:  Soft, nontender and nondistended. Normal bowel sounds, without guarding, and without rebound.   Neurologic:  Alert and  oriented x4;  grossly normal neurologically.  Impression/Plan:    SCREENING  Plan:  1. TCS TODAY DISCUSSED PROCEDURE, BENEFITS, & RISKS: < 1% chance of medication reaction, bleeding, perforation, or rupture of spleen/liver.

## 2017-08-16 NOTE — Progress Notes (Signed)
ON RECALL  °

## 2017-08-17 NOTE — Progress Notes (Signed)
Called, many rings and no answer. Mailing a letter to call with results.

## 2017-08-18 ENCOUNTER — Other Ambulatory Visit: Payer: Self-pay | Admitting: Gastroenterology

## 2017-08-18 ENCOUNTER — Encounter: Payer: Medicare Other | Attending: Family Medicine | Admitting: Nutrition

## 2017-08-18 ENCOUNTER — Other Ambulatory Visit: Payer: Self-pay | Admitting: Family Medicine

## 2017-08-18 ENCOUNTER — Encounter: Payer: Self-pay | Admitting: Nutrition

## 2017-08-18 VITALS — Ht 69.0 in | Wt 211.0 lb

## 2017-08-18 DIAGNOSIS — E119 Type 2 diabetes mellitus without complications: Secondary | ICD-10-CM | POA: Diagnosis not present

## 2017-08-18 DIAGNOSIS — Z713 Dietary counseling and surveillance: Secondary | ICD-10-CM | POA: Insufficient documentation

## 2017-08-18 DIAGNOSIS — Z794 Long term (current) use of insulin: Secondary | ICD-10-CM | POA: Diagnosis not present

## 2017-08-18 DIAGNOSIS — E669 Obesity, unspecified: Secondary | ICD-10-CM

## 2017-08-18 NOTE — Progress Notes (Signed)
  Medical Nutrition Therapy:  Appt start time: 110 end time:  1130  Assessment:  Primary concerns today: Diabetes Type 2, A1C 6.8%. Has been going to the [facility] and cutting out the sweets and eating more vegetables and fruit. Cut out frying her food. Lost 3 lbs from last visit. 43 units before breakfast and 40 before supper. Metformin 1000 mg BID    She has been doing fantastic. Hoping to be more active to lose some weight. Hopes to get knee surgery in October 2019.  Lab Results  Component Value Date   HGBA1C 6.8 (H) 05/27/2017   Lipid Panel     Component Value Date/Time   CHOL 171 02/11/2017 1021   TRIG 78 02/11/2017 1021   HDL 64 02/11/2017 1021   CHOLHDL 2.7 02/11/2017 1021   CHOLHDL 2.2 01/11/2016 0916   VLDL 11 01/11/2016 0916   LDLCALC 91 02/11/2017 1021   CMP Latest Ref Rng & Units 05/27/2017 02/11/2017 10/29/2016  Glucose 65 - 99 mg/dL 145(H) 207(H) 96  BUN 8 - 27 mg/dL 15 19 14   Creatinine 0.57 - 1.00 mg/dL 0.92 0.98 0.91  Sodium 134 - 144 mmol/L 139 137 139  Potassium 3.5 - 5.2 mmol/L 4.2 4.5 4.6  Chloride 96 - 106 mmol/L 100 95(L) 99  CO2 20 - 29 mmol/L 22 24 20   Calcium 8.7 - 10.3 mg/dL 9.7 9.7 9.5  Total Protein 6.0 - 8.5 g/dL - 7.6 7.5  Total Bilirubin 0.0 - 1.2 mg/dL - 0.3 0.3  Alkaline Phos 39 - 117 IU/L - 89 91  AST 0 - 40 IU/L - 27 27  ALT 0 - 32 IU/L - 20 22     Preferred Learning Style:  No preference indicated   Learning Readiness:  Ready  Change in progress   MEDICATIONS: See list   DIETARY INTAKE:  24-hr recall:  B ( AM): Honey bunches of oats with almonfs, milk or oatmeal or grits. Or cheerios. This am:   L ( PM):  Corn dog with bun  And water Snk ( PM): apple or fruit or saasd D ( PM): Cabbage, [place] and corn bread, water Snk ( PM):  Beverages: water  Usual physical activity: [employer]  Estimated energy needs: 1500  calories 170  g carbohydrates 112 g protein 42 g fat  Progress Towards Goal(s):  In progress.   Nutritional  Diagnosis:  NB-1.1 Food and nutrition-related knowledge deficit As related to Diabetes Type 2.  As evidenced by A1C 8.1%.    Intervention:  Nutrition and Diabetes education provided on My Plate, CHO counting, meal planning, portion sizes, timing of meals, avoiding snacks between meals unless having a low blood sugar, target ranges for A1C and blood sugars, signs/symptoms and treatment of hyper/hypoglycemia, monitoring blood sugars, taking medications as prescribed, benefits of exercising 30 minutes per day and prevention of complications of DM. Goals 1 Lose 2-3 lbs per month. 2. Keep A1C to 6.8 or less 3. Keep going to [facility] 4. Increase fresh fruits and vegetables. Cut out snacks.    Teaching Method Utilized:  Visual Auditory Hands on  Handouts given during visit include:  The Plate Method   Meal Plan Card  Diabetes Instructions.   Barriers to learning/adherence to lifestyle change: none  Demonstrated degree of understanding via:  Teach Back   Monitoring/Evaluation:  Dietary intake, exercise, meal planning, SBG, and body weight in 6 months.

## 2017-08-18 NOTE — Patient Instructions (Signed)
Goals 1 Lose 2-3 lbs per month. 2. Keep A1C to 6.8 or less 3. Keep going to [facility] 4. Increase fresh fruits and vegetables. Cut out snacks.

## 2017-08-19 ENCOUNTER — Other Ambulatory Visit: Payer: Self-pay | Admitting: Family Medicine

## 2017-08-19 ENCOUNTER — Encounter (HOSPITAL_COMMUNITY): Payer: Self-pay | Admitting: Gastroenterology

## 2017-08-19 ENCOUNTER — Other Ambulatory Visit: Payer: Self-pay | Admitting: Gastroenterology

## 2017-08-23 ENCOUNTER — Telehealth: Payer: Self-pay

## 2017-08-23 NOTE — Telephone Encounter (Addendum)
Pt received a letter and called for results and is aware of results and plan.

## 2017-09-06 ENCOUNTER — Other Ambulatory Visit: Payer: Self-pay | Admitting: Family Medicine

## 2017-09-16 ENCOUNTER — Other Ambulatory Visit: Payer: Self-pay | Admitting: Family Medicine

## 2017-09-17 ENCOUNTER — Other Ambulatory Visit: Payer: Self-pay | Admitting: Family Medicine

## 2017-10-04 DIAGNOSIS — H52223 Regular astigmatism, bilateral: Secondary | ICD-10-CM | POA: Diagnosis not present

## 2017-10-04 DIAGNOSIS — H5212 Myopia, left eye: Secondary | ICD-10-CM | POA: Diagnosis not present

## 2017-10-04 DIAGNOSIS — H25813 Combined forms of age-related cataract, bilateral: Secondary | ICD-10-CM | POA: Diagnosis not present

## 2017-10-04 DIAGNOSIS — H5201 Hypermetropia, right eye: Secondary | ICD-10-CM | POA: Diagnosis not present

## 2017-10-04 LAB — HM DIABETES EYE EXAM

## 2017-10-25 ENCOUNTER — Other Ambulatory Visit: Payer: Self-pay | Admitting: Family Medicine

## 2017-11-06 DIAGNOSIS — Z794 Long term (current) use of insulin: Secondary | ICD-10-CM | POA: Diagnosis not present

## 2017-11-06 DIAGNOSIS — E119 Type 2 diabetes mellitus without complications: Secondary | ICD-10-CM | POA: Diagnosis not present

## 2017-11-06 DIAGNOSIS — E785 Hyperlipidemia, unspecified: Secondary | ICD-10-CM | POA: Diagnosis not present

## 2017-11-06 DIAGNOSIS — E559 Vitamin D deficiency, unspecified: Secondary | ICD-10-CM | POA: Diagnosis not present

## 2017-11-09 ENCOUNTER — Encounter: Payer: Self-pay | Admitting: Family Medicine

## 2017-11-09 ENCOUNTER — Other Ambulatory Visit: Payer: Self-pay

## 2017-11-09 ENCOUNTER — Ambulatory Visit (INDEPENDENT_AMBULATORY_CARE_PROVIDER_SITE_OTHER): Payer: Medicare Other | Admitting: Family Medicine

## 2017-11-09 ENCOUNTER — Other Ambulatory Visit (INDEPENDENT_AMBULATORY_CARE_PROVIDER_SITE_OTHER): Payer: Self-pay | Admitting: Otolaryngology

## 2017-11-09 VITALS — BP 130/80 | HR 102 | Resp 16 | Ht 69.0 in | Wt 213.0 lb

## 2017-11-09 DIAGNOSIS — Z01818 Encounter for other preprocedural examination: Secondary | ICD-10-CM

## 2017-11-09 DIAGNOSIS — E1159 Type 2 diabetes mellitus with other circulatory complications: Secondary | ICD-10-CM

## 2017-11-09 DIAGNOSIS — Z23 Encounter for immunization: Secondary | ICD-10-CM

## 2017-11-09 DIAGNOSIS — Z01810 Encounter for preprocedural cardiovascular examination: Secondary | ICD-10-CM

## 2017-11-09 DIAGNOSIS — Z0000 Encounter for general adult medical examination without abnormal findings: Secondary | ICD-10-CM | POA: Diagnosis not present

## 2017-11-09 DIAGNOSIS — I10 Essential (primary) hypertension: Secondary | ICD-10-CM

## 2017-11-09 DIAGNOSIS — E041 Nontoxic single thyroid nodule: Secondary | ICD-10-CM

## 2017-11-09 LAB — HEMOGLOBIN A1C
Est. average glucose Bld gHb Est-mCnc: 163 mg/dL
HEMOGLOBIN A1C: 7.3 % — AB (ref 4.8–5.6)

## 2017-11-09 LAB — CMP14+EGFR
ALT: 20 IU/L (ref 0–32)
AST: 23 IU/L (ref 0–40)
Albumin/Globulin Ratio: 1.5 (ref 1.2–2.2)
Albumin: 4.6 g/dL (ref 3.5–4.8)
Alkaline Phosphatase: 96 IU/L (ref 39–117)
BILIRUBIN TOTAL: 0.3 mg/dL (ref 0.0–1.2)
BUN/Creatinine Ratio: 17 (ref 12–28)
BUN: 18 mg/dL (ref 8–27)
CHLORIDE: 95 mmol/L — AB (ref 96–106)
CO2: 22 mmol/L (ref 20–29)
Calcium: 10 mg/dL (ref 8.7–10.3)
Creatinine, Ser: 1.06 mg/dL — ABNORMAL HIGH (ref 0.57–1.00)
GFR calc non Af Amer: 53 mL/min/{1.73_m2} — ABNORMAL LOW (ref >59)
GFR, EST AFRICAN AMERICAN: 61 mL/min/{1.73_m2} (ref >59)
GLOBULIN, TOTAL: 3 g/dL (ref 1.5–4.5)
Glucose: 186 mg/dL — ABNORMAL HIGH (ref 65–99)
POTASSIUM: 4 mmol/L (ref 3.5–5.2)
SODIUM: 136 mmol/L (ref 134–144)
Total Protein: 7.6 g/dL (ref 6.0–8.5)

## 2017-11-09 LAB — POCT URINALYSIS DIPSTICK
Bilirubin, UA: NEGATIVE
Glucose, UA: NEGATIVE
Ketones, UA: NEGATIVE
NITRITE UA: NEGATIVE
PROTEIN UA: NEGATIVE
Spec Grav, UA: 1.025 (ref 1.010–1.025)
Urobilinogen, UA: 0.2 E.U./dL
pH, UA: 5.5 (ref 5.0–8.0)

## 2017-11-09 LAB — CBC
Hematocrit: 38.7 % (ref 34.0–46.6)
Hemoglobin: 12.7 g/dL (ref 11.1–15.9)
MCH: 30.5 pg (ref 26.6–33.0)
MCHC: 32.8 g/dL (ref 31.5–35.7)
MCV: 93 fL (ref 79–97)
Platelets: 479 10*3/uL — ABNORMAL HIGH (ref 150–450)
RBC: 4.17 x10E6/uL (ref 3.77–5.28)
RDW: 14.1 % (ref 12.3–15.4)
WBC: 9 10*3/uL (ref 3.4–10.8)

## 2017-11-09 LAB — LIPID PANEL
CHOL/HDL RATIO: 2.7 ratio (ref 0.0–4.4)
Cholesterol, Total: 170 mg/dL (ref 100–199)
HDL: 62 mg/dL (ref >39)
LDL Calculated: 94 mg/dL (ref 0–99)
Triglycerides: 69 mg/dL (ref 0–149)
VLDL CHOLESTEROL CAL: 14 mg/dL (ref 5–40)

## 2017-11-09 LAB — VITAMIN D 25 HYDROXY (VIT D DEFICIENCY, FRACTURES): VIT D 25 HYDROXY: 50.5 ng/mL (ref 30.0–100.0)

## 2017-11-09 NOTE — Patient Instructions (Signed)
F/U I early January, call if you need me before  CXR today  Stop aspirin , calcium and multivitamin 5 days before surgery, left knee arthroplasty  All the best with upcoming surgery  I  will let you know after I review your record more closely if you need to see cardiology   No more sweets and cakes  We will send for your eye exam dose august 5 at my eye doctor in [place] on 8/5  TSH to be added to lab  Flu vaccine today and urine being checked for infetion

## 2017-11-09 NOTE — Progress Notes (Signed)
    Stacey Hanson     MRN: [id number]      DOB: May 24, 1945  HPI: Patient is in for annual physical exam. Here for pre op exam also for left total knee replacement Recent labs, are reviewed.and are good Immunization is reviewed , and  updated   ROS:  Denies fever or chills Resp: Denies nasal congestion, sore throat, cough, sputum or ear pain CVS: denies chest pains, palpitations, PND, orthopnea or leg swelling GI: Denies abdominal pain, nausea, vomiting or diarrhea TM:HDQQIW dysuria, frequency or incontinence MS: positive for joint pain in particular knees with reduced mobility and stiffness, also back pain Skin: denies rash or skin breakdown Psych; denies depression , anxiety or insomnia Denies polyuria, polydipsia, blurred vision , or hypoglycemic episodes.   PE: BP 130/80   Pulse (!) 102   Resp 16   Ht 5\' 9"  (1.753 m)   Wt 213 lb (96.6 kg)   SpO2 98%   BMI 31.45 kg/m   Pleasant  female, alert and oriented x 3, in no cardio-pulmonary distress. Afebrile. HEENT No facial trauma or asymetry. Sinuses non tender.  Extra occullar muscles intact, pupils equally reactive to light. External ears normal, tympanic membranes clear. Oropharynx moist, no exudate. Neck: supple, no adenopathy,JVD or thyromegaly.No bruits.  Chest: Clear to ascultation bilaterally.No crackles or wheezes. Non tender to palpation  Breast: No asymetry,no masses or lumps. No tenderness. No nipple discharge or inversion. No axillary or supraclavicular adenopathy  Cardiovascular system; Heart sounds normal,  S1 and  S2 ,no S3.  No murmur, or thrill. Apical beat not displaced Peripheral pulses normal. LNL:GXQJJ tachycardia rate 101 with  Non specific t wave abnormality Abdomen: Soft, non tender, no organomegaly or masses. No bruits. Bowel sounds normal. No guarding, tenderness or rebound.    GU: No exam indicated  Musculoskeletal exam: Decreased ROM of spine, hips , and knees. Deformity  ,swelling and  crepitus noted.of knees right greater than left No muscle wasting or atrophy.   Neurologic: Cranial nerves 2 to 12 intact. Power, tone ,sensation and reflexes normal throughout. Abnormal gait. No tremor.  Skin: Intact, no ulceration, erythema , scaling or rash noted. Pigmentation normal throughout  Psych; Normal mood and affect. Judgement and concentration normal   Assessment & Plan:  Annual physical exam Annual exam as documented. Counseling done  re healthy lifestyle involving commitment to 150 minutes exercise per week, heart healthy diet, and attaining healthy weight.The importance of adequate sleep also discussed.  Changes in health habits are decided on by the patient with goals and time frames  set for achieving them. Immunization and cancer screening needs are specifically addressed at this visit.   Need for immunization against influenza After obtaining informed consent, the vaccine is  administered by LPN.   Pre-operative clearance History and exam as documented Labs are within normal, except that urine will need to be resubmitted or checked for infection due to traxce leukocyte CXR is clear EKG: non specific t wave abnormality noted, patient has been seen over 1 year ago by cardiology, will still request cardiology reasesment prior to medical clearance Lexiscan report suggested need for echocardiogram,

## 2017-11-10 ENCOUNTER — Telehealth: Payer: Self-pay

## 2017-11-10 ENCOUNTER — Ambulatory Visit (HOSPITAL_COMMUNITY)
Admission: RE | Admit: 2017-11-10 | Discharge: 2017-11-10 | Disposition: A | Discharge status: Home / Self Care | Payer: Medicare Other | Source: Ambulatory Visit | Attending: Family Medicine | Admitting: Family Medicine

## 2017-11-10 ENCOUNTER — Other Ambulatory Visit: Payer: Self-pay | Admitting: Family Medicine

## 2017-11-10 DIAGNOSIS — R05 Cough: Secondary | ICD-10-CM | POA: Diagnosis not present

## 2017-11-10 DIAGNOSIS — R0602 Shortness of breath: Secondary | ICD-10-CM | POA: Diagnosis not present

## 2017-11-10 DIAGNOSIS — E1159 Type 2 diabetes mellitus with other circulatory complications: Secondary | ICD-10-CM | POA: Diagnosis not present

## 2017-11-10 DIAGNOSIS — I10 Essential (primary) hypertension: Secondary | ICD-10-CM | POA: Diagnosis not present

## 2017-11-10 DIAGNOSIS — N3001 Acute cystitis with hematuria: Secondary | ICD-10-CM

## 2017-11-10 DIAGNOSIS — R059 Cough, unspecified: Secondary | ICD-10-CM

## 2017-11-10 DIAGNOSIS — Z01818 Encounter for other preprocedural examination: Secondary | ICD-10-CM | POA: Diagnosis not present

## 2017-11-10 NOTE — Telephone Encounter (Signed)
-----   Message from Fayrene Helper, MD sent at 11/10/2017  1:39 PM EDT ----- Your chest x ray is clear and shows no infection You need to resubmit urine to make sure no  infection

## 2017-11-11 ENCOUNTER — Other Ambulatory Visit (HOSPITAL_COMMUNITY)
Admission: AD | Admit: 2017-11-11 | Discharge: 2017-11-11 | Disposition: A | Discharge status: Home / Self Care | Payer: Medicare Other | Source: Skilled Nursing Facility | Attending: Family Medicine | Admitting: Family Medicine

## 2017-11-11 DIAGNOSIS — N3001 Acute cystitis with hematuria: Secondary | ICD-10-CM | POA: Insufficient documentation

## 2017-11-13 LAB — SPECIMEN STATUS REPORT

## 2017-11-13 LAB — TSH: TSH: 0.566 u[IU]/mL (ref 0.450–4.500)

## 2017-11-14 ENCOUNTER — Encounter: Payer: Self-pay | Admitting: Family Medicine

## 2017-11-14 DIAGNOSIS — Z23 Encounter for immunization: Secondary | ICD-10-CM | POA: Insufficient documentation

## 2017-11-14 DIAGNOSIS — Z01818 Encounter for other preprocedural examination: Secondary | ICD-10-CM | POA: Insufficient documentation

## 2017-11-14 LAB — URINE CULTURE: Culture: >=100000 — AB

## 2017-11-14 NOTE — Assessment & Plan Note (Signed)
After obtaining informed consent, the vaccine is  administered by LPN.  

## 2017-11-14 NOTE — Assessment & Plan Note (Signed)
History and exam as documented Labs are within normal, except that urine will need to be resubmitted or checked for infection due to traxce leukocyte CXR is clear EKG: non specific t wave abnormality noted, patient has been seen over 1 year ago by cardiology, will still request cardiology reasesment prior to medical clearance Lexiscan report suggested need for echocardiogram,

## 2017-11-14 NOTE — Assessment & Plan Note (Signed)
Annual exam as documented. Counseling done  re healthy lifestyle involving commitment to 150 minutes exercise per week, heart healthy diet, and attaining healthy weight.The importance of adequate sleep also discussed. Changes in health habits are decided on by the patient with goals and time frames  set for achieving them. Immunization and cancer screening needs are specifically addressed at this visit. 

## 2017-11-16 ENCOUNTER — Ambulatory Visit (HOSPITAL_COMMUNITY)
Admission: RE | Admit: 2017-11-16 | Discharge: 2017-11-16 | Disposition: A | Discharge status: Home / Self Care | Payer: Medicare Other | Source: Ambulatory Visit | Attending: Otolaryngology | Admitting: Otolaryngology

## 2017-11-16 DIAGNOSIS — E042 Nontoxic multinodular goiter: Secondary | ICD-10-CM | POA: Diagnosis not present

## 2017-11-16 DIAGNOSIS — E049 Nontoxic goiter, unspecified: Secondary | ICD-10-CM | POA: Diagnosis not present

## 2017-11-16 DIAGNOSIS — E041 Nontoxic single thyroid nodule: Secondary | ICD-10-CM | POA: Diagnosis present

## 2017-11-18 ENCOUNTER — Other Ambulatory Visit: Payer: Self-pay | Admitting: Family Medicine

## 2017-11-18 ENCOUNTER — Telehealth: Payer: Self-pay | Admitting: Family Medicine

## 2017-11-18 MED ORDER — CIPROFLOXACIN HCL 500 MG PO TABS: 500.0000 mg | ORAL_TABLET | Freq: Two times a day (BID) | ORAL | 0 refills | Status: DC

## 2017-11-18 NOTE — Progress Notes (Signed)
cipro

## 2017-11-18 NOTE — Telephone Encounter (Signed)
pls call and let her know she has a UTI and I have sent in 5 days of ciprofloxacin toi [facility], take today , drink a lot of water, keep good control of her blood sugar

## 2017-11-18 NOTE — Telephone Encounter (Signed)
I spoke directly to pt

## 2017-11-25 ENCOUNTER — Ambulatory Visit (INDEPENDENT_AMBULATORY_CARE_PROVIDER_SITE_OTHER): Payer: Medicare Other | Admitting: Otolaryngology

## 2017-11-25 DIAGNOSIS — D440 Neoplasm of uncertain behavior of thyroid gland: Secondary | ICD-10-CM | POA: Diagnosis not present

## 2017-12-03 ENCOUNTER — Encounter: Payer: Self-pay | Admitting: Cardiovascular Disease

## 2017-12-03 ENCOUNTER — Ambulatory Visit: Payer: Medicare Other | Admitting: Cardiovascular Disease

## 2017-12-03 VITALS — BP 116/64 | HR 100 | Ht 69.0 in | Wt 211.0 lb

## 2017-12-03 DIAGNOSIS — E7800 Pure hypercholesterolemia, unspecified: Secondary | ICD-10-CM

## 2017-12-03 DIAGNOSIS — Z01818 Encounter for other preprocedural examination: Secondary | ICD-10-CM | POA: Diagnosis not present

## 2017-12-03 DIAGNOSIS — R9431 Abnormal electrocardiogram [ECG] [EKG]: Secondary | ICD-10-CM

## 2017-12-03 DIAGNOSIS — I10 Essential (primary) hypertension: Secondary | ICD-10-CM | POA: Diagnosis not present

## 2017-12-03 NOTE — Patient Instructions (Signed)
Medication Instructions:  NONE If you need a refill on your cardiac medications before your next appointment, please call your pharmacy.   Lab work: NONE If you have labs (blood work) drawn today and your tests are completely normal, you will receive your results only by: Marland Kitchen MyChart Message (if you have MyChart) OR . A paper copy in the mail If you have any lab test that is abnormal or we need to change your treatment, we will call you to review the results.  Testing/Procedures: Your physician has requested that you have an echocardiogram. Echocardiography is a painless test that uses sound waves to create images of your heart. It provides your doctor with information about the size and shape of your heart and how well your heart's chambers and valves are working. This procedure takes approximately one hour. There are no restrictions for this procedure.    Follow-Up: At [facility], you and your health needs are our priority.  As part of our continuing mission to provide you with exceptional heart care, we have created designated Provider Care Teams.  These Care Teams include your primary Cardiologist (physician) and Advanced Practice Providers (APPs -  Physician Assistants and Nurse Practitioners) who all work together to provide you with the care you need, when you need it. . We will call you with results, follow up to be determined      Any Other Special Instructions Will Be Listed Below (If Applicable). NONE         Thank you for choosing [facility] !

## 2017-12-03 NOTE — Progress Notes (Signed)
SUBJECTIVE: The patient presents for preoperative risk stratification.  I last evaluated her in March 2017.  At that time she underwent a nuclear stress test and imaging was of poor quality due to soft tissue attenuation artifact.  There were no gross ischemic territories however and it was a low risk study overall.  Past medical history includes a family history of premature coronary disease, hypertension, diabetes, and hyperlipidemia.  I personally reviewed the ECG performed on 11/09/2017 which showed sinus tachycardia, 102 bpm, with nonspecific T wave abnormalities.  She denies chest pain, leg swelling, orthopnea, palpitations, and paroxysmal nocturnal dyspnea.  She seldom has lightheadedness and seldom has shortness of breath.  She is scheduled to undergo left knee replacement surgery on 12/21/2017 by Dr. Alvan Dame.  I reviewed labs dated 11/06/2017: Normal TSH, normal vitamin D levels, mildly increased platelets 479, HbA1c 7.3%, normal renal function, LDL 94, total cholesterol 170, triglycerides 69, HDL 62.      Review of Systems: As per "subjective", otherwise negative.  Allergies  Allergen Reactions  . Pravastatin Other (See Comments)    PT is currently taking but states she cramps  . Simvastatin Other (See Comments)    Muscle cramps    Current Outpatient Medications  Medication Sig Dispense Refill  . ASPIRIN LOW DOSE 81 MG EC tablet TAKE 1 TABLET(81 MG) BY MOUTH DAILY 100 tablet 3  . B-D UF III MINI PEN NEEDLES 31G X 5 MM MISC USE FOR TWICE DAILY INJECTIONS OF HUMALOG 100 each 6  . benazepril (LOTENSIN) 40 MG tablet TAKE 1 TABLET(40 MG) BY MOUTH DAILY 90 tablet 0  . Calcium Carbonate-Vitamin D (CALCIUM PLUS VITAMIN D PO) Take 2 tablets by mouth daily.     . ciprofloxacin (CIPRO) 500 MG tablet Take 1 tablet (500 mg total) by mouth 2 (two) times daily. 10 tablet 0  . glucose blood (ONE TOUCH ULTRA TEST) test strip USE AS DIRECTED TWICE DAILY dx e11.9 100 each 5  . HUMALOG  MIX 75/25 KWIKPEN (75-25) 100 UNIT/ML Kwikpen INJECT 40 UNITS INTO THE SKIN TWICE DAILY 30 mL 2  . hydrochlorothiazide (HYDRODIURIL) 25 MG tablet TAKE 1 TABLET(25 MG) BY MOUTH DAILY 90 tablet 0  . levothyroxine (SYNTHROID, LEVOTHROID) 88 MCG tablet TAKE 1 TABLET BY MOUTH EVERY DAY IN THE MORNING (Patient taking differently: Take 88 mcg by mouth daily before breakfast. ) 30 tablet 12  . metFORMIN (GLUCOPHAGE) 1000 MG tablet TAKE 1 TABLET(1000 MG) BY MOUTH TWICE DAILY WITH A MEAL 180 tablet 0  . Multiple Vitamin (MULTIVITAMIN) capsule Take 1 capsule by mouth daily.      Marland Kitchen omeprazole (PRILOSEC) 20 MG capsule TAKE 1 CAPSULE BY MOUTH EVERY MORNING 30 capsule 5  . ONETOUCH DELICA LANCETS 63J MISC USE  TO CHECK GLUCOSE TWICE DAILY AS DIRECTED dx e11.9 100 each 5  . pravastatin (PRAVACHOL) 40 MG tablet TAKE 1 TABLET(40 MG) BY MOUTH DAILY 90 tablet 1  . UNABLE TO FIND Diabetic shoes 1 each 0   No current facility-administered medications for this visit.     Past Medical History:  Diagnosis Date  . BMI 30.0-30.9,adult 2012 215 LBS   2004 198 LBS  . Diabetes mellitus   . 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 H. pylori infection 09/24/2010  . Hyperlipidemia   . Hypertension   . Hypothyroidism   . IDDM (insulin dependent diabetes mellitus) ([place]) 1987  . Knee pain   . Obesity  Past Surgical History:  Procedure Laterality Date  . ABDOMINAL HYSTERECTOMY  1970  . CHOLECYSTECTOMY  OCT 2010 BZ BILIARY DYSKINESIA   CHRONIC CHOLECYSTITIS  . COLONOSCOPY  MAY 2009 SCREENING   pTC TICS, SML IH  . COLONOSCOPY N/A 08/11/2017   Procedure: COLONOSCOPY;  Surgeon: Danie Binder, MD;  Location: [facility];  Service: Endoscopy;  Laterality: N/A;  9:30am  . ESOPHAGOGASTRODUODENOSCOPY  10/03/2010   Procedure: ESOPHAGOGASTRODUODENOSCOPY (EGD);  Surgeon: Dorothyann Peng, MD;  Location: [facility];  Service: Endoscopy;  Laterality: N/A;  . ESOPHAGOGASTRODUODENOSCOPY  12/19/2002   RMR:  Normal esophagus/Normal stomach/ Duodenum large bulbar diverticulum, 1 cm bulbar ulcer with surrounding/ inflammation as described above. Normal D2  . POLYPECTOMY  08/11/2017   Procedure: POLYPECTOMY;  Surgeon: Danie Binder, MD;  Location: [facility];  Service: Endoscopy;;  cecal   . SAVORY DILATION  10/03/2010   Procedure: SAVORY DILATION;  Surgeon: Dorothyann Peng, MD;  Location: [facility];  Service: Endoscopy;;  . TOTAL ABDOMINAL HYSTERECTOMY  1970  . UPPER GASTROINTESTINAL ENDOSCOPY  OCT 2004 RMR   DUO TIC, & ULCER    Social History   Socioeconomic History  . Marital status: Single    Spouse name: Not on file  . Number of children: Not on file  . Years of education: Not on file  . Highest education level: Not on file  Occupational History  . Occupation: [employer]  Social Needs  . Financial resource strain: Not on file  . Food insecurity:    Worry: Not on file    Inability: Not on file  . Transportation needs:    Medical: Not on file    Non-medical: Not on file  Tobacco Use  . Smoking status: Former Smoker    Types: Cigarettes    Last attempt to quit: 02/20/1985    Years since quitting: 32.8  . Smokeless tobacco: Never Used  Substance and Sexual Activity  . Alcohol use: No  . Drug use: No  . Sexual activity: Not Currently  Lifestyle  . Physical activity:    Days per week: Not on file    Minutes per session: Not on file  . Stress: Not on file  Relationships  . Social connections:    Talks on phone: Not on file    Gets together: Not on file    Attends religious service: Not on file    Active member of club or organization: Not on file    Attends meetings of clubs or organizations: Not on file    Relationship status: Not on file  . Intimate partner violence:    Fear of current or ex partner: Not on file    Emotionally abused: Not on file    Physically abused: Not on file    Forced sexual activity: Not on file  Other Topics Concern  . Not on file  Social  History Narrative  . Not on file     Vitals:   12/03/17 0943  BP: 116/64  Pulse: 100  SpO2: 99%  Weight: 211 lb (95.7 kg)  Height: '5\' 9"'  (1.753 m)    Wt Readings from Last 3 Encounters:  12/03/17 211 lb (95.7 kg)  11/09/17 213 lb (96.6 kg)  08/18/17 211 lb (95.7 kg)     PHYSICAL EXAM General: NAD HEENT: Normal. Neck: No JVD, no thyromegaly. Lungs: Clear to auscultation bilaterally with normal respiratory effort. CV: Regular rate and rhythm, normal S1/S2, no S3/S4, no murmur. No pretibial  or periankle edema.  No carotid bruit.   Abdomen: Soft, nontender, no distention.  Neurologic: Alert and oriented.  Psych: Normal affect. Skin: Normal. Musculoskeletal: No gross deformities.    ECG: Reviewed above under Subjective   Labs: Lab Results  Component Value Date/Time   K 4.0 11/06/2017 09:47 AM   BUN 18 11/06/2017 09:47 AM   CREATININE 1.06 (H) 11/06/2017 09:47 AM   CREATININE 0.96 (H) 03/12/2016 09:25 AM   ALT 20 11/06/2017 09:47 AM   TSH 0.566 11/06/2017 09:47 AM   HGB 12.7 11/06/2017 09:47 AM     Lipids: Lab Results  Component Value Date/Time   LDLCALC 94 11/06/2017 09:47 AM   CHOL 170 11/06/2017 09:47 AM   TRIG 69 11/06/2017 09:47 AM   HDL 62 11/06/2017 09:47 AM       ASSESSMENT AND PLAN: 1.  Preoperative risk stratification: ECG demonstrates nonspecific T wave abnormalities.  She denies any typical anginal symptoms.  Previous stress test from 04/02/2015 reviewed above. I will order a 2-D echocardiogram with Doppler to evaluate cardiac structure, function, and regional wall motion.  2.  Hypertension: Controlled on benazepril 40 mg and hydrochlorothiazide 25 mg.  No changes to therapy.  3.  Hyperlipidemia: Lipids reviewed above and found to be within normal limits.  Continue pravastatin 40 mg daily.    Disposition: Follow up to be determined   Kate Sable, M.D., F.A.C.C.

## 2017-12-07 ENCOUNTER — Ambulatory Visit (HOSPITAL_COMMUNITY)
Admission: RE | Admit: 2017-12-07 | Discharge: 2017-12-07 | Disposition: A | Discharge status: Home / Self Care | Payer: Medicare Other | Source: Ambulatory Visit | Attending: Cardiovascular Disease | Admitting: Cardiovascular Disease

## 2017-12-07 DIAGNOSIS — Z01818 Encounter for other preprocedural examination: Secondary | ICD-10-CM | POA: Insufficient documentation

## 2017-12-07 DIAGNOSIS — E119 Type 2 diabetes mellitus without complications: Secondary | ICD-10-CM | POA: Insufficient documentation

## 2017-12-07 DIAGNOSIS — E785 Hyperlipidemia, unspecified: Secondary | ICD-10-CM | POA: Insufficient documentation

## 2017-12-07 DIAGNOSIS — R9431 Abnormal electrocardiogram [ECG] [EKG]: Secondary | ICD-10-CM | POA: Diagnosis not present

## 2017-12-07 DIAGNOSIS — K219 Gastro-esophageal reflux disease without esophagitis: Secondary | ICD-10-CM | POA: Insufficient documentation

## 2017-12-07 DIAGNOSIS — I119 Hypertensive heart disease without heart failure: Secondary | ICD-10-CM | POA: Insufficient documentation

## 2017-12-07 DIAGNOSIS — I080 Rheumatic disorders of both mitral and aortic valves: Secondary | ICD-10-CM | POA: Diagnosis not present

## 2017-12-07 NOTE — Progress Notes (Signed)
*  PRELIMINARY RESULTS* Echocardiogram 2D Echocardiogram has been performed.  Stacey Hanson 12/07/2017, 11:28 AM

## 2017-12-10 NOTE — Progress Notes (Signed)
[place] [facility]ology Dr Bronson Ing 12-03-17 epic   ECHO 12-07-17 epic   EKG 11-09-17 epic   CXR 11-10-17 epic   Hgba1c 11-06-17 epic

## 2017-12-10 NOTE — Patient Instructions (Signed)
Stacey Hanson  12/10/2017   Your procedure is scheduled on: 12-21-17   Report to [facility] Main  Entrance    Report to admitting at 7:30AM    Call this number if you have problems the morning of surgery [phone]     Remember: Do not eat food or drink liquids :After Midnight. BRUSH YOUR TEETH MORNING OF SURGERY AND RINSE YOUR MOUTH OUT, NO CHEWING GUM CANDY OR MINTS.     Take these medicines the morning of surgery with A SIP OF WATER: levothyroxine, omeprazole, pravastatin    >THE NIGHT BEFORE SURGERY , DO NOT TAKE NIGHT DOSE OF HUMALOG INSULIN. >IF YOUR BLOOD SUGAR IS HIGHER THAN 220 THE MORNING OF SURGERY , YOU MAY TAKE HALF YOUR NORMAL DOSE OF HUMALOG INSULIN  >DO NOT TAKE ANY METFORMIN DAY OF YOUR SURGERY                                 You may not have any metal on your body including hair pins and              piercings  Do not wear jewelry, make-up, lotions, powders or perfumes, deodorant             Do not wear nail polish.  Do not shave  48 hours prior to surgery.             Do not bring valuables to the hospital. [place].  Contacts, dentures or bridgework may not be worn into surgery.  Leave suitcase in the car. After surgery it may be brought to your room.                 Please read over the following fact sheets you were given: _____________________________________________________________________             [facility] - Preparing for Surgery Before surgery, you can play an important role.  Because skin is not sterile, your skin needs to be as free of germs as possible.  You can reduce the number of germs on your skin by washing with CHG (chlorahexidine gluconate) soap before surgery.  CHG is an antiseptic cleaner which kills germs and bonds with the skin to continue killing germs even after washing. Please DO NOT use if you have an allergy to CHG or antibacterial soaps.  If  your skin becomes reddened/irritated stop using the CHG and inform your nurse when you arrive at Short Stay. Do not shave (including legs and underarms) for at least 48 hours prior to the first CHG shower.  You may shave your face/neck. Please follow these instructions carefully:  1.  Shower with CHG Soap the night before surgery and the  morning of Surgery.  2.  If you choose to wash your hair, wash your hair first as usual with your  normal  shampoo.  3.  After you shampoo, rinse your hair and body thoroughly to remove the  shampoo.                           4.  Use CHG as you would any other liquid soap.  You can apply chg directly  to the skin and  wash                       Gently with a scrungie or clean washcloth.  5.  Apply the CHG Soap to your body ONLY FROM THE NECK DOWN.   Do not use on face/ open                           Wound or open sores. Avoid contact with eyes, ears mouth and genitals (private parts).                       Wash face,  Genitals (private parts) with your normal soap.             6.  Wash thoroughly, paying special attention to the area where your surgery  will be performed.  7.  Thoroughly rinse your body with warm water from the neck down.  8.  DO NOT shower/wash with your normal soap after using and rinsing off  the CHG Soap.                9.  Pat yourself dry with a clean towel.            10.  Wear clean pajamas.            11.  Place clean sheets on your bed the night of your first shower and do not  sleep with pets. Day of Surgery : Do not apply any lotions/deodorants the morning of surgery.  Please wear clean clothes to the hospital/surgery center.  FAILURE TO FOLLOW THESE INSTRUCTIONS MAY RESULT IN THE CANCELLATION OF YOUR SURGERY PATIENT SIGNATURE_________________________________  NURSE SIGNATURE__________________________________  ________________________________________________________________________   Adam Phenix  An incentive  spirometer is a tool that can help keep your lungs clear and active. This tool measures how well you are filling your lungs with each breath. Taking long deep breaths may help reverse or decrease the chance of developing breathing (pulmonary) problems (especially infection) following:  A long period of time when you are unable to move or be active. BEFORE THE PROCEDURE   If the spirometer includes an indicator to show your best effort, your nurse or respiratory therapist will set it to a desired goal.  If possible, sit up straight or lean slightly forward. Try not to slouch.  Hold the incentive spirometer in an upright position. INSTRUCTIONS FOR USE  1. Sit on the edge of your bed if possible, or sit up as far as you can in bed or on a chair. 2. Hold the incentive spirometer in an upright position. 3. Breathe out normally. 4. Place the mouthpiece in your mouth and seal your lips tightly around it. 5. Breathe in slowly and as deeply as possible, raising the piston or the ball toward the top of the column. 6. Hold your breath for 3-5 seconds or for as long as possible. Allow the piston or ball to fall to the bottom of the column. 7. Remove the mouthpiece from your mouth and breathe out normally. 8. Rest for a few seconds and repeat Steps 1 through 7 at least 10 times every 1-2 hours when you are awake. Take your time and take a few normal breaths between deep breaths. 9. The spirometer may include an indicator to show your best effort. Use the indicator as a goal to work toward during each repetition. 10. After each set of 10 deep  breaths, practice coughing to be sure your lungs are clear. If you have an incision (the cut made at the time of surgery), support your incision when coughing by placing a pillow or rolled up towels firmly against it. Once you are able to get out of bed, walk around indoors and cough well. You may stop using the incentive spirometer when instructed by your caregiver.   RISKS AND COMPLICATIONS  Take your time so you do not get dizzy or light-headed.  If you are in pain, you may need to take or ask for pain medication before doing incentive spirometry. It is harder to take a deep breath if you are having pain. AFTER USE  Rest and breathe slowly and easily.  It can be helpful to keep track of a log of your progress. Your caregiver can provide you with a simple table to help with this. If you are using the spirometer at home, follow these instructions: [place] IF:   You are having difficultly using the spirometer.  You have trouble using the spirometer as often as instructed.  Your pain medication is not giving enough relief while using the spirometer.  You develop fever of 100.5 F (38.1 C) or higher. SEEK IMMEDIATE MEDICAL CARE IF:   You cough up bloody sputum that had not been present before.  You develop fever of 102 F (38.9 C) or greater.  You develop worsening pain at or near the incision site. MAKE SURE YOU:   Understand these instructions.  Will watch your condition.  Will get help right away if you are not doing well or get worse. Document Released: 06/29/2006 Document Revised: 05/11/2011 Document Reviewed: 08/30/2006 ExitCare Patient Information 2014 ExitCare, [place].   ________________________________________________________________________  WHAT IS A BLOOD TRANSFUSION? Blood Transfusion Information  A transfusion is the replacement of blood or some of its parts. Blood is made up of multiple cells which provide different functions.  Red blood cells carry oxygen and are used for blood loss replacement.  White blood cells fight against infection.  Platelets control bleeding.  Plasma helps clot blood.  Other blood products are available for specialized needs, such as hemophilia or other clotting disorders. BEFORE THE TRANSFUSION  Who gives blood for transfusions?   Healthy volunteers who are fully evaluated  to make sure their blood is safe. This is blood bank blood. Transfusion therapy is the safest it has ever been in the practice of medicine. Before blood is taken from a donor, a complete history is taken to make sure that person has no history of diseases nor engages in risky social behavior (examples are intravenous drug use or sexual activity with multiple partners). The donor's travel history is screened to minimize risk of transmitting infections, such as malaria. The donated blood is tested for signs of infectious diseases, such as HIV and hepatitis. The blood is then tested to be sure it is compatible with you in order to minimize the chance of a transfusion reaction. If you or a relative donates blood, this is often done in anticipation of surgery and is not appropriate for emergency situations. It takes many days to process the donated blood. RISKS AND COMPLICATIONS Although transfusion therapy is very safe and saves many lives, the main dangers of transfusion include:   Getting an infectious disease.  Developing a transfusion reaction. This is an allergic reaction to something in the blood you were given. Every precaution is taken to prevent this. The decision to have a blood transfusion has  been considered carefully by your caregiver before blood is given. Blood is not given unless the benefits outweigh the risks. AFTER THE TRANSFUSION  Right after receiving a blood transfusion, you will usually feel much better and more energetic. This is especially true if your red blood cells have gotten low (anemic). The transfusion raises the level of the red blood cells which carry oxygen, and this usually causes an energy increase.  The nurse administering the transfusion will monitor you carefully for complications. HOME CARE INSTRUCTIONS  No special instructions are needed after a transfusion. You may find your energy is better. Speak with your caregiver about any limitations on activity for  underlying diseases you may have. SEEK MEDICAL CARE IF:   Your condition is not improving after your transfusion.  You develop redness or irritation at the intravenous (IV) site. SEEK IMMEDIATE MEDICAL CARE IF:  Any of the following symptoms occur over the next 12 hours:  Shaking chills.  You have a temperature by mouth above 102 F (38.9 C), not controlled by medicine.  Chest, back, or muscle pain.  People around you feel you are not acting correctly or are confused.  Shortness of breath or difficulty breathing.  Dizziness and fainting.  You get a rash or develop hives.  You have a decrease in urine output.  Your urine turns a dark color or changes to pink, red, or brown. Any of the following symptoms occur over the next 10 days:  You have a temperature by mouth above 102 F (38.9 C), not controlled by medicine.  Shortness of breath.  Weakness after normal activity.  The white part of the eye turns yellow (jaundice).  You have a decrease in the amount of urine or are urinating less often.  Your urine turns a dark color or changes to pink, red, or brown. Document Released: 02/14/2000 Document Revised: 05/11/2011 Document Reviewed: 10/03/2007 [facility] Patient Information 2014 [place], [place].  _______________________________________________________________________

## 2017-12-12 ENCOUNTER — Other Ambulatory Visit: Payer: Self-pay | Admitting: Family Medicine

## 2017-12-13 ENCOUNTER — Other Ambulatory Visit: Payer: Self-pay

## 2017-12-13 ENCOUNTER — Encounter (HOSPITAL_COMMUNITY)
Admission: RE | Admit: 2017-12-13 | Discharge: 2017-12-13 | Disposition: A | Discharge status: Home / Self Care | Payer: Medicare Other | Source: Ambulatory Visit | Attending: Orthopedic Surgery | Admitting: Orthopedic Surgery

## 2017-12-13 ENCOUNTER — Encounter (HOSPITAL_COMMUNITY): Payer: Self-pay

## 2017-12-13 DIAGNOSIS — M1712 Unilateral primary osteoarthritis, left knee: Secondary | ICD-10-CM | POA: Insufficient documentation

## 2017-12-13 DIAGNOSIS — M25562 Pain in left knee: Secondary | ICD-10-CM | POA: Diagnosis not present

## 2017-12-13 DIAGNOSIS — Z01812 Encounter for preprocedural laboratory examination: Secondary | ICD-10-CM | POA: Insufficient documentation

## 2017-12-13 LAB — CBC
HCT: 38.8 % (ref 36.0–46.0)
HEMOGLOBIN: 12.5 g/dL (ref 12.0–15.0)
MCH: 30.1 pg (ref 26.0–34.0)
MCHC: 32.2 g/dL (ref 30.0–36.0)
MCV: 93.5 fL (ref 80.0–100.0)
Platelets: 476 10*3/uL — ABNORMAL HIGH (ref 150–400)
RBC: 4.15 MIL/uL (ref 3.87–5.11)
RDW: 13.1 % (ref 11.5–15.5)
WBC: 9.1 10*3/uL (ref 4.0–10.5)
nRBC: 0 % (ref 0.0–0.2)

## 2017-12-13 LAB — BASIC METABOLIC PANEL
Anion gap: 12 (ref 5–15)
BUN: 23 mg/dL (ref 8–23)
CO2: 25 mmol/L (ref 22–32)
Calcium: 9.8 mg/dL (ref 8.9–10.3)
Chloride: 104 mmol/L (ref 98–111)
Creatinine, Ser: 0.96 mg/dL (ref 0.44–1.00)
GFR, EST NON AFRICAN AMERICAN: 58 mL/min — AB (ref >60)
Glucose, Bld: 94 mg/dL (ref 70–99)
POTASSIUM: 4.2 mmol/L (ref 3.5–5.1)
SODIUM: 141 mmol/L (ref 135–145)

## 2017-12-13 LAB — SURGICAL PCR SCREEN
MRSA, PCR: NEGATIVE
Staphylococcus aureus: POSITIVE — AB

## 2017-12-13 LAB — GLUCOSE, CAPILLARY: Glucose-Capillary: 115 mg/dL — ABNORMAL HIGH (ref 70–99)

## 2017-12-13 LAB — ABO/RH: ABO/RH(D): O POS

## 2017-12-13 NOTE — H&P (Signed)
TOTAL KNEE ADMISSION H&P  Patient is being admitted for left total knee arthroplasty.  Subjective:  Chief Complaint:   Left knee primary OA / pain  HPI: Stacey Hanson, 72 y.o. female, has a history of pain and functional disability in the left knee due to arthritis and has failed non-surgical conservative treatments for greater than 12 weeks to include NSAID's and/or analgesics, corticosteriod injections and activity modification.  Onset of symptoms was gradual, starting 2+ years ago with gradually worsening course since that time. The patient noted no past surgery on the left knee(s).  Patient currently rates pain in the left knee(s) at 9 out of 10 with activity. Patient has night pain, worsening of pain with activity and weight bearing, pain that interferes with activities of daily living, pain with passive range of motion, crepitus and joint swelling.  Patient has evidence of periarticular osteophytes and joint space narrowing by imaging studies.   There is no active infection.  Risks, benefits and expectations were discussed with the patient.  Risks including but not limited to the risk of anesthesia, blood clots, nerve damage, blood vessel damage, failure of the prosthesis, infection and up to and including death.  Patient understand the risks, benefits and expectations and wishes to proceed with surgery.   PCP: Stacey Helper, MD  D/C Plans:     SNF  Post-op Meds:       No Rx given  Tranexamic Acid:      To be given - IV   Decadron:      Is to be given  FYI:      ASA  Norco  DME:   Rx given for - RW & 3-n-1  PT:   SNF   Patient Active Problem List   Diagnosis Date Noted  . Need for immunization against influenza 11/14/2017  . Pre-operative clearance 11/14/2017  . Encounter for screening colonoscopy 07/07/2017  . Back pain 04/30/2015  . Seasonal allergies 02/21/2015  . Annual physical exam 09/27/2014  . Osteopenia 06/20/2013  . Hypothyroidism, postradioiodine therapy  12/28/2010  . GERD (gastroesophageal reflux disease) 09/22/2010  . DEGENERATIVE JOINT DISEASE, KNEES, BILATERAL 04/14/2007  . Diabetes mellitus, insulin dependent (IDDM), controlled ([place]) 03/31/2007  . Hyperlipidemia 03/31/2007  . Obesity 03/31/2007  . ESSENTIAL HYPERTENSION, BENIGN 03/31/2007   Past Medical History:  Diagnosis Date  . BMI 30.0-30.9,adult 2012 215 LBS   2004 198 LBS  . Diabetes mellitus   . 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 H. pylori infection 09/24/2010  . Hyperlipidemia   . Hypertension   . Hypothyroidism   . IDDM (insulin dependent diabetes mellitus) ([place]) 1987  . Knee pain   . Obesity     Past Surgical History:  Procedure Laterality Date  . ABDOMINAL HYSTERECTOMY  1970  . CHOLECYSTECTOMY  OCT 2010 BZ BILIARY DYSKINESIA   CHRONIC CHOLECYSTITIS  . COLONOSCOPY  MAY 2009 SCREENING   pTC TICS, SML IH  . COLONOSCOPY N/A 08/11/2017   Procedure: COLONOSCOPY;  Surgeon: Danie Binder, MD;  Location: [facility];  Service: Endoscopy;  Laterality: N/A;  9:30am  . ESOPHAGOGASTRODUODENOSCOPY  10/03/2010   Procedure: ESOPHAGOGASTRODUODENOSCOPY (EGD);  Surgeon: Dorothyann Peng, MD;  Location: [facility] SU[facility];  Service: Endoscopy;  Laterality: N/A;  . ESOPHAGOGASTRODUODENOSCOPY  12/19/2002   RMR: Normal esophagus/Normal stomach/ Duodenum large bulbar diverticulum, 1 cm bulbar ulcer with surrounding/ inflammation as described above. Normal D2  . POLYPECTOMY  08/11/2017   Procedure: POLYPECTOMY;  Surgeon: Danie Binder, MD;  Location: [facility];  Service: Endoscopy;;  cecal   . SAVORY DILATION  10/03/2010   Procedure: SAVORY DILATION;  Surgeon: Dorothyann Peng, MD;  Location: [facility];  Service: Endoscopy;;  . TOTAL ABDOMINAL HYSTERECTOMY  1970  . UPPER GASTROINTESTINAL ENDOSCOPY  OCT 2004 RMR   DUO TIC, & ULCER    No current facility-administered medications for this encounter.    Current Outpatient Medications  Medication Sig Dispense  Refill Last Dose  . ASPIRIN LOW DOSE 81 MG EC tablet TAKE 1 TABLET(81 MG) BY MOUTH DAILY (Patient taking differently: Take 81 mg by mouth daily. ) 100 tablet 3 Taking  . benazepril (LOTENSIN) 40 MG tablet TAKE 1 TABLET(40 MG) BY MOUTH DAILY (Patient taking differently: Take 40 mg by mouth daily. ) 90 tablet 0 Taking  . Calcium Carb-Cholecalciferol (CALCIUM 600/VITAMIN D3 PO) Take 2 tablets by mouth daily.     Marland Kitchen HUMALOG MIX 75/25 KWIKPEN (75-25) 100 UNIT/ML Kwikpen INJECT 40 UNITS INTO THE SKIN TWICE DAILY (Patient taking differently: Inject 40 Units into the skin 2 (two) times daily before a meal. ) 30 mL 2 Taking  . hydrochlorothiazide (HYDRODIURIL) 25 MG tablet TAKE 1 TABLET(25 MG) BY MOUTH DAILY (Patient taking differently: Take 25 mg by mouth daily. ) 90 tablet 0 Taking  . levothyroxine (SYNTHROID, LEVOTHROID) 88 MCG tablet TAKE 1 TABLET BY MOUTH EVERY DAY IN THE MORNING (Patient taking differently: Take 88 mcg by mouth daily before breakfast. ) 30 tablet 12 Taking  . metFORMIN (GLUCOPHAGE) 1000 MG tablet TAKE 1 TABLET(1000 MG) BY MOUTH TWICE DAILY WITH A MEAL (Patient taking differently: Take 1,000 mg by mouth 2 (two) times daily before a meal. ) 180 tablet 0 Taking  . Multiple Vitamin (MULTIVITAMIN) capsule Take 1 capsule by mouth daily.     Taking  . omeprazole (PRILOSEC) 20 MG capsule TAKE 1 CAPSULE BY MOUTH EVERY MORNING (Patient taking differently: Take 20 mg by mouth daily. ) 30 capsule 5 Taking  . pravastatin (PRAVACHOL) 40 MG tablet TAKE 1 TABLET(40 MG) BY MOUTH DAILY (Patient taking differently: Take 40 mg by mouth daily. ) 90 tablet 1 Taking  . B-D UF III MINI PEN NEEDLES 31G X 5 MM MISC USE FOR TWICE DAILY INJECTIONS OF HUMALOG (Patient not taking: Reported on 12/08/2017) 100 each 6 Not Taking at Unknown time  . glucose blood (ONE TOUCH ULTRA TEST) test strip USE AS DIRECTED TWICE DAILY dx e11.9 (Patient not taking: Reported on 12/08/2017) 100 each 5 Not Taking at Unknown time  . ONETOUCH  DELICA LANCETS 79G MISC USE  TO CHECK GLUCOSE TWICE DAILY AS DIRECTED dx e11.9 (Patient not taking: Reported on 12/08/2017) 100 each 5 Not Taking at Unknown time  . UNABLE TO FIND Diabetic shoes (Patient not taking: Reported on 12/08/2017) 1 each 0 Not Taking at Unknown time   Allergies  Allergen Reactions  . Pravastatin Other (See Comments)    PT is currently taking but states she cramps  . Simvastatin Other (See Comments)    Muscle cramps    Social History   Tobacco Use  . Smoking status: Former Smoker    Types: Cigarettes    Last attempt to quit: 02/20/1985    Years since quitting: 32.8  . Smokeless tobacco: Never Used  Substance Use Topics  . Alcohol use: No    Family History  Problem Relation Age of Onset  . Diabetes Mother   . Hypertension Mother   .  Heart failure Mother   . Cancer Father 44       prostate and colon  . Diabetes Sister   . Hypertension Sister   . Hypertension Sister   . Diabetes Sister   . Diabetes Brother   . Hypertension Brother   . Diabetes Brother   . Hypertension Brother   . Coronary artery disease Brother 35       MI  . Colon cancer Neg Hx   . Colon polyps Neg Hx      Review of Systems  Constitutional: Negative.   HENT: Positive for tinnitus.   Eyes: Negative.   Respiratory: Negative.   Cardiovascular: Negative.   Gastrointestinal: Positive for constipation and heartburn.  Genitourinary: Positive for frequency.  Musculoskeletal: Positive for joint pain.  Skin: Negative.   Neurological: Negative.   Endo/Heme/Allergies: Positive for environmental allergies.  Psychiatric/Behavioral: Negative.     Objective:  Physical Exam  Constitutional: She is oriented to person, place, and time. She appears well-developed.  HENT:  Head: Normocephalic.  Eyes: Pupils are equal, round, and reactive to light.  Neck: Neck supple. No JVD present. No tracheal deviation present. No thyromegaly present.  Cardiovascular: Normal rate, regular rhythm and  intact distal pulses.  Respiratory: Effort normal and breath sounds normal. No respiratory distress. She has no wheezes.  GI: Soft. There is no tenderness. There is no guarding.  Musculoskeletal:       Left knee: She exhibits decreased range of motion, swelling and bony tenderness. She exhibits no ecchymosis, no deformity, no laceration and no erythema. Tenderness found.  Lymphadenopathy:    She has no cervical adenopathy.  Neurological: She is alert and oriented to person, place, and time. A sensory deficit (neuropathy in bilateral feet ) is present.  Skin: Skin is warm and dry.  Psychiatric: She has a normal mood and affect.      Labs:  Estimated body mass index is 31.16 kg/m as calculated from the following:   Height as of 12/03/17: _0  (1.753 m).   Weight as of 12/03/17: 95.7 kg.   Imaging Review Plain radiographs demonstrate severe degenerative joint disease of the left knee(s). The overall alignment is significant valgus. The bone quality appears to be good for age and reported activity level.   Preoperative templating of the joint replacement has been completed, documented, and submitted to the Operating Room personnel in order to optimize intra-operative equipment management.   Anticipated LOS equal to or greater than 2 midnights due to - Age 42 and older with one or more of the following:  - Obesity  - Expected need for hospital services (PT, OT, Nursing) required for safe  discharge  - Anticipated need for postoperative skilled nursing care or inpatient rehab  - Active co-morbidities: Diabetes    Assessment/Plan:  End stage arthritis, left knee   The patient history, physical examination, clinical judgment of the provider and imaging studies are consistent with end stage degenerative joint disease of the left knee and total knee arthroplasty is deemed medically necessary. The treatment options including medical management, injection therapy arthroscopy and  arthroplasty were discussed at length. The risks and benefits of total knee arthroplasty were presented and reviewed. The risks due to aseptic loosening, infection, stiffness, patella tracking problems, thromboembolic complications and other imponderables were discussed. The patient acknowledged the explanation, agreed to proceed with the plan and consent was signed. Patient is being admitted for inpatient treatment for surgery, pain control, PT, OT, prophylactic antibiotics, VTE prophylaxis, progressive ambulation  and ADL's and discharge planning. The patient is planning to be discharged to skilled nursing facility.     [place] Kaytie Ratcliffe   PA-C  12/13/2017, 9:50 AM

## 2017-12-16 ENCOUNTER — Other Ambulatory Visit: Payer: Self-pay | Admitting: Family Medicine

## 2017-12-21 ENCOUNTER — Other Ambulatory Visit: Payer: Self-pay

## 2017-12-21 ENCOUNTER — Encounter (HOSPITAL_COMMUNITY): Payer: Self-pay | Admitting: *Deleted

## 2017-12-21 ENCOUNTER — Inpatient Hospital Stay (HOSPITAL_COMMUNITY)
Admission: RE | Admit: 2017-12-21 | Discharge: 2017-12-24 | DRG: 470 | Disposition: A | Discharge status: Skilled Nursing Facility | Payer: Medicare Other | Source: Ambulatory Visit | Attending: Orthopedic Surgery | Admitting: Orthopedic Surgery

## 2017-12-21 ENCOUNTER — Inpatient Hospital Stay (HOSPITAL_COMMUNITY): Payer: Medicare Other | Admitting: Anesthesiology

## 2017-12-21 ENCOUNTER — Encounter (HOSPITAL_COMMUNITY)
Admission: RE | Disposition: A | Discharge status: Skilled Nursing Facility | Payer: Self-pay | Source: Ambulatory Visit | Attending: Orthopedic Surgery

## 2017-12-21 DIAGNOSIS — Z79899 Other long term (current) drug therapy: Secondary | ICD-10-CM | POA: Diagnosis not present

## 2017-12-21 DIAGNOSIS — M1991 Primary osteoarthritis, unspecified site: Secondary | ICD-10-CM | POA: Diagnosis not present

## 2017-12-21 DIAGNOSIS — E119 Type 2 diabetes mellitus without complications: Secondary | ICD-10-CM | POA: Diagnosis present

## 2017-12-21 DIAGNOSIS — Z471 Aftercare following joint replacement surgery: Secondary | ICD-10-CM | POA: Diagnosis not present

## 2017-12-21 DIAGNOSIS — Z794 Long term (current) use of insulin: Secondary | ICD-10-CM

## 2017-12-21 DIAGNOSIS — Z833 Family history of diabetes mellitus: Secondary | ICD-10-CM

## 2017-12-21 DIAGNOSIS — K219 Gastro-esophageal reflux disease without esophagitis: Secondary | ICD-10-CM | POA: Diagnosis present

## 2017-12-21 DIAGNOSIS — Z800 Family history of malignant neoplasm of digestive organs: Secondary | ICD-10-CM

## 2017-12-21 DIAGNOSIS — Z79890 Hormone replacement therapy (postmenopausal): Secondary | ICD-10-CM

## 2017-12-21 DIAGNOSIS — Z87891 Personal history of nicotine dependence: Secondary | ICD-10-CM

## 2017-12-21 DIAGNOSIS — E039 Hypothyroidism, unspecified: Secondary | ICD-10-CM | POA: Diagnosis not present

## 2017-12-21 DIAGNOSIS — E785 Hyperlipidemia, unspecified: Secondary | ICD-10-CM | POA: Diagnosis present

## 2017-12-21 DIAGNOSIS — E890 Postprocedural hypothyroidism: Secondary | ICD-10-CM | POA: Diagnosis not present

## 2017-12-21 DIAGNOSIS — M2550 Pain in unspecified joint: Secondary | ICD-10-CM | POA: Diagnosis not present

## 2017-12-21 DIAGNOSIS — R278 Other lack of coordination: Secondary | ICD-10-CM | POA: Diagnosis not present

## 2017-12-21 DIAGNOSIS — Z8042 Family history of malignant neoplasm of prostate: Secondary | ICD-10-CM

## 2017-12-21 DIAGNOSIS — Z888 Allergy status to other drugs, medicaments and biological substances status: Secondary | ICD-10-CM

## 2017-12-21 DIAGNOSIS — Z90710 Acquired absence of both cervix and uterus: Secondary | ICD-10-CM

## 2017-12-21 DIAGNOSIS — Z6830 Body mass index (BMI) 30.0-30.9, adult: Secondary | ICD-10-CM | POA: Diagnosis not present

## 2017-12-21 DIAGNOSIS — R2689 Other abnormalities of gait and mobility: Secondary | ICD-10-CM | POA: Diagnosis not present

## 2017-12-21 DIAGNOSIS — M8580 Other specified disorders of bone density and structure, unspecified site: Secondary | ICD-10-CM | POA: Diagnosis present

## 2017-12-21 DIAGNOSIS — K828 Other specified diseases of gallbladder: Secondary | ICD-10-CM | POA: Diagnosis not present

## 2017-12-21 DIAGNOSIS — Z8711 Personal history of peptic ulcer disease: Secondary | ICD-10-CM | POA: Diagnosis not present

## 2017-12-21 DIAGNOSIS — M170 Bilateral primary osteoarthritis of knee: Secondary | ICD-10-CM | POA: Diagnosis not present

## 2017-12-21 DIAGNOSIS — G8918 Other acute postprocedural pain: Secondary | ICD-10-CM | POA: Diagnosis not present

## 2017-12-21 DIAGNOSIS — M6281 Muscle weakness (generalized): Secondary | ICD-10-CM | POA: Diagnosis not present

## 2017-12-21 DIAGNOSIS — I10 Essential (primary) hypertension: Secondary | ICD-10-CM | POA: Diagnosis not present

## 2017-12-21 DIAGNOSIS — Z96659 Presence of unspecified artificial knee joint: Secondary | ICD-10-CM

## 2017-12-21 DIAGNOSIS — Z8249 Family history of ischemic heart disease and other diseases of the circulatory system: Secondary | ICD-10-CM

## 2017-12-21 DIAGNOSIS — Z96652 Presence of left artificial knee joint: Secondary | ICD-10-CM

## 2017-12-21 DIAGNOSIS — Z7401 Bed confinement status: Secondary | ICD-10-CM | POA: Diagnosis not present

## 2017-12-21 DIAGNOSIS — E669 Obesity, unspecified: Secondary | ICD-10-CM | POA: Diagnosis present

## 2017-12-21 DIAGNOSIS — Z7982 Long term (current) use of aspirin: Secondary | ICD-10-CM

## 2017-12-21 DIAGNOSIS — M1712 Unilateral primary osteoarthritis, left knee: Secondary | ICD-10-CM | POA: Diagnosis not present

## 2017-12-21 HISTORY — PX: TOTAL KNEE ARTHROPLASTY: SHX125

## 2017-12-21 LAB — GLUCOSE, CAPILLARY
GLUCOSE-CAPILLARY: 175 mg/dL — AB (ref 70–99)
GLUCOSE-CAPILLARY: 288 mg/dL — AB (ref 70–99)
Glucose-Capillary: 151 mg/dL — ABNORMAL HIGH (ref 70–99)
Glucose-Capillary: 332 mg/dL — ABNORMAL HIGH (ref 70–99)

## 2017-12-21 LAB — TYPE AND SCREEN
ABO/RH(D): O POS
ANTIBODY SCREEN: NEGATIVE

## 2017-12-21 SURGERY — ARTHROPLASTY, KNEE, TOTAL
Anesthesia: Spinal | Site: Knee | Laterality: Left

## 2017-12-21 MED ORDER — PROPOFOL 10 MG/ML IV BOLUS
INTRAVENOUS | Status: AC
  Filled 2017-12-21: qty 60

## 2017-12-21 MED ORDER — ONDANSETRON HCL 4 MG/2ML IJ SOLN: 4.0000 mg | Freq: Four times a day (QID) | INTRAMUSCULAR | Status: DC | PRN

## 2017-12-21 MED ORDER — ROPIVACAINE HCL 5 MG/ML IJ SOLN
INTRAMUSCULAR | Status: DC | PRN
  Administered 2017-12-21: 30 mL via PERINEURAL

## 2017-12-21 MED ORDER — SODIUM CHLORIDE 0.9 % IV SOLN
INTRAVENOUS | Status: DC
  Administered 2017-12-21 – 2017-12-22 (×2): via INTRAVENOUS

## 2017-12-21 MED ORDER — METHOCARBAMOL 500 MG IVPB - SIMPLE MED
500.0000 mg | Freq: Four times a day (QID) | INTRAVENOUS | Status: DC | PRN
  Filled 2017-12-21: qty 50

## 2017-12-21 MED ORDER — SODIUM CHLORIDE 0.9 % IV SOLN
INTRAVENOUS | Status: DC | PRN
  Administered 2017-12-21: 50 ug/min via INTRAVENOUS

## 2017-12-21 MED ORDER — POLYETHYLENE GLYCOL 3350 17 G PO PACK
17.0000 g | PACK | Freq: Two times a day (BID) | ORAL | Status: DC
  Administered 2017-12-21 – 2017-12-24 (×6): 17 g via ORAL
  Filled 2017-12-21 (×6): qty 1

## 2017-12-21 MED ORDER — PHENYLEPHRINE 40 MCG/ML (10ML) SYRINGE FOR IV PUSH (FOR BLOOD PRESSURE SUPPORT)
PREFILLED_SYRINGE | INTRAVENOUS | Status: AC
  Filled 2017-12-21: qty 10

## 2017-12-21 MED ORDER — CHLORHEXIDINE GLUCONATE 4 % EX LIQD: 60.0000 mL | Freq: Once | CUTANEOUS | Status: DC

## 2017-12-21 MED ORDER — KETOROLAC TROMETHAMINE 30 MG/ML IJ SOLN
INTRAMUSCULAR | Status: DC | PRN
  Administered 2017-12-21: 30 mg

## 2017-12-21 MED ORDER — PHENOL 1.4 % MT LIQD
1.0000 | OROMUCOSAL | Status: DC | PRN
  Filled 2017-12-21: qty 177

## 2017-12-21 MED ORDER — CEFAZOLIN SODIUM-DEXTROSE 2-4 GM/100ML-% IV SOLN
2.0000 g | INTRAVENOUS | Status: AC
  Administered 2017-12-21: 2 g via INTRAVENOUS
  Filled 2017-12-21: qty 100

## 2017-12-21 MED ORDER — OXYCODONE HCL 5 MG PO TABS: 5.0000 mg | ORAL_TABLET | Freq: Once | ORAL | Status: DC | PRN

## 2017-12-21 MED ORDER — GLYCOPYRROLATE PF 0.2 MG/ML IJ SOSY
PREFILLED_SYRINGE | INTRAMUSCULAR | Status: AC
  Filled 2017-12-21: qty 1

## 2017-12-21 MED ORDER — SODIUM CHLORIDE 0.9 % IJ SOLN
INTRAMUSCULAR | Status: AC
  Filled 2017-12-21: qty 50

## 2017-12-21 MED ORDER — DOCUSATE SODIUM 100 MG PO CAPS: 100.0000 mg | ORAL_CAPSULE | Freq: Two times a day (BID) | ORAL | 0 refills | Status: DC

## 2017-12-21 MED ORDER — BUPIVACAINE IN DEXTROSE 0.75-8.25 % IT SOLN
INTRATHECAL | Status: DC | PRN
  Administered 2017-12-21: 1.8 mL via INTRATHECAL

## 2017-12-21 MED ORDER — SODIUM CHLORIDE 0.9 % IJ SOLN
INTRAMUSCULAR | Status: DC | PRN
  Administered 2017-12-21: 30 mL

## 2017-12-21 MED ORDER — PRAVASTATIN SODIUM 20 MG PO TABS
40.0000 mg | ORAL_TABLET | Freq: Every day | ORAL | Status: DC
  Administered 2017-12-22 – 2017-12-24 (×3): 40 mg via ORAL
  Filled 2017-12-21 (×3): qty 2

## 2017-12-21 MED ORDER — PHENYLEPHRINE HCL 10 MG/ML IJ SOLN
INTRAMUSCULAR | Status: AC
  Filled 2017-12-21: qty 1

## 2017-12-21 MED ORDER — MAGNESIUM CITRATE PO SOLN: 1.0000 | Freq: Once | ORAL | Status: DC | PRN

## 2017-12-21 MED ORDER — HYDROMORPHONE HCL 1 MG/ML IJ SOLN: 0.5000 mg | INTRAMUSCULAR | Status: DC | PRN

## 2017-12-21 MED ORDER — LACTATED RINGERS IV SOLN
INTRAVENOUS | Status: DC
  Administered 2017-12-21 (×2): via INTRAVENOUS

## 2017-12-21 MED ORDER — GLYCOPYRROLATE 0.2 MG/ML IJ SOLN
INTRAMUSCULAR | Status: DC | PRN
  Administered 2017-12-21: 0.1 mg via INTRAVENOUS

## 2017-12-21 MED ORDER — PROPOFOL 10 MG/ML IV BOLUS
INTRAVENOUS | Status: AC
  Filled 2017-12-21: qty 20

## 2017-12-21 MED ORDER — METHOCARBAMOL 500 MG PO TABS: 500.0000 mg | ORAL_TABLET | Freq: Four times a day (QID) | ORAL | 0 refills | Status: DC | PRN

## 2017-12-21 MED ORDER — FENTANYL CITRATE (PF) 100 MCG/2ML IJ SOLN
INTRAMUSCULAR | Status: AC
  Filled 2017-12-21: qty 2

## 2017-12-21 MED ORDER — FENTANYL CITRATE (PF) 100 MCG/2ML IJ SOLN
50.0000 ug | Freq: Once | INTRAMUSCULAR | Status: AC
  Administered 2017-12-21: 50 ug via INTRAVENOUS
  Filled 2017-12-21: qty 2

## 2017-12-21 MED ORDER — METHOCARBAMOL 500 MG PO TABS
500.0000 mg | ORAL_TABLET | Freq: Four times a day (QID) | ORAL | Status: DC | PRN
  Administered 2017-12-21 – 2017-12-23 (×6): 500 mg via ORAL
  Filled 2017-12-21 (×6): qty 1

## 2017-12-21 MED ORDER — STERILE WATER FOR IRRIGATION IR SOLN
Status: DC | PRN
  Administered 2017-12-21: 3000 mL

## 2017-12-21 MED ORDER — ONDANSETRON HCL 4 MG/2ML IJ SOLN: 4.0000 mg | Freq: Once | INTRAMUSCULAR | Status: DC | PRN

## 2017-12-21 MED ORDER — KETOROLAC TROMETHAMINE 30 MG/ML IJ SOLN
INTRAMUSCULAR | Status: AC
  Filled 2017-12-21: qty 1

## 2017-12-21 MED ORDER — FERROUS SULFATE 325 (65 FE) MG PO TABS
325.0000 mg | ORAL_TABLET | Freq: Two times a day (BID) | ORAL | Status: DC
  Administered 2017-12-21 – 2017-12-24 (×6): 325 mg via ORAL
  Filled 2017-12-21 (×6): qty 1

## 2017-12-21 MED ORDER — LEVOTHYROXINE SODIUM 88 MCG PO TABS
88.0000 ug | ORAL_TABLET | Freq: Every day | ORAL | Status: DC
  Administered 2017-12-22 – 2017-12-24 (×3): 88 ug via ORAL
  Filled 2017-12-21 (×3): qty 1

## 2017-12-21 MED ORDER — OMEPRAZOLE 20 MG PO CPDR
20.0000 mg | DELAYED_RELEASE_CAPSULE | Freq: Every day | ORAL | Status: DC
  Administered 2017-12-22 – 2017-12-23 (×2): 20 mg via ORAL
  Filled 2017-12-21 (×2): qty 1

## 2017-12-21 MED ORDER — MIDAZOLAM HCL 2 MG/2ML IJ SOLN
1.0000 mg | Freq: Once | INTRAMUSCULAR | Status: AC
  Administered 2017-12-21: 1 mg via INTRAVENOUS
  Filled 2017-12-21: qty 2

## 2017-12-21 MED ORDER — ONDANSETRON HCL 4 MG/2ML IJ SOLN
INTRAMUSCULAR | Status: AC
  Filled 2017-12-21: qty 2

## 2017-12-21 MED ORDER — BUPIVACAINE HCL (PF) 0.25 % IJ SOLN
INTRAMUSCULAR | Status: DC | PRN
  Administered 2017-12-21: 30 mL

## 2017-12-21 MED ORDER — NON FORMULARY: 20.0000 mg | Freq: Every day | Status: DC

## 2017-12-21 MED ORDER — DEXAMETHASONE SODIUM PHOSPHATE 10 MG/ML IJ SOLN
10.0000 mg | Freq: Once | INTRAMUSCULAR | Status: AC
  Administered 2017-12-22: 10 mg via INTRAVENOUS
  Filled 2017-12-21: qty 1

## 2017-12-21 MED ORDER — FERROUS SULFATE 325 (65 FE) MG PO TABS: 325.0000 mg | ORAL_TABLET | Freq: Three times a day (TID) | ORAL | 3 refills | Status: DC

## 2017-12-21 MED ORDER — INSULIN ASPART 100 UNIT/ML ~~LOC~~ SOLN
0.0000 [IU] | Freq: Three times a day (TID) | SUBCUTANEOUS | Status: DC
  Administered 2017-12-21: 11 [IU] via SUBCUTANEOUS
  Administered 2017-12-22: 5 [IU] via SUBCUTANEOUS
  Administered 2017-12-22: 8 [IU] via SUBCUTANEOUS
  Administered 2017-12-22: 3 [IU] via SUBCUTANEOUS

## 2017-12-21 MED ORDER — PHENYLEPHRINE 40 MCG/ML (10ML) SYRINGE FOR IV PUSH (FOR BLOOD PRESSURE SUPPORT)
PREFILLED_SYRINGE | INTRAVENOUS | Status: DC | PRN
  Administered 2017-12-21 (×2): 80 ug via INTRAVENOUS

## 2017-12-21 MED ORDER — ONDANSETRON HCL 4 MG PO TABS: 4.0000 mg | ORAL_TABLET | Freq: Four times a day (QID) | ORAL | Status: DC | PRN

## 2017-12-21 MED ORDER — LIDOCAINE 2% (20 MG/ML) 5 ML SYRINGE
INTRAMUSCULAR | Status: AC
  Filled 2017-12-21: qty 5

## 2017-12-21 MED ORDER — POLYETHYLENE GLYCOL 3350 17 G PO PACK: 17.0000 g | PACK | Freq: Two times a day (BID) | ORAL | 0 refills | Status: DC

## 2017-12-21 MED ORDER — PROPOFOL 10 MG/ML IV BOLUS
INTRAVENOUS | Status: DC | PRN
  Administered 2017-12-21: 60 mg via INTRAVENOUS

## 2017-12-21 MED ORDER — SODIUM CHLORIDE 0.9 % IR SOLN
Status: DC | PRN
  Administered 2017-12-21: 1000 mL

## 2017-12-21 MED ORDER — HYDROCODONE-ACETAMINOPHEN 5-325 MG PO TABS
1.0000 | ORAL_TABLET | ORAL | Status: DC | PRN
  Administered 2017-12-21: 1 via ORAL
  Administered 2017-12-22 – 2017-12-23 (×5): 2 via ORAL
  Filled 2017-12-21 (×6): qty 2

## 2017-12-21 MED ORDER — METOCLOPRAMIDE HCL 5 MG/ML IJ SOLN: 5.0000 mg | Freq: Three times a day (TID) | INTRAMUSCULAR | Status: DC | PRN

## 2017-12-21 MED ORDER — CELECOXIB 200 MG PO CAPS
200.0000 mg | ORAL_CAPSULE | Freq: Two times a day (BID) | ORAL | Status: DC
  Administered 2017-12-21: 200 mg via ORAL
  Filled 2017-12-21: qty 1

## 2017-12-21 MED ORDER — FENTANYL CITRATE (PF) 100 MCG/2ML IJ SOLN: 25.0000 ug | INTRAMUSCULAR | Status: DC | PRN

## 2017-12-21 MED ORDER — CEFAZOLIN SODIUM-DEXTROSE 2-4 GM/100ML-% IV SOLN
2.0000 g | Freq: Four times a day (QID) | INTRAVENOUS | Status: AC
  Administered 2017-12-21 (×2): 2 g via INTRAVENOUS
  Filled 2017-12-21 (×2): qty 100

## 2017-12-21 MED ORDER — DIPHENHYDRAMINE HCL 12.5 MG/5ML PO ELIX: 12.5000 mg | ORAL_SOLUTION | ORAL | Status: DC | PRN

## 2017-12-21 MED ORDER — INSULIN LISPRO PROT & LISPRO (75-25 MIX) 100 UNIT/ML KWIKPEN: 40.0000 [IU] | PEN_INJECTOR | Freq: Two times a day (BID) | SUBCUTANEOUS | Status: DC

## 2017-12-21 MED ORDER — OXYCODONE HCL 5 MG/5ML PO SOLN: 5.0000 mg | Freq: Once | ORAL | Status: DC | PRN

## 2017-12-21 MED ORDER — DEXAMETHASONE SODIUM PHOSPHATE 10 MG/ML IJ SOLN
INTRAMUSCULAR | Status: AC
  Filled 2017-12-21: qty 1

## 2017-12-21 MED ORDER — METOCLOPRAMIDE HCL 5 MG PO TABS: 5.0000 mg | ORAL_TABLET | Freq: Three times a day (TID) | ORAL | Status: DC | PRN

## 2017-12-21 MED ORDER — TRANEXAMIC ACID-NACL 1000-0.7 MG/100ML-% IV SOLN
1000.0000 mg | Freq: Once | INTRAVENOUS | Status: AC
  Administered 2017-12-21: 1000 mg via INTRAVENOUS
  Filled 2017-12-21: qty 100

## 2017-12-21 MED ORDER — LIDOCAINE 2% (20 MG/ML) 5 ML SYRINGE
INTRAMUSCULAR | Status: DC | PRN
  Administered 2017-12-21: 100 mg via INTRAVENOUS

## 2017-12-21 MED ORDER — 0.9 % SODIUM CHLORIDE (POUR BTL) OPTIME
TOPICAL | Status: DC | PRN
  Administered 2017-12-21: 1000 mL

## 2017-12-21 MED ORDER — ACETAMINOPHEN 325 MG PO TABS: 325.0000 mg | ORAL_TABLET | Freq: Four times a day (QID) | ORAL | Status: DC | PRN

## 2017-12-21 MED ORDER — HYDROCODONE-ACETAMINOPHEN 7.5-325 MG PO TABS
1.0000 | ORAL_TABLET | ORAL | Status: DC | PRN
  Administered 2017-12-21: 2 via ORAL
  Administered 2017-12-22: 1 via ORAL
  Administered 2017-12-22 – 2017-12-24 (×6): 2 via ORAL
  Filled 2017-12-21 (×3): qty 2
  Filled 2017-12-21: qty 1
  Filled 2017-12-21 (×5): qty 2

## 2017-12-21 MED ORDER — HYDROCODONE-ACETAMINOPHEN 7.5-325 MG PO TABS: 1.0000 | ORAL_TABLET | ORAL | 0 refills | Status: DC | PRN

## 2017-12-21 MED ORDER — PROPOFOL 500 MG/50ML IV EMUL
INTRAVENOUS | Status: DC | PRN
  Administered 2017-12-21: 125 ug/kg/min via INTRAVENOUS

## 2017-12-21 MED ORDER — INSULIN ASPART PROT & ASPART (70-30 MIX) 100 UNIT/ML ~~LOC~~ SUSP
40.0000 [IU] | Freq: Two times a day (BID) | SUBCUTANEOUS | Status: DC
  Administered 2017-12-21 – 2017-12-23 (×4): 40 [IU] via SUBCUTANEOUS
  Filled 2017-12-21 (×2): qty 10

## 2017-12-21 MED ORDER — DOCUSATE SODIUM 100 MG PO CAPS
100.0000 mg | ORAL_CAPSULE | Freq: Two times a day (BID) | ORAL | Status: DC
  Administered 2017-12-21 – 2017-12-24 (×6): 100 mg via ORAL
  Filled 2017-12-21 (×6): qty 1

## 2017-12-21 MED ORDER — ASPIRIN 81 MG PO CHEW: 81.0000 mg | CHEWABLE_TABLET | Freq: Two times a day (BID) | ORAL | 0 refills | Status: AC

## 2017-12-21 MED ORDER — ONDANSETRON HCL 4 MG/2ML IJ SOLN
INTRAMUSCULAR | Status: DC | PRN
  Administered 2017-12-21: 4 mg via INTRAVENOUS

## 2017-12-21 MED ORDER — TRANEXAMIC ACID-NACL 1000-0.7 MG/100ML-% IV SOLN
1000.0000 mg | INTRAVENOUS | Status: AC
  Administered 2017-12-21: 1000 mg via INTRAVENOUS
  Filled 2017-12-21: qty 100

## 2017-12-21 MED ORDER — BUPIVACAINE HCL (PF) 0.25 % IJ SOLN
INTRAMUSCULAR | Status: AC
  Filled 2017-12-21: qty 30

## 2017-12-21 MED ORDER — METFORMIN HCL 500 MG PO TABS
1000.0000 mg | ORAL_TABLET | Freq: Two times a day (BID) | ORAL | Status: DC
  Administered 2017-12-21 – 2017-12-24 (×6): 1000 mg via ORAL
  Filled 2017-12-21 (×7): qty 2

## 2017-12-21 MED ORDER — BISACODYL 10 MG RE SUPP: 10.0000 mg | Freq: Every day | RECTAL | Status: DC | PRN

## 2017-12-21 MED ORDER — MENTHOL 3 MG MT LOZG: 1.0000 | LOZENGE | OROMUCOSAL | Status: DC | PRN

## 2017-12-21 MED ORDER — ALUM & MAG HYDROXIDE-SIMETH 200-200-20 MG/5ML PO SUSP: 15.0000 mL | ORAL | Status: DC | PRN

## 2017-12-21 MED ORDER — DEXAMETHASONE SODIUM PHOSPHATE 10 MG/ML IJ SOLN
10.0000 mg | Freq: Once | INTRAMUSCULAR | Status: AC
  Administered 2017-12-21: 10 mg via INTRAVENOUS

## 2017-12-21 MED ORDER — ASPIRIN 81 MG PO CHEW
81.0000 mg | CHEWABLE_TABLET | Freq: Two times a day (BID) | ORAL | Status: DC
  Administered 2017-12-21 – 2017-12-24 (×6): 81 mg via ORAL
  Filled 2017-12-21 (×6): qty 1

## 2017-12-21 SURGICAL SUPPLY — 54 items
ATTUNE MED ANAT PAT 38 KNEE (Knees) ×2 IMPLANT
ATTUNE MED ANAT PAT 38MM KNEE (Knees) ×1 IMPLANT
ATTUNE PS FEM LT SZ 4 CEM KNEE (Femur) ×3 IMPLANT
ATTUNE PSRP INSR SZ4 5 KNEE (Insert) ×2 IMPLANT
ATTUNE PSRP INSR SZ4 5MM KNEE (Insert) ×1 IMPLANT
BANDAGE ACE 6X5 VEL STRL LF (GAUZE/BANDAGES/DRESSINGS) ×3 IMPLANT
BASE TIBIA ATTUNE KNEE SYS SZ6 (Knees) ×1 IMPLANT
BLADE SAW SGTL 13.0X1.19X90.0M (BLADE) ×3 IMPLANT
BOWL SMART MIX CTS (DISPOSABLE) ×3 IMPLANT
CEMENT HV SMART SET (Cement) ×6 IMPLANT
COVER SURGICAL LIGHT HANDLE (MISCELLANEOUS) ×3 IMPLANT
COVER WAND RF STERILE (DRAPES) ×3 IMPLANT
CUFF TOURN SGL QUICK 34 (TOURNIQUET CUFF) ×2
CUFF TRNQT CYL 34X4X40X1 (TOURNIQUET CUFF) ×1 IMPLANT
DECANTER SPIKE VIAL GLASS SM (MISCELLANEOUS) ×6 IMPLANT
DERMABOND ADVANCED (GAUZE/BANDAGES/DRESSINGS) ×2
DERMABOND ADVANCED .7 DNX12 (GAUZE/BANDAGES/DRESSINGS) ×1 IMPLANT
DRAPE U-SHAPE 47X51 STRL (DRAPES) ×3 IMPLANT
DRESSING AQUACEL AG SP 3.5X10 (GAUZE/BANDAGES/DRESSINGS) ×1 IMPLANT
DRSG AQUACEL AG ADV 3.5X10 (GAUZE/BANDAGES/DRESSINGS) ×3 IMPLANT
DRSG AQUACEL AG SP 3.5X10 (GAUZE/BANDAGES/DRESSINGS) ×3
DURAPREP 26ML APPLICATOR (WOUND CARE) ×6 IMPLANT
ELECT REM PT RETURN 15FT ADLT (MISCELLANEOUS) ×3 IMPLANT
GLOVE BIOGEL M 7.0 STRL (GLOVE) IMPLANT
GLOVE BIOGEL PI IND STRL 7.5 (GLOVE) ×1 IMPLANT
GLOVE BIOGEL PI IND STRL 8.5 (GLOVE) ×1 IMPLANT
GLOVE BIOGEL PI INDICATOR 7.5 (GLOVE) ×2
GLOVE BIOGEL PI INDICATOR 8.5 (GLOVE) ×2
GLOVE ECLIPSE 8.0 STRL XLNG CF (GLOVE) ×3 IMPLANT
GLOVE ORTHO TXT STRL SZ7.5 (GLOVE) ×6 IMPLANT
GOWN STRL REUS W/TWL 2XL LVL3 (GOWN DISPOSABLE) ×3 IMPLANT
GOWN STRL REUS W/TWL LRG LVL3 (GOWN DISPOSABLE) ×3 IMPLANT
HANDPIECE INTERPULSE COAX TIP (DISPOSABLE) ×2
HOLDER FOLEY CATH W/STRAP (MISCELLANEOUS) ×3 IMPLANT
MANIFOLD NEPTUNE II (INSTRUMENTS) ×3 IMPLANT
NDL SAFETY ECLIPSE 18X1.5 (NEEDLE) IMPLANT
NEEDLE HYPO 18GX1.5 SHARP (NEEDLE)
PACK TOTAL KNEE CUSTOM (KITS) ×3 IMPLANT
PIN FIX SIGMA HP QUICK REL (PIN) ×3 IMPLANT
PIN THREADED HEADED SIGMA (PIN) ×3 IMPLANT
POSITIONER SURGICAL ARM (MISCELLANEOUS) ×3 IMPLANT
SET HNDPC FAN SPRY TIP SCT (DISPOSABLE) ×1 IMPLANT
SET PAD KNEE POSITIONER (MISCELLANEOUS) ×3 IMPLANT
SUT MNCRL AB 4-0 PS2 18 (SUTURE) ×3 IMPLANT
SUT STRATAFIX PDS+ 0 24IN (SUTURE) ×3 IMPLANT
SUT VIC AB 1 CT1 36 (SUTURE) ×3 IMPLANT
SUT VIC AB 2-0 CT1 27 (SUTURE) ×6
SUT VIC AB 2-0 CT1 TAPERPNT 27 (SUTURE) ×3 IMPLANT
SYRINGE 3CC LL L/F (MISCELLANEOUS) ×3 IMPLANT
TIBIA ATTUNE KNEE SYS BASE SZ6 (Knees) ×3 IMPLANT
TRAY FOLEY CATH 14FR (SET/KITS/TRAYS/PACK) ×3 IMPLANT
WATER STERILE IRR 1000ML POUR (IV SOLUTION) ×3 IMPLANT
WRAP KNEE MAXI GEL POST OP (GAUZE/BANDAGES/DRESSINGS) ×3 IMPLANT
YANKAUER SUCT BULB TIP 10FT TU (MISCELLANEOUS) ×3 IMPLANT

## 2017-12-21 NOTE — Anesthesia Procedure Notes (Signed)
Spinal  Patient location during procedure: OR Start time: 12/21/2017 10:01 AM Staffing Resident/CRNA: [place] Mialani Reicks C, CRNA Performed: resident/CRNA  Preanesthetic Checklist Completed: patient identified, site marked, surgical consent, pre-op evaluation, timeout performed, IV checked, risks and benefits discussed and monitors and equipment checked Spinal Block Patient position: sitting Prep: Betadine and site prepped and draped Patient monitoring: heart rate, continuous pulse ox and blood pressure Approach: midline Location: L3-4 Injection technique: single-shot Needle Needle type: Pencan  Needle gauge: 24 G Needle length: 9 cm Assessment Sensory level: T6 Additional Notes Expiration date of kit checked and confirmed. Patient tolerated procedure well, without complications.

## 2017-12-21 NOTE — Anesthesia Preprocedure Evaluation (Addendum)
Anesthesia Evaluation  Patient identified by MRN, date of birth, ID band Patient awake    Reviewed: Allergy & Precautions, NPO status , Patient's Chart, lab work & pertinent test results  History of Anesthesia Complications Negative for: history of anesthetic complications  Airway Mallampati: II  TM Distance: >3 FB Neck ROM: Full    Dental no notable dental hx.    Pulmonary neg pulmonary ROS, former smoker,    Pulmonary exam normal        Cardiovascular hypertension, Normal cardiovascular exam     Neuro/Psych negative neurological ROS  negative psych ROS   GI/Hepatic Neg liver ROS, PUD, GERD  ,  Endo/Other  diabetes, Insulin DependentHypothyroidism   Renal/GU negative Renal ROS  negative genitourinary   Musculoskeletal  (+) Arthritis , Osteoarthritis,    Abdominal   Peds  Hematology negative hematology ROS (+)   Anesthesia Other Findings   Reproductive/Obstetrics                            Anesthesia Physical Anesthesia Plan  ASA: III  Anesthesia Plan: Spinal   Post-op Pain Management:  [place] for Post-op pain   Induction:   PONV Risk Score and Plan: 2 and Propofol infusion  Airway Management Planned: Natural Airway, Nasal Cannula and Simple Face Mask  Additional Equipment: None  Intra-op Plan:   Post-operative Plan:   Informed Consent: I have reviewed the patients History and Physical, chart, labs and discussed the procedure including the risks, benefits and alternatives for the proposed anesthesia with the patient or authorized representative who has indicated his/her understanding and acceptance.     Plan Discussed with:   Anesthesia Plan Comments:        Anesthesia Quick Evaluation

## 2017-12-21 NOTE — Interval H&P Note (Signed)
History and Physical Interval Note:  12/21/2017 8:47 AM  Stacey Hanson  has presented today for surgery, with the diagnosis of Left knee osteoarthritis  The various methods of treatment have been discussed with the patient and family. After consideration of risks, benefits and other options for treatment, the patient has consented to  Procedure(s) with comments: LEFT TOTAL KNEE ARTHROPLASTY (Left) - 70 mins as a surgical intervention .  The patient's history has been reviewed, patient examined, no change in status, stable for surgery.  I have reviewed the patient's chart and labs.  Questions were answered to the patient's satisfaction.     [name]

## 2017-12-21 NOTE — Progress Notes (Signed)
AssistedDr. Witman with left, ultrasound guided, adductor canal block. Side rails up, monitors on throughout procedure. See vital signs in flow sheet. Tolerated Procedure well.  

## 2017-12-21 NOTE — Op Note (Signed)
NAME:  Stacey Hanson                      MEDICAL RECORD NO.:  [id number]                             FACILITY:  [facility]      PHYSICIAN:  Pietro Cassis. Alvan Dame, M.D.  DATE OF BIRTH:  Dec 23, 1945      DATE OF PROCEDURE:  12/21/2017                                     OPERATIVE REPORT         PREOPERATIVE DIAGNOSIS:  Left knee osteoarthritis.      POSTOPERATIVE DIAGNOSIS:  Left knee osteoarthritis.      FINDINGS:  The patient was noted to have complete loss of cartilage and   bone-on-bone arthritis with associated osteophytes in the lateral and patellofemoral compartments of   the knee with a significant synovitis and associated effusion.  The patient had failed months of conservative treatment including medications, injection therapy, activity modification.     PROCEDURE:  Left total knee replacement.      COMPONENTS USED:  DePuy Attune rotating platform posterior stabilized knee   system, a size 4N femur, 6 tibia, size 5 mm PS AOX insert, and 38 anatomic patellar   button.      SURGEON:  Pietro Cassis. Alvan Dame, M.D.      ASSISTANT:  Danae Orleans, PA-C.      ANESTHESIA:  Regional and Spinal.      SPECIMENS:  None.      COMPLICATION:  None.      DRAINS:  None.  EBL: <100cc      TOURNIQUET TIME:   Total Tourniquet Time Documented: Thigh (Left) - 5 minutes Thigh (Left) - 5 minutes Total: Thigh (Left) - 5 minutes  .      The patient was stable to the recovery room.      INDICATION FOR PROCEDURE:  Stacey Hanson is a 72 y.o. female patient of   mine.  The patient had been seen, evaluated, and treated for months conservatively in the   office with medication, activity modification, and injections.  The patient had   radiographic changes of bone-on-bone arthritis with endplate sclerosis and osteophytes noted.  Based on the radiographic changes and failed conservative measures, the patient   decided to proceed with definitive treatment, total knee replacement.  Risks of infection, DVT,  component failure, need for revision surgery, neurovascular injury were reviewed in the office setting.  The postop course was reviewed stressing the efforts to maximize post-operative satisfaction and function.  Consent was obtained for benefit of pain   relief.  Specific risks of peroneal nerve reviewed related to valgus knee with 10 degree flexion contracture     PROCEDURE IN DETAIL:  The patient was brought to the operative theater.   Once adequate anesthesia, preoperative antibiotics, 2 gm of Anecf,1 gm of Tranexamic Acid, and 10 mg of Decadron administered, the patient was positioned supine with a left thigh tourniquet placed.  The  left lower extremity was prepped and draped in sterile fashion.  A time-   out was performed identifying the patient, planned procedure, and the appropriate extremity.      The left lower extremity was placed in the [facility] leg holder.  The  leg was   exsanguinated, tourniquet elevated to 250 mmHg.  A midline incision was   made followed by median parapatellar arthrotomy.  Following initial   exposure, attention was first directed to the patella.  Precut   measurement was noted to be 24 mm.  I resected down to 14 mm and used a   38 anatomic patellar button to restore patellar height as well as cover the cut surface.      The lug holes were drilled and a metal shim was placed to protect the   patella from retractors and saw blade during the procedure.      At this point, attention was now directed to the femur.  The femoral   canal was opened with a drill, irrigated to try to prevent fat emboli.  An   intramedullary rod was passed at 5 degrees valgus, 10 mm of bone was   resected off the distal femur to pre-operative flexion contracture.  Following this resection, the tibia was   subluxated anteriorly.  Using the extramedullary guide, 2 mm of bone was resected off   the proximal lateral tibia, initially, then an additional 2 more mm based on extramedullary guide  (due to tight extension gap).  We confirmed the gap would be   stable medially and laterally with a size 5 spacer block as well as confirmed that the tibial cut was perpendicular in the coronal plane, checking with an alignment rod.      Once this was done, I sized the femur to be a size 4 in the anterior-   posterior dimension, chose a standard component based on medial and   lateral dimension.  The size 4 rotation block was then pinned in   position anterior referenced using the C-clamp to set rotation.  The   anterior, posterior, and  chamfer cuts were made without difficulty nor   notching making certain that I was along the anterior cortex to help   with flexion gap stability.      The final box cut was made off the lateral aspect of distal femur.      At this point, the tibia was sized to be a size 6.  The size 6 tray was   then pinned in position through the medial third of the tubercle,   drilled, and keel punched.  Trial reduction was now carried with a 4 femur,  6 tibia, a size 5 mm PS insert, and the 38 anatomic patella botton.  The knee was brought to full extension with good flexion stability with the patella   tracking through the trochlea without application of pressure.  Given   all these findings the trial components removed.  Final components were   opened and cement was mixed.  The knee was irrigated with normal saline solution and pulse lavage.  The synovial lining was   then injected with 30 cc of 0.25% Marcaine with epinephrine, 1 cc of Toradol and 30 cc of NS for a total of 61 cc.   Due to issues with the tourniquet it was let down for the whole case other than cementing.    Final implants were then cemented onto cleaned and dried cut surfaces of bone with the knee brought to extension with a size 5 mm PS trial insert.      Once the cement had fully cured, excess cement was removed   throughout the knee.  I confirmed that I was satisfied with the range of  motion  and stability, and the final size 5 mm PS AOX insert was chosen.  It was   placed into the knee.      The tourniquet had been let down at 5 minutes.  No significant   hemostasis was required.  The extensor mechanism was then reapproximated using #1 Vicryl and #1 Stratafix sutures with the knee   in flexion.  The   remaining wound was closed with 2-0 Vicryl and Zipeline closure system.   The knee was cleaned, dried, dressed sterilely using Dermabond and   Aquacel dressing.  The patient was then   brought to recovery room in stable condition, tolerating the procedure   well.   Please note that Physician Assistant, Danae Orleans, PA-C was present for the entirety of the case, and was utilized for pre-operative positioning, peri-operative retractor management, general facilitation of the procedure and for primary wound closure at the end of the case.              Pietro Cassis Alvan Dame, M.D.    12/21/2017 11:19 AM

## 2017-12-21 NOTE — Anesthesia Procedure Notes (Signed)
Anesthesia Regional Block: Adductor canal block   Pre-Anesthetic Checklist: ,, timeout performed, Correct Patient, Correct Site, Correct Laterality, Correct Procedure, Correct Position, site marked, Risks and benefits discussed,  Surgical consent,  Pre-op evaluation,  At surgeon's request and post-op pain management  Laterality: Left  Prep: chloraprep       Needles:  Injection technique: Single-shot  Needle Type: Echogenic Stimulator Needle     Needle Length: 9cm  Needle Gauge: 21     Additional Needles:   Procedures:,,,, ultrasound used (permanent image in chart),,,,  Narrative:  Start time: 12/21/2017 8:40 AM End time: 12/21/2017 8:48 AM Injection made incrementally with aspirations every 5 mL.  Performed by: Personally  Anesthesiologist: Lidia Collum, MD  Additional Notes: Monitors applied. Injection made in 5cc increments. No resistance to injection. Good needle visualization. Patient tolerated procedure well.

## 2017-12-21 NOTE — Transfer of Care (Signed)
Immediate Anesthesia Transfer of Care Note  Patient: Stacey Hanson  Procedure(s) Performed: LEFT TOTAL KNEE ARTHROPLASTY (Left Knee)  Patient Location: PACU  Anesthesia Type:Spinal  Level of Consciousness: awake, alert  and oriented  Airway & Oxygen Therapy: Patient Spontanous Breathing and Patient connected to face mask oxygen  Post-op Assessment: Report given to RN and Post -op Vital signs reviewed and stable  Post vital signs: Reviewed and stable  Last Vitals:  Vitals Value Taken Time  BP 111/80 12/21/2017 11:50 AM  Temp    Pulse 76 12/21/2017 11:52 AM  Resp 16 12/21/2017 11:52 AM  SpO2 100 % 12/21/2017 11:52 AM  Vitals shown include unvalidated device data.  Last Pain:  Vitals:   12/21/17 0750  TempSrc:   PainSc: 0-No pain         Complications: No apparent anesthesia complications

## 2017-12-21 NOTE — Anesthesia Postprocedure Evaluation (Signed)
Anesthesia Post Note  Patient: Stacey Hanson  Procedure(s) Performed: LEFT TOTAL KNEE ARTHROPLASTY (Left Knee)     Patient location during evaluation: PACU Anesthesia Type: Spinal Level of consciousness: oriented and awake and alert Pain management: pain level controlled Vital Signs Assessment: post-procedure vital signs reviewed and stable Respiratory status: spontaneous breathing, respiratory function stable and patient connected to nasal cannula oxygen Cardiovascular status: blood pressure returned to baseline and stable Postop Assessment: no headache, no backache, no apparent nausea or vomiting and spinal receding Anesthetic complications: no    Last Vitals:  Vitals:   12/21/17 1458 12/21/17 1605  BP: (!) 156/82 (!) 143/83  Pulse: 72 85  Resp: 16 16  Temp: 36.6 C 36.6 C  SpO2: 100% 100%    Last Pain:  Vitals:   12/21/17 1605  TempSrc: Oral  PainSc:                  Lidia Collum

## 2017-12-22 ENCOUNTER — Encounter (HOSPITAL_COMMUNITY): Payer: Self-pay | Admitting: Orthopedic Surgery

## 2017-12-22 LAB — BASIC METABOLIC PANEL
Anion gap: 10 (ref 5–15)
BUN: 28 mg/dL — AB (ref 8–23)
CO2: 22 mmol/L (ref 22–32)
Calcium: 8.8 mg/dL — ABNORMAL LOW (ref 8.9–10.3)
Chloride: 102 mmol/L (ref 98–111)
Creatinine, Ser: 1.06 mg/dL — ABNORMAL HIGH (ref 0.44–1.00)
GFR calc Af Amer: 59 mL/min — ABNORMAL LOW (ref >60)
GFR calc non Af Amer: 51 mL/min — ABNORMAL LOW (ref >60)
GLUCOSE: 196 mg/dL — AB (ref 70–99)
POTASSIUM: 5 mmol/L (ref 3.5–5.1)
Sodium: 134 mmol/L — ABNORMAL LOW (ref 135–145)

## 2017-12-22 LAB — CBC
HCT: 34.9 % — ABNORMAL LOW (ref 36.0–46.0)
Hemoglobin: 10.9 g/dL — ABNORMAL LOW (ref 12.0–15.0)
MCH: 29.9 pg (ref 26.0–34.0)
MCHC: 31.2 g/dL (ref 30.0–36.0)
MCV: 95.6 fL (ref 80.0–100.0)
NRBC: 0 % (ref 0.0–0.2)
PLATELETS: 391 10*3/uL (ref 150–400)
RBC: 3.65 MIL/uL — ABNORMAL LOW (ref 3.87–5.11)
RDW: 13.3 % (ref 11.5–15.5)
WBC: 14.3 10*3/uL — AB (ref 4.0–10.5)

## 2017-12-22 LAB — GLUCOSE, CAPILLARY
Glucose-Capillary: 171 mg/dL — ABNORMAL HIGH (ref 70–99)
Glucose-Capillary: 215 mg/dL — ABNORMAL HIGH (ref 70–99)
Glucose-Capillary: 279 mg/dL — ABNORMAL HIGH (ref 70–99)

## 2017-12-22 NOTE — Progress Notes (Addendum)
CSW assessment complete, waiting for PT evaluation and recommendations for FL-2. Patient referred for SNF, would prefer [facility].  PASRR: [id number]   Patient has been accepted by her preferred SNF. CSW reached out to [employer] to begin insurance authorization, FL-2 faxed to [employer] as well.   Stephanie Acre, [place] Social Worker [phone]

## 2017-12-22 NOTE — Clinical Social Work Note (Signed)
Clinical Social Work Assessment  Patient Details  Name: Stacey Hanson MRN: [id number] Date of Birth: 11/15/1945  Date of referral:  12/22/17               Reason for consult:  Facility Placement                Permission sought to share information with:  Facility Art therapist granted to share information::  Yes, Verbal Permission Granted  Name::        Agency::     Relationship::     Contact Information:     Housing/Transportation Living arrangements for the past 2 months:  Apartment Source of Information:  Patient Patient Interpreter Needed:  None Criminal Activity/Legal Involvement Pertinent to Current Situation/Hospitalization:  No - Comment as needed Significant Relationships:  Adult Children Lives with:  Self Do you feel safe going back to the place where you live?  Yes Need for family participation in patient care:  No (Coment)  Care giving concerns:  Danniel Grenz, 72 y.o. female, has a history of pain and functional disability in the left knee due to arthritis. The patient underwent surgery for a total left knee arthroplasty on 12/21/17. Social work consult for SNF placement.  Social Worker assessment / plan:   CSW met with patient at bedside to introduce self and role, complete assessment, and determine patient's agreeableness to SNF.   Patient lives alone in [place] in an apartment. She reports that she has three adult sons in [place], one of whom is married. She identifies her sons and her daughter in law as her primary supports but states they would not be able to provide the level of assistance she may require as she recovers from surgery. The patient is retired, sharing that she worked at [employer] for greater than 20 years.  The patient reports high levels of independence prior to surgery. She is able to ambulate without assistive devices and she is able to drive herself to appointments and run errands.   Employment status:   Retired Nurse, adult PT Recommendations:  Not assessed at this time Information / Referral to community resources:  [place]  Patient/Family's Response to care:   Patient was pleasant and appreciative of CSW intervention.   Patient/Family's Understanding of and Emotional Response to Diagnosis, Current Treatment, and Prognosis:  The patient is agreeable to SNF, and would most prefer [facility]. Patient has no additional questions for CSW at this time.  Emotional Assessment Appearance:  Appears stated age Attitude/Demeanor/Rapport:    Affect (typically observed):  Pleasant, Accepting, Hopeful Orientation:  Oriented to Self, Oriented to Place, Oriented to  Time, Oriented to Situation Alcohol / Substance use:    Psych involvement (Current and /or in the community):  No (Comment)  Discharge Needs  Concerns to be addressed:  Care Coordination Readmission within the last 30 days:  No Current discharge risk:  None Barriers to Discharge:  Continued Medical Work up, [place], [place] 12/22/2017, 10:16 AM

## 2017-12-22 NOTE — NC FL2 (Signed)
[place] LEVEL OF CARE SCREENING TOOL     IDENTIFICATION  Patient Name: Stacey Hanson Birthdate: [birth year] Sex: female Admission Date (Current Location): 12/21/2017  [facility] and [place] Number:  Herbalist and Address:  [facility],  [place], [place]      Provider Number: [id number]  Attending Physician Name and Address:  Paralee Cancel, MD  Relative Name and Phone Number:  Onetha Gaffey, son, [phone]    Current Level of Care: Hospital Recommended Level of Care: [place] Prior Approval Number:    Date Approved/Denied:   PASRR Number: [id number]  Discharge Plan: SNF    Current Diagnoses: Patient Active Problem List   Diagnosis Date Noted  . S/P left TKA 12/21/2017  . S/P total knee replacement 12/21/2017  . Need for immunization against influenza 11/14/2017  . Pre-operative clearance 11/14/2017  . Encounter for screening colonoscopy 07/07/2017  . Back pain 04/30/2015  . Seasonal allergies 02/21/2015  . Annual physical exam 09/27/2014  . Osteopenia 06/20/2013  . Hypothyroidism, postradioiodine therapy 12/28/2010  . GERD (gastroesophageal reflux disease) 09/22/2010  . DEGENERATIVE JOINT DISEASE, KNEES, BILATERAL 04/14/2007  . Diabetes mellitus, insulin dependent (IDDM), controlled ([place]) 03/31/2007  . Hyperlipidemia 03/31/2007  . Obesity 03/31/2007  . ESSENTIAL HYPERTENSION, BENIGN 03/31/2007    Orientation RESPIRATION BLADDER Height & Weight     Self, Time, Situation, Place  Normal Continent Weight:   Height:     BEHAVIORAL SYMPTOMS/MOOD NEUROLOGICAL BOWEL NUTRITION STATUS      Continent    AMBULATORY STATUS COMMUNICATION OF NEEDS Skin   Extensive Assist Verbally Surgical wounds(Total left knee arthroplasty)                       Personal Care Assistance Level of Assistance  Bathing, Feeding, Dressing Bathing Assistance: Maximum assistance Feeding assistance:  Independent Dressing Assistance: Maximum assistance     Functional Limitations Info             SPECIAL CARE FACTORS FREQUENCY  PT (By licensed PT), OT (By licensed OT)    7x weekly  7x weekly                Contractures Contractures Info: Not present    Additional Factors Info  Code Status, Allergies Code Status Info: Full Allergies Info: Pravastatin, Simvastatin           Current Medications (12/22/2017):  This is the current hospital active medication list Current Facility-Administered Medications  Medication Dose Route Frequency Provider Last Rate Last Dose  . 0.9 %  sodium chloride infusion   Intravenous Continuous Danae Orleans, PA-C 75 mL/hr at 12/22/17 7322    . acetaminophen (TYLENOL) tablet 325-650 mg  325-650 mg Oral Q6H PRN Danae Orleans, PA-C      . alum & mag hydroxide-simeth (MAALOX/MYLANTA) 200-200-20 MG/5ML suspension 15 mL  15 mL Oral Q4H PRN Danae Orleans, PA-C      . aspirin chewable tablet 81 mg  81 mg Oral BID Danae Orleans, PA-C   81 mg at 12/22/17 0939  . bisacodyl (DULCOLAX) suppository 10 mg  10 mg Rectal Daily PRN Babish, Matthew, PA-C      . diphenhydrAMINE (BENADRYL) 12.5 MG/5ML elixir 12.5-25 mg  12.5-25 mg Oral Q4H PRN Babish, Matthew, PA-C      . docusate sodium (COLACE) capsule 100 mg  100 mg Oral BID Babish, Matthew, PA-C   100 mg at 12/22/17 0932  . ferrous sulfate tablet 325  mg  325 mg Oral BID WC Danae Orleans, PA-C   325 mg at 12/22/17 0932  . HYDROcodone-acetaminophen (NORCO) 7.5-325 MG per tablet 1-2 tablet  1-2 tablet Oral Q4H PRN Danae Orleans, PA-C   2 tablet at 12/22/17 0086  . HYDROcodone-acetaminophen (NORCO/VICODIN) 5-325 MG per tablet 1-2 tablet  1-2 tablet Oral Q4H PRN Danae Orleans, PA-C   1 tablet at 12/21/17 1631  . HYDROmorphone (DILAUDID) injection 0.5-1 mg  0.5-1 mg Intravenous Q2H PRN Danae Orleans, PA-C      . insulin aspart (novoLOG) injection 0-15 Units  0-15 Units Subcutaneous TID WC Danae Orleans, PA-C   3 Units at 12/22/17 7619  . insulin aspart protamine- aspart (NOVOLOG MIX 70/30) injection 40 Units  40 Units Subcutaneous BID WC Paralee Cancel, MD   40 Units at 12/22/17 [phone]  . levothyroxine (SYNTHROID, LEVOTHROID) tablet 88 mcg  88 mcg Oral QAC breakfast Danae Orleans, PA-C   88 mcg at 12/22/17 0529  . magnesium citrate solution 1 Bottle  1 Bottle Oral Once PRN Babish, Matthew, PA-C      . menthol-cetylpyridinium (CEPACOL) lozenge 3 mg  1 lozenge Oral PRN Danae Orleans, PA-C       Or  . phenol (CHLORASEPTIC) mouth spray 1 spray  1 spray Mouth/Throat PRN Danae Orleans, PA-C      . metFORMIN (GLUCOPHAGE) tablet 1,000 mg  1,000 mg Oral BID WC Danae Orleans, PA-C   1,000 mg at 12/22/17 0931  . methocarbamol (ROBAXIN) tablet 500 mg  500 mg Oral Q6H PRN Danae Orleans, PA-C   500 mg at 12/22/17 2671   Or  . methocarbamol (ROBAXIN) 500 mg in dextrose 5 % 50 mL IVPB  500 mg Intravenous Q6H PRN Babish, Matthew, PA-C      . metoCLOPramide (REGLAN) tablet 5-10 mg  5-10 mg Oral Q8H PRN Danae Orleans, PA-C       Or  . metoCLOPramide (REGLAN) injection 5-10 mg  5-10 mg Intravenous Q8H PRN Babish, Matthew, PA-C      . omeprazole (PRILOSEC) capsule 20 mg  20 mg Oral Daily Paralee Cancel, MD   20 mg at 12/22/17 0932  . ondansetron (ZOFRAN) tablet 4 mg  4 mg Oral Q6H PRN Danae Orleans, PA-C       Or  . ondansetron (ZOFRAN) injection 4 mg  4 mg Intravenous Q6H PRN Babish, Matthew, PA-C      . polyethylene glycol (MIRALAX / GLYCOLAX) packet 17 g  17 g Oral BID Danae Orleans, PA-C   17 g at 12/22/17 0931  . pravastatin (PRAVACHOL) tablet 40 mg  40 mg Oral Daily Danae Orleans, PA-C   40 mg at 12/22/17 2458     Discharge Medications: Please see discharge summary for a list of discharge medications.  Relevant Imaging Results:  Relevant Lab Results:   Additional Information SSN: [id number]  Joellen [place], [place]

## 2017-12-22 NOTE — Progress Notes (Signed)
Physical Therapy Treatment Patient Details Name: Stacey Hanson MRN: [id number] DOB: 12/26/1945 Today's Date: 12/22/2017    History of Present Illness Pt s/p L TKR and hx of dm    PT Comments    Pt progressing slowly but continues to fatigue easily and is further limited in transitions 2* lack of ROM on non-operative LE.   Follow Up Recommendations  SNF     Equipment Recommendations  None recommended by PT    Recommendations for Other Services       Precautions / Restrictions Precautions Precautions: Fall;Knee Restrictions Weight Bearing Restrictions: No Other Position/Activity Restrictions: WBAT    Mobility  Bed Mobility               General bed mobility comments: Pt up in chair and requests back to same  Transfers Overall transfer level: Needs assistance Equipment used: Rolling walker (2 wheeled) Transfers: Sit to/from Stand Sit to Stand: Min assist;Mod assist;+2 physical assistance;+2 safety/equipment         General transfer comment: cues for LE management and use of UEs to self assist  Ambulation/Gait Ambulation/Gait assistance: Min assist;Mod assist;+2 physical assistance;+2 safety/equipment Gait Distance (Feet): 24 Feet Assistive device: Rolling walker (2 wheeled) Gait Pattern/deviations: Step-to pattern;Decreased step length - right;Decreased step length - left;Shuffle;Trunk flexed Gait velocity: decr   General Gait Details: cues for sequence, posture, increased UE WB and position from [employer]             Wheelchair Mobility    Modified Rankin (Stroke Patients Only)       Balance Overall balance assessment: Needs assistance Sitting-balance support: No upper extremity supported;Feet supported Sitting balance-Leahy Scale: Good     Standing balance support: Bilateral upper extremity supported Standing balance-Leahy Scale: Poor                              Cognition Arousal/Alertness: Awake/alert Behavior  During Therapy: WFL for tasks assessed/performed Overall Cognitive Status: Within Functional Limits for tasks assessed                                        Exercises      General Comments        Pertinent Vitals/Pain Pain Assessment: 0-10 Pain Score: 5  Pain Location: L knee Pain Descriptors / Indicators: Aching;Sore Pain Intervention(s): Limited activity within patient's tolerance;Monitored during session;Premedicated before session;Ice applied    Home Living                      Prior Function            PT Goals (current goals can now be found in the care plan section) Acute Rehab PT Goals Patient Stated Goal: Regain IND PT Goal Formulation: With patient Time For Goal Achievement: 12/29/17 Potential to Achieve Goals: Good Progress towards PT goals: Progressing toward goals    Frequency    7X/week      PT Plan Current plan remains appropriate    Co-evaluation              AM-PAC PT "6 Clicks" Daily Activity  Outcome Measure  Difficulty turning over in bed (including adjusting bedclothes, sheets and blankets)?: Unable Difficulty moving from lying on back to sitting on the side of the bed? : Unable Difficulty sitting down on and standing up  from a chair with arms (e.g., wheelchair, bedside commode, etc,.)?: Unable Help needed moving to and from a bed to chair (including a wheelchair)?: A Lot Help needed walking in hospital room?: A Lot Help needed climbing 3-5 steps with a railing? : A Lot 6 Click Score: 9    End of Session Equipment Utilized During Treatment: Gait belt Activity Tolerance: Patient limited by fatigue;Patient limited by pain Patient left: in chair;with call bell/phone within reach;with chair alarm set Nurse Communication: Mobility status PT Visit Diagnosis: Difficulty in walking, not elsewhere classified (R26.2)     Time: 2500-3704 PT Time Calculation (min) (ACUTE ONLY): 21 min  Charges:  $Gait  Training: 8-22 mins                     [place] Pager [phone] Office [phone]    Antonie Borjon 12/22/2017, 4:38 PM

## 2017-12-22 NOTE — Progress Notes (Signed)
     Subjective: 1 Day Post-Op Procedure(s) (LRB): LEFT TOTAL KNEE ARTHROPLASTY (Left)   Patient reports pain as moderate.  No events throughout the night. Discussed that she doesn't really have anyone at home that can assist her and the need for SNF.  Anticipated LOS equal to or greater than 2 midnights due to - Age 72 and older with one or more of the following:  - Obesity  - Expected need for hospital services (PT, OT, Nursing) required for safe discharge  - Anticipated need for postoperative skilled nursing care or inpatient rehab  - Active co-morbidities: Diabetes    Objective:   VITALS:   Vitals:   12/22/17 0245 12/22/17 0600  BP: (!) 140/115 (!) 142/92  Pulse: 85 81  Resp: 18 18  Temp: 98.3 F (36.8 C) 97.9 F (36.6 C)  SpO2: 100% 100%    Dorsiflexion/Plantar flexion intact Incision: dressing C/D/I No cellulitis present Compartment soft  LABS Recent Labs    12/22/17 0517  HGB 10.9*  HCT 34.9*  WBC 14.3*  PLT 391    Recent Labs    12/22/17 0517  NA 134*  K 5.0  BUN 28*  CREATININE 1.06*  GLUCOSE 196*     Assessment/Plan: 1 Day Post-Op Procedure(s) (LRB): LEFT TOTAL KNEE ARTHROPLASTY (Left) Foley cath d/c'ed Advance diet Up with therapy D/C IV fluids Discharge to SNF eventually, when ready  Obese (BMI 30-39.9)  Estimated body mass index is 30.27 kg/m as calculated from the following:   Height as of 12/13/17: 5\' 9"  (1.753 m).   Weight as of 12/13/17: 93 kg. Patient also counseled that weight may inhibit the healing process Patient counseled that losing weight will help with future health issues     [place] Pugh. Aydin Cavalieri   PAC  12/22/2017, 7:45 AM

## 2017-12-22 NOTE — Evaluation (Signed)
Physical Therapy Evaluation Patient Details Name: Stacey Hanson MRN: [id number] DOB: Jan 15, 1946 Today's Date: 12/22/2017   History of Present Illness  Pt s/p L TKR and hx of dm  Clinical Impression  Pt s/p L TKR and presents with decreased L LE strength/ROM; decreased R knee ROM, and post op pain limiting functional mobility.  Pt would benefit from follow up rehab at SNF level to maximize IND and safety prior to return home alone.    Follow Up Recommendations SNF    Equipment Recommendations  None recommended by PT    Recommendations for Other Services       Precautions / Restrictions Precautions Precautions: Fall;Knee Restrictions Weight Bearing Restrictions: No Other Position/Activity Restrictions: WBAT      Mobility  Bed Mobility Overal bed mobility: Needs Assistance Bed Mobility: Supine to Sit     Supine to sit: Mod assist     General bed mobility comments: cues for sequence and physical assist to manage L LE and to bring trunk to upright position  Transfers Overall transfer level: Needs assistance Equipment used: Rolling walker (2 wheeled) Transfers: Sit to/from Stand Sit to Stand: Min assist;Mod assist;+2 physical assistance;+2 safety/equipment;From elevated surface         General transfer comment: cues for LE management and use of UEs to self assist  Ambulation/Gait Ambulation/Gait assistance: Min assist;Mod assist;+2 physical assistance;+2 safety/equipment Gait Distance (Feet): 6 Feet Assistive device: Rolling walker (2 wheeled) Gait Pattern/deviations: Step-to pattern;Decreased step length - right;Decreased step length - left;Shuffle;Trunk flexed Gait velocity: decr   General Gait Details: cues for sequence, posture and position from [employer]            Wheelchair Mobility    Modified Rankin (Stroke Patients Only)       Balance Overall balance assessment: Needs assistance Sitting-balance support: No upper extremity supported;Feet  supported Sitting balance-Leahy Scale: Good     Standing balance support: Bilateral upper extremity supported Standing balance-Leahy Scale: Poor                               Pertinent Vitals/Pain Pain Assessment: 0-10 Pain Score: 6  Pain Location: L knee Pain Descriptors / Indicators: Aching;Sore Pain Intervention(s): Limited activity within patient's tolerance;Monitored during session;Premedicated before session;Ice applied    Home Living Family/patient expects to be discharged to:: Skilled nursing facility                      Prior Function Level of Independence: Independent;Independent with assistive device(s)               Hand Dominance        Extremity/Trunk Assessment   Upper Extremity Assessment Upper Extremity Assessment: Overall WFL for tasks assessed    Lower Extremity Assessment Lower Extremity Assessment: LLE deficits/detail;RLE deficits/detail RLE Deficits / Details: Knee flexion ltd to ~ 70 LLE Deficits / Details: 3/5 quads with IND SLR; AAROM at knee -12 - 40       Communication   Communication: No difficulties  Cognition Arousal/Alertness: Awake/alert Behavior During Therapy: WFL for tasks assessed/performed Overall Cognitive Status: Within Functional Limits for tasks assessed                                        General Comments      Exercises Total Joint Exercises Ankle Circles/Pumps:  AROM;Both;20 reps;Supine Quad Sets: AROM;Both;10 reps;Supine Heel Slides: AAROM;Left;15 reps;Supine Straight Leg Raises: AAROM;AROM;Left;15 reps;Supine   Assessment/Plan    PT Assessment Patient needs continued PT services  PT Problem List Decreased strength;Decreased range of motion;Decreased activity tolerance;Decreased balance;Decreased mobility;Decreased knowledge of use of DME;Pain       PT Treatment Interventions DME instruction;Gait training;Stair training;Functional mobility training;Therapeutic  activities;Therapeutic exercise;Patient/family education    PT Goals (Current goals can be found in the Care Plan section)  Acute Rehab PT Goals Patient Stated Goal: Regain IND PT Goal Formulation: With patient Time For Goal Achievement: 12/29/17 Potential to Achieve Goals: Good    Frequency 7X/week   Barriers to discharge        Co-evaluation               AM-PAC PT "6 Clicks" Daily Activity  Outcome Measure Difficulty turning over in bed (including adjusting bedclothes, sheets and blankets)?: Unable Difficulty moving from lying on back to sitting on the side of the bed? : Unable Difficulty sitting down on and standing up from a chair with arms (e.g., wheelchair, bedside commode, etc,.)?: Unable Help needed moving to and from a bed to chair (including a wheelchair)?: A Lot Help needed walking in hospital room?: A Lot Help needed climbing 3-5 steps with a railing? : A Lot 6 Click Score: 9    End of Session Equipment Utilized During Treatment: Gait belt Activity Tolerance: Patient limited by fatigue;Patient limited by pain Patient left: in chair;with call bell/phone within reach;with chair alarm set Nurse Communication: Mobility status PT Visit Diagnosis: Difficulty in walking, not elsewhere classified (R26.2)    Time: 7517-0017 PT Time Calculation (min) (ACUTE ONLY): 33 min   Charges:   PT Evaluation $PT Eval Low Complexity: 1 Low PT Treatments $Therapeutic Exercise: 8-22 mins        Debe Coder PT Acute Rehabilitation Services Pager [phone] Office [phone]   Timberlynn Kizziah 12/22/2017, 12:41 PM

## 2017-12-23 LAB — CBC
HCT: 27.6 % — ABNORMAL LOW (ref 36.0–46.0)
Hemoglobin: 8.8 g/dL — ABNORMAL LOW (ref 12.0–15.0)
MCH: 30 pg (ref 26.0–34.0)
MCHC: 31.9 g/dL (ref 30.0–36.0)
MCV: 94.2 fL (ref 80.0–100.0)
NRBC: 0 % (ref 0.0–0.2)
PLATELETS: 359 10*3/uL (ref 150–400)
RBC: 2.93 MIL/uL — ABNORMAL LOW (ref 3.87–5.11)
RDW: 13.5 % (ref 11.5–15.5)
WBC: 10.5 10*3/uL (ref 4.0–10.5)

## 2017-12-23 LAB — BASIC METABOLIC PANEL
Anion gap: 11 (ref 5–15)
BUN: 34 mg/dL — AB (ref 8–23)
CO2: 22 mmol/L (ref 22–32)
CREATININE: 1.07 mg/dL — AB (ref 0.44–1.00)
Calcium: 8.7 mg/dL — ABNORMAL LOW (ref 8.9–10.3)
Chloride: 105 mmol/L (ref 98–111)
GFR, EST AFRICAN AMERICAN: 59 mL/min — AB (ref >60)
GFR, EST NON AFRICAN AMERICAN: 51 mL/min — AB (ref >60)
Glucose, Bld: 127 mg/dL — ABNORMAL HIGH (ref 70–99)
POTASSIUM: 4.4 mmol/L (ref 3.5–5.1)
SODIUM: 138 mmol/L (ref 135–145)

## 2017-12-23 LAB — GLUCOSE, CAPILLARY
GLUCOSE-CAPILLARY: 123 mg/dL — AB (ref 70–99)
GLUCOSE-CAPILLARY: 68 mg/dL — AB (ref 70–99)
GLUCOSE-CAPILLARY: 68 mg/dL — AB (ref 70–99)
GLUCOSE-CAPILLARY: 88 mg/dL (ref 70–99)
Glucose-Capillary: 62 mg/dL — ABNORMAL LOW (ref 70–99)
Glucose-Capillary: 111 mg/dL — ABNORMAL HIGH (ref 70–99)
Glucose-Capillary: 111 mg/dL — ABNORMAL HIGH (ref 70–99)

## 2017-12-23 MED ORDER — INSULIN ASPART PROT & ASPART (70-30 MIX) 100 UNIT/ML ~~LOC~~ SUSP
35.0000 [IU] | Freq: Two times a day (BID) | SUBCUTANEOUS | Status: DC
  Administered 2017-12-23 – 2017-12-24 (×2): 35 [IU] via SUBCUTANEOUS

## 2017-12-23 NOTE — Progress Notes (Signed)
Per [facility] insurance Josem Kaufmann is still pending. Patient cannot discharge until auth is received. CSW will keep medical staff updated.   Pricilla Holm, MSW, [facility] [phone] (Weekend)

## 2017-12-23 NOTE — Progress Notes (Signed)
Physical Therapy Treatment Patient Details Name: Stacey Hanson MRN: [id number] DOB: 10/14/1945 Today's Date: 12/23/2017    History of Present Illness Pt s/p L TKR and hx of dm    PT Comments    Pt continues cooperative and progressing steadily with mobility and ROM/strengthening.   Follow Up Recommendations  SNF     Equipment Recommendations  None recommended by PT    Recommendations for Other Services       Precautions / Restrictions Precautions Precautions: Fall;Knee Restrictions Weight Bearing Restrictions: No Other Position/Activity Restrictions: WBAT    Mobility  Bed Mobility Overal bed mobility: Needs Assistance Bed Mobility: Supine to Sit     Supine to sit: Min assist     General bed mobility comments: Increased time with use of rail  Transfers Overall transfer level: Needs assistance Equipment used: Rolling walker (2 wheeled) Transfers: Sit to/from Stand Sit to Stand: Min assist;+2 physical assistance;+2 safety/equipment;From elevated surface         General transfer comment: cues for LE management and use of UEs to self assist  Ambulation/Gait Ambulation/Gait assistance: Min assist;+2 physical assistance;+2 safety/equipment Gait Distance (Feet): 33 Feet Assistive device: Rolling walker (2 wheeled) Gait Pattern/deviations: Step-to pattern;Decreased step length - right;Decreased step length - left;Shuffle;Trunk flexed Gait velocity: decr   General Gait Details: cues for sequence, posture, increased UE WB and position from [employer]             Wheelchair Mobility    Modified Rankin (Stroke Patients Only)       Balance Overall balance assessment: Needs assistance Sitting-balance support: No upper extremity supported;Feet supported Sitting balance-Leahy Scale: Good     Standing balance support: Bilateral upper extremity supported Standing balance-Leahy Scale: Poor                              Cognition  Arousal/Alertness: Awake/alert Behavior During Therapy: WFL for tasks assessed/performed Overall Cognitive Status: Within Functional Limits for tasks assessed                                        Exercises Total Joint Exercises Ankle Circles/Pumps: AROM;Both;20 reps;Supine Quad Sets: AROM;Both;Supine;15 reps Heel Slides: AAROM;Left;15 reps;Supine Straight Leg Raises: AAROM;AROM;Left;Supine;20 reps Goniometric ROM: AAROM L knee -10- 45    General Comments        Pertinent Vitals/Pain Pain Assessment: 0-10 Pain Score: 5  Pain Location: L knee Pain Descriptors / Indicators: Aching;Sore Pain Intervention(s): Limited activity within patient's tolerance;Monitored during session;Premedicated before session;Ice applied    Home Living                      Prior Function            PT Goals (current goals can now be found in the care plan section) Acute Rehab PT Goals Patient Stated Goal: Regain IND PT Goal Formulation: With patient Time For Goal Achievement: 12/29/17 Potential to Achieve Goals: Good Progress towards PT goals: Progressing toward goals    Frequency    7X/week      PT Plan Current plan remains appropriate    Co-evaluation              AM-PAC PT "6 Clicks" Daily Activity  Outcome Measure  Difficulty turning over in bed (including adjusting bedclothes, sheets and blankets)?: Unable Difficulty moving from  lying on back to sitting on the side of the bed? : Unable Difficulty sitting down on and standing up from a chair with arms (e.g., wheelchair, bedside commode, etc,.)?: Unable Help needed moving to and from a bed to chair (including a wheelchair)?: A Lot Help needed walking in hospital room?: A Lot Help needed climbing 3-5 steps with a railing? : A Lot 6 Click Score: 9    End of Session Equipment Utilized During Treatment: Gait belt Activity Tolerance: Patient limited by fatigue;Patient tolerated treatment  well Patient left: in chair;with call bell/phone within reach;with chair alarm set Nurse Communication: Mobility status PT Visit Diagnosis: Difficulty in walking, not elsewhere classified (R26.2)     Time: 1219-7588 PT Time Calculation (min) (ACUTE ONLY): 38 min  Charges:  $Gait Training: 8-22 mins $Therapeutic Exercise: 8-22 mins $Therapeutic Activity: 8-22 mins                     [place] Pager [phone] Office [phone]    Tadd Holtmeyer 12/23/2017, 10:58 AM

## 2017-12-23 NOTE — Progress Notes (Signed)
Patient ID: Stacey Hanson, female   DOB: [birth year], 72 y.o.   MRN: [id number] Subjective: 2 Days Post-Op Procedure(s) (LRB): LEFT TOTAL KNEE ARTHROPLASTY (Left)    Patient reports pain as mild to moderate.  Doing therapy this am at visit No events reported  Objective:   VITALS:   Vitals:   12/23/17 0533 12/23/17 1000  BP: (!) 151/69 125/61  Pulse: 89 98  Resp: 17 20  Temp: 98.1 F (36.7 C) 98.6 F (37 C)  SpO2: 100% 100%    Neurovascular intact Incision: dressing C/D/I  LABS Recent Labs    12/22/17 0517 12/23/17 0502  HGB 10.9* 8.8*  HCT 34.9* 27.6*  WBC 14.3* 10.5  PLT 391 359    Recent Labs    12/22/17 0517 12/23/17 0502  NA 134* 138  K 5.0 4.4  BUN 28* 34*  CREATININE 1.06* 1.07*  GLUCOSE 196* 127*    No results for input(s): LABPT, INR in the last 72 hours.   Assessment/Plan: 2 Days Post-Op Procedure(s) (LRB): LEFT TOTAL KNEE ARTHROPLASTY (Left)   Up with therapy Discharge to SNF when able due to insurance issues Reviewed goals RTC in 2 weeks

## 2017-12-23 NOTE — Progress Notes (Signed)
Physical Therapy Treatment Patient Details Name: Stacey Hanson MRN: [id number] DOB: 05-08-1945 Today's Date: 12/23/2017    History of Present Illness Pt s/p L TKR and hx of dm    PT Comments    Pt continues cooperative but ltd this pm by fatigue.  Pt performed therex program and ambulated ltd distance.   Follow Up Recommendations  SNF     Equipment Recommendations  None recommended by PT    Recommendations for Other Services       Precautions / Restrictions Precautions Precautions: Fall;Knee Restrictions Weight Bearing Restrictions: No Other Position/Activity Restrictions: WBAT    Mobility  Bed Mobility                  Transfers Overall transfer level: Needs assistance Equipment used: Rolling walker (2 wheeled) Transfers: Sit to/from Stand Sit to Stand: Min assist;Mod assist         General transfer comment: cues for LE management and use of UEs to self assist  Ambulation/Gait Ambulation/Gait assistance: Min assist;Mod assist Gait Distance (Feet): 5 Feet Assistive device: Rolling walker (2 wheeled) Gait Pattern/deviations: Step-to pattern;Decreased step length - right;Decreased step length - left;Shuffle;Trunk flexed Gait velocity: decr   General Gait Details: cues for sequence, posture, increased UE WB and position from [employer]             Wheelchair Mobility    Modified Rankin (Stroke Patients Only)       Balance Overall balance assessment: Needs assistance Sitting-balance support: No upper extremity supported;Feet supported Sitting balance-Leahy Scale: Good     Standing balance support: Bilateral upper extremity supported Standing balance-Leahy Scale: Poor                              Cognition Arousal/Alertness: Awake/alert Behavior During Therapy: WFL for tasks assessed/performed Overall Cognitive Status: Within Functional Limits for tasks assessed                                         Exercises Total Joint Exercises Ankle Circles/Pumps: AROM;Both;20 reps;Supine Quad Sets: AROM;Both;Supine;15 reps Heel Slides: AAROM;Left;15 reps;Supine Straight Leg Raises: AAROM;AROM;Left;Supine;20 reps    General Comments        Pertinent Vitals/Pain Pain Assessment: 0-10 Pain Score: 5  Pain Location: L knee Pain Descriptors / Indicators: Aching;Sore Pain Intervention(s): Limited activity within patient's tolerance;Monitored during session;Premedicated before session;Ice applied    Home Living                      Prior Function            PT Goals (current goals can now be found in the care plan section) Acute Rehab PT Goals Patient Stated Goal: Regain IND PT Goal Formulation: With patient Time For Goal Achievement: 12/29/17 Potential to Achieve Goals: Good Progress towards PT goals: Progressing toward goals    Frequency    7X/week      PT Plan Current plan remains appropriate    Co-evaluation              AM-PAC PT "6 Clicks" Daily Activity  Outcome Measure  Difficulty turning over in bed (including adjusting bedclothes, sheets and blankets)?: Unable Difficulty moving from lying on back to sitting on the side of the bed? : Unable Difficulty sitting down on and standing up from a  chair with arms (e.g., wheelchair, bedside commode, etc,.)?: Unable Help needed moving to and from a bed to chair (including a wheelchair)?: A Lot Help needed walking in hospital room?: A Lot Help needed climbing 3-5 steps with a railing? : A Lot 6 Click Score: 9    End of Session Equipment Utilized During Treatment: Gait belt Activity Tolerance: Patient limited by fatigue;Patient tolerated treatment well Patient left: in chair;with call bell/phone within reach;with chair alarm set Nurse Communication: Mobility status PT Visit Diagnosis: Difficulty in walking, not elsewhere classified (R26.2)     Time: 3474-2595 PT Time Calculation (min) (ACUTE ONLY): 24  min  Charges:  $Gait Training: 8-22 mins $Therapeutic Exercise: 8-22 mins                     [place] Pager [phone] Office [phone]    Elvy Mclarty 12/23/2017, 4:10 PM

## 2017-12-23 NOTE — Discharge Summary (Addendum)
Physician Discharge Summary  Patient ID: Stacey Hanson MRN: [id number] DOB/AGE: Feb 09, 1946 72 y.o.  Admit date: 12/21/2017 Discharge date:  12/24/2017  Procedures:  Procedure(s) (LRB): LEFT TOTAL KNEE ARTHROPLASTY (Left)  Attending Physician:  Dr. Paralee Cancel   Admission Diagnoses:   Left knee primary OA / pain  Discharge Diagnoses:  Principal Problem:   S/P left TKA Active Problems:   S/P total knee replacement  Past Medical History:  Diagnosis Date  . BMI 30.0-30.9,adult 2012 215 LBS   2004 198 LBS  . Diabetes mellitus   . 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 H. pylori infection 09/24/2010  . Hyperlipidemia   . Hypertension   . Hypothyroidism   . 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 Knee pain   . Obesity     HPI:    Stacey Hanson, 72 y.o. female, has a history of pain and functional disability in the left knee due to arthritis and has failed non-surgical conservative treatments for greater than 12 weeks to include NSAID's and/or analgesics, corticosteriod injections and activity modification.  Onset of symptoms was gradual, starting 2+ years ago with gradually worsening course since that time. The patient noted no past surgery on the left knee(s).  Patient currently rates pain in the left knee(s) at 9 out of 10 with activity. Patient has night pain, worsening of pain with activity and weight bearing, pain that interferes with activities of daily living, pain with passive range of motion, crepitus and joint swelling.  Patient has evidence of periarticular osteophytes and joint space narrowing by imaging studies.   There is no active infection.  Risks, benefits and expectations were discussed with the patient.  Risks including but not limited to the risk of anesthesia, blood clots, nerve damage, blood vessel damage, failure of the prosthesis, infection and up to and including death.  Patient understand the risks, benefits  and expectations and wishes to proceed with surgery.   PCP: Fayrene Helper, MD   Discharged Condition: good  Hospital Course:  Patient underwent the above stated procedure on 12/21/2017. Patient tolerated the procedure well and brought to the recovery room in good condition and subsequently to the floor.  POD #1 BP: 142/92 ; Pulse: 81 ; Temp: 97.9 F (36.6 C) ; Resp: 18 Patient reports pain as moderate.  No events throughout the night. Discussed that she doesn't really have anyone at home that can assist her and the need for SNF. Dorsiflexion/plantar flexion intact, incision: dressing C/D/I, no cellulitis present and compartment soft.   LABS  Basename    HGB     10.9  HCT     34.9   POD #2  BP: 125/61 ; Pulse: 98 ; Temp: 98.6 F (37 C) ; Resp: 20 Patient reports pain as mild to moderate.  Doing therapy this am at visit.  No events reported. Dorsiflexion/plantar flexion intact, incision: dressing C/D/I, no cellulitis present and compartment soft.   LABS  Basename    HGB     8.8  HCT     27.6   POD #2  BP: 147/74 ; Pulse: 99 ; Temp: 99 F (37.2 C) ; Resp: 18 Patient reports pain as mild, pain controlled. No events throughout the night.  Still feels that SNF would be her safest and most appropriate d/c plan.  Dorsiflexion/plantar flexion intact, incision: dressing C/D/I, no cellulitis present and compartment soft.   LABS  No new labs  Discharge Exam: General appearance: alert, cooperative and no distress Extremities: Homans sign is negative, no sign of DVT, no edema, redness or tenderness in the calves or thighs and no ulcers, gangrene or trophic changes  Disposition: Skilled nursing facility with follow up in 2 weeks    Contact information for follow-up providers    Paralee Cancel, MD. Schedule an appointment as soon as possible for a visit in 2 weeks.   Specialty:  Orthopedic Surgery Contact information: [address] [place]  [address] [phone]            Contact information for after-discharge care    Destination    [facility] .   Service:  Skilled Nursing Contact information: [address] [place] [phone]                  Discharge Instructions    Call MD / Call 911   Complete by:  As directed    If you experience chest pain or shortness of breath, CALL 911 and be transported to the hospital emergency room.  If you develope a fever above 101 F, pus (white drainage) or increased drainage or redness at the wound, or calf pain, call your surgeon's office.   Change dressing   Complete by:  As directed    Maintain surgical dressing until follow up in the clinic. If the edges start to pull up, may reinforce with tape. If the dressing is no longer working, may remove and cover with gauze and tape, but must keep the area dry and clean.  Call with any questions or concerns.   Constipation Prevention   Complete by:  As directed    Drink plenty of fluids.  Prune juice may be helpful.  You may use a stool softener, such as Colace (over the counter) 100 mg twice a day.  Use MiraLax (over the counter) for constipation as needed.   Diet - low sodium heart healthy   Complete by:  As directed    Discharge instructions   Complete by:  As directed    Maintain surgical dressing until follow up in the clinic. If the edges start to pull up, may reinforce with tape. If the dressing is no longer working, may remove and cover with gauze and tape, but must keep the area dry and clean.  Follow up in 2 weeks at [facility]. Call with any questions or concerns.   Increase activity slowly as tolerated   Complete by:  As directed    Weight bearing as tolerated with assist device (walker, cane, etc) as directed, use it as long as suggested by your surgeon or therapist, typically at least 4-6 weeks.   TED hose   Complete by:  As directed    Use  stockings (TED hose) for 2 weeks on both leg(s).  You may remove them at night for sleeping.      Allergies as of 12/23/2017      Reactions   Pravastatin Other (See Comments)   PT is currently taking but states she cramps   Simvastatin Other (See Comments)   Muscle cramps      Medication List    STOP taking these medications   ASPIRIN LOW DOSE 81 MG EC tablet Generic drug:  aspirin Replaced by:  aspirin 81 MG chewable tablet   UNABLE TO FIND     TAKE these medications   aspirin 81 MG chewable  tablet Chew 1 tablet (81 mg total) by mouth 2 (two) times daily. Take for 4 weeks, then resume regular dose. Replaces:  ASPIRIN LOW DOSE 81 MG EC tablet   B-D UF III MINI PEN NEEDLES 31G X 5 MM Misc Generic drug:  Insulin Pen Needle USE FOR TWICE DAILY INJECTIONS OF HUMALOG   benazepril 40 MG tablet Commonly known as:  LOTENSIN TAKE 1 TABLET(40 MG) BY MOUTH DAILY What changed:  See the new instructions.   CALCIUM 600/VITAMIN D3 PO Take 2 tablets by mouth daily.   docusate sodium 100 MG capsule Commonly known as:  COLACE Take 1 capsule (100 mg total) by mouth 2 (two) times daily.   ferrous sulfate 325 (65 FE) MG tablet Take 1 tablet (325 mg total) by mouth 3 (three) times daily with meals.   glucose blood test strip USE AS DIRECTED TWICE DAILY dx e11.9   HUMALOG MIX 75/25 KWIKPEN (75-25) 100 UNIT/ML Kwikpen Generic drug:  Insulin Lispro Prot & Lispro INJECT 40 UNITS INTO THE SKIN TWICE DAILY What changed:  See the new instructions.   hydrochlorothiazide 25 MG tablet Commonly known as:  HYDRODIURIL TAKE 1 TABLET(25 MG) BY MOUTH DAILY   HYDROcodone-acetaminophen 7.5-325 MG tablet Commonly known as:  NORCO Take 1-2 tablets by mouth every 4 (four) hours as needed for moderate pain.   levothyroxine 88 MCG tablet Commonly known as:  SYNTHROID, LEVOTHROID TAKE 1 TABLET BY MOUTH EVERY DAY IN THE MORNING What changed:    how much to take  how to take this  when to  take this  additional instructions   metFORMIN 1000 MG tablet Commonly known as:  GLUCOPHAGE TAKE 1 TABLET(1000 MG) BY MOUTH TWICE DAILY WITH A MEAL What changed:  See the new instructions.   methocarbamol 500 MG tablet Commonly known as:  ROBAXIN Take 1 tablet (500 mg total) by mouth every 6 (six) hours as needed for muscle spasms.   multivitamin capsule Take 1 capsule by mouth daily.   omeprazole 20 MG capsule Commonly known as:  PRILOSEC TAKE 1 CAPSULE BY MOUTH EVERY MORNING What changed:  when to take this   ONETOUCH DELICA LANCETS 72B Misc USE  TO CHECK GLUCOSE TWICE DAILY AS DIRECTED dx e11.9   polyethylene glycol packet Commonly known as:  MIRALAX / GLYCOLAX Take 17 g by mouth 2 (two) times daily.   pravastatin 40 MG tablet Commonly known as:  PRAVACHOL TAKE 1 TABLET(40 MG) BY MOUTH DAILY What changed:    how much to take  how to take this  when to take this  additional instructions            Discharge Care Instructions  (From admission, onward)         Start     Ordered   12/22/17 0000  Change dressing    Comments:  Maintain surgical dressing until follow up in the clinic. If the edges start to pull up, may reinforce with tape. If the dressing is no longer working, may remove and cover with gauze and tape, but must keep the area dry and clean.  Call with any questions or concerns.   12/22/17 0750           Signed: [place]. Shmuel Girgis   PA-C  12/23/2017, 11:18 AM

## 2017-12-23 NOTE — Progress Notes (Addendum)
Hypoglycemic Event  CBG: I was notified by the NT, Arion of a blood  62 at 1250  Treatment: Lunch was provided at 1250.  Symptoms: Pt was asymptomatic; pt declined and did not display any signs or symptoms.   Follow-up CBG: 68 at 1314; pt was given ice cream. Follow-up CBG: 88 at 1333   Comments/MD notified: Diabetic Coordinator was paged to evaluate the pt's insulin regimen. Danae Orleans, PA was notified as well.   The Diabetic Coordinator Jeannine returned my page and suggested changing the Novolog 70/30 40 units BID to Novolog 70/30 35 units BID. Danae Orleans was notified of the Diabetic Coordinator's suggestion of Novolog 70/30 35 units BID. Danae Orleans confirmed to modify the order as suggested.     Wynonia Musty

## 2017-12-24 DIAGNOSIS — D649 Anemia, unspecified: Secondary | ICD-10-CM | POA: Diagnosis not present

## 2017-12-24 DIAGNOSIS — M2550 Pain in unspecified joint: Secondary | ICD-10-CM | POA: Diagnosis not present

## 2017-12-24 DIAGNOSIS — Z7401 Bed confinement status: Secondary | ICD-10-CM | POA: Diagnosis not present

## 2017-12-24 DIAGNOSIS — M1990 Unspecified osteoarthritis, unspecified site: Secondary | ICD-10-CM | POA: Diagnosis not present

## 2017-12-24 DIAGNOSIS — E785 Hyperlipidemia, unspecified: Secondary | ICD-10-CM | POA: Diagnosis not present

## 2017-12-24 DIAGNOSIS — M25562 Pain in left knee: Secondary | ICD-10-CM | POA: Diagnosis not present

## 2017-12-24 DIAGNOSIS — I10 Essential (primary) hypertension: Secondary | ICD-10-CM | POA: Diagnosis not present

## 2017-12-24 DIAGNOSIS — R600 Localized edema: Secondary | ICD-10-CM | POA: Diagnosis not present

## 2017-12-24 DIAGNOSIS — E119 Type 2 diabetes mellitus without complications: Secondary | ICD-10-CM | POA: Diagnosis not present

## 2017-12-24 DIAGNOSIS — E039 Hypothyroidism, unspecified: Secondary | ICD-10-CM | POA: Diagnosis not present

## 2017-12-24 DIAGNOSIS — R2689 Other abnormalities of gait and mobility: Secondary | ICD-10-CM | POA: Diagnosis not present

## 2017-12-24 DIAGNOSIS — R278 Other lack of coordination: Secondary | ICD-10-CM | POA: Diagnosis not present

## 2017-12-24 DIAGNOSIS — M1991 Primary osteoarthritis, unspecified site: Secondary | ICD-10-CM | POA: Diagnosis not present

## 2017-12-24 DIAGNOSIS — Z96652 Presence of left artificial knee joint: Secondary | ICD-10-CM | POA: Diagnosis not present

## 2017-12-24 DIAGNOSIS — Z471 Aftercare following joint replacement surgery: Secondary | ICD-10-CM | POA: Diagnosis not present

## 2017-12-24 DIAGNOSIS — L03116 Cellulitis of left lower limb: Secondary | ICD-10-CM | POA: Diagnosis not present

## 2017-12-24 DIAGNOSIS — R52 Pain, unspecified: Secondary | ICD-10-CM | POA: Diagnosis not present

## 2017-12-24 DIAGNOSIS — M6281 Muscle weakness (generalized): Secondary | ICD-10-CM | POA: Diagnosis not present

## 2017-12-24 LAB — GLUCOSE, CAPILLARY: GLUCOSE-CAPILLARY: 78 mg/dL (ref 70–99)

## 2017-12-24 NOTE — Clinical Social Work Placement (Addendum)
Pt admitting to [facility]- report [phone]. Arranged [facility] transportation Facility advised [facility] Medicare authorized pt's admission today.   CLINICAL SOCIAL WORK PLACEMENT  NOTE  Date:  12/24/2017  Patient Details  Name: Stacey Hanson MRN: [id number] Date of Birth: 1945-07-01  Clinical Social Work is seeking post-discharge placement for this patient at the [place] level of care (*CSW will initial, date and re-position this form in  chart as items are completed):  Yes   Patient/family provided with [place] Work Department's list of facilities offering this level of care within the geographic area requested by the patient (or if unable, by the patient's family).  Yes   Patient/family informed of their freedom to choose among providers that offer the needed level of care, that participate in Medicare, Medicaid or managed care program needed by the patient, have an available bed and are willing to accept the patient.  Yes   Patient/family informed of [place]'s ownership interest in [facility] and [facility], as well as of the fact that they are under no obligation to receive care at these facilities.  PASRR submitted to EDS on       PASRR number received on 12/22/17     Existing PASRR number confirmed on       FL2 transmitted to all facilities in geographic area requested by pt/family on 12/22/17     FL2 transmitted to all facilities within larger geographic area on       Patient informed that his/her managed care company has contracts with or will negotiate with certain facilities, including the following:        Yes   Patient/family informed of bed offers received.  Patient chooses bed at [facility]     Physician recommends and patient chooses bed at [facility]    Patient to be transferred to [facility] on 12/24/17.  Patient to be transferred to facility by [facility]     Patient family notified on 12/24/17 of  transfer.  Name of family member notified:  pt notified son     PHYSICIAN       Additional Comment:    _______________________________________________ Nila Nephew, LCSW 12/24/2017, 10:34 AM [phone] coverage for [phone]

## 2017-12-24 NOTE — Progress Notes (Signed)
Physical Therapy Treatment Patient Details Name: Stacey Hanson MRN: [id number] DOB: 04/12/1945 Today's Date: 12/24/2017    History of Present Illness Pt s/p L TKR and hx of dm    PT Comments    Pt continues cooperative and progressing slowly with mobility and strengthening/ROM.  Pt to dc to SNF this date.   Follow Up Recommendations  SNF     Equipment Recommendations  None recommended by PT    Recommendations for Other Services       Precautions / Restrictions Precautions Precautions: Fall;Knee Restrictions Weight Bearing Restrictions: No Other Position/Activity Restrictions: WBAT    Mobility  Bed Mobility Overal bed mobility: Needs Assistance Bed Mobility: Supine to Sit     Supine to sit: Min assist     General bed mobility comments: Increased time with use of rail  Transfers Overall transfer level: Needs assistance Equipment used: Rolling walker (2 wheeled) Transfers: Sit to/from Stand Sit to Stand: Min assist;Mod assist         General transfer comment: cues for LE management and use of UEs to self assist  Ambulation/Gait Ambulation/Gait assistance: Min assist;Mod assist Gait Distance (Feet): 25 Feet Assistive device: Rolling walker (2 wheeled) Gait Pattern/deviations: Step-to pattern;Decreased step length - right;Decreased step length - left;Shuffle;Trunk flexed Gait velocity: decr   General Gait Details: cues for sequence, posture, increased UE WB and position from [employer]             Wheelchair Mobility    Modified Rankin (Stroke Patients Only)       Balance Overall balance assessment: Needs assistance Sitting-balance support: No upper extremity supported;Feet supported Sitting balance-Leahy Scale: Good     Standing balance support: Bilateral upper extremity supported Standing balance-Leahy Scale: Poor                              Cognition Arousal/Alertness: Awake/alert Behavior During Therapy: WFL for  tasks assessed/performed Overall Cognitive Status: Within Functional Limits for tasks assessed                                        Exercises Total Joint Exercises Ankle Circles/Pumps: AROM;Both;20 reps;Supine Quad Sets: AROM;Both;Supine;15 reps Heel Slides: AAROM;Left;15 reps;Supine Straight Leg Raises: AAROM;AROM;Left;Supine;20 reps Goniometric ROM: AAROM L knee -10 - 45 - pain limited with muscle guarding    General Comments        Pertinent Vitals/Pain Pain Assessment: 0-10 Pain Score: 5  Pain Location: L knee Pain Descriptors / Indicators: Aching;Sore Pain Intervention(s): Limited activity within patient's tolerance;Monitored during session;Premedicated before session;Ice applied    Home Living                      Prior Function            PT Goals (current goals can now be found in the care plan section) Acute Rehab PT Goals Patient Stated Goal: Regain IND PT Goal Formulation: With patient Time For Goal Achievement: 12/29/17 Potential to Achieve Goals: Good Progress towards PT goals: Progressing toward goals    Frequency    7X/week      PT Plan Current plan remains appropriate    Co-evaluation              AM-PAC PT "6 Clicks" Daily Activity  Outcome Measure  Difficulty turning over in bed (including  adjusting bedclothes, sheets and blankets)?: Unable Difficulty moving from lying on back to sitting on the side of the bed? : Unable Difficulty sitting down on and standing up from a chair with arms (e.g., wheelchair, bedside commode, etc,.)?: Unable Help needed moving to and from a bed to chair (including a wheelchair)?: A Lot Help needed walking in hospital room?: A Lot Help needed climbing 3-5 steps with a railing? : A Lot 6 Click Score: 9    End of Session Equipment Utilized During Treatment: Gait belt Activity Tolerance: Patient limited by fatigue;Patient tolerated treatment well Patient left: in chair;with call  bell/phone within reach;with chair alarm set Nurse Communication: Mobility status PT Visit Diagnosis: Difficulty in walking, not elsewhere classified (R26.2)     Time: 1610-9604 PT Time Calculation (min) (ACUTE ONLY): 29 min  Charges:  $Gait Training: 8-22 mins $Therapeutic Exercise: 8-22 mins                     [place] Pager [phone] Office [phone]    Siana Panameno 12/24/2017, 11:27 AM

## 2017-12-24 NOTE — Progress Notes (Signed)
     Subjective: 3 Days Post-Op Procedure(s) (LRB): LEFT TOTAL KNEE ARTHROPLASTY (Left)   Patient reports pain as mild, pain controlled. No events throughout the night.  Still feels that SNF would be her safest and most appropriate d/c plan.     Objective:   VITALS:   Vitals:   12/23/17 2139 12/24/17 0607  BP: (!) 143/68 (!) 147/74  Pulse: 99 99  Resp: 19 18  Temp: 98.3 F (36.8 C) 99 F (37.2 C)  SpO2: 100% 100%    Dorsiflexion/Plantar flexion intact Incision: dressing C/D/I No cellulitis present Compartment soft  LABS Recent Labs    12/22/17 0517 12/23/17 0502  HGB 10.9* 8.8*  HCT 34.9* 27.6*  WBC 14.3* 10.5  PLT 391 359    Recent Labs    12/22/17 0517 12/23/17 0502  NA 134* 138  K 5.0 4.4  BUN 28* 34*  CREATININE 1.06* 1.07*  GLUCOSE 196* 127*     Assessment/Plan: 3 Days Post-Op Procedure(s) (LRB): LEFT TOTAL KNEE ARTHROPLASTY (Left) Up with therapy Discharge to SNF  Follow up in 2 weeks at [facility] Follow up with OLIN,Mande Auvil D in 2 weeks.  Contact information:  [facility]) [address] [place]. Stacey Hanson   PAC  12/24/2017, 8:08 AM

## 2017-12-27 DIAGNOSIS — M25562 Pain in left knee: Secondary | ICD-10-CM | POA: Diagnosis not present

## 2017-12-27 DIAGNOSIS — R2689 Other abnormalities of gait and mobility: Secondary | ICD-10-CM | POA: Diagnosis not present

## 2017-12-28 DIAGNOSIS — E119 Type 2 diabetes mellitus without complications: Secondary | ICD-10-CM | POA: Diagnosis not present

## 2017-12-28 DIAGNOSIS — Z96652 Presence of left artificial knee joint: Secondary | ICD-10-CM | POA: Diagnosis not present

## 2017-12-28 DIAGNOSIS — R52 Pain, unspecified: Secondary | ICD-10-CM | POA: Diagnosis not present

## 2017-12-28 DIAGNOSIS — I10 Essential (primary) hypertension: Secondary | ICD-10-CM | POA: Diagnosis not present

## 2017-12-31 DIAGNOSIS — M25562 Pain in left knee: Secondary | ICD-10-CM | POA: Diagnosis not present

## 2017-12-31 DIAGNOSIS — R600 Localized edema: Secondary | ICD-10-CM | POA: Diagnosis not present

## 2017-12-31 DIAGNOSIS — R2689 Other abnormalities of gait and mobility: Secondary | ICD-10-CM | POA: Diagnosis not present

## 2018-01-03 DIAGNOSIS — D649 Anemia, unspecified: Secondary | ICD-10-CM | POA: Diagnosis not present

## 2018-01-03 DIAGNOSIS — Z96652 Presence of left artificial knee joint: Secondary | ICD-10-CM | POA: Diagnosis not present

## 2018-01-03 DIAGNOSIS — L03116 Cellulitis of left lower limb: Secondary | ICD-10-CM | POA: Diagnosis not present

## 2018-01-05 ENCOUNTER — Ambulatory Visit: Payer: Self-pay | Admitting: Orthopedic Surgery

## 2018-01-05 DIAGNOSIS — M25562 Pain in left knee: Secondary | ICD-10-CM | POA: Diagnosis not present

## 2018-01-05 DIAGNOSIS — R2689 Other abnormalities of gait and mobility: Secondary | ICD-10-CM | POA: Diagnosis not present

## 2018-01-06 DIAGNOSIS — R52 Pain, unspecified: Secondary | ICD-10-CM | POA: Diagnosis not present

## 2018-01-06 DIAGNOSIS — M1990 Unspecified osteoarthritis, unspecified site: Secondary | ICD-10-CM | POA: Diagnosis not present

## 2018-01-06 DIAGNOSIS — L03116 Cellulitis of left lower limb: Secondary | ICD-10-CM | POA: Diagnosis not present

## 2018-01-06 DIAGNOSIS — Z96652 Presence of left artificial knee joint: Secondary | ICD-10-CM | POA: Diagnosis not present

## 2018-01-07 DIAGNOSIS — M25562 Pain in left knee: Secondary | ICD-10-CM | POA: Diagnosis not present

## 2018-01-07 DIAGNOSIS — R2689 Other abnormalities of gait and mobility: Secondary | ICD-10-CM | POA: Diagnosis not present

## 2018-01-11 DIAGNOSIS — R2689 Other abnormalities of gait and mobility: Secondary | ICD-10-CM | POA: Diagnosis not present

## 2018-01-11 DIAGNOSIS — M25562 Pain in left knee: Secondary | ICD-10-CM | POA: Diagnosis not present

## 2018-01-12 DIAGNOSIS — Z96652 Presence of left artificial knee joint: Secondary | ICD-10-CM | POA: Diagnosis not present

## 2018-01-12 DIAGNOSIS — M1990 Unspecified osteoarthritis, unspecified site: Secondary | ICD-10-CM | POA: Diagnosis not present

## 2018-01-12 DIAGNOSIS — I10 Essential (primary) hypertension: Secondary | ICD-10-CM | POA: Diagnosis not present

## 2018-01-12 DIAGNOSIS — L03116 Cellulitis of left lower limb: Secondary | ICD-10-CM | POA: Diagnosis not present

## 2018-01-20 ENCOUNTER — Encounter (HOSPITAL_COMMUNITY): Payer: Self-pay

## 2018-01-20 ENCOUNTER — Other Ambulatory Visit: Payer: Self-pay

## 2018-01-20 ENCOUNTER — Ambulatory Visit (HOSPITAL_COMMUNITY): Payer: Medicare Other | Attending: Orthopedic Surgery

## 2018-01-20 DIAGNOSIS — M25562 Pain in left knee: Secondary | ICD-10-CM | POA: Insufficient documentation

## 2018-01-20 DIAGNOSIS — R2689 Other abnormalities of gait and mobility: Secondary | ICD-10-CM | POA: Insufficient documentation

## 2018-01-20 DIAGNOSIS — M6281 Muscle weakness (generalized): Secondary | ICD-10-CM | POA: Diagnosis not present

## 2018-01-20 DIAGNOSIS — M25662 Stiffness of left knee, not elsewhere classified: Secondary | ICD-10-CM | POA: Diagnosis not present

## 2018-01-20 DIAGNOSIS — R29898 Other symptoms and signs involving the musculoskeletal system: Secondary | ICD-10-CM | POA: Diagnosis not present

## 2018-01-20 NOTE — Therapy (Signed)
[place], [place], [address] Phone: [phone]   Fax:  [phone]  Physical Therapy Evaluation  Patient Details  Name: Stacey Hanson MRN: [id number] Date of Birth: 05-31-1945 Referring Provider (PT):  Paralee Cancel, MD   Encounter Date: 01/20/2018  PT End of Session - 01/20/18 1210    Visit Number  1    Number of Visits  8    Date for PT Re-Evaluation  02/17/18    Authorization Type  [employer] $40 copay and applies to OOP max, no dedcutible, based on medical necessity, no auth required    Authorization Time Period  cert 09/47/0962 - 83/66/2947    Authorization - Visit Number  1    Authorization - Number of Visits  8    PT Start Time  0949    PT Stop Time  1030    PT Time Calculation (min)  41 min    Activity Tolerance  Patient tolerated treatment well    Behavior During Therapy  [facility] for tasks assessed/performed       Past Medical History:  Diagnosis Date  . BMI 30.0-30.9,adult 2012 215 LBS   2004 198 LBS  . Diabetes mellitus   . 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 H. pylori infection 09/24/2010  . Hyperlipidemia   . Hypertension   . Hypothyroidism   . IDDM (insulin dependent diabetes mellitus) ([place]) 1987   type 2   . Knee pain   . Obesity     Past Surgical History:  Procedure Laterality Date  . ABDOMINAL HYSTERECTOMY  1970  . CHOLECYSTECTOMY  OCT 2010 BZ BILIARY DYSKINESIA   CHRONIC CHOLECYSTITIS  . COLONOSCOPY  MAY 2009 SCREENING   pTC TICS, SML IH  . COLONOSCOPY N/A 08/11/2017   Procedure: COLONOSCOPY;  Surgeon: Danie Binder, MD;  [facility];  Service: Endoscopy;  Laterality: N/A;  9:30am  . ESOPHAGOGASTRODUODENOSCOPY  10/03/2010   Procedure: ESOPHAGOGASTRODUODENOSCOPY (EGD);  Surgeon: Dorothyann Peng, MD;  [facility];  Service: Endoscopy;  Laterality: N/A;  . ESOPHAGOGASTRODUODENOSCOPY  12/19/2002   RMR: Normal esophagus/Normal stomach/ Duodenum  large bulbar diverticulum, 1 cm bulbar ulcer with surrounding/ inflammation as described above. Normal D2  . POLYPECTOMY  08/11/2017   Procedure: POLYPECTOMY;  Surgeon: Danie Binder, MD;  [facility];  Service: Endoscopy;;  cecal   . SAVORY DILATION  10/03/2010   Procedure: SAVORY DILATION;  Surgeon: Dorothyann Peng, MD;  [facility];  Service: Endoscopy;;  . TOTAL ABDOMINAL HYSTERECTOMY  1970  . TOTAL KNEE ARTHROPLASTY Left 12/21/2017   Procedure: LEFT TOTAL KNEE ARTHROPLASTY;  Surgeon: Paralee Cancel, MD;  [facility];  Service: Orthopedics;  Laterality: Left;  70 mins block  . UPPER GASTROINTESTINAL ENDOSCOPY  OCT 2004 RMR   DUO TIC, & ULCER    There were no vitals filed for this visit.   Subjective Assessment - 01/20/18 1002    Subjective  Progressive left knee pain. Lt TKA 12/21/2017. Went to inpatient rehab until 01/12/2018 and was there 21 days. Has been working on moving and strengthening knee since she has gotten home. Uses RW. Used to go to [facility] 3-4x/wk prior to surgery and would like to continue when gets better. Has been doing her usual housework with the RW.  Feels like swelling has a lot to do with her recovery.    How long can you sit comfortably?  1 hour before knee gets stiff and needs to get up and walk.     How long can you stand comfortably?  with RW 15-20 minutes    How long can you walk comfortably?  with RW can walk around [employer] to walk around but not without it.    Patient Stated Goals  try to get myself to together to get better with knee and return to prior activiites like going to the [facility] 3-4x/wk.     Currently in Pain?  Yes    Pain Score  5     Pain Location  Knee    Pain Orientation  Left    Pain Descriptors / Indicators  --   all around knee joint itself   Pain Type  Surgical pain    Aggravating Factors   walking, standing    Pain Relieving Factors  pain medicine, getting up and walking for stiffness, ice pack    Effect of  Pain on Daily Activities  limits         [facility] PT Assessment - 01/20/18 0001      Assessment   Medical Diagnosis  Lt TKA    Referring Provider (PT)   Paralee Cancel, MD    Onset Date/Surgical Date  12/21/17    Hand Dominance  Right    Next MD Visit  02/04/2018    Prior Therapy  Yes inpatient rehab      Precautions   Precautions  None      Restrictions   Weight Bearing Restrictions  No      Balance Screen   Has the patient fallen in the past 6 months  No    Has the patient had a decrease in activity level because of a fear of falling?   Yes    Is the patient reluctant to leave their home because of a fear of falling?   Yes      Prior Function   Level of Independence  Independent      Cognition   Overall Cognitive Status  Within Functional Limits for tasks assessed      Observation/Other Assessments   Focus on Therapeutic Outcomes (FOTO)   41% limited      Observation/Other Assessments-Edema    Edema  Circumferential      Circumferential Edema   Circumferential - Right  38.7    Circumferential - Left   42.6      ROM / Strength   AROM / PROM / Strength  AROM;Strength      AROM   AROM Assessment Site  Knee    Right/Left Knee  Right;Left    Right Knee Extension  23    Right Knee Flexion  104    Left Knee Extension  11    Left Knee Flexion  98      Strength   Strength Assessment Site  Hip;Knee;Ankle    Right/Left Hip  Right;Left    Right Hip Flexion  4-/5    Right Hip Extension  3+/5    Right Hip ABduction  4-/5    Left Hip Flexion  4/5    Left Hip Extension  4-/5    Left Hip ABduction  4-/5    Right/Left Knee  Right;Left    Right Knee Flexion  3+/5    Right Knee Extension  4/5    Left Knee Flexion  4-/5    Left Knee Extension  4-/5    Right/Left Ankle  Right;Left  Transfers   Five time sit to stand comments   24.68 sec w/ B UE support      Ambulation/Gait   Ambulation Distance (Feet)  306 Feet   2MWT   Assistive device  Rolling walker    Gait  Pattern  Step-through pattern;Decreased step length - right;Decreased stance time - left;Right flexed knee in stance;Left flexed knee in stance;Abducted - left;Abducted- right    Ambulation Surface  Level;Indoor    Gait velocity  0.85 m/s      Balance   Balance Assessed  Yes      Static Standing Balance   Static Standing - Balance Support  No upper extremity supported    Static Standing - Level of Assistance  5: Stand by assistance    Static Standing Balance -  Activities   Single Leg Stance - Right Leg;Single Leg Stance - Left Leg    Static Standing - Comment/# of Minutes  Rt 4 sec; Lt 0 sec                Objective measurements completed on examination: See above findings.              PT Education - 01/20/18 1209    Education Details  Examination findings and plan of care. Initial HEP.    Person(s) Educated  Patient    Methods  Explanation;Demonstration;Handout    Comprehension  Verbalized understanding;Need further instruction       PT Short Term Goals - 01/20/18 1230      PT SHORT TERM GOAL #1   Title  Patient will demonstrate understanding and report regular compliance with HEP to improve knee AROM, lower extremity strength, and overall functional mobility.     Time  2    Period  Weeks    Status  New    Target Date  02/03/18      PT SHORT TERM GOAL #2   Title  Patient will demonstrate left/right knee extension/flexion active range of motion of at least 7-110 degrees to assist with more normalized gait pattern and stair ambulation.    Baseline  initial - 11-98    Time  2    Period  Weeks    Status  New        PT Long Term Goals - 01/20/18 1231      PT LONG TERM GOAL #1   Title  Patient will demonstrate improvement of  MMT grade in all musculature tested as deficient at evaluation to assist with improvement in gait mechanics and stair ambulation.     Baseline  see MMT    Time  4    Period  Weeks    Status  New    Target Date  02/17/18       PT LONG TERM GOAL #2   Title  Patient will perform single limb stance on left/right lower extremity for 10 seconds or greater in order to assist with stair ambulation and decrease risk for falls.    Baseline  initial - Rt 4 sec; Lt 0 sec    Time  4    Period  Weeks    Status  New      PT LONG TERM GOAL #3   Title  Patient will improve ROM for left knee extension/flexion to 3-120 degrees to improve squatting, and other functional mobility.    Baseline  initial - 11-98    Time  4    Period  Weeks    Status  New      PT LONG TERM GOAL #4   Title  Patient will perform 5 times sit to stand in 15 seconds or less than as evidence of improved balance and decreased risk of falls.     Baseline  initial - 24.68 sec    Time  4    Period  Weeks    Status  New             Plan - 01/20/18 1220    Clinical Impression Statement  Patient is a 72 year old female presenting to outpatient physical therapy s/p left TKA 12/21/2017. Patient received inpatient rehab for 21 days ending 01/12/2018. Patient exhibits post surgical impairments including increased pain, decreased strength, decreased AROM, decreased balance, impaired gait and functional abilities and edema in the left LE. Patient also presents with deficits in right LE which may complicate Lt LE recovery progression. Patient would benefit from physical therapy to address the aforementioned deficits.     History and Personal Factors relevant to plan of care:  PMH: cataracts, HTN, DMT2, HLD, obesity, DJD Rt knee, osteopenia, back apin, GERD, hypothyroidism    Clinical Presentation  Stable    Clinical Presentation due to:  FOTO, edema, pain, AROM, MMT, SLS, 5xSTS, 2MWT, clinical judgement    Clinical Decision Making  Low    Rehab Potential  Fair    Clinical Impairments Affecting Rehab Potential  positive - recovery this far and activity level recently; negative, Rt knee DJD, Lt knee AROM this date 11-98    PT Frequency  2x / week   due to high  co-pay   PT Duration  4 weeks    PT Treatment/Interventions  ADLs/Self Care Home Management;Aquatic Therapy;Therapeutic activities;Therapeutic exercise;DME Instruction;Gait training;Stair training;Functional mobility training;Patient/family education;Neuromuscular re-education;Balance training;Manual techniques;Scar mobilization;Manual lymph drainage;Passive range of motion;Dry needling;Energy conservation;Joint Manipulations    PT Next Visit Plan  Review goals, eval and HEP. QS, SLR, gastro/ham stretches, Focus on improving AROM and quad/VMO strength of left (and right) knee and progressing HEP. Progress to more weight bearing strengthening exercises and balance training as able    PT Home Exercise Plan  initial - supine and seat heel slides    Consulted and Agree with Plan of Care  Patient       Patient will benefit from skilled therapeutic intervention in order to improve the following deficits and impairments:  Abnormal gait, Increased edema, Decreased activity tolerance, Decreased strength, Pain, Decreased knowledge of use of DME, Decreased balance, Decreased mobility, Difficulty walking, Decreased range of motion, Postural dysfunction  Visit Diagnosis: Acute pain of left knee  Stiffness of left knee, not elsewhere classified  Muscle weakness (generalized)  Other symptoms and signs involving the musculoskeletal system  Other abnormalities of gait and mobility     Problem List Patient Active Problem List   Diagnosis Date Noted  . S/P left TKA 12/21/2017  . S/P total knee replacement 12/21/2017  . Need for immunization against influenza 11/14/2017  . Pre-operative clearance 11/14/2017  . Encounter for screening colonoscopy 07/07/2017  . Back pain 04/30/2015  . Seasonal allergies 02/21/2015  . Annual physical exam 09/27/2014  . Osteopenia 06/20/2013  . Hypothyroidism, postradioiodine therapy 12/28/2010  . GERD (gastroesophageal reflux disease) 09/22/2010  . DEGENERATIVE JOINT  DISEASE, KNEES, BILATERAL 04/14/2007  . Diabetes mellitus, insulin dependent (IDDM), controlled ([place]) 03/31/2007  . Hyperlipidemia 03/31/2007  . Obesity 03/31/2007  . ESSENTIAL HYPERTENSION, BENIGN 03/31/2007    Floria Raveling. Hartnett-Rands, MS, PT [facility]  [place] [phone] 01/20/2018, 1:39 PM  [place] [address] [place], [place], [address] Phone: [phone]   Fax:  [phone]  Name: Wallace Cogliano MRN: [id number] Date of Birth: 31-Aug-1945

## 2018-01-20 NOTE — Patient Instructions (Signed)
Heel Slides    Squeeze pelvic floor and hold. Slide left heel along bed towards bottom. Use right leg for assistance. Hold for _10__ seconds. Slide back to flat knee position. Repeat _10__ times. Do _3__ times a day. Repeat with other leg.    Copyright  VHI. All rights reserved.   Self-Mobilization: Knee Flexion / Extension (Sitting)    Gently push left leg back with other leg until a stretch is felt. Hold _10___ secos. ndRelax. Recross bent legs at ankles. Slowly straighten legs, pushing with lower leg. Hold ____ seconds. Repeat 10____ times per set. Do _1___ sets per session. Do 3____ sessions per day.  http://orth.exer.us/593   Copyright  VHI. All rights reserved.

## 2018-01-24 ENCOUNTER — Ambulatory Visit (HOSPITAL_COMMUNITY): Payer: Medicare Other | Admitting: Physical Therapy

## 2018-01-24 ENCOUNTER — Ambulatory Visit: Payer: Medicare Other | Admitting: Family Medicine

## 2018-01-24 ENCOUNTER — Encounter (HOSPITAL_COMMUNITY): Payer: Self-pay | Admitting: Physical Therapy

## 2018-01-24 DIAGNOSIS — M25662 Stiffness of left knee, not elsewhere classified: Secondary | ICD-10-CM

## 2018-01-24 DIAGNOSIS — M25562 Pain in left knee: Secondary | ICD-10-CM | POA: Diagnosis not present

## 2018-01-24 DIAGNOSIS — R2689 Other abnormalities of gait and mobility: Secondary | ICD-10-CM | POA: Diagnosis not present

## 2018-01-24 DIAGNOSIS — M6281 Muscle weakness (generalized): Secondary | ICD-10-CM | POA: Diagnosis not present

## 2018-01-24 DIAGNOSIS — R29898 Other symptoms and signs involving the musculoskeletal system: Secondary | ICD-10-CM | POA: Diagnosis not present

## 2018-01-24 NOTE — Therapy (Signed)
[place], [place], [address] Phone: [phone]   Fax:  [phone]  Physical Therapy Treatment  Patient Details  Name: Stacey Hanson MRN: [id number] Date of Birth: June 11, 1945 Referring Provider (PT):  Paralee Cancel, MD   Encounter Date: 01/24/2018  PT End of Session - 01/24/18 1131    Visit Number  2    Number of Visits  8    Date for PT Re-Evaluation  02/17/18    Authorization Type  [employer] $40 copay and applies to OOP max, no dedcutible, based on medical necessity, no auth required    Authorization Time Period  cert 33/54/5625 - 63/89/3734    Authorization - Visit Number  2    Authorization - Number of Visits  8    PT Start Time  1115    PT Stop Time  1154    PT Time Calculation (min)  39 min    Activity Tolerance  Patient tolerated treatment well    Behavior During Therapy  [facility] for tasks assessed/performed       Past Medical History:  Diagnosis Date  . BMI 30.0-30.9,adult 2012 215 LBS   2004 198 LBS  . Diabetes mellitus   . 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 H. pylori infection 09/24/2010  . Hyperlipidemia   . Hypertension   . Hypothyroidism   . IDDM (insulin dependent diabetes mellitus) ([place]) 1987   type 2   . Knee pain   . Obesity     Past Surgical History:  Procedure Laterality Date  . ABDOMINAL HYSTERECTOMY  1970  . CHOLECYSTECTOMY  OCT 2010 BZ BILIARY DYSKINESIA   CHRONIC CHOLECYSTITIS  . COLONOSCOPY  MAY 2009 SCREENING   pTC TICS, SML IH  . COLONOSCOPY N/A 08/11/2017   Procedure: COLONOSCOPY;  Surgeon: Danie Binder, MD;  [facility];  Service: Endoscopy;  Laterality: N/A;  9:30am  . ESOPHAGOGASTRODUODENOSCOPY  10/03/2010   Procedure: ESOPHAGOGASTRODUODENOSCOPY (EGD);  Surgeon: Dorothyann Peng, MD;  [facility];  Service: Endoscopy;  Laterality: N/A;  . ESOPHAGOGASTRODUODENOSCOPY  12/19/2002   RMR: Normal esophagus/Normal stomach/ Duodenum  large bulbar diverticulum, 1 cm bulbar ulcer with surrounding/ inflammation as described above. Normal D2  . POLYPECTOMY  08/11/2017   Procedure: POLYPECTOMY;  Surgeon: Danie Binder, MD;  [facility];  Service: Endoscopy;;  cecal   . SAVORY DILATION  10/03/2010   Procedure: SAVORY DILATION;  Surgeon: Dorothyann Peng, MD;  [facility];  Service: Endoscopy;;  . TOTAL ABDOMINAL HYSTERECTOMY  1970  . TOTAL KNEE ARTHROPLASTY Left 12/21/2017   Procedure: LEFT TOTAL KNEE ARTHROPLASTY;  Surgeon: Paralee Cancel, MD;  [facility];  Service: Orthopedics;  Laterality: Left;  70 mins block  . UPPER GASTROINTESTINAL ENDOSCOPY  OCT 2004 RMR   DUO TIC, & ULCER    There were no vitals filed for this visit.  Subjective Assessment - 01/24/18 1119    Subjective  Patient reported that her left knee pain is a 6/10 today which she described as an ache.     How long can you sit comfortably?  1 hour before knee gets stiff and needs to get up and walk.     How long can you stand comfortably?  with RW 15-20 minutes    How long can you walk comfortably?  with RW can walk around [employer] to walk around but not without it.  Patient Stated Goals  try to get myself to together to get better with knee and return to prior activiites like going to the [facility] 3-4x/wk.     Currently in Pain?  Yes    Pain Score  6     Pain Location  Knee    Pain Orientation  Left    Pain Descriptors / Indicators  Aching    Pain Type  Surgical pain                       [facility] Adult PT Treatment/Exercise - 01/24/18 0001      Exercises   Exercises  Knee/Hip      Knee/Hip Exercises: Stretches   Passive Hamstring Stretch  Left;3 reps;30 seconds    Passive Hamstring Stretch Limitations  Seated with foot on 8'' step    Knee: Self-Stretch to increase Flexion  Left;10 seconds;Other (comment)   10 repetitions   Gastroc Stretch  Both;3 reps;30 seconds      Knee/Hip Exercises: Seated   Heel Slides   AROM;Strengthening;Left;1 set;10 reps    Heel Slides Limitations  10 second holds      Knee/Hip Exercises: Supine   Quad Sets  Strengthening;Left;1 set;10 reps    Quad Sets Limitations  5'' holds    Short Arc [employer]  Strengthening;AROM;Left;1 set;10 reps    Short Arc Quad Sets Limitations  5'' holds, with knee over bolster    Heel Slides  Left;AROM;1 set;10 reps    Knee Extension  AROM    Knee Extension Limitations  10    Knee Flexion  AROM    Knee Flexion Limitations  94             PT Education - 01/24/18 1143    Education Details  Reviewed evaluation and goals, updated HEP.     Person(s) Educated  Patient    Methods  Explanation;Handout    Comprehension  Verbalized understanding       PT Short Term Goals - 01/24/18 1121      PT SHORT TERM GOAL #1   Title  Patient will demonstrate understanding and report regular compliance with HEP to improve knee AROM, lower extremity strength, and overall functional mobility.     Time  2    Period  Weeks    Status  On-going      PT SHORT TERM GOAL #2   Title  Patient will demonstrate left/right knee extension/flexion active range of motion of at least 7-110 degrees to assist with more normalized gait pattern and stair ambulation.    Baseline  initial - 11-98    Time  2    Period  Weeks    Status  On-going        PT Long Term Goals - 01/24/18 1122      PT LONG TERM GOAL #1   Title  Patient will demonstrate improvement of  MMT grade in all musculature tested as deficient at evaluation to assist with improvement in gait mechanics and stair ambulation.     Baseline  see MMT    Time  4    Period  Weeks    Status  On-going      PT LONG TERM GOAL #2   Title  Patient will perform single limb stance on left/right lower extremity for 10 seconds or greater in order to assist with stair ambulation and decrease risk for falls.    Baseline  initial - Rt 4 sec; Lt  0 sec    Time  4    Period  Weeks    Status  On-going      PT  LONG TERM GOAL #3   Title  Patient will improve ROM for left knee extension/flexion to 3-120 degrees to improve squatting, and other functional mobility.    Baseline  initial - 11-98    Time  4    Period  Weeks    Status  On-going      PT LONG TERM GOAL #4   Title  Patient will perform 5 times sit to stand in 15 seconds or less than as evidence of improved balance and decreased risk of falls.     Baseline  initial - 24.68 sec    Time  4    Period  Weeks    Status  On-going            Plan - 01/24/18 1216    Clinical Impression Statement  This session began by reviewing patient's evaluation and goals. Then reviewed patient's HEP and had patient demonstrate exercises. The remainder of the session focused on exercises to improve patient's left knee AROM. This session patient's left knee AROM ranged from 10 degrees of extension to 94 degrees of flexion. Patient would benefit from continued skilled physical therapy in order to continue progressing towards functional goals.     Rehab Potential  Fair    Clinical Impairments Affecting Rehab Potential  positive - recovery this far and activity level recently; negative, Rt knee DJD, Lt knee AROM this date 11-98    PT Frequency  2x / week   due to high co-pay   PT Duration  4 weeks    PT Treatment/Interventions  ADLs/Self Care Home Management;Aquatic Therapy;Therapeutic activities;Therapeutic exercise;DME Instruction;Gait training;Stair training;Functional mobility training;Patient/family education;Neuromuscular re-education;Balance training;Manual techniques;Scar mobilization;Manual lymph drainage;Passive range of motion;Dry needling;Energy conservation;Joint Manipulations    PT Next Visit Plan  QS, SLR, gastro/ham stretches, Focus on improving AROM and quad/VMO strength of left (and right) knee and progressing HEP. Consider manual therapy to decrease edema.  Progress to more weight bearing strengthening exercises and balance training as able     PT Home Exercise Plan  initial - supine and seat heel slides; 01/24/18: Quad set 1 x 10 1x/day    Consulted and Agree with Plan of Care  Patient       Patient will benefit from skilled therapeutic intervention in order to improve the following deficits and impairments:  Abnormal gait, Increased edema, Decreased activity tolerance, Decreased strength, Pain, Decreased knowledge of use of DME, Decreased balance, Decreased mobility, Difficulty walking, Decreased range of motion, Postural dysfunction  Visit Diagnosis: Acute pain of left knee  Stiffness of left knee, not elsewhere classified  Muscle weakness (generalized)  Other symptoms and signs involving the musculoskeletal system  Other abnormalities of gait and mobility     Problem List Patient Active Problem List   Diagnosis Date Noted  . S/P left TKA 12/21/2017  . S/P total knee replacement 12/21/2017  . Need for immunization against influenza 11/14/2017  . Pre-operative clearance 11/14/2017  . Encounter for screening colonoscopy 07/07/2017  . Back pain 04/30/2015  . Seasonal allergies 02/21/2015  . Annual physical exam 09/27/2014  . Osteopenia 06/20/2013  . Hypothyroidism, postradioiodine therapy 12/28/2010  . GERD (gastroesophageal reflux disease) 09/22/2010  . DEGENERATIVE JOINT DISEASE, KNEES, BILATERAL 04/14/2007  . Diabetes mellitus, insulin dependent (IDDM), controlled ([place]) 03/31/2007  . Hyperlipidemia 03/31/2007  . Obesity 03/31/2007  . ESSENTIAL HYPERTENSION,  BENIGN 03/31/2007   Clarene Critchley PT, DPT 12:18 PM, 01/24/18 [place], [place], [address] Phone: [phone]   Fax:  [phone]  Name: Stacey Hanson MRN: [id number] Date of Birth: 01-08-1946

## 2018-01-24 NOTE — Patient Instructions (Signed)
Quad Set    With other leg bent, foot flat, slowly tighten muscles on thigh of straight leg while counting out loud to __5__. Repeat with other leg. Repeat __10__ times. Do __1__ sessions per day.  http://[email or web address].exer.us/276   Copyright  VHI. All rights reserved.

## 2018-01-25 ENCOUNTER — Ambulatory Visit (HOSPITAL_COMMUNITY): Payer: Medicare Other | Admitting: Physical Therapy

## 2018-01-25 DIAGNOSIS — M6281 Muscle weakness (generalized): Secondary | ICD-10-CM

## 2018-01-25 DIAGNOSIS — R2689 Other abnormalities of gait and mobility: Secondary | ICD-10-CM | POA: Diagnosis not present

## 2018-01-25 DIAGNOSIS — M25562 Pain in left knee: Secondary | ICD-10-CM

## 2018-01-25 DIAGNOSIS — M25662 Stiffness of left knee, not elsewhere classified: Secondary | ICD-10-CM | POA: Diagnosis not present

## 2018-01-25 DIAGNOSIS — R29898 Other symptoms and signs involving the musculoskeletal system: Secondary | ICD-10-CM | POA: Diagnosis not present

## 2018-01-25 NOTE — Therapy (Signed)
[place], [place], [address] Phone: [phone]   Fax:  [phone]  Physical Therapy Treatment  Patient Details  Name: Stacey Hanson MRN: [id number] Date of Birth: Jul 05, 1945 Referring Provider (PT):  Paralee Cancel, MD   Encounter Date: 01/25/2018  PT End of Session - 01/25/18 1320    Visit Number  3    Number of Visits  8    Date for PT Re-Evaluation  02/17/18    Authorization Type  [employer] $40 copay and applies to OOP max, no dedcutible, based on medical necessity, no auth required    Authorization Time Period  cert 17/79/3903 - 00/92/3300    Authorization - Visit Number  3    Authorization - Number of Visits  8    Activity Tolerance  Patient tolerated treatment well    Behavior During Therapy  [facility] for tasks assessed/performed       Past Medical History:  Diagnosis Date  . BMI 30.0-30.9,adult 2012 215 LBS   2004 198 LBS  . Diabetes mellitus   . 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 H. pylori infection 09/24/2010  . Hyperlipidemia   . Hypertension   . Hypothyroidism   . 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 Knee pain   . Obesity     Past Surgical History:  Procedure Laterality Date  . ABDOMINAL HYSTERECTOMY  1970  . CHOLECYSTECTOMY  OCT 2010 BZ BILIARY DYSKINESIA   CHRONIC CHOLECYSTITIS  . COLONOSCOPY  MAY 2009 SCREENING   pTC TICS, SML IH  . COLONOSCOPY N/A 08/11/2017   Procedure: COLONOSCOPY;  Surgeon: Danie Binder, MD;  [facility];  Service: Endoscopy;  Laterality: N/A;  9:30am  . ESOPHAGOGASTRODUODENOSCOPY  10/03/2010   Procedure: ESOPHAGOGASTRODUODENOSCOPY (EGD);  Surgeon: Dorothyann Peng, MD;  [facility];  Service: Endoscopy;  Laterality: N/A;  . ESOPHAGOGASTRODUODENOSCOPY  12/19/2002   RMR: Normal esophagus/Normal stomach/ Duodenum large bulbar diverticulum, 1 cm bulbar ulcer with surrounding/ inflammation as  described above. Normal D2  . POLYPECTOMY  08/11/2017   Procedure: POLYPECTOMY;  Surgeon: Danie Binder, MD;  [facility];  Service: Endoscopy;;  cecal   . SAVORY DILATION  10/03/2010   Procedure: SAVORY DILATION;  Surgeon: Dorothyann Peng, MD;  [facility];  Service: Endoscopy;;  . TOTAL ABDOMINAL HYSTERECTOMY  1970  . TOTAL KNEE ARTHROPLASTY Left 12/21/2017   Procedure: LEFT TOTAL KNEE ARTHROPLASTY;  Surgeon: Paralee Cancel, MD;  [facility];  Service: Orthopedics;  Laterality: Left;  70 mins block  . UPPER GASTROINTESTINAL ENDOSCOPY  OCT 2004 RMR   DUO TIC, & ULCER    There were no vitals filed for this visit.  Subjective Assessment - 01/25/18 1229    Subjective  PT states her knee is sore and stiff today.      Currently in Pain?  Yes    Pain Score  5     Pain Location  Knee    Pain Orientation  Left    Pain Descriptors / Indicators  Aching;Sore;Tightness                       [facility] Adult PT Treatment/Exercise - 01/25/18 0001      Exercises   Exercises  Knee/Hip      Knee/Hip Exercises: Stretches   Passive Hamstring Stretch  Left;3 reps;30 seconds  Passive Hamstring Stretch Limitations  standing 12"    Knee: Self-Stretch to increase Flexion  Left;10 seconds;Other (comment)    Knee: Self-Stretch Limitations  10 reps    Gastroc Stretch  Both;3 reps;30 seconds    Gastroc Stretch Limitations  slant board      Knee/Hip Exercises: Standing   Heel Raises  Both;10 reps    Heel Raises Limitations  toeraises 10 reps    Knee Flexion  10 reps;Left      Knee/Hip Exercises: Seated   Long Arc Quad  Left;10 reps      Knee/Hip Exercises: Supine   Quad Sets  Strengthening;Left;1 set;10 reps    [employer] Limitations  5" holds    Short Arc [employer]  Strengthening;AROM;Left;1 set;10 reps    Short Arc Quad Sets Limitations  5" holds    Knee Extension  AROM    Knee Extension Limitations  10    Knee Flexion  AROM    Knee Flexion Limitations   92 before, 102 following manual       Manual Therapy   Manual Therapy  Myofascial release;Edema management    Manual therapy comments  completed seperate from all other skilled interventions    Edema Management  retro massage to reduce edema    Myofascial Release  to scar and perimeter to reduce adhesions               PT Short Term Goals - 01/24/18 1121      PT SHORT TERM GOAL #1   Title  Patient will demonstrate understanding and report regular compliance with HEP to improve knee AROM, lower extremity strength, and overall functional mobility.     Time  2    Period  Weeks    Status  On-going      PT SHORT TERM GOAL #2   Title  Patient will demonstrate left/right knee extension/flexion active range of motion of at least 7-110 degrees to assist with more normalized gait pattern and stair ambulation.    Baseline  initial - 11-98    Time  2    Period  Weeks    Status  On-going        PT Long Term Goals - 01/24/18 1122      PT LONG TERM GOAL #1   Title  Patient will demonstrate improvement of  MMT grade in all musculature tested as deficient at evaluation to assist with improvement in gait mechanics and stair ambulation.     Baseline  see MMT    Time  4    Period  Weeks    Status  On-going      PT LONG TERM GOAL #2   Title  Patient will perform single limb stance on left/right lower extremity for 10 seconds or greater in order to assist with stair ambulation and decrease risk for falls.    Baseline  initial - Rt 4 sec; Lt 0 sec    Time  4    Period  Weeks    Status  On-going      PT LONG TERM GOAL #3   Title  Patient will improve ROM for left knee extension/flexion to 3-120 degrees to improve squatting, and other functional mobility.    Baseline  initial - 11-98    Time  4    Period  Weeks    Status  On-going      PT LONG TERM GOAL #4   Title  Patient will perform 5 times  sit to stand in 15 seconds or less than as evidence of improved balance and decreased  risk of falls.     Baseline  initial - 24.68 sec    Time  4    Period  Weeks    Status  On-going            Plan - 01/25/18 1320    Clinical Impression Statement  Continued with focus on improving ROM, both extension and flexion.  PT with forward bent posturing and difficulty maintaining erect posturing with added standing exercises.  Pt required manual to reduce hip flexion with hamstring curls.  PT reported feeling her sugar was low and needing a rest, given juice and pt reported feeling better.  Began manual to Lt knee as with noted restricitions and edema.  Audible pop achieved in distal scar with patient reporting improvement, thus resulting in 10 additional degrees of AROM flexion following.  Pt with improved amubulation and decreased pain/stiffness at conclusion of session today.     Rehab Potential  Fair    Clinical Impairments Affecting Rehab Potential  positive - recovery this far and activity level recently; negative, Rt knee DJD, Lt knee AROM this date 11-98    PT Frequency  2x / week   due to high co-pay   PT Duration  4 weeks    PT Treatment/Interventions  ADLs/Self Care Home Management;Aquatic Therapy;Therapeutic activities;Therapeutic exercise;DME Instruction;Gait training;Stair training;Functional mobility training;Patient/family education;Neuromuscular re-education;Balance training;Manual techniques;Scar mobilization;Manual lymph drainage;Passive range of motion;Dry needling;Energy conservation;Joint Manipulations    PT Next Visit Plan  continue with manual to further reduce edema and adhesions/scar tissue.  Next session begin prone knee hang to work on extension, TKE.     PT Home Exercise Plan  initial - supine and seat heel slides; 01/24/18: Quad set 1 x 10 1x/day    Consulted and Agree with Plan of Care  Patient       Patient will benefit from skilled therapeutic intervention in order to improve the following deficits and impairments:  Abnormal gait, Increased edema,  Decreased activity tolerance, Decreased strength, Pain, Decreased knowledge of use of DME, Decreased balance, Decreased mobility, Difficulty walking, Decreased range of motion, Postural dysfunction  Visit Diagnosis: Acute pain of left knee  Stiffness of left knee, not elsewhere classified  Muscle weakness (generalized)     Problem List Patient Active Problem List   Diagnosis Date Noted  . S/P left TKA 12/21/2017  . S/P total knee replacement 12/21/2017  . Need for immunization against influenza 11/14/2017  . Pre-operative clearance 11/14/2017  . Encounter for screening colonoscopy 07/07/2017  . Back pain 04/30/2015  . Seasonal allergies 02/21/2015  . Annual physical exam 09/27/2014  . Osteopenia 06/20/2013  . Hypothyroidism, postradioiodine therapy 12/28/2010  . GERD (gastroesophageal reflux disease) 09/22/2010  . DEGENERATIVE JOINT DISEASE, KNEES, BILATERAL 04/14/2007  . Diabetes mellitus, insulin dependent (IDDM), controlled ([place]) 03/31/2007  . Hyperlipidemia 03/31/2007  . Obesity 03/31/2007  . ESSENTIAL HYPERTENSION, BENIGN 03/31/2007   Teena Irani, PTA/CLT [phone]  Teena Irani 01/25/2018, 1:24 PM  [facility] [address] [place], [place], [address] Phone: [phone]   Fax:  [phone]  Name: Aalivia Mcgraw MRN: [id number] Date of Birth: 1945/08/30

## 2018-02-02 DIAGNOSIS — Z4789 Encounter for other orthopedic aftercare: Secondary | ICD-10-CM | POA: Diagnosis not present

## 2018-02-03 ENCOUNTER — Ambulatory Visit (HOSPITAL_COMMUNITY): Payer: Medicare Other | Attending: Orthopedic Surgery | Admitting: Physical Therapy

## 2018-02-03 DIAGNOSIS — R29898 Other symptoms and signs involving the musculoskeletal system: Secondary | ICD-10-CM | POA: Diagnosis not present

## 2018-02-03 DIAGNOSIS — M6281 Muscle weakness (generalized): Secondary | ICD-10-CM | POA: Diagnosis not present

## 2018-02-03 DIAGNOSIS — R2689 Other abnormalities of gait and mobility: Secondary | ICD-10-CM | POA: Diagnosis not present

## 2018-02-03 DIAGNOSIS — M25662 Stiffness of left knee, not elsewhere classified: Secondary | ICD-10-CM | POA: Insufficient documentation

## 2018-02-03 DIAGNOSIS — M25562 Pain in left knee: Secondary | ICD-10-CM | POA: Diagnosis not present

## 2018-02-03 NOTE — Therapy (Signed)
[place], [place], [address] Phone: [phone]   Fax:  [phone]  Physical Therapy Treatment  Patient Details  Name: Stacey Hanson MRN: [id number] Date of Birth: 02-22-1946 Referring Provider (PT):  Paralee Cancel, MD   Encounter Date: 02/03/2018  PT End of Session - 02/03/18 1156    Visit Number  4    Number of Visits  8    Date for PT Re-Evaluation  02/17/18    Authorization Type  [employer] $40 copay and applies to OOP max, no dedcutible, based on medical necessity, no auth required    Authorization Time Period  cert 79/48/0165 - 53/74/8270    Authorization - Visit Number  4    Authorization - Number of Visits  8    PT Start Time  1115    PT Stop Time  1155    PT Time Calculation (min)  40 min    Activity Tolerance  Patient tolerated treatment well    Behavior During Therapy  [facility] for tasks assessed/performed       Past Medical History:  Diagnosis Date  . BMI 30.0-30.9,adult 2012 215 LBS   2004 198 LBS  . Diabetes mellitus   . 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 H. pylori infection 09/24/2010  . Hyperlipidemia   . Hypertension   . Hypothyroidism   . 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 Knee pain   . Obesity     Past Surgical History:  Procedure Laterality Date  . ABDOMINAL HYSTERECTOMY  1970  . CHOLECYSTECTOMY  OCT 2010 BZ BILIARY DYSKINESIA   CHRONIC CHOLECYSTITIS  . COLONOSCOPY  MAY 2009 SCREENING   pTC TICS, SML IH  . COLONOSCOPY N/A 08/11/2017   Procedure: COLONOSCOPY;  Surgeon: Danie Binder, MD;  [facility];  Service: Endoscopy;  Laterality: N/A;  9:30am  . ESOPHAGOGASTRODUODENOSCOPY  10/03/2010   Procedure: ESOPHAGOGASTRODUODENOSCOPY (EGD);  Surgeon: Dorothyann Peng, MD;  [facility];  Service: Endoscopy;  Laterality: N/A;  . ESOPHAGOGASTRODUODENOSCOPY  12/19/2002   RMR: Normal esophagus/Normal stomach/ Duodenum  large bulbar diverticulum, 1 cm bulbar ulcer with surrounding/ inflammation as described above. Normal D2  . POLYPECTOMY  08/11/2017   Procedure: POLYPECTOMY;  Surgeon: Danie Binder, MD;  [facility];  Service: Endoscopy;;  cecal   . SAVORY DILATION  10/03/2010   Procedure: SAVORY DILATION;  Surgeon: Dorothyann Peng, MD;  [facility];  Service: Endoscopy;;  . TOTAL ABDOMINAL HYSTERECTOMY  1970  . TOTAL KNEE ARTHROPLASTY Left 12/21/2017   Procedure: LEFT TOTAL KNEE ARTHROPLASTY;  Surgeon: Paralee Cancel, MD;  [facility];  Service: Orthopedics;  Laterality: Left;  70 mins block  . UPPER GASTROINTESTINAL ENDOSCOPY  OCT 2004 RMR   DUO TIC, & ULCER    There were no vitals filed for this visit.  Subjective Assessment - 02/03/18 1121    Subjective  Pt reports a little naggy pain today, 6/10.    Currently in Pain?  Yes    Pain Score  6     Pain Location  Knee    Pain Orientation  Left    Pain Descriptors / Indicators  Aching;Tightness;Sore                       [facility] Adult PT Treatment/Exercise - 02/03/18 0001      Exercises  Exercises  Knee/Hip      Knee/Hip Exercises: Stretches   Passive Hamstring Stretch  Left;3 reps;30 seconds    Passive Hamstring Stretch Limitations  standing 12"    Knee: Self-Stretch to increase Flexion  Left;10 seconds;Other (comment)    Knee: Self-Stretch Limitations  10 reps    Gastroc Stretch  Both;3 reps;30 seconds    Gastroc Stretch Limitations  slant board      Knee/Hip Exercises: Aerobic   Stationary Bike  seat 18 full revolutions 2 minutes      Knee/Hip Exercises: Standing   Heel Raises  Both;10 reps    Heel Raises Limitations  toeraises 10 reps    Knee Flexion  Left;15 reps      Knee/Hip Exercises: Seated   Long Arc Quad  Left;10 reps      Knee/Hip Exercises: Supine   Quad Sets  Strengthening;Left;1 set;10 reps    [employer] Limitations  5" holds    Short Arc [employer]  Strengthening;AROM;Left;1  set;10 reps    Short Arc Quad Sets Limitations  5" holds    Knee Extension  AROM    Knee Extension Limitations  10    Knee Flexion  AROM    Knee Flexion Limitations  108      Manual Therapy   Manual Therapy  Myofascial release;Edema management    Manual therapy comments  completed seperate from all other skilled interventions    Edema Management  retro massage to reduce edema    Myofascial Release  to scar and perimeter to reduce adhesions               PT Short Term Goals - 01/24/18 1121      PT SHORT TERM GOAL #1   Title  Patient will demonstrate understanding and report regular compliance with HEP to improve knee AROM, lower extremity strength, and overall functional mobility.     Time  2    Period  Weeks    Status  On-going      PT SHORT TERM GOAL #2   Title  Patient will demonstrate left/right knee extension/flexion active range of motion of at least 7-110 degrees to assist with more normalized gait pattern and stair ambulation.    Baseline  initial - 11-98    Time  2    Period  Weeks    Status  On-going        PT Long Term Goals - 01/24/18 1122      PT LONG TERM GOAL #1   Title  Patient will demonstrate improvement of  MMT grade in all musculature tested as deficient at evaluation to assist with improvement in gait mechanics and stair ambulation.     Baseline  see MMT    Time  4    Period  Weeks    Status  On-going      PT LONG TERM GOAL #2   Title  Patient will perform single limb stance on left/right lower extremity for 10 seconds or greater in order to assist with stair ambulation and decrease risk for falls.    Baseline  initial - Rt 4 sec; Lt 0 sec    Time  4    Period  Weeks    Status  On-going      PT LONG TERM GOAL #3   Title  Patient will improve ROM for left knee extension/flexion to 3-120 degrees to improve squatting, and other functional mobility.    Baseline  initial - 11-98  Time  4    Period  Weeks    Status  On-going      PT  LONG TERM GOAL #4   Title  Patient will perform 5 times sit to stand in 15 seconds or less than as evidence of improved balance and decreased risk of falls.     Baseline  initial - 24.68 sec    Time  4    Period  Weeks    Status  On-going            Plan - 02/03/18 1157    Clinical Impression Statement  contiued with primary focus on improving ROM.  Flexion is making great gains at 108 today.  Extension continues to lag at 10 degrees from neutral.  [place] to work on gait with more upright posturing.  Manual contiued ot decrease restrictions/adhesions and less discomfort with manual today.  Added bike today to further work on ROM.  May benefit from prone lying/manual to posterior knee to help increase extension.     Rehab Potential  Fair    Clinical Impairments Affecting Rehab Potential  positive - recovery this far and activity level recently; negative, Rt knee DJD, Lt knee AROM this date 11-98    PT Frequency  2x / week   due to high co-pay   PT Duration  4 weeks    PT Treatment/Interventions  ADLs/Self Care Home Management;Aquatic Therapy;Therapeutic activities;Therapeutic exercise;DME Instruction;Gait training;Stair training;Functional mobility training;Patient/family education;Neuromuscular re-education;Balance training;Manual techniques;Scar mobilization;Manual lymph drainage;Passive range of motion;Dry needling;Energy conservation;Joint Manipulations    PT Next Visit Plan  continue with manual to further reduce edema and adhesions/scar tissue.  Next session begin prone knee hang to work on extension, TKE.     PT Home Exercise Plan  initial - supine and seat heel slides; 01/24/18: Quad set 1 x 10 1x/day    Consulted and Agree with Plan of Care  Patient       Patient will benefit from skilled therapeutic intervention in order to improve the following deficits and impairments:  Abnormal gait, Increased edema, Decreased activity tolerance, Decreased strength, Pain, Decreased  knowledge of use of DME, Decreased balance, Decreased mobility, Difficulty walking, Decreased range of motion, Postural dysfunction  Visit Diagnosis: Acute pain of left knee  Stiffness of left knee, not elsewhere classified  Muscle weakness (generalized)  Other symptoms and signs involving the musculoskeletal system  Other abnormalities of gait and mobility     Problem List Patient Active Problem List   Diagnosis Date Noted  . S/P left TKA 12/21/2017  . S/P total knee replacement 12/21/2017  . Need for immunization against influenza 11/14/2017  . Pre-operative clearance 11/14/2017  . Encounter for screening colonoscopy 07/07/2017  . Back pain 04/30/2015  . Seasonal allergies 02/21/2015  . Annual physical exam 09/27/2014  . Osteopenia 06/20/2013  . Hypothyroidism, postradioiodine therapy 12/28/2010  . GERD (gastroesophageal reflux disease) 09/22/2010  . DEGENERATIVE JOINT DISEASE, KNEES, BILATERAL 04/14/2007  . Diabetes mellitus, insulin dependent (IDDM), controlled ([place]) 03/31/2007  . Hyperlipidemia 03/31/2007  . Obesity 03/31/2007  . ESSENTIAL HYPERTENSION, BENIGN 03/31/2007   Teena Irani, PTA/CLT [phone]  Teena Irani 02/03/2018, 11:59 AM  [place], [place], [address] Phone: [phone]   Fax:  [phone]  Name: Stacey Hanson MRN: [id number] Date of Birth: Feb 21, 1946

## 2018-02-07 ENCOUNTER — Ambulatory Visit (HOSPITAL_COMMUNITY): Payer: Medicare Other

## 2018-02-07 DIAGNOSIS — R29898 Other symptoms and signs involving the musculoskeletal system: Secondary | ICD-10-CM

## 2018-02-07 DIAGNOSIS — M25662 Stiffness of left knee, not elsewhere classified: Secondary | ICD-10-CM | POA: Diagnosis not present

## 2018-02-07 DIAGNOSIS — M25562 Pain in left knee: Secondary | ICD-10-CM | POA: Diagnosis not present

## 2018-02-07 DIAGNOSIS — R2689 Other abnormalities of gait and mobility: Secondary | ICD-10-CM

## 2018-02-07 DIAGNOSIS — M6281 Muscle weakness (generalized): Secondary | ICD-10-CM | POA: Diagnosis not present

## 2018-02-07 NOTE — Therapy (Signed)
[place], [place], [address] Phone: [phone]   Fax:  [phone]  Physical Therapy Treatment/Re-Assessment  Patient Details  Name: Stacey Hanson MRN: [id number] Date of Birth: October 13, 1945 Referring Provider (PT):  Paralee Cancel, MD  Progress Note Reporting Period 01/20/2018 to 02/07/2018  See note below for Objective Data and Assessment of Progress/Goals.     Encounter Date: 02/07/2018  PT End of Session - 02/07/18 1035    Visit Number  5    Number of Visits  8    Date for PT Re-Evaluation  02/17/18   mini-reassessment completed 02/07/2018   Authorization Type  [employer] $40 copay and applies to OOP max, no dedcutible, based on medical necessity, no auth required    Authorization Time Period  cert 72/62/0355 - 97/41/6384    Authorization - Visit Number  5    Authorization - Number of Visits  8    PT Start Time  1033    PT Stop Time  1111    PT Time Calculation (min)  38 min    Activity Tolerance  Patient tolerated treatment well    Behavior During Therapy  WFL for tasks assessed/performed       Past Medical History:  Diagnosis Date  . BMI 30.0-30.9,adult 2012 215 LBS   2004 198 LBS  . Diabetes mellitus   . 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 H. pylori infection 09/24/2010  . Hyperlipidemia   . Hypertension   . Hypothyroidism   . IDDM (insulin dependent diabetes mellitus) ([place]) 1987   type 2   . Knee pain   . Obesity     Past Surgical History:  Procedure Laterality Date  . ABDOMINAL HYSTERECTOMY  1970  . CHOLECYSTECTOMY  OCT 2010 BZ BILIARY DYSKINESIA   CHRONIC CHOLECYSTITIS  . COLONOSCOPY  MAY 2009 SCREENING   pTC TICS, SML IH  . COLONOSCOPY N/A 08/11/2017   Procedure: COLONOSCOPY;  Surgeon: Danie Binder, MD;  [facility];  Service: Endoscopy;  Laterality: N/A;  9:30am  . ESOPHAGOGASTRODUODENOSCOPY  10/03/2010   Procedure: ESOPHAGOGASTRODUODENOSCOPY (EGD);   Surgeon: Dorothyann Peng, MD;  [facility]: [facility];  Service: Endoscopy;  Laterality: N/A;  . ESOPHAGOGASTRODUODENOSCOPY  12/19/2002   RMR: Normal esophagus/Normal stomach/ Duodenum large bulbar diverticulum, 1 cm bulbar ulcer with surrounding/ inflammation as described above. Normal D2  . POLYPECTOMY  08/11/2017   Procedure: POLYPECTOMY;  Surgeon: Danie Binder, MD;  [facility];  Service: Endoscopy;;  cecal   . SAVORY DILATION  10/03/2010   Procedure: SAVORY DILATION;  Surgeon: Dorothyann Peng, MD;  [facility];  Service: Endoscopy;;  . TOTAL ABDOMINAL HYSTERECTOMY  1970  . TOTAL KNEE ARTHROPLASTY Left 12/21/2017   Procedure: LEFT TOTAL KNEE ARTHROPLASTY;  Surgeon: Paralee Cancel, MD;  Location: [facility];  Service: Orthopedics;  Laterality: Left;  70 mins block  . UPPER GASTROINTESTINAL ENDOSCOPY  OCT 2004 RMR   DUO TIC, & ULCER    There were no vitals filed for this visit.  Subjective Assessment - 02/07/18 1038    Subjective  Pt reoprts 5/10 pain today; doesn't like the rainy weather. Saw her surgeon last week and he wants her to continue 2x/wk for 4 weeks. Started walking with the [facility] last Thursday feels it is going pretty well.    Currently in Pain?  Yes    Pain Score  5  Pain Location  Knee    Pain Orientation  Left    Pain Descriptors / Indicators  Aching;Tightness;Sore    Pain Type  Surgical pain                       [facility] Adult PT Treatment/Exercise - 02/07/18 0001      Knee/Hip Exercises: Stretches   Passive Hamstring Stretch  Left;3 reps;30 seconds    Passive Hamstring Stretch Limitations  standing 12"    Knee: Self-Stretch to increase Flexion  Left;10 seconds;Other (comment)    Knee: Self-Stretch Limitations  10 reps    Gastroc Stretch  Both;3 reps;30 seconds    Gastroc Stretch Limitations  slant board      Knee/Hip Exercises: Standing   Heel Raises  Both;10 reps    Heel Raises Limitations  toeraises 10 reps    Knee Flexion   Left;15 reps      Knee/Hip Exercises: Seated   Long Arc Quad  Left;10 reps      Knee/Hip Exercises: Supine   Quad Sets  Strengthening;Left;1 set;10 reps    Quad Sets Limitations  5" holds    Short Arc Quad Sets  Strengthening;AROM;Left;1 set;10 reps    Short Arc Quad Sets Limitations  5" holds    Knee Extension  AROM    Knee Flexion  AROM             PT Education - 02/07/18 1219    Education Details  Discussed purpose and technique of interventions throughout session. Added prone lying to HEP.    Person(s) Educated  Patient    Methods  Explanation;Demonstration;Handout    Comprehension  Returned demonstration       PT Short Term Goals - 02/07/18 1225      PT SHORT TERM GOAL #1   Title  Patient will demonstrate understanding and report regular compliance with HEP to improve knee AROM, lower extremity strength, and overall functional mobility.     Time  2    Period  Weeks    Status  Achieved      PT SHORT TERM GOAL #2   Title  Patient will demonstrate left/right knee extension/flexion active range of motion of at least 7-110 degrees to assist with more normalized gait pattern and stair ambulation.    Baseline  initial - 11-98; 02/07/18 - 8-110    Time  2    Period  Weeks    Status  Partially Met        PT Long Term Goals - 02/07/18 1227      PT LONG TERM GOAL #1   Title  Patient will demonstrate improvement of  MMT grade in all musculature tested as deficient at evaluation to assist with improvement in gait mechanics and stair ambulation.     Baseline  see MMT    Time  4    Period  Weeks    Status  On-going      PT LONG TERM GOAL #2   Title  Patient will perform single limb stance on left/right lower extremity for 10 seconds or greater in order to assist with stair ambulation and decrease risk for falls.    Baseline  initial - Rt 4 sec; Lt 0 sec    Time  4    Period  Weeks    Status  On-going      PT LONG TERM GOAL #3   Title  Patient will improve ROM  for left knee   extension/flexion to 3-120 degrees to improve squatting, and other functional mobility.    Baseline  initial - 11-98    Time  4    Period  Weeks    Status  On-going      PT LONG TERM GOAL #4   Title  Patient will perform 5 times sit to stand in 15 seconds or less than as evidence of improved balance and decreased risk of falls.     Baseline  initial - 24.68 sec    Time  4    Period  Weeks    Status  On-going            Plan - 02/07/18 1036    Clinical Impression Statement  Mini-reassessment performed. Continued with primary focus on improving ROM. Flexion is making good gains at 110 post manual therapy and exercises today. Extension continues to lag at 8-10 degrees from neutral. Continued to work on gait with more upright posturing. Manual continued to decrease restrictions/adhesions and less discomfort with manual today. Soft tissue mobilization performed in prone to proximal calf and distal hamstrings to help increase extension. Continue with current plan, progress as able. Maintain focus on AROM. Patient may benefit from trial of [place] e-stim to facilitate VMO strength and control for greater knee extension.    Rehab Potential  Fair    Clinical Impairments Affecting Rehab Potential  positive - recovery this far and activity level recently; negative, Rt knee DJD, Lt knee AROM this date 11-98    PT Frequency  2x / week   due to high co-pay   PT Duration  4 weeks    PT Treatment/Interventions  ADLs/Self Care Home Management;Aquatic Therapy;Therapeutic activities;Therapeutic exercise;DME Instruction;Gait training;Stair training;Functional mobility training;Patient/family education;Neuromuscular re-education;Balance training;Manual techniques;Scar mobilization;Manual lymph drainage;Passive range of motion;Dry needling;Energy conservation;Joint Manipulations;Electrical Stimulation    PT Next Visit Plan  continue with manual to further reduce edema and adhesions/scar tissue.   TKE, VMO strengthening, [place] trial?     PT Home Exercise Plan  initial - supine and seat heel slides; 01/24/18: Quad set 1 x 10 1x/day; 02/07/2018 -  prone lying    Consulted and Agree with Plan of Care  Patient       Patient will benefit from skilled therapeutic intervention in order to improve the following deficits and impairments:  Abnormal gait, Increased edema, Decreased activity tolerance, Decreased strength, Pain, Decreased knowledge of use of DME, Decreased balance, Decreased mobility, Difficulty walking, Decreased range of motion, Postural dysfunction  Visit Diagnosis: Acute pain of left knee  Stiffness of left knee, not elsewhere classified  Muscle weakness (generalized)  Other symptoms and signs involving the musculoskeletal system  Other abnormalities of gait and mobility     Problem List Patient Active Problem List   Diagnosis Date Noted  . S/P left TKA 12/21/2017  . S/P total knee replacement 12/21/2017  . Need for immunization against influenza 11/14/2017  . Pre-operative clearance 11/14/2017  . Encounter for screening colonoscopy 07/07/2017  . Back pain 04/30/2015  . Seasonal allergies 02/21/2015  . Annual physical exam 09/27/2014  . Osteopenia 06/20/2013  . Hypothyroidism, postradioiodine therapy 12/28/2010  . GERD (gastroesophageal reflux disease) 09/22/2010  . DEGENERATIVE JOINT DISEASE, KNEES, BILATERAL 04/14/2007  . Diabetes mellitus, insulin dependent (IDDM), controlled ([place]) 03/31/2007  . Hyperlipidemia 03/31/2007  . Obesity 03/31/2007  . ESSENTIAL HYPERTENSION, BENIGN 03/31/2007    Stacey Raveling. Hartnett-Rands, MS, PT Per [place] #[id number] 02/07/2018, 12:31 PM  [facility] [address] [place], [place], [address] Phone: [phone]   Fax:  [phone]  Name: Stacey Hanson MRN: [id number] Date of Birth: 03/24/1945   

## 2018-02-07 NOTE — Patient Instructions (Signed)
Lying on your stomach across your bed with lower legs hanging off for 5-10 minutes. Try for 10 minutes 2x/day.

## 2018-02-09 ENCOUNTER — Encounter: Payer: Self-pay | Admitting: Family Medicine

## 2018-02-09 ENCOUNTER — Ambulatory Visit (INDEPENDENT_AMBULATORY_CARE_PROVIDER_SITE_OTHER): Payer: Medicare Other | Admitting: Family Medicine

## 2018-02-09 ENCOUNTER — Ambulatory Visit (INDEPENDENT_AMBULATORY_CARE_PROVIDER_SITE_OTHER): Payer: Medicare Other

## 2018-02-09 ENCOUNTER — Other Ambulatory Visit: Payer: Self-pay | Admitting: Family Medicine

## 2018-02-09 VITALS — BP 115/72 | HR 92 | Resp 12 | Ht 69.0 in | Wt 208.0 lb

## 2018-02-09 VITALS — BP 110/80 | HR 100 | Resp 12 | Ht 69.0 in | Wt 208.0 lb

## 2018-02-09 DIAGNOSIS — E890 Postprocedural hypothyroidism: Secondary | ICD-10-CM

## 2018-02-09 DIAGNOSIS — Z794 Long term (current) use of insulin: Secondary | ICD-10-CM

## 2018-02-09 DIAGNOSIS — E669 Obesity, unspecified: Secondary | ICD-10-CM

## 2018-02-09 DIAGNOSIS — IMO0001 Reserved for inherently not codable concepts without codable children: Secondary | ICD-10-CM

## 2018-02-09 DIAGNOSIS — I10 Essential (primary) hypertension: Secondary | ICD-10-CM

## 2018-02-09 DIAGNOSIS — Z0000 Encounter for general adult medical examination without abnormal findings: Secondary | ICD-10-CM

## 2018-02-09 DIAGNOSIS — E119 Type 2 diabetes mellitus without complications: Secondary | ICD-10-CM

## 2018-02-09 DIAGNOSIS — E1159 Type 2 diabetes mellitus with other circulatory complications: Secondary | ICD-10-CM | POA: Diagnosis not present

## 2018-02-09 DIAGNOSIS — E782 Mixed hyperlipidemia: Secondary | ICD-10-CM

## 2018-02-09 DIAGNOSIS — E66811 Obesity, class 1: Secondary | ICD-10-CM

## 2018-02-09 MED ORDER — UNABLE TO FIND: 0 refills | Status: DC

## 2018-02-09 NOTE — Patient Instructions (Signed)
Stacey Hanson , Thank you for taking time to come for your Medicare Wellness Visit. I appreciate your ongoing commitment to your health goals. Please review the following plan we discussed and let me know if I can assist you in the future.   Screening recommendations/referrals: Colonoscopy: up to date  Mammogram: up to date  Bone Density: up to date  Recommended yearly ophthalmology/optometry visit for glaucoma screening and checkup Recommended yearly dental visit for hygiene and checkup  Vaccinations: Influenza vaccine: up to date  Pneumococcal vaccine: up to date  Tdap vaccine: up to date  Shingles vaccine: up to date     Advanced directives: information given   Conditions/risks identified: Impaired mobility due to post-op, diabetes, hypertension  Next appointment: Wellness visit in one year    Preventive Care 45 Years and Older, Female Preventive care refers to lifestyle choices and visits with your health care provider that can promote health and wellness. What does preventive care include?  A yearly physical exam. This is also called an annual well check.  Dental exams once or twice a year.  Routine eye exams. Ask your health care provider how often you should have your eyes checked.  Personal lifestyle choices, including:  Daily care of your teeth and gums.  Regular physical activity.  Eating a healthy diet.  Avoiding tobacco and drug use.  Limiting alcohol use.  Practicing safe sex.  Taking low-dose aspirin every day.  Taking vitamin and mineral supplements as recommended by your health care provider. What happens during an annual well check? The services and screenings done by your health care provider during your annual well check will depend on your age, overall health, lifestyle risk factors, and family history of disease. Counseling  Your health care provider may ask you questions about your:  Alcohol use.  Tobacco use.  Drug use.  Emotional  well-being.  Home and relationship well-being.  Sexual activity.  Eating habits.  History of falls.  Memory and ability to understand (cognition).  Work and work Statistician.  Reproductive health. Screening  You may have the following tests or measurements:  Height, weight, and BMI.  Blood pressure.  Lipid and cholesterol levels. These may be checked every 5 years, or more frequently if you are over 56 years old.  Skin check.  Lung cancer screening. You may have this screening every year starting at age 70 if you have a 30-pack-year history of smoking and currently smoke or have quit within the past 15 years.  Fecal occult blood test (FOBT) of the stool. You may have this test every year starting at age 82.  Flexible sigmoidoscopy or colonoscopy. You may have a sigmoidoscopy every 5 years or a colonoscopy every 10 years starting at age 74.  Hepatitis C blood test.  Hepatitis B blood test.  Sexually transmitted disease (STD) testing.  Diabetes screening. This is done by checking your blood sugar (glucose) after you have not eaten for a while (fasting). You may have this done every 1-3 years.  Bone density scan. This is done to screen for osteoporosis. You may have this done starting at age 1.  Mammogram. This may be done every 1-2 years. Talk to your health care provider about how often you should have regular mammograms. Talk with your health care provider about your test results, treatment options, and if necessary, the need for more tests. Vaccines  Your health care provider may recommend certain vaccines, such as:  Influenza vaccine. This is recommended every year.  Tetanus, diphtheria, and acellular pertussis (Tdap, Td) vaccine. You may need a Td booster every 10 years.  Zoster vaccine. You may need this after age 22.  Pneumococcal 13-valent conjugate (PCV13) vaccine. One dose is recommended after age 4.  Pneumococcal polysaccharide (PPSV23) vaccine. One  dose is recommended after age 45. Talk to your health care provider about which screenings and vaccines you need and how often you need them. This information is not intended to replace advice given to you by your health care provider. Make sure you discuss any questions you have with your health care provider. Document Released: 03/15/2015 Document Revised: 11/06/2015 Document Reviewed: 12/18/2014 Elsevier Interactive Patient Education  2017 [place] Prevention in the Home Falls can cause injuries. They can happen to people of all ages. There are many things you can do to make your home safe and to help prevent falls. What can I do on the outside of my home?  Regularly fix the edges of walkways and driveways and fix any cracks.  Remove anything that might make you trip as you walk through a door, such as a raised step or threshold.  Trim any bushes or trees on the path to your home.  Use bright outdoor lighting.  Clear any walking paths of anything that might make someone trip, such as rocks or tools.  Regularly check to see if handrails are loose or broken. Make sure that both sides of any steps have handrails.  Any raised decks and porches should have guardrails on the edges.  Have any leaves, snow, or ice cleared regularly.  Use sand or salt on walking paths during winter.  Clean up any spills in your garage right away. This includes oil or grease spills. What can I do in the bathroom?  Use night lights.  Install grab bars by the toilet and in the tub and shower. Do not use towel bars as grab bars.  Use non-skid mats or decals in the tub or shower.  If you need to sit down in the shower, use a plastic, non-slip stool.  Keep the floor dry. Clean up any water that spills on the floor as soon as it happens.  Remove soap buildup in the tub or shower regularly.  Attach bath mats securely with double-sided non-slip rug tape.  Do not have throw rugs and other  things on the floor that can make you trip. What can I do in the bedroom?  Use night lights.  Make sure that you have a light by your bed that is easy to reach.  Do not use any sheets or blankets that are too big for your bed. They should not hang down onto the floor.  Have a firm chair that has side arms. You can use this for support while you get dressed.  Do not have throw rugs and other things on the floor that can make you trip. What can I do in the kitchen?  Clean up any spills right away.  Avoid walking on wet floors.  Keep items that you use a lot in easy-to-reach places.  If you need to reach something above you, use a strong step stool that has a grab bar.  Keep electrical cords out of the way.  Do not use floor polish or wax that makes floors slippery. If you must use wax, use non-skid floor wax.  Do not have throw rugs and other things on the floor that can make you trip. What can I do  with my stairs?  Do not leave any items on the stairs.  Make sure that there are handrails on both sides of the stairs and use them. Fix handrails that are broken or loose. Make sure that handrails are as long as the stairways.  Check any carpeting to make sure that it is firmly attached to the stairs. Fix any carpet that is loose or worn.  Avoid having throw rugs at the top or bottom of the stairs. If you do have throw rugs, attach them to the floor with carpet tape.  Make sure that you have a light switch at the top of the stairs and the bottom of the stairs. If you do not have them, ask someone to add them for you. What else can I do to help prevent falls?  Wear shoes that:  Do not have high heels.  Have rubber bottoms.  Are comfortable and fit you well.  Are closed at the toe. Do not wear sandals.  If you use a stepladder:  Make sure that it is fully opened. Do not climb a closed stepladder.  Make sure that both sides of the stepladder are locked into place.  Ask  someone to hold it for you, if possible.  Clearly mark and make sure that you can see:  Any grab bars or handrails.  First and last steps.  Where the edge of each step is.  Use tools that help you move around (mobility aids) if they are needed. These include:  Canes.  Walkers.  Scooters.  Crutches.  Turn on the lights when you go into a dark area. Replace any light bulbs as soon as they burn out.  Set up your furniture so you have a clear path. Avoid moving your furniture around.  If any of your floors are uneven, fix them.  If there are any pets around you, be aware of where they are.  Review your medicines with your doctor. Some medicines can make you feel dizzy. This can increase your chance of falling. Ask your doctor what other things that you can do to help prevent falls. This information is not intended to replace advice given to you by your health care provider. Make sure you discuss any questions you have with your health care provider. Document Released: 12/13/2008 Document Revised: 07/25/2015 Document Reviewed: 03/23/2014 Elsevier Interactive Patient Education  2017 [employer].

## 2018-02-09 NOTE — Progress Notes (Signed)
Stacey Hanson     MRN: [id number]      DOB: 1945/07/09   HPI Stacey Hanson is here for follow up and re-evaluation of chronic medical conditions, medication management and review of any available recent lab and radiology data.  Preventive health is updated, specifically  Cancer screening and Immunization.   She had a total knee replacement in October which went very well, she was in a rehab facility for 21 days then transitioned to her own home and does out pt therapy. The PT denies any adverse reactions to current medications since the last visit.  There are no new concerns.  There are no specific complaints   ROS Denies recent fever or chills. Denies sinus pressure, nasal congestion, ear pain or sore throat. Denies chest congestion, productive cough or wheezing. Denies chest pains, palpitations and leg swelling Denies abdominal pain, nausea, vomiting,diarrhea or constipation.   Denies dysuria, frequency, hesitancy or incontinence. Denies uncontrolled  joint pain, swelling and does have some  limitation in mobility.of left kneee , but this is much improved Denies headaches, seizures, numbness, or tingling. Denies depression, anxiety or insomnia. Denies skin break down or rash.   PE  BP 110/80   Pulse 100   Resp 12   Ht 5\' 9"  (1.753 m)   Wt 208 lb 0.6 oz (94.4 kg)   SpO2 92% Comment: room air  BMI 30.72 kg/m   Patient alert and oriented and in no cardiopulmonary distress.  HEENT: No facial asymmetry, EOMI,   oropharynx pink and moist.  Neck supple no JVD, no mass.  Chest: Clear to auscultation bilaterally.  CVS: S1, S2 no murmurs, no S3.Regular rate.  ABD: Soft non tender.   Ext: No edema  MS: Adequate ROM spine, shoulders, hips and reduced in left knee.  Skin: Intact, no ulcerations or rash noted.  Psych: Good eye contact, normal affect. Memory intact not anxious or depressed appearing.  CNS: CN 2-12 intact, power,  normal throughout.no focal  deficits noted.   Assessment & Plan  Type 2 diabetes mellitus with vascular disease ([place]) Controlled, no change in medication Stacey Hanson is reminded of the importance of commitment to daily physical activity for 30 minutes or more, as able and the need to limit carbohydrate intake to 30 to 60 grams per meal to help with blood sugar control.   The need to take medication as prescribed, test blood sugar as directed, and to call between visits if there is a concern that blood sugar is uncontrolled is also discussed.   Stacey Hanson is reminded of the importance of daily foot exam, annual eye examination, and good blood sugar, blood pressure and cholesterol control.  Diabetic Labs Latest Ref Rng & Units 02/09/2018 12/23/2017 12/22/2017 12/13/2017 11/06/2017  HbA1c 4.8 - 5.6 % 6.4(H) - - - 7.3(H)  Microalbumin Not estab mg/dL - - - - -  Micro/Creat Ratio <30 mcg/mg creat - - - - -  Chol 100 - 199 mg/dL - - - - 170  HDL >39 mg/dL - - - - 62  Calc LDL 0 - 99 mg/dL - - - - 94  Triglycerides 0 - 149 mg/dL - - - - 69  Creatinine 0.57 - 1.00 mg/dL 1.01(H) 1.07(H) 1.06(H) 0.96 1.06(H)   BP/Weight 02/09/2018 02/09/2018 12/24/2017 12/13/2017 12/03/2017 11/09/2017 5/72/6203  Systolic BP 559 741 638 453 646 803 -  Diastolic BP 72 80 74 82 64 80 -  Wt. (Lbs) 208 208.04 -  205 211 213 211  BMI 30.72 30.72 - 30.27 31.16 31.45 31.16   Foot/eye exam completion dates Latest Ref Rng & Units 11/09/2017 10/04/2017  Eye Exam No Retinopathy - No Retinopathy  Foot exam Order - - -  Foot Form Completion - Done -           Hypothyroidism, postradioiodine therapy Controlled, no change in medication   ESSENTIAL HYPERTENSION, BENIGN Controlled, no change in medication DASH diet and commitment to daily physical activity for a minimum of 30 minutes discussed and encouraged, as a part of hypertension management. The importance of attaining a healthy weight is also discussed.  BP/Weight 02/09/2018 02/09/2018  12/24/2017 12/13/2017 12/03/2017 11/09/2017 1/61/0960  Systolic BP 454 098 119 147 829 562 -  Diastolic BP 72 80 74 82 64 80 -  Wt. (Lbs) 208 208.04 - 205 211 213 211  BMI 30.72 30.72 - 30.27 31.16 31.45 31.16       Hyperlipidemia Controlled, no change in medication Hyperlipidemia:Low fat diet discussed and encouraged.   Lipid Panel  Lab Results  Component Value Date   CHOL 170 11/06/2017   HDL 62 11/06/2017   LDLCALC 94 11/06/2017   TRIG 69 11/06/2017   CHOLHDL 2.7 11/06/2017       Obesity Improved Patient re-educated about  the importance of commitment to a  minimum of 150 minutes of exercise per week.  The importance of healthy food choices with portion control discussed. Encouraged to start a food diary, count calories and to consider  joining a support group. Sample diet sheets offered. Goals set by the patient for the next several months.   Weight /BMI 02/09/2018 02/09/2018 12/13/2017  WEIGHT 208 lb 208 lb 0.6 oz 205 lb  HEIGHT 5\' 9"  5\' 9"  5\' 9"   BMI 30.72 kg/m2 30.72 kg/m2 30.27 kg/m2

## 2018-02-09 NOTE — Progress Notes (Signed)
Subjective:   Arturo Freundlich is a 72 y.o. female who presents for Medicare Annual (Subsequent) preventive examination.  Review of Systems:  Cardiac Risk Factors include: advanced age (>65mn, >>59women);diabetes mellitus;obesity (BMI >30kg/m2);smoking/ tobacco exposure;dyslipidemia;hypertension     Objective:     Vitals: BP 115/72   Pulse 92   Resp 12   Ht '5\' 9"'  (1.753 m)   Wt 208 lb (94.3 kg)   SpO2 97%   BMI 30.72 kg/m   Body mass index is 30.72 kg/m.  Advanced Directives 02/09/2018 01/20/2018 12/21/2017 12/21/2017 12/13/2017 08/11/2017 02/25/2016  Does Patient Have a Medical Advance Directive? No No No No No No No  Would patient like information on creating a medical advance directive? Yes (ED - Information included in AVS) No - Patient declined No - Patient declined No - Patient declined No - Patient declined Yes (MAU/Ambulatory/Procedural Areas - Information given) Yes (MAU/Ambulatory/Procedural Areas - Information given)  Pre-existing out of facility DNR order (yellow form or pink MOST form) - - - - - - -    Tobacco Social History   Tobacco Use  Smoking Status Former Smoker  . Types: Cigarettes  . Last attempt to quit: 02/20/1985  . Years since quitting: 32.9  Smokeless Tobacco Never Used     Counseling given: Not Answered   Clinical Intake:  Pre-visit preparation completed: Yes  Pain : 0-10 Pain Score: 5  Pain Type: Chronic pain Pain Location: Knee Pain Orientation: Left Pain Descriptors / Indicators: Aching Pain Onset: 1 to 4 weeks ago Pain Frequency: Constant Pain Relieving Factors: Hydrocodone   Pain Relieving Factors: Hydrocodone   BMI - recorded: 30.7 Nutritional Status: BMI > 30  Obese Nutritional Risks: None Diabetes: Yes CBG done?: No Did pt. bring in CBG monitor from home?: No  How often do you need to have someone help you when you read instructions, pamphlets, or other written materials from your doctor or pharmacy?: 1 - Never What  is the last grade level you completed in school?: 10th grade   Interpreter Needed?: No  Information entered by :: AFrancena HanlyLPN  Past Medical History:  Diagnosis Date  . BMI 30.0-30.9,adult 2012 215 LBS   2004 198 LBS  . Diabetes mellitus   . 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 H. pylori infection 09/24/2010  . Hyperlipidemia   . Hypertension   . Hypothyroidism   . 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 Knee pain   . Obesity    Past Surgical History:  Procedure Laterality Date  . ABDOMINAL HYSTERECTOMY  1970  . CHOLECYSTECTOMY  OCT 2010 BZ BILIARY DYSKINESIA   CHRONIC CHOLECYSTITIS  . COLONOSCOPY  MAY 2009 SCREENING   pTC TICS, SML IH  . COLONOSCOPY N/A 08/11/2017   Procedure: COLONOSCOPY;  Surgeon: FDanie Binder MD;  [facility];  Service: Endoscopy;  Laterality: N/A;  9:30am  . ESOPHAGOGASTRODUODENOSCOPY  10/03/2010   Procedure: ESOPHAGOGASTRODUODENOSCOPY (EGD);  Surgeon: SDorothyann Peng MD;  [facility];  Service: Endoscopy;  Laterality: N/A;  . ESOPHAGOGASTRODUODENOSCOPY  12/19/2002   RMR: Normal esophagus/Normal stomach/ Duodenum large bulbar diverticulum, 1 cm bulbar ulcer with surrounding/ inflammation as described above. Normal D2  . POLYPECTOMY  08/11/2017   Procedure: POLYPECTOMY;  Surgeon: FDanie Binder MD;  [facility];  Service: Endoscopy;;  cecal   . SAVORY DILATION  10/03/2010   Procedure: SAVORY DILATION;  Surgeon: Dorothyann Peng, MD;  [facility];  Service: Endoscopy;;  . TOTAL ABDOMINAL HYSTERECTOMY  1970  . TOTAL KNEE ARTHROPLASTY Left 12/21/2017   Procedure: LEFT TOTAL KNEE ARTHROPLASTY;  Surgeon: Paralee Cancel, MD;  [facility];  Service: Orthopedics;  Laterality: Left;  70 mins block  . UPPER GASTROINTESTINAL ENDOSCOPY  OCT 2004 RMR   DUO TIC, & ULCER   Family History  Problem Relation Age of Onset  . Diabetes Mother   . Hypertension Mother     . Heart failure Mother   . Cancer Father 18       prostate and colon  . Diabetes Sister   . Hypertension Sister   . Hypertension Sister   . Diabetes Sister   . Diabetes Brother   . Hypertension Brother   . Diabetes Brother   . Hypertension Brother   . Coronary artery disease Brother 35       MI  . Colon cancer Neg Hx   . Colon polyps Neg Hx    Social History   Socioeconomic History  . Marital status: Single    Spouse name: Not on file  . Number of children: 3  . Years of education: 10  . Highest education level: 10th grade  Occupational History  . Occupation: [employer]  . Occupation: Retired   Scientific laboratory technician  . Financial resource strain: Not very hard  . Food insecurity:    Worry: Never true    Inability: Never true  . Transportation needs:    Medical: No    Non-medical: No  Tobacco Use  . Smoking status: Former Smoker    Types: Cigarettes    Last attempt to quit: 02/20/1985    Years since quitting: 32.9  . Smokeless tobacco: Never Used  Substance and Sexual Activity  . Alcohol use: No  . Drug use: No  . Sexual activity: Not Currently  Lifestyle  . Physical activity:    Days per week: 0 days    Minutes per session: 0 min  . Stress: Only a little  Relationships  . Social connections:    Talks on phone: More than three times a week    Gets together: More than three times a week    Attends religious service: More than 4 times per year    Active member of club or organization: Yes    Attends meetings of clubs or organizations: More than 4 times per year    Relationship status: Never married  Other Topics Concern  . Not on file  Social History Narrative   Lives alone     Outpatient Encounter Medications as of 02/09/2018  Medication Sig  . B-D UF III MINI PEN NEEDLES 31G X 5 MM MISC USE FOR TWICE DAILY INJECTIONS OF HUMALOG  . benazepril (LOTENSIN) 40 MG tablet TAKE 1 TABLET(40 MG) BY MOUTH DAILY (Patient taking differently: Take 40 mg by mouth daily. )  .  Calcium Carb-Cholecalciferol (CALCIUM 600/VITAMIN D3 PO) Take 2 tablets by mouth daily.  Marland Kitchen docusate sodium (COLACE) 100 MG capsule Take 1 capsule (100 mg total) by mouth 2 (two) times daily.  . ferrous sulfate (FERROUSUL) 325 (65 FE) MG tablet Take 1 tablet (325 mg total) by mouth 3 (three) times daily with meals.  Marland Kitchen glucose blood (ONE TOUCH ULTRA TEST) test strip USE AS DIRECTED TWICE DAILY dx e11.9  . HUMALOG MIX 75/25 KWIKPEN (75-25) 100 UNIT/ML Kwikpen INJECT 40 UNITS INTO THE SKIN TWICE DAILY (Patient taking differently: Inject  40 Units into the skin 2 (two) times daily before a meal. )  . hydrochlorothiazide (HYDRODIURIL) 25 MG tablet TAKE 1 TABLET(25 MG) BY MOUTH DAILY  . HYDROcodone-acetaminophen (NORCO) 7.5-325 MG tablet Take 1-2 tablets by mouth every 4 (four) hours as needed for moderate pain.  Marland Kitchen levothyroxine (SYNTHROID, LEVOTHROID) 88 MCG tablet TAKE 1 TABLET BY MOUTH EVERY DAY IN THE MORNING (Patient taking differently: Take 88 mcg by mouth daily before breakfast. )  . metFORMIN (GLUCOPHAGE) 1000 MG tablet TAKE 1 TABLET(1000 MG) BY MOUTH TWICE DAILY WITH A MEAL  . Multiple Vitamin (MULTIVITAMIN) capsule Take 1 capsule by mouth daily.    Marland Kitchen omeprazole (PRILOSEC) 20 MG capsule TAKE 1 CAPSULE BY MOUTH EVERY MORNING (Patient taking differently: Take 20 mg by mouth daily. )  . ONETOUCH DELICA LANCETS 62V MISC USE  TO CHECK GLUCOSE TWICE DAILY AS DIRECTED dx e11.9  . pravastatin (PRAVACHOL) 40 MG tablet TAKE 1 TABLET(40 MG) BY MOUTH DAILY   No facility-administered encounter medications on file as of 02/09/2018.     Activities of Daily Living In your present state of health, do you have any difficulty performing the following activities: 02/09/2018 12/21/2017  Hearing? N N  Vision? N N  Difficulty concentrating or making decisions? N N  Walking or climbing stairs? Y Y  Dressing or bathing? N N  Doing errands, shopping? N N  Preparing Food and eating ? N -  Using the Toilet? N -  In  the past six months, have you accidently leaked urine? N -  Do you have problems with loss of bowel control? N -  Managing your Medications? N -  Managing your Finances? N -  Housekeeping or managing your Housekeeping? N -  Some recent data might be hidden    Patient Care Team: Fayrene Helper, MD as PCP - General Herminio Commons, MD as PCP - Cardiology (Cardiology) Danie Binder, MD (Gastroenterology) Leta Baptist, MD as Attending Physician (Otolaryngology) Cassandria Anger, MD (Endocrinology) Carole Civil, MD as Consulting Physician (Orthopedic Surgery) Madelin Headings, DO (Optometry)    Assessment:   This is a routine wellness examination for Kippy.  Exercise Activities and Dietary recommendations Current Exercise Habits: The patient does not participate in regular exercise at present, Exercise limited by: orthopedic condition(s)  Goals    . Increase physical activity    . Patient Stated     Want to get my knees well        Fall Risk Fall Risk  02/09/2018 02/09/2018 08/18/2017 04/08/2017 03/23/2017  Falls in the past year? 0 0 No No No  Number falls in past yr: 0 0 - - -  Injury with Fall? - 0 - - -  Risk for fall due to : Medication side effect;Impaired balance/gait;Impaired mobility;Impaired vision - - - -  Follow up Falls prevention discussed - - - -   Is the patient's home free of loose throw rugs in walkways, pet beds, electrical cords, etc?   no      Grab bars in the bathroom? yes      Handrails on the stairs?   yes      Adequate lighting?   yes  Timed Get Up and Go performed: Patient able to perform in 7 seconds with the help of a cane   Depression Screen PHQ 2/9 Scores 02/09/2018 02/09/2018 08/18/2017 04/08/2017  PHQ - 2 Score 0 4 0 0  PHQ- 9 Score 0 16 - -  Cognitive Function     6CIT Screen 02/09/2018 02/25/2017 02/25/2016  What Year? 0 points 0 points 0 points  What month? 0 points 0 points 0 points  What time? 0 points 0 points 0  points  Count back from 20 0 points 0 points 0 points  Months in reverse 0 points 0 points 0 points  Repeat phrase 0 points 0 points 0 points  Total Score 0 0 0    Immunization History  Administered Date(s) Administered  . Influenza,inj,Quad PF,6+ Mos 11/16/2012, 11/15/2013, 11/14/2014, 11/14/2015, 11/03/2016, 11/09/2017  . Pneumococcal Conjugate-13 10/11/2013  . Pneumococcal Polysaccharide-23 10/07/2011  . Zoster 04/05/2006    Qualifies for Shingles Vaccine? Up to date   Screening Tests Health Maintenance  Topic Date Due  . HEMOGLOBIN A1C  05/07/2018  . OPHTHALMOLOGY EXAM  10/05/2018  . FOOT EXAM  11/10/2018  . MAMMOGRAM  07/22/2019  . TETANUS/TDAP  08/01/2019  . COLONOSCOPY  08/12/2027  . INFLUENZA VACCINE  Completed  . DEXA SCAN  Completed  . Hepatitis C Screening  Completed  . PNA vac Low Risk Adult  Completed    Cancer Screenings: Lung: Low Dose CT Chest recommended if Age 72-80 years, 30 pack-year currently smoking OR have quit w/in 15years. Patient does not qualify. Breast:  Up to date on Mammogram? Yes   Up to date of Bone Density/Dexa? Yes Colorectal: up to date   Additional Screenings:  Hepatitis C Screening: complete      Plan:   Continue to heal from surgery, exercise more    I have personally reviewed and noted the following in the patient's chart:   . Medical and social history . Use of alcohol, tobacco or illicit drugs  . Current medications and supplements . Functional ability and status . Nutritional status . Physical activity . Advanced directives . List of other physicians . Hospitalizations, surgeries, and ER visits in previous 12 months . Vitals . Screenings to include cognitive, depression, and falls . Referrals and appointments  In addition, I have reviewed and discussed with patient certain preventive protocols, quality metrics, and best practice recommendations. A written personalized care plan for preventive services as well as  general preventive health recommendations were provided to patient.     Hayden Pedro, LPN  20/35/[id number]

## 2018-02-09 NOTE — Patient Instructions (Addendum)
Wellness with nurse due in December, pt has transport on a Wednesday ,, please schedule  HBA1C, TSH. cmp and EGFR, and CBC today  F/U in 4.5 months with MD , call if you need me before  Fasting lipid, cmp and EGFr, hBA1C 1 week before follow up visit

## 2018-02-10 ENCOUNTER — Ambulatory Visit (HOSPITAL_COMMUNITY): Payer: Medicare Other | Admitting: Physical Therapy

## 2018-02-10 ENCOUNTER — Encounter (HOSPITAL_COMMUNITY): Payer: Self-pay | Admitting: Physical Therapy

## 2018-02-10 DIAGNOSIS — M6281 Muscle weakness (generalized): Secondary | ICD-10-CM | POA: Diagnosis not present

## 2018-02-10 DIAGNOSIS — M25662 Stiffness of left knee, not elsewhere classified: Secondary | ICD-10-CM | POA: Diagnosis not present

## 2018-02-10 DIAGNOSIS — R29898 Other symptoms and signs involving the musculoskeletal system: Secondary | ICD-10-CM

## 2018-02-10 DIAGNOSIS — R2689 Other abnormalities of gait and mobility: Secondary | ICD-10-CM

## 2018-02-10 DIAGNOSIS — M25562 Pain in left knee: Secondary | ICD-10-CM | POA: Diagnosis not present

## 2018-02-10 LAB — CBC/DIFF AMBIGUOUS DEFAULT
Basophils Absolute: 0.1 10*3/uL (ref 0.0–0.2)
Basos: 1 %
EOS (ABSOLUTE): 0.1 10*3/uL (ref 0.0–0.4)
EOS: 1 %
Hematocrit: 36.9 % (ref 34.0–46.6)
Hemoglobin: 12.1 g/dL (ref 11.1–15.9)
IMMATURE GRANS (ABS): 0 10*3/uL (ref 0.0–0.1)
IMMATURE GRANULOCYTES: 0 %
Lymphocytes Absolute: 2.6 10*3/uL (ref 0.7–3.1)
Lymphs: 30 %
MCH: 29.7 pg (ref 26.6–33.0)
MCHC: 32.8 g/dL (ref 31.5–35.7)
MCV: 90 fL (ref 79–97)
MONOCYTES: 10 %
Monocytes Absolute: 0.9 10*3/uL (ref 0.1–0.9)
Neutrophils Absolute: 5.1 10*3/uL (ref 1.4–7.0)
Neutrophils: 58 %
PLATELETS: 548 10*3/uL — AB (ref 150–450)
RBC: 4.08 x10E6/uL (ref 3.77–5.28)
RDW: 13 % (ref 12.3–15.4)
WBC: 8.7 10*3/uL (ref 3.4–10.8)

## 2018-02-10 LAB — COMPREHENSIVE METABOLIC PANEL
A/G RATIO: 1.4 (ref 1.2–2.2)
ALK PHOS: 120 IU/L — AB (ref 39–117)
ALT: 13 IU/L (ref 0–32)
AST: 18 IU/L (ref 0–40)
Albumin: 4.6 g/dL (ref 3.5–4.8)
BUN/Creatinine Ratio: 15 (ref 12–28)
BUN: 15 mg/dL (ref 8–27)
Bilirubin Total: <0.2 mg/dL (ref 0.0–1.2)
CHLORIDE: 96 mmol/L (ref 96–106)
CO2: 24 mmol/L (ref 20–29)
Calcium: 10.1 mg/dL (ref 8.7–10.3)
Creatinine, Ser: 1.01 mg/dL — ABNORMAL HIGH (ref 0.57–1.00)
GFR calc Af Amer: 64 mL/min/{1.73_m2} (ref >59)
GFR, EST NON AFRICAN AMERICAN: 56 mL/min/{1.73_m2} — AB (ref >59)
GLOBULIN, TOTAL: 3.2 g/dL (ref 1.5–4.5)
Glucose: 104 mg/dL — ABNORMAL HIGH (ref 65–99)
POTASSIUM: 4.1 mmol/L (ref 3.5–5.2)
SODIUM: 138 mmol/L (ref 134–144)
TOTAL PROTEIN: 7.8 g/dL (ref 6.0–8.5)

## 2018-02-10 LAB — HGB A1C W/O EAG: Hgb A1c MFr Bld: 6.4 % — ABNORMAL HIGH (ref 4.8–5.6)

## 2018-02-10 LAB — TSH: TSH: 0.5 u[IU]/mL (ref 0.450–4.500)

## 2018-02-10 LAB — SPECIMEN STATUS REPORT

## 2018-02-10 NOTE — Therapy (Signed)
[place], [place], [address] Phone: [phone]   Fax:  [phone]  Physical Therapy Treatment  Patient Details  Name: Stacey Hanson MRN: [id number] Date of Birth: 1945/11/08 Referring Provider (PT):  Paralee Cancel, MD   Encounter Date: 02/10/2018  PT End of Session - 02/10/18 1119    Visit Number  6    Number of Visits  8    Date for PT Re-Evaluation  02/17/18   mini-reassessment completed 02/07/2018   Authorization Type  [employer] $40 copay and applies to OOP max, no dedcutible, based on medical necessity, no auth required    Authorization Time Period  cert 11/94/1740 - 81/44/8185    Authorization - Visit Number  6    Authorization - Number of Visits  8    PT Start Time  1113    PT Stop Time  1151    PT Time Calculation (min)  38 min    Activity Tolerance  Patient tolerated treatment well    Behavior During Therapy  [facility] for tasks assessed/performed       Past Medical History:  Diagnosis Date  . BMI 30.0-30.9,adult 2012 215 LBS   2004 198 LBS  . Diabetes mellitus   . 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 H. pylori infection 09/24/2010  . Hyperlipidemia   . Hypertension   . Hypothyroidism   . IDDM (insulin dependent diabetes mellitus) ([place]) 1987   type 2   . Knee pain   . Obesity     Past Surgical History:  Procedure Laterality Date  . ABDOMINAL HYSTERECTOMY  1970  . CHOLECYSTECTOMY  OCT 2010 BZ BILIARY DYSKINESIA   CHRONIC CHOLECYSTITIS  . COLONOSCOPY  MAY 2009 SCREENING   pTC TICS, SML IH  . COLONOSCOPY N/A 08/11/2017   Procedure: COLONOSCOPY;  Surgeon: Danie Binder, MD;  [facility];  Service: Endoscopy;  Laterality: N/A;  9:30am  . ESOPHAGOGASTRODUODENOSCOPY  10/03/2010   Procedure: ESOPHAGOGASTRODUODENOSCOPY (EGD);  Surgeon: Dorothyann Peng, MD;  [facility];  Service: Endoscopy;  Laterality: N/A;  . ESOPHAGOGASTRODUODENOSCOPY  12/19/2002   RMR:  Normal esophagus/Normal stomach/ Duodenum large bulbar diverticulum, 1 cm bulbar ulcer with surrounding/ inflammation as described above. Normal D2  . POLYPECTOMY  08/11/2017   Procedure: POLYPECTOMY;  Surgeon: Danie Binder, MD;  [facility];  Service: Endoscopy;;  cecal   . SAVORY DILATION  10/03/2010   Procedure: SAVORY DILATION;  Surgeon: Dorothyann Peng, MD;  [facility];  Service: Endoscopy;;  . TOTAL ABDOMINAL HYSTERECTOMY  1970  . TOTAL KNEE ARTHROPLASTY Left 12/21/2017   Procedure: LEFT TOTAL KNEE ARTHROPLASTY;  Surgeon: Paralee Cancel, MD;  [facility];  Service: Orthopedics;  Laterality: Left;  70 mins block  . UPPER GASTROINTESTINAL ENDOSCOPY  OCT 2004 RMR   DUO TIC, & ULCER    There were no vitals filed for this visit.  Subjective Assessment - 02/10/18 1116    Subjective  Patient reported 5/10 pain.     Currently in Pain?  Yes    Pain Score  5     Pain Location  Knee    Pain Orientation  Left    Pain Descriptors / Indicators  Aching    Pain Type  Chronic pain                       [facility] Adult PT Treatment/Exercise -  02/10/18 0001      Knee/Hip Exercises: Stretches   Passive Hamstring Stretch  Left;3 reps;30 seconds    Passive Hamstring Stretch Limitations  standing 12"    Knee: Self-Stretch to increase Flexion  Left;10 seconds;Other (comment)    Knee: Self-Stretch Limitations  10 reps    Gastroc Stretch  Both;3 reps;30 seconds    Gastroc Stretch Limitations  slant board      Knee/Hip Exercises: Standing   Heel Raises  Both;10 reps    Heel Raises Limitations  toeraises 10 reps    Knee Flexion  Left;15 reps    Rocker Board  2 minutes    Rocker Board Limitations  Lateral with bilateral UE assist      Knee/Hip Exercises: Seated   Long Arc Quad  Left;10 reps      Knee/Hip Exercises: Supine   Quad Sets  Strengthening;Left;1 set;10 reps    [employer] Limitations  5'' holds    Short Arc [employer]  Strengthening;AROM;Left;1  set;10 reps    Short Arc Quad Sets Limitations  5'' holds    Knee Extension  AROM    Knee Extension Limitations  10    Knee Flexion  AROM    Knee Flexion Limitations  108      Manual Therapy   Manual Therapy  Myofascial release;Edema management    Manual therapy comments  completed seperate from all other skilled interventions    Edema Management  retro massage to reduce edema    Myofascial Release  to scar and perimeter to reduce adhesions             PT Education - 02/10/18 1117    Education Details  Discussed purpose and technique of interventions throughout session.     Person(s) Educated  Patient    Methods  Explanation    Comprehension  Verbalized understanding       PT Short Term Goals - 02/07/18 1225      PT SHORT TERM GOAL #1   Title  Patient will demonstrate understanding and report regular compliance with HEP to improve knee AROM, lower extremity strength, and overall functional mobility.     Time  2    Period  Weeks    Status  Achieved      PT SHORT TERM GOAL #2   Title  Patient will demonstrate left/right knee extension/flexion active range of motion of at least 7-110 degrees to assist with more normalized gait pattern and stair ambulation.    Baseline  initial - 11-98; 02/07/18 - 8-110    Time  2    Period  Weeks    Status  Partially Met        PT Long Term Goals - 02/07/18 1227      PT LONG TERM GOAL #1   Title  Patient will demonstrate improvement of  MMT grade in all musculature tested as deficient at evaluation to assist with improvement in gait mechanics and stair ambulation.     Baseline  see MMT    Time  4    Period  Weeks    Status  On-going      PT LONG TERM GOAL #2   Title  Patient will perform single limb stance on left/right lower extremity for 10 seconds or greater in order to assist with stair ambulation and decrease risk for falls.    Baseline  initial - Rt 4 sec; Lt 0 sec    Time  4    Period  Weeks    Status  On-going       PT LONG TERM GOAL #3   Title  Patient will improve ROM for left knee extension/flexion to 3-120 degrees to improve squatting, and other functional mobility.    Baseline  initial - 11-98    Time  4    Period  Weeks    Status  On-going      PT LONG TERM GOAL #4   Title  Patient will perform 5 times sit to stand in 15 seconds or less than as evidence of improved balance and decreased risk of falls.     Baseline  initial - 24.68 sec    Time  4    Period  Weeks    Status  On-going            Plan - 02/10/18 1154    Clinical Impression Statement  This session continued with established plan of care. This session added lateral rockerboard as well as TKE with red theraband to further improve patient's knee mobility. Patient would benefit from continued skilled physical therapy in order to continue progressing patient towards functional goals.     Rehab Potential  Fair    Clinical Impairments Affecting Rehab Potential  positive - recovery this far and activity level recently; negative, Rt knee DJD, Lt knee AROM this date 11-98    PT Frequency  2x / week   due to high co-pay   PT Duration  4 weeks    PT Treatment/Interventions  ADLs/Self Care Home Management;Aquatic Therapy;Therapeutic activities;Therapeutic exercise;DME Instruction;Gait training;Stair training;Functional mobility training;Patient/family education;Neuromuscular re-education;Balance training;Manual techniques;Scar mobilization;Manual lymph drainage;Passive range of motion;Dry needling;Energy conservation;Joint Manipulations;Electrical Stimulation    PT Next Visit Plan  continue with manual to further reduce edema and adhesions/scar tissue.  TKE, VMO strengthening, [place] trial?     PT Home Exercise Plan  initial - supine and seat heel slides; 01/24/18: Quad set 1 x 10 1x/day; 02/07/2018 -  prone lying    Consulted and Agree with Plan of Care  Patient       Patient will benefit from skilled therapeutic intervention in order  to improve the following deficits and impairments:  Abnormal gait, Increased edema, Decreased activity tolerance, Decreased strength, Pain, Decreased knowledge of use of DME, Decreased balance, Decreased mobility, Difficulty walking, Decreased range of motion, Postural dysfunction  Visit Diagnosis: Acute pain of left knee  Stiffness of left knee, not elsewhere classified  Muscle weakness (generalized)  Other symptoms and signs involving the musculoskeletal system  Other abnormalities of gait and mobility     Problem List Patient Active Problem List   Diagnosis Date Noted  . S/P left TKA 12/21/2017  . S/P total knee replacement 12/21/2017  . Need for immunization against influenza 11/14/2017  . Pre-operative clearance 11/14/2017  . Encounter for screening colonoscopy 07/07/2017  . Back pain 04/30/2015  . Seasonal allergies 02/21/2015  . Annual physical exam 09/27/2014  . Osteopenia 06/20/2013  . Hypothyroidism, postradioiodine therapy 12/28/2010  . GERD (gastroesophageal reflux disease) 09/22/2010  . DEGENERATIVE JOINT DISEASE, KNEES, BILATERAL 04/14/2007  . Diabetes mellitus, insulin dependent (IDDM), controlled ([place]) 03/31/2007  . Hyperlipidemia 03/31/2007  . Obesity 03/31/2007  . ESSENTIAL HYPERTENSION, BENIGN 03/31/2007   Clarene Critchley PT, DPT 11:56 AM, 02/10/18 [place], [place], [address] Phone: [phone]   Fax:  [phone]  Name: Hallie Ertl MRN: [id number] Date of Birth: 30-Dec-1945

## 2018-02-14 ENCOUNTER — Encounter: Payer: Self-pay | Admitting: Family Medicine

## 2018-02-14 ENCOUNTER — Ambulatory Visit (HOSPITAL_COMMUNITY): Payer: Medicare Other

## 2018-02-14 ENCOUNTER — Encounter (HOSPITAL_COMMUNITY): Payer: Self-pay

## 2018-02-14 DIAGNOSIS — R2689 Other abnormalities of gait and mobility: Secondary | ICD-10-CM

## 2018-02-14 DIAGNOSIS — M6281 Muscle weakness (generalized): Secondary | ICD-10-CM

## 2018-02-14 DIAGNOSIS — M25662 Stiffness of left knee, not elsewhere classified: Secondary | ICD-10-CM | POA: Diagnosis not present

## 2018-02-14 DIAGNOSIS — R29898 Other symptoms and signs involving the musculoskeletal system: Secondary | ICD-10-CM

## 2018-02-14 DIAGNOSIS — M25562 Pain in left knee: Secondary | ICD-10-CM

## 2018-02-14 NOTE — Assessment & Plan Note (Signed)
Controlled, no change in medication Hyperlipidemia:Low fat diet discussed and encouraged.   Lipid Panel  Lab Results  Component Value Date   CHOL 170 11/06/2017   HDL 62 11/06/2017   LDLCALC 94 11/06/2017   TRIG 69 11/06/2017   CHOLHDL 2.7 11/06/2017

## 2018-02-14 NOTE — Assessment & Plan Note (Signed)
Controlled, no change in medication  

## 2018-02-14 NOTE — Assessment & Plan Note (Signed)
Improved Patient re-educated about  the importance of commitment to a  minimum of 150 minutes of exercise per week.  The importance of healthy food choices with portion control discussed. Encouraged to start a food diary, count calories and to consider  joining a support group. Sample diet sheets offered. Goals set by the patient for the next several months.   Weight /BMI 02/09/2018 02/09/2018 12/13/2017  WEIGHT 208 lb 208 lb 0.6 oz 205 lb  HEIGHT 5\' 9"  5\' 9"  5\' 9"   BMI 30.72 kg/m2 30.72 kg/m2 30.27 kg/m2

## 2018-02-14 NOTE — Assessment & Plan Note (Signed)
Controlled, no change in medication Stacey Hanson is reminded of the importance of commitment to daily physical activity for 30 minutes or more, as able and the need to limit carbohydrate intake to 30 to 60 grams per meal to help with blood sugar control.   The need to take medication as prescribed, test blood sugar as directed, and to call between visits if there is a concern that blood sugar is uncontrolled is also discussed.   Stacey Hanson is reminded of the importance of daily foot exam, annual eye examination, and good blood sugar, blood pressure and cholesterol control.  Diabetic Labs Latest Ref Rng & Units 02/09/2018 12/23/2017 12/22/2017 12/13/2017 11/06/2017  HbA1c 4.8 - 5.6 % 6.4(H) - - - 7.3(H)  Microalbumin Not estab mg/dL - - - - -  Micro/Creat Ratio <30 mcg/mg creat - - - - -  Chol 100 - 199 mg/dL - - - - 170  HDL >39 mg/dL - - - - 62  Calc LDL 0 - 99 mg/dL - - - - 94  Triglycerides 0 - 149 mg/dL - - - - 69  Creatinine 0.57 - 1.00 mg/dL 1.01(H) 1.07(H) 1.06(H) 0.96 1.06(H)   BP/Weight 02/09/2018 02/09/2018 12/24/2017 12/13/2017 12/03/2017 11/09/2017 7/86/7672  Systolic BP 094 709 628 366 294 765 -  Diastolic BP 72 80 74 82 64 80 -  Wt. (Lbs) 208 208.04 - 205 211 213 211  BMI 30.72 30.72 - 30.27 31.16 31.45 31.16   Foot/eye exam completion dates Latest Ref Rng & Units 11/09/2017 10/04/2017  Eye Exam No Retinopathy - No Retinopathy  Foot exam Order - - -  Foot Form Completion - Done -

## 2018-02-14 NOTE — Assessment & Plan Note (Signed)
Controlled, no change in medication DASH diet and commitment to daily physical activity for a minimum of 30 minutes discussed and encouraged, as a part of hypertension management. The importance of attaining a healthy weight is also discussed.  BP/Weight 02/09/2018 02/09/2018 12/24/2017 12/13/2017 12/03/2017 11/09/2017 3/94/3200  Systolic BP 379 444 619 012 224 114 -  Diastolic BP 72 80 74 82 64 80 -  Wt. (Lbs) 208 208.04 - 205 211 213 211  BMI 30.72 30.72 - 30.27 31.16 31.45 31.16

## 2018-02-14 NOTE — Therapy (Signed)
[place], [place], [address] Phone: [phone]   Fax:  [phone]  Physical Therapy Treatment  Patient Details  Name: Stacey Hanson MRN: [id number] Date of Birth: [birth year] Referring Provider (PT):  Paralee Cancel, MD   Encounter Date: 02/14/2018  PT End of Session - 02/14/18 0912    Visit Number  7    Number of Visits  8    Date for PT Re-Evaluation  02/17/18   Minireassess complete 02/07/18   Authorization Type  [employer] $40 copay and applies to OOP max, no dedcutible, based on medical necessity, no auth required    Authorization Time Period  cert 08/26/9483 - 46/27/0350    Authorization - Visit Number  7    Authorization - Number of Visits  8    PT Start Time  0908    PT Stop Time  0950    PT Time Calculation (min)  42 min    Activity Tolerance  Patient tolerated treatment well    Behavior During Therapy  [facility] for tasks assessed/performed       Past Medical History:  Diagnosis Date  . BMI 30.0-30.9,adult 2012 215 LBS   2004 198 LBS  . Diabetes mellitus   . 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 H. pylori infection 09/24/2010  . Hyperlipidemia   . Hypertension   . Hypothyroidism   . IDDM (insulin dependent diabetes mellitus) ([place]) 1987   type 2   . Knee pain   . Obesity     Past Surgical History:  Procedure Laterality Date  . ABDOMINAL HYSTERECTOMY  1970  . CHOLECYSTECTOMY  OCT 2010 BZ BILIARY DYSKINESIA   CHRONIC CHOLECYSTITIS  . COLONOSCOPY  MAY 2009 SCREENING   pTC TICS, SML IH  . COLONOSCOPY N/A 08/11/2017   Procedure: COLONOSCOPY;  Surgeon: Danie Binder, MD;  [facility];  Service: Endoscopy;  Laterality: N/A;  9:30am  . ESOPHAGOGASTRODUODENOSCOPY  10/03/2010   Procedure: ESOPHAGOGASTRODUODENOSCOPY (EGD);  Surgeon: Dorothyann Peng, MD;  [facility];  Service: Endoscopy;  Laterality: N/A;  . ESOPHAGOGASTRODUODENOSCOPY  12/19/2002   RMR: Normal  esophagus/Normal stomach/ Duodenum large bulbar diverticulum, 1 cm bulbar ulcer with surrounding/ inflammation as described above. Normal D2  . POLYPECTOMY  08/11/2017   Procedure: POLYPECTOMY;  Surgeon: Danie Binder, MD;  [facility];  Service: Endoscopy;;  cecal   . SAVORY DILATION  10/03/2010   Procedure: SAVORY DILATION;  Surgeon: Dorothyann Peng, MD;  [facility];  Service: Endoscopy;;  . TOTAL ABDOMINAL HYSTERECTOMY  1970  . TOTAL KNEE ARTHROPLASTY Left 12/21/2017   Procedure: LEFT TOTAL KNEE ARTHROPLASTY;  Surgeon: Paralee Cancel, MD;  [facility];  Service: Orthopedics;  Laterality: Left;  70 mins block  . UPPER GASTROINTESTINAL ENDOSCOPY  OCT 2004 RMR   DUO TIC, & ULCER    There were no vitals filed for this visit.  Subjective Assessment - 02/14/18 0911    Subjective  Pt stated she is feeling good today, no reports of pain mainly stiffness in knee today.    Patient Stated Goals  try to get myself to together to get better with knee and return to prior activiites like going to the [facility] 3-4x/wk.     Currently in Pain?  No/denies                       [facility] Adult  PT Treatment/Exercise - 02/14/18 0001      Knee/Hip Exercises: Stretches   Passive Hamstring Stretch  Left;3 reps;30 seconds    Passive Hamstring Stretch Limitations  standing 12" with gentle over pressure    Knee: Self-Stretch to increase Flexion  Left;10 seconds;Other (comment)    Knee: Self-Stretch Limitations  knee drives on 26EB step 58X 10"    Gastroc Stretch  Both;3 reps;30 seconds    Gastroc Stretch Limitations  slant board      Knee/Hip Exercises: Standing   Heel Raises  Both;15 reps    Heel Raises Limitations  slope heel and toe raises 5" holds    Terminal Knee Extension  10 reps;Theraband    Theraband Level (Terminal Knee Extension)  Level 3 (Green)    Terminal Knee Extension Limitations  5" holds    Rocker Board  2 minutes    Rocker Board Limitations  Lateral  with bilateral UE assist; Max cueing for form      Knee/Hip Exercises: Supine   Scientist, water quality Sets Limitations  4' Russian estim wi    Short Arc [employer]  15 reps    Short Arc Quad Sets Limitations  5" holds    Knee Extension  AROM    Knee Extension Limitations  8 active, 6 with [place] estim    Knee Flexion  AROM    Knee Flexion Limitations  108      Manual Therapy   Manual Therapy  Myofascial release    Manual therapy comments  completed seperate from all other skilled interventions    Myofascial Release  to scar and perimeter to reduce adhesions               PT Short Term Goals - 02/07/18 1225      PT SHORT TERM GOAL #1   Title  Patient will demonstrate understanding and report regular compliance with HEP to improve knee AROM, lower extremity strength, and overall functional mobility.     Time  2    Period  Weeks    Status  Achieved      PT SHORT TERM GOAL #2   Title  Patient will demonstrate left/right knee extension/flexion active range of motion of at least 7-110 degrees to assist with more normalized gait pattern and stair ambulation.    Baseline  initial - 11-98; 02/07/18 - 8-110    Time  2    Period  Weeks    Status  Partially Met        PT Long Term Goals - 02/07/18 1227      PT LONG TERM GOAL #1   Title  Patient will demonstrate improvement of  MMT grade in all musculature tested as deficient at evaluation to assist with improvement in gait mechanics and stair ambulation.     Baseline  see MMT    Time  4    Period  Weeks    Status  On-going      PT LONG TERM GOAL #2   Title  Patient will perform single limb stance on left/right lower extremity for 10 seconds or greater in order to assist with stair ambulation and decrease risk for falls.    Baseline  initial - Rt 4 sec; Lt 0 sec    Time  4    Period  Weeks    Status  On-going      PT LONG TERM GOAL #3   Title  Patient will improve  ROM for left knee extension/flexion to  3-120 degrees to improve squatting, and other functional mobility.    Baseline  initial - 11-98    Time  4    Period  Weeks    Status  On-going      PT LONG TERM GOAL #4   Title  Patient will perform 5 times sit to stand in 15 seconds or less than as evidence of improved balance and decreased risk of falls.     Baseline  initial - 24.68 sec    Time  4    Period  Weeks    Status  On-going            Plan - 02/14/18 1244    Clinical Impression Statement  Session focus wiht knee mobility primarly knee extension as pt has difficulty activately appropriate distal quadricep and demonstrates weakness wiht inability to hold for 5".  Continued wiht TKE and quad strengthening exercises.  Assured PMH and added [place] estim to distal quads to improve activation wiht knee extension.  Improved AROM 6-108 degrees at EOS.  Pt presents with decreased edema this session though does continues to have some scar tissue adhesions, manual MFR complete to address restrictions.  No reports of increased pain through session.      Rehab Potential  Fair    Clinical Impairments Affecting Rehab Potential  positive - recovery this far and activity level recently; negative, Rt knee DJD, Lt knee AROM this date 11-98    PT Frequency  2x / week   due to high co-pay   PT Duration  4 weeks    PT Treatment/Interventions  ADLs/Self Care Home Management;Aquatic Therapy;Therapeutic activities;Therapeutic exercise;DME Instruction;Gait training;Stair training;Functional mobility training;Patient/family education;Neuromuscular re-education;Balance training;Manual techniques;Scar mobilization;Manual lymph drainage;Passive range of motion;Dry needling;Energy conservation;Joint Manipulations;Electrical Stimulation    PT Next Visit Plan  Review goals next session.  continue with manual to further reduce edema and adhesions/scar tissue.  TKE, VMO strengthening, Continue russian until able to complete proper quad set x 5" without  gluteal compensation.      PT Home Exercise Plan  initial - supine and seat heel slides; 01/24/18: Quad set 1 x 10 1x/day; 02/07/2018 -  prone lying       Patient will benefit from skilled therapeutic intervention in order to improve the following deficits and impairments:  Abnormal gait, Increased edema, Decreased activity tolerance, Decreased strength, Pain, Decreased knowledge of use of DME, Decreased balance, Decreased mobility, Difficulty walking, Decreased range of motion, Postural dysfunction  Visit Diagnosis: Acute pain of left knee  Stiffness of left knee, not elsewhere classified  Muscle weakness (generalized)  Other abnormalities of gait and mobility  Other symptoms and signs involving the musculoskeletal system     Problem List Patient Active Problem List   Diagnosis Date Noted  . S/P total knee replacement 12/21/2017  . Back pain 04/30/2015  . Seasonal allergies 02/21/2015  . Osteopenia 06/20/2013  . Hypothyroidism, postradioiodine therapy 12/28/2010  . GERD (gastroesophageal reflux disease) 09/22/2010  . DEGENERATIVE JOINT DISEASE, KNEES, BILATERAL 04/14/2007  . Type 2 diabetes mellitus with vascular disease ([place]) 03/31/2007  . Hyperlipidemia 03/31/2007  . Obesity 03/31/2007  . ESSENTIAL HYPERTENSION, BENIGN 03/31/2007   Ihor Austin, LPTA; [place]  Aldona Lento 02/14/2018, 12:52 PM  [place] [address] [place], [place], [address] Phone: [phone]   Fax:  [phone]  Name: Stacey Hanson MRN: [id number] Date of Birth: [birth year]

## 2018-02-17 ENCOUNTER — Other Ambulatory Visit: Payer: Self-pay

## 2018-02-17 ENCOUNTER — Encounter (HOSPITAL_COMMUNITY): Payer: Self-pay

## 2018-02-17 ENCOUNTER — Ambulatory Visit (HOSPITAL_COMMUNITY): Payer: Medicare Other

## 2018-02-17 DIAGNOSIS — R2689 Other abnormalities of gait and mobility: Secondary | ICD-10-CM

## 2018-02-17 DIAGNOSIS — M25562 Pain in left knee: Secondary | ICD-10-CM

## 2018-02-17 DIAGNOSIS — R29898 Other symptoms and signs involving the musculoskeletal system: Secondary | ICD-10-CM

## 2018-02-17 DIAGNOSIS — M25662 Stiffness of left knee, not elsewhere classified: Secondary | ICD-10-CM

## 2018-02-17 DIAGNOSIS — M6281 Muscle weakness (generalized): Secondary | ICD-10-CM

## 2018-02-17 NOTE — Therapy (Signed)
[place], [place], [address] Phone: [phone]   Fax:  [phone]  Physical Therapy Treatment/Progress Note  Patient Details  Name: Stacey Hanson MRN: [id number] Date of Birth: [birth year] Referring Provider (PT):  Paralee Cancel, MD   Encounter Date: 02/17/2018   Progress Note Reporting Period 01/20/18 to 02/17/18  See note below for Objective Data and Assessment of Progress/Goals.    PT End of Session - 02/17/18 0840    Visit Number  8    Number of Visits  12    Date for PT Re-Evaluation  03/03/18   re-assess complete 02/17/18   Authorization Type  [employer] $40 copay and applies to OOP max, no dedcutible, based on medical necessity, no auth required    Authorization Time Period  cert 51/76/1607 - 37/12/6267; 02/17/18-03/04/18    Authorization - Visit Number  1    Authorization - Number of Visits  10    PT Start Time  4854    PT Stop Time  0901    PT Time Calculation (min)  45 min    Activity Tolerance  Patient tolerated treatment well    Behavior During Therapy  [facility] for tasks assessed/performed       Past Medical History:  Diagnosis Date  . BMI 30.0-30.9,adult 2012 215 LBS   2004 198 LBS  . Diabetes mellitus   . 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 H. pylori infection 09/24/2010  . Hyperlipidemia   . Hypertension   . Hypothyroidism   . IDDM (insulin dependent diabetes mellitus) ([place]) 1987   type 2   . Knee pain   . Obesity     Past Surgical History:  Procedure Laterality Date  . ABDOMINAL HYSTERECTOMY  1970  . CHOLECYSTECTOMY  OCT 2010 BZ BILIARY DYSKINESIA   CHRONIC CHOLECYSTITIS  . COLONOSCOPY  MAY 2009 SCREENING   pTC TICS, SML IH  . COLONOSCOPY N/A 08/11/2017   Procedure: COLONOSCOPY;  Surgeon: Danie Binder, MD;  [facility];  Service: Endoscopy;  Laterality: N/A;  9:30am  . ESOPHAGOGASTRODUODENOSCOPY  10/03/2010   Procedure: ESOPHAGOGASTRODUODENOSCOPY  (EGD);  Surgeon: Dorothyann Peng, MD;  [facility];  Service: Endoscopy;  Laterality: N/A;  . ESOPHAGOGASTRODUODENOSCOPY  12/19/2002   RMR: Normal esophagus/Normal stomach/ Duodenum large bulbar diverticulum, 1 cm bulbar ulcer with surrounding/ inflammation as described above. Normal D2  . POLYPECTOMY  08/11/2017   Procedure: POLYPECTOMY;  Surgeon: Danie Binder, MD;  [facility];  Service: Endoscopy;;  cecal   . SAVORY DILATION  10/03/2010   Procedure: SAVORY DILATION;  Surgeon: Dorothyann Peng, MD;  [facility];  Service: Endoscopy;;  . TOTAL ABDOMINAL HYSTERECTOMY  1970  . TOTAL KNEE ARTHROPLASTY Left 12/21/2017   Procedure: LEFT TOTAL KNEE ARTHROPLASTY;  Surgeon: Paralee Cancel, MD;  [facility];  Service: Orthopedics;  Laterality: Left;  70 mins block  . UPPER GASTROINTESTINAL ENDOSCOPY  OCT 2004 RMR   DUO TIC, & ULCER    There were no vitals filed for this visit.  Subjective Assessment - 02/17/18 0823    Subjective  Patient reports she id soing well and her knee is not hurting today. She reports she is not sleeping well but states it is not from her knee. She reports she performs her HEP daily usually two times per day.    Patient Stated Goals  try to get myself to together to  get better with knee and return to prior activiites like going to the [facility] 3-4x/wk.     Currently in Pain?  No/denies         [facility] PT Assessment - 02/17/18 0001      Assessment   Medical Diagnosis  Lt TKA    Referring Provider (PT)   Paralee Cancel, MD    Onset Date/Surgical Date  12/21/17    Hand Dominance  Right    Next MD Visit  02/04/2018    Prior Therapy  Yes inpatient rehab      Precautions   Precautions  None      Restrictions   Weight Bearing Restrictions  No      Prior Function   Level of Independence  Independent      Cognition   Overall Cognitive Status  Within Functional Limits for tasks assessed      AROM   Right Knee Extension  20   23    Right Knee Flexion  106   104   Left Knee Extension  6   11   Left Knee Flexion  107   98     Strength   Right Hip Flexion  4+/5    Right Hip Extension  4/5   3+   Right Hip ABduction  4+/5   compensating with hip flexors, was 4-   Left Hip Flexion  4+/5   4   Left Hip Extension  4/5   4-   Left Hip ABduction  4+/5   compensating with hip flexors, was 4-   Right Knee Flexion  4/5   3+   Right Knee Extension  5/5   4   Left Knee Flexion  4+/5   4-   Left Knee Extension  5/5   4-   Right Ankle Dorsiflexion  5/5    Left Ankle Dorsiflexion  5/5      Transfers   Five time sit to stand comments   14.7 without UE use, with some use of momentum   was 24.68 with bil UE use     Static Standing Balance   Static Standing Balance -  Activities   Single Leg Stance - Right Leg;Single Leg Stance - Left Leg    Static Standing - Comment/# of Minutes  Rt = 4 sec, Lt = 3 sec        [facility] Adult PT Treatment/Exercise - 02/17/18 0001      Knee/Hip Exercises: Stretches   Passive Hamstring Stretch  Left;3 reps;30 seconds    Passive Hamstring Stretch Limitations  standing 12" with gentle over pressure    Knee: Self-Stretch to increase Flexion  Left;3 reps;30 seconds    Knee: Self-Stretch Limitations  12" step    Other Knee/Hip Stretches  seated hamstring stretch, 2x 30 seconds with AP overpressure to Lt knee   HEP review   Other Knee/Hip Stretches  seated assisted Lt knee flexion stretch, 1x 30 seconds   HEP review     Knee/Hip Exercises: Aerobic   Stationary Bike  seat 17, AROM, full revolutions      Knee/Hip Exercises: Standing   Terminal Knee Extension  10 reps;Theraband    Theraband Level (Terminal Knee Extension)  Level 4 (Blue)    Terminal Knee Extension Limitations  5" holds        PT Education - 02/17/18 0908    Education Details  Educated on improvements with strength and ROM this date. Educated on need for conitnued therpay  to address  ongoing ROM limitations for Lt  knee.    Person(s) Educated  Patient    Methods  Explanation    Comprehension  Verbalized understanding       PT Short Term Goals - 02/17/18 0846      PT SHORT TERM GOAL #1   Title  Patient will demonstrate understanding and report regular compliance with HEP to improve knee AROM, lower extremity strength, and overall functional mobility.     Time  2    Period  Weeks    Status  Achieved      PT SHORT TERM GOAL #2   Title  Patient will demonstrate left/right knee extension/flexion active range of motion of at least 7-110 degrees to assist with more normalized gait pattern and stair ambulation.    Baseline  initial - 11-98; 02/07/18 - 8-110; 02/17/18 = 6-107    Time  2    Period  Weeks    Status  Partially Met        PT Long Term Goals - 02/17/18 0916      PT LONG TERM GOAL #1   Title  Patient will demonstrate improvement of  MMT grade in all musculature tested as deficient at evaluation to assist with improvement in gait mechanics and stair ambulation.     Baseline  see MMT    Time  4    Period  Weeks    Status  Achieved      PT LONG TERM GOAL #2   Title  Patient will perform single limb stance on left/right lower extremity for 10 seconds or greater in order to assist with stair ambulation and decrease risk for falls.    Baseline  Rt 4 sec; Lt 3 sec    Time  4    Period  Weeks    Status  Not Met      PT LONG TERM GOAL #3   Title  Patient will improve ROM for left knee extension/flexion to 3-120 degrees to improve squatting, and other functional mobility.    Baseline  initial - 11-98    Time  4    Period  Weeks    Status  On-going      PT LONG TERM GOAL #4   Title  Patient will perform 5 times sit to stand in 15 seconds or less than as evidence of improved balance and decreased risk of falls.     Baseline  initial - 24.68 sec; 02/17/18 = 14.7    Time  4    Period  Weeks    Status  Achieved       Plan - 02/17/18 0845    Clinical Impression Statement   Re-assessment performed today and Ms. Montville has met her strength related goals demonstrating significant improvement in bil LE strength with MMT and with significantly decreased time for 5 time sit to stand testing this date. She has made significant change in AROM for Lt knee since evaluation however remains limited from 6-107 degrees. She also continues to be limited with balance during SLS and continues to require quad cane during ambulation to improve steadiness. Ms. Kruser will benefit from additional 2 weeks of therapy to continue addressing impairments and progress ROM to improve quality of mobility for improve QOL.    Rehab Potential  Fair    Clinical Impairments Affecting Rehab Potential  positive - recovery this far and activity level recently; negative, Rt knee DJD, Lt knee AROM this date 11-98  PT Frequency  2x / week   due to high co-pay   PT Duration  2 weeks   2 additional weeks   PT Treatment/Interventions  ADLs/Self Care Home Management;Aquatic Therapy;Therapeutic activities;Therapeutic exercise;DME Instruction;Gait training;Stair training;Functional mobility training;Patient/family education;Neuromuscular re-education;Balance training;Manual techniques;Scar mobilization;Manual lymph drainage;Passive range of motion;Dry needling;Energy conservation;Joint Manipulations;Electrical Stimulation;Ultrasound    PT Next Visit Plan  Focus should be placed on manual interventions and exercises to address Lt knee restrictions in flexion and extension. Update HEP for more advanced stretching in prone and supine.     PT Home Exercise Plan  initial - supine and seat heel slides; 01/24/18: Quad set 1 x 10 1x/day; 02/07/2018 -  prone lying; seated hamstring stretch    Consulted and Agree with Plan of Care  Patient       Patient will benefit from skilled therapeutic intervention in order to improve the following deficits and impairments:  Abnormal gait, Increased edema, Decreased activity  tolerance, Decreased strength, Pain, Decreased knowledge of use of DME, Decreased balance, Decreased mobility, Difficulty walking, Decreased range of motion, Postural dysfunction  Visit Diagnosis: Acute pain of left knee  Stiffness of left knee, not elsewhere classified  Muscle weakness (generalized)  Other abnormalities of gait and mobility  Other symptoms and signs involving the musculoskeletal system     Problem List Patient Active Problem List   Diagnosis Date Noted  . S/P total knee replacement 12/21/2017  . Back pain 04/30/2015  . Seasonal allergies 02/21/2015  . Osteopenia 06/20/2013  . Hypothyroidism, postradioiodine therapy 12/28/2010  . GERD (gastroesophageal reflux disease) 09/22/2010  . DEGENERATIVE JOINT DISEASE, KNEES, BILATERAL 04/14/2007  . Type 2 diabetes mellitus with vascular disease ([place]) 03/31/2007  . Hyperlipidemia 03/31/2007  . Obesity 03/31/2007  . ESSENTIAL HYPERTENSION, BENIGN 03/31/2007    Kipp Brood, PT, DPT Physical Therapist with [facility]  02/17/2018 9:12 AM    [place], [place], [address] Phone: [phone]   Fax:  [phone]  Name: Stacey Hanson MRN: [id number] Date of Birth: 10/24/1945

## 2018-02-24 ENCOUNTER — Ambulatory Visit (HOSPITAL_COMMUNITY): Payer: Medicare Other | Admitting: Physical Therapy

## 2018-02-24 DIAGNOSIS — M6281 Muscle weakness (generalized): Secondary | ICD-10-CM | POA: Diagnosis not present

## 2018-02-24 DIAGNOSIS — M25662 Stiffness of left knee, not elsewhere classified: Secondary | ICD-10-CM | POA: Diagnosis not present

## 2018-02-24 DIAGNOSIS — R2689 Other abnormalities of gait and mobility: Secondary | ICD-10-CM | POA: Diagnosis not present

## 2018-02-24 DIAGNOSIS — R29898 Other symptoms and signs involving the musculoskeletal system: Secondary | ICD-10-CM | POA: Diagnosis not present

## 2018-02-24 DIAGNOSIS — M25562 Pain in left knee: Secondary | ICD-10-CM | POA: Diagnosis not present

## 2018-02-24 NOTE — Therapy (Signed)
[place], [place], [address] Phone: [phone]   Fax:  [phone]  Physical Therapy Treatment  Patient Details  Name: Stacey Hanson MRN: [id number] Date of Birth: 10/28/1945 Referring Provider (PT):  Paralee Cancel, MD   Encounter Date: 02/24/2018  PT End of Session - 02/24/18 1159    Visit Number  9    Number of Visits  12    Date for PT Re-Evaluation  03/03/18   re-assess complete 02/17/18   Authorization Type  [employer] $40 copay and applies to OOP max, no dedcutible, based on medical necessity, no auth required    Authorization Time Period  cert 83/38/2505 - 39/76/7341; 02/17/18-03/04/18    Authorization - Visit Number  9    Authorization - Number of Visits  10    PT Start Time  1116    PT Stop Time  1200    PT Time Calculation (min)  44 min    Activity Tolerance  Patient tolerated treatment well    Behavior During Therapy  [facility] for tasks assessed/performed       Past Medical History:  Diagnosis Date  . BMI 30.0-30.9,adult 2012 215 LBS   2004 198 LBS  . Diabetes mellitus   . 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 H. pylori infection 09/24/2010  . Hyperlipidemia   . Hypertension   . Hypothyroidism   . IDDM (insulin dependent diabetes mellitus) ([place]) 1987   type 2   . Knee pain   . Obesity     Past Surgical History:  Procedure Laterality Date  . ABDOMINAL HYSTERECTOMY  1970  . CHOLECYSTECTOMY  OCT 2010 BZ BILIARY DYSKINESIA   CHRONIC CHOLECYSTITIS  . COLONOSCOPY  MAY 2009 SCREENING   pTC TICS, SML IH  . COLONOSCOPY N/A 08/11/2017   Procedure: COLONOSCOPY;  Surgeon: Danie Binder, MD;  [facility];  Service: Endoscopy;  Laterality: N/A;  9:30am  . ESOPHAGOGASTRODUODENOSCOPY  10/03/2010   Procedure: ESOPHAGOGASTRODUODENOSCOPY (EGD);  Surgeon: Dorothyann Peng, MD;  [facility];  Service: Endoscopy;  Laterality: N/A;  . ESOPHAGOGASTRODUODENOSCOPY  12/19/2002   RMR: Normal esophagus/Normal stomach/ Duodenum large bulbar diverticulum, 1 cm bulbar ulcer with surrounding/ inflammation as described above. Normal D2  . POLYPECTOMY  08/11/2017   Procedure: POLYPECTOMY;  Surgeon: Danie Binder, MD;  [facility];  Service: Endoscopy;;  cecal   . SAVORY DILATION  10/03/2010   Procedure: SAVORY DILATION;  Surgeon: Dorothyann Peng, MD;  [facility];  Service: Endoscopy;;  . TOTAL ABDOMINAL HYSTERECTOMY  1970  . TOTAL KNEE ARTHROPLASTY Left 12/21/2017   Procedure: LEFT TOTAL KNEE ARTHROPLASTY;  Surgeon: Paralee Cancel, MD;  [facility];  Service: Orthopedics;  Laterality: Left;  70 mins block  . UPPER GASTROINTESTINAL ENDOSCOPY  OCT 2004 RMR   DUO TIC, & ULCER    There were no vitals filed for this visit.  Subjective Assessment - 02/24/18 1123    Subjective  Pt states she 's doing well today without pain.      Currently in Pain?  No/denies                       [facility] Adult PT Treatment/Exercise - 02/24/18 0001      Knee/Hip Exercises: Stretches   Passive Hamstring Stretch  Left;3 reps;30 seconds    Passive Hamstring Stretch Limitations  standing 12" with gentle over  pressure    Knee: Self-Stretch to increase Flexion  Left;3 reps;30 seconds    Knee: Self-Stretch Limitations  12" step      Knee/Hip Exercises: Standing   Heel Raises  Both;15 reps    Heel Raises Limitations  slope heel and toe raises 5" holds    Terminal Knee Extension  Theraband;20 reps    Theraband Level (Terminal Knee Extension)  Level 4 (Blue)    Terminal Knee Extension Limitations  5" holds      Knee/Hip Exercises: Seated   Long Arc Quad  Left;15 reps;2 sets      Knee/Hip Exercises: Supine   Quad Sets  Strengthening;2 sets;15 reps    Short Arc Quad Sets  Strengthening;2 sets;15 reps    Knee Extension  AROM    Knee Extension Limitations  8    Knee Flexion  AROM    Knee Flexion Limitations  108      Manual Therapy   Manual Therapy   Myofascial release    Manual therapy comments  completed seperate from all other skilled interventions    Myofascial Release  to scar and perimeter to reduce adhesions               PT Short Term Goals - 02/17/18 0846      PT SHORT TERM GOAL #1   Title  Patient will demonstrate understanding and report regular compliance with HEP to improve knee AROM, lower extremity strength, and overall functional mobility.     Time  2    Period  Weeks    Status  Achieved      PT SHORT TERM GOAL #2   Title  Patient will demonstrate left/right knee extension/flexion active range of motion of at least 7-110 degrees to assist with more normalized gait pattern and stair ambulation.    Baseline  initial - 11-98; 02/07/18 - 8-110; 02/17/18 = 6-107    Time  2    Period  Weeks    Status  Partially Met        PT Long Term Goals - 02/17/18 0916      PT LONG TERM GOAL #1   Title  Patient will demonstrate improvement of  MMT grade in all musculature tested as deficient at evaluation to assist with improvement in gait mechanics and stair ambulation.     Baseline  see MMT    Time  4    Period  Weeks    Status  Achieved      PT LONG TERM GOAL #2   Title  Patient will perform single limb stance on left/right lower extremity for 10 seconds or greater in order to assist with stair ambulation and decrease risk for falls.    Baseline  Rt 4 sec; Lt 3 sec    Time  4    Period  Weeks    Status  Not Met      PT LONG TERM GOAL #3   Title  Patient will improve ROM for left knee extension/flexion to 3-120 degrees to improve squatting, and other functional mobility.    Baseline  initial - 11-98    Time  4    Period  Weeks    Status  On-going      PT LONG TERM GOAL #4   Title  Patient will perform 5 times sit to stand in 15 seconds or less than as evidence of improved balance and decreased risk of falls.     Baseline  initial -  24.68 sec; 02/17/18 = 14.7    Time  4    Period  Weeks    Status   Achieved            Plan - 02/24/18 1200    Clinical Impression Statement  continued with primary focus on improivng ROM, quad strength.  8-108 achieved today, AROM.  AS noted in recent re-evaluation, will add additional therex next session.     Rehab Potential  Fair    Clinical Impairments Affecting Rehab Potential  positive - recovery this far and activity level recently; negative, Rt knee DJD, Lt knee AROM this date 11-98    PT Frequency  2x / week   due to high co-pay   PT Duration  2 weeks   2 additional weeks   PT Treatment/Interventions  ADLs/Self Care Home Management;Aquatic Therapy;Therapeutic activities;Therapeutic exercise;DME Instruction;Gait training;Stair training;Functional mobility training;Patient/family education;Neuromuscular re-education;Balance training;Manual techniques;Scar mobilization;Manual lymph drainage;Passive range of motion;Dry needling;Energy conservation;Joint Manipulations;Electrical Stimulation;Ultrasound    PT Next Visit Plan  Focus should be placed on manual intervention and improving AROM.  Also begin static balance activites, wallslides/squats and step ups to help improve quad/functional strength.     PT Home Exercise Plan  initial - supine and seat heel slides; 01/24/18: Quad set 1 x 10 1x/day; 02/07/2018 -  prone lying; seated hamstring stretch    Consulted and Agree with Plan of Care  Patient       Patient will benefit from skilled therapeutic intervention in order to improve the following deficits and impairments:  Abnormal gait, Increased edema, Decreased activity tolerance, Decreased strength, Pain, Decreased knowledge of use of DME, Decreased balance, Decreased mobility, Difficulty walking, Decreased range of motion, Postural dysfunction  Visit Diagnosis: Acute pain of left knee  Stiffness of left knee, not elsewhere classified  Muscle weakness (generalized)     Problem List Patient Active Problem List   Diagnosis Date Noted  . S/P  total knee replacement 12/21/2017  . Back pain 04/30/2015  . Seasonal allergies 02/21/2015  . Osteopenia 06/20/2013  . Hypothyroidism, postradioiodine therapy 12/28/2010  . GERD (gastroesophageal reflux disease) 09/22/2010  . DEGENERATIVE JOINT DISEASE, KNEES, BILATERAL 04/14/2007  . Type 2 diabetes mellitus with vascular disease ([place]) 03/31/2007  . Hyperlipidemia 03/31/2007  . Obesity 03/31/2007  . ESSENTIAL HYPERTENSION, BENIGN 03/31/2007   Stacey Hanson, PTA/CLT [phone]  Stacey Hanson 02/24/2018, 12:02 PM  [place], [place], [address] Phone: [phone]   Fax:  [phone]  Name: Stacey Hanson MRN: [id number] Date of Birth: 1945/09/22

## 2018-02-28 ENCOUNTER — Ambulatory Visit (HOSPITAL_COMMUNITY): Payer: Medicare Other

## 2018-02-28 ENCOUNTER — Encounter (HOSPITAL_COMMUNITY): Payer: Self-pay

## 2018-02-28 DIAGNOSIS — M25562 Pain in left knee: Secondary | ICD-10-CM | POA: Diagnosis not present

## 2018-02-28 DIAGNOSIS — R29898 Other symptoms and signs involving the musculoskeletal system: Secondary | ICD-10-CM

## 2018-02-28 DIAGNOSIS — R2689 Other abnormalities of gait and mobility: Secondary | ICD-10-CM | POA: Diagnosis not present

## 2018-02-28 DIAGNOSIS — M25662 Stiffness of left knee, not elsewhere classified: Secondary | ICD-10-CM | POA: Diagnosis not present

## 2018-02-28 DIAGNOSIS — M6281 Muscle weakness (generalized): Secondary | ICD-10-CM | POA: Diagnosis not present

## 2018-02-28 NOTE — Therapy (Signed)
[place], [place], [address] Phone: [phone]   Fax:  [phone]  Physical Therapy Treatment  Patient Details  Name: Stacey Hanson MRN: [id number] Date of Birth: 1945/12/06 Referring Provider (PT):  Paralee Cancel, MD   Encounter Date: 02/28/2018  PT End of Session - 02/28/18 1121    Visit Number  10    Number of Visits  12    Date for PT Re-Evaluation  03/03/18   re-assess complete 02/17/18   Authorization Type  [employer] $40 copay and applies to OOP max, no dedcutible, based on medical necessity, no auth required    Authorization Time Period  cert 73/22/0254 - 27/08/2374; 02/17/18-03/04/18    Authorization - Visit Number  10    Authorization - Number of Visits  12    PT Start Time  1120    PT Stop Time  1200    PT Time Calculation (min)  40 min    Activity Tolerance  Patient tolerated treatment well    Behavior During Therapy  [facility] for tasks assessed/performed       Past Medical History:  Diagnosis Date  . BMI 30.0-30.9,adult 2012 215 LBS   2004 198 LBS  . Diabetes mellitus   . 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 H. pylori infection 09/24/2010  . Hyperlipidemia   . Hypertension   . Hypothyroidism   . IDDM (insulin dependent diabetes mellitus) ([place]) 1987   type 2   . Knee pain   . Obesity     Past Surgical History:  Procedure Laterality Date  . ABDOMINAL HYSTERECTOMY  1970  . CHOLECYSTECTOMY  OCT 2010 BZ BILIARY DYSKINESIA   CHRONIC CHOLECYSTITIS  . COLONOSCOPY  MAY 2009 SCREENING   pTC TICS, SML IH  . COLONOSCOPY N/A 08/11/2017   Procedure: COLONOSCOPY;  Surgeon: Danie Binder, MD;  [facility];  Service: Endoscopy;  Laterality: N/A;  9:30am  . ESOPHAGOGASTRODUODENOSCOPY  10/03/2010   Procedure: ESOPHAGOGASTRODUODENOSCOPY (EGD);  Surgeon: Dorothyann Peng, MD;  [facility];  Service: Endoscopy;  Laterality: N/A;  . ESOPHAGOGASTRODUODENOSCOPY  12/19/2002    RMR: Normal esophagus/Normal stomach/ Duodenum large bulbar diverticulum, 1 cm bulbar ulcer with surrounding/ inflammation as described above. Normal D2  . POLYPECTOMY  08/11/2017   Procedure: POLYPECTOMY;  Surgeon: Danie Binder, MD;  [facility];  Service: Endoscopy;;  cecal   . SAVORY DILATION  10/03/2010   Procedure: SAVORY DILATION;  Surgeon: Dorothyann Peng, MD;  [facility];  Service: Endoscopy;;  . TOTAL ABDOMINAL HYSTERECTOMY  1970  . TOTAL KNEE ARTHROPLASTY Left 12/21/2017   Procedure: LEFT TOTAL KNEE ARTHROPLASTY;  Surgeon: Paralee Cancel, MD;  [facility];  Service: Orthopedics;  Laterality: Left;  70 mins block  . UPPER GASTROINTESTINAL ENDOSCOPY  OCT 2004 RMR   DUO TIC, & ULCER    There were no vitals filed for this visit.  Subjective Assessment - 02/28/18 1122    Subjective  Pt states that she is doing well. No pain.     Currently in Pain?  No/denies            [facility] Adult PT Treatment/Exercise - 02/28/18 0001      Exercises   Exercises  Knee/Hip      Knee/Hip Exercises: Stretches   Passive Hamstring Stretch  Left;3 reps;30 seconds    Passive Hamstring Stretch Limitations  standing 12" with gentle over  pressure    Quad Stretch  Left;2 reps;30 seconds    Quad Stretch Limitations  prone with rope    Knee: Self-Stretch to increase Flexion  Left;3 reps;30 seconds    Knee: Self-Stretch Limitations  12" step    Gastroc Stretch  Both;3 reps;30 seconds    Gastroc Stretch Limitations  slant board      Knee/Hip Exercises: Standing   Lateral Step Up  Left;15 reps;Step Height: 4";Hand Hold: 2    Lateral Step Up Limitations  cues for TKE at top    Step Down  Left;15 reps;Step Height: 4";Hand Hold: 2    Step Down Limitations  min cues for form/control    SLS  BLE 3x10", 1 fingertip assist      Knee/Hip Exercises: Supine   Quad Sets  Left;15 reps    Quad Sets Limitations  small blue ball under knee for proprioception/tactile cueing    Knee  Extension Limitations  6    Knee Flexion Limitations  110      Manual Therapy   Manual Therapy  Joint mobilization    Manual therapy comments  completed seperate from all other skilled interventions           PT Education - 02/28/18 1122    Education Details  exercise technique, continue HEP    Person(s) Educated  Patient    Methods  Explanation;Demonstration    Comprehension  Verbalized understanding;Returned demonstration       PT Short Term Goals - 02/17/18 0846      PT SHORT TERM GOAL #1   Title  Patient will demonstrate understanding and report regular compliance with HEP to improve knee AROM, lower extremity strength, and overall functional mobility.     Time  2    Period  Weeks    Status  Achieved      PT SHORT TERM GOAL #2   Title  Patient will demonstrate left/right knee extension/flexion active range of motion of at least 7-110 degrees to assist with more normalized gait pattern and stair ambulation.    Baseline  initial - 11-98; 02/07/18 - 8-110; 02/17/18 = 6-107    Time  2    Period  Weeks    Status  Partially Met        PT Long Term Goals - 02/17/18 0916      PT LONG TERM GOAL #1   Title  Patient will demonstrate improvement of  MMT grade in all musculature tested as deficient at evaluation to assist with improvement in gait mechanics and stair ambulation.     Baseline  see MMT    Time  4    Period  Weeks    Status  Achieved      PT LONG TERM GOAL #2   Title  Patient will perform single limb stance on left/right lower extremity for 10 seconds or greater in order to assist with stair ambulation and decrease risk for falls.    Baseline  Rt 4 sec; Lt 3 sec    Time  4    Period  Weeks    Status  Not Met      PT LONG TERM GOAL #3   Title  Patient will improve ROM for left knee extension/flexion to 3-120 degrees to improve squatting, and other functional mobility.    Baseline  initial - 11-98    Time  4    Period  Weeks    Status  On-going      PT  LONG TERM GOAL #4   Title  Patient will perform 5 times sit to stand in 15 seconds or less than as evidence of improved balance and decreased risk of falls.     Baseline  initial - 24.68 sec; 02/17/18 = 14.7    Time  4    Period  Weeks    Status  Achieved            Plan - 02/28/18 1204    Clinical Impression Statement  Continued with established POC focusing on improving overall ROM. Added lat step ups and step downs from 4" step for improved quad strength and extension ROM. Also added SLS for improved quad and functional strength and prone quad stretch for improved overall ROM. Began joint mobs this date for flexion and extension. AROM 6-110deg this date. Added supine heel prop to HEP for extension ROM. Pt due for reassessment next visit as it is her last scheduled appointment.     Rehab Potential  Fair    Clinical Impairments Affecting Rehab Potential  positive - recovery this far and activity level recently; negative, Rt knee DJD, Lt knee AROM this date 11-98    PT Frequency  2x / week   due to high co-pay   PT Duration  2 weeks   2 additional weeks   PT Treatment/Interventions  ADLs/Self Care Home Management;Aquatic Therapy;Therapeutic activities;Therapeutic exercise;DME Instruction;Gait training;Stair training;Functional mobility training;Patient/family education;Neuromuscular re-education;Balance training;Manual techniques;Scar mobilization;Manual lymph drainage;Passive range of motion;Dry needling;Energy conservation;Joint Manipulations;Electrical Stimulation;Ultrasound    PT Next Visit Plan  reassessment; Focus should be placed on manual intervention and improving AROM.  Also begin static balance activites, wallslides/squats and step ups to help improve quad/functional strength.     PT Home Exercise Plan  initial - supine and seat heel slides; 01/24/18: Quad set 1 x 10 1x/day; 02/07/2018 -  prone lying; seated hamstring stretch; 12/30: supine heel prop    Consulted and Agree with  Plan of Care  Patient       Patient will benefit from skilled therapeutic intervention in order to improve the following deficits and impairments:  Abnormal gait, Increased edema, Decreased activity tolerance, Decreased strength, Pain, Decreased knowledge of use of DME, Decreased balance, Decreased mobility, Difficulty walking, Decreased range of motion, Postural dysfunction  Visit Diagnosis: Acute pain of left knee  Stiffness of left knee, not elsewhere classified  Muscle weakness (generalized)  Other abnormalities of gait and mobility  Other symptoms and signs involving the musculoskeletal system     Problem List Patient Active Problem List   Diagnosis Date Noted  . S/P total knee replacement 12/21/2017  . Back pain 04/30/2015  . Seasonal allergies 02/21/2015  . Osteopenia 06/20/2013  . Hypothyroidism, postradioiodine therapy 12/28/2010  . GERD (gastroesophageal reflux disease) 09/22/2010  . DEGENERATIVE JOINT DISEASE, KNEES, BILATERAL 04/14/2007  . Type 2 diabetes mellitus with vascular disease ([place]) 03/31/2007  . Hyperlipidemia 03/31/2007  . Obesity 03/31/2007  . ESSENTIAL HYPERTENSION, BENIGN 03/31/2007        Geraldine Solar PT, DPT  [facility] [address] [place], [place], [address] Phone: [phone]   Fax:  [phone]  Name: Stacey Hanson MRN: [id number] Date of Birth: Jan 16, 1946

## 2018-03-03 ENCOUNTER — Encounter (HOSPITAL_COMMUNITY): Payer: Self-pay | Admitting: Physical Therapy

## 2018-03-03 ENCOUNTER — Ambulatory Visit: Payer: Medicare Other | Admitting: Family Medicine

## 2018-03-03 ENCOUNTER — Ambulatory Visit (HOSPITAL_COMMUNITY): Payer: Medicare Other | Attending: Orthopedic Surgery | Admitting: Physical Therapy

## 2018-03-03 DIAGNOSIS — R2689 Other abnormalities of gait and mobility: Secondary | ICD-10-CM | POA: Diagnosis not present

## 2018-03-03 DIAGNOSIS — M25662 Stiffness of left knee, not elsewhere classified: Secondary | ICD-10-CM | POA: Insufficient documentation

## 2018-03-03 DIAGNOSIS — R29898 Other symptoms and signs involving the musculoskeletal system: Secondary | ICD-10-CM | POA: Insufficient documentation

## 2018-03-03 DIAGNOSIS — M25562 Pain in left knee: Secondary | ICD-10-CM | POA: Diagnosis not present

## 2018-03-03 DIAGNOSIS — M6281 Muscle weakness (generalized): Secondary | ICD-10-CM | POA: Diagnosis not present

## 2018-03-03 NOTE — Therapy (Signed)
[facility] [address] [place], [place], [address] Phone: [phone]   Fax:  [phone]  Physical Therapy Treatment/ Re-assessment / Discharge Summary Patient Details  Name: Stacey Hanson MRN: [id number] Date of Birth: 01-19-1946 Referring Provider (PT):  Paralee Cancel, MD   Encounter Date: 03/03/2018   Progress Note Reporting Period 02/17/18 to 03/03/18  See note below for Objective Data and Assessment of Progress/Goals.   PHYSICAL THERAPY DISCHARGE SUMMARY  Visits from Start of Care: 11  Current functional level related to goals / functional outcomes: See below    Remaining deficits: See below   Education / Equipment: Updated HEP, re-evaluation findings, and benefits of physical therapy. See below Plan: Patient agrees to discharge.  Patient goals were partially met. Patient is being discharged due to the patient's request.  ?????           PT End of Session - 03/03/18 1119    Visit Number  11    Number of Visits  12    Date for PT Re-Evaluation  03/03/18   re-assess complete 02/17/18   Authorization Type  [employer] $40 copay and applies to OOP max, no dedcutible, based on medical necessity, no auth required    Authorization Time Period  cert 30/08/6224 - 33/35/4562; 02/17/18-03/04/18    Authorization - Visit Number  11    Authorization - Number of Visits  12    PT Start Time  1115    PT Stop Time  1146    PT Time Calculation (min)  31 min    Activity Tolerance  Patient tolerated treatment well    Behavior During Therapy  WFL for tasks assessed/performed       Past Medical History:  Diagnosis Date  . BMI 30.0-30.9,adult 2012 215 LBS   2004 198 LBS  . Diabetes mellitus   . 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 H. pylori infection 09/24/2010  . Hyperlipidemia   . Hypertension   . Hypothyroidism   . IDDM (insulin dependent diabetes mellitus) ([place]) 1987   type 2   . Knee pain   . Obesity      Past Surgical History:  Procedure Laterality Date  . ABDOMINAL HYSTERECTOMY  1970  . CHOLECYSTECTOMY  OCT 2010 BZ BILIARY DYSKINESIA   CHRONIC CHOLECYSTITIS  . COLONOSCOPY  MAY 2009 SCREENING   pTC TICS, SML IH  . COLONOSCOPY N/A 08/11/2017   Procedure: COLONOSCOPY;  Surgeon: Danie Binder, MD;  [facility];  Service: Endoscopy;  Laterality: N/A;  9:30am  . ESOPHAGOGASTRODUODENOSCOPY  10/03/2010   Procedure: ESOPHAGOGASTRODUODENOSCOPY (EGD);  Surgeon: Dorothyann Peng, MD;  [facility];  Service: Endoscopy;  Laterality: N/A;  . ESOPHAGOGASTRODUODENOSCOPY  12/19/2002   RMR: Normal esophagus/Normal stomach/ Duodenum large bulbar diverticulum, 1 cm bulbar ulcer with surrounding/ inflammation as described above. Normal D2  . POLYPECTOMY  08/11/2017   Procedure: POLYPECTOMY;  Surgeon: Danie Binder, MD;  [facility];  Service: Endoscopy;;  cecal   . SAVORY DILATION  10/03/2010   Procedure: SAVORY DILATION;  Surgeon: Dorothyann Peng, MD;  [facility];  Service: Endoscopy;;  . TOTAL ABDOMINAL HYSTERECTOMY  1970  . TOTAL KNEE ARTHROPLASTY Left 12/21/2017   Procedure: LEFT TOTAL KNEE ARTHROPLASTY;  Surgeon: Paralee Cancel, MD;  [facility];  Service: Orthopedics;  Laterality: Left;  70 mins block  . UPPER GASTROINTESTINAL ENDOSCOPY  OCT 2004 RMR   DUO  TIC, & ULCER    There were no vitals filed for this visit.  Subjective Assessment - 03/03/18 1154    Subjective  Patient reported she is doing well and is pleased with her current functional level. She reported that she is not having any pain in her left knee and would like to be finished with therapy to continue her exercises at home.     Currently in Pain?  No/denies         [facility] PT Assessment - 03/03/18 0001      Assessment   Medical Diagnosis  Lt TKA    Referring Provider (PT)   Paralee Cancel, MD    Onset Date/Surgical Date  12/21/17      Prior Function   Level of Independence   Independent      Cognition   Overall Cognitive Status  Within Functional Limits for tasks assessed      Circumferential Edema   Circumferential - Right  41 cm at joint line    Circumferential - Left   41.5 cm at joint line      AROM   Right Knee Extension  20   was 20   Right Knee Flexion  103   was 106   Left Knee Extension  4   was 6   Left Knee Flexion  110      Strength   Right Hip Flexion  5/5   was 4+   Right Hip Extension  4/5   was 4   Right Hip ABduction  5/5   was 4+   Left Hip Flexion  5/5   was 4+   Left Hip Extension  4/5   was 4   Left Hip ABduction  4+/5   was 4+   Right Knee Flexion  4+/5   was 4   Right Knee Extension  5/5   was 5   Left Knee Flexion  5/5   was 4+   Left Knee Extension  5/5   was 5   Right Ankle Dorsiflexion  5/5   was 5   Left Ankle Dorsiflexion  5/5   was 5     Transfers   Five time sit to stand comments   15 seconds without upper extremities, some noted forward trunk lean   was 14.7 seconds     Ambulation/Gait   Ambulation/Gait  Yes    Ambulation Distance (Feet)  226 Feet    Assistive device  Small based quad cane    Gait Pattern  Antalgic;Trunk flexed    Ambulation Surface  Level;Indoor    Gait velocity  0.57 m/s       Static Standing Balance   Static Standing Balance -  Activities   Single Leg Stance - Right Leg;Single Leg Stance - Left Leg    Static Standing - Comment/# of Minutes  Rt - 0 seconds; Lt. 15 seconds                           PT Education - 03/03/18 1154    Education Details  Educated patient on re-evaluation findings, discussed continued areas of deficits and benefits of physical therapy. Updated HEP.     Person(s) Educated  Patient    Methods  Explanation;Handout    Comprehension  Verbalized understanding       PT Short Term Goals - 03/03/18 1201      PT SHORT TERM GOAL #1   Title  Patient will demonstrate understanding and report regular compliance with HEP to improve  knee AROM, lower extremity strength, and overall functional mobility.     Time  2    Period  Weeks    Status  Achieved      PT SHORT TERM GOAL #2   Title  Patient will demonstrate left/right knee extension/flexion active range of motion of at least 7-110 degrees to assist with more normalized gait pattern and stair ambulation.    Time  2    Period  Weeks    Status  Achieved        PT Long Term Goals - 03/03/18 1202      PT LONG TERM GOAL #1   Title  Patient will demonstrate improvement of  MMT grade in all musculature tested as deficient at evaluation to assist with improvement in gait mechanics and stair ambulation.     Baseline  see MMT    Time  4    Period  Weeks    Status  Achieved      PT LONG TERM GOAL #2   Title  Patient will perform single limb stance on left/right lower extremity for 10 seconds or greater in order to assist with stair ambulation and decrease risk for falls.    Baseline  See objective measures    Time  4    Period  Weeks    Status  Partially Met      PT LONG TERM GOAL #3   Title  Patient will improve ROM for left knee extension/flexion to 3-120 degrees to improve squatting, and other functional mobility.    Baseline  See objective measures    Time  4    Period  Weeks    Status  On-going      PT LONG TERM GOAL #4   Title  Patient will perform 5 times sit to stand in 15 seconds or less than as evidence of improved balance and decreased risk of falls.     Baseline  03/03/17: 15 seconds    Time  4    Period  Weeks    Status  Achieved            Plan - 03/03/18 1208    Clinical Impression Statement  This session performed re-assessment of patient's progress towards goals. Patient achieved 2 out of 2 short term goals. Patient achieved 2 out of 4 long term goals. Patient continued to demonstrate deficits in left knee AROM, however patient is also limited in AROM on the opposite lower extremity. Patient also continued to demonstrate deficits in  single limb stance as well as with gait velocity. Therapist explained benefits of physical therapy and areas of deficits. Patient stated she wished to be finished with therapy and continue home exercises on her own. Therapist updated patient's HEP and discussed importance of continuing exercises as well as following up with her physician regarding her progress and continued pain in non-operated knee. Patient is being discharged due to patient request.     Rehab Potential  Fair    Clinical Impairments Affecting Rehab Potential  positive - recovery this far and activity level recently; negative, Rt knee DJD, Lt knee AROM this date 11-98    PT Frequency  2x / week   due to high co-pay   PT Duration  2 weeks   2 additional weeks   PT Treatment/Interventions  ADLs/Self Care Home Management;Aquatic Therapy;Therapeutic activities;Therapeutic exercise;DME Instruction;Gait training;Stair training;Functional mobility training;Patient/family education;Neuromuscular re-education;Balance training;Manual  techniques;Scar mobilization;Manual lymph drainage;Passive range of motion;Dry needling;Energy conservation;Joint Manipulations;Electrical Stimulation;Ultrasound    PT Next Visit Plan  reassessment; Focus should be placed on manual intervention and improving AROM.  Also begin static balance activites, wallslides/squats and step ups to help improve quad/functional strength.     PT Home Exercise Plan  initial - supine and seat heel slides; 01/24/18: Quad set 1 x 10 1x/day; 02/07/2018 -  prone lying; seated hamstring stretch; 12/30: supine heel prop; 03/03/18: Single limb stance at counter 5x each LE, LAQ in chair    Consulted and Agree with Plan of Care  Patient       Patient will benefit from skilled therapeutic intervention in order to improve the following deficits and impairments:  Abnormal gait, Increased edema, Decreased activity tolerance, Decreased strength, Pain, Decreased knowledge of use of DME, Decreased  balance, Decreased mobility, Difficulty walking, Decreased range of motion, Postural dysfunction  Visit Diagnosis: Acute pain of left knee  Stiffness of left knee, not elsewhere classified  Muscle weakness (generalized)  Other abnormalities of gait and mobility  Other symptoms and signs involving the musculoskeletal system     Problem List Patient Active Problem List   Diagnosis Date Noted  . S/P total knee replacement 12/21/2017  . Back pain 04/30/2015  . Seasonal allergies 02/21/2015  . Osteopenia 06/20/2013  . Hypothyroidism, postradioiodine therapy 12/28/2010  . GERD (gastroesophageal reflux disease) 09/22/2010  . DEGENERATIVE JOINT DISEASE, KNEES, BILATERAL 04/14/2007  . Type 2 diabetes mellitus with vascular disease ([place]) 03/31/2007  . Hyperlipidemia 03/31/2007  . Obesity 03/31/2007  . ESSENTIAL HYPERTENSION, BENIGN 03/31/2007   Clarene Critchley PT, DPT 12:14 PM, 03/03/18 [place], [place], [address] Phone: [phone]   Fax:  [phone]  Name: Stacey Hanson MRN: [id number] Date of Birth: [birth year]

## 2018-03-03 NOTE — Patient Instructions (Addendum)
Single Leg - Eyes Open    Holding support, lift right leg while maintaining balance over other leg. Progress to removing hands from support surface for longer periods of time. Hold__15__ seconds. Repeat __5__ times per session. Do __1-2__ sessions per day.  Copyright  VHI. All rights reserved.  KNEE: Extension, Long Arc Quads - Sitting    Raise leg until knee is straight. _15__ reps per set, _1__ sets per day, _7__ days per week  Copyright  VHI. All rights reserved.

## 2018-03-07 ENCOUNTER — Other Ambulatory Visit: Payer: Self-pay | Admitting: Family Medicine

## 2018-03-07 ENCOUNTER — Other Ambulatory Visit: Payer: Self-pay

## 2018-03-07 DIAGNOSIS — M2140 Flat foot [pes planus] (acquired), unspecified foot: Secondary | ICD-10-CM | POA: Diagnosis not present

## 2018-03-07 DIAGNOSIS — E119 Type 2 diabetes mellitus without complications: Secondary | ICD-10-CM | POA: Diagnosis not present

## 2018-03-07 NOTE — Patient Outreach (Signed)
[facility]) Care Management  03/07/2018  Stacey Hanson December 01, 1945 [id number]   Medication Adherence call to Stacey Hanson patient did not answer patient is due on Benazepril 40 mg and Pravastatin 40 mg. Stacey Hanson is showing past due under [facility].   [facility] Management Direct Dial [phone]  Fax [phone] [email or web address]

## 2018-03-11 ENCOUNTER — Telehealth: Payer: Self-pay | Admitting: *Deleted

## 2018-03-11 ENCOUNTER — Other Ambulatory Visit: Payer: Self-pay | Admitting: Family Medicine

## 2018-03-11 MED ORDER — IBUPROFEN 600 MG PO TABS: ORAL_TABLET | ORAL | 1 refills | Status: DC

## 2018-03-11 NOTE — Telephone Encounter (Signed)
Please advise 

## 2018-03-11 NOTE — Telephone Encounter (Signed)
Pt wants 800 mg ibuprofen called in to [facility]

## 2018-03-11 NOTE — Telephone Encounter (Signed)
pls advise sent, but no more than tHREE tabs per week

## 2018-03-11 NOTE — Progress Notes (Signed)
Ibuprofen 600 

## 2018-03-14 ENCOUNTER — Ambulatory Visit: Payer: Medicare Other | Admitting: Family Medicine

## 2018-03-15 DIAGNOSIS — Z96652 Presence of left artificial knee joint: Secondary | ICD-10-CM | POA: Insufficient documentation

## 2018-03-23 ENCOUNTER — Telehealth: Payer: Self-pay

## 2018-03-23 NOTE — Telephone Encounter (Signed)
[facility] - Left Message  

## 2018-04-11 ENCOUNTER — Ambulatory Visit: Payer: Medicare Other | Admitting: "Endocrinology

## 2018-04-13 ENCOUNTER — Other Ambulatory Visit: Payer: Self-pay | Admitting: Family Medicine

## 2018-04-14 ENCOUNTER — Ambulatory Visit (INDEPENDENT_AMBULATORY_CARE_PROVIDER_SITE_OTHER): Payer: Medicare Other | Admitting: "Endocrinology

## 2018-04-14 ENCOUNTER — Encounter: Payer: Self-pay | Admitting: "Endocrinology

## 2018-04-14 VITALS — BP 141/81 | HR 102 | Ht 69.0 in | Wt 209.2 lb

## 2018-04-14 DIAGNOSIS — E890 Postprocedural hypothyroidism: Secondary | ICD-10-CM | POA: Diagnosis not present

## 2018-04-14 MED ORDER — LEVOTHYROXINE SODIUM 88 MCG PO TABS: 88.0000 ug | ORAL_TABLET | Freq: Every day | ORAL | 4 refills | Status: DC

## 2018-04-14 NOTE — Progress Notes (Signed)
LeighAnn Blondina Coderre, CMA  

## 2018-04-15 NOTE — Progress Notes (Signed)
Subjective:    Patient ID: Stacey Hanson, female    DOB: 1945/10/30, PCP Fayrene Helper, MD   Past Medical History:  Diagnosis Date  . BMI 30.0-30.9,adult 2012 215 LBS   2004 198 LBS  . Diabetes mellitus   . 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 H. pylori infection 09/24/2010  . Hyperlipidemia   . Hypertension   . Hypothyroidism   . IDDM (insulin dependent diabetes mellitus) ([place]) 1987   type 2   . Knee pain   . Obesity    Past Surgical History:  Procedure Laterality Date  . ABDOMINAL HYSTERECTOMY  1970  . CHOLECYSTECTOMY  OCT 2010 BZ BILIARY DYSKINESIA   CHRONIC CHOLECYSTITIS  . COLONOSCOPY  MAY 2009 SCREENING   pTC TICS, SML IH  . COLONOSCOPY N/A 08/11/2017   Procedure: COLONOSCOPY;  Surgeon: Danie Binder, MD;  [facility];  Service: Endoscopy;  Laterality: N/A;  9:30am  . ESOPHAGOGASTRODUODENOSCOPY  10/03/2010   Procedure: ESOPHAGOGASTRODUODENOSCOPY (EGD);  Surgeon: Dorothyann Peng, MD;  [facility];  Service: Endoscopy;  Laterality: N/A;  . ESOPHAGOGASTRODUODENOSCOPY  12/19/2002   RMR: Normal esophagus/Normal stomach/ Duodenum large bulbar diverticulum, 1 cm bulbar ulcer with surrounding/ inflammation as described above. Normal D2  . POLYPECTOMY  08/11/2017   Procedure: POLYPECTOMY;  Surgeon: Danie Binder, MD;  [facility];  Service: Endoscopy;;  cecal   . SAVORY DILATION  10/03/2010   Procedure: SAVORY DILATION;  Surgeon: Dorothyann Peng, MD;  [facility];  Service: Endoscopy;;  . TOTAL ABDOMINAL HYSTERECTOMY  1970  . TOTAL KNEE ARTHROPLASTY Left 12/21/2017   Procedure: LEFT TOTAL KNEE ARTHROPLASTY;  Surgeon: Paralee Cancel, MD;  [facility];  Service: Orthopedics;  Laterality: Left;  70 mins block  . UPPER GASTROINTESTINAL ENDOSCOPY  OCT 2004 RMR   DUO TIC, & ULCER   Social History   Socioeconomic History  . Marital status: Single    Spouse name: Not on file  . Number of children: 3   . Years of education: 10  . Highest education level: 10th grade  Occupational History  . Occupation: [employer]  . Occupation: Retired   Scientific laboratory technician  . Financial resource strain: Not very hard  . Food insecurity:    Worry: Never true    Inability: Never true  . Transportation needs:    Medical: No    Non-medical: No  Tobacco Use  . Smoking status: Former Smoker    Types: Cigarettes    Last attempt to quit: 02/20/1985    Years since quitting: 33.1  . Smokeless tobacco: Never Used  Substance and Sexual Activity  . Alcohol use: No  . Drug use: No  . Sexual activity: Not Currently  Lifestyle  . Physical activity:    Days per week: 0 days    Minutes per session: 0 min  . Stress: Only a little  Relationships  . Social connections:    Talks on phone: More than three times a week    Gets together: More than three times a week    Attends religious service: More than 4 times per year    Active member of club or organization: Yes    Attends meetings of clubs or organizations: More than 4 times per year    Relationship status: Never married  Other Topics Concern  . Not on file  Social History Narrative   Lives alone  Outpatient Encounter Medications as of 04/14/2018  Medication Sig  . B-D UF III MINI PEN NEEDLES 31G X 5 MM MISC USE FOR TWICE DAILY INJECTIONS OF HUMALOG  . benazepril (LOTENSIN) 40 MG tablet TAKE 1 TABLET(40 MG) BY MOUTH DAILY (Patient taking differently: Take 40 mg by mouth daily. )  . Calcium Carb-Cholecalciferol (CALCIUM 600/VITAMIN D3 PO) Take 2 tablets by mouth daily.  Marland Kitchen docusate sodium (COLACE) 100 MG capsule Take 1 capsule (100 mg total) by mouth 2 (two) times daily.  . ferrous sulfate (FERROUSUL) 325 (65 FE) MG tablet Take 1 tablet (325 mg total) by mouth 3 (three) times daily with meals.  Marland Kitchen glucose blood (ONE TOUCH ULTRA TEST) test strip USE AS DIRECTED TWICE DAILY dx e11.9  . HUMALOG MIX 75/25 KWIKPEN (75-25) 100 UNIT/ML Kwikpen INJECT 40 UNITS INTO THE  SKIN TWICE DAILY  . hydrochlorothiazide (HYDRODIURIL) 25 MG tablet TAKE 1 TABLET(25 MG) BY MOUTH DAILY  . HYDROcodone-acetaminophen (NORCO) 7.5-325 MG tablet Take 1-2 tablets by mouth every 4 (four) hours as needed for moderate pain.  Marland Kitchen ibuprofen (ADVIL,MOTRIN) 600 MG tablet Take one tablet once daily, as needed, for  Pain. Maximum 3 tablets per week  . Lancets (ONETOUCH DELICA PLUS [id number]) MISC USE TO CHECK GLUCOSE TWICE DAILY AS DIRECTED  . levothyroxine (SYNTHROID, LEVOTHROID) 88 MCG tablet Take 1 tablet (88 mcg total) by mouth daily before breakfast.  . metFORMIN (GLUCOPHAGE) 1000 MG tablet TAKE 1 TABLET(1000 MG) BY MOUTH TWICE DAILY WITH A MEAL  . Multiple Vitamin (MULTIVITAMIN) capsule Take 1 capsule by mouth daily.    Marland Kitchen omeprazole (PRILOSEC) 20 MG capsule TAKE 1 CAPSULE BY MOUTH EVERY MORNING (Patient taking differently: Take 20 mg by mouth daily. )  . pravastatin (PRAVACHOL) 40 MG tablet TAKE 1 TABLET(40 MG) BY MOUTH DAILY  . UNABLE TO FIND Diabetic shoes x 1 pair, Inserts x 3 pair DX E11.9  . [DISCONTINUED] levothyroxine (SYNTHROID, LEVOTHROID) 88 MCG tablet TAKE 1 TABLET BY MOUTH EVERY DAY IN THE MORNING (Patient taking differently: Take 88 mcg by mouth daily before breakfast. )   No facility-administered encounter medications on file as of 04/14/2018.    ALLERGIES: Allergies  Allergen Reactions  . Pravastatin Other (See Comments)    PT is currently taking but states she cramps  . Simvastatin Other (See Comments)    Muscle cramps   VACCINATION STATUS: Immunization History  Administered Date(s) Administered  . Influenza,inj,Quad PF,6+ Mos 11/16/2012, 11/15/2013, 11/14/2014, 11/14/2015, 11/03/2016, 11/09/2017  . Pneumococcal Conjugate-13 10/11/2013  . Pneumococcal Polysaccharide-23 10/07/2011  . Zoster 04/05/2006    Hypertension  Pertinent negatives include no chest pain, headaches, palpitations or shortness of breath.  Diabetes  Pertinent negatives for hypoglycemia  include no confusion, headaches, pallor or seizures. Pertinent negatives for diabetes include no chest pain, no fatigue, no polydipsia, no polyphagia and no polyuria.   73 yr old female with medical hx of type 2 DM, HTN,  RAI induced hypothyroidism, Obesity.  -She is status post radioactive iodine treatment for toxic nodule on October 17, 2010.  She has history of multinodular goiter status post fine-needle aspiration with benign findings. She returns for f/u  of her RAI induced hypothyroidism.  She is currently on levothyroxine 88 mcg p.o. nightly.  She reports compliance to her medication.  She denies any new complaints today.   She denies heat intolerance/cold intolerance. She has a steady weight lately. She also has type 2 diabetes and hypertension.  She would like to follow-up for this with Dr.  Simpson.     Review of Systems  Constitutional: Negative for chills, fatigue, fever and unexpected weight change.  HENT: Negative for trouble swallowing and voice change.   Eyes: Negative for visual disturbance.  Respiratory: Negative for cough, shortness of breath and wheezing.   Cardiovascular: Negative for chest pain, palpitations and leg swelling.  Gastrointestinal: Negative for diarrhea, nausea and vomiting.  Endocrine: Negative for cold intolerance, heat intolerance, polydipsia, polyphagia and polyuria.  Musculoskeletal: Negative for arthralgias and myalgias.  Skin: Negative for color change, pallor, rash and wound.  Neurological: Negative for seizures and headaches.  Psychiatric/Behavioral: Negative for confusion and suicidal ideas.    Objective:    BP (!) 141/81   Pulse (!) 102   Ht '5\' 9"'  (1.753 m)   Wt 209 lb 3.2 oz (94.9 kg)   BMI 30.89 kg/m   Wt Readings from Last 3 Encounters:  04/14/18 209 lb 3.2 oz (94.9 kg)  02/09/18 208 lb (94.3 kg)  02/09/18 208 lb 0.6 oz (94.4 kg)    Physical Exam  Constitutional: She is oriented to person, place, and time. She appears  well-developed.  HENT:  Head: Normocephalic and atraumatic.  Eyes: EOM are normal.  Neck: Normal range of motion. Neck supple. No tracheal deviation present. No thyromegaly present.  Cardiovascular: Normal rate and regular rhythm.  Pulmonary/Chest: Effort normal and breath sounds normal.  Abdominal: Soft. Bowel sounds are normal. There is no abdominal tenderness. There is no guarding.  Musculoskeletal: Normal range of motion.        General: No edema.  Neurological: She is alert and oriented to person, place, and time. She has normal reflexes. No cranial nerve deficit. Coordination normal.  Skin: Skin is warm and dry. No rash noted. No erythema. No pallor.  Psychiatric: She has a normal mood and affect. Judgment normal.    CMP     Component Value Date/Time   NA 138 02/09/2018 1439   K 4.1 02/09/2018 1439   CL 96 02/09/2018 1439   CO2 24 02/09/2018 1439   GLUCOSE 104 (H) 02/09/2018 1439   GLUCOSE 127 (H) 12/23/2017 0502   BUN 15 02/09/2018 1439   CREATININE 1.01 (H) 02/09/2018 1439   CREATININE 0.96 (H) 03/12/2016 0925   CALCIUM 10.1 02/09/2018 1439   PROT 7.8 02/09/2018 1439   ALBUMIN 4.6 02/09/2018 1439   AST 18 02/09/2018 1439   ALT 13 02/09/2018 1439   ALKPHOS 120 (H) 02/09/2018 1439   BILITOT <0.2 02/09/2018 1439   GFRNONAA 56 (L) 02/09/2018 1439   GFRNONAA 60 03/12/2016 0925   GFRAA 64 02/09/2018 1439   GFRAA 69 03/12/2016 0925     Diabetic Labs (most recent): Lab Results  Component Value Date   HGBA1C 6.4 (H) 02/09/2018   HGBA1C 7.3 (H) 11/06/2017   HGBA1C 6.8 (H) 05/27/2017     Lipid Panel ( most recent) Lipid Panel     Component Value Date/Time   CHOL 170 11/06/2017 0947   TRIG 69 11/06/2017 0947   HDL 62 11/06/2017 0947   CHOLHDL 2.7 11/06/2017 0947   CHOLHDL 2.2 01/11/2016 0916   VLDL 11 01/11/2016 0916   LDLCALC 94 11/06/2017 0947   - She has not done thyroid function tests since last visit.   Assessment & Plan:    1. Hypothyroidism,  postradioiodine therapy -Her previsit thyroid function tests are consistent with appropriate replacement.  She is advised to continue levothyroxine 88 mcg p.o. every morning.     - We discussed about the  correct intake of her thyroid hormone, on empty stomach at fasting, with water, separated by at least 30 minutes from breakfast and other medications,  and separated by more than 4 hours from calcium, iron, multivitamins, acid reflux medications (PPIs). -Patient is made aware of the fact that thyroid hormone replacement is needed for life, dose to be adjusted by periodic monitoring of thyroid function tests.    2. Uncontrolled type 2 diabetes mellitus with stage 3 chronic kidney disease, with long-term current use of insulin (HCC) -Her previsit labs show A1c of 6.4%.  -  She insists that she will address that with Dr. Moshe Cipro, to minimize doctor's visits for her.  I advised patient to continue Humalog 75/25  35 units with breakfast and supper when pre-meal blood glucose is above 90 mg/dL. I have Given her detailed carbs and exercise regimen. I advised her to continue metformin 500 mg by mouth twice a day .   - I advised patient to maintain close follow up with Fayrene Helper, MD for diabetes and all other primary care needs. Follow up plan: Return in about 1 year (around 04/15/2019) for Follow up with Pre-visit Labs.  Glade Lloyd, MD Phone: [phone]  Fax: [phone]  -  This note was partially dictated with voice recognition software. Similar sounding words can be transcribed inadequately or may not  be corrected upon review.  04/15/2018, 4:29 PM

## 2018-04-20 ENCOUNTER — Telehealth: Payer: Self-pay

## 2018-04-20 NOTE — Telephone Encounter (Signed)
3rd attempt - [facility]  

## 2018-04-26 ENCOUNTER — Telehealth: Payer: Self-pay

## 2018-04-26 NOTE — Telephone Encounter (Signed)
Several attempts have been made to contact patient without success. Patient will be placed on the inactive list.  If services are needed again.  Please contact [facility] at [phone].    Information will be routed to the PCP and Dr. Hisada  

## 2018-04-28 ENCOUNTER — Other Ambulatory Visit: Payer: Self-pay | Admitting: Family Medicine

## 2018-05-08 ENCOUNTER — Other Ambulatory Visit: Payer: Self-pay | Admitting: Family Medicine

## 2018-05-23 ENCOUNTER — Other Ambulatory Visit: Payer: Self-pay | Admitting: "Endocrinology

## 2018-06-13 ENCOUNTER — Other Ambulatory Visit: Payer: Self-pay | Admitting: Family Medicine

## 2018-06-22 ENCOUNTER — Encounter: Payer: Self-pay | Admitting: Family Medicine

## 2018-06-22 ENCOUNTER — Ambulatory Visit (INDEPENDENT_AMBULATORY_CARE_PROVIDER_SITE_OTHER): Payer: Medicare Other | Admitting: Family Medicine

## 2018-06-22 ENCOUNTER — Other Ambulatory Visit: Payer: Self-pay

## 2018-06-22 DIAGNOSIS — E119 Type 2 diabetes mellitus without complications: Secondary | ICD-10-CM

## 2018-06-22 DIAGNOSIS — Z794 Long term (current) use of insulin: Secondary | ICD-10-CM | POA: Diagnosis not present

## 2018-06-22 DIAGNOSIS — E669 Obesity, unspecified: Secondary | ICD-10-CM

## 2018-06-22 DIAGNOSIS — IMO0001 Reserved for inherently not codable concepts without codable children: Secondary | ICD-10-CM

## 2018-06-22 NOTE — Progress Notes (Signed)
Virtual Visit via Telephone Note  I connected with Stacey Hanson on 06/22/18 at 10:40 AM EDT by telephone and verified that I am speaking with the correct person using two identifiers.   I discussed the limitations, risks, security and privacy concerns of performing an evaluation and management service by telephone and the availability of in person appointments. I also discussed with the patient that there may be a patient responsible charge related to this service. The patient expressed understanding and agreed to proceed.  Location of Patient: Home Location of Provider: Telehealth Consent was obtain for visit to be over via telehealth.  History of Present Illness: Stacey Hanson is a 73 year old female patient of Dr. Moshe Cipro.  Who presents for her 22-month follow-up.  She has not gotten her lab work for review of this appointment.  Secondary to the pandemic.  She is a little bit nervous and uneasy about going out to get her lab work at this time.  Overall today she is without complaints.  She reports that she is taking her medication without trouble.  Does not have any complaints or and/or side effects from these.  Denies having any trouble with her blood sugar.  Denies having any headaches, vision changes, polydipsia, polyuria, polyphagia.  December A1c was 6.4.  Will look to review this with her next lab draw.  She is encouraged to get her labs done over the next 2 months.  Reports that she is staying hydrated and eating well.  But she has not been actually working out or doing anything like that because of the pandemic.  [facility] is unable [facility] due to the virus.   Past Medical, Surgical, Social History, Allergies, and Medications have been Reviewed.  Review of Systems  Constitutional: Negative.  Negative for chills and fever.  HENT: Negative.   Eyes: Negative.   Respiratory: Negative.  Negative for cough, shortness of breath and wheezing.    Cardiovascular: Negative.  Negative for chest pain, palpitations and leg swelling.  Gastrointestinal: Negative.   Genitourinary: Negative.   Musculoskeletal: Negative.   Skin: Negative.   Neurological: Negative.   Endo/Heme/Allergies: Negative.  Negative for polydipsia.  Psychiatric/Behavioral: Negative.     Observations/Objective: Good communication on phone visit.  Assessment and Plan: 1. Obesity (BMI 30.0-34.9) Strongly encouraged to try to walk.  Even if she is walking around her area of where she lives.  Encouraged to get back into [employer] once she is able to once the [facility] can safely open. Encouraged to make sure that she stay safe during this time.  But that does not mean that she should not make sure that she is eating healthy and trying to get some form of exercise, walking in.  2. Diabetes mellitus, insulin dependent (IDDM), controlled ([place]) Reports that she did not get her labs drawn because she is worried about the pandemic and being exposed to the virus.  Educated her that she can go into the lab sign in and then go to her car and wait to be brought in and wear a mask.  But she is a little bit hesitant of this.  She is strongly encouraged to try to get her labs over the next 2 months.  As this will make about a 25-month mark from having her last labs drawn.  Advised for her to have a 1-month appointment follow-up so that we can make sure her labs are doing okay.  And make sure if there  are any medication adjustments that need to be done we can do them at that time.  Patient acknowledged agreement and understanding of the plan.    Follow Up Instructions: AVS printed and mailed   I discussed the assessment and treatment plan with the patient. The patient was provided an opportunity to ask questions and all were answered. The patient agreed with the plan and demonstrated an understanding of the instructions.   The patient was advised to call back or seek an in-person evaluation if  the symptoms worsen or if the condition fails to improve as anticipated.  I provided 10 minutes of non-face-to-face time during this encounter.   Perlie Mayo, NP

## 2018-06-22 NOTE — Patient Instructions (Addendum)
    Thank you for completing your appt today via telephone. I appreciate the opportunity to provide you with the care for your health and wellness. Today we discussed:  Labs and health  We are glad that you feel like you are doing well overall.  Please get your labs within the next 2 months.  Your next appointment will be between 7 to 8 weeks out from now.  You can wait until your next appointment 1 week prior to get your labs if that is what you would like to do.  To give more time for the virus to settle down.  That is okay.  But please make sure that she get the labs prior to the next appointment with Dr. Moshe Cipro.  Make sure that you are getting some form of exercise as you can and in a safe manner.  Sure that you are following a healthy diet and not overeating during this time of being at home more.   [place] YOUR HANDS WELL AND FREQUENTLY. AVOID TOUCHING YOUR FACE, UNLESS YOUR HANDS ARE FRESHLY WASHED.  GET FRESH AIR DAILY. STAY HYDRATED WITH WATER.   It was a pleasure to see you and I look forward to continuing to work together on your health and well-being. Please do not hesitate to call the office if you need care or have questions about your care.  Have a wonderful day and week. With Gratitude, Cherly Beach, DNP, AGNP-BC

## 2018-06-26 ENCOUNTER — Other Ambulatory Visit: Payer: Self-pay | Admitting: Family Medicine

## 2018-07-05 ENCOUNTER — Encounter: Payer: Self-pay | Admitting: Gastroenterology

## 2018-08-01 ENCOUNTER — Other Ambulatory Visit (HOSPITAL_COMMUNITY): Payer: Self-pay | Admitting: Family Medicine

## 2018-08-01 DIAGNOSIS — Z1231 Encounter for screening mammogram for malignant neoplasm of breast: Secondary | ICD-10-CM

## 2018-08-08 ENCOUNTER — Telehealth: Payer: Self-pay

## 2018-08-08 ENCOUNTER — Telehealth: Payer: Self-pay | Admitting: Family Medicine

## 2018-08-08 ENCOUNTER — Other Ambulatory Visit: Payer: Self-pay | Admitting: Family Medicine

## 2018-08-08 DIAGNOSIS — I10 Essential (primary) hypertension: Secondary | ICD-10-CM

## 2018-08-08 DIAGNOSIS — E119 Type 2 diabetes mellitus without complications: Secondary | ICD-10-CM | POA: Diagnosis not present

## 2018-08-08 DIAGNOSIS — E1159 Type 2 diabetes mellitus with other circulatory complications: Secondary | ICD-10-CM

## 2018-08-08 DIAGNOSIS — E785 Hyperlipidemia, unspecified: Secondary | ICD-10-CM | POA: Diagnosis not present

## 2018-08-08 DIAGNOSIS — E782 Mixed hyperlipidemia: Secondary | ICD-10-CM

## 2018-08-08 DIAGNOSIS — D649 Anemia, unspecified: Secondary | ICD-10-CM | POA: Diagnosis not present

## 2018-08-08 DIAGNOSIS — Z794 Long term (current) use of insulin: Secondary | ICD-10-CM | POA: Diagnosis not present

## 2018-08-08 NOTE — Telephone Encounter (Signed)
Pls enter these labs for [facility]  cBC, fasting lipid, cmp and eGFr, HBA1Cand microalb, haf d them/ is having today, dx IDDM, HTN, hyperlipid and anemia

## 2018-08-08 NOTE — Telephone Encounter (Signed)
Please update labs  For appt on 6/10.  Pt called this am.  I called her back and told her she need to go today.

## 2018-08-08 NOTE — Addendum Note (Signed)
Addended by: Eual Fines on: 08/08/2018 02:13 PM   Modules accepted: Orders

## 2018-08-08 NOTE — Telephone Encounter (Signed)
Labs ordered.

## 2018-08-08 NOTE — Telephone Encounter (Signed)
Labs entered to be drawn prior to appt

## 2018-08-08 NOTE — Telephone Encounter (Signed)
Labs entered.

## 2018-08-10 ENCOUNTER — Ambulatory Visit (INDEPENDENT_AMBULATORY_CARE_PROVIDER_SITE_OTHER): Payer: Medicare Other | Admitting: Family Medicine

## 2018-08-10 ENCOUNTER — Other Ambulatory Visit: Payer: Self-pay

## 2018-08-10 ENCOUNTER — Encounter: Payer: Self-pay | Admitting: Family Medicine

## 2018-08-10 ENCOUNTER — Encounter (INDEPENDENT_AMBULATORY_CARE_PROVIDER_SITE_OTHER): Payer: Self-pay

## 2018-08-10 VITALS — BP 130/70 | HR 84 | Temp 97.0°F | Resp 15 | Ht 69.0 in | Wt 213.0 lb

## 2018-08-10 DIAGNOSIS — E1159 Type 2 diabetes mellitus with other circulatory complications: Secondary | ICD-10-CM

## 2018-08-10 DIAGNOSIS — I10 Essential (primary) hypertension: Secondary | ICD-10-CM | POA: Diagnosis not present

## 2018-08-10 DIAGNOSIS — E890 Postprocedural hypothyroidism: Secondary | ICD-10-CM | POA: Diagnosis not present

## 2018-08-10 DIAGNOSIS — E6609 Other obesity due to excess calories: Secondary | ICD-10-CM

## 2018-08-10 DIAGNOSIS — Z6831 Body mass index (BMI) 31.0-31.9, adult: Secondary | ICD-10-CM

## 2018-08-10 LAB — MICROALBUMIN / CREATININE URINE RATIO
Creatinine, Urine: 150.5 mg/dL
Microalb/Creat Ratio: 21 mg/g creat (ref 0–29)
Microalbumin, Urine: 31.8 ug/mL

## 2018-08-10 LAB — CBC/DIFF AMBIGUOUS DEFAULT
Basophils Absolute: 0 10*3/uL (ref 0.0–0.2)
Basos: 1 %
EOS (ABSOLUTE): 0 10*3/uL (ref 0.0–0.4)
Eos: 0 %
Hematocrit: 37.5 % (ref 34.0–46.6)
Hemoglobin: 13 g/dL (ref 11.1–15.9)
Immature Grans (Abs): 0 10*3/uL (ref 0.0–0.1)
Immature Granulocytes: 0 %
Lymphocytes Absolute: 2 10*3/uL (ref 0.7–3.1)
Lymphs: 29 %
MCH: 31.3 pg (ref 26.6–33.0)
MCHC: 34.7 g/dL (ref 31.5–35.7)
MCV: 90 fL (ref 79–97)
Monocytes Absolute: 0.5 10*3/uL (ref 0.1–0.9)
Monocytes: 7 %
Neutrophils Absolute: 4.4 10*3/uL (ref 1.4–7.0)
Neutrophils: 63 %
Platelets: 451 10*3/uL — ABNORMAL HIGH (ref 150–450)
RBC: 4.16 x10E6/uL (ref 3.77–5.28)
RDW: 12.5 % (ref 11.7–15.4)
WBC: 7 10*3/uL (ref 3.4–10.8)

## 2018-08-10 LAB — COMPREHENSIVE METABOLIC PANEL
ALT: 16 IU/L (ref 0–32)
AST: 19 IU/L (ref 0–40)
Albumin/Globulin Ratio: 1.5 (ref 1.2–2.2)
Albumin: 4.6 g/dL (ref 3.7–4.7)
Alkaline Phosphatase: 123 IU/L — ABNORMAL HIGH (ref 39–117)
BUN/Creatinine Ratio: 14 (ref 12–28)
BUN: 15 mg/dL (ref 8–27)
Bilirubin Total: 0.3 mg/dL (ref 0.0–1.2)
CO2: 22 mmol/L (ref 20–29)
Calcium: 9.9 mg/dL (ref 8.7–10.3)
Chloride: 98 mmol/L (ref 96–106)
Creatinine, Ser: 1.09 mg/dL — ABNORMAL HIGH (ref 0.57–1.00)
GFR calc Af Amer: 59 mL/min/{1.73_m2} — ABNORMAL LOW (ref >59)
GFR calc non Af Amer: 51 mL/min/{1.73_m2} — ABNORMAL LOW (ref >59)
Globulin, Total: 3 g/dL (ref 1.5–4.5)
Glucose: 195 mg/dL — ABNORMAL HIGH (ref 65–99)
Potassium: 4.5 mmol/L (ref 3.5–5.2)
Sodium: 138 mmol/L (ref 134–144)
Total Protein: 7.6 g/dL (ref 6.0–8.5)

## 2018-08-10 LAB — SPECIMEN STATUS REPORT

## 2018-08-10 LAB — LIPID PANEL W/O CHOL/HDL RATIO
Cholesterol, Total: 171 mg/dL (ref 100–199)
HDL: 64 mg/dL (ref >39)
LDL Calculated: 89 mg/dL (ref 0–99)
Triglycerides: 88 mg/dL (ref 0–149)
VLDL Cholesterol Cal: 18 mg/dL (ref 5–40)

## 2018-08-10 LAB — HGB A1C W/O EAG: Hgb A1c MFr Bld: 8.5 % — ABNORMAL HIGH (ref 4.8–5.6)

## 2018-08-10 NOTE — Patient Instructions (Signed)
Annual Physical exam 3 week in September, call if you need me before  Non fasting HBA1c, chem 7 and EGFR Sept 10 or shortly after   Please stop the unhealthy snacks, as your blood sugar now uncontrolled because of this, increase vegetables and fruit as snacks  Avoid beans and regular milk and all foods that you note may cause some discomfort, I believe this is mainly due to sugar  It is important that you exercise regularly at least 30 minutes 5 times a week. If you develop chest pain, have severe difficulty breathing, or feel very tired, stop exercising immediately and seek medical attention   Social distancing. Frequent hand washing with soap and water Keeping your hands off of your face. These 3 practices will help to keep both you and your community healthy during this time. Please practice them faithfully!   Thanks for choosing [facility], we consider it a privelige to serve you.

## 2018-08-11 ENCOUNTER — Other Ambulatory Visit: Payer: Self-pay

## 2018-08-11 ENCOUNTER — Ambulatory Visit (HOSPITAL_COMMUNITY)
Admission: RE | Admit: 2018-08-11 | Discharge: 2018-08-11 | Disposition: A | Discharge status: Home / Self Care | Payer: Medicare Other | Source: Ambulatory Visit | Attending: Family Medicine | Admitting: Family Medicine

## 2018-08-11 DIAGNOSIS — Z1231 Encounter for screening mammogram for malignant neoplasm of breast: Secondary | ICD-10-CM | POA: Diagnosis not present

## 2018-08-11 NOTE — Patient Outreach (Signed)
[place] [facility]) Care Management  08/11/2018  Stacey Hanson 08/13/1945 [id number]   Medication Adherence call to [name] Compliant Voice message left with a call back number. [name] is showing past due on Benazepril 40 mg under [place].  [place] Management Direct Dial [phone]  Fax [phone] [email or web address]

## 2018-08-13 ENCOUNTER — Encounter: Payer: Self-pay | Admitting: Family Medicine

## 2018-08-13 NOTE — Assessment & Plan Note (Signed)
Deteriorated Stacey Hanson is reminded of the importance of commitment to daily physical activity for 30 minutes or more, as able and the need to limit carbohydrate intake to 30 to 60 grams per meal to help with blood sugar control.   The need to take medication as prescribed, test blood sugar as directed, and to call between visits if there is a concern that blood sugar is uncontrolled is also discussed.   Stacey Hanson is reminded of the importance of daily foot exam, annual eye examination, and good blood sugar, blood pressure and cholesterol control.  Diabetic Labs Latest Ref Rng & Units 08/08/2018 02/09/2018 12/23/2017 12/22/2017 12/13/2017  HbA1c 4.8 - 5.6 % 8.5(H) 6.4(H) - - -  Microalbumin Not estab mg/dL - - - - -  Micro/Creat Ratio <30 mcg/mg creat - - - - -  Chol 100 - 199 mg/dL 171 - - - -  HDL >39 mg/dL 64 - - - -  Calc LDL 0 - 99 mg/dL 89 - - - -  Triglycerides 0 - 149 mg/dL 88 - - - -  Creatinine 0.57 - 1.00 mg/dL 1.09(H) 1.01(H) 1.07(H) 1.06(H) 0.96   BP/Weight 08/10/2018 04/14/2018 02/09/2018 02/09/2018 12/24/2017 12/13/2017 68/03/1570  Systolic BP 620 355 974 163 845 364 680  Diastolic BP 70 81 72 80 74 82 64  Wt. (Lbs) 213 209.2 208 208.04 - 205 211  BMI 31.45 30.89 30.72 30.72 - 30.27 31.16   Foot/eye exam completion dates Latest Ref Rng & Units 11/09/2017 10/04/2017  Eye Exam No Retinopathy - No Retinopathy  Foot exam Order - - -  Foot Form Completion - Done -

## 2018-08-13 NOTE — Assessment & Plan Note (Signed)
Controlled, no change in medication DASH diet and commitment to daily physical activity for a minimum of 30 minutes discussed and encouraged, as a part of hypertension management. The importance of attaining a healthy weight is also discussed.  BP/Weight 08/10/2018 04/14/2018 02/09/2018 02/09/2018 12/24/2017 12/13/2017 14/09/4037  Systolic BP 795 369 223 009 794 997 182  Diastolic BP 70 81 72 80 74 82 64  Wt. (Lbs) 213 209.2 208 208.04 - 205 211  BMI 31.45 30.89 30.72 30.72 - 30.27 31.16

## 2018-08-13 NOTE — Assessment & Plan Note (Signed)
  Patient re-educated about  the importance of commitment to a  minimum of 150 minutes of exercise per week as able.  The importance of healthy food choices with portion control discussed, as well as eating regularly and within a 12 hour window most days. The need to choose "clean , green" food 50 to 75% of the time is discussed, as well as to make water the primary drink and set a goal of 64 ounces water daily.    Weight /BMI 08/10/2018 04/14/2018 02/09/2018  WEIGHT 213 lb 209 lb 3.2 oz 208 lb  HEIGHT 5\' 9"  5\' 9"  5\' 9"   BMI 31.45 kg/m2 30.89 kg/m2 30.72 kg/m2

## 2018-08-13 NOTE — Progress Notes (Signed)
Stacey Hanson     MRN: [id number]      DOB: August 07, 1945   HPI Stacey Hanson is here for follow up and re-evaluation of chronic medical conditions, medication management and review of any available recent lab and radiology data.  Preventive health is updated, specifically  Cancer screening and Immunization.   Questions or concerns regarding consultations or procedures which the PT has had in the interim are  addressed. The PT denies any adverse reactions to current medications since the last visit.  Has not been as consistent with healthy food choice and has been unable to exercise as in the past , due to gym closure which she misses, states blood sugar has varied a lot based on diet, and she is aware not as well controlled as in the past , but will address this Happy with knee replacement Denies polyuria, polydipsia, blurred vision , or hypoglycemic episodes.   ROS Denies recent fever or chills. Denies sinus pressure, nasal congestion, ear pain or sore throat. Denies chest congestion, productive cough or wheezing. Denies chest pains, palpitations and leg swelling C/o intermittent upper abdominal pain,esp aggravated by mil, beans, denies  nausea, vomiting,diarrhea or constipation.   Denies dysuria, frequency, hesitancy or incontinence. Denies uncontrolled joint pain, swelling and limitation in mobility. Denies headaches, seizures, numbness, or tingling. Denies depression, anxiety or insomnia. Denies skin break down or rash.   PE  BP 130/70   Pulse 84   Temp (!) 97 F (36.1 C) (Temporal)   Resp 15   Ht 5\' 9"  (1.753 m)   Wt 213 lb (96.6 kg)   SpO2 97%   BMI 31.45 kg/m   Patient alert and oriented and in no cardiopulmonary distress.  HEENT: No facial asymmetry, EOMI,   oropharynx pink and moist.  Neck supple no JVD, no mass.  Chest: Clear to auscultation bilaterally.  CVS: S1, S2 no murmurs, no S3.Regular rate.  ABD: Soft non tender.   Ext: No edema  MS: Adequate though  reduced  ROM spine, shoulders, hips and knees.  Skin: Intact, no ulcerations or rash noted.  Psych: Good eye contact, normal affect. Memory intact not anxious or depressed appearing.  CNS: CN 2-12 intact, power,  normal throughout.no focal deficits noted.   Assessment & Plan  ESSENTIAL HYPERTENSION, BENIGN Controlled, no change in medication DASH diet and commitment to daily physical activity for a minimum of 30 minutes discussed and encouraged, as a part of hypertension management. The importance of attaining a healthy weight is also discussed.  BP/Weight 08/10/2018 04/14/2018 02/09/2018 02/09/2018 12/24/2017 12/13/2017 11/02/8180  Systolic BP 993 716 967 893 810 175 102  Diastolic BP 70 81 72 80 74 82 64  Wt. (Lbs) 213 209.2 208 208.04 - 205 211  BMI 31.45 30.89 30.72 30.72 - 30.27 31.16       Type 2 diabetes mellitus with vascular disease ([place]) Deteriorated Stacey Hanson is reminded of the importance of commitment to daily physical activity for 30 minutes or more, as able and the need to limit carbohydrate intake to 30 to 60 grams per meal to help with blood sugar control.   The need to take medication as prescribed, test blood sugar as directed, and to call between visits if there is a concern that blood sugar is uncontrolled is also discussed.   Stacey Hanson is reminded of the importance of daily foot exam, annual eye examination, and good blood sugar, blood pressure and cholesterol control.  Diabetic Labs Latest Ref Rng & Units  08/08/2018 02/09/2018 12/23/2017 12/22/2017 12/13/2017  HbA1c 4.8 - 5.6 % 8.5(H) 6.4(H) - - -  Microalbumin Not estab mg/dL - - - - -  Micro/Creat Ratio <30 mcg/mg creat - - - - -  Chol 100 - 199 mg/dL 171 - - - -  HDL >39 mg/dL 64 - - - -  Calc LDL 0 - 99 mg/dL 89 - - - -  Triglycerides 0 - 149 mg/dL 88 - - - -  Creatinine 0.57 - 1.00 mg/dL 1.09(H) 1.01(H) 1.07(H) 1.06(H) 0.96   BP/Weight 08/10/2018 04/14/2018 02/09/2018 02/09/2018 12/24/2017  12/13/2017 99/09/3380  Systolic BP 505 397 673 419 379 024 097  Diastolic BP 70 81 72 80 74 82 64  Wt. (Lbs) 213 209.2 208 208.04 - 205 211  BMI 31.45 30.89 30.72 30.72 - 30.27 31.16   Foot/eye exam completion dates Latest Ref Rng & Units 11/09/2017 10/04/2017  Eye Exam No Retinopathy - No Retinopathy  Foot exam Order - - -  Foot Form Completion - Done -        Obesity  Patient re-educated about  the importance of commitment to a  minimum of 150 minutes of exercise per week as able.  The importance of healthy food choices with portion control discussed, as well as eating regularly and within a 12 hour window most days. The need to choose "clean , green" food 50 to 75% of the time is discussed, as well as to make water the primary drink and set a goal of 64 ounces water daily.    Weight /BMI 08/10/2018 04/14/2018 02/09/2018  WEIGHT 213 lb 209 lb 3.2 oz 208 lb  HEIGHT 5\' 9"  5\' 9"  5\' 9"   BMI 31.45 kg/m2 30.89 kg/m2 30.72 kg/m2      Hypothyroidism, postradioiodine therapy Managed by Endo and controlled

## 2018-08-13 NOTE — Assessment & Plan Note (Signed)
Managed by Endo and controlled 

## 2018-08-16 ENCOUNTER — Telehealth: Payer: Self-pay | Admitting: Family Medicine

## 2018-08-16 ENCOUNTER — Other Ambulatory Visit: Payer: Self-pay | Admitting: Family Medicine

## 2018-08-16 NOTE — Telephone Encounter (Signed)
Spoke with patient and let her know Dr.Simpsons treatment plan with verbal understanding. Patient isn't happy but agreed to treatment plan.

## 2018-08-16 NOTE — Telephone Encounter (Signed)
Spoke with patient and when I asked her why she wanted the metformin changed from 1000mg  to 500mg  she stated she feels the 500mg  is better for her. She is having crampy feelings along her sides and in her stomach and some diarrhea but mostly crampy feelings. She just feels the 500mg  works better for her. Let her know I would send a message to the provider and would be back in touch once I received a response back.

## 2018-08-16 NOTE — Telephone Encounter (Signed)
Pls remind her that the cramping may be linked to too much candy and starchy foods so she needs to reduce this as her blood sugar got too high and was recently uncontrolled I recommend sh e split the 1000 mg tbs in half and take half tab FOUR times daily, really  Hesitant to reduce dose of medication when blood sugar is uncontrolled

## 2018-08-16 NOTE — Telephone Encounter (Signed)
Wants the metformin changed to 500mg  not 1000mg  --please call

## 2018-08-17 ENCOUNTER — Other Ambulatory Visit: Payer: Self-pay | Admitting: Gastroenterology

## 2018-08-18 ENCOUNTER — Other Ambulatory Visit: Payer: Self-pay | Admitting: "Endocrinology

## 2018-09-16 ENCOUNTER — Other Ambulatory Visit: Payer: Self-pay | Admitting: Family Medicine

## 2018-09-21 ENCOUNTER — Other Ambulatory Visit: Payer: Self-pay

## 2018-09-21 NOTE — Patient Outreach (Signed)
[place] [facility]) Care Management  09/21/2018  Kandace Elrod 06-May-1945 [id number]   Medication Adherence call to Mrs. Mechele Claude Riedesel [place] Identifiers Verify spoke with patient she is past due on Benazepril patient explain she is taking 1 tablet daily and has about six more tablets patient ask if we can call [facility] an order [facility]. Mrs. Menta is showing past due under [place].   [place] Management Direct Dial [phone]  Fax [phone] [email or web address]

## 2018-09-26 ENCOUNTER — Other Ambulatory Visit: Payer: Self-pay | Admitting: Family Medicine

## 2018-11-09 ENCOUNTER — Other Ambulatory Visit: Payer: Self-pay | Admitting: Family Medicine

## 2018-11-09 DIAGNOSIS — E1159 Type 2 diabetes mellitus with other circulatory complications: Secondary | ICD-10-CM | POA: Diagnosis not present

## 2018-11-10 LAB — BASIC METABOLIC PANEL
BUN/Creatinine Ratio: 16 (ref 12–28)
BUN: 16 mg/dL (ref 8–27)
CO2: 22 mmol/L (ref 20–29)
Calcium: 9.7 mg/dL (ref 8.7–10.3)
Chloride: 101 mmol/L (ref 96–106)
Creatinine, Ser: 1.03 mg/dL — ABNORMAL HIGH (ref 0.57–1.00)
GFR calc Af Amer: 62 mL/min/{1.73_m2} (ref >59)
GFR calc non Af Amer: 54 mL/min/{1.73_m2} — ABNORMAL LOW (ref >59)
Glucose: 133 mg/dL — ABNORMAL HIGH (ref 65–99)
Potassium: 4.4 mmol/L (ref 3.5–5.2)
Sodium: 142 mmol/L (ref 134–144)

## 2018-11-10 LAB — SPECIMEN STATUS REPORT

## 2018-11-10 LAB — HGB A1C W/O EAG: Hgb A1c MFr Bld: 7.7 % — ABNORMAL HIGH (ref 4.8–5.6)

## 2018-11-23 ENCOUNTER — Other Ambulatory Visit: Payer: Self-pay

## 2018-11-23 ENCOUNTER — Encounter: Payer: Self-pay | Admitting: Family Medicine

## 2018-11-23 ENCOUNTER — Ambulatory Visit (INDEPENDENT_AMBULATORY_CARE_PROVIDER_SITE_OTHER): Payer: Medicare Other | Admitting: Family Medicine

## 2018-11-23 VITALS — BP 130/70 | HR 72 | Resp 14 | Ht 69.0 in | Wt 216.0 lb

## 2018-11-23 DIAGNOSIS — Z0000 Encounter for general adult medical examination without abnormal findings: Secondary | ICD-10-CM | POA: Diagnosis not present

## 2018-11-23 DIAGNOSIS — Z23 Encounter for immunization: Secondary | ICD-10-CM | POA: Diagnosis not present

## 2018-11-23 DIAGNOSIS — E782 Mixed hyperlipidemia: Secondary | ICD-10-CM

## 2018-11-23 DIAGNOSIS — E1159 Type 2 diabetes mellitus with other circulatory complications: Secondary | ICD-10-CM

## 2018-11-23 NOTE — Assessment & Plan Note (Signed)

## 2018-11-23 NOTE — Assessment & Plan Note (Signed)
After obtaining informed consent, the vaccine is  administered , with no adverse effect noted at the time of administration.  

## 2018-11-23 NOTE — Assessment & Plan Note (Signed)
Improved and well controlled, no med change

## 2018-11-23 NOTE — Patient Instructions (Addendum)
Wellness visit as before.  Follow-up with MD end of February call if you need me sooner.  Excellent lab work and exam congratulations and keep up the great work.  Flu vaccine today Foot exam today qualifies you for diabetic shoes and paperwork will be sent for you today so that you can  to obtain these when due in December.  Fasting lipid CMP and EGFR and hemoglobin A1c 1 week before February follow-up visit    It is important that you exercise regularly at least 30 minutes 5 times a week. If you develop chest pain, have severe difficulty breathing, or feel very tired, stop exercising immediately and seek medical attention   Think about what you will eat, plan ahead. Choose " clean, green, fresh or frozen" over canned, processed or packaged foods which are more sugary, salty and fatty. 70 to 75% of food eaten should be vegetables and fruit. Three meals at set times with snacks allowed between meals, but they must be fruit or vegetables. Aim to eat over a 12 hour period , example 7 am to 7 pm, and STOP after  your last meal of the day. Drink water,generally about 64 ounces per day, no other drink is as healthy. Fruit juice is best enjoyed in a healthy way, by EATING the fruit. Thanks for choosing [facility], we consider it a privelige to serve you.

## 2018-11-23 NOTE — Progress Notes (Signed)
    Stacey Hanson     MRN: [id number]      DOB: [birth year]  HPI: Patient is in for annual physical exam. No other health concerns are expressed or addressed at the visit. Recent labs, if available are reviewed. Immunization is reviewed , and  updated if needed.   PE: BP 130/70   Pulse 72   Resp 14   Ht 5\' 9"  (1.753 m)   Wt 216 lb 0.6 oz (98 kg)   SpO2 98%   BMI 31.90 kg/m    Pleasant  female, alert and oriented x 3, in no cardio-pulmonary distress. Afebrile. HEENT No facial trauma or asymetry. Sinuses non tender.  Extra occullar muscles intact,  External ears normal, Neck: supple, no adenopathy,JVD or thyromegaly.No bruits.  Chest: Clear to ascultation bilaterally.No crackles or wheezes. Non tender to palpation  Breast: Normal mammogram 08/2018,  Not examined  Cardiovascular system; Heart sounds normal,  S1 and  S2 ,no S3.  No murmur, or thrill. Apical beat not displaced Peripheral pulses normal.  Abdomen: Soft, non tender, no organomegaly or masses. l. No guarding, tenderness or rebound.   Musculoskeletal exam: Decreased  ROM of spine, hips ,  and knees. deformity ,swelling and  crepitus noted in knes No muscle wasting or atrophy.   Neurologic: Cranial nerves 2 to 12 intact. Power, tone ,sensation  normal throughout. No disturbance in gait. No tremor.  Skin: Intact, no ulceration, erythema , scaling or rash noted. Pigmentation normal throughout  Psych; Normal mood and affect. Judgement and concentration normal   Assessment & Plan:  Type 2 diabetes mellitus with vascular disease ([place]) Improved and well controlled, no med change  Annual physical exam Annual exam as documented. Counseling done  re healthy lifestyle involving commitment to 150 minutes exercise per week, heart healthy diet, and attaining healthy weight.The importance of adequate sleep also discussed. Regular seat belt use and home safety, is also discussed. Changes in health habits  are decided on by the patient with goals and time frames  set for achieving them. Immunization and cancer screening needs are specifically addressed at this visit.   Need for influenza vaccination After obtaining informed consent, the vaccine is  administered , with no adverse effect noted at the time of administration.

## 2018-11-29 ENCOUNTER — Other Ambulatory Visit: Payer: Self-pay | Admitting: Family Medicine

## 2018-12-08 DIAGNOSIS — E119 Type 2 diabetes mellitus without complications: Secondary | ICD-10-CM | POA: Diagnosis not present

## 2018-12-09 ENCOUNTER — Other Ambulatory Visit: Payer: Self-pay | Admitting: Family Medicine

## 2018-12-15 ENCOUNTER — Other Ambulatory Visit: Payer: Self-pay

## 2018-12-15 DIAGNOSIS — Z96652 Presence of left artificial knee joint: Secondary | ICD-10-CM | POA: Diagnosis not present

## 2018-12-15 DIAGNOSIS — Z471 Aftercare following joint replacement surgery: Secondary | ICD-10-CM | POA: Diagnosis not present

## 2018-12-15 MED ORDER — BENAZEPRIL HCL 40 MG PO TABS: ORAL_TABLET | ORAL | 3 refills | Status: DC

## 2018-12-23 ENCOUNTER — Other Ambulatory Visit: Payer: Self-pay

## 2018-12-23 MED ORDER — INSULIN LISPRO PROT & LISPRO (75-25 MIX) 100 UNIT/ML KWIKPEN: PEN_INJECTOR | SUBCUTANEOUS | 4 refills | Status: DC

## 2018-12-26 ENCOUNTER — Telehealth: Payer: Self-pay | Admitting: *Deleted

## 2018-12-26 NOTE — Telephone Encounter (Signed)
Pt called needing a prescription sent in for her sugar meter it is the one touch ultra and this [facility] be [facility]

## 2018-12-27 ENCOUNTER — Other Ambulatory Visit: Payer: Self-pay

## 2018-12-27 MED ORDER — ONETOUCH ULTRA 2 W/DEVICE KIT: PACK | 0 refills | Status: DC

## 2018-12-27 NOTE — Telephone Encounter (Signed)
Sent to [facility]

## 2019-01-18 ENCOUNTER — Encounter: Payer: Self-pay | Admitting: Gastroenterology

## 2019-01-18 ENCOUNTER — Ambulatory Visit: Payer: Medicare Other | Admitting: Gastroenterology

## 2019-01-18 ENCOUNTER — Other Ambulatory Visit: Payer: Self-pay

## 2019-01-18 DIAGNOSIS — K219 Gastro-esophageal reflux disease without esophagitis: Secondary | ICD-10-CM

## 2019-01-18 DIAGNOSIS — Z6830 Body mass index (BMI) 30.0-30.9, adult: Secondary | ICD-10-CM | POA: Insufficient documentation

## 2019-01-18 NOTE — Progress Notes (Signed)
CC'ED TO PCP 

## 2019-01-18 NOTE — Patient Instructions (Addendum)
EAT TO LIVE AND THINK OF FOOD AS MEDICINE. 75% OF YOUR PLATE SHOULD BE FRUITS/VEGGIES.  To have more energy, better control your diabetes, and to lose weight:      1. CONTINUE YOUR WEIGHT LOSS EFFORTS. I RECOMMEND YOU READ AND FOLLOW RECOMMENDATIONS BY DR. MARK HYMAN, "10-DAY DETOX DIET".    2. If you must eat bread, EAT EZEKIEL BREAD. IT IS IN THE FROZEN SECTION OF THE GROCERY STORE.    3. DRINK WATER WITH FRUIT OR CUCUMBER ADDED. YOUR URINE SHOULD BE LIGHT YELLOW. AVOID SODA, GATORADE, ENERGY DRINKS, OR DIET SODA.     4. AVOID HIGH FRUCTOSE CORN SYRUP AND CAFFEINE.     5. DO NOT chew SUGAR FREE GUM OR USE ARTIFICIAL SWEETENERS. IF NEEDED USE STEVIA AS A SWEETENER.    6. DO NOT EAT ENRICHED WHEAT FLOUR, PASTA, RICE, OR CEREAL.    7. ONLY EAT WILD CAUGHT SEAFOOD, GRASS FED BEEF OR CHICKEN, PORK FROM PASTURE RAISE PIGS, OR EGGS FROM PASTURE RAISED CHICKENS.    8. PRACTICE CHAIR YOGA FOR 15-30 MINS 3 OR 4 TIMES A WEEK for 3 mos. Call when you ARE READY TO DO MOE ADVANCED EXERCISES.     9. START TAKING VITAMIN B12, AND VITAMIN D3 2000 IU DAILY.   ADDITIONAL SUPPLEMENTS TO DECREASE CRAVING, BURN FAT, CONTROL BLOOD SUGARS, AND SUPPRESS YOUR APPETITE:    1. CINNAMON 500 MG EVERY AM PRIOR TO FIRST MEAL.   **STABILIZES BLOOD GLUCOSE/REDUCES CRAVINGS**    2. CHROMIUM 400-500 MG WITH MEALS TWICE DAILY.    **FAT BURNER**    3. GREEN TEA EXTRACT ONE DAILY.   **FAT BURNER/SUPPRESSES YOUR APPETITE**    4. ALPHA LIPOIC ACID TWICE DAILY.   **NATURAL ANTI-INFLAMMATORY SUPPLEMENT THAT IS AN ALTERNATIVE TO IBUPROFEN OR NAPROXEN**   FOLLOW UP IN 6-12 MOS.

## 2019-01-18 NOTE — Assessment & Plan Note (Addendum)
WEIGHT UP TO 212 LBS.  DISCUSSED BENEFITS OF WEIGHT LOSS. EAT TO LIVE AND THINK OF FOOD AS MEDICINE. 75% OF YOUR PLATE SHOULD BE FRUITS/VEGGIES. To have more energy, better control your diabetes, and to lose weight:     1. CONTINUE YOUR WEIGHT LOSS EFFORTS. I RECOMMEND YOU READ AND FOLLOW RECOMMENDATIONS BY DR. MARK HYMAN, "10-DAY DETOX DIET".   2. If you must eat bread, EAT EZEKIEL BREAD. IT IS IN THE FROZEN SECTION OF THE GROCERY STORE.   3. DRINK WATER WITH FRUIT OR CUCUMBER ADDED. YOUR URINE SHOULD BE LIGHT YELLOW. AVOID SODA, GATORADE, ENERGY DRINKS, OR DIET SODA.    4. AVOID HIGH FRUCTOSE CORN SYRUP AND CAFFEINE.    5. DO NOT chew SUGAR FREE GUM OR USE ARTIFICIAL SWEETENERS. IF NEEDED USE STEVIA AS A SWEETENER.   6. DO NOT EAT ENRICHED WHEAT FLOUR, PASTA, RICE, OR CEREAL.   7. ONLY EAT WILD CAUGHT SEAFOOD, GRASS FED BEEF OR CHICKEN, PORK FROM PASTURE RAISE PIGS, OR EGGS FROM PASTURE RAISED CHICKENS.   8. PRACTICE CHAIR YOGA FOR 15-30 MINS 3 OR 4 TIMES A WEEK for 3 mos. Call when you ARE READY TO DO MOE ADVANCED EXERCISES.    9. START TAKING VITAMIN B12, AND VITAMIN D3 2000 IU DAILY.  ADDITIONAL SUPPLEMENTS TO DECREASE CRAVING, BURN FAT, CONTROL BLOOD SUGARS, AND SUPPRESS YOUR APPETITE:    1. CINNAMON 500 MG EVERY AM PRIOR TO FIRST MEAL.   **STABILIZES BLOOD GLUCOSE/REDUCES CRAVINGS**   2. CHROMIUM 400-500 MG WITH MEALS TWICE DAILY.    **FAT BURNER**   3. GREEN TEA EXTRACT ONE DAILY.   **FAT BURNER/SUPPRESSES YOUR APPETITE**   4. ALPHA LIPOIC ACID TWICE DAILY.   **NATURAL ANTI-INFLAMMATORY SUPPLEMENT THAT IS AN ALTERNATIVE TO IBUPROFEN OR NAPROXEN**  FOLLOW UP IN 6-12 MOS.
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
## 2019-01-18 NOTE — Assessment & Plan Note (Signed)
SYMPTOMS CONTROLLED/RESOLVED.  CONTINUE OMEPRAZOLE.  TAKE 30 MINUTES PRIOR TO YOUR MEALS ONCE DAILY. FOLLOW UP IN 6-12 MOS.

## 2019-01-19 ENCOUNTER — Ambulatory Visit: Payer: Medicare Other | Admitting: Gastroenterology

## 2019-01-19 NOTE — Progress Notes (Signed)
ON RECALL  °

## 2019-02-06 DIAGNOSIS — H18513 Endothelial corneal dystrophy, bilateral: Secondary | ICD-10-CM | POA: Diagnosis not present

## 2019-02-06 DIAGNOSIS — H25813 Combined forms of age-related cataract, bilateral: Secondary | ICD-10-CM | POA: Diagnosis not present

## 2019-02-13 ENCOUNTER — Other Ambulatory Visit: Payer: Self-pay

## 2019-02-13 ENCOUNTER — Other Ambulatory Visit: Payer: Self-pay | Admitting: Gastroenterology

## 2019-02-13 MED ORDER — HYDROCHLOROTHIAZIDE 25 MG PO TABS: 25.0000 mg | ORAL_TABLET | Freq: Every day | ORAL | 0 refills | Status: DC

## 2019-02-14 ENCOUNTER — Encounter: Payer: Self-pay | Admitting: Family Medicine

## 2019-02-14 ENCOUNTER — Other Ambulatory Visit: Payer: Self-pay

## 2019-02-14 ENCOUNTER — Ambulatory Visit (INDEPENDENT_AMBULATORY_CARE_PROVIDER_SITE_OTHER): Payer: Medicare Other | Admitting: Family Medicine

## 2019-02-14 VITALS — BP 130/65 | HR 97 | Resp 15 | Ht 69.0 in | Wt 212.0 lb

## 2019-02-14 DIAGNOSIS — Z0000 Encounter for general adult medical examination without abnormal findings: Secondary | ICD-10-CM

## 2019-02-14 NOTE — Patient Instructions (Signed)
Stacey Hanson , Thank you for taking time to come for your Medicare Wellness Visit. I appreciate your ongoing commitment to your health goals. Please review the following plan we discussed and let me know if I can assist you in the future.   Please continue to practice social distancing to keep you, your family, and our community safe.  If you must go out, please wear a Mask and practice good handwashing.  Want to wish you a happy safe and healthy holiday season and great start to the New Year.  Screening recommendations/referrals: Colonoscopy: up to date Mammogram: up to date  Bone Density: up to date Recommended yearly ophthalmology/optometry visit for glaucoma screening and checkup Recommended yearly dental visit for hygiene and checkup  Vaccinations: Influenza vaccine: up to date Pneumococcal vaccine: up to date Tdap vaccine: Due 2021 Shingles vaccine: Completed     Advanced directives: Does not have at this time, you reported working on it  Conditions/risks identified: Falls   Next appointment: 04/25/2019   Preventive Care 5 Years and Older, Female Preventive care refers to lifestyle choices and visits with your health care provider that can promote health and wellness. What does preventive care include?  A yearly physical exam. This is also called an annual well check.  Dental exams once or twice a year.  Routine eye exams. Ask your health care provider how often you should have your eyes checked.  Personal lifestyle choices, including:  Daily care of your teeth and gums.  Regular physical activity.  Eating a healthy diet.  Avoiding tobacco and drug use.  Limiting alcohol use.  Practicing safe sex.  Taking low-dose aspirin every day.  Taking vitamin and mineral supplements as recommended by your health care provider. What happens during an annual well check? The services and screenings done by your health care provider during your annual well check will  depend on your age, overall health, lifestyle risk factors, and family history of disease. Counseling  Your health care provider may ask you questions about your:  Alcohol use.  Tobacco use.  Drug use.  Emotional well-being.  Home and relationship well-being.  Sexual activity.  Eating habits.  History of falls.  Memory and ability to understand (cognition).  Work and work Statistician.  Reproductive health. Screening  You may have the following tests or measurements:  Height, weight, and BMI.  Blood pressure.  Lipid and cholesterol levels. These may be checked every 5 years, or more frequently if you are over 19 years old.  Skin check.  Lung cancer screening. You may have this screening every year starting at age 71 if you have a 30-pack-year history of smoking and currently smoke or have quit within the past 15 years.  Fecal occult blood test (FOBT) of the stool. You may have this test every year starting at age 11.  Flexible sigmoidoscopy or colonoscopy. You may have a sigmoidoscopy every 5 years or a colonoscopy every 10 years starting at age 78.  Hepatitis C blood test.  Hepatitis B blood test.  Sexually transmitted disease (STD) testing.  Diabetes screening. This is done by checking your blood sugar (glucose) after you have not eaten for a while (fasting). You may have this done every 1-3 years.  Bone density scan. This is done to screen for osteoporosis. You may have this done starting at age 48.  Mammogram. This may be done every 1-2 years. Talk to your health care provider about how often you should have regular mammograms. Talk with  your health care provider about your test results, treatment options, and if necessary, the need for more tests. Vaccines  Your health care provider may recommend certain vaccines, such as:  Influenza vaccine. This is recommended every year.  Tetanus, diphtheria, and acellular pertussis (Tdap, Td) vaccine. You may need a  Td booster every 10 years.  Zoster vaccine. You may need this after age 81.  Pneumococcal 13-valent conjugate (PCV13) vaccine. One dose is recommended after age 54.  Pneumococcal polysaccharide (PPSV23) vaccine. One dose is recommended after age 44. Talk to your health care provider about which screenings and vaccines you need and how often you need them. This information is not intended to replace advice given to you by your health care provider. Make sure you discuss any questions you have with your health care provider. Document Released: 03/15/2015 Document Revised: 11/06/2015 Document Reviewed: 12/18/2014 Elsevier Interactive Patient Education  2017 [place] Prevention in the Home Falls can cause injuries. They can happen to people of all ages. There are many things you can do to make your home safe and to help prevent falls. What can I do on the outside of my home?  Regularly fix the edges of walkways and driveways and fix any cracks.  Remove anything that might make you trip as you walk through a door, such as a raised step or threshold.  Trim any bushes or trees on the path to your home.  Use bright outdoor lighting.  Clear any walking paths of anything that might make someone trip, such as rocks or tools.  Regularly check to see if handrails are loose or broken. Make sure that both sides of any steps have handrails.  Any raised decks and porches should have guardrails on the edges.  Have any leaves, snow, or ice cleared regularly.  Use sand or salt on walking paths during winter.  Clean up any spills in your garage right away. This includes oil or grease spills. What can I do in the bathroom?  Use night lights.  Install grab bars by the toilet and in the tub and shower. Do not use towel bars as grab bars.  Use non-skid mats or decals in the tub or shower.  If you need to sit down in the shower, use a plastic, non-slip stool.  Keep the floor dry. Clean  up any water that spills on the floor as soon as it happens.  Remove soap buildup in the tub or shower regularly.  Attach bath mats securely with double-sided non-slip rug tape.  Do not have throw rugs and other things on the floor that can make you trip. What can I do in the bedroom?  Use night lights.  Make sure that you have a light by your bed that is easy to reach.  Do not use any sheets or blankets that are too big for your bed. They should not hang down onto the floor.  Have a firm chair that has side arms. You can use this for support while you get dressed.  Do not have throw rugs and other things on the floor that can make you trip. What can I do in the kitchen?  Clean up any spills right away.  Avoid walking on wet floors.  Keep items that you use a lot in easy-to-reach places.  If you need to reach something above you, use a strong step stool that has a grab bar.  Keep electrical cords out of the way.  Do not  use floor polish or wax that makes floors slippery. If you must use wax, use non-skid floor wax.  Do not have throw rugs and other things on the floor that can make you trip. What can I do with my stairs?  Do not leave any items on the stairs.  Make sure that there are handrails on both sides of the stairs and use them. Fix handrails that are broken or loose. Make sure that handrails are as long as the stairways.  Check any carpeting to make sure that it is firmly attached to the stairs. Fix any carpet that is loose or worn.  Avoid having throw rugs at the top or bottom of the stairs. If you do have throw rugs, attach them to the floor with carpet tape.  Make sure that you have a light switch at the top of the stairs and the bottom of the stairs. If you do not have them, ask someone to add them for you. What else can I do to help prevent falls?  Wear shoes that:  Do not have high heels.  Have rubber bottoms.  Are comfortable and fit you well.  Are  closed at the toe. Do not wear sandals.  If you use a stepladder:  Make sure that it is fully opened. Do not climb a closed stepladder.  Make sure that both sides of the stepladder are locked into place.  Ask someone to hold it for you, if possible.  Clearly mark and make sure that you can see:  Any grab bars or handrails.  First and last steps.  Where the edge of each step is.  Use tools that help you move around (mobility aids) if they are needed. These include:  Canes.  Walkers.  Scooters.  Crutches.  Turn on the lights when you go into a dark area. Replace any light bulbs as soon as they burn out.  Set up your furniture so you have a clear path. Avoid moving your furniture around.  If any of your floors are uneven, fix them.  If there are any pets around you, be aware of where they are.  Review your medicines with your doctor. Some medicines can make you feel dizzy. This can increase your chance of falling. Ask your doctor what other things that you can do to help prevent falls. This information is not intended to replace advice given to you by your health care provider. Make sure you discuss any questions you have with your health care provider. Document Released: 12/13/2008 Document Revised: 07/25/2015 Document Reviewed: 03/23/2014 Elsevier Interactive Patient Education  2017 [employer].

## 2019-02-14 NOTE — Progress Notes (Signed)
Subjective:   Stacey Hanson is a 73 y.o. female who presents for Medicare Annual (Subsequent) preventive examination.  Location of Patient: Home Location of Provider: Telehealth Consent was obtain for visit to be over via telehealth. I verified that I am speaking with the correct person using two identifiers.   Review of Systems:    Cardiac Risk Factors include: advanced age (>69mn, >>64women);diabetes mellitus;dyslipidemia;hypertension;obesity (BMI >30kg/m2)     Objective:     Vitals: BP 130/65   Pulse 97   Resp 15   Ht '5\' 9"'  (1.753 m)   Wt 212 lb (96.2 kg)   BMI 31.31 kg/m   Body mass index is 31.31 kg/m.  Advanced Directives 02/14/2018 02/09/2018 01/20/2018 12/21/2017 12/21/2017 12/13/2017 08/11/2017  Does Patient Have a Medical Advance Directive? No No No No No No No  Would patient like information on creating a medical advance directive? Yes (ED - Information included in AVS) Yes (ED - Information included in AVS) No - Patient declined No - Patient declined No - Patient declined No - Patient declined Yes (MAU/Ambulatory/Procedural Areas - Information given)  Pre-existing out of facility DNR order (yellow form or pink MOST form) - - - - - - -    Tobacco Social History   Tobacco Use  Smoking Status Former Smoker  . Types: Cigarettes  . Quit date: 02/20/1985  . Years since quitting: 34.0  Smokeless Tobacco Never Used     Counseling given: Yes   Clinical Intake:  Pre-visit preparation completed: No  Pain : No/denies pain Pain Score: 0-No pain     BMI - recorded: 31.31 Nutritional Status: BMI > 30  Obese Nutritional Risks: None Diabetes: Yes CBG done?: No Did pt. bring in CBG monitor from home?: No  How often do you need to have someone help you when you read instructions, pamphlets, or other written materials from your doctor or pharmacy?: 1 - Never What is the last grade level you completed in school?: 10  Interpreter Needed?: No     Past  Medical History:  Diagnosis Date  . BMI 30.0-30.9,adult 2012 215 LBS   2004 198 LBS  . Diabetes mellitus   . 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 H. pylori infection 09/24/2010  . Hyperlipidemia   . Hypertension   . Hypothyroidism   . 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 Knee pain   . Obesity    Past Surgical History:  Procedure Laterality Date  . ABDOMINAL HYSTERECTOMY  1970  . CHOLECYSTECTOMY  OCT 2010 BZ BILIARY DYSKINESIA   CHRONIC CHOLECYSTITIS  . COLONOSCOPY  MAY 2009 SCREENING   pTC TICS, SML IH  . COLONOSCOPY N/A 08/11/2017   Procedure: COLONOSCOPY;  Surgeon: FDanie Binder MD;  Location: [facility];  Service: Endoscopy;  Laterality: N/A;  9:30am  . ESOPHAGOGASTRODUODENOSCOPY  10/03/2010   Procedure: ESOPHAGOGASTRODUODENOSCOPY (EGD);  Surgeon: SDorothyann Peng MD;  Location: [facility];  Service: Endoscopy;  Laterality: N/A;  . ESOPHAGOGASTRODUODENOSCOPY  12/19/2002   RMR: Normal esophagus/Normal stomach/ Duodenum large bulbar diverticulum, 1 cm bulbar ulcer with surrounding/ inflammation as described above. Normal D2  . POLYPECTOMY  08/11/2017   Procedure: POLYPECTOMY;  Surgeon: FDanie Binder MD;  Location: [facility];  Service: Endoscopy;;  cecal   . SAVORY DILATION  10/03/2010   Procedure: SAVORY DILATION;  Surgeon: SDorothyann Peng MD;  Location: [facility];  Service: Endoscopy;;  .  TOTAL ABDOMINAL HYSTERECTOMY  1970  . TOTAL KNEE ARTHROPLASTY Left 12/21/2017   Procedure: LEFT TOTAL KNEE ARTHROPLASTY;  Surgeon: Paralee Cancel, MD;  Location: [facility];  Service: Orthopedics;  Laterality: Left;  70 mins block  . UPPER GASTROINTESTINAL ENDOSCOPY  OCT 2004 RMR   DUO TIC, & ULCER   Family History  Problem Relation Age of Onset  . Diabetes Mother   . Hypertension Mother   . Heart failure Mother   . Cancer Father 73       prostate and colon  . Diabetes Sister   . Hypertension Sister   . Hypertension Sister   .  Diabetes Sister   . Diabetes Brother   . Hypertension Brother   . Diabetes Brother   . Hypertension Brother   . Coronary artery disease Brother 35       MI  . Colon cancer Neg Hx   . Colon polyps Neg Hx    Social History   Socioeconomic History  . Marital status: Single    Spouse name: Not on file  . Number of children: 3  . Years of education: 10  . Highest education level: 10th grade  Occupational History  . Occupation: [employer]  . Occupation: Retired   Tobacco Use  . Smoking status: Former Smoker    Types: Cigarettes    Quit date: 02/20/1985    Years since quitting: 34.0  . Smokeless tobacco: Never Used  Substance and Sexual Activity  . Alcohol use: No  . Drug use: No  . Sexual activity: Not Currently  Other Topics Concern  . Not on file  Social History Narrative   Lives alone    Social Determinants of Health   Financial Resource Strain:   . Difficulty of Paying Living Expenses: Not on file  Food Insecurity:   . Worried About Charity fundraiser in the Last Year: Not on file  . Ran Out of Food in the Last Year: Not on file  Transportation Needs:   . Lack of Transportation (Medical): Not on file  . Lack of Transportation (Non-Medical): Not on file  Physical Activity:   . Days of Exercise per Week: Not on file  . Minutes of Exercise per Session: Not on file  Stress:   . Feeling of Stress : Not on file  Social Connections:   . Frequency of Communication with Friends and Family: Not on file  . Frequency of Social Gatherings with Friends and Family: Not on file  . Attends Religious Services: Not on file  . Active Member of Clubs or Organizations: Not on file  . Attends Archivist Meetings: Not on file  . Marital Status: Not on file    Outpatient Encounter Medications as of 02/14/2019  Medication Sig  . B-D UF III MINI PEN NEEDLES 31G X 5 MM MISC USE FOR TWICE DAILY INJECTIONS OF HUMALOG  . benazepril (LOTENSIN) 40 MG tablet TAKE 1 TABLET(40 MG) BY  MOUTH DAILY  . Blood Glucose Monitoring Suppl (ONE TOUCH ULTRA 2) w/Device KIT Twice daily testing dx e11.65  . Calcium Carb-Cholecalciferol (CALCIUM 600/VITAMIN D3 PO) Take 2 tablets by mouth daily.  Marland Kitchen docusate sodium (COLACE) 100 MG capsule Take 1 capsule (100 mg total) by mouth 2 (two) times daily.  . hydrochlorothiazide (HYDRODIURIL) 25 MG tablet Take 1 tablet (25 mg total) by mouth daily.  Marland Kitchen ibuprofen (ADVIL,MOTRIN) 600 MG tablet Take one tablet once daily, as needed, for  Pain. Maximum 3 tablets per week  .  Insulin Lispro Prot & Lispro (HUMALOG MIX 75/25 KWIKPEN) (75-25) 100 UNIT/ML Kwikpen ADMINISTER 40 UNITS UNDER THE SKIN TWICE DAILY  . Lancets (ONETOUCH DELICA PLUS [id number]) MISC USE 1 TO CHECK GLUCOSE TWICE DAILY AS DIRECTED  . levothyroxine (SYNTHROID) 88 MCG tablet TAKE 1 TABLET BY MOUTH EVERY DAY IN THE MORNING  . metFORMIN (GLUCOPHAGE) 1000 MG tablet TAKE 1 TABLET BY MOUTH TWICE DAILY WITH A MEAL  . Multiple Vitamin (MULTIVITAMIN) capsule Take 1 capsule by mouth daily.    Marland Kitchen omeprazole (PRILOSEC) 20 MG capsule TAKE 1 CAPSULE BY MOUTH EVERY MORNING  . ONETOUCH ULTRA test strip USE 1 STRIP TO CHECK GLUCOSE TWICE DAILY  . pravastatin (PRAVACHOL) 40 MG tablet TAKE 1 TABLET BY MOUTH EVERY DAY  . UNABLE TO FIND Diabetic shoes x 1 pair, Inserts x 3 pair DX E11.9   No facility-administered encounter medications on file as of 02/14/2019.    Activities of Daily Living In your present state of health, do you have any difficulty performing the following activities: 02/14/2019  Hearing? N  Vision? Y  Comment sometimes w/ cataracts  Difficulty concentrating or making decisions? N  Walking or climbing stairs? N  Dressing or bathing? N  Doing errands, shopping? N  Preparing Food and eating ? N  Using the Toilet? N  In the past six months, have you accidently leaked urine? N  Do you have problems with loss of bowel control? N  Managing your Medications? N  Managing your Finances? N   Housekeeping or managing your Housekeeping? N  Some recent data might be hidden    Patient Care Team: Fayrene Helper, MD as PCP - General Herminio Commons, MD as PCP - Cardiology (Cardiology) Danie Binder, MD (Gastroenterology) Leta Baptist, MD as Attending Physician (Otolaryngology) Cassandria Anger, MD (Endocrinology) Carole Civil, MD as Consulting Physician (Orthopedic Surgery) Madelin Headings, DO (Optometry) Bernell List, CPhT as [facility] (Pharmacy Technician)    Assessment:   This is a routine wellness examination for Shalonda.  Exercise Activities and Dietary recommendations Current Exercise Habits: Home exercise routine, Type of exercise: walking;stretching, Time (Minutes): 30, Frequency (Times/Week): 3, Weekly Exercise (Minutes/Week): 90, Intensity: Mild, Exercise limited by: None identified  Goals    . Increase physical activity    . Patient Stated     Want to get my knees well        Fall Risk Fall Risk  02/14/2019 11/23/2018 08/10/2018 06/22/2018 02/09/2018  Falls in the past year? 0 0 0 0 0  Number falls in past yr: 0 0 0 - 0  Injury with Fall? 0 0 0 0 -  Risk for fall due to : History of fall(s) - - - Medication side effect;Impaired balance/gait;Impaired mobility;Impaired vision  Follow up Falls evaluation completed;Education provided;Falls prevention discussed - - - Falls prevention discussed   Is the patient's home free of loose throw rugs in walkways, pet beds, electrical cords, etc?   yes      Grab bars in the bathroom? yes      Handrails on the stairs?   yes      Adequate lighting?   yes     Depression Screen PHQ 2/9 Scores 02/14/2019 11/23/2018 11/23/2018 06/22/2018  PHQ - 2 Score 1 1 0 0  PHQ- 9 Score - - - -     Cognitive Function     6CIT Screen 02/14/2019 02/09/2018 02/25/2017 02/25/2016  What Year? 0 points 0 points 0 points  0 points  What month? 0 points 0 points 0 points 0 points  What  time? 0 points 0 points 0 points 0 points  Count back from 20 0 points 0 points 0 points 0 points  Months in reverse 0 points 0 points 0 points 0 points  Repeat phrase 0 points 0 points 0 points 0 points  Total Score 0 0 0 0    Immunization History  Administered Date(s) Administered  . Fluad Quad(high Dose 65+) 11/23/2018  . Influenza,inj,Quad PF,6+ Mos 11/16/2012, 11/15/2013, 11/14/2014, 11/14/2015, 11/03/2016, 11/09/2017  . Pneumococcal Conjugate-13 10/11/2013  . Pneumococcal Polysaccharide-23 10/07/2011  . Zoster 04/05/2006    Qualifies for Shingles Vaccine? completed  Screening Tests Health Maintenance  Topic Date Due  . OPHTHALMOLOGY EXAM  10/05/2018  . HEMOGLOBIN A1C  05/09/2019  . TETANUS/TDAP  08/01/2019  . FOOT EXAM  11/23/2019  . MAMMOGRAM  08/10/2020  . COLONOSCOPY  08/12/2027  . INFLUENZA VACCINE  Completed  . DEXA SCAN  Completed  . Hepatitis C Screening  Completed  . PNA vac Low Risk Adult  Completed    Cancer Screenings: Lung: Low Dose CT Chest recommended if Age 16-80 years, 30 pack-year currently smoking OR have quit w/in 15years. Patient does not qualify. Breast:  Up to date on Mammogram? Yes   Up to date of Bone Density/Dexa? Yes  Colorectal:  Due 2029  Additional Screenings:   Hepatitis C Screening: completed     Plan:       1. Encounter for Medicare annual wellness exam   I have personally reviewed and noted the following in the patient's chart:   . Medical and social history . Use of alcohol, tobacco or illicit drugs  . Current medications and supplements . Functional ability and status . Nutritional status . Physical activity . dvanced directives . List of other physicians . Hospitalizations, surgeries, and ER visits in previous 12 months . Vitals . Screenings to include cognitive, depression, and falls . Referrals and appointments  In addition, I have reviewed and discussed with patient certain preventive protocols, quality  metrics, and best practice recommendations. A written personalized care plan for preventive services as well as general preventive health recommendations were provided to patient.   I provided 20 minutes of non-face-to-face time during this encounter.  Perlie Mayo, NP  02/14/2019

## 2019-02-21 ENCOUNTER — Other Ambulatory Visit: Payer: Self-pay

## 2019-02-21 MED ORDER — METFORMIN HCL 1000 MG PO TABS: ORAL_TABLET | ORAL | 1 refills | Status: DC

## 2019-02-26 ENCOUNTER — Other Ambulatory Visit: Payer: Self-pay | Admitting: Family Medicine

## 2019-02-28 NOTE — H&P (Signed)
Surgical History & Physical  Patient Name: Stacey Hanson DOB: 11/10/1945  Surgery: Cataract extraction with intraocular lens implant phacoemulsification; Left Eye  Surgeon: Baruch Goldmann MD Surgery Date:  03/09/2019 Pre-Op Date:  02/08/2019  HPI: A 52 Yr. old female patient is referred by Jorja Loa for cataract eval. 1. 1. The patient complains of difficulty when viewing TV, reading closed caption, news scrolls on TV, which began 1 year ago. Both eyes are affected. The episode is gradual. The condition's severity increased since last visit. Symptoms occur when the patient is driving, inside and outside. The complaint is associated with glare. This is negatively affecting the patient's quality of life. HPI Completed by Dr. Baruch Goldmann  Medical History: Dry Eyes Cataracts Arthritis Diabetes High Blood Pressure LDL Thyroid Problems  Review of Systems Eyes Vision loss All recorded systems are negative except as noted above.  Social   Former smoker   Medication Aspirin, Benazepril, HCTZ, Humalog, Levothyroxine Sodium, Metformin, Omeprazole, Pravastatin, Calcium Supplement, Vitamin D3, Ibuprofen, [employer] senior,   Sx/Procedures Knee Replacement, Hysterectomy, Gallbladder Removal,   Drug Allergies   NKDA  History & Physical: Heent:  Cataract, Left eye NECK: supple without bruits LUNGS: lungs clear to auscultation CV: regular rate and rhythm Abdomen: soft and non-tender  Impression & Plan: Assessment: 1.  COMBINED FORMS AGE RELATED CATARACT; Both Eyes (H25.813) 2.  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  Cataract accounts for the patient's decreased vision. This visual impairment is not correctable with a tolerable change in glasses or contact lenses. Cataract surgery with an implantation of a new lens should significantly improve the visual and functional status of the patient. Discussed all risks, benefits, alternatives, and potential complications.  Discussed the procedures and recovery. Patient desires to have surgery. A-scan ordered and performed today for intra-ocular lens calculations. The surgery will be performed in order to improve vision for driving, reading, and for eye examinations. Recommend phacoemulsification with intra-ocular lens. Left Eye worse - first. Dilates well - shugarcaine by protocol. Guttae - Viscoat. 2.  No edema. Cataracts are mild. Viscoat as above.

## 2019-03-06 DIAGNOSIS — H25812 Combined forms of age-related cataract, left eye: Secondary | ICD-10-CM | POA: Diagnosis not present

## 2019-03-06 NOTE — Patient Instructions (Addendum)
Stacey Hanson  03/06/2019     @PREFPERIOPPHARMACY @   Your procedure is scheduled on Thursday, 03/09/19.  Report to St[facility] at [place].  Call this number if you have problems the morning of surgery:  [phone]   Remember:  Do not eat or drink after midnight.      Take these medicines the morning of surgery with A SIP OF WATER banazepril, levothyroxine, omeprazole. Take 28 units of 75/25 insulin on Wednesday night before surgery and none the morning of surgery.    Do not wear jewelry, make-up or nail polish.  Do not wear lotions, powders, or perfumes, or deodorant.  Do not shave 48 hours prior to surgery.  Men may shave face and neck.  Do not bring valuables to the hospital.  [facility] is not responsible for any belongings or valuables.  Contacts, dentures or bridgework may not be worn into surgery.  Leave your suitcase in the car.  After surgery it may be brought to your room.  For patients admitted to the hospital, discharge time will be determined by your treatment team.  Patients discharged the day of surgery will not be allowed to drive home.   Name and phone number of your driver:   family Use drops as directed.  Please read over the following fact sheets that you were given. Anesthesia Post-op Instructions and Care and Recovery After Surgery     Cataract Surgery, Care After This sheet gives you information about how to care for yourself after your procedure. Your health care provider may also give you more specific instructions. If you have problems or questions, contact your health care provider. What can I expect after the procedure? After the procedure, it is common to have:  Itching.  Discomfort.  Fluid discharge.  Sensitivity to light and to touch.  Bruising in or around the eye.  Mild blurred vision. Follow these instructions at home: Eye care   Do not touch or rub your eyes.  Protect your eyes as told by your health care provider. You  may be told to wear a protective eye shield or sunglasses.  Do not put a contact lens into the affected eye or eyes until your health care provider approves.  Keep the area around your eye clean and dry: ? Avoid swimming. ? Do not allow water to hit you directly in the face while showering. ? Keep soap and shampoo out of your eyes.  Check your eye every day for signs of infection. Watch for: ? Redness, swelling, or pain. ? Fluid, blood, or pus. ? Warmth. ? A bad smell. ? Vision that is getting worse. ? Sensitivity that is getting worse. Activity  Do not drive for 24 hours if you were given a sedative during your procedure.  Avoid strenuous activities, such as playing contact sports, for as long as told by your health care provider.  Do not drive or use heavy machinery until your health care provider approves.  Do not bend or lift heavy objects. Bending increases pressure in the eye. You can walk, climb stairs, and do light household chores.  Ask your health care provider when you can return to work. If you work in a dusty environment, you may be advised to wear protective eyewear for a period of time. General instructions  Take or apply over-the-counter and prescription medicines only as told by your health care provider. This includes eye drops.  Keep all follow-up visits as told by your health care provider.  This is important. Contact a health care provider if:  You have increased bruising around your eye.  You have pain that is not helped with medicine.  You have a fever.  You have redness, swelling, or pain in your eye.  You have fluid, blood, or pus coming from your incision.  Your vision gets worse.  Your sensitivity to light gets worse. Get help right away if:  You have sudden loss of vision.  You see flashes of light or spots (floaters).  You have severe eye pain.  You develop nausea or vomiting. Summary  After your procedure, it is common to have  itching, discomfort, bruising, fluid discharge, or sensitivity to light.  Follow instructions from your health care provider about caring for your eye after the procedure.  Do not rub your eye after the procedure. You may need to wear eye protection or sunglasses. Do not wear contact lenses. Keep the area around your eye clean and dry.  Avoid activities that require a lot of effort. These include playing sports and lifting heavy objects.  Contact a health care provider if you have increased bruising, pain that does not go away, or a fever. Get help right away if you suddenly lose your vision, see flashes of light or spots, or have severe pain in the eye. This information is not intended to replace advice given to you by your health care provider. Make sure you discuss any questions you have with your health care provider. Document Revised: 12/13/2018 Document Reviewed: 08/16/2017 Elsevier Patient Education  [place]  A cataract is a buildup of protein that causes the lens of your eye to become cloudy. The lens is normally clear. It is the part of the eye that is behind your iris and pupil. The lens focuses light on the retina, which lets you see clearly. When a lens becomes cloudy, your vision may become blurry. The clouding can range from a tiny dot to complete cloudiness. As some cataracts develop, they can make it harder for you to see things that are far away. (You become more nearsighted.) Other cataracts increase glare. Cataracts can worsen over time, and sometimes the pupil can look white. As cataracts get worse, they cloud more of the lens, making it difficult to see. Cataracts can affect one eye or both eyes. What are the causes? This condition may be caused by age-related eye changes. The lens of the eye is mostly made up of water and protein. Normally, this protein is arranged in a way that keeps the lens clear. Cataracts develop when protein begins to clump together  over time. This buildup of protein clouds the lens and lets less light pass through to the retina, which causes blurry vision. What increases the risk? You are more likely to develop this condition if you:  Are 50 years of age or older.  Have diabetes.  Have high blood pressure.  Take certain medicines, such as steroids or hormone replacement therapy.  Have had an eye injury.  Have or have had eye inflammation.  Have a family history of cataracts.  Smoke.  Drink alcohol heavily.  Are frequently exposed to sun or very strong light without eye protection.  Are obese.  Have been exposed to large amounts of radiation, lead, or other toxic substances.  Have had eye surgery. What are the signs or symptoms? The main symptom of a cataract is blurry vision. Your vision may change or get worse over time. Other symptoms include:  Increased glare.  Seeing a bright ring or halo around light.  Poor night vision.  Double or "shadow" vision in one eye or both eyes.  Having trouble seeing, even while wearing contact lenses or glasses.  Seeing colors that appear faded.  Having trouble telling the difference between blue and purple.  Needing frequent changes to your prescription glasses or contacts. How is this diagnosed? This condition is diagnosed with a medical history and eye exam.  You should see an eye specialist (optometrist or ophthalmologist).  Your health care provider may enlarge (dilate) your pupils with eye drops to see the back of your eye more clearly and look for signs of cataracts or other eye damage. You may also have tests, including:  A visual acuity test. This uses a chart to determine the smallest letters that you can see from a specific distance.  A slit-lamp exam. This uses a microscope to examine small sections of your eye for abnormalities.  Tonometry. This test measures the pressure of the fluid inside your eye.  Glare testing. This test shines a  light in your eye while you view letters to see whether the bright light affects your vision. How is this treated? Treatment depends on the stage of your cataract. You may:  Wear eyeglasses or use stronger light. This is for an early-stage cataract.  Have surgery if the condition is severely affecting your vision. This is needed for late-stage cataract.  Stop or change certain medicines. This is recommended if your health care provider thinks your cataract may be linked to your medicines. Follow these instructions at home: Lifestyle  Use stronger or brighter lighting.  Consider using a magnifying glass for reading or other activities.  Become familiar with your surroundings. Having poor vision can put you at greater risk for tripping, falling, or bumping into things.  Wear sunglasses and a hat if you are sensitive to bright light or are having problems with glare.  Do not use any products that contain nicotine or tobacco, such as cigarettes, e-cigarettes, and chewing tobacco. If you need help quitting, ask your health care provider. General instructions  If you are prescribed new eyeglasses, wear them as told by your health care provider.  Take over-the-counter and prescription medicines only as told by your health care provider. Do not change your medicines unless told by your health care provider.  Do not drive or use heavy machinery if your vision is blurry, particularly at night.  Keep your blood sugar under control if you have diabetes.  Keep all follow-up visits as told by your health care provider. This is important. Contact a health care provider if:  Your symptoms get worse.  Your vision affects your ability to perform daily activities.  You have new symptoms.  You have a fever. Get help right away if:  You have sudden vision loss.  You have redness, swelling, or increasing pain in your eye.  You develop a headache and sensitivity to light. Summary  A  cataract is a buildup of protein that causes the lens of your eye to become cloudy. Cataracts are very common, especially as people age.  Mild cataracts cause mild visual symptoms, while more severe cataracts can cause a significant decrease in quality of life.  Mild cataracts can often be treated with a prescription for new glasses or contact lenses, while surgery is often recommended for more severe cataracts.  Contact a health care provider if your symptoms get worse, your vision affects your ability to  do daily activities, or you have a fever.  Get help right away if you have sudden vision loss, redness, swelling, or increasing pain in the eye, or you develop a headache or sensitivity to light. This information is not intended to replace advice given to you by your health care provider. Make sure you discuss any questions you have with your health care provider. Document Revised: 08/16/2017 Document Reviewed: 08/16/2017 Elsevier Patient Education  [place].

## 2019-03-07 ENCOUNTER — Other Ambulatory Visit (HOSPITAL_COMMUNITY)
Admission: RE | Admit: 2019-03-07 | Discharge: 2019-03-07 | Disposition: A | Discharge status: Home / Self Care | Payer: Medicare Other | Source: Ambulatory Visit | Attending: Ophthalmology | Admitting: Ophthalmology

## 2019-03-07 ENCOUNTER — Encounter (HOSPITAL_COMMUNITY): Payer: Self-pay

## 2019-03-07 ENCOUNTER — Encounter (HOSPITAL_COMMUNITY)
Admission: RE | Admit: 2019-03-07 | Discharge: 2019-03-07 | Disposition: A | Discharge status: Home / Self Care | Payer: Medicare Other | Source: Ambulatory Visit | Attending: Ophthalmology | Admitting: Ophthalmology

## 2019-03-07 ENCOUNTER — Other Ambulatory Visit: Payer: Self-pay

## 2019-03-07 DIAGNOSIS — Z01812 Encounter for preprocedural laboratory examination: Secondary | ICD-10-CM | POA: Diagnosis not present

## 2019-03-07 DIAGNOSIS — H2512 Age-related nuclear cataract, left eye: Secondary | ICD-10-CM | POA: Insufficient documentation

## 2019-03-07 DIAGNOSIS — Z794 Long term (current) use of insulin: Secondary | ICD-10-CM | POA: Diagnosis not present

## 2019-03-07 DIAGNOSIS — E119 Type 2 diabetes mellitus without complications: Secondary | ICD-10-CM | POA: Diagnosis not present

## 2019-03-07 DIAGNOSIS — Z7982 Long term (current) use of aspirin: Secondary | ICD-10-CM | POA: Diagnosis not present

## 2019-03-07 DIAGNOSIS — I10 Essential (primary) hypertension: Secondary | ICD-10-CM | POA: Diagnosis not present

## 2019-03-07 DIAGNOSIS — Z79899 Other long term (current) drug therapy: Secondary | ICD-10-CM | POA: Insufficient documentation

## 2019-03-07 DIAGNOSIS — Z87891 Personal history of nicotine dependence: Secondary | ICD-10-CM | POA: Insufficient documentation

## 2019-03-07 HISTORY — DX: Gastro-esophageal reflux disease without esophagitis: K21.9

## 2019-03-07 LAB — HEMOGLOBIN A1C
Hgb A1c MFr Bld: 9.8 % — ABNORMAL HIGH (ref 4.8–5.6)
Mean Plasma Glucose: 234.56 mg/dL

## 2019-03-07 LAB — BASIC METABOLIC PANEL
Anion gap: 13 (ref 5–15)
BUN: 18 mg/dL (ref 8–23)
CO2: 23 mmol/L (ref 22–32)
Calcium: 8.9 mg/dL (ref 8.9–10.3)
Chloride: 97 mmol/L — ABNORMAL LOW (ref 98–111)
Creatinine, Ser: 1.07 mg/dL — ABNORMAL HIGH (ref 0.44–1.00)
GFR calc Af Amer: 60 mL/min — ABNORMAL LOW (ref >60)
GFR calc non Af Amer: 51 mL/min — ABNORMAL LOW (ref >60)
Glucose, Bld: 412 mg/dL — ABNORMAL HIGH (ref 70–99)
Potassium: 3.7 mmol/L (ref 3.5–5.1)
Sodium: 133 mmol/L — ABNORMAL LOW (ref 135–145)

## 2019-03-07 LAB — GLUCOSE, CAPILLARY: Glucose-Capillary: 404 mg/dL — ABNORMAL HIGH (ref 70–99)

## 2019-03-07 LAB — SARS CORONAVIRUS 2 (TAT 6-24 HRS): SARS Coronavirus 2: NEGATIVE

## 2019-03-09 ENCOUNTER — Encounter (HOSPITAL_COMMUNITY): Payer: Self-pay | Admitting: Ophthalmology

## 2019-03-09 ENCOUNTER — Encounter (HOSPITAL_COMMUNITY)
Admission: RE | Disposition: A | Discharge status: Home / Self Care | Payer: Self-pay | Source: Home / Self Care | Attending: Ophthalmology

## 2019-03-09 ENCOUNTER — Ambulatory Visit (HOSPITAL_COMMUNITY): Payer: Medicare Other | Admitting: Anesthesiology

## 2019-03-09 ENCOUNTER — Ambulatory Visit (HOSPITAL_COMMUNITY)
Admission: RE | Admit: 2019-03-09 | Discharge: 2019-03-09 | Disposition: A | Discharge status: Home / Self Care | Payer: Medicare Other | Attending: Ophthalmology | Admitting: Ophthalmology

## 2019-03-09 DIAGNOSIS — H25813 Combined forms of age-related cataract, bilateral: Secondary | ICD-10-CM | POA: Diagnosis not present

## 2019-03-09 DIAGNOSIS — E109 Type 1 diabetes mellitus without complications: Secondary | ICD-10-CM | POA: Diagnosis not present

## 2019-03-09 DIAGNOSIS — Z87891 Personal history of nicotine dependence: Secondary | ICD-10-CM | POA: Diagnosis not present

## 2019-03-09 DIAGNOSIS — E039 Hypothyroidism, unspecified: Secondary | ICD-10-CM | POA: Insufficient documentation

## 2019-03-09 DIAGNOSIS — E1065 Type 1 diabetes mellitus with hyperglycemia: Secondary | ICD-10-CM | POA: Insufficient documentation

## 2019-03-09 DIAGNOSIS — Z794 Long term (current) use of insulin: Secondary | ICD-10-CM | POA: Insufficient documentation

## 2019-03-09 DIAGNOSIS — H2181 Floppy iris syndrome: Secondary | ICD-10-CM | POA: Insufficient documentation

## 2019-03-09 DIAGNOSIS — E1036 Type 1 diabetes mellitus with diabetic cataract: Secondary | ICD-10-CM | POA: Insufficient documentation

## 2019-03-09 DIAGNOSIS — K219 Gastro-esophageal reflux disease without esophagitis: Secondary | ICD-10-CM | POA: Insufficient documentation

## 2019-03-09 DIAGNOSIS — Z7982 Long term (current) use of aspirin: Secondary | ICD-10-CM | POA: Insufficient documentation

## 2019-03-09 DIAGNOSIS — I10 Essential (primary) hypertension: Secondary | ICD-10-CM | POA: Diagnosis not present

## 2019-03-09 DIAGNOSIS — Z79899 Other long term (current) drug therapy: Secondary | ICD-10-CM | POA: Diagnosis not present

## 2019-03-09 DIAGNOSIS — H18513 Endothelial corneal dystrophy, bilateral: Secondary | ICD-10-CM | POA: Insufficient documentation

## 2019-03-09 DIAGNOSIS — Z79890 Hormone replacement therapy (postmenopausal): Secondary | ICD-10-CM | POA: Diagnosis not present

## 2019-03-09 DIAGNOSIS — Z96659 Presence of unspecified artificial knee joint: Secondary | ICD-10-CM | POA: Insufficient documentation

## 2019-03-09 DIAGNOSIS — H25812 Combined forms of age-related cataract, left eye: Secondary | ICD-10-CM | POA: Diagnosis not present

## 2019-03-09 HISTORY — PX: CATARACT EXTRACTION W/PHACO: SHX586

## 2019-03-09 LAB — GLUCOSE, CAPILLARY: Glucose-Capillary: 304 mg/dL — ABNORMAL HIGH (ref 70–99)

## 2019-03-09 SURGERY — PHACOEMULSIFICATION, CATARACT, WITH IOL INSERTION
Anesthesia: Monitor Anesthesia Care | Site: Eye | Laterality: Left

## 2019-03-09 MED ORDER — CYCLOPENTOLATE-PHENYLEPHRINE 0.2-1 % OP SOLN
1.0000 [drp] | OPHTHALMIC | Status: DC | PRN
  Administered 2019-03-09 (×2): 1 [drp] via OPHTHALMIC

## 2019-03-09 MED ORDER — PROVISC 10 MG/ML IO SOLN
INTRAOCULAR | Status: DC | PRN
  Administered 2019-03-09: 0.85 mL via INTRAOCULAR

## 2019-03-09 MED ORDER — LACTATED RINGERS IV SOLN: INTRAVENOUS | Status: DC

## 2019-03-09 MED ORDER — HYDROMORPHONE HCL 1 MG/ML IJ SOLN: 0.2500 mg | INTRAMUSCULAR | Status: DC | PRN

## 2019-03-09 MED ORDER — BSS IO SOLN
INTRAOCULAR | Status: DC | PRN
  Administered 2019-03-09: 15 mL via INTRAOCULAR

## 2019-03-09 MED ORDER — NA HYALUR & NA CHOND-NA HYALUR 0.55-0.5 ML IO KIT
PACK | INTRAOCULAR | Status: DC | PRN
  Administered 2019-03-09: 1 via OPHTHALMIC

## 2019-03-09 MED ORDER — PHENYLEPHRINE HCL 2.5 % OP SOLN
1.0000 [drp] | OPHTHALMIC | Status: DC | PRN
  Administered 2019-03-09 (×2): 1 [drp] via OPHTHALMIC

## 2019-03-09 MED ORDER — EPINEPHRINE PF 1 MG/ML IJ SOLN
INTRAOCULAR | Status: DC | PRN
  Administered 2019-03-09: 500 mL

## 2019-03-09 MED ORDER — LIDOCAINE HCL (PF) 1 % IJ SOLN
INTRAOCULAR | Status: DC | PRN
  Administered 2019-03-09: 09:00:00 1 mL via OPHTHALMIC

## 2019-03-09 MED ORDER — EPINEPHRINE PF 1 MG/ML IJ SOLN
INTRAMUSCULAR | Status: AC
  Filled 2019-03-09: qty 2

## 2019-03-09 MED ORDER — LIDOCAINE HCL 3.5 % OP GEL: 1.0000 "application " | Freq: Once | OPHTHALMIC | Status: DC

## 2019-03-09 MED ORDER — POVIDONE-IODINE 5 % OP SOLN
OPHTHALMIC | Status: DC | PRN
  Administered 2019-03-09: 1 via OPHTHALMIC

## 2019-03-09 MED ORDER — SODIUM HYALURONATE 23 MG/ML IO SOLN
INTRAOCULAR | Status: DC | PRN
  Administered 2019-03-09: 0.6 mL via INTRAOCULAR

## 2019-03-09 MED ORDER — PROMETHAZINE HCL 25 MG/ML IJ SOLN: 6.2500 mg | INTRAMUSCULAR | Status: DC | PRN

## 2019-03-09 MED ORDER — HYDROCODONE-ACETAMINOPHEN 7.5-325 MG PO TABS: 1.0000 | ORAL_TABLET | Freq: Once | ORAL | Status: DC | PRN

## 2019-03-09 MED ORDER — NEOMYCIN-POLYMYXIN-DEXAMETH 3.5-10000-0.1 OP SUSP
OPHTHALMIC | Status: DC | PRN
  Administered 2019-03-09: 1 [drp] via OPHTHALMIC

## 2019-03-09 MED ORDER — TETRACAINE HCL 0.5 % OP SOLN
1.0000 [drp] | OPHTHALMIC | Status: DC | PRN
  Administered 2019-03-09 (×2): 1 [drp] via OPHTHALMIC

## 2019-03-09 MED ORDER — MIDAZOLAM HCL 2 MG/2ML IJ SOLN: 0.5000 mg | Freq: Once | INTRAMUSCULAR | Status: DC | PRN

## 2019-03-09 SURGICAL SUPPLY — 15 items
CLOTH BEACON ORANGE TIMEOUT ST (SAFETY) ×3 IMPLANT
EYE SHIELD UNIVERSAL CLEAR (GAUZE/BANDAGES/DRESSINGS) ×3 IMPLANT
GLOVE BIOGEL PI IND STRL 6.5 (GLOVE) ×1 IMPLANT
GLOVE BIOGEL PI IND STRL 7.0 (GLOVE) ×1 IMPLANT
GLOVE BIOGEL PI INDICATOR 6.5 (GLOVE) ×2
GLOVE BIOGEL PI INDICATOR 7.0 (GLOVE) ×2
LENS ALC ACRYL/TECN (Ophthalmic Related) ×3 IMPLANT
NEEDLE HYPO 18GX1.5 BLUNT FILL (NEEDLE) ×3 IMPLANT
PAD ARMBOARD 7.5X6 YLW CONV (MISCELLANEOUS) ×3 IMPLANT
RING MALYGIN 7.0 (MISCELLANEOUS) ×3 IMPLANT
SYR TB 1ML LL NO SAFETY (SYRINGE) ×3 IMPLANT
TAPE SURG TRANSPORE 1 IN (GAUZE/BANDAGES/DRESSINGS) ×1 IMPLANT
TAPE SURGICAL TRANSPORE 1 IN (GAUZE/BANDAGES/DRESSINGS) ×2
VISCOELASTIC ADDITIONAL (OPHTHALMIC RELATED) ×6 IMPLANT
WATER STERILE IRR 250ML POUR (IV SOLUTION) ×3 IMPLANT

## 2019-03-09 NOTE — Anesthesia Preprocedure Evaluation (Signed)
Anesthesia Evaluation  Patient identified by MRN, date of birth, ID band Patient awake    Reviewed: Allergy & Precautions, NPO status , Patient's Chart, lab work & pertinent test results  Airway Mallampati: II  TM Distance: >3 FB Neck ROM: Full    Dental no notable dental hx. (+) Teeth Intact   Pulmonary neg pulmonary ROS, former smoker,    Pulmonary exam normal breath sounds clear to auscultation       Cardiovascular Exercise Tolerance: Good hypertension, Pt. on medications negative cardio ROS Normal cardiovascular examI Rhythm:Regular Rate:Normal     Neuro/Psych negative neurological ROS  negative psych ROS   GI/Hepatic Neg liver ROS, GERD  Medicated and Controlled,  Endo/Other  diabetes, Poorly Controlled, Type 1, Oral Hypoglycemic Agents, Insulin DependentHypothyroidism   Renal/GU negative Renal ROS  negative genitourinary   Musculoskeletal  (+) Arthritis , Osteoarthritis,    Abdominal   Peds negative pediatric ROS (+)  Hematology negative hematology ROS (+)   Anesthesia Other Findings   Reproductive/Obstetrics negative OB ROS                             Anesthesia Physical Anesthesia Plan  ASA: III  Anesthesia Plan: MAC   Post-op Pain Management:    Induction: Intravenous  PONV Risk Score and Plan: 2 and TIVA, Treatment may vary due to age or medical condition and Ondansetron  Airway Management Planned: Simple Face Mask and Nasal Cannula  Additional Equipment:   Intra-op Plan:   Post-operative Plan: Extubation in OR  Informed Consent: I have reviewed the patients History and Physical, chart, labs and discussed the procedure including the risks, benefits and alternatives for the proposed anesthesia with the patient or authorized representative who has indicated his/her understanding and acceptance.     Dental advisory given  Plan Discussed with: CRNA  Anesthesia  Plan Comments: (Plan Full PPE use  Plan MAC d/w pt -WTP with same after Q&A)        Anesthesia Quick Evaluation

## 2019-03-09 NOTE — Transfer of Care (Signed)
Immediate Anesthesia Transfer of Care Note  Patient: Stacey Hanson  Procedure(s) Performed: CATARACT EXTRACTION PHACO AND INTRAOCULAR LENS PLACEMENT ([facility]) (Left Eye)  Patient Location: Short Stay  Anesthesia Type:MAC  Level of Consciousness: awake, alert , oriented and patient cooperative  Airway & Oxygen Therapy: Patient Spontanous Breathing  Post-op Assessment: Report given to RN and Post -op Vital signs reviewed and stable  Post vital signs: Reviewed and stable  Last Vitals:  Vitals Value Taken Time  BP    Temp    Pulse    Resp    SpO2      Last Pain:  Vitals:   03/09/19 0854  TempSrc: Oral  PainSc: 0-No pain      Patients Stated Pain Goal: 5 (74/12/87 [id number])  Complications: No apparent anesthesia complications

## 2019-03-09 NOTE — Discharge Instructions (Signed)
Please discharge patient when stable, will follow up today with Dr. Jaylinn Hellenbrand at the [facility] [place] office immediately following discharge.  Leave shield in place until visit.  All paperwork with discharge instructions will be given at the office.  [facility] [place] Address:  [address]  [place], [place] [address]  

## 2019-03-09 NOTE — Op Note (Signed)
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 Umaima Scholten, MD, MA  Anesthesia: MAC, Topical [place]  Complications: None  Estimated Blood Loss: <27m (minimal)  Specimens: None  Implants: As above  Indications:  Visually significant cataract, Left Eye  Procedure:  The patient was seen and identified in the pre-operative area. The operative eye was identified and dilated.  The operative eye was marked.  Topical anesthesia was administered to the operative eye.     The patient was then to the operative suite and placed in the supine position.  A timeout was performed confirming the patient, procedure to be performed, and all other relevant information.   The patient's face was prepped and draped in the usual fashion for intra-ocular surgery.  A lid speculum was placed into the operative eye and the surgical microscope moved into place and focused.  Poor dilation of the iris was confirmed.  An inferotemporal paracentesis was created using a 20 gauge paracentesis blade.  Shugarcaine was injected into the anterior chamber.  Viscoelastic was injected into the anterior chamber.  A temporal clear-corneal main wound incision was created using a 2.46mmicrokeratome.  A Malyugin ring was placed.  A continuous curvilinear capsulorrhexis was initiated using an irrigating cystitome and completed using capsulorrhexis forceps.  Hydrodissection and hydrodeliniation were performed.  Viscoelastic was injected into the anterior chamber.  A phacoemulsification handpiece and a  chopper as a second instrument were used to remove the nucleus and epinucleus. The irrigation/aspiration handpiece was used to remove any remaining cortical material.   The capsular bag was reinflated with viscoelastic, checked, and found to be intact.  The intraocular lens was inserted into the capsular bag and dialed into place using a Kuglen hook. The Malyugin ring was removed.  The irrigation/aspiration handpiece was used to remove any remaining viscoelastic.  The clear corneal wound and paracentesis wounds were then hydrated and checked with Weck-Cels to be watertight.  The lid-speculum and drape was removed, and the patient's face was cleaned with a wet and dry 4x4.  Maxitrol was instilled in the eye before a clear shield was taped over the eye. The patient was taken to the post-operative care unit in good condition, having tolerated the procedure well.  Post-Op Instructions: [facility] and will receive all other orders and instructions.

## 2019-03-09 NOTE — Interval H&P Note (Signed)
History and Physical Interval Note: The H and P was reviewed and updated. The patient was examined.  No changes were found after exam.  The surgical eye was marked.  03/09/2019 9:02 AM  Stacey Hanson  has presented today for surgery, with the diagnosis of Nuclear sclerotic cataract - Left eye.  The various methods of treatment have been discussed with the patient and family. After consideration of risks, benefits and other options for treatment, the patient has consented to  Procedure(s) with comments: CATARACT EXTRACTION PHACO AND INTRAOCULAR LENS PLACEMENT ([place]) (Left) - left as a surgical intervention.  The patient's history has been reviewed, patient examined, no change in status, stable for surgery.  I have reviewed the patient's chart and labs.  Questions were answered to the patient's satisfaction.     Baruch Goldmann

## 2019-03-09 NOTE — Anesthesia Procedure Notes (Signed)
Procedure Name: MAC Date/Time: 03/09/2019 9:05 AM Performed by: Andree Elk Bryler Dibble A, CRNA Pre-anesthesia Checklist: Patient identified, Emergency Drugs available, Suction available, Timeout performed and Patient being monitored Patient Re-evaluated:Patient Re-evaluated prior to induction Oxygen Delivery Method: Nasal Cannula

## 2019-03-09 NOTE — Anesthesia Postprocedure Evaluation (Signed)
Anesthesia Post Note  Patient: Stacey Hanson  Procedure(s) Performed: CATARACT EXTRACTION PHACO AND INTRAOCULAR LENS PLACEMENT ([place]) (Left Eye)  Patient location during evaluation: Short Stay Anesthesia Type: MAC Level of consciousness: awake and alert, oriented and patient cooperative Pain management: pain level controlled Vital Signs Assessment: post-procedure vital signs reviewed and stable Respiratory status: spontaneous breathing Postop Assessment: no apparent nausea or vomiting Anesthetic complications: no     Last Vitals:  Vitals:   03/09/19 0854  BP: (!) 148/67  Pulse: 91  Resp: 20  Temp: 36.9 C  SpO2: 97%    Last Pain:  Vitals:   03/09/19 0854  TempSrc: Oral  PainSc: 0-No pain                 Claxton Levitz A

## 2019-03-15 ENCOUNTER — Other Ambulatory Visit: Payer: Self-pay | Admitting: Family Medicine

## 2019-03-15 ENCOUNTER — Other Ambulatory Visit: Payer: Self-pay

## 2019-03-15 MED ORDER — PRAVASTATIN SODIUM 40 MG PO TABS: 40.0000 mg | ORAL_TABLET | Freq: Every day | ORAL | 1 refills | Status: DC

## 2019-03-17 NOTE — H&P (Signed)
Surgical History & Physical  Patient Name: Stacey Hanson DOB: 12/12/1945  Surgery: Cataract extraction with intraocular lens implant phacoemulsification; Right Eye  Surgeon: Baruch Goldmann MD Surgery Date:  03/27/2019 Pre-Op Date:  03/16/2019  HPI: A 9 Yr. old female patient 1. 1. The patient is returning after cataract surgery. The left eye is affected. Status post cataract surgery on 03-09-2019: Onset was S/P STD. Since the last visit, the affected area feels improvement and is doing well. The patient's vision is improved. Patient is following medication instructions with Ilevro QD, Moxi TID, and PA TID OS. Patient can see OD is cloudy c/w OS. OS brighter with increased overall VA. OD blurred and hazy. This is negatively affecting the patient's quality of life. HPI Completed by Dr. Baruch Goldmann  Medical History: Dry Eyes Cataracts Arthritis Diabetes High Blood Pressure LDL Thyroid Problems  Review of Systems Eyes Vision loss All recorded systems are negative except as noted above.  Social   Former smoker  Medication Moxifloxacin, Ilevro, Prednisolone acetate 1%,  Aspirin, Benazepril, HCTZ, Humalog, Levothyroxine Sodium, Metformin, Omeprazole, Pravastatin, Calcium Supplement, Vitamin D3, Ibuprofen, Sentry senior,   Sx/Procedures Phaco c IOL OS,  Knee Replacement, Hysterectomy, Gallbladder Removal,   Drug Allergies   NKDA  History & Physical: Heent:  Cataract, Right eye NECK: supple without bruits LUNGS: lungs clear to auscultation CV: regular rate and rhythm Abdomen: soft and non-tender  Impression & Plan: Assessment: 1.  COMBINED FORMS AGE RELATED CATARACT; Right Eye (H25.811) 2.  CATARACT EXTRACTION STATUS; Left Eye (Z98.42)  Plan: 1.  Cataract accounts for the patient's decreased vision. This visual impairment is not correctable with a tolerable change in glasses or contact lenses. Cataract surgery with an implantation of a new lens should significantly  improve the visual and functional status of the patient. Discussed all risks, benefits, alternatives, and potential complications. Discussed the procedures and recovery. Patient desires to have surgery. A-scan ordered and performed today for intra-ocular lens calculations. The surgery will be performed in order to improve vision for driving, reading, and for eye examinations. Recommend phacoemulsification with intra-ocular lens. Right Eye. Surgery required to correct imbalance of vision. Dilates moderately - shugarcaine by protocol. [place] - Viscoat. 2.  1 week after cataract surgery. Doing well with improved vision and normal eye pressure. Call with any problems or concerns. Stop Vigamox. Continue Ilevro 1 drop 1x/day for 3 more weeks. Continue Pred Acetate 1 drop 2x/day for 3 more weeks.

## 2019-03-22 ENCOUNTER — Other Ambulatory Visit: Payer: Self-pay

## 2019-03-22 ENCOUNTER — Encounter (HOSPITAL_COMMUNITY): Payer: Self-pay

## 2019-03-23 ENCOUNTER — Encounter (HOSPITAL_COMMUNITY)
Admission: RE | Admit: 2019-03-23 | Discharge: 2019-03-23 | Disposition: A | Discharge status: Home / Self Care | Payer: Medicare Other | Source: Ambulatory Visit | Attending: Ophthalmology | Admitting: Ophthalmology

## 2019-03-23 DIAGNOSIS — H25811 Combined forms of age-related cataract, right eye: Secondary | ICD-10-CM | POA: Diagnosis not present

## 2019-03-24 ENCOUNTER — Other Ambulatory Visit (HOSPITAL_COMMUNITY)
Admission: RE | Admit: 2019-03-24 | Discharge: 2019-03-24 | Disposition: A | Discharge status: Home / Self Care | Payer: Medicare Other | Source: Ambulatory Visit | Attending: Ophthalmology | Admitting: Ophthalmology

## 2019-03-24 ENCOUNTER — Other Ambulatory Visit: Payer: Self-pay

## 2019-03-24 DIAGNOSIS — Z20822 Contact with and (suspected) exposure to covid-19: Secondary | ICD-10-CM | POA: Insufficient documentation

## 2019-03-24 DIAGNOSIS — Z01812 Encounter for preprocedural laboratory examination: Secondary | ICD-10-CM | POA: Diagnosis not present

## 2019-03-24 LAB — SARS CORONAVIRUS 2 (TAT 6-24 HRS): SARS Coronavirus 2: NEGATIVE

## 2019-03-27 ENCOUNTER — Encounter (HOSPITAL_COMMUNITY)
Admission: RE | Disposition: A | Discharge status: Home / Self Care | Payer: Self-pay | Source: Home / Self Care | Attending: Ophthalmology

## 2019-03-27 ENCOUNTER — Ambulatory Visit (HOSPITAL_COMMUNITY): Payer: Medicare Other | Admitting: Anesthesiology

## 2019-03-27 ENCOUNTER — Ambulatory Visit (HOSPITAL_COMMUNITY)
Admission: RE | Admit: 2019-03-27 | Discharge: 2019-03-27 | Disposition: A | Discharge status: Home / Self Care | Payer: Medicare Other | Attending: Ophthalmology | Admitting: Ophthalmology

## 2019-03-27 ENCOUNTER — Other Ambulatory Visit: Payer: Self-pay

## 2019-03-27 DIAGNOSIS — H2181 Floppy iris syndrome: Secondary | ICD-10-CM | POA: Diagnosis not present

## 2019-03-27 DIAGNOSIS — Z79899 Other long term (current) drug therapy: Secondary | ICD-10-CM | POA: Diagnosis not present

## 2019-03-27 DIAGNOSIS — Z87891 Personal history of nicotine dependence: Secondary | ICD-10-CM | POA: Insufficient documentation

## 2019-03-27 DIAGNOSIS — M1990 Unspecified osteoarthritis, unspecified site: Secondary | ICD-10-CM | POA: Diagnosis not present

## 2019-03-27 DIAGNOSIS — Z9842 Cataract extraction status, left eye: Secondary | ICD-10-CM | POA: Insufficient documentation

## 2019-03-27 DIAGNOSIS — Z7982 Long term (current) use of aspirin: Secondary | ICD-10-CM | POA: Diagnosis not present

## 2019-03-27 DIAGNOSIS — Z79890 Hormone replacement therapy (postmenopausal): Secondary | ICD-10-CM | POA: Insufficient documentation

## 2019-03-27 DIAGNOSIS — I10 Essential (primary) hypertension: Secondary | ICD-10-CM | POA: Insufficient documentation

## 2019-03-27 DIAGNOSIS — Z794 Long term (current) use of insulin: Secondary | ICD-10-CM | POA: Diagnosis not present

## 2019-03-27 DIAGNOSIS — H25811 Combined forms of age-related cataract, right eye: Secondary | ICD-10-CM | POA: Insufficient documentation

## 2019-03-27 DIAGNOSIS — K219 Gastro-esophageal reflux disease without esophagitis: Secondary | ICD-10-CM | POA: Diagnosis not present

## 2019-03-27 DIAGNOSIS — E039 Hypothyroidism, unspecified: Secondary | ICD-10-CM | POA: Diagnosis not present

## 2019-03-27 DIAGNOSIS — E1036 Type 1 diabetes mellitus with diabetic cataract: Secondary | ICD-10-CM | POA: Insufficient documentation

## 2019-03-27 DIAGNOSIS — Z961 Presence of intraocular lens: Secondary | ICD-10-CM | POA: Diagnosis not present

## 2019-03-27 DIAGNOSIS — E109 Type 1 diabetes mellitus without complications: Secondary | ICD-10-CM | POA: Diagnosis not present

## 2019-03-27 HISTORY — PX: CATARACT EXTRACTION W/PHACO: SHX586

## 2019-03-27 LAB — GLUCOSE, CAPILLARY: Glucose-Capillary: 290 mg/dL — ABNORMAL HIGH (ref 70–99)

## 2019-03-27 SURGERY — PHACOEMULSIFICATION, CATARACT, WITH IOL INSERTION
Anesthesia: Monitor Anesthesia Care | Site: Eye | Laterality: Right

## 2019-03-27 MED ORDER — MIDAZOLAM HCL 2 MG/2ML IJ SOLN: 0.5000 mg | Freq: Once | INTRAMUSCULAR | Status: DC | PRN

## 2019-03-27 MED ORDER — HYDROCODONE-ACETAMINOPHEN 7.5-325 MG PO TABS: 1.0000 | ORAL_TABLET | Freq: Once | ORAL | Status: DC | PRN

## 2019-03-27 MED ORDER — FENTANYL CITRATE (PF) 100 MCG/2ML IJ SOLN
INTRAMUSCULAR | Status: AC
  Filled 2019-03-27: qty 2

## 2019-03-27 MED ORDER — MIDAZOLAM HCL 2 MG/2ML IJ SOLN
INTRAMUSCULAR | Status: DC | PRN
  Administered 2019-03-27: 2 mg via INTRAVENOUS

## 2019-03-27 MED ORDER — NEOMYCIN-POLYMYXIN-DEXAMETH 3.5-10000-0.1 OP SUSP
OPHTHALMIC | Status: DC | PRN
  Administered 2019-03-27: 1 [drp] via OPHTHALMIC

## 2019-03-27 MED ORDER — SODIUM HYALURONATE 23 MG/ML IO SOLN
INTRAOCULAR | Status: DC | PRN
  Administered 2019-03-27: 0.6 mL via INTRAOCULAR

## 2019-03-27 MED ORDER — PROMETHAZINE HCL 25 MG/ML IJ SOLN: 6.2500 mg | INTRAMUSCULAR | Status: DC | PRN

## 2019-03-27 MED ORDER — FENTANYL CITRATE (PF) 100 MCG/2ML IJ SOLN
INTRAMUSCULAR | Status: DC | PRN
  Administered 2019-03-27: 50 ug via INTRAVENOUS

## 2019-03-27 MED ORDER — PHENYLEPHRINE HCL 2.5 % OP SOLN
1.0000 [drp] | OPHTHALMIC | Status: AC | PRN
  Administered 2019-03-27 (×3): 1 [drp] via OPHTHALMIC

## 2019-03-27 MED ORDER — CYCLOPENTOLATE-PHENYLEPHRINE 0.2-1 % OP SOLN
1.0000 [drp] | OPHTHALMIC | Status: AC | PRN
  Administered 2019-03-27 (×3): 1 [drp] via OPHTHALMIC

## 2019-03-27 MED ORDER — TETRACAINE HCL 0.5 % OP SOLN
1.0000 [drp] | OPHTHALMIC | Status: AC | PRN
  Administered 2019-03-27 (×3): 1 [drp] via OPHTHALMIC

## 2019-03-27 MED ORDER — EPINEPHRINE PF 1 MG/ML IJ SOLN
INTRAMUSCULAR | Status: AC
  Filled 2019-03-27: qty 2

## 2019-03-27 MED ORDER — MIDAZOLAM HCL 2 MG/2ML IJ SOLN
INTRAMUSCULAR | Status: AC
  Filled 2019-03-27: qty 2

## 2019-03-27 MED ORDER — LIDOCAINE HCL (PF) 1 % IJ SOLN
INTRAOCULAR | Status: DC | PRN
  Administered 2019-03-27: .9 mL via OPHTHALMIC

## 2019-03-27 MED ORDER — BSS IO SOLN
INTRAOCULAR | Status: DC | PRN
  Administered 2019-03-27: 15 mL via INTRAOCULAR

## 2019-03-27 MED ORDER — POVIDONE-IODINE 5 % OP SOLN
OPHTHALMIC | Status: DC | PRN
  Administered 2019-03-27: 1 via OPHTHALMIC

## 2019-03-27 MED ORDER — NA HYALUR & NA CHOND-NA HYALUR 0.55-0.5 ML IO KIT
PACK | INTRAOCULAR | Status: DC | PRN
  Administered 2019-03-27: 1 via OPHTHALMIC

## 2019-03-27 MED ORDER — HYDROMORPHONE HCL 1 MG/ML IJ SOLN: 0.2500 mg | INTRAMUSCULAR | Status: DC | PRN

## 2019-03-27 MED ORDER — EPINEPHRINE PF 1 MG/ML IJ SOLN
INTRAOCULAR | Status: DC | PRN
  Administered 2019-03-27: 500 mL

## 2019-03-27 MED ORDER — LIDOCAINE HCL 3.5 % OP GEL
1.0000 "application " | Freq: Once | OPHTHALMIC | Status: AC
  Administered 2019-03-27: 1 via OPHTHALMIC

## 2019-03-27 MED ORDER — NEOMYCIN-POLYMYXIN-DEXAMETH 3.5-10000-0.1 OP SUSP
OPHTHALMIC | Status: AC
  Filled 2019-03-27: qty 5

## 2019-03-27 SURGICAL SUPPLY — 13 items
CLOTH BEACON ORANGE TIMEOUT ST (SAFETY) ×3 IMPLANT
EYE SHIELD UNIVERSAL CLEAR (GAUZE/BANDAGES/DRESSINGS) ×3 IMPLANT
GLOVE BIOGEL PI IND STRL 7.0 (GLOVE) ×2 IMPLANT
GLOVE BIOGEL PI INDICATOR 7.0 (GLOVE) ×4
LENS ALC ACRYL/TECN (Ophthalmic Related) ×3 IMPLANT
NEEDLE HYPO 18GX1.5 BLUNT FILL (NEEDLE) ×3 IMPLANT
PAD ARMBOARD 7.5X6 YLW CONV (MISCELLANEOUS) ×3 IMPLANT
RING MALYGIN 7.0 (MISCELLANEOUS) ×3 IMPLANT
SYR TB 1ML LL NO SAFETY (SYRINGE) ×3 IMPLANT
TAPE SURG TRANSPORE 1 IN (GAUZE/BANDAGES/DRESSINGS) ×1 IMPLANT
TAPE SURGICAL TRANSPORE 1 IN (GAUZE/BANDAGES/DRESSINGS) ×2
VISCOELASTIC ADDITIONAL (OPHTHALMIC RELATED) ×3 IMPLANT
WATER STERILE IRR 250ML POUR (IV SOLUTION) ×3 IMPLANT

## 2019-03-27 NOTE — Anesthesia Postprocedure Evaluation (Signed)
Anesthesia Post Note  Patient: Stacey Hanson  Procedure(s) Performed: CATARACT EXTRACTION PHACO AND INTRAOCULAR LENS PLACEMENT RIGHT EYE (Right Eye)  Patient location during evaluation: PACU Anesthesia Type: MAC Level of consciousness: awake and alert, oriented and patient cooperative Pain management: pain level controlled Vital Signs Assessment: post-procedure vital signs reviewed and stable Respiratory status: spontaneous breathing, nonlabored ventilation and respiratory function stable Cardiovascular status: stable Postop Assessment: no apparent nausea or vomiting Anesthetic complications: no     Last Vitals:  Vitals:   03/27/19 0844  BP: (!) 155/77  Pulse: 92  Resp: 20  Temp: 37.1 C  SpO2: 99%    Last Pain:  Vitals:   03/27/19 0844  PainSc: 0-No pain                 Jamieson Hetland

## 2019-03-27 NOTE — Op Note (Signed)
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 Elanora Quin, MD, MA  Anesthesia: MAC, Topical Akten  Complications: None  Estimated Blood Loss: <19m (minimal)  Specimens: None  Implants: As above  Indications:  Visually significant cataract, Right Eye  Procedure:  The patient was seen and identified in the pre-operative area. The operative eye was identified and dilated.  The operative eye was marked.  Topical anesthesia was administered to the operative eye.     The patient was then to the operative suite and placed in the supine position.  A timeout was performed confirming the patient, procedure to be performed, and all other relevant information.   The patient's face was prepped and draped in the usual fashion for intra-ocular surgery.  A lid speculum was placed into the operative eye and the surgical microscope moved into place and focused.  Poor dilation of the iris was confirmed.  A superotemporal paracentesis was created using a 20 gauge paracentesis blade.  Shugarcaine was injected into the anterior chamber.  Viscoelastic was injected into the anterior chamber.  A temporal clear-corneal main wound incision was created using a 2.410mmicrokeratome.  A Malyugin ring was placed.  A continuous curvilinear capsulorrhexis was initiated using an irrigating cystitome and completed using capsulorrhexis forceps.  Hydrodissection and hydrodeliniation were performed.  Viscoelastic was injected into the anterior chamber.  A phacoemulsification handpiece and a  chopper as a second instrument were used to remove the nucleus and epinucleus. The irrigation/aspiration handpiece was used to remove any remaining cortical material.   The capsular bag was reinflated with viscoelastic, checked, and found to be intact.  The intraocular lens was inserted into the capsular bag and dialed into place using a Kuglen hook.  The Malyugin ring was removed.  The irrigation/aspiration handpiece was used to remove any remaining viscoelastic.  The clear corneal wound and paracentesis wounds were then hydrated and checked with Weck-Cels to be watertight.  The lid-speculum and drape was removed, and the patient's face was cleaned with a wet and dry 4x4.  Maxitrol was instilled in the eye before a clear shield was taped over the eye. The patient was taken to the post-operative care unit in good condition, having tolerated the procedure well.  Post-Op Instructions: [facility] and will receive all other orders and instructions.

## 2019-03-27 NOTE — Interval H&P Note (Signed)
History and Physical Interval Note: The H and P was reviewed and updated. The patient was examined.  No changes were found after exam.  The surgical eye was marked.  03/27/2019 10:06 AM  Stacey Hanson  has presented today for surgery, with the diagnosis of Nuclear sclerotic cataract - Right eye.  The various methods of treatment have been discussed with the patient and family. After consideration of risks, benefits and other options for treatment, the patient has consented to  Procedure(s) with comments: CATARACT EXTRACTION PHACO AND INTRAOCULAR LENS PLACEMENT RIGHT EYE (Right) - right as a surgical intervention.  The patient's history has been reviewed, patient examined, no change in status, stable for surgery.  I have reviewed the patient's chart and labs.  Questions were answered to the patient's satisfaction.     Baruch Goldmann

## 2019-03-27 NOTE — Transfer of Care (Signed)
Immediate Anesthesia Transfer of Care Note  Patient: Stacey Hanson  Procedure(s) Performed: CATARACT EXTRACTION PHACO AND INTRAOCULAR LENS PLACEMENT RIGHT EYE (Right Eye)  Patient Location: PACU  Anesthesia Type:MAC  Level of Consciousness: awake, alert , oriented and patient cooperative  Airway & Oxygen Therapy: Patient Spontanous Breathing  Post-op Assessment: Report given to RN  Post vital signs: Reviewed and stable  Last Vitals:  Vitals Value Taken Time  BP    Temp    Pulse    Resp    SpO2      Last Pain:  Vitals:   03/27/19 0844  PainSc: 0-No pain         Complications: No apparent anesthesia complications

## 2019-03-27 NOTE — Anesthesia Preprocedure Evaluation (Signed)
Anesthesia Evaluation  Patient identified by MRN, date of birth, ID band Patient awake    Reviewed: Allergy & Precautions, NPO status , Patient's Chart, lab work & pertinent test results  Airway Mallampati: II  TM Distance: >3 FB Neck ROM: Full    Dental no notable dental hx. (+) Teeth Intact   Pulmonary neg pulmonary ROS, former smoker,    Pulmonary exam normal breath sounds clear to auscultation       Cardiovascular hypertension, Pt. on medications negative cardio ROS Normal cardiovascular examI Rhythm:Regular Rate:Normal     Neuro/Psych negative neurological ROS  negative psych ROS   GI/Hepatic Neg liver ROS, GERD  Medicated and Controlled,  Endo/Other  diabetes, Poorly Controlled, Type 1, Insulin DependentHypothyroidism   Renal/GU negative Renal ROS  negative genitourinary   Musculoskeletal  (+) Arthritis , Osteoarthritis,    Abdominal   Peds negative pediatric ROS (+)  Hematology negative hematology ROS (+)   Anesthesia Other Findings   Reproductive/Obstetrics negative OB ROS                             Anesthesia Physical Anesthesia Plan  ASA: III  Anesthesia Plan: MAC   Post-op Pain Management:    Induction: Intravenous  PONV Risk Score and Plan: 2 and Propofol infusion, TIVA and Treatment may vary due to age or medical condition  Airway Management Planned: Nasal Cannula and Simple Face Mask  Additional Equipment:   Intra-op Plan:   Post-operative Plan:   Informed Consent: I have reviewed the patients History and Physical, chart, labs and discussed the procedure including the risks, benefits and alternatives for the proposed anesthesia with the patient or authorized representative who has indicated his/her understanding and acceptance.     Dental advisory given  Plan Discussed with: CRNA  Anesthesia Plan Comments: (Plan Full PPE use  Plan MAC as previous, d/w  pt -WTP with same after Q&A  S/p L IOL on 03/09/19)        Anesthesia Quick Evaluation

## 2019-03-27 NOTE — Discharge Instructions (Addendum)
Please discharge patient when stable, will follow up today with Dr. Wrzosek at the [facility] office immediately following discharge.  Leave shield in place until visit.  All paperwork with discharge instructions will be given at the office.  [facility] Address:  [address]  [place], [place] [address]             Monitored Anesthesia Care, Care After These instructions provide you with information about caring for yourself after your procedure. Your health care provider may also give you more specific instructions. Your treatment has been planned according to current medical practices, but problems sometimes occur. Call your health care provider if you have any problems or questions after your procedure. What can I expect after the procedure? After your procedure, you may:  Feel sleepy for several hours.  Feel clumsy and have poor balance for several hours.  Feel forgetful about what happened after the procedure.  Have poor judgment for several hours.  Feel nauseous or vomit.  Have a sore throat if you had a breathing tube during the procedure. Follow these instructions at home: For at least 24 hours after the procedure:      Have a responsible adult stay with you. It is important to have someone help care for you until you are awake and alert.  Rest as needed.  Do not: ? Participate in activities in which you could fall or become injured. ? Drive. ? Use heavy machinery. ? Drink alcohol. ? Take sleeping pills or medicines that cause drowsiness. ? Make important decisions or sign legal documents. ? Take care of children on your own. Eating and drinking  Follow the diet that is recommended by your health care provider.  If you vomit, drink water, juice, or soup when you can drink without vomiting.  Make sure you have little or no nausea before eating solid foods. General instructions  Take over-the-counter and  prescription medicines only as told by your health care provider.  If you have sleep apnea, surgery and certain medicines can increase your risk for breathing problems. Follow instructions from your health care provider about wearing your sleep device: ? Anytime you are sleeping, including during daytime naps. ? While taking prescription pain medicines, sleeping medicines, or medicines that make you drowsy.  If you smoke, do not smoke without supervision.  Keep all follow-up visits as told by your health care provider. This is important. Contact a health care provider if:  You keep feeling nauseous or you keep vomiting.  You feel light-headed.  You develop a rash.  You have a fever. Get help right away if:  You have trouble breathing. Summary  For several hours after your procedure, you may feel sleepy and have poor judgment.  Have a responsible adult stay with you for at least 24 hours or until you are awake and alert. This information is not intended to replace advice given to you by your health care provider. Make sure you discuss any questions you have with your health care provider. Document Revised: 05/17/2017 Document Reviewed: 06/09/2015 Elsevier Patient Education  2020 [employer].  

## 2019-04-13 ENCOUNTER — Telehealth: Payer: Self-pay | Admitting: "Endocrinology

## 2019-04-13 ENCOUNTER — Other Ambulatory Visit: Payer: Self-pay

## 2019-04-13 DIAGNOSIS — E890 Postprocedural hypothyroidism: Secondary | ICD-10-CM

## 2019-04-13 NOTE — Telephone Encounter (Signed)
Can you update lab order °

## 2019-04-13 NOTE — Telephone Encounter (Signed)
Lab orders updated and sent to [facility]. 

## 2019-04-17 ENCOUNTER — Ambulatory Visit: Payer: Medicare Other | Admitting: "Endocrinology

## 2019-04-25 ENCOUNTER — Ambulatory Visit: Payer: Medicare Other | Admitting: Family Medicine

## 2019-05-14 ENCOUNTER — Other Ambulatory Visit: Payer: Self-pay | Admitting: Family Medicine

## 2019-05-14 ENCOUNTER — Other Ambulatory Visit: Payer: Self-pay | Admitting: "Endocrinology

## 2019-05-22 ENCOUNTER — Other Ambulatory Visit: Payer: Self-pay | Admitting: Family Medicine

## 2019-05-22 ENCOUNTER — Other Ambulatory Visit: Payer: Self-pay | Admitting: "Endocrinology

## 2019-05-22 ENCOUNTER — Other Ambulatory Visit: Payer: Self-pay

## 2019-05-22 DIAGNOSIS — E890 Postprocedural hypothyroidism: Secondary | ICD-10-CM | POA: Diagnosis not present

## 2019-05-22 DIAGNOSIS — E1159 Type 2 diabetes mellitus with other circulatory complications: Secondary | ICD-10-CM | POA: Diagnosis not present

## 2019-05-22 DIAGNOSIS — E782 Mixed hyperlipidemia: Secondary | ICD-10-CM | POA: Diagnosis not present

## 2019-05-22 MED ORDER — LEVOTHYROXINE SODIUM 88 MCG PO TABS: 88.0000 ug | ORAL_TABLET | Freq: Every day | ORAL | 0 refills | Status: DC

## 2019-05-23 ENCOUNTER — Other Ambulatory Visit: Payer: Self-pay | Admitting: "Endocrinology

## 2019-05-23 ENCOUNTER — Other Ambulatory Visit: Payer: Self-pay | Admitting: Family Medicine

## 2019-05-23 DIAGNOSIS — Z794 Long term (current) use of insulin: Secondary | ICD-10-CM

## 2019-05-23 DIAGNOSIS — E1165 Type 2 diabetes mellitus with hyperglycemia: Secondary | ICD-10-CM

## 2019-05-23 LAB — LIPID PANEL W/O CHOL/HDL RATIO
Cholesterol, Total: 179 mg/dL (ref 100–199)
HDL: 66 mg/dL (ref >39)
LDL Chol Calc (NIH): 95 mg/dL (ref 0–99)
Triglycerides: 99 mg/dL (ref 0–149)
VLDL Cholesterol Cal: 18 mg/dL (ref 5–40)

## 2019-05-23 LAB — COMPREHENSIVE METABOLIC PANEL
ALT: 17 IU/L (ref 0–32)
AST: 20 IU/L (ref 0–40)
Albumin/Globulin Ratio: 1.3 (ref 1.2–2.2)
Albumin: 4.4 g/dL (ref 3.7–4.7)
Alkaline Phosphatase: 115 IU/L (ref 39–117)
BUN/Creatinine Ratio: 18 (ref 12–28)
BUN: 21 mg/dL (ref 8–27)
Bilirubin Total: 0.3 mg/dL (ref 0.0–1.2)
CO2: 20 mmol/L (ref 20–29)
Calcium: 10 mg/dL (ref 8.7–10.3)
Chloride: 98 mmol/L (ref 96–106)
Creatinine, Ser: 1.2 mg/dL — ABNORMAL HIGH (ref 0.57–1.00)
GFR calc Af Amer: 52 mL/min/{1.73_m2} — ABNORMAL LOW (ref >59)
GFR calc non Af Amer: 45 mL/min/{1.73_m2} — ABNORMAL LOW (ref >59)
Globulin, Total: 3.3 g/dL (ref 1.5–4.5)
Glucose: 141 mg/dL — ABNORMAL HIGH (ref 65–99)
Potassium: 3.7 mmol/L (ref 3.5–5.2)
Sodium: 139 mmol/L (ref 134–144)
Total Protein: 7.7 g/dL (ref 6.0–8.5)

## 2019-05-23 LAB — TSH: TSH: 0.257 u[IU]/mL — ABNORMAL LOW (ref 0.450–4.500)

## 2019-05-23 LAB — T4, FREE: Free T4: 1.53 ng/dL (ref 0.82–1.77)

## 2019-05-23 LAB — HGB A1C W/O EAG: Hgb A1c MFr Bld: 10 % — ABNORMAL HIGH (ref 4.8–5.6)

## 2019-05-23 LAB — SPECIMEN STATUS REPORT

## 2019-05-25 ENCOUNTER — Encounter: Payer: Self-pay | Admitting: Family Medicine

## 2019-05-25 ENCOUNTER — Ambulatory Visit: Payer: Medicare Other | Admitting: Family Medicine

## 2019-05-25 ENCOUNTER — Other Ambulatory Visit: Payer: Self-pay

## 2019-05-25 ENCOUNTER — Ambulatory Visit (INDEPENDENT_AMBULATORY_CARE_PROVIDER_SITE_OTHER): Payer: Medicare Other | Admitting: Family Medicine

## 2019-05-25 VITALS — BP 135/79 | HR 100 | Temp 98.3°F | Ht 69.0 in | Wt 216.8 lb

## 2019-05-25 DIAGNOSIS — E1165 Type 2 diabetes mellitus with hyperglycemia: Secondary | ICD-10-CM

## 2019-05-25 DIAGNOSIS — Z794 Long term (current) use of insulin: Secondary | ICD-10-CM | POA: Diagnosis not present

## 2019-05-25 NOTE — Patient Instructions (Signed)
° ° ° °  If you have lab work done today you will be contacted with your lab results within the next 2 weeks.  If you have not heard from us then please contact us. The fastest way to get your results is to register for My Chart. ° ° °IF you received an x-ray today, you will receive an invoice from [facility]. Please contact  Radiology at [phone] with questions or concerns regarding your invoice.  ° °IF you received labwork today, you will receive an invoice from [facility]. Please contact [facility] at [phone] with questions or concerns regarding your invoice.  ° °Our billing staff will not be able to assist you with questions regarding bills from these companies. ° °You will be contacted with the lab results as soon as they are available. The fastest way to get your results is to activate your My Chart account. Instructions are located on the last page of this paperwork. If you have not heard from us regarding the results in 2 weeks, please contact this office. °  ° ° ° °

## 2019-05-25 NOTE — Progress Notes (Signed)
Established Patient Office Visit  Subjective:  Patient ID: Stacey Hanson, female    DOB: [birth year]  Age: 74 y.o. MRN: [id number]  CC:  Chief Complaint  Patient presents with  . Diabetes    f/u get get checked for diabetic shoes    HPI Stacey Hanson presents for foot exam for DM-pt needs diabetic shoes. Pt states tingling in feet bilat. Numbness episodically. Pain in the feet bilat.  Arthritis in the feet.  Pt with cramping in the feet. Pt with no ulcers.  No calloses. Pt able to walk with no assistance. A1c 10.0-sees Dr. Dorris Fetch on Monday for regulation of medication  Past Medical History:  Diagnosis Date  . BMI 30.0-30.9,adult 2012 215 LBS   2004 198 LBS  . Diabetes mellitus   . 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 GERD (gastroesophageal reflux disease)   . H. pylori infection 09/24/2010  . Hyperlipidemia   . Hypertension   . Hypothyroidism   . 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 Knee pain   . Obesity     Past Surgical History:  Procedure Laterality Date  . ABDOMINAL HYSTERECTOMY  1970  . CATARACT EXTRACTION W/PHACO Left 03/09/2019   Procedure: CATARACT EXTRACTION PHACO AND INTRAOCULAR LENS PLACEMENT (IOC);  Surgeon: Baruch Goldmann, MD;  Location: [facility];  Service: Ophthalmology;  Laterality: Left;  CDE: 9.08  . CATARACT EXTRACTION W/PHACO Right 03/27/2019   Procedure: CATARACT EXTRACTION PHACO AND INTRAOCULAR LENS PLACEMENT RIGHT EYE;  Surgeon: Baruch Goldmann, MD;  Location: [facility];  Service: Ophthalmology;  Laterality: Right;  CDE: 7.47  . CHOLECYSTECTOMY  OCT 2010 BZ BILIARY DYSKINESIA   CHRONIC CHOLECYSTITIS  . COLONOSCOPY  MAY 2009 SCREENING   pTC TICS, SML IH  . COLONOSCOPY N/A 08/11/2017   Procedure: COLONOSCOPY;  Surgeon: Danie Binder, MD;  Location: [facility];  Service: Endoscopy;  Laterality: N/A;  9:30am  . ESOPHAGOGASTRODUODENOSCOPY  10/03/2010   Procedure: ESOPHAGOGASTRODUODENOSCOPY (EGD);  Surgeon: Dorothyann Peng, MD;  Location: [facility];  Service: Endoscopy;  Laterality: N/A;  . ESOPHAGOGASTRODUODENOSCOPY  12/19/2002   RMR: Normal esophagus/Normal stomach/ Duodenum large bulbar diverticulum, 1 cm bulbar ulcer with surrounding/ inflammation as described above. Normal D2  . POLYPECTOMY  08/11/2017   Procedure: POLYPECTOMY;  Surgeon: Danie Binder, MD;  Location: [facility];  Service: Endoscopy;;  cecal   . SAVORY DILATION  10/03/2010   Procedure: SAVORY DILATION;  Surgeon: Dorothyann Peng, MD;  Location: [facility];  Service: Endoscopy;;  . TOTAL ABDOMINAL HYSTERECTOMY  1970  . TOTAL KNEE ARTHROPLASTY Left 12/21/2017   Procedure: LEFT TOTAL KNEE ARTHROPLASTY;  Surgeon: Paralee Cancel, MD;  Location: [facility];  Service: Orthopedics;  Laterality: Left;  70 mins block  . UPPER GASTROINTESTINAL ENDOSCOPY  OCT 2004 RMR   DUO TIC, & ULCER    Family History  Problem Relation Age of Onset  . Diabetes Mother   . Hypertension Mother   . Heart failure Mother   . Cancer Father 46       prostate and colon  . Diabetes Sister   . Hypertension Sister   . Hypertension Sister   . Diabetes Sister   . Diabetes Brother   . Hypertension Brother   . Diabetes Brother   . Hypertension Brother   . Coronary artery disease Brother 35       MI  . Colon cancer Neg Hx   .  Colon polyps Neg Hx     Social History   Socioeconomic History  . Marital status: Single    Spouse name: Not on file  . Number of children: 3  . Years of education: 10  . Highest education level: 10th grade  Occupational History  . Occupation: [employer]  . Occupation: Retired   Tobacco Use  . Smoking status: Former Smoker    Packs/day: 0.25    Years: 10.00    Pack years: 2.50    Types: Cigarettes    Quit date: 02/20/1985    Years since quitting: 34.2  . Smokeless tobacco: Never Used  Substance and Sexual Activity  . Alcohol use: No  . Drug use: No  . Sexual activity: Not Currently  Other Topics Concern  . Not on file   Social History Narrative   Lives alone    Social Determinants of Health   Financial Resource Strain:   . Difficulty of Paying Living Expenses:   Food Insecurity:   . Worried About Charity fundraiser in the Last Year:   . Arboriculturist in the Last Year:   Transportation Needs:   . Film/video editor (Medical):   Marland Kitchen Lack of Transportation (Non-Medical):   Physical Activity:   . Days of Exercise per Week:   . Minutes of Exercise per Session:   Stress:   . Feeling of Stress :   Social Connections:   . Frequency of Communication with Friends and Family:   . Frequency of Social Gatherings with Friends and Family:   . Attends Religious Services:   . Active Member of Clubs or Organizations:   . Attends Archivist Meetings:   Marland Kitchen Marital Status:   Intimate Partner Violence:   . Fear of Current or Ex-Partner:   . Emotionally Abused:   Marland Kitchen Physically Abused:   . Sexually Abused:     Outpatient Medications Prior to Visit  Medication Sig Dispense Refill  . aspirin EC 81 MG tablet Take 81 mg by mouth daily.    . B-D UF III MINI PEN NEEDLES 31G X 5 MM MISC USE FOR TWICE DAILY INJECTIONS OF HUMALOG 100 each 6  . benazepril (LOTENSIN) 40 MG tablet TAKE 1 TABLET(40 MG) BY MOUTH DAILY 30 tablet 3  . Blood Glucose Monitoring Suppl (ONE TOUCH ULTRA 2) w/Device KIT Twice daily testing dx e11.65 1 kit 0  . Calcium Carb-Cholecalciferol (CALCIUM 600/VITAMIN D3 PO) Take 2 tablets by mouth daily.    . hydrochlorothiazide (HYDRODIURIL) 25 MG tablet TAKE 1 TABLET(25 MG) BY MOUTH DAILY 90 tablet 0  . ibuprofen (ADVIL,MOTRIN) 600 MG tablet Take one tablet once daily, as needed, for  Pain. Maximum 3 tablets per week (Patient taking differently: Take 600 mg by mouth every 8 (eight) hours as needed for moderate pain. ) 30 tablet 1  . Insulin Lispro Prot & Lispro (HUMALOG MIX 75/25 KWIKPEN) (75-25) 100 UNIT/ML Kwikpen ADMINISTER 40 UNITS UNDER THE SKIN TWICE DAILY (Patient taking differently:  Inject 40 Units into the skin 2 (two) times daily. ADMINISTER 40 UNITS UNDER THE SKIN TWICE DAILY) 30 mL 4  . Lancets (ONETOUCH DELICA PLUS [id number]) MISC USE 1 TO CHECK GLUCOSE TWICE DAILY AS DIRECTED 100 each 5  . levothyroxine (SYNTHROID) 88 MCG tablet TAKE 1 TABLET(88 MCG) BY MOUTH DAILY BEFORE BREAKFAST 90 tablet 0  . metFORMIN (GLUCOPHAGE) 1000 MG tablet TAKE 1 TABLET BY MOUTH TWICE DAILY WITH A MEAL 180 tablet 1  . Multiple Vitamin (MULTIVITAMIN)  capsule Take 1 capsule by mouth daily.      Marland Kitchen omeprazole (PRILOSEC) 20 MG capsule TAKE 1 CAPSULE BY MOUTH EVERY MORNING (Patient taking differently: Take 20 mg by mouth every morning. ) 90 capsule 3  . ONETOUCH ULTRA test strip USE 1 STRIP TO CHECK GLUCOSE TWICE DAILY 100 each 0  . pravastatin (PRAVACHOL) 40 MG tablet Take 1 tablet (40 mg total) by mouth daily. 90 tablet 1  . UNABLE TO FIND Diabetic shoes x 1 pair, Inserts x 3 pair DX E11.9 1 each 0   No facility-administered medications prior to visit.    Allergies  Allergen Reactions  . Simvastatin Other (See Comments)    Muscle cramps    ROS Review of Systems  Constitutional: Negative.   HENT: Negative.   Eyes:       Recent surgery-seeing better after cataract surgery  Respiratory: Negative.   Cardiovascular: Negative.   Gastrointestinal: Negative.   Endocrine:       DM  Allergic/Immunologic: Negative.  Negative for environmental allergies.  Neurological:       Numbness, tingling bilat feet      Objective:    Physical Exam  BP 135/79 (BP Location: Right Arm, Patient Position: Sitting, Cuff Size: Large)   Pulse 100   Temp 98.3 F (36.8 C) (Temporal)   Ht '5\' 9"'  (1.753 m)   Wt 216 lb 12.8 oz (98.3 kg)   SpO2 98%   BMI 32.02 kg/m  Wt Readings from Last 3 Encounters:  05/25/19 216 lb 12.8 oz (98.3 kg)  03/07/19 212 lb (96.2 kg)  02/14/19 212 lb (96.2 kg)    Lab Results  Component Value Date   TSH 0.257 (L) 05/22/2019   Lab Results  Component Value Date    WBC 7.0 08/08/2018   HGB 13.0 08/08/2018   HCT 37.5 08/08/2018   MCV 90 08/08/2018   PLT 451 (H) 08/08/2018   Lab Results  Component Value Date   NA 139 05/22/2019   K 3.7 05/22/2019   CO2 20 05/22/2019   GLUCOSE 141 (H) 05/22/2019   BUN 21 05/22/2019   CREATININE 1.20 (H) 05/22/2019   BILITOT 0.3 05/22/2019   ALKPHOS 115 05/22/2019   AST 20 05/22/2019   ALT 17 05/22/2019   PROT 7.7 05/22/2019   ALBUMIN 4.4 05/22/2019   CALCIUM 10.0 05/22/2019   ANIONGAP 13 03/07/2019   Lab Results  Component Value Date   CHOL 179 05/22/2019   Lab Results  Component Value Date   HDL 66 05/22/2019   Lab Results  Component Value Date   LDLCALC 95 05/22/2019   Lab Results  Component Value Date   TRIG 99 05/22/2019   Lab Results  Component Value Date   CHOLHDL 2.7 11/06/2017   Lab Results  Component Value Date   HGBA1C 10.0 (H) 05/22/2019      Assessment & Plan:  1. Type 2 diabetes mellitus with hyperglycemia, with long-term current use of insulin (HCC) Diabetic shoes previously-LE edema bilat, self care of feet, No skin ulcer, eye surgery 1/21, A1c 10.0-sees Dr. Dorris Fetch on Monday for evaluation.  Pt currently taking Humalog 75/25, metformin 1000 mg BID-pt does not like taking metformin-pt would like to change medication to a different medication.  Pt takes Pravastatin-LDL 95-no leg cramps. Fasting  glucose157 - [facility] DIABETES FOOT EXAM Follow-up: keep appt with Dr. Dorris Fetch for regulation of medication for DM   Andrw Mcguirt Hannah Beat, MD

## 2019-05-27 ENCOUNTER — Other Ambulatory Visit: Payer: Self-pay | Admitting: Family Medicine

## 2019-05-29 ENCOUNTER — Ambulatory Visit (INDEPENDENT_AMBULATORY_CARE_PROVIDER_SITE_OTHER): Payer: Medicare Other | Admitting: "Endocrinology

## 2019-05-29 ENCOUNTER — Encounter: Payer: Self-pay | Admitting: "Endocrinology

## 2019-05-29 ENCOUNTER — Other Ambulatory Visit: Payer: Self-pay

## 2019-05-29 VITALS — BP 158/83 | HR 103 | Ht 69.0 in | Wt 218.2 lb

## 2019-05-29 DIAGNOSIS — E890 Postprocedural hypothyroidism: Secondary | ICD-10-CM

## 2019-05-29 DIAGNOSIS — E1165 Type 2 diabetes mellitus with hyperglycemia: Secondary | ICD-10-CM | POA: Diagnosis not present

## 2019-05-29 DIAGNOSIS — E1159 Type 2 diabetes mellitus with other circulatory complications: Secondary | ICD-10-CM | POA: Insufficient documentation

## 2019-05-29 DIAGNOSIS — E1169 Type 2 diabetes mellitus with other specified complication: Secondary | ICD-10-CM | POA: Insufficient documentation

## 2019-05-29 MED ORDER — GLIPIZIDE ER 5 MG PO TB24: 5.0000 mg | ORAL_TABLET | Freq: Every day | ORAL | 3 refills | Status: DC

## 2019-05-29 MED ORDER — LEVOTHYROXINE SODIUM 88 MCG PO TABS: ORAL_TABLET | ORAL | 1 refills | Status: DC

## 2019-05-29 NOTE — Progress Notes (Signed)
05/29/2019  Endocrinology consult note  Subjective:    Patient ID: Stacey Hanson, female    DOB: [birth year], PCP Fayrene Helper, MD   Past Medical History:  Diagnosis Date  . BMI 30.0-30.9,adult 2012 215 LBS   2004 198 LBS  . Diabetes mellitus   . 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 GERD (gastroesophageal reflux disease)   . H. pylori infection 09/24/2010  . Hyperlipidemia   . Hypertension   . Hypothyroidism   . 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 Knee pain   . Obesity    Past Surgical History:  Procedure Laterality Date  . ABDOMINAL HYSTERECTOMY  1970  . CATARACT EXTRACTION W/PHACO Left 03/09/2019   Procedure: CATARACT EXTRACTION PHACO AND INTRAOCULAR LENS PLACEMENT (IOC);  Surgeon: Baruch Goldmann, MD;  Location: [facility];  Service: Ophthalmology;  Laterality: Left;  CDE: 9.08  . CATARACT EXTRACTION W/PHACO Right 03/27/2019   Procedure: CATARACT EXTRACTION PHACO AND INTRAOCULAR LENS PLACEMENT RIGHT EYE;  Surgeon: Baruch Goldmann, MD;  Location: [facility];  Service: Ophthalmology;  Laterality: Right;  CDE: 7.47  . CHOLECYSTECTOMY  OCT 2010 BZ BILIARY DYSKINESIA   CHRONIC CHOLECYSTITIS  . COLONOSCOPY  MAY 2009 SCREENING   pTC TICS, SML IH  . COLONOSCOPY N/A 08/11/2017   Procedure: COLONOSCOPY;  Surgeon: Danie Binder, MD;  Location: [facility];  Service: Endoscopy;  Laterality: N/A;  9:30am  . ESOPHAGOGASTRODUODENOSCOPY  10/03/2010   Procedure: ESOPHAGOGASTRODUODENOSCOPY (EGD);  Surgeon: Dorothyann Peng, MD;  Location: [facility];  Service: Endoscopy;  Laterality: N/A;  . ESOPHAGOGASTRODUODENOSCOPY  12/19/2002   RMR: Normal esophagus/Normal stomach/ Duodenum large bulbar diverticulum, 1 cm bulbar ulcer with surrounding/ inflammation as described above. Normal D2  . POLYPECTOMY  08/11/2017   Procedure: POLYPECTOMY;  Surgeon: Danie Binder, MD;  Location: [facility];  Service: Endoscopy;;  cecal   . SAVORY DILATION  10/03/2010    Procedure: SAVORY DILATION;  Surgeon: Dorothyann Peng, MD;  Location: [facility];  Service: Endoscopy;;  . TOTAL ABDOMINAL HYSTERECTOMY  1970  . TOTAL KNEE ARTHROPLASTY Left 12/21/2017   Procedure: LEFT TOTAL KNEE ARTHROPLASTY;  Surgeon: Paralee Cancel, MD;  Location: [facility];  Service: Orthopedics;  Laterality: Left;  70 mins block  . UPPER GASTROINTESTINAL ENDOSCOPY  OCT 2004 RMR   DUO TIC, & ULCER   Social History   Socioeconomic History  . Marital status: Single    Spouse name: Not on file  . Number of children: 3  . Years of education: 10  . Highest education level: 10th grade  Occupational History  . Occupation: [employer]  . Occupation: Retired   Tobacco Use  . Smoking status: Former Smoker    Packs/day: 0.25    Years: 10.00    Pack years: 2.50    Types: Cigarettes    Quit date: 02/20/1985    Years since quitting: 34.2  . Smokeless tobacco: Never Used  Substance and Sexual Activity  . Alcohol use: No  . Drug use: No  . Sexual activity: Not Currently  Other Topics Concern  . Not on file  Social History Narrative   Lives alone    Social Determinants of Health   Financial Resource Strain:   . Difficulty of Paying Living Expenses:   Food Insecurity:   . Worried About Charity fundraiser in the Last Year:   . [place] in the Last Year:  Transportation Needs:   . Film/video editor (Medical):   Marland Kitchen Lack of Transportation (Non-Medical):   Physical Activity:   . Days of Exercise per Week:   . Minutes of Exercise per Session:   Stress:   . Feeling of Stress :   Social Connections:   . Frequency of Communication with Friends and Family:   . Frequency of Social Gatherings with Friends and Family:   . Attends Religious Services:   . Active Member of Clubs or Organizations:   . Attends Archivist Meetings:   Marland Kitchen Marital Status:    Outpatient Encounter Medications as of 05/29/2019  Medication Sig  . aspirin EC 81 MG tablet Take 81 mg by mouth  daily.  . B-D UF III MINI PEN NEEDLES 31G X 5 MM MISC USE FOR TWICE DAILY INJECTIONS OF HUMALOG  . benazepril (LOTENSIN) 40 MG tablet TAKE 1 TABLET(40 MG) BY MOUTH DAILY  . Blood Glucose Monitoring Suppl (ONE TOUCH ULTRA 2) w/Device KIT Twice daily testing dx e11.65  . Calcium Carb-Cholecalciferol (CALCIUM 600/VITAMIN D3 PO) Take 2 tablets by mouth daily.  Marland Kitchen glipiZIDE (GLUCOTROL XL) 5 MG 24 hr tablet Take 1 tablet (5 mg total) by mouth daily with breakfast.  . hydrochlorothiazide (HYDRODIURIL) 25 MG tablet TAKE 1 TABLET(25 MG) BY MOUTH DAILY  . Insulin Lispro Prot & Lispro (HUMALOG MIX 75/25 KWIKPEN) (75-25) 100 UNIT/ML Kwikpen ADMINISTER 40 UNITS UNDER THE SKIN TWICE DAILY (Patient taking differently: Inject 40 Units into the skin 2 (two) times daily. ADMINISTER 40 UNITS UNDER THE SKIN TWICE DAILY)  . Lancets (ONETOUCH DELICA PLUS [id number]) MISC USE 1 TO CHECK GLUCOSE TWICE DAILY AS DIRECTED  . levothyroxine (SYNTHROID) 88 MCG tablet TAKE 1 TABLET(88 MCG) BY MOUTH DAILY BEFORE BREAKFAST  . metFORMIN (GLUCOPHAGE) 1000 MG tablet TAKE 1 TABLET BY MOUTH TWICE DAILY WITH A MEAL  . Multiple Vitamin (MULTIVITAMIN) capsule Take 1 capsule by mouth daily.    Marland Kitchen omeprazole (PRILOSEC) 20 MG capsule TAKE 1 CAPSULE BY MOUTH EVERY MORNING (Patient taking differently: Take 20 mg by mouth every morning. )  . ONETOUCH ULTRA test strip USE 1 STRIP TO CHECK GLUCOSE TWICE DAILY  . pravastatin (PRAVACHOL) 40 MG tablet Take 1 tablet (40 mg total) by mouth daily.  Marland Kitchen UNABLE TO FIND Diabetic shoes x 1 pair, Inserts x 3 pair DX E11.9  . [DISCONTINUED] ibuprofen (ADVIL,MOTRIN) 600 MG tablet Take one tablet once daily, as needed, for  Pain. Maximum 3 tablets per week (Patient taking differently: Take 600 mg by mouth every 8 (eight) hours as needed for moderate pain. )  . [DISCONTINUED] levothyroxine (SYNTHROID) 88 MCG tablet TAKE 1 TABLET(88 MCG) BY MOUTH DAILY BEFORE BREAKFAST   No facility-administered encounter  medications on file as of 05/29/2019.   ALLERGIES: Allergies  Allergen Reactions  . Simvastatin Other (See Comments)    Muscle cramps   VACCINATION STATUS: Immunization History  Administered Date(s) Administered  . Fluad Quad(high Dose 65+) 11/23/2018  . Influenza,inj,Quad PF,6+ Mos 11/16/2012, 11/15/2013, 11/14/2014, 11/14/2015, 11/03/2016, 11/09/2017  . Influenza,inj,quad, With Preservative 11/02/2016  . Pneumococcal Conjugate-13 10/11/2013  . Pneumococcal Polysaccharide-23 10/07/2011  . Zoster 04/05/2006    Hypertension The current episode started more than 1 year ago. The problem is uncontrolled. Pertinent negatives include no chest pain, headaches, palpitations or shortness of breath. Risk factors for coronary artery disease include diabetes mellitus, dyslipidemia, obesity, sedentary lifestyle and smoking/tobacco exposure. Past treatments include ACE inhibitors.  Diabetes She presents for her initial diabetic visit. She  has type 2 diabetes mellitus. Onset time: She was diagnosed at approximate age of 71 years. Her disease course has been worsening. There are no hypoglycemic associated symptoms. Pertinent negatives for hypoglycemia include no confusion, headaches, pallor or seizures. Associated symptoms include polydipsia and polyuria. Pertinent negatives for diabetes include no chest pain, no fatigue and no polyphagia. There are no hypoglycemic complications. Symptoms are worsening. Risk factors for coronary artery disease include dyslipidemia, diabetes mellitus, obesity, hypertension, sedentary lifestyle, post-menopausal and tobacco exposure. Current diabetic treatment includes insulin injections. Her weight is increasing steadily. She is following a generally unhealthy diet. When asked about meal planning, she reported none. Her home blood glucose trend is increasing steadily. (She did not bring any logs nor meter to review.  Her recent A1c was 10%. ) An ACE inhibitor/angiotensin II  receptor blocker is being taken. Eye exam is current.  -She is also status post radioactive iodine treatment for toxic nodule on October 17, 2010.  She has had  history of multinodular goiter status post fine-needle aspiration with benign findings. 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  She is currently on levothyroxine 88 mcg p.o. nightly.  She reports compliance to her medication.  She denies any new complaints today.   She denies heat intolerance/cold intolerance. -She is referred this time for management of her diabetes.  Review of Systems  Constitutional: Negative for chills, fatigue, fever and unexpected weight change.  HENT: Negative for trouble swallowing and voice change.   Eyes: Negative for visual disturbance.  Respiratory: Negative for cough, shortness of breath and wheezing.   Cardiovascular: Negative for chest pain, palpitations and leg swelling.  Gastrointestinal: Negative for diarrhea, nausea and vomiting.  Endocrine: Positive for polydipsia and polyuria. Negative for cold intolerance, heat intolerance and polyphagia.  Musculoskeletal: Negative for arthralgias and myalgias.  Skin: Negative for color change, pallor, rash and wound.  Neurological: Negative for seizures and headaches.  Psychiatric/Behavioral: Negative for confusion and suicidal ideas.    Objective:    BP (!) 158/83   Pulse (!) 103   Ht '5\' 9"'  (1.753 m)   Wt 218 lb 3.2 oz (99 kg)   BMI 32.22 kg/m   Wt Readings from Last 3 Encounters:  05/29/19 218 lb 3.2 oz (99 kg)  05/25/19 216 lb 12.8 oz (98.3 kg)  03/07/19 212 lb (96.2 kg)    Physical Exam  Constitutional: She is oriented to person, place, and time. She appears well-developed.  HENT:  Head: Normocephalic and atraumatic.  Eyes: EOM are normal.  Neck: No tracheal deviation present. No thyromegaly present.  Cardiovascular: Normal rate and regular rhythm.  Pulmonary/Chest: Effort normal.  Abdominal: Soft. There is no  abdominal tenderness. There is no guarding.  Musculoskeletal:        General: No edema. Normal range of motion.     Cervical back: Normal range of motion and neck supple.  Neurological: She is alert and oriented to person, place, and time. She has normal reflexes. No cranial nerve deficit. Coordination normal.  Skin: Skin is warm and dry. No rash noted. No erythema. No pallor.  Psychiatric: She has a normal mood and affect. Judgment normal.    CMP     Component Value Date/Time   NA 139 05/22/2019 0941   K 3.7 05/22/2019 0941   CL 98 05/22/2019 0941   CO2 20 05/22/2019 0941   GLUCOSE 141 (H) 05/22/2019 0941   GLUCOSE 412 (H) 03/07/2019 0940   BUN 21 05/22/2019 0941  CREATININE 1.20 (H) 05/22/2019 0941   CREATININE 0.96 (H) 03/12/2016 0925   CALCIUM 10.0 05/22/2019 0941   PROT 7.7 05/22/2019 0941   ALBUMIN 4.4 05/22/2019 0941   AST 20 05/22/2019 0941   ALT 17 05/22/2019 0941   ALKPHOS 115 05/22/2019 0941   BILITOT 0.3 05/22/2019 0941   GFRNONAA 45 (L) 05/22/2019 0941   GFRNONAA 60 03/12/2016 0925   GFRAA 52 (L) 05/22/2019 0941   GFRAA 69 03/12/2016 0925     Diabetic Labs (most recent): Lab Results  Component Value Date   HGBA1C 10.0 (H) 05/22/2019   HGBA1C 9.8 (H) 03/07/2019   HGBA1C 7.7 (H) 11/09/2018     Lipid Panel ( most recent) Lipid Panel     Component Value Date/Time   CHOL 179 05/22/2019 0941   TRIG 99 05/22/2019 0941   HDL 66 05/22/2019 0941   CHOLHDL 2.7 11/06/2017 0947   CHOLHDL 2.2 01/11/2016 0916   VLDL 11 01/11/2016 0916   LDLCALC 95 05/22/2019 0941    Assessment & Plan:   1. Uncontrolled type 2 diabetes mellitus with stage 3 chronic kidney disease, with long-term current use of insulin ([place])  -  She is being referred for the management of her diabetes this time.  She will continue to need insulin treatment in order for her to achieve control of diabetes to target.    I have told her to start monitoring blood glucose 4 times a day-daily  before meals and at bedtime.  She is advised to continue her Humalog 75/25 to 45 units with breakfast and 45 units with supper for premeal blood glucose readings above 90 mg per DL.   - she  admits there is a room for improvement in her diet and drink choices. -  Suggestion is made for her to avoid simple carbohydrates  from her diet including Cakes, Sweet Desserts / Pastries, Ice Cream, Soda (diet and regular), Sweet Tea, Candies, Chips, Cookies, Sweet Pastries,  Store Bought Juices, Alcohol in Excess of  1-2 drinks a day, Artificial Sweeteners, Coffee Creamer, and "Sugar-free" Products. This will help patient to have stable blood glucose profile and potentially avoid unintended weight gain.  She is advised to lower Metformin to 500 mg p.o. twice daily due to CKD.    -She will benefit from glipizide therapy.  I discussed and added glipizide 5 mg XL p.o. daily at breakfast.   2. Hypothyroidism, postradioiodine therapy -Her previsit thyroid function tests are excellent she will benefit from her current dose of levothyroxine.  She is advised to continue levothyroxine 88 mcg daily before breakfast.      - We discussed about the correct intake of her thyroid hormone, on empty stomach at fasting, with water, separated by at least 30 minutes from breakfast and other medications,  and separated by more than 4 hours from calcium, iron, multivitamins, acid reflux medications (PPIs). -Patient is made aware of the fact that thyroid hormone replacement is needed for life, dose to be adjusted by periodic monitoring of thyroid function tests.    - I advised patient to maintain close follow up with Fayrene Helper, MD for diabetes and all other primary care needs.   - Time spent on this patient care encounter:  45 min, of which > 50% was spent in  counseling and the rest reviewing her blood glucose logs , discussing her hypoglycemia and hyperglycemia episodes, reviewing her current and  previous labs /  studies  ( including abstraction from other facilities) and  medications  doses and developing a  long term treatment plan and documenting her care.   Please refer to Patient Instructions for Blood Glucose Monitoring and Insulin/Medications Dosing Guide"  in media tab for additional information. Please  also refer to " Patient Self Inventory" in the Media  tab for reviewed elements of pertinent patient history.  Stacey Hanson participated in the discussions, expressed understanding, and voiced agreement with the above plans.  All questions were answered to her satisfaction. she is encouraged to contact clinic should she have any questions or concerns prior to her return visit.  Follow up plan: Return in about 2 weeks (around 06/12/2019) for Follow up with Meter and Logs Only - no Labs.  Glade Lloyd, MD Phone: [phone]  Fax: [phone]  -  This note was partially dictated with voice recognition software. Similar sounding words can be transcribed inadequately or may not  be corrected upon review.  05/29/2019, 7:32 PM

## 2019-05-29 NOTE — Patient Instructions (Signed)

## 2019-06-14 ENCOUNTER — Ambulatory Visit (INDEPENDENT_AMBULATORY_CARE_PROVIDER_SITE_OTHER): Payer: Medicare Other | Admitting: "Endocrinology

## 2019-06-14 ENCOUNTER — Encounter: Payer: Self-pay | Admitting: Gastroenterology

## 2019-06-14 ENCOUNTER — Other Ambulatory Visit: Payer: Self-pay

## 2019-06-14 ENCOUNTER — Encounter: Payer: Self-pay | Admitting: "Endocrinology

## 2019-06-14 VITALS — BP 138/86 | HR 109 | Ht 69.0 in | Wt 217.6 lb

## 2019-06-14 DIAGNOSIS — E1165 Type 2 diabetes mellitus with hyperglycemia: Secondary | ICD-10-CM | POA: Diagnosis not present

## 2019-06-14 DIAGNOSIS — E890 Postprocedural hypothyroidism: Secondary | ICD-10-CM

## 2019-06-14 MED ORDER — INSULIN LISPRO PROT & LISPRO (75-25 MIX) 100 UNIT/ML KWIKPEN: PEN_INJECTOR | SUBCUTANEOUS | 4 refills | Status: DC

## 2019-06-14 NOTE — Patient Instructions (Signed)

## 2019-06-14 NOTE — Progress Notes (Signed)
06/14/2019  Endocrinology consult note  Subjective:    Patient ID: Stacey Hanson, female    DOB: 1945-09-27, PCP Fayrene Helper, MD   Past Medical History:  Diagnosis Date  . BMI 30.0-30.9,adult 2012 215 LBS   2004 198 LBS  . Diabetes mellitus   . 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 GERD (gastroesophageal reflux disease)   . H. pylori infection 09/24/2010  . Hyperlipidemia   . Hypertension   . Hypothyroidism   . 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 Knee pain   . Obesity    Past Surgical History:  Procedure Laterality Date  . ABDOMINAL HYSTERECTOMY  1970  . CATARACT EXTRACTION W/PHACO Left 03/09/2019   Procedure: CATARACT EXTRACTION PHACO AND INTRAOCULAR LENS PLACEMENT (IOC);  Surgeon: Baruch Goldmann, MD;  Location: [facility];  Service: Ophthalmology;  Laterality: Left;  CDE: 9.08  . CATARACT EXTRACTION W/PHACO Right 03/27/2019   Procedure: CATARACT EXTRACTION PHACO AND INTRAOCULAR LENS PLACEMENT RIGHT EYE;  Surgeon: Baruch Goldmann, MD;  Location: [facility];  Service: Ophthalmology;  Laterality: Right;  CDE: 7.47  . CHOLECYSTECTOMY  OCT 2010 BZ BILIARY DYSKINESIA   CHRONIC CHOLECYSTITIS  . COLONOSCOPY  MAY 2009 SCREENING   pTC TICS, SML IH  . COLONOSCOPY N/A 08/11/2017   Procedure: COLONOSCOPY;  Surgeon: Danie Binder, MD;  Location: [facility];  Service: Endoscopy;  Laterality: N/A;  9:30am  . ESOPHAGOGASTRODUODENOSCOPY  10/03/2010   Procedure: ESOPHAGOGASTRODUODENOSCOPY (EGD);  Surgeon: Dorothyann Peng, MD;  Location: [facility];  Service: Endoscopy;  Laterality: N/A;  . ESOPHAGOGASTRODUODENOSCOPY  12/19/2002   RMR: Normal esophagus/Normal stomach/ Duodenum large bulbar diverticulum, 1 cm bulbar ulcer with surrounding/ inflammation as described above. Normal D2  . POLYPECTOMY  08/11/2017   Procedure: POLYPECTOMY;  Surgeon: Danie Binder, MD;  Location: [facility];  Service: Endoscopy;;  cecal   . SAVORY DILATION  10/03/2010    Procedure: SAVORY DILATION;  Surgeon: Dorothyann Peng, MD;  Location: [facility];  Service: Endoscopy;;  . TOTAL ABDOMINAL HYSTERECTOMY  1970  . TOTAL KNEE ARTHROPLASTY Left 12/21/2017   Procedure: LEFT TOTAL KNEE ARTHROPLASTY;  Surgeon: Paralee Cancel, MD;  Location: [facility];  Service: Orthopedics;  Laterality: Left;  70 mins block  . UPPER GASTROINTESTINAL ENDOSCOPY  OCT 2004 RMR   DUO TIC, & ULCER   Social History   Socioeconomic History  . Marital status: Single    Spouse name: Not on file  . Number of children: 3  . Years of education: 10  . Highest education level: 10th grade  Occupational History  . Occupation: [employer]  . Occupation: Retired   Tobacco Use  . Smoking status: Former Smoker    Packs/day: 0.25    Years: 10.00    Pack years: 2.50    Types: Cigarettes    Quit date: 02/20/1985    Years since quitting: 34.3  . Smokeless tobacco: Never Used  Substance and Sexual Activity  . Alcohol use: No  . Drug use: No  . Sexual activity: Not Currently  Other Topics Concern  . Not on file  Social History Narrative   Lives alone    Social Determinants of Health   Financial Resource Strain:   . Difficulty of Paying Living Expenses:   Food Insecurity:   . Worried About Charity fundraiser in the Last Year:   . [place] in the Last Year:  Transportation Needs:   . Film/video editor (Medical):   Marland Kitchen Lack of Transportation (Non-Medical):   Physical Activity:   . Days of Exercise per Week:   . Minutes of Exercise per Session:   Stress:   . Feeling of Stress :   Social Connections:   . Frequency of Communication with Friends and Family:   . Frequency of Social Gatherings with Friends and Family:   . Attends Religious Services:   . Active Member of Clubs or Organizations:   . Attends Archivist Meetings:   Marland Kitchen Marital Status:    Outpatient Encounter Medications as of 06/14/2019  Medication Sig  . aspirin EC 81 MG tablet Take 81 mg by mouth  daily.  . B-D UF III MINI PEN NEEDLES 31G X 5 MM MISC USE FOR TWICE DAILY INJECTIONS OF HUMALOG  . benazepril (LOTENSIN) 40 MG tablet TAKE 1 TABLET(40 MG) BY MOUTH DAILY  . Blood Glucose Monitoring Suppl (ONE TOUCH ULTRA 2) w/Device KIT Twice daily testing dx e11.65  . Calcium Carb-Cholecalciferol (CALCIUM 600/VITAMIN D3 PO) Take 2 tablets by mouth daily.  Marland Kitchen glipiZIDE (GLUCOTROL XL) 5 MG 24 hr tablet Take 1 tablet (5 mg total) by mouth daily with breakfast.  . hydrochlorothiazide (HYDRODIURIL) 25 MG tablet TAKE 1 TABLET(25 MG) BY MOUTH DAILY  . Insulin Lispro Prot & Lispro (HUMALOG MIX 75/25 KWIKPEN) (75-25) 100 UNIT/ML Kwikpen ADMINISTER 50 units with breakfast and 45 units with supper when glucose is above 51m/dl.  . Lancets (ONETOUCH DELICA PLUS [id number] MISC USE 1 TO CHECK GLUCOSE TWICE DAILY AS DIRECTED  . levothyroxine (SYNTHROID) 88 MCG tablet TAKE 1 TABLET(88 MCG) BY MOUTH DAILY BEFORE BREAKFAST  . metFORMIN (GLUCOPHAGE) 1000 MG tablet TAKE 1 TABLET BY MOUTH TWICE DAILY WITH A MEAL  . Multiple Vitamin (MULTIVITAMIN) capsule Take 1 capsule by mouth daily.    .Marland Kitchenomeprazole (PRILOSEC) 20 MG capsule TAKE 1 CAPSULE BY MOUTH EVERY MORNING (Patient taking differently: Take 20 mg by mouth every morning. )  . ONETOUCH ULTRA test strip USE 1 STRIP TO CHECK GLUCOSE TWICE DAILY  . pravastatin (PRAVACHOL) 40 MG tablet Take 1 tablet (40 mg total) by mouth daily.  .Marland KitchenUNABLE TO FIND Diabetic shoes x 1 pair, Inserts x 3 pair DX E11.9  . [DISCONTINUED] Insulin Lispro Prot & Lispro (HUMALOG MIX 75/25 KWIKPEN) (75-25) 100 UNIT/ML Kwikpen ADMINISTER 40 UNITS UNDER THE SKIN TWICE DAILY (Patient taking differently: Inject 40 Units into the skin 2 (two) times daily. ADMINISTER 40 UNITS UNDER THE SKIN TWICE DAILY)   No facility-administered encounter medications on file as of 06/14/2019.   ALLERGIES: Allergies  Allergen Reactions  . Simvastatin Other (See Comments)    Muscle cramps   VACCINATION  STATUS: Immunization History  Administered Date(s) Administered  . Fluad Quad(high Dose 65+) 11/23/2018  . Influenza,inj,Quad PF,6+ Mos 11/16/2012, 11/15/2013, 11/14/2014, 11/14/2015, 11/03/2016, 11/09/2017  . Influenza,inj,quad, With Preservative 11/02/2016  . Pneumococcal Conjugate-13 10/11/2013  . Pneumococcal Polysaccharide-23 10/07/2011  . Zoster 04/05/2006    Hypertension The current episode started more than 1 year ago. The problem is uncontrolled. Pertinent negatives include no chest pain, headaches, palpitations or shortness of breath. Risk factors for coronary artery disease include diabetes mellitus, dyslipidemia, obesity, sedentary lifestyle and smoking/tobacco exposure. Past treatments include ACE inhibitors.  Diabetes She presents for her follow-up diabetic visit. She has type 2 diabetes mellitus. Onset time: She was diagnosed at approximate age of 89years. Her disease course has been worsening. There are no hypoglycemic associated  symptoms. Pertinent negatives for hypoglycemia include no confusion, headaches, pallor or seizures. Associated symptoms include polydipsia and polyuria. Pertinent negatives for diabetes include no chest pain, no fatigue and no polyphagia. There are no hypoglycemic complications. Symptoms are improving. Risk factors for coronary artery disease include dyslipidemia, diabetes mellitus, obesity, hypertension, sedentary lifestyle, post-menopausal and tobacco exposure. Current diabetic treatment includes insulin injections. Her weight is increasing steadily. She is following a generally unhealthy diet. When asked about meal planning, she reported none. Her home blood glucose trend is increasing steadily. Her breakfast blood glucose range is generally 130-140 mg/dl. Her lunch blood glucose range is generally 140-180 mg/dl. Her dinner blood glucose range is generally 140-180 mg/dl. Her bedtime blood glucose range is generally 140-180 mg/dl. Her overall blood glucose  range is 140-180 mg/dl. (She presents with better glycemic profile, near target fasting,  slightly above target postprandial blood glucose readings.  Her recent A1c was 10%.  She did not document or report any hypoglycemia.     ) An ACE inhibitor/angiotensin II receptor blocker is being taken. Eye exam is current.  -She is also status post radioactive iodine treatment for toxic nodule on October 17, 2010.  She has had  history of multinodular goiter status post fine-needle aspiration with benign findings. 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  She is currently on levothyroxine 88 mcg p.o. nightly.  She reports compliance to her medication.  She denies any new complaints today.   She denies heat intolerance/cold intolerance. -She is referred this time for management of her diabetes.  Review of Systems  Constitutional: Negative for chills, fatigue, fever and unexpected weight change.  HENT: Negative for trouble swallowing and voice change.   Eyes: Negative for visual disturbance.  Respiratory: Negative for cough, shortness of breath and wheezing.   Cardiovascular: Negative for chest pain, palpitations and leg swelling.  Gastrointestinal: Negative for diarrhea, nausea and vomiting.  Endocrine: Positive for polydipsia and polyuria. Negative for cold intolerance, heat intolerance and polyphagia.  Musculoskeletal: Negative for arthralgias and myalgias.  Skin: Negative for color change, pallor, rash and wound.  Neurological: Negative for seizures and headaches.  Psychiatric/Behavioral: Negative for confusion and suicidal ideas.    Objective:    BP 138/86   Pulse (!) 109   Ht '5\' 9"'  (1.753 m)   Wt 217 lb 9.6 oz (98.7 kg)   BMI 32.13 kg/m   Wt Readings from Last 3 Encounters:  06/14/19 217 lb 9.6 oz (98.7 kg)  05/29/19 218 lb 3.2 oz (99 kg)  05/25/19 216 lb 12.8 oz (98.3 kg)      Physical Exam- Limited  Constitutional:  Body mass index is 32.13 kg/m. , not  in acute distress, normal state of mind Eyes:  EOMI, no exophthalmos Neck: Supple Thyroid: No gross goiter Respiratory: Adequate breathing efforts Musculoskeletal: no gross deformities, strength intact in all four extremities, no gross restriction of joint movements Skin:  no rashes, no hyperemia Neurological: no tremor with outstretched hands,    CMP     Component Value Date/Time   NA 139 05/22/2019 0941   K 3.7 05/22/2019 0941   CL 98 05/22/2019 0941   CO2 20 05/22/2019 0941   GLUCOSE 141 (H) 05/22/2019 0941   GLUCOSE 412 (H) 03/07/2019 0940   BUN 21 05/22/2019 0941   CREATININE 1.20 (H) 05/22/2019 0941   CREATININE 0.96 (H) 03/12/2016 0925   CALCIUM 10.0 05/22/2019 0941   PROT 7.7 05/22/2019 0941   ALBUMIN 4.4 05/22/2019  0941   AST 20 05/22/2019 0941   ALT 17 05/22/2019 0941   ALKPHOS 115 05/22/2019 0941   BILITOT 0.3 05/22/2019 0941   GFRNONAA 45 (L) 05/22/2019 0941   GFRNONAA 60 03/12/2016 0925   GFRAA 52 (L) 05/22/2019 0941   GFRAA 69 03/12/2016 0925     Diabetic Labs (most recent): Lab Results  Component Value Date   HGBA1C 10.0 (H) 05/22/2019   HGBA1C 9.8 (H) 03/07/2019   HGBA1C 7.7 (H) 11/09/2018     Lipid Panel ( most recent) Lipid Panel     Component Value Date/Time   CHOL 179 05/22/2019 0941   TRIG 99 05/22/2019 0941   HDL 66 05/22/2019 0941   CHOLHDL 2.7 11/06/2017 0947   CHOLHDL 2.2 01/11/2016 0916   VLDL 11 01/11/2016 0916   LDLCALC 95 05/22/2019 0941    Assessment & Plan:   1. Uncontrolled type 2 diabetes mellitus with stage 3 chronic kidney disease, with long-term current use of insulin ([facility])  -  She is being referred for the management of her diabetes this time.  She will continue to need insulin treatment in order for her to achieve control of diabetes to target.    She presents with better glycemic profile, near target fasting,  slightly above target postprandial blood glucose readings.  Her recent A1c was 10%.  She did not document  or report any hypoglycemia.    - she  admits there is a room for improvement in her diet and drink choices. -  Suggestion is made for her to avoid simple carbohydrates  from her diet including Cakes, Sweet Desserts / Pastries, Ice Cream, Soda (diet and regular), Sweet Tea, Candies, Chips, Cookies, Sweet Pastries,  Store Bought Juices, Alcohol in Excess of  1-2 drinks a day, Artificial Sweeteners, Coffee Creamer, and "Sugar-free" Products. This will help patient to have stable blood glucose profile and potentially avoid unintended weight gain. She is advised to increase her Humalog 75/25 to 50 units at breakfast and 45 units at supper associated with monitoring of blood glucose at least 2 times a day-daily before breakfast and before supper, and at any other time as needed.  She is advised to lower Metformin to 500 mg p.o. twice daily due to CKD.    -She has benefited from low-dose glipizide.  She is advised to continue  glipizide 5 mg XL p.o. daily at breakfast.   2. Hypothyroidism, postradioiodine therapy -Her previsit thyroid function tests are excellent she will benefit from her current dose of levothyroxine.  She is advised to continue levothyroxine 88 mcg daily before breakfast.     - We discussed about the correct intake of her thyroid hormone, on empty stomach at fasting, with water, separated by at least 30 minutes from breakfast and other medications,  and separated by more than 4 hours from calcium, iron, multivitamins, acid reflux medications (PPIs). -Patient is made aware of the fact that thyroid hormone replacement is needed for life, dose to be adjusted by periodic monitoring of thyroid function tests.    - I advised patient to maintain close follow up with Fayrene Helper, MD for diabetes and all other primary care needs.   - Time spent on this patient care encounter:  35 min, of which > 50% was spent in  counseling and the rest reviewing her blood glucose logs , discussing  her hypoglycemia and hyperglycemia episodes, reviewing her current and  previous labs / studies  ( including abstraction from other facilities) and  medications  doses and developing a  long term treatment plan and documenting her care.   Please refer to Patient Instructions for Blood Glucose Monitoring and Insulin/Medications Dosing Guide"  in media tab for additional information. Please  also refer to " Patient Self Inventory" in the Media  tab for reviewed elements of pertinent patient history.  Stacey Hanson participated in the discussions, expressed understanding, and voiced agreement with the above plans.  All questions were answered to her satisfaction. she is encouraged to contact clinic should she have any questions or concerns prior to her return visit.  Follow up plan: Return in about 3 months (around 09/13/2019) for Bring Meter and Logs- A1c in Office, Follow up with Pre-visit Labs.  Glade Lloyd, MD Phone: [phone]  Fax: [phone]  -  This note was partially dictated with voice recognition software. Similar sounding words can be transcribed inadequately or may not  be corrected upon review.  06/14/2019, 2:30 PM

## 2019-07-05 ENCOUNTER — Other Ambulatory Visit (HOSPITAL_COMMUNITY): Payer: Self-pay | Admitting: Family Medicine

## 2019-07-05 DIAGNOSIS — Z1231 Encounter for screening mammogram for malignant neoplasm of breast: Secondary | ICD-10-CM

## 2019-07-06 ENCOUNTER — Other Ambulatory Visit: Payer: Self-pay | Admitting: Family Medicine

## 2019-07-13 DIAGNOSIS — D210 Benign neoplasm of connective and other soft tissue of head, face and neck: Secondary | ICD-10-CM | POA: Diagnosis not present

## 2019-07-27 ENCOUNTER — Other Ambulatory Visit: Payer: Self-pay | Admitting: Family Medicine

## 2019-08-10 ENCOUNTER — Other Ambulatory Visit: Payer: Self-pay | Admitting: Family Medicine

## 2019-08-14 ENCOUNTER — Ambulatory Visit (HOSPITAL_COMMUNITY)
Admission: RE | Admit: 2019-08-14 | Discharge: 2019-08-14 | Disposition: A | Discharge status: Home / Self Care | Payer: Medicare Other | Source: Ambulatory Visit | Attending: Family Medicine | Admitting: Family Medicine

## 2019-08-14 ENCOUNTER — Other Ambulatory Visit: Payer: Self-pay

## 2019-08-14 DIAGNOSIS — Z1231 Encounter for screening mammogram for malignant neoplasm of breast: Secondary | ICD-10-CM

## 2019-08-24 DIAGNOSIS — D210 Benign neoplasm of connective and other soft tissue of head, face and neck: Secondary | ICD-10-CM | POA: Diagnosis not present

## 2019-08-24 DIAGNOSIS — B078 Other viral warts: Secondary | ICD-10-CM | POA: Diagnosis not present

## 2019-09-06 ENCOUNTER — Other Ambulatory Visit: Payer: Self-pay | Admitting: "Endocrinology

## 2019-09-06 DIAGNOSIS — E890 Postprocedural hypothyroidism: Secondary | ICD-10-CM | POA: Diagnosis not present

## 2019-09-06 DIAGNOSIS — E1165 Type 2 diabetes mellitus with hyperglycemia: Secondary | ICD-10-CM | POA: Diagnosis not present

## 2019-09-07 LAB — COMPREHENSIVE METABOLIC PANEL
ALT: 19 IU/L (ref 0–32)
AST: 20 IU/L (ref 0–40)
Albumin/Globulin Ratio: 1.4 (ref 1.2–2.2)
Albumin: 4.6 g/dL (ref 3.7–4.7)
Alkaline Phosphatase: 114 IU/L (ref 48–121)
BUN/Creatinine Ratio: 24 (ref 12–28)
BUN: 29 mg/dL — ABNORMAL HIGH (ref 8–27)
Bilirubin Total: 0.3 mg/dL (ref 0.0–1.2)
CO2: 19 mmol/L — ABNORMAL LOW (ref 20–29)
Calcium: 9.6 mg/dL (ref 8.7–10.3)
Chloride: 95 mmol/L — ABNORMAL LOW (ref 96–106)
Creatinine, Ser: 1.21 mg/dL — ABNORMAL HIGH (ref 0.57–1.00)
GFR calc Af Amer: 51 mL/min/{1.73_m2} — ABNORMAL LOW (ref >59)
GFR calc non Af Amer: 44 mL/min/{1.73_m2} — ABNORMAL LOW (ref >59)
Globulin, Total: 3.2 g/dL (ref 1.5–4.5)
Glucose: 318 mg/dL — ABNORMAL HIGH (ref 65–99)
Potassium: 4.6 mmol/L (ref 3.5–5.2)
Sodium: 133 mmol/L — ABNORMAL LOW (ref 134–144)
Total Protein: 7.8 g/dL (ref 6.0–8.5)

## 2019-09-07 LAB — SPECIMEN STATUS REPORT

## 2019-09-07 LAB — TSH: TSH: 2.39 u[IU]/mL (ref 0.450–4.500)

## 2019-09-07 LAB — T4, FREE: Free T4: 1.38 ng/dL (ref 0.82–1.77)

## 2019-09-09 ENCOUNTER — Other Ambulatory Visit: Payer: Self-pay | Admitting: Family Medicine

## 2019-09-14 ENCOUNTER — Ambulatory Visit (INDEPENDENT_AMBULATORY_CARE_PROVIDER_SITE_OTHER): Payer: Medicare Other | Admitting: Family Medicine

## 2019-09-14 ENCOUNTER — Other Ambulatory Visit: Payer: Self-pay

## 2019-09-14 ENCOUNTER — Encounter: Payer: Self-pay | Admitting: Family Medicine

## 2019-09-14 VITALS — BP 130/72 | HR 98 | Ht 69.0 in | Wt 218.0 lb

## 2019-09-14 DIAGNOSIS — E1165 Type 2 diabetes mellitus with hyperglycemia: Secondary | ICD-10-CM

## 2019-09-14 DIAGNOSIS — E6609 Other obesity due to excess calories: Secondary | ICD-10-CM

## 2019-09-14 DIAGNOSIS — M5410 Radiculopathy, site unspecified: Secondary | ICD-10-CM | POA: Diagnosis not present

## 2019-09-14 DIAGNOSIS — E782 Mixed hyperlipidemia: Secondary | ICD-10-CM | POA: Diagnosis not present

## 2019-09-14 DIAGNOSIS — Z6831 Body mass index (BMI) 31.0-31.9, adult: Secondary | ICD-10-CM

## 2019-09-14 DIAGNOSIS — I10 Essential (primary) hypertension: Secondary | ICD-10-CM

## 2019-09-14 DIAGNOSIS — E890 Postprocedural hypothyroidism: Secondary | ICD-10-CM

## 2019-09-14 MED ORDER — GABAPENTIN 100 MG PO CAPS: ORAL_CAPSULE | ORAL | 1 refills | Status: DC

## 2019-09-14 MED ORDER — LIDOCAINE 5 % EX PTCH: 1.0000 | MEDICATED_PATCH | CUTANEOUS | 0 refills | Status: DC

## 2019-09-14 NOTE — Patient Instructions (Addendum)
Annual  Physical exam in office with mD early October , call  If you need me sooner  New for sciatica is Lidoderm patch  and gabapentin, you are also referred to physical therapy  You may take tylenol one tablet twice daily  Careful not to fall  Hope you feel better soon  Thanks for choosing [facility], we consider it a privelige to serve you.

## 2019-09-14 NOTE — Assessment & Plan Note (Addendum)
tylenol extra strength twice dailuy a, lidoderm patch and gabapentin at bedtime , also PT x 1 month. Fall precautions discussed

## 2019-09-14 NOTE — Progress Notes (Signed)
Stacey Hanson     MRN: [id number]      DOB: 09-May-1945   HPI Stacey Hanson is here for follow up and re-evaluation of chronic medical conditions, medication management and review of any available recent lab and radiology data.  Preventive health is updated, specifically  Cancer screening and Immunization.   Questions or concerns regarding consultations or procedures which the PT has had in the interim are  addressed. The PT denies any adverse reactions to current medications since the last visit.  1 month h/o LBP radiating to left ankle x 1 month  Blood sugars remain uncontrolled states FBG averages 200. No inciting trauma and has known DJD spine with disc disease Denies any new weakness, loss of sennsation or incontinence  ROS Denies recent fever or chills. Denies sinus pressure, nasal congestion, ear pain or sore throat. Denies chest congestion, productive cough or wheezing. Denies chest pains, palpitations and leg swelling Denies abdominal pain, nausea, vomiting,diarrhea or constipation.   Denies dysuria, frequency, hesitancy or incontinence.  Denies headaches, seizures, numbness, or tingling. Stressed due to current poor health of her sister currently hospitalized. Denies skin break down or rash.   PE  BP (!) 146/78   Pulse (!) 109   Ht 5\' 9"  (1.753 m)   Wt 218 lb (98.9 kg)   SpO2 98%   BMI 32.19 kg/m   Patient alert and oriented and in no cardiopulmonary distress.Pt In pain  HEENT: No facial asymmetry, EOMI,     Neck supple .  Chest: Clear to auscultation bilaterally.  CVS: S1, S2 no murmurs, no S3.Regular rate.  ABD: Soft non tender.   Ext: No edema  MS: Markedly decreased  ROM spine,left hip  and knee  Skin: Intact, no ulcerations or rash noted.  Psych: Good eye contact, normal affect. Memory intact not anxious or depressed appearing.  CNS: CN 2-12 intact, power,  normal throughout.no focal deficits noted.   Assessment & Plan  Back pain with left-sided  radiculopathy tylenol extra strength twice dailuy a, lidoderm patch and gabapentin at bedtime , also PT x 1 month. Fall precautions discussed  Uncontrolled type 2 diabetes mellitus with hyperglycemia (Stacey Hanson) Stacey Hanson is reminded of the importance of commitment to daily physical activity for 30 minutes or more, as able and the need to limit carbohydrate intake to 30 to 60 grams per meal to help with blood sugar control.   The need to take medication as prescribed, test blood sugar as directed, and to call between visits if there is a concern that blood sugar is uncontrolled is also discussed.   Stacey Hanson is reminded of the importance of daily foot exam, annual eye examination, and good blood sugar, blood pressure and cholesterol control. Improving,managed by Endo, applauded on improvement  Diabetic Labs Latest Ref Rng & Units 09/15/2019 09/06/2019 05/22/2019 03/07/2019 11/09/2018  HbA1c 4.0 - 5.6 % 8.4(A) - 10.0(H) 9.8(H) 7.7(H)  Microalbumin Not estab mg/dL - - - - -  Micro/Creat Ratio 0 - 29 mg/g creat - - - - -  Chol 100 - 199 mg/dL - - 179 - -  HDL >39 mg/dL - - 66 - -  Calc LDL 0 - 99 mg/dL - - 95 - -  Triglycerides 0 - 149 mg/dL - - 99 - -  Creatinine 0.57 - 1.00 mg/dL - 1.21(H) 1.20(H) 1.07(H) 1.03(H)   BP/Weight 09/15/2019 09/14/2019 06/14/2019 05/29/2019 05/25/2019 3/41/9622 04/11/7987  Systolic BP 211 941 740 814 481 856 314  Diastolic BP 76  72 86 83 79 75 98  Wt. (Lbs) 220.6 218 217.6 218.2 216.8 - -  BMI 32.58 32.19 32.13 32.22 32.02 - -   Foot/eye exam completion dates Latest Ref Rng & Units 05/25/2019 11/23/2018  Eye Exam No Retinopathy - -  Foot exam Order - - -  Foot Form Completion - Done Done        ESSENTIAL HYPERTENSION, BENIGN Elevated at visit , however pt in significant pain, no med change DASH diet and commitment to daily physical activity for a minimum of 30 minutes discussed and encouraged, as a part of hypertension management. The importance of attaining a healthy  weight is also discussed.  BP/Weight 09/15/2019 09/14/2019 06/14/2019 05/29/2019 05/25/2019 3/83/3383 04/10/1914  Systolic BP 606 004 599 774 142 395 320  Diastolic BP 76 72 86 83 79 75 98  Wt. (Lbs) 220.6 218 217.6 218.2 216.8 - -  BMI 32.58 32.19 32.13 32.22 32.02 - -       Obesity  Patient re-educated about  the importance of commitment to a  minimum of 150 minutes of exercise per week as able.  The importance of healthy food choices with portion control discussed, as well as eating regularly and within a 12 hour window most days. The need to choose "clean , green" food 50 to 75% of the time is discussed, as well as to make water the primary drink and set a goal of 64 ounces water daily.    Weight /BMI 09/15/2019 09/14/2019 06/14/2019  WEIGHT 220 lb 9.6 oz 218 lb 217 lb 9.6 oz  HEIGHT 5\' 9"  5\' 9"  5\' 9"   BMI 32.58 kg/m2 32.19 kg/m2 32.13 kg/m2      Mixed hyperlipidemia Hyperlipidemia:Low fat diet discussed and encouraged.   Lipid Panel  Lab Results  Component Value Date   CHOL 179 05/22/2019   HDL 66 05/22/2019   LDLCALC 95 05/22/2019   TRIG 99 05/22/2019   CHOLHDL 2.7 11/06/2017   Controlled, no change in medication     Hypothyroidism, postradioiodine therapy Managed by Endo and stable and controlled on current regime, no change

## 2019-09-15 ENCOUNTER — Encounter: Payer: Self-pay | Admitting: "Endocrinology

## 2019-09-15 ENCOUNTER — Encounter: Payer: Self-pay | Admitting: Family Medicine

## 2019-09-15 ENCOUNTER — Ambulatory Visit (INDEPENDENT_AMBULATORY_CARE_PROVIDER_SITE_OTHER): Payer: Medicare Other | Admitting: "Endocrinology

## 2019-09-15 VITALS — BP 118/76 | HR 98 | Ht 69.0 in | Wt 220.6 lb

## 2019-09-15 DIAGNOSIS — E1165 Type 2 diabetes mellitus with hyperglycemia: Secondary | ICD-10-CM | POA: Diagnosis not present

## 2019-09-15 DIAGNOSIS — I10 Essential (primary) hypertension: Secondary | ICD-10-CM | POA: Diagnosis not present

## 2019-09-15 DIAGNOSIS — E890 Postprocedural hypothyroidism: Secondary | ICD-10-CM | POA: Diagnosis not present

## 2019-09-15 DIAGNOSIS — E782 Mixed hyperlipidemia: Secondary | ICD-10-CM | POA: Diagnosis not present

## 2019-09-15 LAB — POCT GLYCOSYLATED HEMOGLOBIN (HGB A1C): Hemoglobin A1C: 8.4 % — AB (ref 4.0–5.6)

## 2019-09-15 MED ORDER — INSULIN LISPRO PROT & LISPRO (75-25 MIX) 100 UNIT/ML KWIKPEN: PEN_INJECTOR | SUBCUTANEOUS | 4 refills | Status: DC

## 2019-09-15 NOTE — Assessment & Plan Note (Signed)
Managed by Endo and stable and controlled on current regime, no change

## 2019-09-15 NOTE — Assessment & Plan Note (Signed)
Elevated at visit , however pt in significant pain, no med change DASH diet and commitment to daily physical activity for a minimum of 30 minutes discussed and encouraged, as a part of hypertension management. The importance of attaining a healthy weight is also discussed.  BP/Weight 09/15/2019 09/14/2019 06/14/2019 05/29/2019 05/25/2019 9/98/3382 5/0/5397  Systolic BP 673 419 379 024 097 353 299  Diastolic BP 76 72 86 83 79 75 98  Wt. (Lbs) 220.6 218 217.6 218.2 216.8 - -  BMI 32.58 32.19 32.13 32.22 32.02 - -

## 2019-09-15 NOTE — Progress Notes (Signed)
09/15/2019  Endocrinology consult note  Subjective:    Patient ID: Stacey Hanson, female    DOB: May 30, 1945, PCP Fayrene Helper, MD   Past Medical History:  Diagnosis Date  . BMI 30.0-30.9,adult 2012 215 LBS   2004 198 LBS  . Diabetes mellitus   . 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 GERD (gastroesophageal reflux disease)   . H. pylori infection 09/24/2010  . Hyperlipidemia   . Hypertension   . Hypothyroidism   . 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 Knee pain   . Obesity    Past Surgical History:  Procedure Laterality Date  . ABDOMINAL HYSTERECTOMY  1970  . CATARACT EXTRACTION W/PHACO Left 03/09/2019   Procedure: CATARACT EXTRACTION PHACO AND INTRAOCULAR LENS PLACEMENT (IOC);  Surgeon: Baruch Goldmann, MD;  Location: [facility];  Service: Ophthalmology;  Laterality: Left;  CDE: 9.08  . CATARACT EXTRACTION W/PHACO Right 03/27/2019   Procedure: CATARACT EXTRACTION PHACO AND INTRAOCULAR LENS PLACEMENT RIGHT EYE;  Surgeon: Baruch Goldmann, MD;  Location: [facility];  Service: Ophthalmology;  Laterality: Right;  CDE: 7.47  . CHOLECYSTECTOMY  OCT 2010 BZ BILIARY DYSKINESIA   CHRONIC CHOLECYSTITIS  . COLONOSCOPY  MAY 2009 SCREENING   pTC TICS, SML IH  . COLONOSCOPY N/A 08/11/2017   Procedure: COLONOSCOPY;  Surgeon: Danie Binder, MD;  Location: [facility];  Service: Endoscopy;  Laterality: N/A;  9:30am  . ESOPHAGOGASTRODUODENOSCOPY  10/03/2010   Procedure: ESOPHAGOGASTRODUODENOSCOPY (EGD);  Surgeon: Dorothyann Peng, MD;  Location: [facility];  Service: Endoscopy;  Laterality: N/A;  . ESOPHAGOGASTRODUODENOSCOPY  12/19/2002   RMR: Normal esophagus/Normal stomach/ Duodenum large bulbar diverticulum, 1 cm bulbar ulcer with surrounding/ inflammation as described above. Normal D2  . POLYPECTOMY  08/11/2017   Procedure: POLYPECTOMY;  Surgeon: Danie Binder, MD;  Location: [facility];  Service: Endoscopy;;  cecal   . SAVORY DILATION  10/03/2010    Procedure: SAVORY DILATION;  Surgeon: Dorothyann Peng, MD;  Location: [facility];  Service: Endoscopy;;  . TOTAL ABDOMINAL HYSTERECTOMY  1970  . TOTAL KNEE ARTHROPLASTY Left 12/21/2017   Procedure: LEFT TOTAL KNEE ARTHROPLASTY;  Surgeon: Paralee Cancel, MD;  Location: [facility];  Service: Orthopedics;  Laterality: Left;  70 mins block  . UPPER GASTROINTESTINAL ENDOSCOPY  OCT 2004 RMR   DUO TIC, & ULCER   Social History   Socioeconomic History  . Marital status: Single    Spouse name: Not on file  . Number of children: 3  . Years of education: 10  . Highest education level: 10th grade  Occupational History  . Occupation: [employer]  . Occupation: Retired   Tobacco Use  . Smoking status: Former Smoker    Packs/day: 0.25    Years: 10.00    Pack years: 2.50    Types: Cigarettes    Quit date: 02/20/1985    Years since quitting: 34.5  . Smokeless tobacco: Never Used  Vaping Use  . Vaping Use: Never used  Substance and Sexual Activity  . Alcohol use: No  . Drug use: No  . Sexual activity: Not Currently  Other Topics Concern  . Not on file  Social History Narrative   Lives alone    Social Determinants of Health   Financial Resource Strain:   . Difficulty of Paying Living Expenses:   Food Insecurity:   . Worried About Charity fundraiser in the Last Year:   .  Ran Out of Food in the Last Year:   Transportation Needs:   . Film/video editor (Medical):   Marland Kitchen Lack of Transportation (Non-Medical):   Physical Activity:   . Days of Exercise per Week:   . Minutes of Exercise per Session:   Stress:   . Feeling of Stress :   Social Connections:   . Frequency of Communication with Friends and Family:   . Frequency of Social Gatherings with Friends and Family:   . Attends Religious Services:   . Active Member of Clubs or Organizations:   . Attends Archivist Meetings:   Marland Kitchen Marital Status:    Outpatient Encounter Medications as of 09/15/2019  Medication Sig  . aspirin  EC 81 MG tablet Take 81 mg by mouth daily.  . B-D UF III MINI PEN NEEDLES 31G X 5 MM MISC USE FOR TWICE DAILY INJECTIONS OF HUMALOG  . benazepril (LOTENSIN) 40 MG tablet TAKE 1 TABLET(40 MG) BY MOUTH DAILY  . Blood Glucose Monitoring Suppl (ONE TOUCH ULTRA 2) w/Device KIT Twice daily testing dx e11.65  . Calcium Carb-Cholecalciferol (CALCIUM 600/VITAMIN D3 PO) Take 2 tablets by mouth daily.  Marland Kitchen gabapentin (NEURONTIN) 100 MG capsule Take one to two capsules at bedtime for nerve pain  . glipiZIDE (GLUCOTROL XL) 5 MG 24 hr tablet Take 1 tablet (5 mg total) by mouth daily with breakfast.  . hydrochlorothiazide (HYDRODIURIL) 25 MG tablet TAKE 1 TABLET(25 MG) BY MOUTH DAILY  . Insulin Lispro Prot & Lispro (HUMALOG MIX 75/25 KWIKPEN) (75-25) 100 UNIT/ML Kwikpen ADMINISTER 50 units with breakfast and 50 units with supper when glucose is above 28m/dl.  . Lancets (ONETOUCH DELICA PLUS [id number] MISC USE 1 LANCET  TO CHECK GLUCOSE TWICE DAILY AS DIRECTED  . levothyroxine (SYNTHROID) 88 MCG tablet TAKE 1 TABLET(88 MCG) BY MOUTH DAILY BEFORE BREAKFAST  . lidocaine (LIDODERM) 5 % Place 1 patch onto the skin daily. Remove & Discard patch within 12 hours or as directed by MD  . metFORMIN (GLUCOPHAGE) 500 MG tablet Take 1 tablet (500 mg total) by mouth 2 (two) times daily with a meal.  . Multiple Vitamin (MULTIVITAMIN) capsule Take 1 capsule by mouth daily.    .Marland Kitchenomeprazole (PRILOSEC) 20 MG capsule TAKE 1 CAPSULE BY MOUTH EVERY MORNING (Patient taking differently: Take 20 mg by mouth every morning. )  . ONETOUCH ULTRA test strip USE 1 STRIP TO CHECK GLUCOSE TWICE DAILY  . pravastatin (PRAVACHOL) 40 MG tablet TAKE 1 TABLET(40 MG) BY MOUTH DAILY  . UNABLE TO FIND Diabetic shoes x 1 pair, Inserts x 3 pair DX E11.9  . [DISCONTINUED] Insulin Lispro Prot & Lispro (HUMALOG MIX 75/25 KWIKPEN) (75-25) 100 UNIT/ML Kwikpen ADMINISTER 50 units with breakfast and 45 units with supper when glucose is above 945mdl.   No  facility-administered encounter medications on file as of 09/15/2019.   ALLERGIES: Allergies  Allergen Reactions  . Simvastatin Other (See Comments)    Muscle cramps   VACCINATION STATUS: Immunization History  Administered Date(s) Administered  . Fluad Quad(high Dose 65+) 11/23/2018  . Influenza,inj,Quad PF,6+ Mos 11/16/2012, 11/15/2013, 11/14/2014, 11/14/2015, 11/03/2016, 11/09/2017  . Influenza,inj,quad, With Preservative 11/02/2016  . Moderna SARS-COVID-2 Vaccination 04/27/2019, 05/26/2019  . Pneumococcal Conjugate-13 10/11/2013  . Pneumococcal Polysaccharide-23 10/07/2011  . Zoster 04/05/2006    Hypertension The current episode started more than 1 year ago. The problem is uncontrolled. Pertinent negatives include no chest pain, headaches, palpitations or shortness of breath. Risk factors for coronary artery disease include diabetes  mellitus, dyslipidemia, obesity, sedentary lifestyle and smoking/tobacco exposure. Past treatments include ACE inhibitors.  Diabetes She presents for her follow-up diabetic visit. She has type 2 diabetes mellitus. Onset time: She was diagnosed at approximate age of 9 years. Her disease course has been improving. There are no hypoglycemic associated symptoms. Pertinent negatives for hypoglycemia include no confusion, headaches, pallor or seizures. Pertinent negatives for diabetes include no chest pain, no fatigue, no polydipsia, no polyphagia and no polyuria. There are no hypoglycemic complications. Symptoms are improving. Risk factors for coronary artery disease include dyslipidemia, diabetes mellitus, obesity, hypertension, sedentary lifestyle, post-menopausal and tobacco exposure. Current diabetic treatment includes insulin injections. Her weight is increasing steadily. She is following a generally unhealthy diet. When asked about meal planning, she reported none. Her home blood glucose trend is increasing steadily. Her breakfast blood glucose range is  generally 140-180 mg/dl. Her dinner blood glucose range is generally 140-180 mg/dl. Her overall blood glucose range is 140-180 mg/dl. (She presents with continued improvement in her glycemic profile, still significantly above target at fasting.  Her point-of-care A1c is 8.4% improving from 10%.  She did not document or report hypoglycemia.       ) An ACE inhibitor/angiotensin II receptor blocker is being taken. Eye exam is current.  -She is also status post radioactive iodine treatment for toxic nodule on October 17, 2010.  She has had  history of multinodular goiter status post fine-needle aspiration with benign findings. 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  She is currently on levothyroxine 88 mcg p.o. nightly.  She reports compliance to her medication.  She denies any new complaints today.   She denies heat intolerance/cold intolerance. -She is referred this time for management of her diabetes.  Review of Systems  Constitutional: Negative for chills, fatigue, fever and unexpected weight change.  HENT: Negative for trouble swallowing and voice change.   Eyes: Negative for visual disturbance.  Respiratory: Negative for cough, shortness of breath and wheezing.   Cardiovascular: Negative for chest pain, palpitations and leg swelling.  Gastrointestinal: Negative for diarrhea, nausea and vomiting.  Endocrine: Negative for cold intolerance, heat intolerance, polydipsia, polyphagia and polyuria.  Musculoskeletal: Negative for arthralgias and myalgias.  Skin: Negative for color change, pallor, rash and wound.  Neurological: Negative for seizures and headaches.  Psychiatric/Behavioral: Negative for confusion and suicidal ideas.    Objective:    BP 118/76   Pulse 98   Ht '5\' 9"'  (1.753 m)   Wt 220 lb 9.6 oz (100.1 kg)   BMI 32.58 kg/m   Wt Readings from Last 3 Encounters:  09/15/19 220 lb 9.6 oz (100.1 kg)  09/14/19 218 lb (98.9 kg)  06/14/19 217 lb 9.6 oz  (98.7 kg)     Physical Exam- Limited  Constitutional:  Body mass index is 32.58 kg/m. , not in acute distress, normal state of mind Eyes:  EOMI, no exophthalmos Neck: Supple Thyroid: No gross goiter Respiratory: Adequate breathing efforts Musculoskeletal: no gross deformities, strength intact in all four extremities, no gross restriction of joint movements Skin:  no rashes, no hyperemia Neurological: no tremor with outstretched hands,     CMP     Component Value Date/Time   NA 133 (L) 09/06/2019 0907   K 4.6 09/06/2019 0907   CL 95 (L) 09/06/2019 0907   CO2 19 (L) 09/06/2019 0907   GLUCOSE 318 (H) 09/06/2019 0907   GLUCOSE 412 (H) 03/07/2019 0940   BUN 29 (H) 09/06/2019 6606  CREATININE 1.21 (H) 09/06/2019 0907   CREATININE 0.96 (H) 03/12/2016 0925   CALCIUM 9.6 09/06/2019 0907   PROT 7.8 09/06/2019 0907   ALBUMIN 4.6 09/06/2019 0907   AST 20 09/06/2019 0907   ALT 19 09/06/2019 0907   ALKPHOS 114 09/06/2019 0907   BILITOT 0.3 09/06/2019 0907   GFRNONAA 44 (L) 09/06/2019 0907   GFRNONAA 60 03/12/2016 0925   GFRAA 51 (L) 09/06/2019 0907   GFRAA 69 03/12/2016 0925     Diabetic Labs (most recent): Lab Results  Component Value Date   HGBA1C 8.4 (A) 09/15/2019   HGBA1C 10.0 (H) 05/22/2019   HGBA1C 9.8 (H) 03/07/2019     Lipid Panel ( most recent) Lipid Panel     Component Value Date/Time   CHOL 179 05/22/2019 0941   TRIG 99 05/22/2019 0941   HDL 66 05/22/2019 0941   CHOLHDL 2.7 11/06/2017 0947   CHOLHDL 2.2 01/11/2016 0916   VLDL 11 01/11/2016 0916   LDLCALC 95 05/22/2019 0941    Assessment & Plan:   1. Uncontrolled type 2 diabetes mellitus with stage 3 chronic kidney disease, with long-term current use of insulin ([place])  -  She is being referred for the management of her diabetes this time.  She will continue to need insulin treatment in order for her to achieve control of diabetes to target.    She presents with continued improvement in her glycemic  profile, still significantly above target at fasting.  Her point-of-care A1c is 8.4% improving from 10%.  She did not document or report hypoglycemia.    - she  admits there is a room for improvement in her diet and drink choices. -  Suggestion is made for her to avoid simple carbohydrates  from her diet including Cakes, Sweet Desserts / Pastries, Ice Cream, Soda (diet and regular), Sweet Tea, Candies, Chips, Cookies, Sweet Pastries,  Store Bought Juices, Alcohol in Excess of  1-2 drinks a day, Artificial Sweeteners, Coffee Creamer, and "Sugar-free" Products. This will help patient to have stable blood glucose profile and potentially avoid unintended weight gain.   She is advised to increase her Humalog 75/25 to 50 units at breakfast, 50 units at supper for Premeal blood glucose readings above 90 mg per DL.   -She is advised to continue monitoring blood glucose at least twice a day-before breakfast and before supper, and at any other time as needed.      She is advised to lower Metformin to 500 mg p.o. twice daily due to CKD.    -She has benefited from low-dose glipizide.  She is advised to continue  glipizide 5 mg XL p.o. daily at breakfast.   2. Hypothyroidism- RAI  -Her previsit thyroid function tests are consistent with appropriate replacement.  -She is advised to continue levothyroxine 88 mcg p.o. daily before breakfast.    - We discussed about the correct intake of her thyroid hormone, on empty stomach at fasting, with water, separated by at least 30 minutes from breakfast and other medications,  and separated by more than 4 hours from calcium, iron, multivitamins, acid reflux medications (PPIs). -Patient is made aware of the fact that thyroid hormone replacement is needed for life, dose to be adjusted by periodic monitoring of thyroid function tests.  3) weight management: Her BMI 32.5-a candidate for modest weight loss. -Patient specific carbs restrictions and exercise regimen  discussed with her.     - I advised patient to maintain close follow up with Fayrene Helper,  MD for diabetes and all other primary care needs.   - Time spent on this patient care encounter:  35 min, of which > 50% was spent in  counseling and the rest reviewing her blood glucose logs , discussing her hypoglycemia and hyperglycemia episodes, reviewing her current and  previous labs / studies  ( including abstraction from other facilities) and medications  doses and developing a  long term treatment plan and documenting her care.   Please refer to Patient Instructions for Blood Glucose Monitoring and Insulin/Medications Dosing Guide"  in media tab for additional information. Please  also refer to " Patient Self Inventory" in the Media  tab for reviewed elements of pertinent patient history.  Stacey Hanson participated in the discussions, expressed understanding, and voiced agreement with the above plans.  All questions were answered to her satisfaction. she is encouraged to contact clinic should she have any questions or concerns prior to her return visit.  Follow up plan: Return in about 3 months (around 12/16/2019) for Bring Meter and Logs- A1c in Office.  Glade Lloyd, MD Phone: [phone]  Fax: [phone]  -  This note was partially dictated with voice recognition software. Similar sounding words can be transcribed inadequately or may not  be corrected upon review.  09/15/2019, 12:12 PM

## 2019-09-15 NOTE — Assessment & Plan Note (Signed)
Stacey Hanson is reminded of the importance of commitment to daily physical activity for 30 minutes or more, as able and the need to limit carbohydrate intake to 30 to 60 grams per meal to help with blood sugar control.   The need to take medication as prescribed, test blood sugar as directed, and to call between visits if there is a concern that blood sugar is uncontrolled is also discussed.   Stacey Hanson is reminded of the importance of daily foot exam, annual eye examination, and good blood sugar, blood pressure and cholesterol control. Improving,managed by Endo, applauded on improvement  Diabetic Labs Latest Ref Rng & Units 09/15/2019 09/06/2019 05/22/2019 03/07/2019 11/09/2018  HbA1c 4.0 - 5.6 % 8.4(A) - 10.0(H) 9.8(H) 7.7(H)  Microalbumin Not estab mg/dL - - - - -  Micro/Creat Ratio 0 - 29 mg/g creat - - - - -  Chol 100 - 199 mg/dL - - 179 - -  HDL >39 mg/dL - - 66 - -  Calc LDL 0 - 99 mg/dL - - 95 - -  Triglycerides 0 - 149 mg/dL - - 99 - -  Creatinine 0.57 - 1.00 mg/dL - 1.21(H) 1.20(H) 1.07(H) 1.03(H)   BP/Weight 09/15/2019 09/14/2019 06/14/2019 05/29/2019 05/25/2019 3/57/0177 11/02/9028  Systolic BP 092 330 076 226 333 545 625  Diastolic BP 76 72 86 83 79 75 98  Wt. (Lbs) 220.6 218 217.6 218.2 216.8 - -  BMI 32.58 32.19 32.13 32.22 32.02 - -   Foot/eye exam completion dates Latest Ref Rng & Units 05/25/2019 11/23/2018  Eye Exam No Retinopathy - -  Foot exam Order - - -  Foot Form Completion - Done Done

## 2019-09-15 NOTE — Patient Instructions (Signed)

## 2019-09-15 NOTE — Assessment & Plan Note (Signed)
Hyperlipidemia:Low fat diet discussed and encouraged.   Lipid Panel  Lab Results  Component Value Date   CHOL 179 05/22/2019   HDL 66 05/22/2019   LDLCALC 95 05/22/2019   TRIG 99 05/22/2019   CHOLHDL 2.7 11/06/2017   Controlled, no change in medication

## 2019-09-15 NOTE — Assessment & Plan Note (Signed)
  Patient re-educated about  the importance of commitment to a  minimum of 150 minutes of exercise per week as able.  The importance of healthy food choices with portion control discussed, as well as eating regularly and within a 12 hour window most days. The need to choose "clean , green" food 50 to 75% of the time is discussed, as well as to make water the primary drink and set a goal of 64 ounces water daily.    Weight /BMI 09/15/2019 09/14/2019 06/14/2019  WEIGHT 220 lb 9.6 oz 218 lb 217 lb 9.6 oz  HEIGHT 5\' 9"  5\' 9"  5\' 9"   BMI 32.58 kg/m2 32.19 kg/m2 32.13 kg/m2

## 2019-10-03 ENCOUNTER — Ambulatory Visit: Payer: Medicare Other | Admitting: Family Medicine

## 2019-10-19 ENCOUNTER — Other Ambulatory Visit: Payer: Self-pay | Admitting: "Endocrinology

## 2019-10-22 ENCOUNTER — Other Ambulatory Visit: Payer: Self-pay | Admitting: Family Medicine

## 2019-10-26 ENCOUNTER — Telehealth: Payer: Self-pay | Admitting: "Endocrinology

## 2019-10-26 DIAGNOSIS — E1165 Type 2 diabetes mellitus with hyperglycemia: Secondary | ICD-10-CM

## 2019-10-26 MED ORDER — METFORMIN HCL 500 MG PO TABS: 500.0000 mg | ORAL_TABLET | Freq: Two times a day (BID) | ORAL | 0 refills | Status: DC

## 2019-10-26 NOTE — Telephone Encounter (Signed)
Pt requesting refill on metFORMIN (GLUCOPHAGE) 500 MG tablet

## 2019-10-26 NOTE — Telephone Encounter (Signed)
Rx sent 

## 2019-12-04 ENCOUNTER — Other Ambulatory Visit: Payer: Self-pay | Admitting: "Endocrinology

## 2019-12-11 ENCOUNTER — Other Ambulatory Visit: Payer: Self-pay | Admitting: Family Medicine

## 2019-12-18 ENCOUNTER — Ambulatory Visit (INDEPENDENT_AMBULATORY_CARE_PROVIDER_SITE_OTHER): Payer: Medicare Other | Admitting: Nurse Practitioner

## 2019-12-18 ENCOUNTER — Other Ambulatory Visit: Payer: Self-pay

## 2019-12-18 ENCOUNTER — Encounter: Payer: Self-pay | Admitting: Nurse Practitioner

## 2019-12-18 VITALS — BP 168/82 | HR 109 | Ht 69.0 in | Wt 220.8 lb

## 2019-12-18 DIAGNOSIS — E890 Postprocedural hypothyroidism: Secondary | ICD-10-CM

## 2019-12-18 DIAGNOSIS — E782 Mixed hyperlipidemia: Secondary | ICD-10-CM

## 2019-12-18 DIAGNOSIS — E1165 Type 2 diabetes mellitus with hyperglycemia: Secondary | ICD-10-CM

## 2019-12-18 DIAGNOSIS — I10 Essential (primary) hypertension: Secondary | ICD-10-CM

## 2019-12-18 LAB — POCT GLYCOSYLATED HEMOGLOBIN (HGB A1C): Hemoglobin A1C: 7.3 % — AB (ref 4.0–5.6)

## 2019-12-18 MED ORDER — INSULIN LISPRO PROT & LISPRO (75-25 MIX) 100 UNIT/ML KWIKPEN: PEN_INJECTOR | SUBCUTANEOUS | 2 refills | Status: DC

## 2019-12-18 NOTE — Progress Notes (Signed)
12/18/2019  Endocrinology consult note  Subjective:    Patient ID: Stacey Hanson, female    DOB: 25-Oct-1945, PCP Fayrene Helper, MD   Past Medical History:  Diagnosis Date  . BMI 30.0-30.9,adult 2012 215 LBS   2004 198 LBS  . Diabetes mellitus   . 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 GERD (gastroesophageal reflux disease)   . H. pylori infection 09/24/2010  . Hyperlipidemia   . Hypertension   . Hypothyroidism   . 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 Knee pain   . Obesity    Past Surgical History:  Procedure Laterality Date  . ABDOMINAL HYSTERECTOMY  1970  . CATARACT EXTRACTION W/PHACO Left 03/09/2019   Procedure: CATARACT EXTRACTION PHACO AND INTRAOCULAR LENS PLACEMENT (IOC);  Surgeon: Baruch Goldmann, MD;  Location: [facility];  Service: Ophthalmology;  Laterality: Left;  CDE: 9.08  . CATARACT EXTRACTION W/PHACO Right 03/27/2019   Procedure: CATARACT EXTRACTION PHACO AND INTRAOCULAR LENS PLACEMENT RIGHT EYE;  Surgeon: Baruch Goldmann, MD;  Location: [facility];  Service: Ophthalmology;  Laterality: Right;  CDE: 7.47  . CHOLECYSTECTOMY  OCT 2010 BZ BILIARY DYSKINESIA   CHRONIC CHOLECYSTITIS  . COLONOSCOPY  MAY 2009 SCREENING   pTC TICS, SML IH  . COLONOSCOPY N/A 08/11/2017   Procedure: COLONOSCOPY;  Surgeon: Danie Binder, MD;  Location: [facility];  Service: Endoscopy;  Laterality: N/A;  9:30am  . ESOPHAGOGASTRODUODENOSCOPY  10/03/2010   Procedure: ESOPHAGOGASTRODUODENOSCOPY (EGD);  Surgeon: Dorothyann Peng, MD;  Location: [facility];  Service: Endoscopy;  Laterality: N/A;  . ESOPHAGOGASTRODUODENOSCOPY  12/19/2002   RMR: Normal esophagus/Normal stomach/ Duodenum large bulbar diverticulum, 1 cm bulbar ulcer with surrounding/ inflammation as described above. Normal D2  . POLYPECTOMY  08/11/2017   Procedure: POLYPECTOMY;  Surgeon: Danie Binder, MD;  Location: [facility];  Service: Endoscopy;;  cecal   . SAVORY DILATION  10/03/2010    Procedure: SAVORY DILATION;  Surgeon: Dorothyann Peng, MD;  Location: [facility];  Service: Endoscopy;;  . TOTAL ABDOMINAL HYSTERECTOMY  1970  . TOTAL KNEE ARTHROPLASTY Left 12/21/2017   Procedure: LEFT TOTAL KNEE ARTHROPLASTY;  Surgeon: Paralee Cancel, MD;  Location: [facility];  Service: Orthopedics;  Laterality: Left;  70 mins block  . UPPER GASTROINTESTINAL ENDOSCOPY  OCT 2004 RMR   DUO TIC, & ULCER   Social History   Socioeconomic History  . Marital status: Single    Spouse name: Not on file  . Number of children: 3  . Years of education: 10  . Highest education level: 10th grade  Occupational History  . Occupation: [employer]  . Occupation: Retired   Tobacco Use  . Smoking status: Former Smoker    Packs/day: 0.25    Years: 10.00    Pack years: 2.50    Types: Cigarettes    Quit date: 02/20/1985    Years since quitting: 34.8  . Smokeless tobacco: Never Used  Vaping Use  . Vaping Use: Never used  Substance and Sexual Activity  . Alcohol use: No  . Drug use: No  . Sexual activity: Not Currently  Other Topics Concern  . Not on file  Social History Narrative   Lives alone    Social Determinants of Health   Financial Resource Strain:   . Difficulty of Paying Living Expenses: Not on file  Food Insecurity:   . Worried About Charity fundraiser in the Last Year:  Not on file  . Ran Out of Food in the Last Year: Not on file  Transportation Needs:   . Lack of Transportation (Medical): Not on file  . Lack of Transportation (Non-Medical): Not on file  Physical Activity:   . Days of Exercise per Week: Not on file  . Minutes of Exercise per Session: Not on file  Stress:   . Feeling of Stress : Not on file  Social Connections:   . Frequency of Communication with Friends and Family: Not on file  . Frequency of Social Gatherings with Friends and Family: Not on file  . Attends Religious Services: Not on file  . Active Member of Clubs or Organizations: Not on file  . Attends  Banker Meetings: Not on file  . Marital Status: Not on file   Outpatient Encounter Medications as of 12/18/2019  Medication Sig  . aspirin EC 81 MG tablet Take 81 mg by mouth daily.  . B-D UF III MINI PEN NEEDLES 31G X 5 MM MISC USE FOR TWICE DAILY INJECTIONS OF HUMALOG  . B-D ULTRAFINE III SHORT PEN 31G X 8 MM MISC USE TO INJECT INSULIN TWICE DAILY  . benazepril (LOTENSIN) 40 MG tablet TAKE 1 TABLET(40 MG) BY MOUTH DAILY  . Blood Glucose Monitoring Suppl (ONE TOUCH ULTRA 2) w/Device KIT Twice daily testing dx e11.65  . Calcium Carb-Cholecalciferol (CALCIUM 600/VITAMIN D3 PO) Take 2 tablets by mouth daily.  Marland Kitchen gabapentin (NEURONTIN) 100 MG capsule Take one to two capsules at bedtime for nerve pain  . glipiZIDE (GLUCOTROL XL) 5 MG 24 hr tablet TAKE 1 TABLET(5 MG) BY MOUTH DAILY WITH BREAKFAST  . hydrochlorothiazide (HYDRODIURIL) 25 MG tablet TAKE 1 TABLET(25 MG) BY MOUTH DAILY  . Insulin Lispro Prot & Lispro (HUMALOG MIX 75/25 KWIKPEN) (75-25) 100 UNIT/ML Kwikpen INJECT 60 UNITS UNDER THE SKIN WITH BREAKFAST AND 60 UNITS WITH SUPPER WHEN GLUCOSE IS ABOVE 90 MG/DL  . Lancets (ONETOUCH DELICA PLUS LANCET33G) MISC USE 1 LANCET  TO CHECK GLUCOSE TWICE DAILY AS DIRECTED  . levothyroxine (SYNTHROID) 88 MCG tablet TAKE 1 TABLET(88 MCG) BY MOUTH DAILY BEFORE BREAKFAST  . lidocaine (LIDODERM) 5 % Place 1 patch onto the skin daily. Remove & Discard patch within 12 hours or as directed by MD  . metFORMIN (GLUCOPHAGE) 500 MG tablet Take 1 tablet (500 mg total) by mouth 2 (two) times daily with a meal.  . Multiple Vitamin (MULTIVITAMIN) capsule Take 1 capsule by mouth daily.    Marland Kitchen omeprazole (PRILOSEC) 20 MG capsule TAKE 1 CAPSULE BY MOUTH EVERY MORNING (Patient taking differently: Take 20 mg by mouth every morning. )  . ONETOUCH ULTRA test strip USE 1 STRIP TO CHECK GLUCOSE TWICE DAILY  . pravastatin (PRAVACHOL) 40 MG tablet TAKE 1 TABLET(40 MG) BY MOUTH DAILY  . UNABLE TO FIND Diabetic shoes x  1 pair, Inserts x 3 pair DX E11.9  . [DISCONTINUED] Insulin Lispro Prot & Lispro (HUMALOG MIX 75/25 KWIKPEN) (75-25) 100 UNIT/ML Kwikpen INJECT 50 UNITS UNDER THE SKIN WITH BREAKFAST AND 50 UNITS WITH SUPPER WHEN GLUCOSE IS ABOVE 90 MG/DL   No facility-administered encounter medications on file as of 12/18/2019.   ALLERGIES: Allergies  Allergen Reactions  . Simvastatin Other (See Comments)    Muscle cramps   VACCINATION STATUS: Immunization History  Administered Date(s) Administered  . Fluad Quad(high Dose 65+) 11/23/2018  . Influenza,inj,Quad PF,6+ Mos 11/16/2012, 11/15/2013, 11/14/2014, 11/14/2015, 11/03/2016, 11/09/2017  . Influenza,inj,quad, With Preservative 11/02/2016  . Moderna SARS-COVID-2 Vaccination  04/27/2019, 05/26/2019  . Pneumococcal Conjugate-13 10/11/2013  . Pneumococcal Polysaccharide-23 10/07/2011  . Zoster 04/05/2006    Hypertension This is a chronic problem. The current episode started more than 1 year ago. The problem has been waxing and waning since onset. The problem is uncontrolled. Associated symptoms include sweats. Pertinent negatives include no blurred vision, headaches, palpitations or shortness of breath. Agents associated with hypertension include thyroid hormones. Risk factors for coronary artery disease include diabetes mellitus, dyslipidemia, obesity, sedentary lifestyle and smoking/tobacco exposure. Past treatments include ACE inhibitors and diuretics. The current treatment provides mild improvement. There are no compliance problems.  Hypertensive end-organ damage includes kidney disease. Identifiable causes of hypertension include a thyroid problem.  Diabetes She presents for her follow-up diabetic visit. She has type 2 diabetes mellitus. Onset time: She was diagnosed at approximate age of 52 years. Her disease course has been improving. Hypoglycemia symptoms include sweats. Pertinent negatives for hypoglycemia include no confusion, headaches,  nervousness/anxiousness or tremors. Pertinent negatives for diabetes include no blurred vision, no fatigue, no foot paresthesias, no polydipsia, no polyphagia and no polyuria. There are no hypoglycemic complications. Symptoms are improving. Diabetic complications include nephropathy. Risk factors for coronary artery disease include dyslipidemia, diabetes mellitus, obesity, hypertension, sedentary lifestyle, post-menopausal and tobacco exposure. Current diabetic treatment includes insulin injections and oral agent (dual therapy). She is compliant with treatment most of the time. Her weight is fluctuating minimally. She is following a generally unhealthy diet. When asked about meal planning, she reported none. She has not had a previous visit with a dietitian. She rarely participates in exercise. Her home blood glucose trend is decreasing steadily. Her breakfast blood glucose range is generally 140-180 mg/dl. Her dinner blood glucose range is generally 180-200 mg/dl. An ACE inhibitor/angiotensin II receptor blocker is being taken. She does not see a podiatrist.Eye exam is current.  Thyroid Problem Presents for follow-up (-She is also status post radioactive iodine treatment for toxic nodule on October 17, 2010.  She has had  history of multinodular goiter status post fine-needle aspiration with benign findings.) visit. Patient reports no anxiety, cold intolerance, constipation, depressed mood, diarrhea, fatigue, heat intolerance, leg swelling, palpitations or tremors.   Review of systems  Constitutional: + Minimally fluctuating body weight,  current Body mass index is 32.61 kg/m. , no fatigue, no subjective hyperthermia, no subjective hypothermia Eyes: no blurry vision, no xerophthalmia ENT: no sore throat, no nodules palpated in throat, no dysphagia/odynophagia, no hoarseness Cardiovascular: no chest pain, no shortness of breath, no palpitations, no leg swelling Respiratory: no cough, no shortness of  breath Gastrointestinal: no nausea/vomiting/diarrhea Musculoskeletal: She reports pain in her left back, hip, and knee Skin: no rashes, no hyperemia Neurological: no tremors, no numbness, no tingling, no dizziness Psychiatric: no depression, no anxiety  Objective:    BP (!) 168/82 (BP Location: Right Arm, Patient Position: Sitting)   Pulse (!) 109   Ht _0  (1.753 m)   Wt 220 lb 12.8 oz (100.2 kg)   BMI 32.61 kg/m   Wt Readings from Last 3 Encounters:  12/18/19 220 lb 12.8 oz (100.2 kg)  09/15/19 220 lb 9.6 oz (100.1 kg)  09/14/19 218 lb (98.9 kg)    BP Readings from Last 3 Encounters:  12/18/19 (!) 168/82  09/15/19 118/76  09/14/19 130/72    Physical Exam- Limited  Constitutional:  Body mass index is 32.61 kg/m. , not in acute distress, normal state of mind Eyes:  EOMI, no exophthalmos Neck: Supple Thyroid: No gross goiter Cardiovascular:  RRR, no murmers, rubs, or gallops, no edema Respiratory: Adequate breathing efforts, no crackles, rales, rhonchi, or wheezing Musculoskeletal: no gross deformities, strength intact in all four extremities, no gross restriction of joint movements Skin:  no rashes, no hyperemia Neurological: no tremor with outstretched hands    CMP     Component Value Date/Time   NA 133 (L) 09/06/2019 0907   K 4.6 09/06/2019 0907   CL 95 (L) 09/06/2019 0907   CO2 19 (L) 09/06/2019 0907   GLUCOSE 318 (H) 09/06/2019 0907   GLUCOSE 412 (H) 03/07/2019 0940   BUN 29 (H) 09/06/2019 0907   CREATININE 1.21 (H) 09/06/2019 0907   CREATININE 0.96 (H) 03/12/2016 0925   CALCIUM 9.6 09/06/2019 0907   PROT 7.8 09/06/2019 0907   ALBUMIN 4.6 09/06/2019 0907   AST 20 09/06/2019 0907   ALT 19 09/06/2019 0907   ALKPHOS 114 09/06/2019 0907   BILITOT 0.3 09/06/2019 0907   GFRNONAA 44 (L) 09/06/2019 0907   GFRNONAA 60 03/12/2016 0925   GFRAA 51 (L) 09/06/2019 0907   GFRAA 69 03/12/2016 0925     Diabetic Labs (most recent): Lab Results  Component Value  Date   HGBA1C 7.3 (A) 12/18/2019   HGBA1C 8.4 (A) 09/15/2019   HGBA1C 10.0 (H) 05/22/2019     Lipid Panel ( most recent) Lipid Panel     Component Value Date/Time   CHOL 179 05/22/2019 0941   TRIG 99 05/22/2019 0941   HDL 66 05/22/2019 0941   CHOLHDL 2.7 11/06/2017 0947   CHOLHDL 2.2 01/11/2016 0916   VLDL 11 01/11/2016 0916   LDLCALC 95 05/22/2019 0941    Assessment & Plan:   1. Uncontrolled type 2 diabetes mellitus with stage 3 chronic kidney disease, with long-term current use of insulin ([place])  -She will continue to need insulin treatment in order for her to achieve control of diabetes to target.    She presents today with her meter and logs showing improved, yet still above target fasting and postprandial glycemic profile.  Her POCT A1C today is 7.3%, improving from last visit of 8.4%.  She only reports rare episodes of hypoglycemia, usually associated to her meal timing.  - Nutritional counseling repeated at each appointment due to patients tendency to fall back in to old habits.  - The patient admits there is a room for improvement in their diet and drink choices. -  Suggestion is made for the patient to avoid simple carbohydrates from their diet including Cakes, Sweet Desserts / Pastries, Ice Cream, Soda (diet and regular), Sweet Tea, Candies, Chips, Cookies, Sweet Pastries,  Store Bought Juices, Alcohol in Excess of  1-2 drinks a day, Artificial Sweeteners, Coffee Creamer, and "Sugar-free" Products. This will help patient to have stable blood glucose profile and potentially avoid unintended weight gain.   - I encouraged the patient to switch to  unprocessed or minimally processed complex starch and increased protein intake (animal or plant source), fruits, and vegetables.   - Patient is advised to stick to a routine mealtimes to eat 3 meals  a day and avoid unnecessary snacks ( to snack only to correct hypoglycemia).  -She is responding well to Humalog 75/25.  Based on her  glycemic profile, she will tolerate slight increase in her dose to 60 units with breakfast and 60 units with supper if glucose readings are above 90 and she is eating. -She is advised to continue Metformin 500 mg po twice daily with meals and Glipizide 5 mg XL  daily with breakfast.  -She is advised to continue monitoring blood glucose at least 3 times daily, before injecting insulin at breakfast and supper, and before bed.  She is to contact the clinic if she has readings less than 70 or greater than 200 for 3 tests in a row.  2. Hypothyroidism- s/p RAI -She has no previsit thyroid function tests to review.  She is advised to continue Levothyroxine 88 mcg po daily before breakfast.  Will recheck TFTs prior to next visit and adjust dose if necessary.   - We discussed about the correct intake of her thyroid hormone, on empty stomach at fasting, with water, separated by at least 30 minutes from breakfast and other medications,  and separated by more than 4 hours from calcium, iron, multivitamins, acid reflux medications (PPIs). -Patient is made aware of the fact that thyroid hormone replacement is needed for life, dose to be adjusted by periodic monitoring of thyroid function tests.  3. Weight management:  Her Body mass index is 32.61 kg/m.-a candidate for modest weight loss.  Patient specific carbs restrictions and exercise regimen discussed with her.  4. Blood pressure/ Hypertension: -Her blood pressure is not controlled to target.  She reports the pain in her left side is causing it to be elevated on todays visit.  She is advised to continue Benazepril 40 mg po daily and HCTZ 25 mg po daily.  May need to consider med changes after next visit if BP continues to be elevated.  - I advised patient to maintain close follow up with Fayrene Helper, MD for diabetes and all other primary care needs.   - Time spent on this patient care encounter:  35 min, of which > 50% was spent in  counseling and  the rest reviewing her blood glucose logs , discussing her hypoglycemia and hyperglycemia episodes, reviewing her current and  previous labs / studies  ( including abstraction from other facilities) and medications  doses and developing a  long term treatment plan and documenting her care.   Please refer to Patient Instructions for Blood Glucose Monitoring and Insulin/Medications Dosing Guide"  in media tab for additional information. Please  also refer to " Patient Self Inventory" in the Media  tab for reviewed elements of pertinent patient history.  Stacey Hanson participated in the discussions, expressed understanding, and voiced agreement with the above plans.  All questions were answered to her satisfaction. she is encouraged to contact clinic should she have any questions or concerns prior to her return visit.  Follow up plan: Return in about 4 months (around 04/19/2020) for Diabetes follow up- A1c and urine micro in office, ABI next visit, Previsit labs.  Rayetta Pigg, [facility] [address] [place], [place] [address] Phone: [phone] Fax: [phone]  12/18/2019, 11:39 AM

## 2019-12-18 NOTE — Patient Instructions (Signed)
Advice for Weight Management  -For most of us the best way to lose weight is by diet management. Generally speaking, diet management means consuming less calories intentionally which over time brings about progressive weight loss.  This can be achieved more effectively by restricting carbohydrate consumption to the minimum possible.  So, it is critically important to know your numbers: how much calorie you are consuming and how much calorie you need. More importantly, our carbohydrates sources should be unprocessed or minimally processed complex starch food items.   Sometimes, it is important to balance nutrition by increasing protein intake (animal or plant source), fruits, and vegetables.  -Sticking to a routine mealtime to eat 3 meals a day and avoiding unnecessary snacks is shown to have a big role in weight control. Under normal circumstances, the only time we lose real weight is when we are hungry, so allow hunger to take place- hunger means no food between meal times, only water.  It is not advisable to starve.   -It is better to avoid simple carbohydrates including: Cakes, Sweet Desserts, Ice Cream, Soda (diet and regular), Sweet Tea, Candies, Chips, Cookies, Store Bought Juices, Alcohol in Excess of  1-2 drinks a day, Artificial Sweeteners, Doughnuts, Coffee Creamers, "Sugar-free" Products, etc, etc.  This is not a complete list.....    -Consulting with certified diabetes educators is proven to provide you with the most accurate and current information on diet.  Also, you may be  interested in discussing diet options/exchanges , we can schedule a visit with Penny Crumpton, RDN, CDE for individualized nutrition education.  -Exercise: If you are able: 30 -60 minutes a day ,4 days a week, or 150 minutes a week.  The longer the better.  Combine stretch, strength, and aerobic activities.  If you were told in the past that you have high risk for cardiovascular diseases, you may seek evaluation by  your heart doctor prior to initiating moderate to intense exercise programs.      - The correct intake of thyroid hormone (Levothyroxine, Synthroid), is on empty stomach first thing in the morning, with water, separated by at least 30 minutes from breakfast and other medications,  and separated by more than 4 hours from calcium, iron, multivitamins, acid reflux medications (PPIs).  - This medication is a life-long medication and will be needed to correct thyroid hormone imbalances for the rest of your life.  The dose may change from time to time, based on thyroid blood work.  - It is extremely important to be consistent taking this medication, near the same time each morning.  -AVOID TAKING PRODUCTS CONTAINING BIOTIN (commonly found in Hair, Skin, Nails vitamins) AS IT INTERFERES WITH THE VALIDITY OF THYROID FUNCTION BLOOD TESTS.  

## 2019-12-21 ENCOUNTER — Other Ambulatory Visit: Payer: Self-pay

## 2019-12-21 ENCOUNTER — Ambulatory Visit (INDEPENDENT_AMBULATORY_CARE_PROVIDER_SITE_OTHER): Payer: Medicare Other | Admitting: Family Medicine

## 2019-12-21 ENCOUNTER — Encounter: Payer: Self-pay | Admitting: Family Medicine

## 2019-12-21 VITALS — BP 130/72 | HR 92 | Resp 16 | Ht 67.0 in | Wt 221.0 lb

## 2019-12-21 DIAGNOSIS — Z23 Encounter for immunization: Secondary | ICD-10-CM

## 2019-12-21 DIAGNOSIS — Z0001 Encounter for general adult medical examination with abnormal findings: Secondary | ICD-10-CM | POA: Diagnosis not present

## 2019-12-21 DIAGNOSIS — M5410 Radiculopathy, site unspecified: Secondary | ICD-10-CM

## 2019-12-21 MED ORDER — PREDNISONE 5 MG PO TABS: 5.0000 mg | ORAL_TABLET | Freq: Two times a day (BID) | ORAL | 0 refills | Status: AC

## 2019-12-21 NOTE — Progress Notes (Signed)
    Stacey Hanson     MRN: [id number]      DOB: 09-Jun-1945  HPI: Patient is in for annual physical exam. C/o back pain radiating down left leg to ankle , worse in past 1 week, no aggravating factor. Denies new weakness or numbness. No new incontinence. Recent labs, if available are reviewed. Immunization is reviewed , and  updated   PE: BP 130/72   Pulse 92   Resp 16   Ht 5\' 7"  (1.702 m)   Wt 221 lb 0.6 oz (100.3 kg)   SpO2 97%   BMI 34.62 kg/m   Pleasant  female, alert and oriented x 3, in no cardio-pulmonary distress. Afebrile. HEENT No facial trauma or asymetry. Sinuses non tender.  Extra occullar muscles intact.. External ears normal, . Neck: supple, no adenopathy,JVD or thyromegaly.No bruits.  Chest: Clear to ascultation bilaterally.No crackles or wheezes. Non tender to palpation  Breast: Normal mammogram in 08/2019, not examined  Cardiovascular system; Heart sounds normal,  S1 and  S2 ,no S3.  No murmur, or thrill.  Peripheral pulses normal.  Abdomen: Soft, non tender, No guarding, tenderness or rebound.       Musculoskeletal exam: Decreased  ROM of spine,adequate in  hips , shoulders and knees.  deformity ,swelling and  crepitus noted. No muscle wasting or atrophy.   Neurologic: Cranial nerves 2 to 12 intact. Power, tone ,sensation and reflexes normal throughout. No disturbance in gait. No tremor.  Skin: Intact, no ulceration, erythema , scaling or rash noted. Pigmentation normal throughout  Psych; Normal mood and affect. Judgement and concentration normal   Assessment & Plan:  Encounter for annual general medical examination with abnormal findings in adult Annual exam as documented. Counseling done  re healthy lifestyle involving commitment to 150 minutes exercise per week, heart healthy diet, and attaining healthy weight.The importance of adequate sleep also discussed. Regular seat belt use and home safety, is also discussed. Changes in  health habits are decided on by the patient with goals and time frames  set for achieving them. Immunization and cancer screening needs are specifically addressed at this visit.   Back pain with left-sided radiculopathy Current flare, 5 day course of prednisone and tylenol daily

## 2019-12-21 NOTE — Patient Instructions (Addendum)
F/U in office with MD in 6 months, call if you need me sooner  Flu vaccine in office today  Please send for diabetic eye exam done earlier this year , possibly Feb , by Dr Jorja Loa  Back pain treatment options  Are limited, take tylenol one twice daily for 5 days , then as needed, and I have prescribed 5 days of prednisone   Blood sugar is excellent, keep up the great work!  It is important that you exercise regularly at least 30 minutes 5 times a week. If you develop chest pain, have severe difficulty breathing, or feel very tired, stop exercising immediately and seek medical attention  Think about what you will eat, plan ahead. Choose " clean, green, fresh or frozen" over canned, processed or packaged foods which are more sugary, salty and fatty. 70 to 75% of food eaten should be vegetables and fruit. Three meals at set times with snacks allowed between meals, but they must be fruit or vegetables. Aim to eat over a 12 hour period , example 7 am to 7 pm, and STOP after  your last meal of the day. Drink water,generally about 64 ounces per day, no other drink is as healthy. Fruit juice is best enjoyed in a healthy way, by EATING the fruit.   Back Exercises These exercises help to make your trunk and back strong. They also help to keep the lower back flexible. Doing these exercises can help to prevent back pain or lessen existing pain.  If you have back pain, try to do these exercises 2-3 times each day or as told by your doctor.  As you get better, do the exercises once each day. Repeat the exercises more often as told by your doctor.  To stop back pain from coming back, do the exercises once each day, or as told by your doctor. Exercises Single knee to chest Do these steps 3-5 times in a row for each leg: 1. Lie on your back on a firm bed or the floor with your legs stretched out. 2. Bring one knee to your chest. 3. Grab your knee or thigh with both hands and hold them it in  place. 4. Pull on your knee until you feel a gentle stretch in your lower back or buttocks. 5. Keep doing the stretch for 10-30 seconds. 6. Slowly let go of your leg and straighten it. Pelvic tilt Do these steps 5-10 times in a row: 1. Lie on your back on a firm bed or the floor with your legs stretched out. 2. Bend your knees so they point up to the ceiling. Your feet should be flat on the floor. 3. Tighten your lower belly (abdomen) muscles to press your lower back against the floor. This will make your tailbone point up to the ceiling instead of pointing down to your feet or the floor. 4. Stay in this position for 5-10 seconds while you gently tighten your muscles and breathe evenly. Cat-cow Do these steps until your lower back bends more easily: 1. Get on your hands and knees on a firm surface. Keep your hands under your shoulders, and keep your knees under your hips. You may put padding under your knees. 2. Let your head hang down toward your chest. Tighten (contract) the muscles in your belly. Point your tailbone toward the floor so your lower back becomes rounded like the back of a cat. 3. Stay in this position for 5 seconds. 4. Slowly lift your head. Let the muscles  of your belly relax. Point your tailbone up toward the ceiling so your back forms a sagging arch like the back of a cow. 5. Stay in this position for 5 seconds.  Press-ups Do these steps 5-10 times in a row: 1. Lie on your belly (face-down) on the floor. 2. Place your hands near your head, about shoulder-width apart. 3. While you keep your back relaxed and keep your hips on the floor, slowly straighten your arms to raise the top half of your body and lift your shoulders. Do not use your back muscles. You may change where you place your hands in order to make yourself more comfortable. 4. Stay in this position for 5 seconds. 5. Slowly return to lying flat on the floor.  Bridges Do these steps 10 times in a row: 1. Lie  on your back on a firm surface. 2. Bend your knees so they point up to the ceiling. Your feet should be flat on the floor. Your arms should be flat at your sides, next to your body. 3. Tighten your butt muscles and lift your butt off the floor until your waist is almost as high as your knees. If you do not feel the muscles working in your butt and the back of your thighs, slide your feet 1-2 inches farther away from your butt. 4. Stay in this position for 3-5 seconds. 5. Slowly lower your butt to the floor, and let your butt muscles relax. If this exercise is too easy, try doing it with your arms crossed over your chest. Belly crunches Do these steps 5-10 times in a row: 1. Lie on your back on a firm bed or the floor with your legs stretched out. 2. Bend your knees so they point up to the ceiling. Your feet should be flat on the floor. 3. Cross your arms over your chest. 4. Tip your chin a little bit toward your chest but do not bend your neck. 5. Tighten your belly muscles and slowly raise your chest just enough to lift your shoulder blades a tiny bit off of the floor. Avoid raising your body higher than that, because it can put too much stress on your low back. 6. Slowly lower your chest and your head to the floor. Back lifts Do these steps 5-10 times in a row: 1. Lie on your belly (face-down) with your arms at your sides, and rest your forehead on the floor. 2. Tighten the muscles in your legs and your butt. 3. Slowly lift your chest off of the floor while you keep your hips on the floor. Keep the back of your head in line with the curve in your back. Look at the floor while you do this. 4. Stay in this position for 3-5 seconds. 5. Slowly lower your chest and your face to the floor. Contact a doctor if:  Your back pain gets a lot worse when you do an exercise.  Your back pain does not get better 2 hours after you exercise. If you have any of these problems, stop doing the exercises. Do  not do them again unless your doctor says it is okay. Get help right away if:  You have sudden, very bad back pain. If this happens, stop doing the exercises. Do not do them again unless your doctor says it is okay. This information is not intended to replace advice given to you by your health care provider. Make sure you discuss any questions you have with your health care  provider. Document Revised: 11/11/2017 Document Reviewed: 11/11/2017 Elsevier Patient Education  2020 [employer].

## 2019-12-23 ENCOUNTER — Encounter: Payer: Self-pay | Admitting: Family Medicine

## 2019-12-23 NOTE — Assessment & Plan Note (Signed)

## 2019-12-23 NOTE — Assessment & Plan Note (Signed)
Current flare, 5 day course of prednisone and tylenol daily

## 2020-01-02 ENCOUNTER — Encounter: Payer: Self-pay | Admitting: Internal Medicine

## 2020-01-19 ENCOUNTER — Other Ambulatory Visit: Payer: Self-pay | Admitting: Gastroenterology

## 2020-01-19 ENCOUNTER — Other Ambulatory Visit: Payer: Self-pay | Admitting: "Endocrinology

## 2020-01-26 ENCOUNTER — Other Ambulatory Visit: Payer: Self-pay | Admitting: "Endocrinology

## 2020-01-26 DIAGNOSIS — E1165 Type 2 diabetes mellitus with hyperglycemia: Secondary | ICD-10-CM

## 2020-02-03 ENCOUNTER — Other Ambulatory Visit: Payer: Self-pay | Admitting: Family Medicine

## 2020-02-14 ENCOUNTER — Other Ambulatory Visit: Payer: Self-pay

## 2020-02-14 ENCOUNTER — Ambulatory Visit (INDEPENDENT_AMBULATORY_CARE_PROVIDER_SITE_OTHER): Payer: Medicare Other | Admitting: Internal Medicine

## 2020-02-14 ENCOUNTER — Encounter: Payer: Self-pay | Admitting: Internal Medicine

## 2020-02-14 VITALS — BP 150/75 | HR 97 | Temp 96.8°F | Ht 67.0 in | Wt 226.2 lb

## 2020-02-14 DIAGNOSIS — K5900 Constipation, unspecified: Secondary | ICD-10-CM | POA: Diagnosis not present

## 2020-02-14 DIAGNOSIS — K219 Gastro-esophageal reflux disease without esophagitis: Secondary | ICD-10-CM

## 2020-02-14 NOTE — Progress Notes (Signed)
Referring Provider: Fayrene Helper, MD Primary Care Physician:  Fayrene Helper, MD Primary GI:  Dr. Abbey Chatters  Chief Complaint  Patient presents with  . Gastroesophageal Reflux    Doing ok    HPI:   Stacey Hanson is a 74 y.o. female who presents to the clinic today for follow-up visit. Has chronic reflux which is well controlled on omeprazole. Denies any dysphagia odynophagia. No epigastric pain. Does note chronic constipation which she recently started milk of magnesia. No melena hematochezia. Last colonoscopy 2019 unremarkable. Had one small polyp removed which was benign.  Past Medical History:  Diagnosis Date  . BMI 30.0-30.9,adult 2012 215 LBS   2004 198 LBS  . Diabetes mellitus   . 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 GERD (gastroesophageal reflux disease)   . H. pylori infection 09/24/2010  . Hyperlipidemia   . Hypertension   . Hypothyroidism   . 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 Knee pain   . Obesity     Past Surgical History:  Procedure Laterality Date  . ABDOMINAL HYSTERECTOMY  1970  . CATARACT EXTRACTION W/PHACO Left 03/09/2019   Procedure: CATARACT EXTRACTION PHACO AND INTRAOCULAR LENS PLACEMENT (IOC);  Surgeon: Baruch Goldmann, MD;  Location: [facility];  Service: Ophthalmology;  Laterality: Left;  CDE: 9.08  . CATARACT EXTRACTION W/PHACO Right 03/27/2019   Procedure: CATARACT EXTRACTION PHACO AND INTRAOCULAR LENS PLACEMENT RIGHT EYE;  Surgeon: Baruch Goldmann, MD;  Location: [facility];  Service: Ophthalmology;  Laterality: Right;  CDE: 7.47  . CHOLECYSTECTOMY  OCT 2010 BZ BILIARY DYSKINESIA   CHRONIC CHOLECYSTITIS  . COLONOSCOPY  MAY 2009 SCREENING   pTC TICS, SML IH  . COLONOSCOPY N/A 08/11/2017   Procedure: COLONOSCOPY;  Surgeon: Danie Binder, MD;  Location: [facility];  Service: Endoscopy;  Laterality: N/A;  9:30am  . ESOPHAGOGASTRODUODENOSCOPY  10/03/2010   Procedure: ESOPHAGOGASTRODUODENOSCOPY (EGD);   Surgeon: Dorothyann Peng, MD;  Location: [facility];  Service: Endoscopy;  Laterality: N/A;  . ESOPHAGOGASTRODUODENOSCOPY  12/19/2002   RMR: Normal esophagus/Normal stomach/ Duodenum large bulbar diverticulum, 1 cm bulbar ulcer with surrounding/ inflammation as described above. Normal D2  . POLYPECTOMY  08/11/2017   Procedure: POLYPECTOMY;  Surgeon: Danie Binder, MD;  Location: [facility];  Service: Endoscopy;;  cecal   . SAVORY DILATION  10/03/2010   Procedure: SAVORY DILATION;  Surgeon: Dorothyann Peng, MD;  Location: [facility];  Service: Endoscopy;;  . TOTAL ABDOMINAL HYSTERECTOMY  1970  . TOTAL KNEE ARTHROPLASTY Left 12/21/2017   Procedure: LEFT TOTAL KNEE ARTHROPLASTY;  Surgeon: Paralee Cancel, MD;  Location: [facility];  Service: Orthopedics;  Laterality: Left;  70 mins block  . UPPER GASTROINTESTINAL ENDOSCOPY  OCT 2004 RMR   DUO TIC, & ULCER    Current Outpatient Medications  Medication Sig Dispense Refill  . aspirin EC 81 MG tablet Take 81 mg by mouth daily.    . B-D UF III MINI PEN NEEDLES 31G X 5 MM MISC USE FOR TWICE DAILY INJECTIONS OF HUMALOG 100 each 6  . B-D ULTRAFINE III SHORT PEN 31G X 8 MM MISC USE TO INJECT INSULIN TWICE DAILY 100 each 0  . benazepril (LOTENSIN) 40 MG tablet TAKE 1 TABLET(40 MG) BY MOUTH DAILY 30 tablet 3  . Blood Glucose Monitoring Suppl (ONE TOUCH ULTRA 2) w/Device KIT Twice daily testing dx e11.65 1 kit 0  . Calcium Carb-Cholecalciferol (  CALCIUM 600/VITAMIN D3 PO) Take 2 tablets by mouth daily.    Marland Kitchen glipiZIDE (GLUCOTROL XL) 5 MG 24 hr tablet TAKE 1 TABLET(5 MG) BY MOUTH DAILY WITH BREAKFAST 90 tablet 0  . hydrochlorothiazide (HYDRODIURIL) 25 MG tablet TAKE 1 TABLET(25 MG) BY MOUTH DAILY 90 tablet 3  . Insulin Lispro Prot & Lispro (HUMALOG MIX 75/25 KWIKPEN) (75-25) 100 UNIT/ML Kwikpen INJECT 60 UNITS UNDER THE SKIN WITH BREAKFAST AND 60 UNITS WITH SUPPER WHEN GLUCOSE IS ABOVE 90 MG/DL 30 mL 2  . Lancets (ONETOUCH DELICA PLUS [id number]) MISC USE 1  LANCET  TO CHECK GLUCOSE TWICE DAILY AS DIRECTED 100 each 5  . levothyroxine (SYNTHROID) 88 MCG tablet TAKE 1 TABLET(88 MCG) BY MOUTH DAILY BEFORE BREAKFAST 90 tablet 1  . metFORMIN (GLUCOPHAGE) 500 MG tablet TAKE 1 TABLET(500 MG) BY MOUTH TWICE DAILY WITH A MEAL 180 tablet 0  . Multiple Vitamin (MULTIVITAMIN) capsule Take 1 capsule by mouth daily.    Marland Kitchen omeprazole (PRILOSEC) 20 MG capsule TAKE 1 CAPSULE BY MOUTH EVERY MORNING 90 capsule 3  . ONETOUCH ULTRA test strip USE 1 STRIP TO CHECK GLUCOSE TWICE DAILY 100 each 5  . pravastatin (PRAVACHOL) 40 MG tablet TAKE 1 TABLET(40 MG) BY MOUTH DAILY 90 tablet 1  . UNABLE TO FIND Diabetic shoes x 1 pair, Inserts x 3 pair DX E11.9 1 each 0   No current facility-administered medications for this visit.    Allergies as of 02/14/2020 - Review Complete 02/14/2020  Allergen Reaction Noted  . Simvastatin Other (See Comments) 05/01/2010    Family History  Problem Relation Age of Onset  . Diabetes Mother   . Hypertension Mother   . Heart failure Mother   . Cancer Father 78       prostate and colon  . Diabetes Sister   . Hypertension Sister   . Hypertension Sister   . Diabetes Sister   . Diabetes Brother   . Hypertension Brother   . Diabetes Brother   . Hypertension Brother   . Coronary artery disease Brother 35       MI  . Colon cancer Neg Hx   . Colon polyps Neg Hx     Social History   Socioeconomic History  . Marital status: Single    Spouse name: Not on file  . Number of children: 3  . Years of education: 10  . Highest education level: 10th grade  Occupational History  . Occupation: [employer]  . Occupation: Retired   Tobacco Use  . Smoking status: Former Smoker    Packs/day: 0.25    Years: 10.00    Pack years: 2.50    Types: Cigarettes    Quit date: 02/20/1985    Years since quitting: 35.0  . Smokeless tobacco: Never Used  Vaping Use  . Vaping Use: Never used  Substance and Sexual Activity  . Alcohol use: No  . Drug use:  No  . Sexual activity: Not Currently  Other Topics Concern  . Not on file  Social History Narrative   Lives alone    Social Determinants of Health   Financial Resource Strain: Not on file  Food Insecurity: Not on file  Transportation Needs: Not on file  Physical Activity: Not on file  Stress: Not on file  Social Connections: Not on file    Subjective: Review of Systems  Constitutional: Negative for chills and fever.  HENT: Negative for congestion and hearing loss.   Eyes: Negative for blurred vision and double  vision.  Respiratory: Negative for cough and shortness of breath.   Cardiovascular: Negative for chest pain and palpitations.  Gastrointestinal: Positive for constipation and heartburn. Negative for abdominal pain, blood in stool, diarrhea, melena and vomiting.  Genitourinary: Negative for dysuria and urgency.  Musculoskeletal: Negative for joint pain and myalgias.  Skin: Negative for itching and rash.  Neurological: Negative for dizziness and headaches.  Psychiatric/Behavioral: Negative for depression. The patient is not nervous/anxious.      Objective: BP (!) 150/75   Pulse 97   Temp (!) 96.8 F (36 C) (Temporal)   Ht '5\' 7"'  (1.702 m)   Wt 226 lb 3.2 oz (102.6 kg)   BMI 35.43 kg/m  Physical Exam Constitutional:      Appearance: Normal appearance.  HENT:     Head: Normocephalic and atraumatic.  Eyes:     Extraocular Movements: Extraocular movements intact.     Conjunctiva/sclera: Conjunctivae normal.  Cardiovascular:     Rate and Rhythm: Normal rate and regular rhythm.  Pulmonary:     Effort: Pulmonary effort is normal.     Breath sounds: Normal breath sounds.  Abdominal:     General: Bowel sounds are normal.     Palpations: Abdomen is soft.  Musculoskeletal:        General: No swelling. Normal range of motion.     Cervical back: Normal range of motion and neck supple.  Skin:    General: Skin is warm and dry.     Coloration: Skin is not jaundiced.   Neurological:     General: No focal deficit present.     Mental Status: She is alert and oriented to person, place, and time.  Psychiatric:        Mood and Affect: Mood normal.        Behavior: Behavior normal.      Assessment: *Chronic reflux-well-controlled on omeprazole *Constipation-worsening  Plan: Patient's chronic reflux well controlled on omeprazole. We'll continue. Regards to her constipation this appears to be a new issue, worsening. I recommended that she start MiraLAX 17 g daily and increase to twice daily as needed. She is already on fiber therapy with Metamucil. Recommend she continue this as well ensure she drinks at least 4 to 6 glasses of water daily.  Patient follow-up in 1 year or sooner if needed.  02/14/2020 3:57 PM   Disclaimer: This note was dictated with voice recognition software. Similar sounding words can inadvertently be transcribed and may not be corrected upon review.

## 2020-02-14 NOTE — Patient Instructions (Signed)
Continue on omeprazole daily for your chronic reflux. Call for any refills.  Recommend starting MiraLAX (polyethylene glycol) 17 g daily. This is equivalent to one capful. You can go up to two doses daily if need be.  Continue on fiber supplementation. Be Sure to drink at least 4 to 6 glasses of water daily.  Follow-up in 1 year or sooner if needed.  At [facility] we value your feedback. You may receive a survey about your visit today. Please share your experience as we strive to create trusting relationships with our patients to provide genuine, compassionate, quality care.  We appreciate your understanding and patience as we review any laboratory studies, imaging, and other diagnostic tests that are ordered as we care for you. Our office policy is 5 business days for review of these results, and any emergent or urgent results are addressed in a timely manner for your best interest. If you do not hear from our office in 1 week, please contact us.   We also encourage the use of MyChart, which contains your medical information for your review as well. If you are not enrolled in this feature, an access code is on this after visit summary for your convenience. Thank you for allowing [place] to be involved in your care.  It was great to see you today!  I hope you have a great rest of your winter!!    Stacey Hanson. Abbey Chatters, D.O. Gastroenterology and Hepatology [facility]

## 2020-02-15 ENCOUNTER — Encounter: Payer: Self-pay | Admitting: Family Medicine

## 2020-02-15 ENCOUNTER — Ambulatory Visit (INDEPENDENT_AMBULATORY_CARE_PROVIDER_SITE_OTHER): Payer: Medicare Other | Admitting: Family Medicine

## 2020-02-15 VITALS — BP 130/72 | Ht 67.0 in | Wt 221.0 lb

## 2020-02-15 DIAGNOSIS — Z0000 Encounter for general adult medical examination without abnormal findings: Secondary | ICD-10-CM | POA: Diagnosis not present

## 2020-02-15 NOTE — Progress Notes (Addendum)
Subjective:   Stacey Hanson is a 74 y.o. female who presents for Medicare Annual (Subsequent) preventive examination.  Participants: Nurse for intake and work up; Patient and Provider for Visit and Wrap up  Method of visit: Telephone  Location of Patient: Home Location of Provider: Office Consent was obtain for visit over the telephone. Services rendered by provider: Visit was performed via telephone   I verified that I am speaking with the correct person using two identifiers.   Review of Systems Cardiac Risk Factors include: advanced age (>52mn, >>70women);diabetes mellitus     Objective:    Today's Vitals   02/15/20 0958  BP: 130/72  Weight: 221 lb (100.2 kg)  Height: _0  (1.702 m)  PainSc: 0-No pain   Body mass index is 34.61 kg/m.  Advanced Directives 02/15/2020 03/27/2019 03/09/2019 03/07/2019 02/14/2018 02/09/2018 01/20/2018  Does Patient Have a Medical Advance Directive? _1  No No  Would patient like information on creating a medical advance directive? No - Patient declined No - Patient declined No - Patient declined No - Patient declined Yes (ED - Information included in AVS) Yes (ED - Information included in AVS) No - Patient declined  Pre-existing out of facility DNR order (yellow form or pink MOST form) - - - - - - -    Current Medications (verified) Outpatient Encounter Medications as of 02/15/2020  Medication Sig   aspirin EC 81 MG tablet Take 81 mg by mouth daily.   B-D UF III MINI PEN NEEDLES 31G X 5 MM MISC USE FOR TWICE DAILY INJECTIONS OF HUMALOG   B-D ULTRAFINE III SHORT PEN 31G X 8 MM MISC USE TO INJECT INSULIN TWICE DAILY   benazepril (LOTENSIN) 40 MG tablet TAKE 1 TABLET(40 MG) BY MOUTH DAILY   Blood Glucose Monitoring Suppl (ONE TOUCH ULTRA 2) w/Device KIT Twice daily testing dx e11.65   Calcium Carb-Cholecalciferol (CALCIUM 600/VITAMIN D3 PO) Take 2 tablets by mouth daily.   glipiZIDE (GLUCOTROL XL) 5 MG 24 hr tablet TAKE 1  TABLET(5 MG) BY MOUTH DAILY WITH BREAKFAST   hydrochlorothiazide (HYDRODIURIL) 25 MG tablet TAKE 1 TABLET(25 MG) BY MOUTH DAILY   Insulin Lispro Prot & Lispro (HUMALOG MIX 75/25 KWIKPEN) (75-25) 100 UNIT/ML Kwikpen INJECT 60 UNITS UNDER THE SKIN WITH BREAKFAST AND 60 UNITS WITH SUPPER WHEN GLUCOSE IS ABOVE 90 MG/DL   Lancets (ONETOUCH DELICA PLUS [id number] MISC USE 1 LANCET  TO CHECK GLUCOSE TWICE DAILY AS DIRECTED   levothyroxine (SYNTHROID) 88 MCG tablet TAKE 1 TABLET(88 MCG) BY MOUTH DAILY BEFORE BREAKFAST   metFORMIN (GLUCOPHAGE) 500 MG tablet TAKE 1 TABLET(500 MG) BY MOUTH TWICE DAILY WITH A MEAL   Multiple Vitamin (MULTIVITAMIN) capsule Take 1 capsule by mouth daily.   omeprazole (PRILOSEC) 20 MG capsule TAKE 1 CAPSULE BY MOUTH EVERY MORNING   ONETOUCH ULTRA test strip USE 1 STRIP TO CHECK GLUCOSE TWICE DAILY   pravastatin (PRAVACHOL) 40 MG tablet TAKE 1 TABLET(40 MG) BY MOUTH DAILY   UNABLE TO FIND Diabetic shoes x 1 pair, Inserts x 3 pair DX E11.9   No facility-administered encounter medications on file as of 02/15/2020.    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    Knee pain    Obesity    Past Surgical History:  Procedure Laterality Date   ABDOMINAL HYSTERECTOMY  1970   CATARACT EXTRACTION W/PHACO Left 03/09/2019   Procedure: CATARACT EXTRACTION PHACO AND INTRAOCULAR LENS PLACEMENT ([place]);  Surgeon: Baruch Goldmann, MD;  Location: [facility];  Service: Ophthalmology;  Laterality: Left;  CDE: 9.08   CATARACT EXTRACTION W/PHACO Right 03/27/2019   Procedure: CATARACT EXTRACTION PHACO AND INTRAOCULAR LENS  PLACEMENT RIGHT EYE;  Surgeon: Baruch Goldmann, MD;  Location: [facility];  Service: Ophthalmology;  Laterality: Right;  CDE: 7.47   CHOLECYSTECTOMY  OCT 2010 BZ BILIARY DYSKINESIA   CHRONIC CHOLECYSTITIS   COLONOSCOPY  MAY 2009 SCREENING   pTC TICS, [facility]   COLONOSCOPY N/A 08/11/2017   Procedure: COLONOSCOPY;  Surgeon: Danie Binder, MD;  Location: [facility];  Service: Endoscopy;  Laterality: N/A;  9:30am   ESOPHAGOGASTRODUODENOSCOPY  10/03/2010   Procedure: ESOPHAGOGASTRODUODENOSCOPY (EGD);  Surgeon: Dorothyann Peng, MD;  Location: [facility];  Service: Endoscopy;  Laterality: N/A;   ESOPHAGOGASTRODUODENOSCOPY  12/19/2002   RMR: Normal esophagus/Normal stomach/ Duodenum large bulbar diverticulum, 1 cm bulbar ulcer with surrounding/ inflammation as described above. Normal D2   POLYPECTOMY  08/11/2017   Procedure: POLYPECTOMY;  Surgeon: Danie Binder, MD;  Location: [facility];  Service: Endoscopy;;  cecal    SAVORY DILATION  10/03/2010   Procedure: SAVORY DILATION;  Surgeon: Dorothyann Peng, MD;  Location: [facility];  Service: Endoscopy;;   TOTAL ABDOMINAL HYSTERECTOMY  1970   TOTAL KNEE ARTHROPLASTY Left 12/21/2017   Procedure: LEFT TOTAL KNEE ARTHROPLASTY;  Surgeon: Paralee Cancel, MD;  Location: [facility];  Service: Orthopedics;  Laterality: Left;  70 mins block   UPPER GASTROINTESTINAL ENDOSCOPY  OCT 2004 RMR   DUO TIC, & ULCER   Family History  Problem Relation Age of Onset   Diabetes Mother    Hypertension Mother    Heart failure Mother    Cancer Father 79       prostate and colon   Diabetes Sister    Hypertension Sister    Hypertension Sister    Diabetes Sister    Diabetes Brother    Hypertension Brother    Diabetes Brother    Hypertension Brother    Coronary artery disease Brother 69       MI   Colon cancer Neg Hx    Colon polyps Neg Hx    Social History   Socioeconomic History   Marital status: Single    Spouse name: Not on file    Number of children: 3   Years of education: 10   Highest education level: 10th grade  Occupational History   Occupation: [employer]-OR   Occupation: Retired   Tobacco Use   Smoking status: Former Smoker    Packs/day: 0.25    Years: 10.00    Pack years: 2.50    Types: Cigarettes    Quit date: 02/20/1985    Years since quitting: 35.0   Smokeless tobacco: Never Used  Vaping Use   Vaping Use: Never used  Substance and Sexual Activity   Alcohol use: No   Drug use: No   Sexual activity: Not Currently  Other Topics Concern   Not on file  Social History Narrative   Lives alone    Social Determinants of Health  Financial Resource Strain: Low Risk    Difficulty of Paying Living Expenses: Not hard at all  Food Insecurity: No Food Insecurity   Worried About Charity fundraiser in the Last Year: Never true   Ran Out of Food in the Last Year: Never true  Transportation Needs: No Transportation Needs   Lack of Transportation (Medical): No   Lack of Transportation (Non-Medical): No  Physical Activity: Insufficiently Active   Days of Exercise per Week: 2 days   Minutes of Exercise per Session: 10 min  Stress: No Stress Concern Present   Feeling of Stress : Not at all  Social Connections: Socially Isolated   Frequency of Communication with Friends and Family: More than three times a week   Frequency of Social Gatherings with Friends and Family: More than three times a week   Attends Religious Services: Never   Marine scientist or Organizations: No   Attends Music therapist: Never   Marital Status: Never married    Tobacco Counseling Counseling given: Yes   Clinical Intake:  Pre-visit preparation completed: Yes  Pain : No/denies pain Pain Score: 0-No pain     BMI - recorded: 34.62 Nutritional Status: BMI > 30  Obese Nutritional Risks: None Diabetes: Yes CBG done?: No Did pt. bring in CBG monitor from home?: No  How often do you  need to have someone help you when you read instructions, pamphlets, or other written materials from your doctor or pharmacy?: 1 - Never What is the last grade level you completed in school?: 10  Diabetic? yes     Information entered by :: [place] of Daily Living In your present state of health, do you have any difficulty performing the following activities: 02/15/2020 03/07/2019  Hearing? N N  Vision? N Y  Difficulty concentrating or making decisions? N N  Walking or climbing stairs? Y Y  Comment - knee stiffness at times  Dressing or bathing? N N  Doing errands, shopping? N N  Preparing Food and eating ? N -  Using the Toilet? N -  In the past six months, have you accidently leaked urine? N -  Do you have problems with loss of bowel control? N -  Managing your Medications? N -  Managing your Finances? N -  Housekeeping or managing your Housekeeping? N -  Some recent data might be hidden    Patient Care Team: Fayrene Helper, MD as PCP - General [place] [facility] Lenise Herald, MD (Inactive) as PCP - Cardiology (Cardiology) Danie Binder, MD (Inactive) (Gastroenterology) Leta Baptist, MD as Attending Physician (Otolaryngology) Cassandria Anger, MD (Endocrinology) Carole Civil, MD as Consulting Physician (Orthopedic Surgery) Madelin Headings, DO (Optometry) Bernell List, CPhT as [facility] (Pharmacy Technician) Eloise Harman, DO as Consulting Physician (Internal Medicine)  Indicate any recent Medical Services you may have received from other than [facility] providers in the past year (date may be approximate).     Assessment:   This is a routine wellness examination for Dovie.  Hearing/Vision screen No exam data present  Dietary issues and exercise activities discussed: Current Exercise Habits: Home exercise routine, Time (Minutes): 10, Frequency (Times/Week): 2, Weekly Exercise (Minutes/Week): 20, Intensity:  Mild  Goals     Increase physical activity     Patient Stated     Want to get my knees well       Depression Screen [facility] 2/9 Scores 02/15/2020 12/21/2019 05/25/2019  02/14/2019 11/23/2018 11/23/2018 06/22/2018  PHQ - 2 Score 0 0 0 1 1 0 0  PHQ- 9 Score - - - - - - -    Fall Risk Fall Risk  02/15/2020 12/21/2019 09/14/2019 05/25/2019 02/14/2019  Falls in the past year? 0 0 0 0 0  Number falls in past yr: 0 - 0 0 0  Injury with Fall? 0 - 0 0 0  Risk for fall due to : No Fall Risks - - - History of fall(s)  Follow up Falls evaluation completed - - Falls evaluation completed Falls evaluation completed;Education provided;Falls prevention discussed    FALL RISK PREVENTION PERTAINING TO THE HOME:   Any stairs in or around the home? Yes  If so, are there any without handrails? No  Home free of loose throw rugs in walkways, pet beds, electrical cords, etc? Yes  Adequate lighting in your home to reduce risk of falls? Yes   ASSISTIVE DEVICES UTILIZED TO PREVENT FALLS:  Life alert? Yes  Use of a cane, walker or w/c? Yes  Grab bars in the bathroom? Yes  Shower chair or bench in shower? Yes  Elevated toilet seat or a handicapped toilet? No   TIMED UP AND GO:  Was the test performed? No .  Length of time to ambulate n/a   Cognitive Function:     6CIT Screen 02/15/2020 02/14/2019 02/09/2018 02/25/2017 02/25/2016  What Year? 0 points 0 points 0 points 0 points 0 points  What month? 0 points 0 points 0 points 0 points 0 points  What time? 0 points 0 points 0 points 0 points 0 points  Count back from 20 0 points 0 points 0 points 0 points 0 points  Months in reverse 0 points 0 points 0 points 0 points 0 points  Repeat phrase 0 points 0 points 0 points 0 points 0 points  Total Score 0 0 0 0 0    Immunizations Immunization History  Administered Date(s) Administered   Fluad Quad(high Dose 65+) 11/23/2018, 12/21/2019   Influenza,inj,Quad PF,6+ Mos 11/16/2012, 11/15/2013, 11/14/2014,  11/14/2015, 11/03/2016, 11/09/2017   Influenza,inj,quad, With Preservative 11/02/2016   Moderna Sars-Covid-2 Vaccination 04/27/2019, 05/26/2019   Pneumococcal Conjugate-13 10/11/2013   Pneumococcal Polysaccharide-23 10/07/2011   Zoster 04/05/2006    TDAP status: Due, Education has been provided regarding the importance of this vaccine. Advised may receive this vaccine at local pharmacy or Health Dept. Aware to provide a copy of the vaccination record if obtained from local pharmacy or Health Dept. Verbalized acceptance and understanding.  Flu Vaccine status: Up to date  Pneumococcal vaccine status: Up to date  Covid-19 vaccine status: Completed vaccines  Qualifies for Shingles Vaccine? Yes   Zostavax completed No   Shingrix Completed?: No.    Education has been provided regarding the importance of this vaccine. Patient has been advised to call insurance company to determine out of pocket expense if they have not yet received this vaccine. Advised may also receive vaccine at local pharmacy or Health Dept. Verbalized acceptance and understanding.  Screening Tests Health Maintenance  Topic Date Due   COVID-19 Vaccine (3 - Booster for Moderna series) 11/26/2019   OPHTHALMOLOGY EXAM  12/19/2019   TETANUS/TDAP  12/18/2020 (Originally 08/01/2019)   FOOT EXAM  05/24/2020   HEMOGLOBIN A1C  06/17/2020   MAMMOGRAM  08/13/2021   COLONOSCOPY  08/12/2027   INFLUENZA VACCINE  Completed   DEXA SCAN  Completed   Hepatitis C Screening  Completed   PNA vac Low Risk  Adult  Completed    Health Maintenance  Health Maintenance Due  Topic Date Due   COVID-19 Vaccine (3 - Booster for Moderna series) 11/26/2019   OPHTHALMOLOGY EXAM  12/19/2019    Colorectal cancer screening: Type of screening: Colonoscopy. Completed 08/11/17. Repeat every 10 years  Mammogram status: Completed 08/14/19. Repeat every year  Bone Density status: Completed 03/04/17. Results reflect: Bone density results:  NORMAL. Repeat every 5 years.  Lung Cancer Screening: (Low Dose CT Chest recommended if Age 62-80 years, 30 pack-year currently smoking OR have quit w/in 15years.) does not qualify.   Lung Cancer Screening Referral: n/a  Additional Screening:  Hepatitis C Screening: does not qualify  Vision Screening: Recommended annual ophthalmology exams for early detection of glaucoma and other disorders of the eye. Is the patient up to date with their annual eye exam?  Yes  Who is the provider or what is the name of the office in which the patient attends annual eye exams? Dr. Jorja Loa If pt is not established with a provider, would they like to be referred to a provider to establish care? n/a.   Dental Screening: Recommended annual dental exams for proper oral hygiene  Community Resource Referral / Chronic Care Management: CRR required this visit?  No   CCM required this visit?  No      Plan:      1. Encounter for Medicare annual wellness exam   I have personally reviewed and noted the following in the patients chart:    Medical and social history  Use of alcohol, tobacco or illicit drugs   Current medications and supplements  Functional ability and status  Nutritional status  Physical activity  Advanced directives  List of other physicians  Hospitalizations, surgeries, and ER visits in previous 12 months  Vitals  Screenings to include cognitive, depression, and falls  Referrals and appointments  In addition, I have reviewed and discussed with patient certain preventive protocols, quality metrics, and best practice recommendations. A written personalized care plan for preventive services as well as general preventive health recommendations were provided to patient.     Agreed with above documentation    Perlie Mayo, NP   02/15/2020   Nurse Notes: AWV conducted by nurse in office by phone. Patient gave consent to telehealth visit via audio. Patient at home at  time of visit. Provider here in the office. Visit took 30 minutes to complete.

## 2020-02-15 NOTE — Patient Instructions (Addendum)
Stacey Hanson , Thank you for taking time to come for your Medicare Wellness Visit. I appreciate your ongoing commitment to your health goals. Please review the following plan we discussed and let me know if I can assist you in the future.   Please continue to practice social distancing to keep you, your family, and our community safe.  If you must go out, please wear a Mask and practice good handwashing.  Merry Christmas and Happy New Year!  Screening recommendations/referrals: Colonoscopy: 08/12/27 Mammogram: 08/13/21 Bone Density: Complete Recommended yearly ophthalmology/optometry visit for glaucoma screening and checkup Recommended yearly dental visit for hygiene and checkup  Vaccinations: Influenza vaccine: Fall 2022 Pneumococcal vaccine: Complete Tdap vaccine: 12/18/20 Shingles vaccine: Declined    Advanced directives: No  Conditions/risks identified: None  Next appointment: 06/19/20 @ 8 am   Preventive Care 65 Years and Older, Female Preventive care refers to lifestyle choices and visits with your health care provider that can promote health and wellness. What does preventive care include?  A yearly physical exam. This is also called an annual well check.  Dental exams once or twice a year.  Routine eye exams. Ask your health care provider how often you should have your eyes checked.  Personal lifestyle choices, including:  Daily care of your teeth and gums.  Regular physical activity.  Eating a healthy diet.  Avoiding tobacco and drug use.  Limiting alcohol use.  Practicing safe sex.  Taking low-dose aspirin every day.  Taking vitamin and mineral supplements as recommended by your health care provider. What happens during an annual well check? The services and screenings done by your health care provider during your annual well check will depend on your age, overall health, lifestyle risk factors, and family history of disease. Counseling  Your health  care provider may ask you questions about your:  Alcohol use.  Tobacco use.  Drug use.  Emotional well-being.  Home and relationship well-being.  Sexual activity.  Eating habits.  History of falls.  Memory and ability to understand (cognition).  Work and work Statistician.  Reproductive health. Screening  You may have the following tests or measurements:  Height, weight, and BMI.  Blood pressure.  Lipid and cholesterol levels. These may be checked every 5 years, or more frequently if you are over 53 years old.  Skin check.  Lung cancer screening. You may have this screening every year starting at age 6 if you have a 30-pack-year history of smoking and currently smoke or have quit within the past 15 years.  Fecal occult blood test (FOBT) of the stool. You may have this test every year starting at age 42.  Flexible sigmoidoscopy or colonoscopy. You may have a sigmoidoscopy every 5 years or a colonoscopy every 10 years starting at age 12.  Hepatitis C blood test.  Hepatitis B blood test.  Sexually transmitted disease (STD) testing.  Diabetes screening. This is done by checking your blood sugar (glucose) after you have not eaten for a while (fasting). You may have this done every 1-3 years.  Bone density scan. This is done to screen for osteoporosis. You may have this done starting at age 41.  Mammogram. This may be done every 1-2 years. Talk to your health care provider about how often you should have regular mammograms. Talk with your health care provider about your test results, treatment options, and if necessary, the need for more tests. Vaccines  Your health care provider may recommend certain vaccines, such as:  Influenza  vaccine. This is recommended every year.  Tetanus, diphtheria, and acellular pertussis (Tdap, Td) vaccine. You may need a Td booster every 10 years.  Zoster vaccine. You may need this after age 51.  Pneumococcal 13-valent conjugate  (PCV13) vaccine. One dose is recommended after age 66.  Pneumococcal polysaccharide (PPSV23) vaccine. One dose is recommended after age 59. Talk to your health care provider about which screenings and vaccines you need and how often you need them. This information is not intended to replace advice given to you by your health care provider. Make sure you discuss any questions you have with your health care provider. Document Released: 03/15/2015 Document Revised: 11/06/2015 Document Reviewed: 12/18/2014 Elsevier Interactive Patient Education  2017 [employer] Prevention in the Home Falls can cause injuries. They can happen to people of all ages. There are many things you can do to make your home safe and to help prevent falls. What can I do on the outside of my home?  Regularly fix the edges of walkways and driveways and fix any cracks.  Remove anything that might make you trip as you walk through a door, such as a raised step or threshold.  Trim any bushes or trees on the path to your home.  Use bright outdoor lighting.  Clear any walking paths of anything that might make someone trip, such as rocks or tools.  Regularly check to see if handrails are loose or broken. Make sure that both sides of any steps have handrails.  Any raised decks and porches should have guardrails on the edges.  Have any leaves, snow, or ice cleared regularly.  Use sand or salt on walking paths during winter.  Clean up any spills in your garage right away. This includes oil or grease spills. What can I do in the bathroom?  Use night lights.  Install grab bars by the toilet and in the tub and shower. Do not use towel bars as grab bars.  Use non-skid mats or decals in the tub or shower.  If you need to sit down in the shower, use a plastic, non-slip stool.  Keep the floor dry. Clean up any water that spills on the floor as soon as it happens.  Remove soap buildup in the tub or shower  regularly.  Attach bath mats securely with double-sided non-slip rug tape.  Do not have throw rugs and other things on the floor that can make you trip. What can I do in the bedroom?  Use night lights.  Make sure that you have a light by your bed that is easy to reach.  Do not use any sheets or blankets that are too big for your bed. They should not hang down onto the floor.  Have a firm chair that has side arms. You can use this for support while you get dressed.  Do not have throw rugs and other things on the floor that can make you trip. What can I do in the kitchen?  Clean up any spills right away.  Avoid walking on wet floors.  Keep items that you use a lot in easy-to-reach places.  If you need to reach something above you, use a strong step stool that has a grab bar.  Keep electrical cords out of the way.  Do not use floor polish or wax that makes floors slippery. If you must use wax, use non-skid floor wax.  Do not have throw rugs and other things on the floor that can  make you trip. What can I do with my stairs?  Do not leave any items on the stairs.  Make sure that there are handrails on both sides of the stairs and use them. Fix handrails that are broken or loose. Make sure that handrails are as long as the stairways.  Check any carpeting to make sure that it is firmly attached to the stairs. Fix any carpet that is loose or worn.  Avoid having throw rugs at the top or bottom of the stairs. If you do have throw rugs, attach them to the floor with carpet tape.  Make sure that you have a light switch at the top of the stairs and the bottom of the stairs. If you do not have them, ask someone to add them for you. What else can I do to help prevent falls?  Wear shoes that:  Do not have high heels.  Have rubber bottoms.  Are comfortable and fit you well.  Are closed at the toe. Do not wear sandals.  If you use a stepladder:  Make sure that it is fully  opened. Do not climb a closed stepladder.  Make sure that both sides of the stepladder are locked into place.  Ask someone to hold it for you, if possible.  Clearly mark and make sure that you can see:  Any grab bars or handrails.  First and last steps.  Where the edge of each step is.  Use tools that help you move around (mobility aids) if they are needed. These include:  Canes.  Walkers.  Scooters.  Crutches.  Turn on the lights when you go into a dark area. Replace any light bulbs as soon as they burn out.  Set up your furniture so you have a clear path. Avoid moving your furniture around.  If any of your floors are uneven, fix them.  If there are any pets around you, be aware of where they are.  Review your medicines with your doctor. Some medicines can make you feel dizzy. This can increase your chance of falling. Ask your doctor what other things that you can do to help prevent falls. This information is not intended to replace advice given to you by your health care provider. Make sure you discuss any questions you have with your health care provider. Document Released: 12/13/2008 Document Revised: 07/25/2015 Document Reviewed: 03/23/2014 Elsevier Interactive Patient Education  2017 [employer].

## 2020-02-16 ENCOUNTER — Other Ambulatory Visit: Payer: Self-pay | Admitting: Family Medicine

## 2020-03-06 ENCOUNTER — Encounter: Payer: Self-pay | Admitting: Family Medicine

## 2020-03-06 ENCOUNTER — Other Ambulatory Visit: Payer: Self-pay

## 2020-03-06 ENCOUNTER — Ambulatory Visit (INDEPENDENT_AMBULATORY_CARE_PROVIDER_SITE_OTHER): Payer: Medicare Other | Admitting: Family Medicine

## 2020-03-06 VITALS — BP 138/82 | HR 103 | Resp 15 | Ht 67.0 in | Wt 229.1 lb

## 2020-03-06 DIAGNOSIS — E1159 Type 2 diabetes mellitus with other circulatory complications: Secondary | ICD-10-CM

## 2020-03-06 DIAGNOSIS — I10 Essential (primary) hypertension: Secondary | ICD-10-CM | POA: Diagnosis not present

## 2020-03-06 DIAGNOSIS — M5410 Radiculopathy, site unspecified: Secondary | ICD-10-CM | POA: Diagnosis not present

## 2020-03-06 MED ORDER — UNABLE TO FIND: 0 refills | Status: AC

## 2020-03-06 MED ORDER — KETOROLAC TROMETHAMINE 60 MG/2ML IM SOLN
30.0000 mg | Freq: Once | INTRAMUSCULAR | Status: AC
  Administered 2020-03-06: 30 mg via INTRAMUSCULAR

## 2020-03-06 MED ORDER — METHYLPREDNISOLONE ACETATE 80 MG/ML IJ SUSP
40.0000 mg | Freq: Once | INTRAMUSCULAR | Status: AC
  Administered 2020-03-06: 40 mg via INTRAMUSCULAR

## 2020-03-06 MED ORDER — PREDNISONE 5 MG PO TABS: 5.0000 mg | ORAL_TABLET | Freq: Two times a day (BID) | ORAL | 0 refills | Status: AC

## 2020-03-06 NOTE — Patient Instructions (Signed)
F/u as before, call if you need me sooner  Exam today does qualify you for diabetic shoes  You are referred to Podiatry for foot and toenail care  Toradol 30 m,g IM and depo Medrol 40 mg IM in office today for left sciatica, and 5 day course of prednisone is prescribed. I hope that you feel better soon   Thanks for choosing [facility], we consider it a privelige to serve you.

## 2020-03-06 NOTE — Assessment & Plan Note (Signed)
Controlled, no change in medication Ms. Buxbaum is reminded of the importance of commitment to daily physical activity for 30 minutes or more, as able and the need to limit carbohydrate intake to 30 to 60 grams per meal to help with blood sugar control.   The need to take medication as prescribed, test blood sugar as directed, and to call between visits if there is a concern that blood sugar is uncontrolled is also discussed.   Ms. Bardwell is reminded of the importance of daily foot exam, annual eye examination, and good blood sugar, blood pressure and cholesterol control.  Diabetic Labs Latest Ref Rng & Units 12/18/2019 09/15/2019 09/06/2019 05/22/2019 03/07/2019  HbA1c 4.0 - 5.6 % 7.3(A) 8.4(A) - 10.0(H) 9.8(H)  Microalbumin Not estab mg/dL - - - - -  Micro/Creat Ratio 0 - 29 mg/g creat - - - - -  Chol 100 - 199 mg/dL - - - 935 -  HDL >70 mg/dL - - - 66 -  Calc LDL 0 - 99 mg/dL - - - 95 -  Triglycerides 0 - 149 mg/dL - - - 99 -  Creatinine 0.57 - 1.00 mg/dL - - 1.77(L) 3.90(Z) 0.09(Q)   BP/Weight 03/06/2020 02/15/2020 02/14/2020 12/21/2019 12/18/2019 09/15/2019 09/14/2019  Systolic BP 138 130 150 130 168 118 130  Diastolic BP 82 72 75 72 82 76 72  Wt. (Lbs) 229.12 221 226.2 221.04 220.8 220.6 218  BMI 35.89 34.61 35.43 34.62 32.61 32.58 32.19   Foot/eye exam completion dates Latest Ref Rng & Units 03/06/2020 05/25/2019  Eye Exam No Retinopathy - -  Foot exam Order - - -  Foot Form Completion - Done Done      \

## 2020-03-06 NOTE — Progress Notes (Signed)
   Stacey Hanson     MRN: [id number]      DOB: [birth year]   HPI Stacey Hanson is here for evaluation for diabetic shoes, also 1 week h/o increased back and lLE pain rated at 9 Denies polyuria, polydipsia, blurred vision , or hypoglycemic episodes.  ROS Denies recent fever or chills. Denies sinus pressure, nasal congestion, ear pain or sore throat. Denies chest congestion, productive cough or wheezing. Denies chest pains, palpitations and leg swelling Denies abdominal pain, nausea, vomiting,diarrhea or constipation.   Denies dysuria, frequency, hesitancy or incontinence.  Denies depression, anxiety or insomnia. Denies skin break down or rash.   PE  BP 138/82   Pulse (!) 103   Resp 15   Ht 5\' 7"  (1.702 m)   Wt 229 lb 1.9 oz (103.9 kg)   SpO2 98%   BMI 35.89 kg/m   Patient alert and oriented and in no cardiopulmonary distress.  HEENT: No facial asymmetry, EOMI,     Neck supple .  Chest: Clear to auscultation bilaterally.  CVS: S1, S2 no murmurs, no S3.Regular rate.  ABD: Soft non tender.   Ext: No edema  MS: decreased ROM spine, adequate in shoulders, hips and knees.  Skin: Intact, no ulcerations or rash noted.  Psych: Good eye contact, normal affect. Memory intact not anxious or depressed appearing.  CNS: CN 2-12 intact, power,  normal throughout.no focal deficits noted.   Assessment & Plan  Type 2 diabetes mellitus with vascular disease (HCC) Controlled, no change in medication Stacey Hanson is reminded of the importance of commitment to daily physical activity for 30 minutes or more, as able and the need to limit carbohydrate intake to 30 to 60 grams per meal to help with blood sugar control.   The need to take medication as prescribed, test blood sugar as directed, and to call between visits if there is a concern that blood sugar is uncontrolled is also discussed.   Stacey Hanson is reminded of the importance of daily foot exam, annual eye examination, and good  blood sugar, blood pressure and cholesterol control.  Diabetic Labs Latest Ref Rng & Units 12/18/2019 09/15/2019 09/06/2019 05/22/2019 03/07/2019  HbA1c 4.0 - 5.6 % 7.3(A) 8.4(A) - 10.0(H) 9.8(H)  Microalbumin Not estab mg/dL - - - - -  Micro/Creat Ratio 0 - 29 mg/g creat - - - - -  Chol 100 - 199 mg/dL - - - 05/05/2019 -  HDL 101 mg/dL - - - 66 -  Calc LDL 0 - 99 mg/dL - - - 95 -  Triglycerides 0 - 149 mg/dL - - - 99 -  Creatinine 0.57 - 1.00 mg/dL - - 01-17-1999) 1.02(H) 8.52(D)   BP/Weight 03/06/2020 02/15/2020 02/14/2020 12/21/2019 12/18/2019 09/15/2019 09/14/2019  Systolic BP 138 130 150 130 168 118 130  Diastolic BP 82 72 75 72 82 76 72  Wt. (Lbs) 229.12 221 226.2 221.04 220.8 220.6 218  BMI 35.89 34.61 35.43 34.62 32.61 32.58 32.19   Foot/eye exam completion dates Latest Ref Rng & Units 03/06/2020 05/25/2019  Eye Exam No Retinopathy - -  Foot exam Order - - -  Foot Form Completion - Done Done      \   Back pain with left-sided radiculopathy Uncontrolled.Toradol 30 mg and depo medrol 40 mg  administered IM in the office , to be followed by a short course of oral prednisone   ESSENTIAL HYPERTENSION, BENIGN Controlled, no change in medication

## 2020-03-06 NOTE — Assessment & Plan Note (Signed)
Controlled, no change in medication  

## 2020-03-06 NOTE — Addendum Note (Signed)
Addended by: Abner Greenspan on: 03/06/2020 11:19 AM   Modules accepted: Orders

## 2020-03-06 NOTE — Assessment & Plan Note (Signed)
Uncontrolled.Toradol 30 mg and depo medrol 40 mg  administered IM in the office , to be followed by a short course of oral prednisone

## 2020-03-12 ENCOUNTER — Other Ambulatory Visit: Payer: Self-pay | Admitting: Family Medicine

## 2020-03-22 DIAGNOSIS — E119 Type 2 diabetes mellitus without complications: Secondary | ICD-10-CM | POA: Diagnosis not present

## 2020-03-22 DIAGNOSIS — M2140 Flat foot [pes planus] (acquired), unspecified foot: Secondary | ICD-10-CM | POA: Diagnosis not present

## 2020-03-27 ENCOUNTER — Other Ambulatory Visit: Payer: Self-pay | Admitting: Family Medicine

## 2020-04-09 ENCOUNTER — Other Ambulatory Visit: Payer: Self-pay

## 2020-04-09 DIAGNOSIS — E1159 Type 2 diabetes mellitus with other circulatory complications: Secondary | ICD-10-CM

## 2020-04-09 DIAGNOSIS — E1165 Type 2 diabetes mellitus with hyperglycemia: Secondary | ICD-10-CM

## 2020-04-09 MED ORDER — ONETOUCH ULTRA VI STRP: ORAL_STRIP | 5 refills | Status: DC

## 2020-04-09 MED ORDER — ONETOUCH DELICA PLUS LANCET33G MISC: 5 refills | Status: DC

## 2020-04-11 DIAGNOSIS — E890 Postprocedural hypothyroidism: Secondary | ICD-10-CM | POA: Diagnosis not present

## 2020-04-11 DIAGNOSIS — E1165 Type 2 diabetes mellitus with hyperglycemia: Secondary | ICD-10-CM | POA: Diagnosis not present

## 2020-04-12 LAB — COMPREHENSIVE METABOLIC PANEL
ALT: 19 IU/L (ref 0–32)
AST: 19 IU/L (ref 0–40)
Albumin/Globulin Ratio: 1.4 (ref 1.2–2.2)
Albumin: 4.8 g/dL — ABNORMAL HIGH (ref 3.7–4.7)
Alkaline Phosphatase: 125 IU/L — ABNORMAL HIGH (ref 44–121)
BUN/Creatinine Ratio: 16 (ref 12–28)
BUN: 20 mg/dL (ref 8–27)
Bilirubin Total: 0.3 mg/dL (ref 0.0–1.2)
CO2: 20 mmol/L (ref 20–29)
Calcium: 9.9 mg/dL (ref 8.7–10.3)
Chloride: 96 mmol/L (ref 96–106)
Creatinine, Ser: 1.28 mg/dL — ABNORMAL HIGH (ref 0.57–1.00)
GFR calc Af Amer: 48 mL/min/{1.73_m2} — ABNORMAL LOW (ref >59)
GFR calc non Af Amer: 41 mL/min/{1.73_m2} — ABNORMAL LOW (ref >59)
Globulin, Total: 3.5 g/dL (ref 1.5–4.5)
Glucose: 211 mg/dL — ABNORMAL HIGH (ref 65–99)
Potassium: 4.5 mmol/L (ref 3.5–5.2)
Sodium: 136 mmol/L (ref 134–144)
Total Protein: 8.3 g/dL (ref 6.0–8.5)

## 2020-04-12 LAB — LIPID PANEL
Chol/HDL Ratio: 3.3 ratio (ref 0.0–4.4)
Cholesterol, Total: 169 mg/dL (ref 100–199)
HDL: 52 mg/dL (ref >39)
LDL Chol Calc (NIH): 100 mg/dL — ABNORMAL HIGH (ref 0–99)
Triglycerides: 92 mg/dL (ref 0–149)
VLDL Cholesterol Cal: 17 mg/dL (ref 5–40)

## 2020-04-12 LAB — T4, FREE: Free T4: 1.49 ng/dL (ref 0.82–1.77)

## 2020-04-12 LAB — TSH: TSH: 0.885 u[IU]/mL (ref 0.450–4.500)

## 2020-04-16 ENCOUNTER — Other Ambulatory Visit: Payer: Self-pay | Admitting: Nurse Practitioner

## 2020-04-16 ENCOUNTER — Other Ambulatory Visit: Payer: Self-pay | Admitting: Family Medicine

## 2020-04-16 DIAGNOSIS — E1165 Type 2 diabetes mellitus with hyperglycemia: Secondary | ICD-10-CM

## 2020-04-19 ENCOUNTER — Ambulatory Visit: Payer: Medicare Other | Admitting: Nurse Practitioner

## 2020-04-19 ENCOUNTER — Encounter: Payer: Self-pay | Admitting: Nurse Practitioner

## 2020-04-19 ENCOUNTER — Other Ambulatory Visit: Payer: Self-pay

## 2020-04-19 VITALS — BP 148/83 | HR 106 | Ht 67.0 in | Wt 225.0 lb

## 2020-04-19 DIAGNOSIS — E890 Postprocedural hypothyroidism: Secondary | ICD-10-CM

## 2020-04-19 DIAGNOSIS — E782 Mixed hyperlipidemia: Secondary | ICD-10-CM | POA: Diagnosis not present

## 2020-04-19 DIAGNOSIS — E1165 Type 2 diabetes mellitus with hyperglycemia: Secondary | ICD-10-CM

## 2020-04-19 DIAGNOSIS — I10 Essential (primary) hypertension: Secondary | ICD-10-CM

## 2020-04-19 LAB — POCT UA - MICROALBUMIN: Microalbumin Ur, POC: 10 mg/L

## 2020-04-19 LAB — POCT GLYCOSYLATED HEMOGLOBIN (HGB A1C): HbA1c, POC (controlled diabetic range): 7.9 % — AB (ref 0.0–7.0)

## 2020-04-19 MED ORDER — METFORMIN HCL 500 MG PO TABS: 500.0000 mg | ORAL_TABLET | Freq: Every day | ORAL | 3 refills | Status: DC

## 2020-04-19 MED ORDER — INSULIN LISPRO PROT & LISPRO (75-25 MIX) 100 UNIT/ML KWIKPEN: 55.0000 [IU] | PEN_INJECTOR | Freq: Two times a day (BID) | SUBCUTANEOUS | 3 refills | Status: DC

## 2020-04-19 NOTE — Patient Instructions (Signed)

## 2020-04-19 NOTE — Progress Notes (Signed)
04/19/2020  Endocrinology follow up note  Subjective:    Patient ID: Stacey Hanson, female    DOB: 1945/07/17, PCP Fayrene Helper, MD   Past Medical History:  Diagnosis Date  . BMI 30.0-30.9,adult 2012 215 LBS   2004 198 LBS  . Diabetes mellitus   . 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 GERD (gastroesophageal reflux disease)   . H. pylori infection 09/24/2010  . Hyperlipidemia   . Hypertension   . Hypothyroidism   . 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 Knee pain   . Obesity    Past Surgical History:  Procedure Laterality Date  . ABDOMINAL HYSTERECTOMY  1970  . CATARACT EXTRACTION W/PHACO Left 03/09/2019   Procedure: CATARACT EXTRACTION PHACO AND INTRAOCULAR LENS PLACEMENT (IOC);  Surgeon: Baruch Goldmann, MD;  Location: [facility];  Service: Ophthalmology;  Laterality: Left;  CDE: 9.08  . CATARACT EXTRACTION W/PHACO Right 03/27/2019   Procedure: CATARACT EXTRACTION PHACO AND INTRAOCULAR LENS PLACEMENT RIGHT EYE;  Surgeon: Baruch Goldmann, MD;  Location: [facility];  Service: Ophthalmology;  Laterality: Right;  CDE: 7.47  . CHOLECYSTECTOMY  OCT 2010 BZ BILIARY DYSKINESIA   CHRONIC CHOLECYSTITIS  . COLONOSCOPY  MAY 2009 SCREENING   pTC TICS, SML IH  . COLONOSCOPY N/A 08/11/2017   Procedure: COLONOSCOPY;  Surgeon: Danie Binder, MD;  Location: [facility];  Service: Endoscopy;  Laterality: N/A;  9:30am  . ESOPHAGOGASTRODUODENOSCOPY  10/03/2010   Procedure: ESOPHAGOGASTRODUODENOSCOPY (EGD);  Surgeon: Dorothyann Peng, MD;  Location: [facility];  Service: Endoscopy;  Laterality: N/A;  . ESOPHAGOGASTRODUODENOSCOPY  12/19/2002   RMR: Normal esophagus/Normal stomach/ Duodenum large bulbar diverticulum, 1 cm bulbar ulcer with surrounding/ inflammation as described above. Normal D2  . POLYPECTOMY  08/11/2017   Procedure: POLYPECTOMY;  Surgeon: Danie Binder, MD;  Location: [facility];  Service: Endoscopy;;  cecal   . SAVORY DILATION   10/03/2010   Procedure: SAVORY DILATION;  Surgeon: Dorothyann Peng, MD;  Location: [facility];  Service: Endoscopy;;  . TOTAL ABDOMINAL HYSTERECTOMY  1970  . TOTAL KNEE ARTHROPLASTY Left 12/21/2017   Procedure: LEFT TOTAL KNEE ARTHROPLASTY;  Surgeon: Paralee Cancel, MD;  Location: [facility];  Service: Orthopedics;  Laterality: Left;  70 mins block  . UPPER GASTROINTESTINAL ENDOSCOPY  OCT 2004 RMR   DUO TIC, & ULCER   Social History   Socioeconomic History  . Marital status: Single    Spouse name: Not on file  . Number of children: 3  . Years of education: 10  . Highest education level: 10th grade  Occupational History  . Occupation: [employer]OR  . Occupation: Retired   Tobacco Use  . Smoking status: Former Smoker    Packs/day: 0.25    Years: 10.00    Pack years: 2.50    Types: Cigarettes    Quit date: 02/20/1985    Years since quitting: 35.1  . Smokeless tobacco: Never Used  Vaping Use  . Vaping Use: Never used  Substance and Sexual Activity  . Alcohol use: No  . Drug use: No  . Sexual activity: Not Currently  Other Topics Concern  . Not on file  Social History Narrative   Lives alone    Social Determinants of Health   Financial Resource Strain: Low Risk   . Difficulty of Paying Living Expenses: Not hard at all  Food Insecurity: No Food Insecurity  . Worried About Estate manager/land agent  of Food in the Last Year: Never true  . Ran Out of Food in the Last Year: Never true  Transportation Needs: No Transportation Needs  . Lack of Transportation (Medical): No  . Lack of Transportation (Non-Medical): No  Physical Activity: Insufficiently Active  . Days of Exercise per Week: 2 days  . Minutes of Exercise per Session: 10 min  Stress: No Stress Concern Present  . Feeling of Stress : Not at all  Social Connections: Socially Isolated  . Frequency of Communication with Friends and Family: More than three times a week  . Frequency of Social Gatherings with Friends and Family: More than three  times a week  . Attends Religious Services: Never  . Active Member of Clubs or Organizations: No  . Attends Archivist Meetings: Never  . Marital Status: Never married   Outpatient Encounter Medications as of 04/19/2020  Medication Sig  . aspirin EC 81 MG tablet Take 81 mg by mouth daily.  . B-D UF III MINI PEN NEEDLES 31G X 5 MM MISC USE FOR TWICE DAILY INJECTIONS OF HUMALOG  . B-D ULTRAFINE III SHORT PEN 31G X 8 MM MISC USE TO INJECT INSULIN TWICE DAILY  . benazepril (LOTENSIN) 40 MG tablet TAKE 1 TABLET(40 MG) BY MOUTH DAILY  . Blood Glucose Monitoring Suppl (ONE TOUCH ULTRA 2) w/Device KIT Twice daily testing dx e11.65  . Calcium Carb-Cholecalciferol (CALCIUM 600/VITAMIN D3 PO) Take 2 tablets by mouth daily.  Marland Kitchen glipiZIDE (GLUCOTROL XL) 5 MG 24 hr tablet TAKE 1 TABLET(5 MG) BY MOUTH DAILY WITH BREAKFAST  . glucose blood (ONETOUCH ULTRA) test strip USE 1 STRIP TO CHECK GLUCOSE TWICE DAILY  . hydrochlorothiazide (HYDRODIURIL) 25 MG tablet TAKE 1 TABLET(25 MG) BY MOUTH DAILY  . Lancets (ONETOUCH DELICA PLUS [id number]) MISC USE 1 LANCET  TO CHECK GLUCOSE TWICE DAILY AS DIRECTED  . levothyroxine (SYNTHROID) 88 MCG tablet TAKE 1 TABLET(88 MCG) BY MOUTH DAILY BEFORE BREAKFAST  . Multiple Vitamin (MULTIVITAMIN) capsule Take 1 capsule by mouth daily.  Marland Kitchen omeprazole (PRILOSEC) 20 MG capsule TAKE 1 CAPSULE BY MOUTH EVERY MORNING  . pravastatin (PRAVACHOL) 40 MG tablet TAKE 1 TABLET(40 MG) BY MOUTH DAILY  . UNABLE TO FIND Diabetic shoes x 1 pair, Inserts x 3 pair DX E11.9  . [DISCONTINUED] Insulin Lispro Prot & Lispro (HUMALOG MIX 75/25 KWIKPEN) (75-25) 100 UNIT/ML Kwikpen ADMINISTER 40 UNITS UNDER THE SKIN TWICE DAILY (Patient taking differently: Inject 50 Units into the skin 2 (two) times daily before a meal. ADMINISTER 40 UNITS UNDER THE SKIN TWICE DAILY)  . [DISCONTINUED] metFORMIN (GLUCOPHAGE) 500 MG tablet TAKE 1 TABLET(500 MG) BY MOUTH TWICE DAILY WITH A MEAL  . Insulin Lispro Prot &  Lispro (HUMALOG MIX 75/25 KWIKPEN) (75-25) 100 UNIT/ML Kwikpen Inject 55 Units into the skin 2 (two) times daily with a meal.  . metFORMIN (GLUCOPHAGE) 500 MG tablet Take 1 tablet (500 mg total) by mouth daily with breakfast.   No facility-administered encounter medications on file as of 04/19/2020.   ALLERGIES: Allergies  Allergen Reactions  . Simvastatin Other (See Comments)    Muscle cramps   VACCINATION STATUS: Immunization History  Administered Date(s) Administered  . Fluad Quad(high Dose 65+) 11/23/2018, 12/21/2019  . Influenza,inj,Quad PF,6+ Mos 11/16/2012, 11/15/2013, 11/14/2014, 11/14/2015, 11/03/2016, 11/09/2017  . Influenza,inj,quad, With Preservative 11/02/2016  . Moderna Sars-Covid-2 Vaccination 04/27/2019, 05/26/2019, 01/08/2020  . Pneumococcal Conjugate-13 10/11/2013  . Pneumococcal Polysaccharide-23 10/07/2011  . Zoster 04/05/2006    Hypertension This is a chronic problem.  The current episode started more than 1 year ago. The problem has been waxing and waning since onset. The problem is uncontrolled. Associated symptoms include sweats. Pertinent negatives include no blurred vision, headaches, palpitations or shortness of breath. Agents associated with hypertension include thyroid hormones. Risk factors for coronary artery disease include diabetes mellitus, dyslipidemia, obesity, sedentary lifestyle and smoking/tobacco exposure. Past treatments include ACE inhibitors and diuretics. The current treatment provides mild improvement. There are no compliance problems.  Hypertensive end-organ damage includes kidney disease. Identifiable causes of hypertension include a thyroid problem.  Diabetes She presents for her follow-up diabetic visit. She has type 2 diabetes mellitus. Onset time: She was diagnosed at approximate age of 53 years. Her disease course has been worsening. Hypoglycemia symptoms include sweats. Pertinent negatives for hypoglycemia include no confusion, headaches,  nervousness/anxiousness or tremors. Pertinent negatives for diabetes include no blurred vision, no fatigue, no foot paresthesias, no polydipsia, no polyphagia and no polyuria. There are no hypoglycemic complications. Symptoms are improving. Diabetic complications include nephropathy. Risk factors for coronary artery disease include dyslipidemia, diabetes mellitus, obesity, hypertension, sedentary lifestyle, post-menopausal and tobacco exposure. Current diabetic treatment includes insulin injections and oral agent (dual therapy). She is compliant with treatment most of the time. Her weight is fluctuating minimally. She is following a generally unhealthy diet. When asked about meal planning, she reported none. She has not had a previous visit with a dietitian. She rarely participates in exercise. Her home blood glucose trend is fluctuating minimally. Her breakfast blood glucose range is generally 180-200 mg/dl. Her dinner blood glucose range is generally 140-180 mg/dl. (She presents today with her meter and logs showing persistently high glycemic profile both fasting and postprandially.  Her POCT A1c today is 7.9%, increasing from previous visit of 7.3%.  She self adjusted her insulin dose because she was running out of insulin before the pharmacy would allow her a refill.  Analysis of her meter shows  7-day average of 171, 14-day average of 172, 30-day average of 170.  There are no documented or reported episodes of hypoglycemia.) An ACE inhibitor/angiotensin II receptor blocker is being taken. She does not see a podiatrist.Eye exam is current.  Thyroid Problem Presents for follow-up (-She is also status post radioactive iodine treatment for toxic nodule on October 17, 2010.  She has had  history of multinodular goiter status post fine-needle aspiration with benign findings.) visit. Patient reports no anxiety, cold intolerance, constipation, depressed mood, diarrhea, fatigue, heat intolerance, leg swelling,  palpitations or tremors. The symptoms have been stable.   Review of systems  Constitutional: + Minimally fluctuating body weight,  current Body mass index is 35.24 kg/m. , no fatigue, no subjective hyperthermia, no subjective hypothermia Eyes: no blurry vision, no xerophthalmia ENT: no sore throat, no nodules palpated in throat, no dysphagia/odynophagia, no hoarseness Cardiovascular: no chest pain, no shortness of breath, no palpitations, no leg swelling Respiratory: no cough, no shortness of breath Gastrointestinal: no nausea/vomiting/diarrhea Musculoskeletal: She reports pain in her left back, hip, and knee Skin: no rashes, no hyperemia Neurological: no tremors, no numbness, no tingling, no dizziness Psychiatric: no depression, no anxiety  Objective:    BP (!) 148/83 (BP Location: Right Arm)   Pulse (!) 106   Ht '5\' 7"'  (1.702 m)   Wt 225 lb (102.1 kg)   BMI 35.24 kg/m   Wt Readings from Last 3 Encounters:  04/19/20 225 lb (102.1 kg)  03/06/20 229 lb 1.9 oz (103.9 kg)  02/15/20 221 lb (100.2 kg)  BP Readings from Last 3 Encounters:  04/19/20 (!) 148/83  03/06/20 138/82  02/15/20 130/72     Physical Exam- Limited  Constitutional:  Body mass index is 35.24 kg/m. , not in acute distress, normal state of mind Eyes:  EOMI, no exophthalmos Neck: Supple Cardiovascular: RRR, no murmers, rubs, or gallops, no edema Respiratory: Adequate breathing efforts, no crackles, rales, rhonchi, or wheezing Musculoskeletal: no gross deformities, strength intact in all four extremities, no gross restriction of joint movements Skin:  no rashes, no hyperemia Neurological: no tremor with outstretched hands   POCT ABI Results 04/19/20   Right ABI:  1.22      Left ABI:  1.19  Right leg systolic / diastolic: 003/49 mmHg Left leg systolic / diastolic: 179/15 mmHg  Arm systolic / diastolic: 056/97 mmHG  Detailed report will be scanned into patient chart.    CMP     Component Value  Date/Time   NA 136 04/11/2020 1007   K 4.5 04/11/2020 1007   CL 96 04/11/2020 1007   CO2 20 04/11/2020 1007   GLUCOSE 211 (H) 04/11/2020 1007   GLUCOSE 412 (H) 03/07/2019 0940   BUN 20 04/11/2020 1007   CREATININE 1.28 (H) 04/11/2020 1007   CREATININE 0.96 (H) 03/12/2016 0925   CALCIUM 9.9 04/11/2020 1007   PROT 8.3 04/11/2020 1007   ALBUMIN 4.8 (H) 04/11/2020 1007   AST 19 04/11/2020 1007   ALT 19 04/11/2020 1007   ALKPHOS 125 (H) 04/11/2020 1007   BILITOT 0.3 04/11/2020 1007   GFRNONAA 41 (L) 04/11/2020 1007   GFRNONAA 60 03/12/2016 0925   GFRAA 48 (L) 04/11/2020 1007   GFRAA 69 03/12/2016 0925     Diabetic Labs (most recent): Lab Results  Component Value Date   HGBA1C 7.9 (A) 04/19/2020   HGBA1C 7.3 (A) 12/18/2019   HGBA1C 8.4 (A) 09/15/2019     Lipid Panel ( most recent) Lipid Panel     Component Value Date/Time   CHOL 169 04/11/2020 1007   TRIG 92 04/11/2020 1007   HDL 52 04/11/2020 1007   CHOLHDL 3.3 04/11/2020 1007   CHOLHDL 2.2 01/11/2016 0916   VLDL 11 01/11/2016 0916   LDLCALC 100 (H) 04/11/2020 1007    Assessment & Plan:   1) Uncontrolled type 2 diabetes mellitus with stage 3 chronic kidney disease, with long-term current use of insulin (Ione)  -She will continue to need insulin treatment in order for her to achieve control of diabetes to target.    She presents today with her meter and logs showing persistently high glycemic profile both fasting and postprandially.  Her POCT A1c today is 7.9%, increasing from previous visit of 7.3%.  She self adjusted her insulin dose because she was running out of insulin before the pharmacy would allow her a refill.  Analysis of her meter shows  7-day average of 171, 14-day average of 172, 30-day average of 170.  There are no documented or reported episodes of hypoglycemia.  - Nutritional counseling repeated at each appointment due to patients tendency to fall back in to old habits.  - The patient admits there is a  room for improvement in their diet and drink choices. -  Suggestion is made for the patient to avoid simple carbohydrates from their diet including Cakes, Sweet Desserts / Pastries, Ice Cream, Soda (diet and regular), Sweet Tea, Candies, Chips, Cookies, Sweet Pastries,  Store Bought Juices, Alcohol in Excess of  1-2 drinks a day, Artificial Sweeteners, Coffee Creamer, and "Sugar-free"  Products. This will help patient to have stable blood glucose profile and potentially avoid unintended weight gain.   - I encouraged the patient to switch to  unprocessed or minimally processed complex starch and increased protein intake (animal or plant source), fruits, and vegetables.   - Patient is advised to stick to a routine mealtimes to eat 3 meals  a day and avoid unnecessary snacks ( to snack only to correct hypoglycemia).  -She is responding well to Humalog 75/25.  Based on her glycemic profile, she will tolerate slight increase in her dose to 55 units with breakfast and 55 units with supper if glucose readings are above 90 and she is eating.  -Based on worsening renal function, she is advised to lower her dose of Metformin 500 mg po once daily with breakfast and continue Glipizide 5 mg XL daily with breakfast.   -She is advised to continue monitoring blood glucose at least 3 times daily, before injecting insulin at breakfast and supper, and before bed.  She is to contact the clinic if she has readings less than 70 or greater than 200 for 3 tests in a row.  2) Hypothyroidism- s/p RAI -Her recent thyroid function tests are consistent with appropriate hormone replacement.  She is advised to continue Levothyroxine 88 mcg po daily before breakfast.    - We discussed about the correct intake of her thyroid hormone, on empty stomach at fasting, with water, separated by at least 30 minutes from breakfast and other medications,  and separated by more than 4 hours from calcium, iron, multivitamins, acid reflux  medications (PPIs). -Patient is made aware of the fact that thyroid hormone replacement is needed for life, dose to be adjusted by periodic monitoring of thyroid function tests.  3) Weight management:  Her Body mass index is 35.24 kg/m.-a candidate for modest weight loss.  Patient specific carbs restrictions and exercise regimen discussed with her.  4) Blood pressure/ Hypertension: -Her blood pressure is controlled to target for her age.  She is advised to continue Benazepril 40 mg po daily and HCTZ 25 mg po daily.    5) Hyperlipidemia: Her recent lipid panel from 04/11/20 showed uncontrolled LDL of 100.  She is advised to continue Pravastatin 40 mg po daily at bedtime.  Side effects and precautions discussed with her.  - I advised patient to maintain close follow up with Fayrene Helper, MD for diabetes and all other primary care needs.   - Time spent on this patient care encounter:  40 min, of which > 50% was spent in  counseling and the rest reviewing her blood glucose logs , discussing her hypoglycemia and hyperglycemia episodes, reviewing her current and  previous labs / studies  ( including abstraction from other facilities) and medications  doses and developing a  long term treatment plan and documenting her care.   Please refer to Patient Instructions for Blood Glucose Monitoring and Insulin/Medications Dosing Guide"  in media tab for additional information. Please  also refer to " Patient Self Inventory" in the Media  tab for reviewed elements of pertinent patient history.  Stacey Hanson participated in the discussions, expressed understanding, and voiced agreement with the above plans.  All questions were answered to her satisfaction. she is encouraged to contact clinic should she have any questions or concerns prior to her return visit.  Follow up plan: Return in about 4 months (around 08/17/2020) for Diabetes follow up with A1c in office, Thyroid follow up, Previsit labs, Bring  glucometer and logs.  Rayetta Pigg, [facility] [address] [place], [place] [address] Phone: [phone] Fax: [phone]  04/19/2020, 11:19 AM

## 2020-04-22 DIAGNOSIS — E119 Type 2 diabetes mellitus without complications: Secondary | ICD-10-CM | POA: Diagnosis not present

## 2020-05-13 ENCOUNTER — Other Ambulatory Visit: Payer: Self-pay | Admitting: Family Medicine

## 2020-05-14 IMAGING — DX DG CHEST 2V
2 series · 2 of 2 positions shown · non-contrast
Comparison: None.

CLINICAL DATA: Cough, shortness of breath on exertion.

EXAM:
CHEST - 2 VIEW

[chest pa]
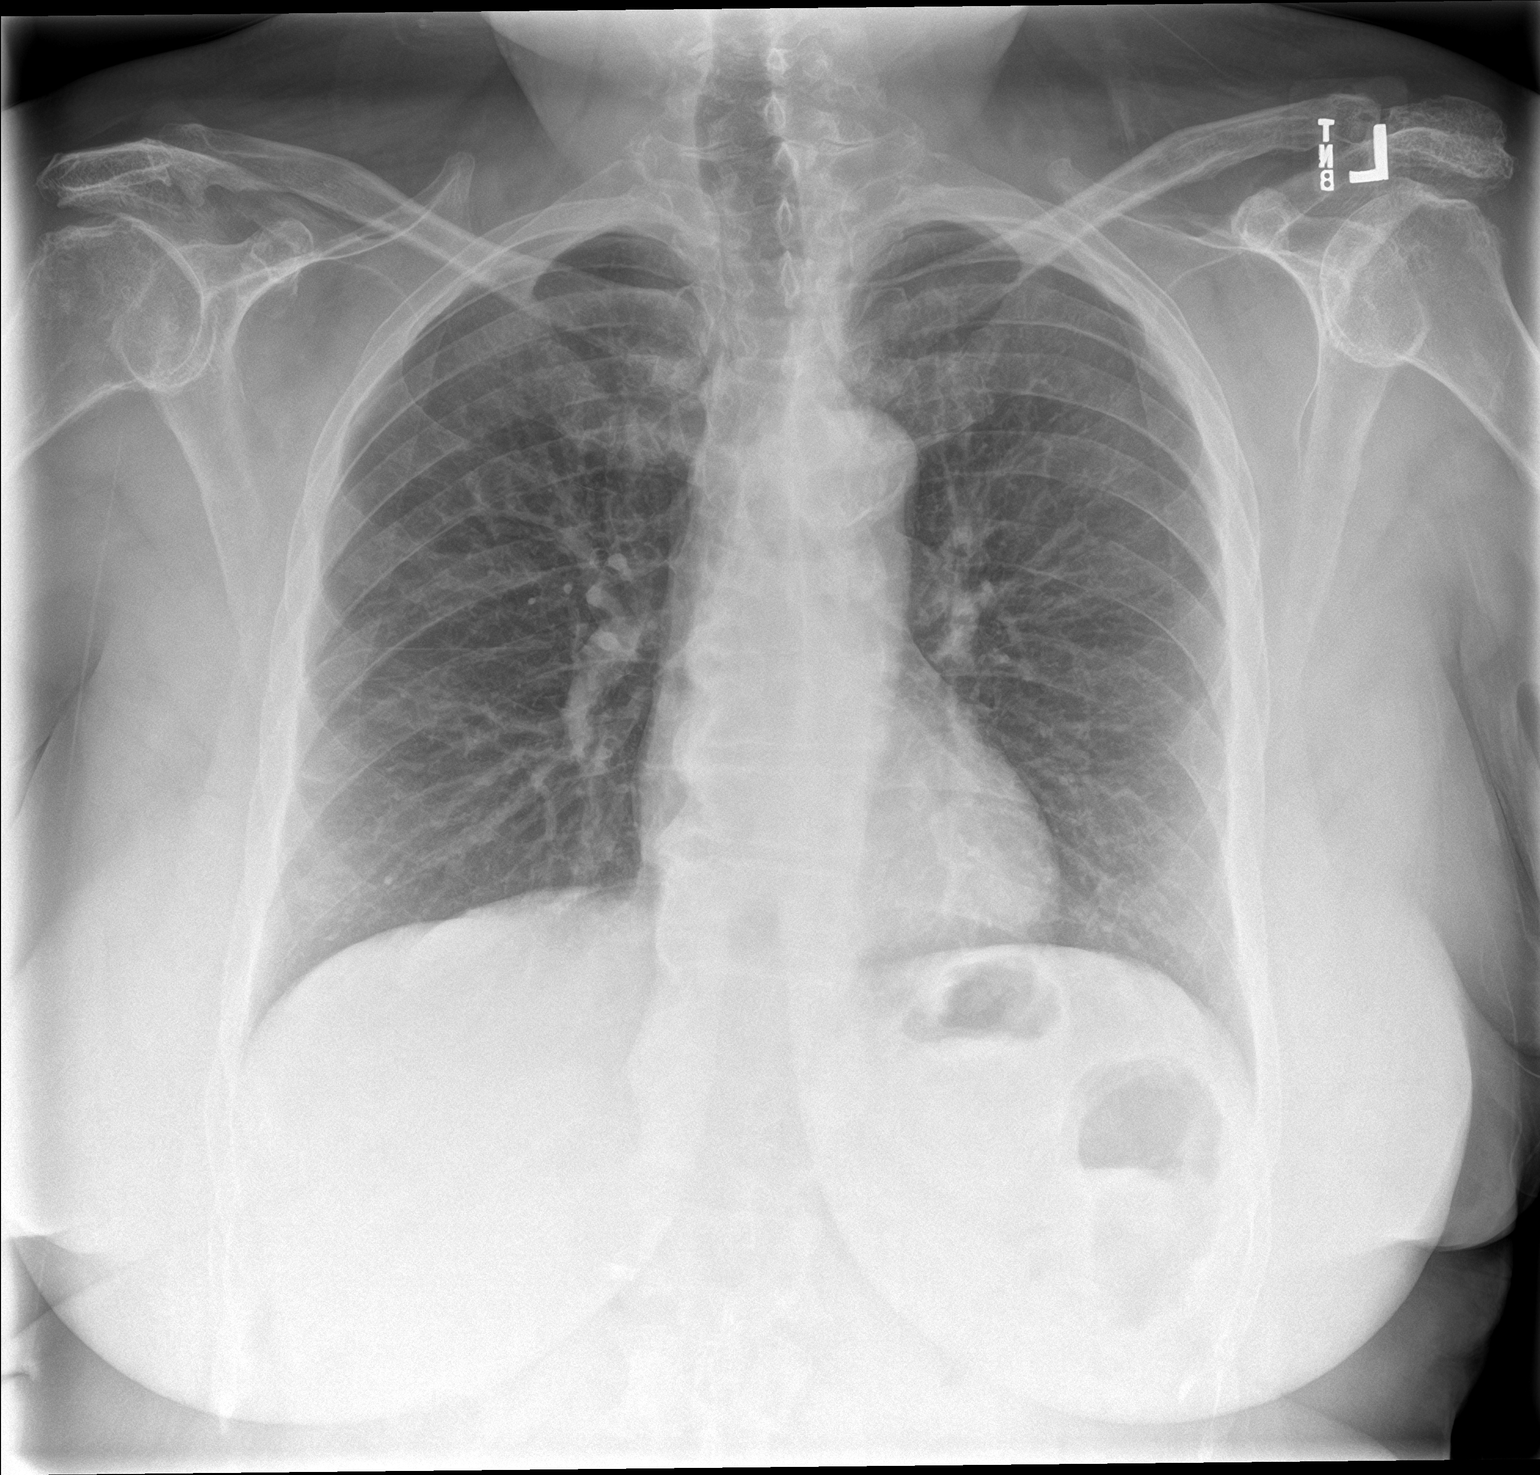

[chest lat]
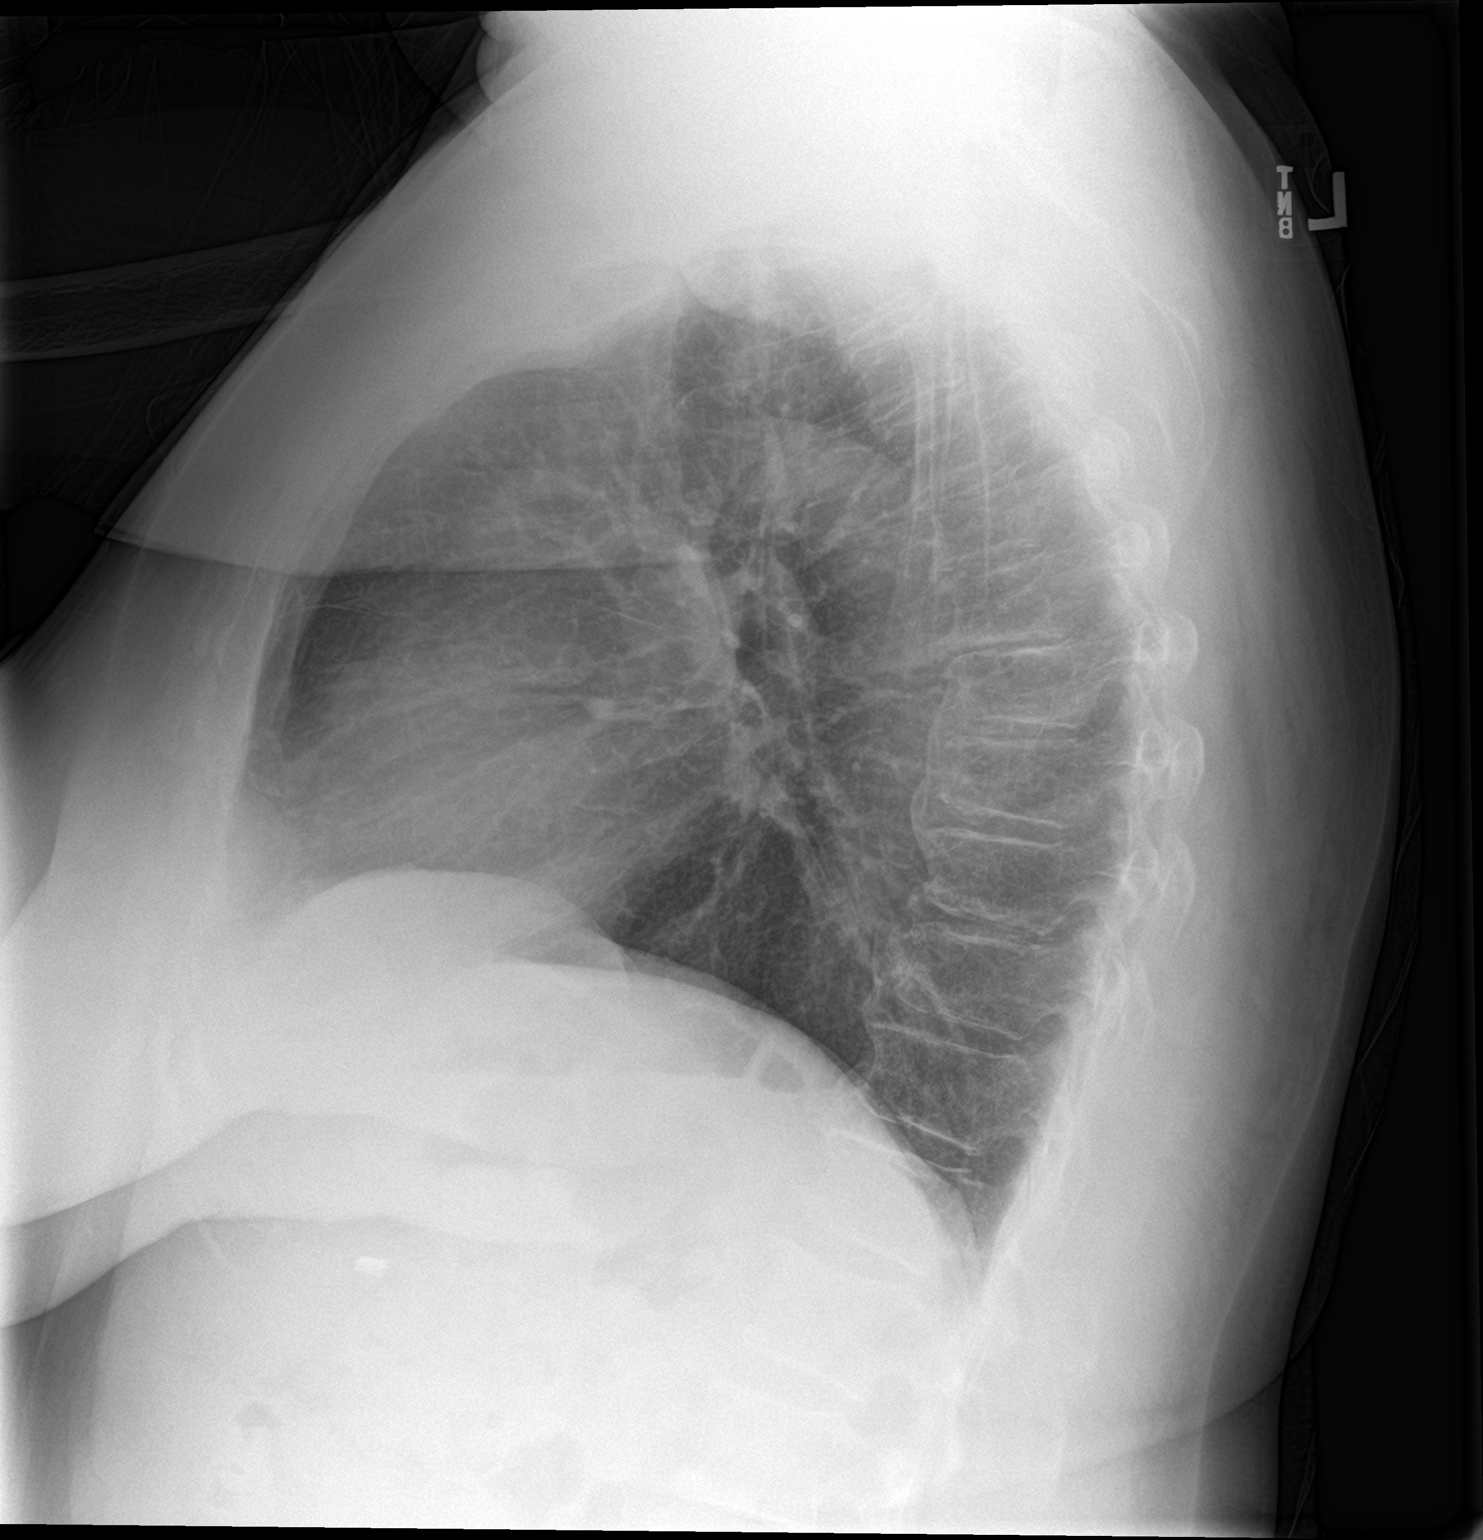

[2 of 2 positions shown; findings below may reference images not displayed]

FINDINGS: Trachea is midline. Heart size normal. Thoracic aorta is calcified.
Lungs are clear. No pleural fluid.
IMPRESSION: No acute findings.

## 2020-05-20 IMAGING — US US THYROID
1 series · 13 of 25 positions shown · non-contrast
Comparison: Most recent prior thyroid ultrasound 10/31/2015; 5 year
prior ultrasound 10/20/2012

CLINICAL DATA: Goiter. 72-year-old female with a history of thyroid
nodules.

EXAM:
THYROID ULTRASOUND
TECHNIQUE: Ultrasound examination of the thyroid gland and adjacent soft
tissues was performed.

[Series 1: us thyroid · 13 of 63 slices shown]
[im 1/63]
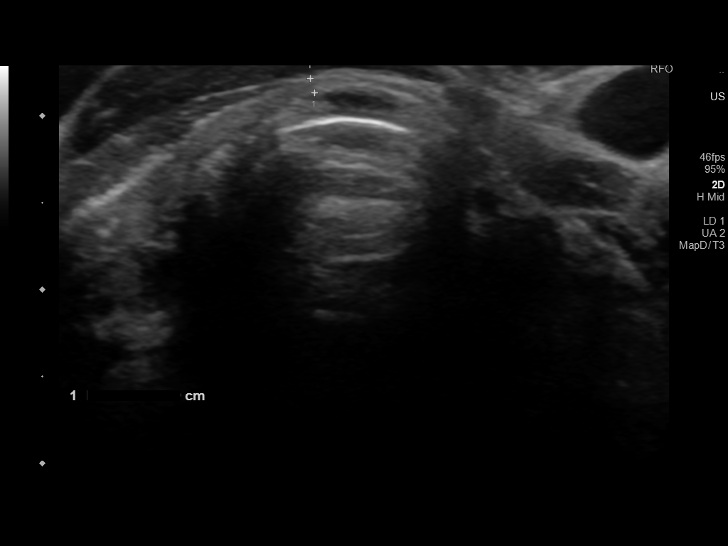
[im 6/63]
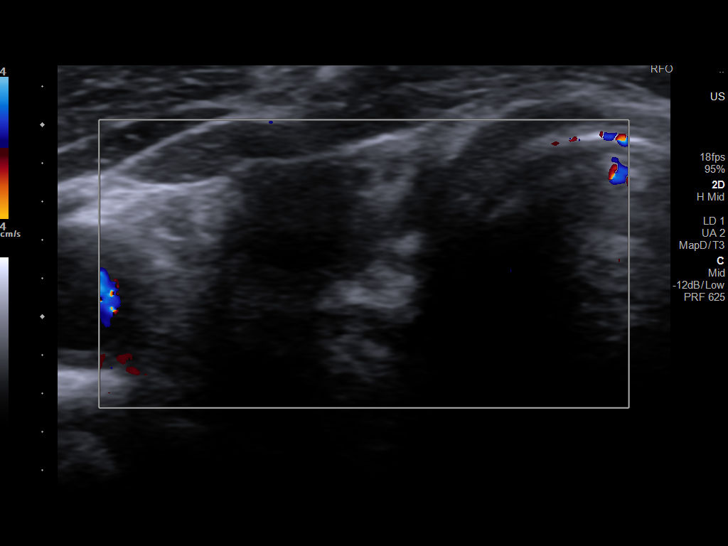
[im 11/63]
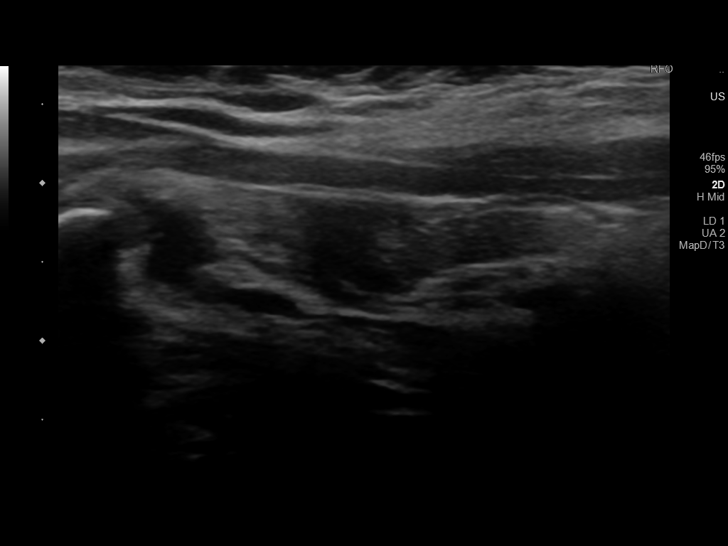
[im 16/63]
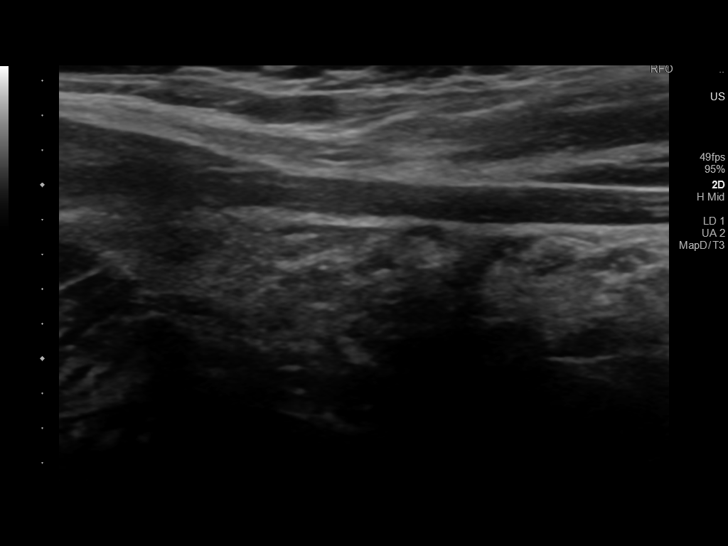
[im 21/63]
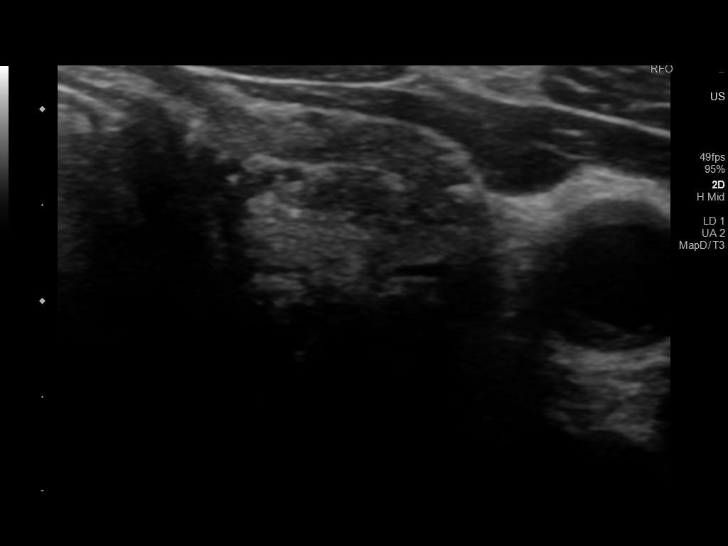
[im 26/63]
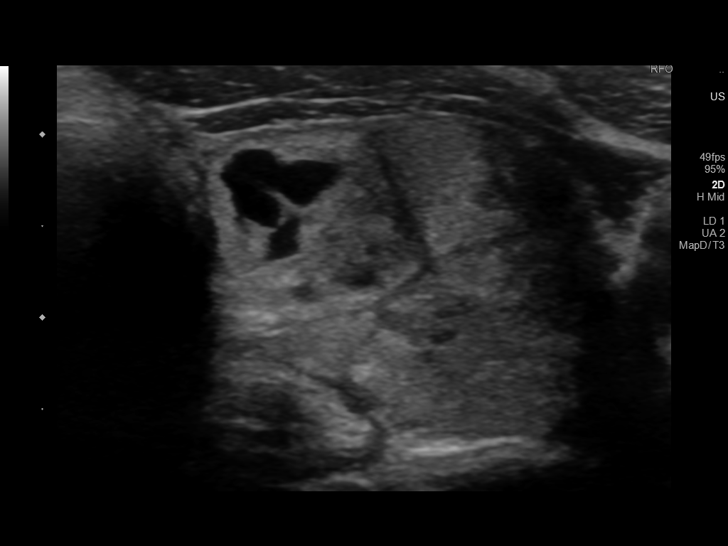
[im 32/63]
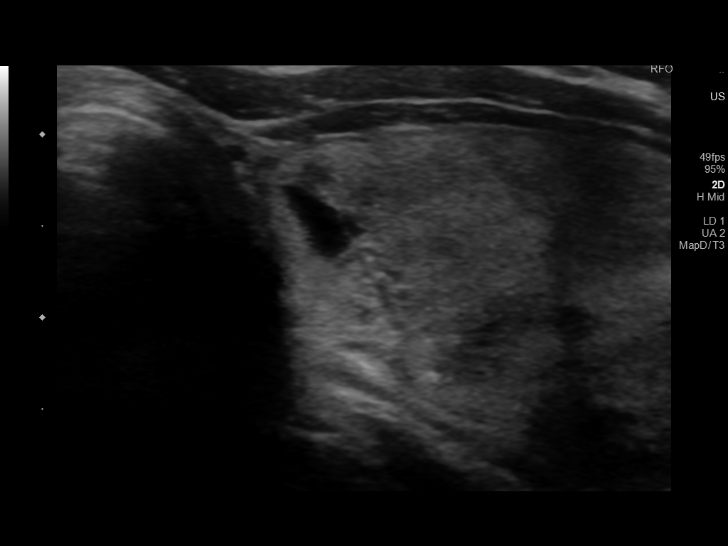
[im 37/63]
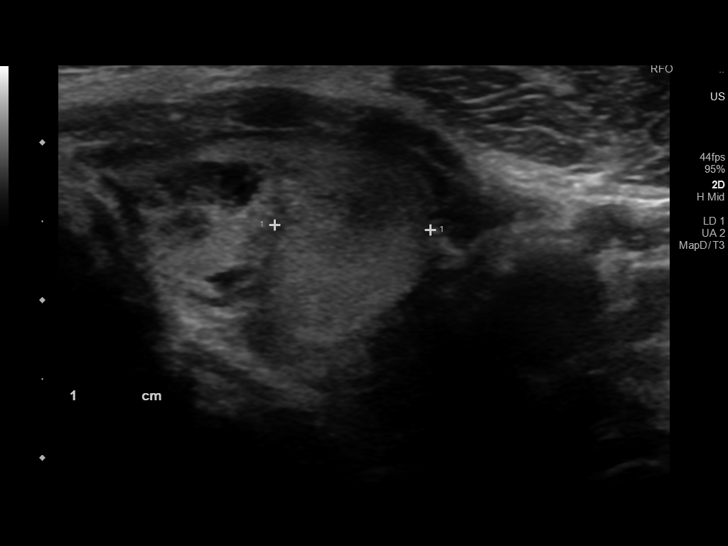
[im 42/63]
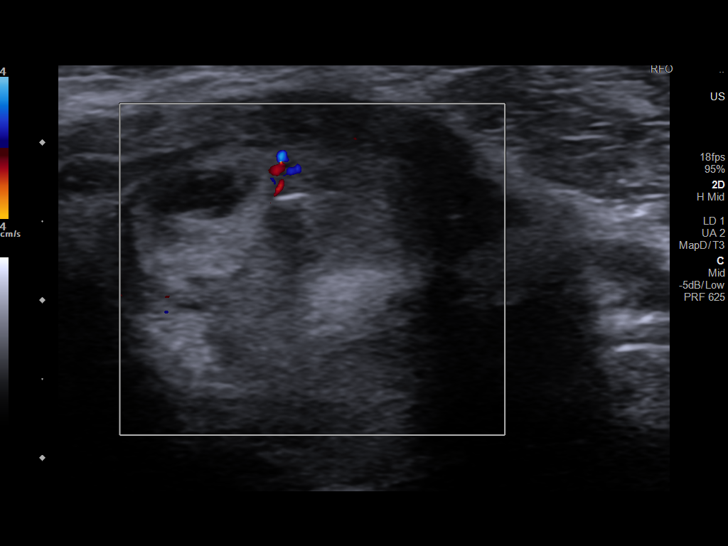
[im 47/63]
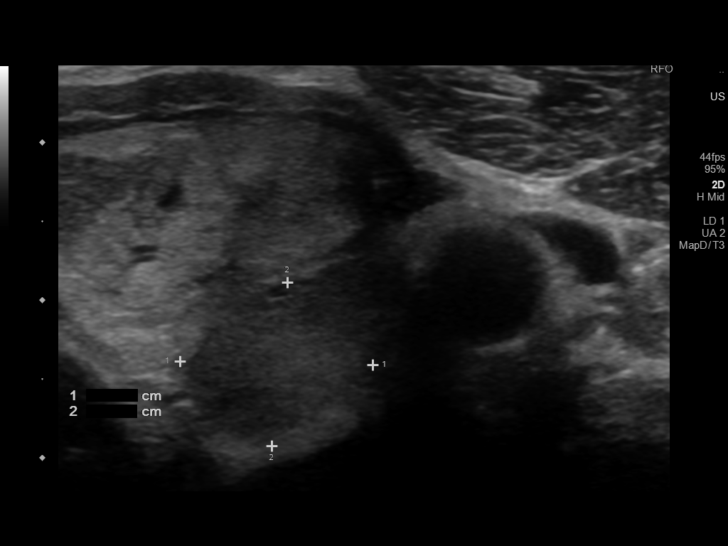
[im 52/63]
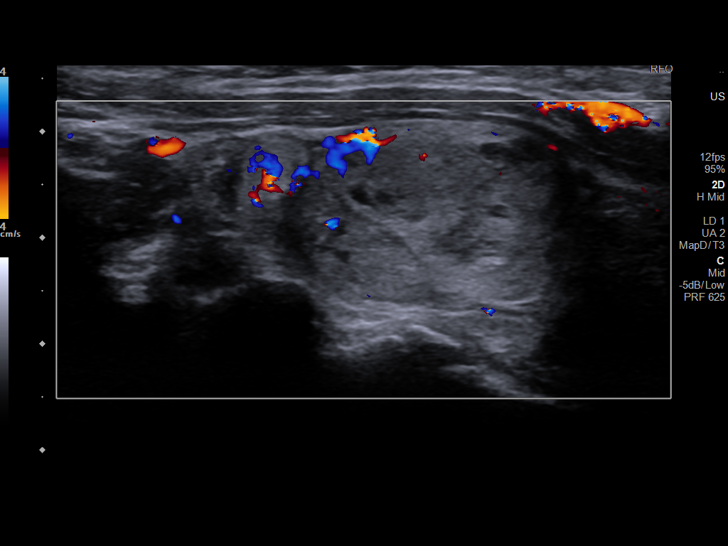
[im 57/63]
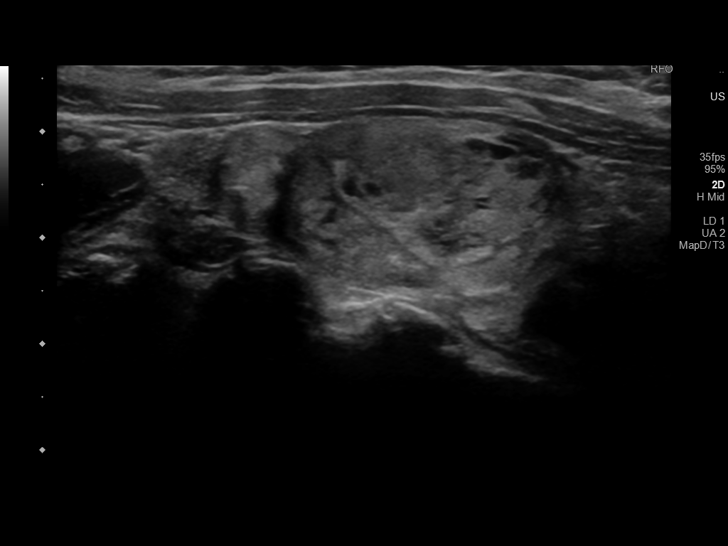
[im 63/63]
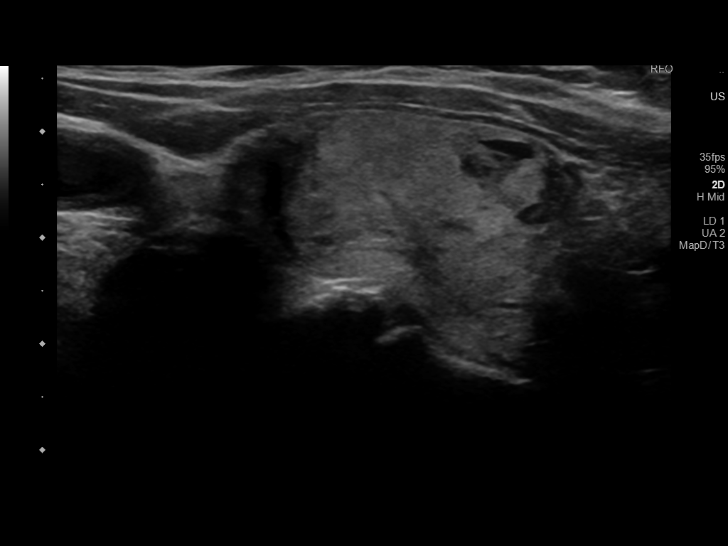

[13 of 25 positions shown; findings below may reference images not displayed]

FINDINGS: Parenchymal Echotexture: Mildly heterogenous

Isthmus: 0.1 cm

Right lobe: 2.6 x 0.6 x 1.1 cm

Left lobe: 5.0 x 2.1 x 2.1 cm

_________________________________________________________

Estimated total number of nodules >/= 1 cm: 3

Number of spongiform nodules >/=  2 cm not described below (TR1): 0

Number of mixed cystic and solid nodules >/= 1.5 cm not described
below (TR2): 0

_________________________________________________________

Nodule # 1: Small 0.6 cm hypoechoic solid nodule in the inferior
right gland has decreased in size compared to 0.9 cm previously. No
further follow-up required.

Nodule # 2: Peripherally calcified nodule in the left upper gland
demonstrates no significant interval change dating back to Sunday September, 2012. Five year stability is consistent with benignity. No further
follow-up required.

Nodule # 3: Isoechoic predominantly solid nodule in the medial
aspect of the left mid gland measures 1.5 x 1.2 x 2.3 cm which is
insignificantly changed compared to 2.3 x 1.8 x 2.0 cm in Sunday September, 2012. Five year stability is consistent with benignity. No further
follow-up required.

Nodule # 4: Predominantly solid isoechoic nodule in the lateral
aspect of the mid to lower left gland measures 1.0 x 1.3 x 2.4 cm.
This lesion has involuted compared to prior imaging from Sunday September, 2012. Involution over time is consistent with benignity.

Nodule # 5: Presently measured as a separate nodule, but previously
measured as part of nodule # 4 above in Sunday September, 2012. There has
been no significant interval change in the size or appearance of
this nodule over the past 5 years consistent with benignity. No
further follow-up required.
IMPRESSION: Heterogeneous multinodular thyroid gland. Although thyroid nodules
demonstrate stability over the past 5 years dating back to
10/20/2012 consistent with benignity.

No further follow-up required.

The above is in keeping with the ACR TI-RADS recommendations - [HOSPITAL] 6717;[DATE].

## 2020-05-23 ENCOUNTER — Other Ambulatory Visit: Payer: Self-pay | Admitting: Family Medicine

## 2020-06-11 ENCOUNTER — Other Ambulatory Visit: Payer: Self-pay | Admitting: Family Medicine

## 2020-06-19 ENCOUNTER — Ambulatory Visit: Payer: Medicare Other | Admitting: Family Medicine

## 2020-06-26 ENCOUNTER — Ambulatory Visit (INDEPENDENT_AMBULATORY_CARE_PROVIDER_SITE_OTHER): Payer: Medicare Other | Admitting: Family Medicine

## 2020-06-26 ENCOUNTER — Ambulatory Visit (HOSPITAL_COMMUNITY)
Admission: RE | Admit: 2020-06-26 | Discharge: 2020-06-26 | Disposition: A | Discharge status: Home / Self Care | Payer: Medicare Other | Source: Ambulatory Visit | Attending: Family Medicine | Admitting: Family Medicine

## 2020-06-26 ENCOUNTER — Other Ambulatory Visit: Payer: Self-pay

## 2020-06-26 ENCOUNTER — Encounter: Payer: Self-pay | Admitting: Family Medicine

## 2020-06-26 VITALS — BP 140/80 | HR 102 | Resp 15 | Ht 67.0 in | Wt 227.0 lb

## 2020-06-26 DIAGNOSIS — M25552 Pain in left hip: Secondary | ICD-10-CM | POA: Insufficient documentation

## 2020-06-26 DIAGNOSIS — E1165 Type 2 diabetes mellitus with hyperglycemia: Secondary | ICD-10-CM | POA: Diagnosis not present

## 2020-06-26 DIAGNOSIS — E1159 Type 2 diabetes mellitus with other circulatory complications: Secondary | ICD-10-CM | POA: Diagnosis not present

## 2020-06-26 DIAGNOSIS — E890 Postprocedural hypothyroidism: Secondary | ICD-10-CM | POA: Diagnosis not present

## 2020-06-26 DIAGNOSIS — G8929 Other chronic pain: Secondary | ICD-10-CM | POA: Diagnosis not present

## 2020-06-26 DIAGNOSIS — Z1231 Encounter for screening mammogram for malignant neoplasm of breast: Secondary | ICD-10-CM | POA: Diagnosis not present

## 2020-06-26 DIAGNOSIS — M5410 Radiculopathy, site unspecified: Secondary | ICD-10-CM | POA: Diagnosis not present

## 2020-06-26 DIAGNOSIS — E782 Mixed hyperlipidemia: Secondary | ICD-10-CM

## 2020-06-26 DIAGNOSIS — I10 Essential (primary) hypertension: Secondary | ICD-10-CM | POA: Diagnosis not present

## 2020-06-26 MED ORDER — AMLODIPINE BESYLATE 2.5 MG PO TABS: 2.5000 mg | ORAL_TABLET | Freq: Every day | ORAL | 3 refills | Status: DC

## 2020-06-26 NOTE — Patient Instructions (Signed)
F/u in 5 to 6 weeks re evaluate bP in office , call if you need me sooner  New for bP is amlodipine 2.5 mg daily, continue benazepril and hCTZ as before  X ray of left hip today, I believe the problem is in your back  Please schedule mammogram at checkout  Microalb today please  Thanks for choosing [facility], we consider it a privelige to serve you.  Careful not to fall  Thanks for choosing [facility], we consider it a privelige to serve you.

## 2020-06-26 NOTE — Assessment & Plan Note (Signed)
Rated at 9, x 1 year , worsening , radiaites to to ankle

## 2020-06-28 ENCOUNTER — Telehealth: Payer: Self-pay

## 2020-06-28 DIAGNOSIS — M5410 Radiculopathy, site unspecified: Secondary | ICD-10-CM

## 2020-06-28 LAB — MICROALBUMIN / CREATININE URINE RATIO
Creatinine, Urine: 45.3 mg/dL
Microalb/Creat Ratio: <7 mg/g creat (ref 0–29)
Microalbumin, Urine: <3 ug/mL

## 2020-06-28 NOTE — Telephone Encounter (Signed)
-----   Message from Fayrene Helper, MD sent at 06/27/2020  4:31 PM EDT ----- Pls advise X ray of hip is normal so the problem is coming from her back, needs updated mRI lumbar spine, last was in 2011, pls order without contrast if she agrees, has established arthritis and disc disease

## 2020-06-29 ENCOUNTER — Encounter: Payer: Self-pay | Admitting: Family Medicine

## 2020-06-29 NOTE — Assessment & Plan Note (Signed)
Stacey Hanson is reminded of the importance of commitment to daily physical activity for 30 minutes or more, as able and the need to limit carbohydrate intake to 30 to 60 grams per meal to help with blood sugar control.   The need to take medication as prescribed, test blood sugar as directed, and to call between visits if there is a concern that blood sugar is uncontrolled is also discussed.   Stacey Hanson is reminded of the importance of daily foot exam, annual eye examination, and good blood sugar, blood pressure and cholesterol control. Deteriorated, managed by endo  Diabetic Labs Latest Ref Rng & Units 06/26/2020 04/19/2020 04/11/2020 12/18/2019 09/15/2019  HbA1c 0.0 - 7.0 % - 7.9(A) - 7.3(A) 8.4(A)  Microalbumin mg/L - 10 - - -  Micro/Creat Ratio 0 - 29 mg/g creat <7 - - - -  Chol 100 - 199 mg/dL - - 169 - -  HDL >39 mg/dL - - 52 - -  Calc LDL 0 - 99 mg/dL - - 100(H) - -  Triglycerides 0 - 149 mg/dL - - 92 - -  Creatinine 0.57 - 1.00 mg/dL - - 1.28(H) - -   BP/Weight 06/26/2020 04/19/2020 03/06/2020 02/15/2020 02/14/2020 12/21/2019 79/15/0569  Systolic BP 794 801 655 374 827 078 675  Diastolic BP 80 83 82 72 75 72 82  Wt. (Lbs) 227 225 229.12 221 226.2 221.04 220.8  BMI 35.55 35.24 35.89 34.61 35.43 34.62 32.61   Foot/eye exam completion dates Latest Ref Rng & Units 03/06/2020 05/25/2019  Eye Exam No Retinopathy - -  Foot exam Order - - -  Foot Form Completion - Done Done

## 2020-06-29 NOTE — Assessment & Plan Note (Signed)
Hyperlipidemia:Low fat diet discussed and encouraged.   Lipid Panel  Lab Results  Component Value Date   CHOL 169 04/11/2020   HDL 52 04/11/2020   LDLCALC 100 (H) 04/11/2020   TRIG 92 04/11/2020   CHOLHDL 3.3 04/11/2020     Not at goal, needs to reduce fat I diet, no med change

## 2020-06-29 NOTE — Assessment & Plan Note (Signed)
unControlled, no change in medication, pt in pain DASH diet and commitment to daily physical activity for a minimum of 30 minutes discussed and encouraged, as a part of hypertension management. The importance of attaining a healthy weight is also discussed.  BP/Weight 06/26/2020 04/19/2020 03/06/2020 02/15/2020 02/14/2020 12/21/2019 41/66/0630  Systolic BP 160 109 323 557 322 025 427  Diastolic BP 80 83 82 72 75 72 82  Wt. (Lbs) 227 225 229.12 221 226.2 221.04 220.8  BMI 35.55 35.24 35.89 34.61 35.43 34.62 32.61

## 2020-06-29 NOTE — Progress Notes (Signed)
Stacey Hanson     MRN: [id number]      DOB: 1945-05-05   HPI Stacey Hanson is here for follow up and re-evaluation of chronic medical conditions, medication management and review of any available recent lab and radiology data.  Preventive health is updated, specifically  Cancer screening and Immunization.   Questions or concerns regarding consultations or procedures which the PT has had in the interim are  addressed. The PT denies any adverse reactions to current medications since the last visit.  C/o uncontrolled LLE pain Denies polyuria, polydipsia, blurred vision , or hypoglycemic episodes.   ROS Denies recent fever or chills. Denies sinus pressure, nasal congestion, ear pain or sore throat. Denies chest congestion, productive cough or wheezing. Denies chest pains, palpitations and leg swelling Denies abdominal pain, nausea, vomiting,diarrhea or constipation.   Denies dysuria, frequency, hesitancy or incontinence. C/o uncontrolled left hip pain to foot, no recent trauma, states epidural injections have not helped in the past Denies headaches, seizures, numbness, or tingling. Denies depression, anxiety or insomnia. Denies skin break down or rash.   PE  BP 140/80   Pulse (!) 102   Resp 15   Ht 5\' 7"  (1.702 m)   Wt 227 lb (103 kg)   SpO2 95%   BMI 35.55 kg/m   Patient alert and oriented and in no cardiopulmonary distress.  HEENT: No facial asymmetry, EOMI,     Neck supple .  Chest: Clear to auscultation bilaterally.  CVS: S1, S2 no murmurs, no S3.Regular rate.  ABD: Soft non tender.   Ext: No edema  MS: decreased  ROM spine, adequate in shoulders, hips and knees.  Skin: Intact, no ulcerations or rash noted.  Psych: Good eye contact, normal affect. Memory intact not anxious or depressed appearing.  CNS: CN 2-12 intact, power,  normal throughout.no focal deficits noted.   Assessment & Plan  Chronic hip pain, left Rated at 9, x 1 year , worsening , radiaites  to to ankle  Back pain with left-sided radiculopathy Likely cause of current symptom, needs updated mRI if X ray hip is normal  ESSENTIAL HYPERTENSION, BENIGN unControlled, no change in medication, pt in pain DASH diet and commitment to daily physical activity for a minimum of 30 minutes discussed and encouraged, as a part of hypertension management. The importance of attaining a healthy weight is also discussed.  BP/Weight 06/26/2020 04/19/2020 03/06/2020 02/15/2020 02/14/2020 12/21/2019 30/86/5784  Systolic BP 696 295 284 132 440 102 725  Diastolic BP 80 83 82 72 75 72 82  Wt. (Lbs) 227 225 229.12 221 226.2 221.04 220.8  BMI 35.55 35.24 35.89 34.61 35.43 34.62 32.61       Type 2 diabetes mellitus with vascular disease ([place]) Stacey Hanson is reminded of the importance of commitment to daily physical activity for 30 minutes or more, as able and the need to limit carbohydrate intake to 30 to 60 grams per meal to help with blood sugar control.   The need to take medication as prescribed, test blood sugar as directed, and to call between visits if there is a concern that blood sugar is uncontrolled is also discussed.   Stacey Hanson is reminded of the importance of daily foot exam, annual eye examination, and good blood sugar, blood pressure and cholesterol control. Deteriorated, managed by endo  Diabetic Labs Latest Ref Rng & Units 06/26/2020 04/19/2020 04/11/2020 12/18/2019 09/15/2019  HbA1c 0.0 - 7.0 % - 7.9(A) - 7.3(A) 8.4(A)  Microalbumin mg/L - 10 - - -  Micro/Creat Ratio 0 - 29 mg/g creat <7 - - - -  Chol 100 - 199 mg/dL - - 169 - -  HDL >39 mg/dL - - 52 - -  Calc LDL 0 - 99 mg/dL - - 100(H) - -  Triglycerides 0 - 149 mg/dL - - 92 - -  Creatinine 0.57 - 1.00 mg/dL - - 1.28(H) - -   BP/Weight 06/26/2020 04/19/2020 03/06/2020 02/15/2020 02/14/2020 12/21/2019 08/23/7626  Systolic BP 315 176 160 737 106 269 485  Diastolic BP 80 83 82 72 75 72 82  Wt. (Lbs) 227 225 229.12 221 226.2 221.04  220.8  BMI 35.55 35.24 35.89 34.61 35.43 34.62 32.61   Foot/eye exam completion dates Latest Ref Rng & Units 03/06/2020 05/25/2019  Eye Exam No Retinopathy - -  Foot exam Order - - -  Foot Form Completion - Done Done        Mixed hyperlipidemia Hyperlipidemia:Low fat diet discussed and encouraged.   Lipid Panel  Lab Results  Component Value Date   CHOL 169 04/11/2020   HDL 52 04/11/2020   LDLCALC 100 (H) 04/11/2020   TRIG 92 04/11/2020   CHOLHDL 3.3 04/11/2020     Not at goal, needs to reduce fat I diet, no med change  Hypothyroidism, postradioiodine therapy Stable, managed by Endo

## 2020-06-29 NOTE — Assessment & Plan Note (Signed)
Likely cause of current symptom, needs updated mRI if X ray hip is normal

## 2020-06-29 NOTE — Assessment & Plan Note (Signed)
Stable, managed by Endo °

## 2020-07-11 ENCOUNTER — Other Ambulatory Visit: Payer: Self-pay | Admitting: Family Medicine

## 2020-07-15 ENCOUNTER — Telehealth: Payer: Self-pay

## 2020-07-15 ENCOUNTER — Other Ambulatory Visit: Payer: Self-pay | Admitting: Nurse Practitioner

## 2020-07-15 ENCOUNTER — Other Ambulatory Visit: Payer: Self-pay | Admitting: Family Medicine

## 2020-07-15 DIAGNOSIS — E1165 Type 2 diabetes mellitus with hyperglycemia: Secondary | ICD-10-CM

## 2020-07-15 MED ORDER — METFORMIN HCL 500 MG PO TABS: ORAL_TABLET | ORAL | 0 refills | Status: DC

## 2020-07-15 NOTE — Telephone Encounter (Signed)
Directions changed on MTF

## 2020-07-15 NOTE — Telephone Encounter (Signed)
Patient needs her metformin RX directions changed to taking it once daily 500 mg. According to Whitney's last note, that is correct. Please advise  Based on worsening renal function, she is advised to lower her dose of Metformin 500 mg po once daily with breakfast and continue Glipizide 5 mg XL daily with breakfast.

## 2020-07-19 ENCOUNTER — Ambulatory Visit (HOSPITAL_COMMUNITY)
Admission: RE | Admit: 2020-07-19 | Discharge: 2020-07-19 | Disposition: A | Discharge status: Home / Self Care | Payer: Medicare Other | Source: Ambulatory Visit | Attending: Family Medicine | Admitting: Family Medicine

## 2020-07-19 ENCOUNTER — Other Ambulatory Visit: Payer: Self-pay

## 2020-07-19 DIAGNOSIS — M5410 Radiculopathy, site unspecified: Secondary | ICD-10-CM | POA: Insufficient documentation

## 2020-07-19 DIAGNOSIS — M545 Low back pain, unspecified: Secondary | ICD-10-CM | POA: Diagnosis not present

## 2020-07-22 ENCOUNTER — Other Ambulatory Visit: Payer: Self-pay | Admitting: *Deleted

## 2020-07-22 DIAGNOSIS — M5410 Radiculopathy, site unspecified: Secondary | ICD-10-CM

## 2020-08-08 ENCOUNTER — Encounter: Payer: Self-pay | Admitting: Family Medicine

## 2020-08-08 ENCOUNTER — Other Ambulatory Visit: Payer: Self-pay

## 2020-08-08 ENCOUNTER — Ambulatory Visit (INDEPENDENT_AMBULATORY_CARE_PROVIDER_SITE_OTHER): Payer: Medicare Other | Admitting: Family Medicine

## 2020-08-08 VITALS — BP 142/80 | HR 101 | Temp 99.6°F | Resp 20 | Ht 67.0 in | Wt 225.4 lb

## 2020-08-08 DIAGNOSIS — I10 Essential (primary) hypertension: Secondary | ICD-10-CM

## 2020-08-08 DIAGNOSIS — E890 Postprocedural hypothyroidism: Secondary | ICD-10-CM

## 2020-08-08 DIAGNOSIS — M5442 Lumbago with sciatica, left side: Secondary | ICD-10-CM

## 2020-08-08 DIAGNOSIS — E782 Mixed hyperlipidemia: Secondary | ICD-10-CM

## 2020-08-08 DIAGNOSIS — E1159 Type 2 diabetes mellitus with other circulatory complications: Secondary | ICD-10-CM | POA: Diagnosis not present

## 2020-08-08 DIAGNOSIS — G8929 Other chronic pain: Secondary | ICD-10-CM | POA: Diagnosis not present

## 2020-08-08 DIAGNOSIS — M5441 Lumbago with sciatica, right side: Secondary | ICD-10-CM | POA: Diagnosis not present

## 2020-08-08 DIAGNOSIS — E559 Vitamin D deficiency, unspecified: Secondary | ICD-10-CM

## 2020-08-08 NOTE — Patient Instructions (Addendum)
F/U in 4 months, call if you need me sooner  No med changes  Please get shingrix vaccines at your pharmacy    Please get fasting  lipid panel  and vio D 4 days before follow up   Nurse please get eye exam from Dr Jorja Loa done in Feb , 2022 and document result   Please get appt date and time with Dr Vertell Limber for pt at checkout, states needs to be Tuesday or Thursday. Pt asking for help with getting appointment. Thanks  Please also provide address and tele Number for his office   Thanks for choosing [facility], we consider it a privelige to serve you.

## 2020-08-09 ENCOUNTER — Encounter: Payer: Self-pay | Admitting: Family Medicine

## 2020-08-09 ENCOUNTER — Other Ambulatory Visit: Payer: Self-pay | Admitting: Family Medicine

## 2020-08-09 NOTE — Progress Notes (Signed)
Stacey Hanson     MRN: [id number]      DOB: 1945-10-18   HPI Stacey Hanson is here for follow up and re-evaluation of chronic medical conditions, medication management and review of any available recent lab and radiology data.  Preventive health is updated, specifically  Cancer screening and Immunization.   The PT denies any adverse reactions to current medications since the last visit.  There are no new concerns.  There are no specific complaints   ROS Denies recent fever or chills. Denies sinus pressure, nasal congestion, ear pain or sore throat. Denies chest congestion, productive cough or wheezing. Denies chest pains, palpitations and leg swelling Denies abdominal pain, nausea, vomiting,diarrhea or constipation.   Denies dysuria, frequency, hesitancy or incontinence. Chronic back pain radiating down legs Denies Dheadaches, seizures, numbness, or tingling. Denies depression, anxiety or insomnia. Denies skin break down or rash.   PE  BP (!) 142/80   Pulse (!) 101   Temp 99.6 F (37.6 C) (Oral)   Resp 20   Ht 5\' 7"  (1.702 m)   Wt 225 lb 6.4 oz (102.2 kg)   SpO2 98%   BMI 35.30 kg/m   Patient alert and oriented and in no cardiopulmonary distress.  HEENT: No facial asymmetry, EOMI,     Neck supple .  Chest: Clear to auscultation bilaterally.  CVS: S1, S2 no murmurs, no S3.Regular rate.  ABD: Soft non tender.   Ext: No edema  MS: decreased ROM spine, shoulders, hips and knees.  Skin: Intact, no ulcerations or rash noted.  Psych: Good eye contact, normal affect. Memory intact not anxious or depressed appearing.  CNS: CN 2-12 intact, power,  normal throughout.no focal deficits noted.   Assessment & Plan  Essential (primary) hypertension Improved, not yet at goal, no med change DASH diet and commitment to daily physical activity for a minimum of 30 minutes discussed and encouraged, as a part of hypertension management. The importance of attaining a healthy  weight is also discussed.  BP/Weight 08/08/2020 06/26/2020 04/19/2020 03/06/2020 02/15/2020 02/14/2020 02/63/7858  Systolic BP 850 277 412 878 676 720 947  Diastolic BP 80 80 83 82 72 75 72  Wt. (Lbs) 225.4 227 225 229.12 221 226.2 221.04  BMI 35.3 35.55 35.24 35.89 34.61 35.43 34.62       Type 2 diabetes mellitus with vascular disease ([facility]) Controlled, no change in medication [name] is reminded of the importance of commitment to daily physical activity for 30 minutes or more, as able and the need to limit carbohydrate intake to 30 to 60 grams per meal to help with blood sugar control.   The need to take medication as prescribed, test blood sugar as directed, and to call between visits if there is a concern that blood sugar is uncontrolled is also discussed.   [name] is reminded of the importance of daily foot exam, annual eye examination, and good blood sugar, blood pressure and cholesterol control.  Diabetic Labs Latest Ref Rng & Units 06/26/2020 04/19/2020 04/11/2020 12/18/2019 09/15/2019  HbA1c 0.0 - 7.0 % - 7.9(A) - 7.3(A) 8.4(A)  Microalbumin mg/L - 10 - - -  Micro/Creat Ratio 0 - 29 mg/g creat <7 - - - -  Chol 100 - 199 mg/dL - - 169 - -  HDL >39 mg/dL - - 52 - -  Calc LDL 0 - 99 mg/dL - - 100(H) - -  Triglycerides 0 - 149 mg/dL - - 92 - -  Creatinine 0.57 -  1.00 mg/dL - - 1.28(H) - -   BP/Weight 08/08/2020 06/26/2020 04/19/2020 03/06/2020 02/15/2020 02/14/2020 56/38/9373  Systolic BP 428 768 115 726 203 559 741  Diastolic BP 80 80 83 82 72 75 72  Wt. (Lbs) 225.4 227 225 229.12 221 226.2 221.04  BMI 35.3 35.55 35.24 35.89 34.61 35.43 34.62   Foot/eye exam completion dates Latest Ref Rng & Units 03/06/2020 05/25/2019  Eye Exam No Retinopathy - -  Foot exam Order - - -  Foot Form Completion - Done Done        Hypothyroidism following radioiodine therapy Controlled, no change in medication   Back pain Unchanged, awaiting neurosurgery consultation  Combined fat and  carbohydrate induced hyperlipemia Hyperlipidemia:Low fat diet discussed and encouraged.   Lipid Panel  Lab Results  Component Value Date   CHOL 169 04/11/2020   HDL 52 04/11/2020   LDLCALC 100 (H) 04/11/2020   TRIG 92 04/11/2020   CHOLHDL 3.3 04/11/2020   Needs to lower fat intake Updated lab needed at/ before next visit.

## 2020-08-09 NOTE — Assessment & Plan Note (Signed)
Controlled, no change in medication  

## 2020-08-09 NOTE — Assessment & Plan Note (Signed)
Hyperlipidemia:Low fat diet discussed and encouraged.   Lipid Panel  Lab Results  Component Value Date   CHOL 169 04/11/2020   HDL 52 04/11/2020   LDLCALC 100 (H) 04/11/2020   TRIG 92 04/11/2020   CHOLHDL 3.3 04/11/2020   Needs to lower fat intake Updated lab needed at/ before next visit.

## 2020-08-09 NOTE — Assessment & Plan Note (Signed)
Unchanged, awaiting neurosurgery consultation

## 2020-08-09 NOTE — Assessment & Plan Note (Signed)
Controlled, no change in medication Stacey Hanson is reminded of the importance of commitment to daily physical activity for 30 minutes or more, as able and the need to limit carbohydrate intake to 30 to 60 grams per meal to help with blood sugar control.   The need to take medication as prescribed, test blood sugar as directed, and to call between visits if there is a concern that blood sugar is uncontrolled is also discussed.   Stacey Hanson is reminded of the importance of daily foot exam, annual eye examination, and good blood sugar, blood pressure and cholesterol control.  Diabetic Labs Latest Ref Rng & Units 06/26/2020 04/19/2020 04/11/2020 12/18/2019 09/15/2019  HbA1c 0.0 - 7.0 % - 7.9(A) - 7.3(A) 8.4(A)  Microalbumin mg/L - 10 - - -  Micro/Creat Ratio 0 - 29 mg/g creat <7 - - - -  Chol 100 - 199 mg/dL - - 169 - -  HDL >39 mg/dL - - 52 - -  Calc LDL 0 - 99 mg/dL - - 100(H) - -  Triglycerides 0 - 149 mg/dL - - 92 - -  Creatinine 0.57 - 1.00 mg/dL - - 1.28(H) - -   BP/Weight 08/08/2020 06/26/2020 04/19/2020 03/06/2020 02/15/2020 02/14/2020 26/71/2458  Systolic BP 099 833 825 053 976 734 193  Diastolic BP 80 80 83 82 72 75 72  Wt. (Lbs) 225.4 227 225 229.12 221 226.2 221.04  BMI 35.3 35.55 35.24 35.89 34.61 35.43 34.62   Foot/eye exam completion dates Latest Ref Rng & Units 03/06/2020 05/25/2019  Eye Exam No Retinopathy - -  Foot exam Order - - -  Foot Form Completion - Done Done

## 2020-08-09 NOTE — Assessment & Plan Note (Signed)
Improved, not yet at goal, no med change DASH diet and commitment to daily physical activity for a minimum of 30 minutes discussed and encouraged, as a part of hypertension management. The importance of attaining a healthy weight is also discussed.  BP/Weight 08/08/2020 06/26/2020 04/19/2020 03/06/2020 02/15/2020 02/14/2020 94/99/7182  Systolic BP 099 068 934 068 403 353 317  Diastolic BP 80 80 83 82 72 75 72  Wt. (Lbs) 225.4 227 225 229.12 221 226.2 221.04  BMI 35.3 35.55 35.24 35.89 34.61 35.43 34.62

## 2020-08-12 DIAGNOSIS — E890 Postprocedural hypothyroidism: Secondary | ICD-10-CM | POA: Diagnosis not present

## 2020-08-12 DIAGNOSIS — E1165 Type 2 diabetes mellitus with hyperglycemia: Secondary | ICD-10-CM | POA: Diagnosis not present

## 2020-08-13 LAB — COMPREHENSIVE METABOLIC PANEL
ALT: 21 IU/L (ref 0–32)
AST: 21 IU/L (ref 0–40)
Albumin/Globulin Ratio: 1.5 (ref 1.2–2.2)
Albumin: 4.7 g/dL (ref 3.7–4.7)
Alkaline Phosphatase: 122 IU/L — ABNORMAL HIGH (ref 44–121)
BUN/Creatinine Ratio: 19 (ref 12–28)
BUN: 23 mg/dL (ref 8–27)
Bilirubin Total: 0.4 mg/dL (ref 0.0–1.2)
CO2: 21 mmol/L (ref 20–29)
Calcium: 10.4 mg/dL — ABNORMAL HIGH (ref 8.7–10.3)
Chloride: 96 mmol/L (ref 96–106)
Creatinine, Ser: 1.21 mg/dL — ABNORMAL HIGH (ref 0.57–1.00)
Globulin, Total: 3.2 g/dL (ref 1.5–4.5)
Glucose: 284 mg/dL — ABNORMAL HIGH (ref 65–99)
Potassium: 4.4 mmol/L (ref 3.5–5.2)
Sodium: 137 mmol/L (ref 134–144)
Total Protein: 7.9 g/dL (ref 6.0–8.5)
eGFR: 47 mL/min/{1.73_m2} — ABNORMAL LOW (ref >59)

## 2020-08-13 LAB — T4, FREE: Free T4: 1.42 ng/dL (ref 0.82–1.77)

## 2020-08-13 LAB — TSH: TSH: 0.307 u[IU]/mL — ABNORMAL LOW (ref 0.450–4.500)

## 2020-08-19 ENCOUNTER — Other Ambulatory Visit: Payer: Self-pay

## 2020-08-19 ENCOUNTER — Ambulatory Visit: Payer: Medicare Other | Admitting: Nurse Practitioner

## 2020-08-19 ENCOUNTER — Encounter: Payer: Self-pay | Admitting: Nurse Practitioner

## 2020-08-19 ENCOUNTER — Ambulatory Visit (HOSPITAL_COMMUNITY): Payer: Medicare Other

## 2020-08-19 VITALS — BP 150/89 | HR 101 | Ht 67.0 in | Wt 223.0 lb

## 2020-08-19 DIAGNOSIS — I10 Essential (primary) hypertension: Secondary | ICD-10-CM | POA: Diagnosis not present

## 2020-08-19 DIAGNOSIS — E1165 Type 2 diabetes mellitus with hyperglycemia: Secondary | ICD-10-CM | POA: Diagnosis not present

## 2020-08-19 DIAGNOSIS — E890 Postprocedural hypothyroidism: Secondary | ICD-10-CM | POA: Diagnosis not present

## 2020-08-19 DIAGNOSIS — E782 Mixed hyperlipidemia: Secondary | ICD-10-CM

## 2020-08-19 LAB — POCT GLYCOSYLATED HEMOGLOBIN (HGB A1C): Hemoglobin A1C: 8.3 % — AB (ref 4.0–5.6)

## 2020-08-19 NOTE — Progress Notes (Signed)
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    Knee pain    Obesity    Past Surgical History:  Procedure Laterality Date   ABDOMINAL HYSTERECTOMY  1970   CATARACT EXTRACTION W/PHACO Left 03/09/2019   Procedure: CATARACT EXTRACTION PHACO AND INTRAOCULAR LENS PLACEMENT ([place]);  Surgeon: Baruch Goldmann, MD;  Location: [facility];  Service: Ophthalmology;  Laterality: Left;  CDE: 9.08   CATARACT EXTRACTION W/PHACO Right 03/27/2019   Procedure: CATARACT EXTRACTION PHACO AND INTRAOCULAR LENS PLACEMENT RIGHT EYE;  Surgeon: Baruch Goldmann, MD;  Location: [facility];  Service: Ophthalmology;  Laterality: Right;  CDE: 7.47   CHOLECYSTECTOMY  OCT 2010 BZ BILIARY DYSKINESIA   CHRONIC CHOLECYSTITIS   COLONOSCOPY  MAY 2009 SCREENING   pTC TICS, [facility]   COLONOSCOPY N/A 08/11/2017   Procedure: COLONOSCOPY;  Surgeon: Danie Binder, MD;  Location: [facility];  Service: Endoscopy;  Laterality: N/A;  9:30am   ESOPHAGOGASTRODUODENOSCOPY  10/03/2010   Procedure: ESOPHAGOGASTRODUODENOSCOPY (EGD);  Surgeon: Dorothyann Peng, MD;  Location: [facility];  Service: Endoscopy;  Laterality: N/A;   ESOPHAGOGASTRODUODENOSCOPY  12/19/2002   RMR: Normal esophagus/Normal stomach/ Duodenum large bulbar diverticulum, 1 cm bulbar ulcer with surrounding/ inflammation as described above. Normal D2   POLYPECTOMY  08/11/2017   Procedure: POLYPECTOMY;  Surgeon: Danie Binder, MD;  Location: [facility];  Service: Endoscopy;;  cecal    SAVORY DILATION  10/03/2010   Procedure:  SAVORY DILATION;  Surgeon: Dorothyann Peng, MD;  Location: [facility];  Service: Endoscopy;;   TOTAL ABDOMINAL HYSTERECTOMY  1970   TOTAL KNEE ARTHROPLASTY Left 12/21/2017   Procedure: LEFT TOTAL KNEE ARTHROPLASTY;  Surgeon: Paralee Cancel, MD;  Location: [facility];  Service: Orthopedics;  Laterality: Left;  70 mins block   UPPER GASTROINTESTINAL ENDOSCOPY  OCT 2004 RMR   DUO TIC, & ULCER   Social History   Socioeconomic History   Marital status: Single    Spouse name: Not on file   Number of children: 3   Years of education: 10   Highest education level: 10th grade  Occupational History   Occupation: [employer]   Occupation: Retired   Tobacco Use   Smoking status: Former    Packs/day: 0.25    Years: 10.00    Pack years: 2.50    Types: Cigarettes    Quit date: 02/20/1985    Years since quitting: 35.5   Smokeless tobacco: Never  Vaping Use   Vaping Use: Never used  Substance and Sexual Activity   Alcohol use: No   Drug use: No   Sexual activity: Not Currently  Other Topics Concern   Not on file  Social History Narrative   Lives alone    Social Determinants of Health   Financial Resource Strain: Low Risk    Difficulty of Paying Living Expenses: Not hard at all  Food Insecurity: No Food Insecurity   Worried About Charity fundraiser  in the Last Year: Never true   [place] in the Last Year: Never true  Transportation Needs: No Transportation Needs   Lack of Transportation (Medical): No   Lack of Transportation (Non-Medical): No  Physical Activity: Insufficiently Active   Days of Exercise per Week: 2 days   Minutes of Exercise per Session: 10 min  Stress: No Stress Concern Present   Feeling of Stress : Not at all  Social Connections: Socially Isolated   Frequency of Communication with Friends and Family: More than three times a week   Frequency of Social Gatherings with Friends and Family: More than three times a week   Attends Religious Services: Never   Building surveyor or Organizations: No   Attends Archivist Meetings: Never   Marital Status: Never married   Outpatient Encounter Medications as of 08/19/2020  Medication Sig   amLODipine (NORVASC) 2.5 MG tablet Take 1 tablet (2.5 mg total) by mouth daily.   aspirin EC 81 MG tablet Take 81 mg by mouth daily.   B-D UF III MINI PEN NEEDLES 31G X 5 MM MISC USE FOR TWICE DAILY INJECTIONS OF HUMALOG   B-D ULTRAFINE III SHORT PEN 31G X 8 MM MISC USE TO INJECT INSULIN TWICE DAILY   benazepril (LOTENSIN) 40 MG tablet TAKE 1 TABLET(40 MG) BY MOUTH DAILY   Blood Glucose Monitoring Suppl (ONE TOUCH ULTRA 2) w/Device KIT Twice daily testing dx e11.65   Calcium Carb-Cholecalciferol (CALCIUM 600/VITAMIN D3 PO) Take 2 tablets by mouth daily.   glipiZIDE (GLUCOTROL XL) 5 MG 24 hr tablet TAKE 1 TABLET(5 MG) BY MOUTH DAILY WITH BREAKFAST   glucose blood (ONETOUCH ULTRA) test strip USE 1 STRIP TO CHECK GLUCOSE TWICE DAILY   HUMALOG MIX 75/25 KWIKPEN (75-25) 100 UNIT/ML Kwikpen ADMINISTER 40 UNITS UNDER THE SKIN TWICE DAILY   hydrochlorothiazide (HYDRODIURIL) 25 MG tablet TAKE 1 TABLET(25 MG) BY MOUTH DAILY   Lancets (ONETOUCH DELICA PLUS [id number]) MISC USE 1 LANCET  TO CHECK GLUCOSE TWICE DAILY AS DIRECTED   levothyroxine (SYNTHROID) 88 MCG tablet TAKE 1 TABLET(88 MCG) BY MOUTH DAILY BEFORE BREAKFAST   metFORMIN (GLUCOPHAGE) 500 MG tablet TAKE 1 TABLET(500 MG) BY MOUTH  DAILY WITH A MEAL   Multiple Vitamin (MULTIVITAMIN) capsule Take 1 capsule by mouth daily.   omeprazole (PRILOSEC) 20 MG capsule TAKE 1 CAPSULE BY MOUTH EVERY MORNING   pravastatin (PRAVACHOL) 40 MG tablet TAKE 1 TABLET(40 MG) BY MOUTH DAILY   UNABLE TO FIND Diabetic shoes x 1 pair, Inserts x 3 pair DX E11.9   No facility-administered encounter medications on file as of 08/19/2020.   ALLERGIES: Allergies  Allergen Reactions   Simvastatin Other (See Comments)    Muscle cramps   VACCINATION STATUS: Immunization History   Administered Date(s) Administered   Fluad Quad(high Dose 65+) 11/23/2018, 12/21/2019   Influenza,inj,Quad PF,6+ Mos 11/16/2012, 11/15/2013, 11/14/2014, 11/14/2015, 11/03/2016, 11/09/2017   Influenza,inj,quad, With Preservative 11/02/2016   Moderna SARS-COV2 Booster Vaccination 07/24/2020   Moderna Sars-Covid-2 Vaccination 04/27/2019, 05/26/2019, 01/08/2020   Pneumococcal Conjugate-13 10/11/2013   Pneumococcal Polysaccharide-23 10/07/2011   Zoster, Live 04/05/2006    Hypertension This is a chronic problem. The current episode started more than 1 year ago. The problem has been waxing and waning since onset. The problem is uncontrolled. Pertinent negatives include no blurred vision, headaches, palpitations, shortness of breath or sweats. Agents associated with hypertension include thyroid hormones. Risk factors for coronary artery disease include diabetes mellitus, dyslipidemia, obesity, sedentary lifestyle  and smoking/tobacco exposure. Past treatments include ACE inhibitors, diuretics and calcium channel blockers. The current treatment provides mild improvement. There are no compliance problems.  Hypertensive end-organ damage includes kidney disease. Identifiable causes of hypertension include a thyroid problem.  Diabetes She presents for her follow-up diabetic visit. She has type 2 diabetes mellitus. Onset time: She was diagnosed at approximate age of 62 years. Her disease course has been worsening. Pertinent negatives for hypoglycemia include no confusion, headaches, nervousness/anxiousness, sweats or tremors. Pertinent negatives for diabetes include no blurred vision, no fatigue, no foot paresthesias, no polydipsia, no polyphagia and no polyuria. There are no hypoglycemic complications. Symptoms are stable. Diabetic complications include nephropathy. Risk factors for coronary artery disease include dyslipidemia, diabetes mellitus, obesity, hypertension, sedentary lifestyle, post-menopausal and  tobacco exposure. Current diabetic treatment includes insulin injections and oral agent (dual therapy). She is compliant with treatment most of the time. Her weight is fluctuating minimally. She is following a generally unhealthy diet. When asked about meal planning, she reported none. She has not had a previous visit with a dietitian. She rarely participates in exercise. Her home blood glucose trend is fluctuating minimally. Her breakfast blood glucose range is generally 140-180 mg/dl. Her dinner blood glucose range is generally 180-200 mg/dl. (She presents today with her meter and logs showing slightly above target fasting and postprandial glycemic profile overall.  Her POCT A1c today is 8.3%, increasing from previous visit of 7.9%.  She has continued to adjust her insulin dose based on her glucose levels and whether the pharmacy can refill her script or not.  Most recently, she has been taking 40 units BID and is doing well on that dose.  There are no significant episodes of hypoglycemia documented or reported.) An ACE inhibitor/angiotensin II receptor blocker is being taken. She does not see a podiatrist.Eye exam is current.  Thyroid Problem Presents for follow-up (-She is also status post radioactive iodine treatment for toxic nodule on October 17, 2010.  She has had  history of multinodular goiter status post fine-needle aspiration with benign findings.) visit. Patient reports no anxiety, cold intolerance, constipation, depressed mood, diarrhea, fatigue, heat intolerance, leg swelling, palpitations or tremors. The symptoms have been stable.     Review of systems  Constitutional: + Minimally fluctuating body weight,  current Body mass index is 34.93 kg/m. , no fatigue, no subjective hyperthermia, no subjective hypothermia Eyes: no blurry vision, no xerophthalmia ENT: no sore throat, no nodules palpated in throat, no dysphagia/odynophagia, no hoarseness Cardiovascular: no chest pain, no shortness of  breath, no palpitations, no leg swelling Respiratory: no cough, no shortness of breath Gastrointestinal: no nausea/vomiting/diarrhea Musculoskeletal: complains of left hip and back pain- sees neurologist this week Skin: no rashes, no hyperemia Neurological: no tremors, no numbness, no tingling, no dizziness Psychiatric: no depression, no anxiety    Objective:    BP (!) 150/89   Pulse (!) 101   Ht _0  (1.702 m)   Wt 223 lb (101.2 kg)   BMI 34.93 kg/m   Wt Readings from Last 3 Encounters:  08/19/20 223 lb (101.2 kg)  08/08/20 225 lb 6.4 oz (102.2 kg)  06/26/20 227 lb (103 kg)    BP Readings from Last 3 Encounters:  08/19/20 (!) 150/89  08/08/20 (!) 142/80  06/26/20 140/80      Physical Exam- Limited  Constitutional:  Body mass index is 34.93 kg/m. , not in acute distress, normal state of mind Eyes:  EOMI, no exophthalmos Neck: Supple Cardiovascular: RRR, no  murmurs, rubs, or gallops, no edema Respiratory: Adequate breathing efforts, no crackles, rales, rhonchi, or wheezing Musculoskeletal: no gross deformities, strength intact in all four extremities, no gross restriction of joint movements Skin:  no rashes, no hyperemia Neurological: no tremor with outstretched hands     CMP     Component Value Date/Time   NA 137 08/12/2020 0943   K 4.4 08/12/2020 0943   CL 96 08/12/2020 0943   CO2 21 08/12/2020 0943   GLUCOSE 284 (H) 08/12/2020 0943   GLUCOSE 412 (H) 03/07/2019 0940   BUN 23 08/12/2020 0943   CREATININE 1.21 (H) 08/12/2020 0943   CREATININE 0.96 (H) 03/12/2016 0925   CALCIUM 10.4 (H) 08/12/2020 0943   PROT 7.9 08/12/2020 0943   ALBUMIN 4.7 08/12/2020 0943   AST 21 08/12/2020 0943   ALT 21 08/12/2020 0943   ALKPHOS 122 (H) 08/12/2020 0943   BILITOT 0.4 08/12/2020 0943   GFRNONAA 41 (L) 04/11/2020 1007   GFRNONAA 60 03/12/2016 0925   GFRAA 48 (L) 04/11/2020 1007   GFRAA 69 03/12/2016 0925     Diabetic Labs (most recent): Lab Results  Component  Value Date   HGBA1C 8.3 (A) 08/19/2020   HGBA1C 7.9 (A) 04/19/2020   HGBA1C 7.3 (A) 12/18/2019     Lipid Panel ( most recent) Lipid Panel     Component Value Date/Time   CHOL 169 04/11/2020 1007   TRIG 92 04/11/2020 1007   HDL 52 04/11/2020 1007   CHOLHDL 3.3 04/11/2020 1007   CHOLHDL 2.2 01/11/2016 0916   VLDL 11 01/11/2016 0916   LDLCALC 100 (H) 04/11/2020 1007    Assessment & Plan:   1) Uncontrolled type 2 diabetes mellitus with stage 3 chronic kidney disease, with long-term current use of insulin ([place])  -She will continue to need insulin treatment in order for her to achieve control of diabetes to target.    She presents today with her meter and logs showing slightly above target fasting and postprandial glycemic profile overall.  Her POCT A1c today is 8.3%, increasing from previous visit of 7.9%.  She has continued to adjust her insulin dose based on her glucose levels and whether the pharmacy can refill her script or not.  Most recently, she has been taking 40 units BID and is doing well on that dose.  There are no significant episodes of hypoglycemia documented or reported.  - Nutritional counseling repeated at each appointment due to patients tendency to fall back in to old habits.  - The patient admits there is a room for improvement in their diet and drink choices. -  Suggestion is made for the patient to avoid simple carbohydrates from their diet including Cakes, Sweet Desserts / Pastries, Ice Cream, Soda (diet and regular), Sweet Tea, Candies, Chips, Cookies, Sweet Pastries, Store Bought Juices, Alcohol in Excess of 1-2 drinks a day, Artificial Sweeteners, Coffee Creamer, and "Sugar-free" Products. This will help patient to have stable blood glucose profile and potentially avoid unintended weight gain.   - I encouraged the patient to switch to unprocessed or minimally processed complex starch and increased protein intake (animal or plant source), fruits, and vegetables.    - Patient is advised to stick to a routine mealtimes to eat 3 meals a day and avoid unnecessary snacks (to snack only to correct hypoglycemia).  -She is responding well to Humalog 75/25.  Based on her recent glucose profile, she is advised to continue her Humalog 75/25 at 40 units BID with meals if  glucose is above 90 and she is eating. She can also continue her Metformin 500 mg po once daily with breakfast (renal function stable) and Glipizide 5 mg XL daily with breakfast.   -She is advised to continue monitoring blood glucose at least 3 times daily, before injecting insulin at breakfast and supper, and before bed.  She is to contact the clinic if she has readings less than 70 or greater than 200 for 3 tests in a row.  2) Hypothyroidism- s/p RAI -Her previsit thyroid function tests are consistent with appropriate hormone replacement.  She is advised to continue Levothyroxine 88 mcg po daily before breakfast.     - We discussed about the correct intake of her thyroid hormone, on empty stomach at fasting, with water, separated by at least 30 minutes from breakfast and other medications,  and separated by more than 4 hours from calcium, iron, multivitamins, acid reflux medications (PPIs). -Patient is made aware of the fact that thyroid hormone replacement is needed for life, dose to be adjusted by periodic monitoring of thyroid function tests.  3) Weight management:  Her Body mass index is 34.93 kg/m.-a candidate for modest weight loss.  Patient specific carbs restrictions and exercise regimen discussed with her.  4) Blood pressure/ Hypertension: -Her blood pressure is not controlled to target today.  She is advised to continue Benazepril 40 mg po daily, Norvasc 2.5 mg po daily, and HCTZ 25 mg po daily.  She says her BP is always elevated at doctors appointments.  5) Hyperlipidemia: Her recent lipid panel from 04/11/20 showed uncontrolled LDL of 100.  She is advised to continue Pravastatin 40 mg  po daily at bedtime.  Side effects and precautions discussed with her.  - I advised patient to maintain close follow up with Fayrene Helper, MD for diabetes and all other primary care needs.     I spent 30 minutes in the care of the patient today including review of labs from [facility], Lipids, Thyroid Function, Hematology (current and previous including abstractions from other facilities); face-to-face time discussing  her blood glucose readings/logs, discussing hypoglycemia and hyperglycemia episodes and symptoms, medications doses, her options of short and long term treatment based on the latest standards of care / guidelines;  discussion about incorporating lifestyle medicine;  and documenting the encounter.    Please refer to Patient Instructions for Blood Glucose Monitoring and Insulin/Medications Dosing Guide"  in media tab for additional information. Please  also refer to " Patient Self Inventory" in the Media  tab for reviewed elements of pertinent patient history.  Stacey Hanson participated in the discussions, expressed understanding, and voiced agreement with the above plans.  All questions were answered to her satisfaction. she is encouraged to contact clinic should she have any questions or concerns prior to her return visit.    Follow up plan: Return in about 4 months (around 12/19/2020) for Diabetes F/U with A1c in office, Thyroid follow up, Previsit labs, Bring meter and logs.    Rayetta Pigg, [facility] [address] [place], [place] [address] Phone: [phone] Fax: [phone]  08/19/2020, 1:27 PM

## 2020-08-19 NOTE — Patient Instructions (Signed)
Diabetes Mellitus and Nutrition, Adult When you have diabetes, or diabetes mellitus, it is very important to have healthy eating habits because your blood sugar (glucose) levels are greatly affected by what you eat and drink. Eating healthy foods in the right amounts, at about the same times every day, can help you:  Control your blood glucose.  Lower your risk of heart disease.  Improve your blood pressure.  Reach or maintain a healthy weight. What can affect my meal plan? Every person with diabetes is different, and each person has different needs for a meal plan. Your health care provider may recommend that you work with a dietitian to make a meal plan that is best for you. Your meal plan may vary depending on factors such as:  The calories you need.  The medicines you take.  Your weight.  Your blood glucose, blood pressure, and cholesterol levels.  Your activity level.  Other health conditions you have, such as heart or kidney disease. How do carbohydrates affect me? Carbohydrates, also called carbs, affect your blood glucose level more than any other type of food. Eating carbs naturally raises the amount of glucose in your blood. Carb counting is a method for keeping track of how many carbs you eat. Counting carbs is important to keep your blood glucose at a healthy level, especially if you use insulin or take certain oral diabetes medicines. It is important to know how many carbs you can safely have in each meal. This is different for every person. Your dietitian can help you calculate how many carbs you should have at each meal and for each snack. How does alcohol affect me? Alcohol can cause a sudden decrease in blood glucose (hypoglycemia), especially if you use insulin or take certain oral diabetes medicines. Hypoglycemia can be a life-threatening condition. Symptoms of hypoglycemia, such as sleepiness, dizziness, and confusion, are similar to symptoms of having too much  alcohol.  Do not drink alcohol if: ? Your health care provider tells you not to drink. ? You are pregnant, may be pregnant, or are planning to become pregnant.  If you drink alcohol: ? Do not drink on an empty stomach. ? Limit how much you use to:  0-1 drink a day for women.  0-2 drinks a day for men. ? Be aware of how much alcohol is in your drink. In the [place] one drink equals one 12 oz bottle of beer (355 mL), one 5 oz glass of wine (148 mL), or one 1 oz glass of hard liquor (44 mL). ? Keep yourself hydrated with water, diet soda, or unsweetened iced tea.  Keep in mind that regular soda, juice, and other mixers may contain a lot of sugar and must be counted as carbs. What are tips for following this plan? Reading food labels  Start by checking the serving size on the "Nutrition Facts" label of packaged foods and drinks. The amount of calories, carbs, fats, and other nutrients listed on the label is based on one serving of the item. Many items contain more than one serving per package.  Check the total grams (g) of carbs in one serving. You can calculate the number of servings of carbs in one serving by dividing the total carbs by 15. For example, if a food has 30 g of total carbs per serving, it would be equal to 2 servings of carbs.  Check the number of grams (g) of saturated fats and trans fats in one serving. Choose foods that have   a low amount or none of these fats.  Check the number of milligrams (mg) of salt (sodium) in one serving. Most people should limit total sodium intake to less than 2,300 mg per day.  Always check the nutrition information of foods labeled as "low-fat" or "nonfat." These foods may be higher in added sugar or refined carbs and should be avoided.  Talk to your dietitian to identify your daily goals for nutrients listed on the label. Shopping  Avoid buying canned, pre-made, or processed foods. These foods tend to be high in fat, sodium, and added  sugar.  Shop around the outside edge of the grocery store. This is where you will most often find fresh fruits and vegetables, bulk grains, fresh meats, and fresh dairy. Cooking  Use low-heat cooking methods, such as baking, instead of high-heat cooking methods like deep frying.  Cook using healthy oils, such as olive, canola, or sunflower oil.  Avoid cooking with butter, cream, or high-fat meats. Meal planning  Eat meals and snacks regularly, preferably at the same times every day. Avoid going long periods of time without eating.  Eat foods that are high in fiber, such as fresh fruits, vegetables, beans, and whole grains. Talk with your dietitian about how many servings of carbs you can eat at each meal.  Eat 4-6 oz (112-168 g) of lean protein each day, such as lean meat, chicken, fish, eggs, or tofu. One ounce (oz) of lean protein is equal to: ? 1 oz (28 g) of meat, chicken, or fish. ? 1 egg. ?  cup (62 g) of tofu.  Eat some foods each day that contain healthy fats, such as avocado, nuts, seeds, and fish.   What foods should I eat? Fruits Berries. Apples. Oranges. Peaches. Apricots. Plums. Grapes. Mango. Papaya. Pomegranate. Kiwi. Cherries. Vegetables Lettuce. Spinach. Leafy greens, including kale, chard, collard greens, and mustard greens. Beets. Cauliflower. Cabbage. Broccoli. Carrots. Green beans. Tomatoes. Peppers. Onions. Cucumbers. Brussels sprouts. Grains Whole grains, such as whole-wheat or whole-grain bread, crackers, tortillas, cereal, and pasta. Unsweetened oatmeal. Quinoa. Brown or wild rice. Meats and other proteins Seafood. Poultry without skin. Lean cuts of poultry and beef. Tofu. Nuts. Seeds. Dairy Low-fat or fat-free dairy products such as milk, yogurt, and cheese. The items listed above may not be a complete list of foods and beverages you can eat. Contact a dietitian for more information. What foods should I avoid? Fruits Fruits canned with  syrup. Vegetables Canned vegetables. Frozen vegetables with butter or cream sauce. Grains Refined white flour and flour products such as bread, pasta, snack foods, and cereals. Avoid all processed foods. Meats and other proteins Fatty cuts of meat. Poultry with skin. Breaded or fried meats. Processed meat. Avoid saturated fats. Dairy Full-fat yogurt, cheese, or milk. Beverages Sweetened drinks, such as soda or iced tea. The items listed above may not be a complete list of foods and beverages you should avoid. Contact a dietitian for more information. Questions to ask a health care provider  Do I need to meet with a diabetes educator?  Do I need to meet with a dietitian?  What number can I call if I have questions?  When are the best times to check my blood glucose? Where to find more information:  American Diabetes Association: diabetes.org  Academy of Nutrition and Dietetics: www.eatright.org  National Institute of Diabetes and Digestive and Kidney Diseases: www.niddk.nih.gov  Association of Diabetes Care and Education Specialists: www.diabeteseducator.org Summary  It is important to have healthy eating   habits because your blood sugar (glucose) levels are greatly affected by what you eat and drink.  A healthy meal plan will help you control your blood glucose and maintain a healthy lifestyle.  Your health care provider may recommend that you work with a dietitian to make a meal plan that is best for you.  Keep in mind that carbohydrates (carbs) and alcohol have immediate effects on your blood glucose levels. It is important to count carbs and to use alcohol carefully. This information is not intended to replace advice given to you by your health care provider. Make sure you discuss any questions you have with your health care provider. Document Revised: 01/24/2019 Document Reviewed: 01/24/2019 Elsevier Patient Education  2021 Elsevier Inc.  

## 2020-08-20 LAB — HM DIABETES EYE EXAM

## 2020-08-22 DIAGNOSIS — M5416 Radiculopathy, lumbar region: Secondary | ICD-10-CM | POA: Diagnosis not present

## 2020-08-26 ENCOUNTER — Ambulatory Visit (HOSPITAL_COMMUNITY)
Admission: RE | Admit: 2020-08-26 | Discharge: 2020-08-26 | Disposition: A | Discharge status: Home / Self Care | Payer: Medicare Other | Source: Ambulatory Visit | Attending: Family Medicine | Admitting: Family Medicine

## 2020-08-26 ENCOUNTER — Other Ambulatory Visit: Payer: Self-pay

## 2020-08-26 DIAGNOSIS — Z1231 Encounter for screening mammogram for malignant neoplasm of breast: Secondary | ICD-10-CM | POA: Insufficient documentation

## 2020-08-29 DIAGNOSIS — M48061 Spinal stenosis, lumbar region without neurogenic claudication: Secondary | ICD-10-CM | POA: Diagnosis not present

## 2020-08-29 DIAGNOSIS — M5416 Radiculopathy, lumbar region: Secondary | ICD-10-CM | POA: Diagnosis not present

## 2020-09-02 ENCOUNTER — Other Ambulatory Visit: Payer: Self-pay | Admitting: Family Medicine

## 2020-09-03 ENCOUNTER — Ambulatory Visit (HOSPITAL_COMMUNITY): Payer: Medicare Other | Admitting: Physical Therapy

## 2020-09-13 ENCOUNTER — Other Ambulatory Visit: Payer: Self-pay | Admitting: Family Medicine

## 2020-09-18 ENCOUNTER — Other Ambulatory Visit: Payer: Self-pay

## 2020-09-18 ENCOUNTER — Encounter (HOSPITAL_COMMUNITY): Payer: Self-pay | Admitting: Physical Therapy

## 2020-09-18 ENCOUNTER — Ambulatory Visit (HOSPITAL_COMMUNITY): Payer: Medicare Other | Attending: Neurosurgery | Admitting: Physical Therapy

## 2020-09-18 DIAGNOSIS — R29898 Other symptoms and signs involving the musculoskeletal system: Secondary | ICD-10-CM | POA: Insufficient documentation

## 2020-09-18 DIAGNOSIS — R2689 Other abnormalities of gait and mobility: Secondary | ICD-10-CM | POA: Diagnosis not present

## 2020-09-18 DIAGNOSIS — M545 Low back pain, unspecified: Secondary | ICD-10-CM | POA: Diagnosis not present

## 2020-09-18 DIAGNOSIS — M6281 Muscle weakness (generalized): Secondary | ICD-10-CM | POA: Insufficient documentation

## 2020-09-18 NOTE — Patient Instructions (Signed)
Access Code: [email or web address] URL: [email or web address] Date: 09/18/2020 Prepared by: Mitzi Hansen Polo Mcmartin  Exercises Standing Lumbar Extension - 3 x daily - 7 x weekly - 3 sets - 10 reps Sit to Stand with Arms Crossed - 2 x daily - 7 x weekly - 2 sets - 10 reps

## 2020-09-18 NOTE — Therapy (Signed)
[place], [place], [address] Phone: [phone]   Fax:  [phone]  Physical Therapy Evaluation  Patient Details  Name: Stacey Hanson MRN: [id number] Date of Birth: 28-Jul-1945 Referring Provider (PT): Fenton Malling NP   Encounter Date: 09/18/2020   PT End of Session - 09/18/20 0951     Visit Number 1    Number of Visits 12    Date for PT Re-Evaluation 10/30/20    Authorization Type UHC Medicare (no auth, No VL)    Progress Note Due on Visit 10    PT Start Time 0915    PT Stop Time 0949    PT Time Calculation (min) 34 min    Activity Tolerance Patient tolerated treatment well    Behavior During Therapy [facility] for tasks assessed/performed             Past Medical History:  Diagnosis Date   BMI 30.0-30.9,adult 2012 215 LBS   2004 198 LBS   Diabetes mellitus    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    Knee pain    Obesity     Past Surgical History:  Procedure Laterality Date   ABDOMINAL HYSTERECTOMY  1970   CATARACT EXTRACTION W/PHACO Left 03/09/2019   Procedure: CATARACT EXTRACTION PHACO AND INTRAOCULAR LENS PLACEMENT ([place]);  Surgeon: Baruch Goldmann, MD;  Location: [facility];  Service: Ophthalmology;  Laterality: Left;  CDE: 9.08   CATARACT EXTRACTION W/PHACO Right 03/27/2019   Procedure: CATARACT EXTRACTION PHACO AND INTRAOCULAR LENS PLACEMENT RIGHT EYE;  Surgeon: Baruch Goldmann, MD;  Location: [facility];  Service: Ophthalmology;  Laterality: Right;  CDE: 7.47   CHOLECYSTECTOMY  OCT 2010 BZ BILIARY DYSKINESIA   CHRONIC CHOLECYSTITIS   COLONOSCOPY  MAY 2009 SCREENING   pTC TICS, [facility]   COLONOSCOPY N/A 08/11/2017   Procedure: COLONOSCOPY;  Surgeon: Danie Binder, MD;  Location: [facility];   Service: Endoscopy;  Laterality: N/A;  9:30am   ESOPHAGOGASTRODUODENOSCOPY  10/03/2010   Procedure: ESOPHAGOGASTRODUODENOSCOPY (EGD);  Surgeon: Dorothyann Peng, MD;  Location: [facility];  Service: Endoscopy;  Laterality: N/A;   ESOPHAGOGASTRODUODENOSCOPY  12/19/2002   RMR: Normal esophagus/Normal stomach/ Duodenum large bulbar diverticulum, 1 cm bulbar ulcer with surrounding/ inflammation as described above. Normal D2   POLYPECTOMY  08/11/2017   Procedure: POLYPECTOMY;  Surgeon: Danie Binder, MD;  Location: [facility];  Service: Endoscopy;;  cecal    SAVORY DILATION  10/03/2010   Procedure: SAVORY DILATION;  Surgeon: Dorothyann Peng, MD;  Location: [facility];  Service: Endoscopy;;   TOTAL ABDOMINAL HYSTERECTOMY  1970   TOTAL KNEE ARTHROPLASTY Left 12/21/2017   Procedure: LEFT TOTAL KNEE ARTHROPLASTY;  Surgeon: Paralee Cancel, MD;  Location: [facility];  Service: Orthopedics;  Laterality: Left;  70 mins block   UPPER GASTROINTESTINAL ENDOSCOPY  OCT 2004 RMR   DUO TIC, & ULCER    There were no vitals filed for this visit.    Subjective Assessment - 09/18/20 0917     Subjective Patient is a 75 y.o. female who presents to physical therapy with c/o lumbar radiculopathy. Patient states low back pain with radicular pain into LLE to foot. She states issues on and off for years.  Symptoms began recently with insidious onset. Symptoms began with prolonged sitting, standing, walking, activity. She has not found any relieving factors. Patient states her main goal is to try to get better.    Pertinent History chronic back pain, L TKA    Limitations Lifting;Standing;Walking;House hold activities    Patient Stated Goals to get better    Currently in Pain? Yes    Pain Score 8     Pain Location Back    Pain Orientation Lower;Left    Pain Descriptors / Indicators Aching;Sharp;Throbbing;Tightness    Pain Type Chronic pain;Acute pain    Pain Onset More than a month ago    Pain Frequency Constant     Aggravating Factors  prolonged activity    Pain Relieving Factors nothing                [facility] PT Assessment - 09/18/20 0001       Assessment   Medical Diagnosis Lumbar Radiculopathy    Referring Provider (PT) Fenton Malling NP    Onset Date/Surgical Date 08/19/20    Prior Therapy for L TKA      Precautions   Precautions None      Restrictions   Weight Bearing Restrictions No      Balance Screen   Has the patient fallen in the past 6 months No    Has the patient had a decrease in activity level because of a fear of falling?  No    Is the patient reluctant to leave their home because of a fear of falling?  No      Prior Function   Level of Independence Independent    Vocation Retired      Charity fundraiser Status Within Functional Limits for tasks assessed      Observation/Other Assessments   Observations Trunk flexed, antalgic LLE with ambulation, without AD    Focus on Therapeutic Outcomes (FOTO)  61% function      Sensation   Light Touch Appears Intact    Additional Comments slighlty decreased L4 on L      ROM / Strength   AROM / PROM / Strength AROM;Strength      AROM   AROM Assessment Site Lumbar    Lumbar Flexion 25% limited    Lumbar Extension 25% limited, feels good, a little better in LLE    Lumbar - Right Side Bend 50% limited, favors flexion    Lumbar - Left Side Bend 50% limited, favors flexion    Lumbar - Right Rotation 50% limited    Lumbar - Left Rotation 50% limited      Strength   Strength Assessment Site Hip;Knee;Ankle    Right/Left Hip Right;Left    Right Hip Flexion 4+/5    Left Hip Flexion 4+/5    Right/Left Knee Right;Left    Right Knee Flexion 5/5    Right Knee Extension 5/5    Left Knee Flexion 5/5    Left Knee Extension 5/5    Right/Left Ankle Right;Left    Right Ankle Dorsiflexion 5/5    Left Ankle Dorsiflexion 5/5      Transfers   Five time sit to stand comments  14.92 seconds with UE use       Ambulation/Gait   Ambulation/Gait Yes    Ambulation/Gait Assistance 6: Modified independent (Device/Increase time)    Ambulation Distance (Feet) 226 Feet    Assistive device None    Gait Pattern Trunk flexed    Ambulation Surface  Level;Indoor    Gait velocity decreased    Gait Comments 2MWT; had to quit at 1:30 due to pain and fatigue  in hips and low back                        Objective measurements completed on examination: See above findings.       [facility] Adult PT Treatment/Exercise - 09/18/20 0001       Exercises   Exercises Lumbar      Lumbar Exercises: Stretches   Standing Extension 10 reps      Lumbar Exercises: Seated   Sit to Stand 10 reps                    PT Education - 09/18/20 0917     Education Details Patient educated on exam findings, POC, scope of PT, HEP, dosing of extension exercises    Person(s) Educated Patient    Methods Explanation;Handout    Comprehension Verbalized understanding              PT Short Term Goals - 09/18/20 0955       PT SHORT TERM GOAL #1   Title Patient will be independent with HEP in order to improve functional outcomes.    Time 3    Period Weeks    Status New    Target Date 10/09/20      PT SHORT TERM GOAL #2   Title Patient will report at least 25% improvement in symptoms for improved quality of life.    Time 3    Period Weeks    Status New    Target Date 10/09/20               PT Long Term Goals - 09/18/20 0956       PT LONG TERM GOAL #1   Title Patient will report at least 75% improvement in symptoms for improved quality of life.    Time 6    Period Weeks    Status New    Target Date 10/30/20      PT LONG TERM GOAL #2   Title Patient will improve FOTO score by at least 5 points in order to indicate improved tolerance to activity.    Time 6    Period Weeks    Status New    Target Date 10/30/20      PT LONG TERM GOAL #3   Title Patient will be able to complete 5x  STS in under 11.4 seconds in order to reduce the risk of falls.    Time 6    Period Weeks    Status New    Target Date 10/30/20      PT LONG TERM GOAL #4   Title Patient will report centralized symptoms with LBP no greater than 4/10 for improved ability to compelte chores.    Time 6    Period Weeks    Status New    Target Date 10/30/20                    Plan - 09/18/20 9024     Clinical Impression Statement Patient is a 75 y.o. female who presents to physical therapy with c/o lumbar radiculopathy. She presents with pain limited deficits in lumbar strength, ROM, endurance, postural impairments, gait,  and functional mobility with ADL. She is having to modify and restrict ADL as indicated by FOTO score as well as subjective information  and objective measures which is affecting overall participation. Patient will benefit from skilled physical therapy in order to improve function and reduce impairment. Patient stating slight decrease in symptoms following repeated extension.    Personal Factors and Comorbidities Fitness;Past/Current Experience;Comorbidity 2;Time since onset of injury/illness/exacerbation    Comorbidities Anxiety , Arthritis, Back pain, BMI over 30, Diabetes, chronic back pain    Examination-Activity Limitations Locomotion Level;Transfers;Stand;Stairs;Squat;Lift;Carry;Caring for Others;Bend    Examination-Participation Restrictions Cleaning;Meal Prep;Community Activity;Shop;Volunteer;Dorita Sciara    Stability/Clinical Decision Making Evolving/Moderate complexity    Clinical Decision Making Low    Rehab Potential Fair    PT Frequency 2x / week    PT Duration 6 weeks    PT Treatment/Interventions ADLs/Self Care Home Management;Aquatic Therapy;Cryotherapy;Electrical Stimulation;Iontophoresis 71m/ml Dexamethasone;Moist Heat;Traction;Ultrasound;DME Instruction;Gait training;Stair training;Functional mobility training;Therapeutic activities;Therapeutic  exercise;Balance training;Neuromuscular re-education;Patient/family education;Orthotic Fit/Training;Manual techniques;Passive range of motion;Scar mobilization;Dry needling;Energy conservation;Splinting;Taping    PT Next Visit Plan f/u with lumbar extension. test glutes,  lumbar mobility, core and hip strength, functional strength    PT Home Exercise Plan repeated lumbar extension, STS    Consulted and Agree with Plan of Care Patient             Patient will benefit from skilled therapeutic intervention in order to improve the following deficits and impairments:  Abnormal gait, Difficulty walking, Decreased range of motion, Decreased endurance, Decreased activity tolerance, Pain, Decreased balance, Impaired flexibility, Improper body mechanics, Decreased mobility, Decreased strength, Postural dysfunction  Visit Diagnosis: Low back pain, unspecified back pain laterality, unspecified chronicity, unspecified whether sciatica present  Muscle weakness (generalized)  Other abnormalities of gait and mobility  Other symptoms and signs involving the musculoskeletal system     Problem List Patient Active Problem List   Diagnosis Date Noted   Chronic hip pain, left 06/26/2020   [place] disease [place] 05/29[place]   S/P total knee replacement 12/21/2017   Pain in right knee 07/14/2017   Back pain 04/30/2015   Backache 04/30/2015   Seasonal allergies 02/21/2015   Osteopenia 06/20/2013   Hypothyroidism following radioiodine therapy 12/28/2010   Pure hypercholesterolemia 09/25/2010   Gastroesophageal reflux disease 09/22/2010   Osteoarthritis 04/14/2007   Disorder of thyroid 03/31/2007   Combined fat and carbohydrate induced hyperlipemia 03/31/2007   Obesity 03/31/2007   Essential (primary) hypertension 03/31/2007    9:58 AM, 09/18/20 AMearl LatinPT, DPT [facility] [place] [address]Phone: [phone]  Fax:  [phone] Name: JShealyn SeanMRN: [id number] of Birth: 709/06/1945

## 2020-10-02 ENCOUNTER — Encounter (HOSPITAL_COMMUNITY): Payer: Self-pay | Admitting: Physical Therapy

## 2020-10-02 ENCOUNTER — Other Ambulatory Visit: Payer: Self-pay

## 2020-10-02 ENCOUNTER — Ambulatory Visit (HOSPITAL_COMMUNITY): Payer: Medicare Other | Attending: Neurosurgery | Admitting: Physical Therapy

## 2020-10-02 DIAGNOSIS — R2689 Other abnormalities of gait and mobility: Secondary | ICD-10-CM | POA: Diagnosis not present

## 2020-10-02 DIAGNOSIS — R29898 Other symptoms and signs involving the musculoskeletal system: Secondary | ICD-10-CM | POA: Diagnosis not present

## 2020-10-02 DIAGNOSIS — M6281 Muscle weakness (generalized): Secondary | ICD-10-CM | POA: Diagnosis not present

## 2020-10-02 DIAGNOSIS — M545 Low back pain, unspecified: Secondary | ICD-10-CM | POA: Insufficient documentation

## 2020-10-02 NOTE — Therapy (Signed)
[facility] [address] [place], [place], [address] Phone: [phone]   Fax:  [phone]  Physical Therapy Treatment  Patient Details  Name: Stacey Hanson MRN: [id number] Date of Birth: [birth year] Referring Provider (PT): Fenton Malling NP   Encounter Date: 10/02/2020   PT End of Session - 10/02/20 1037     Visit Number 2    Number of Visits 12    Date for PT Re-Evaluation 10/30/20    Authorization Type [employer] Medicare (no auth, No VL)    Progress Note Due on Visit 10    PT Start Time 1040    PT Stop Time 1120    PT Time Calculation (min) 40 min    Activity Tolerance Patient tolerated treatment well    Behavior During Therapy [facility] for tasks assessed/performed             Past Medical History:  Diagnosis Date   BMI 30.0-30.9,adult 2012 215 LBS   2004 198 LBS   Diabetes mellitus    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    Knee pain    Obesity     Past Surgical History:  Procedure Laterality Date   ABDOMINAL HYSTERECTOMY  1970   CATARACT EXTRACTION W/PHACO Left 03/09/2019   Procedure: CATARACT EXTRACTION PHACO AND INTRAOCULAR LENS PLACEMENT ([place]);  Surgeon: Baruch Goldmann, MD;  Location: [facility];  Service: Ophthalmology;  Laterality: Left;  CDE: 9.08   CATARACT EXTRACTION W/PHACO Right 03/27/2019   Procedure: CATARACT EXTRACTION PHACO AND INTRAOCULAR LENS PLACEMENT RIGHT EYE;  Surgeon: Baruch Goldmann, MD;  Location: [facility];  Service: Ophthalmology;  Laterality: Right;  CDE: 7.47   CHOLECYSTECTOMY  OCT 2010 BZ BILIARY DYSKINESIA   CHRONIC CHOLECYSTITIS   COLONOSCOPY  MAY 2009 SCREENING   pTC TICS, [facility]   COLONOSCOPY N/A 08/11/2017   Procedure: COLONOSCOPY;  Surgeon: Danie Binder, MD;  Location: [facility];  Service:  Endoscopy;  Laterality: N/A;  9:30am   ESOPHAGOGASTRODUODENOSCOPY  10/03/2010   Procedure: ESOPHAGOGASTRODUODENOSCOPY (EGD);  Surgeon: Dorothyann Peng, MD;  Location: [facility];  Service: Endoscopy;  Laterality: N/A;   ESOPHAGOGASTRODUODENOSCOPY  12/19/2002   RMR: Normal esophagus/Normal stomach/ Duodenum large bulbar diverticulum, 1 cm bulbar ulcer with surrounding/ inflammation as described above. Normal D2   POLYPECTOMY  08/11/2017   Procedure: POLYPECTOMY;  Surgeon: Danie Binder, MD;  Location: [facility];  Service: Endoscopy;;  cecal    SAVORY DILATION  10/03/2010   Procedure: SAVORY DILATION;  Surgeon: Dorothyann Peng, MD;  Location: [facility];  Service: Endoscopy;;   TOTAL ABDOMINAL HYSTERECTOMY  1970   TOTAL KNEE ARTHROPLASTY Left 12/21/2017   Procedure: LEFT TOTAL KNEE ARTHROPLASTY;  Surgeon: Paralee Cancel, MD;  Location: [facility];  Service: Orthopedics;  Laterality: Left;  70 mins block   UPPER GASTROINTESTINAL ENDOSCOPY  OCT 2004 RMR   DUO TIC, & ULCER    There were no vitals filed for this visit.   Subjective Assessment - 10/02/20 1045     Subjective States that he couldn't do her exercises as it started to hurt in her back. Current pain level is 8/10 in her back.    Pertinent History chronic back pain, L TKA  Limitations Lifting;Standing;Walking;House hold activities    Patient Stated Goals to get better    Currently in Pain? Yes    Pain Score 7     Pain Location Back    Pain Orientation Lower    Pain Descriptors / Indicators Aching    Pain Onset More than a month ago                [facility] PT Assessment - 10/02/20 0001       Assessment   Medical Diagnosis Lumbar Radiculopathy    Referring Provider (PT) Fenton Malling NP                           [facility] Adult PT Treatment/Exercise - 10/02/20 0001       Lumbar Exercises: Prone   Other Prone Lumbar Exercises prone lying with heat 5 minutes      Modalities   Modalities Moist Heat       Moist Heat Therapy   Number Minutes Moist Heat 10 Minutes    Moist Heat Location Lumbar Spine                    PT Education - 10/02/20 1053     Education Details on centralization vs peripheralization, on heat benefits on prone lying. on compression with standing.    Person(s) Educated Patient    Methods Explanation    Comprehension Verbalized understanding              PT Short Term Goals - 09/18/20 0955       PT SHORT TERM GOAL #1   Title Patient will be independent with HEP in order to improve functional outcomes.    Time 3    Period Weeks    Status New    Target Date 10/09/20      PT SHORT TERM GOAL #2   Title Patient will report at least 25% improvement in symptoms for improved quality of life.    Time 3    Period Weeks    Status New    Target Date 10/09/20               PT Long Term Goals - 09/18/20 0956       PT LONG TERM GOAL #1   Title Patient will report at least 75% improvement in symptoms for improved quality of life.    Time 6    Period Weeks    Status New    Target Date 10/30/20      PT LONG TERM GOAL #2   Title Patient will improve FOTO score by at least 5 points in order to indicate improved tolerance to activity.    Time 6    Period Weeks    Status New    Target Date 10/30/20      PT LONG TERM GOAL #3   Title Patient will be able to complete 5x STS in under 11.4 seconds in order to reduce the risk of falls.    Time 6    Period Weeks    Status New    Target Date 10/30/20      PT LONG TERM GOAL #4   Title Patient will report centralized symptoms with LBP no greater than 4/10 for improved ability to compelte chores.    Time 6    Period Weeks    Status New    Target Date 10/30/20  Patient will benefit from skilled therapeutic intervention in order to improve the following deficits and impairments:     Visit Diagnosis: Low back pain, unspecified back pain laterality, unspecified  chronicity, unspecified whether sciatica present  Muscle weakness (generalized)  Other abnormalities of gait and mobility  Other symptoms and signs involving the musculoskeletal system     Problem List Patient Active Problem List   Diagnosis Date Noted   Chronic hip pain, left 06/26/2020   Type 2 diabetes mellitus with vascular disease ([place]) 05/29/2019   S/P total knee replacement 12/21/2017   Pain in right knee 07/14/2017   Back pain 04/30/2015   Backache 04/30/2015   Seasonal allergies 02/21/2015   Osteopenia 06/20/2013   Hypothyroidism following radioiodine therapy 12/28/2010   Pure hypercholesterolemia 09/25/2010   Gastroesophageal reflux disease 09/22/2010   Osteoarthritis 04/14/2007   Disorder of thyroid 03/31/2007   Combined fat and carbohydrate induced hyperlipemia 03/31/2007   Obesity 03/31/2007   Essential (primary) hypertension 03/31/2007    10:54 AM, 10/02/20 Jerene Pitch, DPT Physical Therapy with [facility]  [phone] office   [place], [place], [address] Phone: [phone]   Fax:  [phone]  Name: Stacey Hanson MRN: [id number] Date of Birth: [birth year]

## 2020-10-03 ENCOUNTER — Encounter (HOSPITAL_COMMUNITY): Payer: Self-pay | Admitting: Physical Therapy

## 2020-10-04 ENCOUNTER — Encounter (HOSPITAL_COMMUNITY): Payer: Medicare Other | Admitting: Physical Therapy

## 2020-10-07 ENCOUNTER — Encounter (HOSPITAL_COMMUNITY): Payer: Medicare Other | Admitting: Physical Therapy

## 2020-10-09 DIAGNOSIS — M5136 Other intervertebral disc degeneration, lumbar region: Secondary | ICD-10-CM | POA: Diagnosis not present

## 2020-10-09 DIAGNOSIS — M48061 Spinal stenosis, lumbar region without neurogenic claudication: Secondary | ICD-10-CM | POA: Diagnosis not present

## 2020-10-09 DIAGNOSIS — M5440 Lumbago with sciatica, unspecified side: Secondary | ICD-10-CM | POA: Diagnosis not present

## 2020-10-09 DIAGNOSIS — M4316 Spondylolisthesis, lumbar region: Secondary | ICD-10-CM | POA: Diagnosis not present

## 2020-10-09 DIAGNOSIS — M5416 Radiculopathy, lumbar region: Secondary | ICD-10-CM | POA: Diagnosis not present

## 2020-10-09 DIAGNOSIS — M47816 Spondylosis without myelopathy or radiculopathy, lumbar region: Secondary | ICD-10-CM | POA: Diagnosis not present

## 2020-10-11 ENCOUNTER — Encounter (HOSPITAL_COMMUNITY): Payer: Medicare Other | Admitting: Physical Therapy

## 2020-10-13 ENCOUNTER — Other Ambulatory Visit: Payer: Self-pay | Admitting: Nurse Practitioner

## 2020-10-14 ENCOUNTER — Encounter (HOSPITAL_COMMUNITY): Payer: Medicare Other | Admitting: Physical Therapy

## 2020-10-17 ENCOUNTER — Encounter (HOSPITAL_COMMUNITY): Payer: Medicare Other

## 2020-10-20 ENCOUNTER — Other Ambulatory Visit: Payer: Self-pay | Admitting: Family Medicine

## 2020-10-21 ENCOUNTER — Encounter (HOSPITAL_COMMUNITY): Payer: Medicare Other | Admitting: Physical Therapy

## 2020-10-23 ENCOUNTER — Encounter (HOSPITAL_COMMUNITY): Payer: Medicare Other | Admitting: Physical Therapy

## 2020-10-25 ENCOUNTER — Other Ambulatory Visit: Payer: Self-pay | Admitting: Family Medicine

## 2020-12-06 DIAGNOSIS — I10 Essential (primary) hypertension: Secondary | ICD-10-CM | POA: Diagnosis not present

## 2020-12-06 DIAGNOSIS — E559 Vitamin D deficiency, unspecified: Secondary | ICD-10-CM | POA: Diagnosis not present

## 2020-12-06 DIAGNOSIS — E890 Postprocedural hypothyroidism: Secondary | ICD-10-CM | POA: Diagnosis not present

## 2020-12-06 DIAGNOSIS — E1165 Type 2 diabetes mellitus with hyperglycemia: Secondary | ICD-10-CM | POA: Diagnosis not present

## 2020-12-07 LAB — COMPREHENSIVE METABOLIC PANEL
ALT: 17 IU/L (ref 0–32)
AST: 20 IU/L (ref 0–40)
Albumin/Globulin Ratio: 1.5 (ref 1.2–2.2)
Albumin: 4.6 g/dL (ref 3.7–4.7)
Alkaline Phosphatase: 141 IU/L — ABNORMAL HIGH (ref 44–121)
BUN/Creatinine Ratio: 18 (ref 12–28)
BUN: 18 mg/dL (ref 8–27)
Bilirubin Total: 0.3 mg/dL (ref 0.0–1.2)
CO2: 21 mmol/L (ref 20–29)
Calcium: 9.3 mg/dL (ref 8.7–10.3)
Chloride: 94 mmol/L — ABNORMAL LOW (ref 96–106)
Creatinine, Ser: 1.01 mg/dL — ABNORMAL HIGH (ref 0.57–1.00)
Globulin, Total: 3 g/dL (ref 1.5–4.5)
Glucose: 246 mg/dL — ABNORMAL HIGH (ref 70–99)
Potassium: 4 mmol/L (ref 3.5–5.2)
Sodium: 133 mmol/L — ABNORMAL LOW (ref 134–144)
Total Protein: 7.6 g/dL (ref 6.0–8.5)
eGFR: 58 mL/min/{1.73_m2} — ABNORMAL LOW (ref >59)

## 2020-12-07 LAB — T4, FREE: Free T4: 1.54 ng/dL (ref 0.82–1.77)

## 2020-12-07 LAB — LIPID PANEL
Chol/HDL Ratio: 3.2 ratio (ref 0.0–4.4)
Cholesterol, Total: 168 mg/dL (ref 100–199)
HDL: 53 mg/dL (ref >39)
LDL Chol Calc (NIH): 100 mg/dL — ABNORMAL HIGH (ref 0–99)
Triglycerides: 79 mg/dL (ref 0–149)
VLDL Cholesterol Cal: 15 mg/dL (ref 5–40)

## 2020-12-07 LAB — TSH: TSH: 0.328 u[IU]/mL — ABNORMAL LOW (ref 0.450–4.500)

## 2020-12-07 LAB — VITAMIN D 25 HYDROXY (VIT D DEFICIENCY, FRACTURES): Vit D, 25-Hydroxy: 37.3 ng/mL (ref 30.0–100.0)

## 2020-12-10 ENCOUNTER — Ambulatory Visit (INDEPENDENT_AMBULATORY_CARE_PROVIDER_SITE_OTHER): Payer: Medicare Other | Admitting: Family Medicine

## 2020-12-10 ENCOUNTER — Encounter (INDEPENDENT_AMBULATORY_CARE_PROVIDER_SITE_OTHER): Payer: Self-pay

## 2020-12-10 ENCOUNTER — Encounter: Payer: Self-pay | Admitting: Family Medicine

## 2020-12-10 ENCOUNTER — Other Ambulatory Visit: Payer: Self-pay

## 2020-12-10 VITALS — BP 144/74 | HR 113 | Resp 18 | Ht 67.0 in | Wt 223.1 lb

## 2020-12-10 DIAGNOSIS — E1159 Type 2 diabetes mellitus with other circulatory complications: Secondary | ICD-10-CM | POA: Diagnosis not present

## 2020-12-10 DIAGNOSIS — Z23 Encounter for immunization: Secondary | ICD-10-CM | POA: Diagnosis not present

## 2020-12-10 DIAGNOSIS — K219 Gastro-esophageal reflux disease without esophagitis: Secondary | ICD-10-CM

## 2020-12-10 DIAGNOSIS — E890 Postprocedural hypothyroidism: Secondary | ICD-10-CM

## 2020-12-10 DIAGNOSIS — I10 Essential (primary) hypertension: Secondary | ICD-10-CM | POA: Diagnosis not present

## 2020-12-10 DIAGNOSIS — E782 Mixed hyperlipidemia: Secondary | ICD-10-CM | POA: Diagnosis not present

## 2020-12-10 MED ORDER — AMLODIPINE BESYLATE 5 MG PO TABS: 5.0000 mg | ORAL_TABLET | Freq: Every day | ORAL | 3 refills | Status: DC

## 2020-12-10 NOTE — Assessment & Plan Note (Signed)
Stacey Hanson is reminded of the importance of commitment to daily physical activity for 30 minutes or more, as able and the need to limit carbohydrate intake to 30 to 60 grams per meal to help with blood sugar control.   The need to take medication as prescribed, test blood sugar as directed, and to call between visits if there is a concern that blood sugar is uncontrolled is also discussed.   Stacey Hanson is reminded of the importance of daily foot exam, annual eye examination, and good blood sugar, blood pressure and cholesterol control. Uncontrolled, managed by ndo, has upcoming appt  Diabetic Labs Latest Ref Rng & Units 12/06/2020 08/19/2020 08/12/2020 06/26/2020 04/19/2020  HbA1c 4.0 - 5.6 % - 8.3(A) - - 7.9(A)  Microalbumin mg/L - - - - 10  Micro/Creat Ratio 0 - 29 mg/g creat - - - <7 -  Chol 100 - 199 mg/dL 168 - - - -  HDL >39 mg/dL 53 - - - -  Calc LDL 0 - 99 mg/dL 100(H) - - - -  Triglycerides 0 - 149 mg/dL 79 - - - -  Creatinine 0.57 - 1.00 mg/dL 1.01(H) - 1.21(H) - -   BP/Weight 12/10/2020 08/19/2020 08/08/2020 06/26/2020 04/19/2020 03/06/2020 16/12/9602  Systolic BP 540 981 191 478 295 621 308  Diastolic BP 74 89 80 80 83 82 72  Wt. (Lbs) 223.08 223 225.4 227 225 229.12 221  BMI 34.94 34.93 35.3 35.55 35.24 35.89 34.61   Foot/eye exam completion dates Latest Ref Rng & Units 08/20/2020 03/06/2020  Eye Exam No Retinopathy No Retinopathy -  Foot exam Order - - -  Foot Form Completion - - Done

## 2020-12-10 NOTE — Patient Instructions (Signed)
ANNUAL physical exam in office with MD and re evl bP in 4 to 5 weeks, call if you need me sooner  Please reduce fried and fatty foods, bad cholesterol is slightly high  Please get TdAp and shingrix vaccines  Condolence on your recent losses  New higher dose of amlodipine , start tomorrow, 5 mg one tablet once daily. Stop amlodipine 2.5 mg  tablet , Blood pressure is still to high  It is important that you exercise regularly at least 30 minutes 5 times a week. If you develop chest pain, have severe difficulty breathing, or feel very tired, stop exercising immediately and seek medical attention    Thanks for choosing [facility], we consider it a privelige to serve you.

## 2020-12-10 NOTE — Assessment & Plan Note (Signed)
Uncontrolled, increase amlodipine to 5 mg  DASH diet and commitment to daily physical activity for a minimum of 30 minutes discussed and encouraged, as a part of hypertension management. The importance of attaining a healthy weight is also discussed.  BP/Weight 12/10/2020 08/19/2020 08/08/2020 06/26/2020 04/19/2020 03/06/2020 94/37/0052  Systolic BP 591 028 902 284 069 861 483  Diastolic BP 74 89 80 80 83 82 72  Wt. (Lbs) 223.08 223 225.4 227 225 229.12 221  BMI 34.94 34.93 35.3 35.55 35.24 35.89 34.61

## 2020-12-10 NOTE — Progress Notes (Signed)
Stacey Hanson     MRN: [id number]      DOB: [birth year]   HPI Stacey Hanson is here for follow up and re-evaluation of chronic medical conditions, medication management and review of any available recent lab and radiology data.  Preventive health is updated, specifically  Cancer screening and Immunization.   Questions or concerns regarding consultations or procedures which the PT has had in the interim are  addressed. The PT denies any adverse reactions to current medications since the last visit.  Poor sleep and increased grief following loss of close family members in quick succession, good support form family and friends Denies polyuria, polydipsia, blurred vision , or hypoglycemic episodes. States blood sugar not well controled ROS Denies recent fever or chills. Denies sinus pressure, nasal congestion, ear pain or sore throat. Denies chest congestion, productive cough or wheezing. Denies chest pains, palpitations and leg swelling Denies abdominal pain, nausea, vomiting,diarrhea or constipation.   Denies dysuria, frequency, hesitancy or incontinence. Chronic  joint pain, swelling and limitation in mobility. Denies headaches, seizures, numbness, or tingling. . Denies skin break down or rash.   PE  BP (!) 144/74   Pulse (!) 113   Resp 18   Ht 5\' 7"  (1.702 m)   Wt 223 lb 1.3 oz (101.2 kg)   SpO2 97%   BMI 34.94 kg/m   Patient alert and oriented and in no cardiopulmonary distress.  HEENT: No facial asymmetry, EOMI,     Neck supple .  Chest: Clear to auscultation bilaterally.  CVS: S1, S2 no murmurs, no S3.Regular rate.  ABD: Soft non tender.   Ext: No edema  MS: Adequate ROM spine, shoulders, hips and knees.  Skin: Intact, no ulcerations or rash noted.  Psych: Good eye contact, normal affect. Memory intact not anxious or depressed appearing.  CNS: CN 2-12 intact, power,  normal throughout.no focal deficits noted.   Assessment & Plan  Essential (primary)  hypertension Uncontrolled, increase amlodipine to 5 mg  DASH diet and commitment to daily physical activity for a minimum of 30 minutes discussed and encouraged, as a part of hypertension management. The importance of attaining a healthy weight is also discussed.  BP/Weight 12/10/2020 08/19/2020 08/08/2020 06/26/2020 04/19/2020 03/06/2020 26/33/3545  Systolic BP 625 638 937 342 876 811 572  Diastolic BP 74 89 80 80 83 82 72  Wt. (Lbs) 223.08 223 225.4 227 225 229.12 221  BMI 34.94 34.93 35.3 35.55 35.24 35.89 34.61       Type 2 diabetes mellitus with vascular disease ([place]) Stacey Hanson is reminded of the importance of commitment to daily physical activity for 30 minutes or more, as able and the need to limit carbohydrate intake to 30 to 60 grams per meal to help with blood sugar control.   The need to take medication as prescribed, test blood sugar as directed, and to call between visits if there is a concern that blood sugar is uncontrolled is also discussed.   Stacey Hanson is reminded of the importance of daily foot exam, annual eye examination, and good blood sugar, blood pressure and cholesterol control. Uncontrolled, managed by ndo, has upcoming appt  Diabetic Labs Latest Ref Rng & Units 12/06/2020 08/19/2020 08/12/2020 06/26/2020 04/19/2020  HbA1c 4.0 - 5.6 % - 8.3(A) - - 7.9(A)  Microalbumin mg/L - - - - 10  Micro/Creat Ratio 0 - 29 mg/g creat - - - <7 -  Chol 100 - 199 mg/dL 168 - - - -  HDL >39 mg/dL  53 - - - -  Calc LDL 0 - 99 mg/dL 100(H) - - - -  Triglycerides 0 - 149 mg/dL 79 - - - -  Creatinine 0.57 - 1.00 mg/dL 1.01(H) - 1.21(H) - -   BP/Weight 12/10/2020 08/19/2020 08/08/2020 06/26/2020 04/19/2020 03/06/2020 28/24/1753  Systolic BP 010 404 591 368 599 234 144  Diastolic BP 74 89 80 80 83 82 72  Wt. (Lbs) 223.08 223 225.4 227 225 229.12 221  BMI 34.94 34.93 35.3 35.55 35.24 35.89 34.61   Foot/eye exam completion dates Latest Ref Rng & Units 08/20/2020 03/06/2020  Eye Exam No  Retinopathy No Retinopathy -  Foot exam Order - - -  Foot Form Completion - - Done        Combined fat and carbohydrate induced hyperlipemia Hyperlipidemia:Low fat diet discussed and encouraged.   Lipid Panel  Lab Results  Component Value Date   CHOL 168 12/06/2020   HDL 53 12/06/2020   LDLCALC 100 (H) 12/06/2020   TRIG 79 12/06/2020   CHOLHDL 3.2 12/06/2020     Needs to redue fat  Hypothyroidism following radioiodine therapy Managed by Endo, recnt TSH outside of range  Gastroesophageal reflux disease Controlled, no change in medication

## 2020-12-10 NOTE — Assessment & Plan Note (Signed)
Managed by Endo, recnt TSH outside of range

## 2020-12-10 NOTE — Assessment & Plan Note (Signed)
Controlled, no change in medication  

## 2020-12-10 NOTE — Assessment & Plan Note (Signed)
Hyperlipidemia:Low fat diet discussed and encouraged.   Lipid Panel  Lab Results  Component Value Date   CHOL 168 12/06/2020   HDL 53 12/06/2020   LDLCALC 100 (H) 12/06/2020   TRIG 79 12/06/2020   CHOLHDL 3.2 12/06/2020     Needs to redue fat

## 2020-12-18 ENCOUNTER — Other Ambulatory Visit: Payer: Self-pay | Admitting: Family Medicine

## 2020-12-19 ENCOUNTER — Encounter: Payer: Self-pay | Admitting: Nurse Practitioner

## 2020-12-19 ENCOUNTER — Other Ambulatory Visit: Payer: Self-pay

## 2020-12-19 ENCOUNTER — Encounter: Payer: Medicare Other | Admitting: Nurse Practitioner

## 2020-12-19 ENCOUNTER — Ambulatory Visit: Payer: Medicare Other | Admitting: Nurse Practitioner

## 2020-12-19 VITALS — Ht 67.0 in

## 2020-12-19 VITALS — BP 101/61 | HR 101 | Ht 67.0 in | Wt 220.0 lb

## 2020-12-19 DIAGNOSIS — E1165 Type 2 diabetes mellitus with hyperglycemia: Secondary | ICD-10-CM

## 2020-12-19 LAB — POCT GLYCOSYLATED HEMOGLOBIN (HGB A1C): HbA1c, POC (controlled diabetic range): 9.7 % — AB (ref 0.0–7.0)

## 2020-12-19 MED ORDER — INSULIN LISPRO PROT & LISPRO (75-25 MIX) 100 UNIT/ML KWIKPEN: 50.0000 [IU] | PEN_INJECTOR | Freq: Two times a day (BID) | SUBCUTANEOUS | 3 refills | Status: DC

## 2020-12-19 NOTE — Patient Instructions (Signed)

## 2020-12-19 NOTE — Progress Notes (Signed)
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    Knee pain    Obesity    Past Surgical History:  Procedure Laterality Date   ABDOMINAL HYSTERECTOMY  1970   CATARACT EXTRACTION W/PHACO Left 03/09/2019   Procedure: CATARACT EXTRACTION PHACO AND INTRAOCULAR LENS PLACEMENT ([place]);  Surgeon: Baruch Goldmann, MD;  Location: [facility];  Service: Ophthalmology;  Laterality: Left;  CDE: 9.08   CATARACT EXTRACTION W/PHACO Right 03/27/2019   Procedure: CATARACT EXTRACTION PHACO AND INTRAOCULAR LENS PLACEMENT RIGHT EYE;  Surgeon: Baruch Goldmann, MD;  Location: [facility];  Service: Ophthalmology;  Laterality: Right;  CDE: 7.47   CHOLECYSTECTOMY  OCT 2010 BZ BILIARY DYSKINESIA   CHRONIC CHOLECYSTITIS   COLONOSCOPY  MAY 2009 SCREENING   [facility]TC TICS, [facility]   COLONOSCOPY N/A 08/11/2017   Procedure: COLONOSCOPY;  Surgeon: Danie Binder, MD;  Location: [facility];  Service: Endoscopy;  Laterality: N/A;  9:30am   ESOPHAGOGASTRODUODENOSCOPY  10/03/2010   Procedure: ESOPHAGOGASTRODUODENOSCOPY (EGD);  Surgeon: Dorothyann Peng, MD;  Location: [facility];  Service: Endoscopy;  Laterality: N/A;   ESOPHAGOGASTRODUODENOSCOPY  12/19/2002   RMR: Normal esophagus/Normal stomach/ Duodenum large bulbar diverticulum, 1 cm bulbar ulcer with surrounding/ inflammation as described above. Normal D2   POLYPECTOMY  08/11/2017   Procedure: POLYPECTOMY;  Surgeon: Danie Binder, MD;  Location: [facility];  Service: Endoscopy;;  cecal    SAVORY DILATION  10/03/2010   Procedure:  SAVORY DILATION;  Surgeon: Dorothyann Peng, MD;  Location: [facility];  Service: Endoscopy;;   TOTAL ABDOMINAL HYSTERECTOMY  1970   TOTAL KNEE ARTHROPLASTY Left 12/21/2017   Procedure: LEFT TOTAL KNEE ARTHROPLASTY;  Surgeon: Paralee Cancel, MD;  Location: [facility];  Service: Orthopedics;  Laterality: Left;  70 mins block   UPPER GASTROINTESTINAL ENDOSCOPY  OCT 2004 RMR   DUO TIC, & ULCER   Social History   Socioeconomic History   Marital status: Single    Spouse name: Not on file   Number of children: 3   Years of education: 10   Highest education level: 10th grade  Occupational History   Occupation: [employer]OR   Occupation: Retired   Tobacco Use   Smoking status: Former    Packs/day: 0.25    Years: 10.00    Pack years: 2.50    Types: Cigarettes    Quit date: 02/20/1985    Years since quitting: 35.8   Smokeless tobacco: Never  Vaping Use   Vaping Use: Never used  Substance and Sexual Activity   Alcohol use: No   Drug use: No   Sexual activity: Not Currently  Other Topics Concern   Not on file  Social History Narrative   Lives alone    Social Determinants of Health   Financial Resource Strain: Low Risk    Difficulty of Paying Living Expenses: Not hard at all  Food Insecurity: No Food Insecurity   Worried About Charity fundraiser  in the Last Year: Never true   [place] in the Last Year: Never true  Transportation Needs: No Transportation Needs   Lack of Transportation (Medical): No   Lack of Transportation (Non-Medical): No  Physical Activity: Insufficiently Active   Days of Exercise per Week: 2 days   Minutes of Exercise per Session: 10 min  Stress: No Stress Concern Present   Feeling of Stress : Not at all  Social Connections: Socially Isolated   Frequency of Communication with Friends and Family: More than three times a week   Frequency of Social Gatherings with Friends and Family: More than three times a week   Attends Religious Services: Never   Building surveyor or Organizations: No   Attends Archivist Meetings: Never   Marital Status: Never married   Outpatient Encounter Medications as of 12/19/2020  Medication Sig   amLODipine (NORVASC) 5 MG tablet Take 1 tablet (5 mg total) by mouth daily.   aspirin EC 81 MG tablet Take 81 mg by mouth daily.   B-D UF III MINI PEN NEEDLES 31G X 5 MM MISC USE FOR TWICE DAILY INJECTIONS OF HUMALOG   B-D ULTRAFINE III SHORT PEN 31G X 8 MM MISC USE AS DIRECTED TO INJECT INSULIN TWICE DAILY   benazepril (LOTENSIN) 40 MG tablet TAKE 1 TABLET(40 MG) BY MOUTH DAILY   Blood Glucose Monitoring Suppl (ONE TOUCH ULTRA 2) w/Device KIT Twice daily testing dx e11.65   Calcium Carb-Cholecalciferol (CALCIUM 600/VITAMIN D3 PO) Take 2 tablets by mouth daily.   glipiZIDE (GLUCOTROL XL) 5 MG 24 hr tablet TAKE 1 TABLET(5 MG) BY MOUTH DAILY WITH BREAKFAST   glucose blood (ONETOUCH ULTRA) test strip USE 1 STRIP TO CHECK GLUCOSE TWICE DAILY   hydrochlorothiazide (HYDRODIURIL) 25 MG tablet TAKE 1 TABLET(25 MG) BY MOUTH DAILY   Lancets (ONETOUCH DELICA PLUS [id number]) MISC USE 1 LANCET  TO CHECK GLUCOSE TWICE DAILY AS DIRECTED   levothyroxine (SYNTHROID) 88 MCG tablet TAKE 1 TABLET(88 MCG) BY MOUTH DAILY BEFORE BREAKFAST   metFORMIN (GLUCOPHAGE) 500 MG tablet TAKE 1 TABLET(500 MG) BY MOUTH  DAILY WITH A MEAL   Multiple Vitamin (MULTIVITAMIN) capsule Take 1 capsule by mouth daily.   omeprazole (PRILOSEC) 20 MG capsule TAKE 1 CAPSULE BY MOUTH EVERY MORNING   polyethylene glycol (MIRALAX / GLYCOLAX) 17 g packet Take 17 g by mouth as needed.   pravastatin (PRAVACHOL) 40 MG tablet TAKE 1 TABLET(40 MG) BY MOUTH DAILY   psyllium (METAMUCIL) 58.6 % packet Take 1 packet by mouth as needed.   UNABLE TO FIND Diabetic shoes x 1 pair, Inserts x 3 pair DX E11.9   [DISCONTINUED] HUMALOG MIX 75/25 KWIKPEN (75-25) 100 UNIT/ML Kwikpen INJECT 40 UNITS UNDER THE SKIN TWICE DAILY   Insulin Lispro Prot & Lispro (HUMALOG MIX 75/25  KWIKPEN) (75-25) 100 UNIT/ML Kwikpen Inject 50 Units into the skin 2 (two) times daily with a meal.   No facility-administered encounter medications on file as of 12/19/2020.   ALLERGIES: Allergies  Allergen Reactions   Simvastatin Other (See Comments)    Muscle cramps   VACCINATION STATUS: Immunization History  Administered Date(s) Administered   Fluad Quad(high Dose 65+) 11/23/2018, 12/21/2019, 12/10/2020   Influenza,inj,Quad PF,6+ Mos 11/16/2012, 11/15/2013, 11/14/2014, 11/14/2015, 11/03/2016, 11/09/2017   Influenza,inj,quad, With Preservative 11/02/2016   Moderna SARS-COV2 Booster Vaccination 07/24/2020   Moderna Sars-Covid-2 Vaccination 04/27/2019, 05/26/2019, 01/08/2020   Pneumococcal Conjugate-13 10/11/2013   Pneumococcal Polysaccharide-23 10/07/2011   Zoster, Live 04/05/2006  Hypertension This is a chronic problem. The current episode started more than 1 year ago. The problem has been gradually improving since onset. The problem is controlled. Pertinent negatives include no blurred vision, headaches, palpitations, shortness of breath or sweats. Agents associated with hypertension include thyroid hormones. Risk factors for coronary artery disease include diabetes mellitus, dyslipidemia, obesity, sedentary lifestyle and smoking/tobacco exposure. Past treatments include ACE inhibitors, diuretics and calcium channel blockers. The current treatment provides mild improvement. There are no compliance problems.  Hypertensive end-organ damage includes kidney disease. Identifiable causes of hypertension include chronic renal disease and a thyroid problem.  Diabetes She presents for her follow-up diabetic visit. She has type 2 diabetes mellitus. Onset time: She was diagnosed at approximate age of 73 years. Her disease course has been worsening. Pertinent negatives for hypoglycemia include no confusion, headaches, nervousness/anxiousness, sweats or tremors. Pertinent negatives for diabetes  include no blurred vision, no fatigue, no foot paresthesias, no polydipsia, no polyphagia, no polyuria and no weight loss. There are no hypoglycemic complications. Symptoms are stable. Diabetic complications include nephropathy. Risk factors for coronary artery disease include dyslipidemia, diabetes mellitus, obesity, hypertension, sedentary lifestyle, post-menopausal and tobacco exposure. Current diabetic treatment includes insulin injections and oral agent (dual therapy). She is compliant with treatment most of the time. Her weight is fluctuating minimally. She is following a generally unhealthy diet. When asked about meal planning, she reported none. She has not had a previous visit with a dietitian. She rarely participates in exercise. Her home blood glucose trend is increasing steadily. Her breakfast blood glucose range is generally 180-200 mg/dl. Her bedtime blood glucose range is generally >200 mg/dl. (She presents today with her meter and logs showing above target fasting and postprandial glycemic profile.  Her POCT A1c today is 9.7%, increasing from last visit of 8.3%.  She reports multiple deaths in the family since last visit and also says she got a steroid injection in her spine sometime in late August, early September.  She denies any hypoglycemia.) An ACE inhibitor/angiotensin II receptor blocker is being taken. She does not see a podiatrist.Eye exam is current.  Thyroid Problem Presents for follow-up (-She is also status post radioactive iodine treatment for toxic nodule on October 17, 2010.  She has had  history of multinodular goiter status post fine-needle aspiration with benign findings.) visit. Patient reports no anxiety, cold intolerance, constipation, depressed mood, diarrhea, fatigue, heat intolerance, leg swelling, palpitations, tremors, weight gain or weight loss. The symptoms have been stable.    Review of systems  Constitutional: + Minimally fluctuating body weight,  current Body  mass index is 34.46 kg/m. , no fatigue, no subjective hyperthermia, no subjective hypothermia Eyes: no blurry vision, no xerophthalmia ENT: no sore throat, no nodules palpated in throat, no dysphagia/odynophagia, no hoarseness Cardiovascular: no chest pain, no shortness of breath, no palpitations, no leg swelling Respiratory: no cough, no shortness of breath Gastrointestinal: no nausea/vomiting/diarrhea Musculoskeletal: no muscle/joint aches Skin: no rashes, no hyperemia Neurological: no tremors, no numbness, no tingling, no dizziness Psychiatric: no depression, no anxiety    Objective:    BP 101/61   Pulse (!) 101   Ht _0  (1.702 m)   Wt 220 lb (99.8 kg)   BMI 34.46 kg/m   Wt Readings from Last 3 Encounters:  12/19/20 220 lb (99.8 kg)  12/10/20 223 lb 1.3 oz (101.2 kg)  08/19/20 223 lb (101.2 kg)    BP Readings from Last 3 Encounters:  12/19/20 101/61  12/10/20 (!) 144/74  08/19/20 (!) 150/89     Physical Exam- Limited  Constitutional:  Body mass index is 34.46 kg/m. , not in acute distress, normal state of mind Eyes:  EOMI, no exophthalmos Neck: Supple Cardiovascular: RRR, no murmurs, rubs, or gallops, no edema Respiratory: Adequate breathing efforts, no crackles, rales, rhonchi, or wheezing Musculoskeletal: no gross deformities, strength intact in all four extremities, no gross restriction of joint movements Skin:  no rashes, no hyperemia Neurological: no tremor with outstretched hands     CMP     Component Value Date/Time   NA 133 (L) 12/06/2020 0956   K 4.0 12/06/2020 0956   CL 94 (L) 12/06/2020 0956   CO2 21 12/06/2020 0956   GLUCOSE 246 (H) 12/06/2020 0956   GLUCOSE 412 (H) 03/07/2019 0940   BUN 18 12/06/2020 0956   CREATININE 1.01 (H) 12/06/2020 0956   CREATININE 0.96 (H) 03/12/2016 0925   CALCIUM 9.3 12/06/2020 0956   PROT 7.6 12/06/2020 0956   ALBUMIN 4.6 12/06/2020 0956   AST 20 12/06/2020 0956   ALT 17 12/06/2020 0956   ALKPHOS 141 (H)  12/06/2020 0956   BILITOT 0.3 12/06/2020 0956   GFRNONAA 41 (L) 04/11/2020 1007   GFRNONAA 60 03/12/2016 0925   GFRAA 48 (L) 04/11/2020 1007   GFRAA 69 03/12/2016 0925     Diabetic Labs (most recent): Lab Results  Component Value Date   HGBA1C 9.7 (A) 12/19/2020   HGBA1C 8.3 (A) 08/19/2020   HGBA1C 7.9 (A) 04/19/2020     Lipid Panel ( most recent) Lipid Panel     Component Value Date/Time   CHOL 168 12/06/2020 0957   TRIG 79 12/06/2020 0957   HDL 53 12/06/2020 0957   CHOLHDL 3.2 12/06/2020 0957   CHOLHDL 2.2 01/11/2016 0916   VLDL 11 01/11/2016 0916   LDLCALC 100 (H) 12/06/2020 0957    Assessment & Plan:   1) Uncontrolled type 2 diabetes mellitus with stage 3 chronic kidney disease, with long-term current use of insulin (HCC)  -She will continue to need insulin treatment in order for her to achieve control of diabetes to target.    She presents today with her meter and logs showing above target fasting and postprandial glycemic profile.  Her POCT A1c today is 9.7%, increasing from last visit of 8.3%.  She reports multiple deaths in the family since last visit and also says she got a steroid injection in her spine sometime in late August, early September.  She denies any hypoglycemia.  She admits to still drinking sugary beverages such as diet green tea.  Analysis of her meter shows 7-day average of 250, 14-day average of 250, and 30-day average of 234.  - Nutritional counseling repeated at each appointment due to patients tendency to fall back in to old habits.  - The patient admits there is a room for improvement in their diet and drink choices. -  Suggestion is made for the patient to avoid simple carbohydrates from their diet including Cakes, Sweet Desserts / Pastries, Ice Cream, Soda (diet and regular), Sweet Tea, Candies, Chips, Cookies, Sweet Pastries, Store Bought Juices, Alcohol in Excess of 1-2 drinks a day, Artificial Sweeteners, Coffee Creamer, and "Sugar-free"  Products. This will help patient to have stable blood glucose profile and potentially avoid unintended weight gain.   - I encouraged the patient to switch to unprocessed or minimally processed complex starch and increased protein intake (animal or plant source), fruits, and vegetables.   - Patient is advised to stick  to a routine mealtimes to eat 3 meals a day and avoid unnecessary snacks (to snack only to correct hypoglycemia).  -Based on her above target glucose readings, she is advised to increase her Humalog 75/25 to 50 units with breakfast and supper if glucose is above 90 and she is eating.  She can also continue her Metformin 500 mg po once daily with breakfast (renal function improved slightly) and Glipizide 5 mg XL daily with breakfast.    -She is advised to continue monitoring blood glucose at least 3 times daily, before injecting insulin at breakfast and supper, and before bed.  She is to contact the clinic if she has readings less than 70 or greater than 200 for 3 tests in a row.  2) Hypothyroidism- s/p RAI  -Her previsit thyroid function tests are consistent with appropriate hormone replacement, borderline too much, but acceptable for the colder season.  She is advised to continue Levothyroxine 88 mcg po daily before breakfast.    - We discussed about the correct intake of her thyroid hormone, on empty stomach at fasting, with water, separated by at least 30 minutes from breakfast and other medications,  and separated by more than 4 hours from calcium, iron, multivitamins, acid reflux medications (PPIs). -Patient is made aware of the fact that thyroid hormone replacement is needed for life, dose to be adjusted by periodic monitoring of thyroid function tests.  3) Weight management:  Her Body mass index is 34.46 kg/m.-a candidate for modest weight loss.  Patient specific carbs restrictions and exercise regimen discussed with her.  4) Blood pressure/ Hypertension: -Her blood pressure  is controlled to target.  She is advised to continue Benazepril 40 mg po daily, Norvasc 5 mg po daily, and HCTZ 25 mg po daily.    5) Hyperlipidemia: Her recent lipid panel from 12/06/20 showed uncontrolled LDL of 100.  She is advised to continue Pravastatin 40 mg po daily at bedtime.  Side effects and precautions discussed with her.  - I advised patient to maintain close follow up with Fayrene Helper, MD for diabetes and all other primary care needs.     I spent 30 minutes in the care of the patient today including review of labs from [place], Lipids, Thyroid Function, Hematology (current and previous including abstractions from other facilities); face-to-face time discussing  her blood glucose readings/logs, discussing hypoglycemia and hyperglycemia episodes and symptoms, medications doses, her options of short and long term treatment based on the latest standards of care / guidelines;  discussion about incorporating lifestyle medicine;  and documenting the encounter.    Please refer to Patient Instructions for Blood Glucose Monitoring and Insulin/Medications Dosing Guide"  in media tab for additional information. Please  also refer to " Patient Self Inventory" in the Media  tab for reviewed elements of pertinent patient history.  Stacey Hanson participated in the discussions, expressed understanding, and voiced agreement with the above plans.  All questions were answered to her satisfaction. she is encouraged to contact clinic should she have any questions or concerns prior to her return visit.    Follow up plan: Return in about 3 months (around 03/21/2021) for Diabetes F/U- A1c and UM in office, No previsit labs, Bring meter and logs.   Rayetta Pigg, [facility] [address] [place], [place] [address] Phone: [phone] Fax: [phone]  12/19/2020, 1:27 PM

## 2020-12-28 ENCOUNTER — Emergency Department (HOSPITAL_COMMUNITY)
Admission: EM | Admit: 2020-12-28 | Discharge: 2020-12-29 | Disposition: A | Discharge status: Home / Self Care | Payer: Medicare Other | Attending: Emergency Medicine | Admitting: Emergency Medicine

## 2020-12-28 ENCOUNTER — Emergency Department (HOSPITAL_COMMUNITY): Payer: Medicare Other

## 2020-12-28 ENCOUNTER — Encounter (HOSPITAL_COMMUNITY): Payer: Self-pay

## 2020-12-28 ENCOUNTER — Other Ambulatory Visit: Payer: Self-pay

## 2020-12-28 DIAGNOSIS — S8391XA Sprain of unspecified site of right knee, initial encounter: Secondary | ICD-10-CM | POA: Insufficient documentation

## 2020-12-28 DIAGNOSIS — Z87891 Personal history of nicotine dependence: Secondary | ICD-10-CM | POA: Insufficient documentation

## 2020-12-28 DIAGNOSIS — W19XXXA Unspecified fall, initial encounter: Secondary | ICD-10-CM | POA: Insufficient documentation

## 2020-12-28 DIAGNOSIS — Z794 Long term (current) use of insulin: Secondary | ICD-10-CM | POA: Diagnosis not present

## 2020-12-28 DIAGNOSIS — M79672 Pain in left foot: Secondary | ICD-10-CM | POA: Diagnosis not present

## 2020-12-28 DIAGNOSIS — R9431 Abnormal electrocardiogram [ECG] [EKG]: Secondary | ICD-10-CM | POA: Diagnosis not present

## 2020-12-28 DIAGNOSIS — S43402A Unspecified sprain of left shoulder joint, initial encounter: Secondary | ICD-10-CM | POA: Insufficient documentation

## 2020-12-28 DIAGNOSIS — Z7982 Long term (current) use of aspirin: Secondary | ICD-10-CM | POA: Diagnosis not present

## 2020-12-28 DIAGNOSIS — Y92009 Unspecified place in unspecified non-institutional (private) residence as the place of occurrence of the external cause: Secondary | ICD-10-CM | POA: Insufficient documentation

## 2020-12-28 DIAGNOSIS — E1165 Type 2 diabetes mellitus with hyperglycemia: Secondary | ICD-10-CM | POA: Diagnosis not present

## 2020-12-28 DIAGNOSIS — M7989 Other specified soft tissue disorders: Secondary | ICD-10-CM | POA: Diagnosis not present

## 2020-12-28 DIAGNOSIS — I10 Essential (primary) hypertension: Secondary | ICD-10-CM | POA: Insufficient documentation

## 2020-12-28 DIAGNOSIS — M25561 Pain in right knee: Secondary | ICD-10-CM | POA: Diagnosis not present

## 2020-12-28 DIAGNOSIS — Z7984 Long term (current) use of oral hypoglycemic drugs: Secondary | ICD-10-CM | POA: Diagnosis not present

## 2020-12-28 DIAGNOSIS — S92412A Displaced fracture of proximal phalanx of left great toe, initial encounter for closed fracture: Secondary | ICD-10-CM | POA: Insufficient documentation

## 2020-12-28 DIAGNOSIS — Z79899 Other long term (current) drug therapy: Secondary | ICD-10-CM | POA: Insufficient documentation

## 2020-12-28 DIAGNOSIS — Z96652 Presence of left artificial knee joint: Secondary | ICD-10-CM | POA: Diagnosis not present

## 2020-12-28 DIAGNOSIS — Y9301 Activity, walking, marching and hiking: Secondary | ICD-10-CM | POA: Diagnosis not present

## 2020-12-28 DIAGNOSIS — E039 Hypothyroidism, unspecified: Secondary | ICD-10-CM | POA: Diagnosis not present

## 2020-12-28 DIAGNOSIS — E860 Dehydration: Secondary | ICD-10-CM | POA: Insufficient documentation

## 2020-12-28 DIAGNOSIS — R42 Dizziness and giddiness: Secondary | ICD-10-CM | POA: Insufficient documentation

## 2020-12-28 DIAGNOSIS — M19012 Primary osteoarthritis, left shoulder: Secondary | ICD-10-CM | POA: Diagnosis not present

## 2020-12-28 DIAGNOSIS — S99922A Unspecified injury of left foot, initial encounter: Secondary | ICD-10-CM | POA: Diagnosis present

## 2020-12-28 DIAGNOSIS — R739 Hyperglycemia, unspecified: Secondary | ICD-10-CM

## 2020-12-28 LAB — COMPREHENSIVE METABOLIC PANEL
ALT: 18 U/L (ref 0–44)
AST: 23 U/L (ref 15–41)
Albumin: 4 g/dL (ref 3.5–5.0)
Alkaline Phosphatase: 99 U/L (ref 38–126)
Anion gap: 12 (ref 5–15)
BUN: 18 mg/dL (ref 8–23)
CO2: 24 mmol/L (ref 22–32)
Calcium: 8.8 mg/dL — ABNORMAL LOW (ref 8.9–10.3)
Chloride: 96 mmol/L — ABNORMAL LOW (ref 98–111)
Creatinine, Ser: 1.39 mg/dL — ABNORMAL HIGH (ref 0.44–1.00)
GFR, Estimated: 40 mL/min — ABNORMAL LOW (ref >60)
Glucose, Bld: 395 mg/dL — ABNORMAL HIGH (ref 70–99)
Potassium: 3.6 mmol/L (ref 3.5–5.1)
Sodium: 132 mmol/L — ABNORMAL LOW (ref 135–145)
Total Bilirubin: 0.6 mg/dL (ref 0.3–1.2)
Total Protein: 7.7 g/dL (ref 6.5–8.1)

## 2020-12-28 LAB — CBC WITH DIFFERENTIAL/PLATELET
Abs Immature Granulocytes: 0.05 10*3/uL (ref 0.00–0.07)
Basophils Absolute: 0.1 10*3/uL (ref 0.0–0.1)
Basophils Relative: 1 %
Eosinophils Absolute: 0 10*3/uL (ref 0.0–0.5)
Eosinophils Relative: 0 %
HCT: 36.2 % (ref 36.0–46.0)
Hemoglobin: 12 g/dL (ref 12.0–15.0)
Immature Granulocytes: 1 %
Lymphocytes Relative: 14 %
Lymphs Abs: 1.4 10*3/uL (ref 0.7–4.0)
MCH: 30.8 pg (ref 26.0–34.0)
MCHC: 33.1 g/dL (ref 30.0–36.0)
MCV: 93.1 fL (ref 80.0–100.0)
Monocytes Absolute: 0.8 10*3/uL (ref 0.1–1.0)
Monocytes Relative: 8 %
Neutro Abs: 7.9 10*3/uL — ABNORMAL HIGH (ref 1.7–7.7)
Neutrophils Relative %: 76 %
Platelets: 403 10*3/uL — ABNORMAL HIGH (ref 150–400)
RBC: 3.89 MIL/uL (ref 3.87–5.11)
RDW: 12.4 % (ref 11.5–15.5)
WBC: 10.3 10*3/uL (ref 4.0–10.5)
nRBC: 0 % (ref 0.0–0.2)

## 2020-12-28 MED ORDER — HYDROCODONE-ACETAMINOPHEN 5-325 MG PO TABS
1.0000 | ORAL_TABLET | Freq: Once | ORAL | Status: AC
  Administered 2020-12-28: 1 via ORAL
  Filled 2020-12-28: qty 1

## 2020-12-28 NOTE — ED Triage Notes (Signed)
Pt arrived via POV c/o more frequent falls at home. Pt reports standing up from her bed this evening and then losing her balance and falling in between the dressers in the room. Pt denies LOC burt does endorse pain to her left shoulder left back and left foot.

## 2020-12-29 DIAGNOSIS — M7989 Other specified soft tissue disorders: Secondary | ICD-10-CM | POA: Diagnosis not present

## 2020-12-29 DIAGNOSIS — M79672 Pain in left foot: Secondary | ICD-10-CM | POA: Diagnosis not present

## 2020-12-29 DIAGNOSIS — M19012 Primary osteoarthritis, left shoulder: Secondary | ICD-10-CM | POA: Diagnosis not present

## 2020-12-29 DIAGNOSIS — M25561 Pain in right knee: Secondary | ICD-10-CM | POA: Diagnosis not present

## 2020-12-29 MED ORDER — HYDROCODONE-ACETAMINOPHEN 5-325 MG PO TABS: 1.0000 | ORAL_TABLET | Freq: Four times a day (QID) | ORAL | 0 refills | Status: DC | PRN

## 2020-12-29 MED ORDER — HYDROCODONE-ACETAMINOPHEN 5-325 MG PO TABS
1.0000 | ORAL_TABLET | Freq: Once | ORAL | Status: AC
  Administered 2020-12-29: 1 via ORAL
  Filled 2020-12-29: qty 1

## 2020-12-29 NOTE — ED Provider Notes (Signed)
[name] EMERGENCY DEPARTMENT Provider Note   CSN: [id number] Arrival date & time: 12/28/20  2119     History Chief Complaint  Patient presents with   Lytle Michaels    Stacey Hanson is a 75 y.o. female.  The history is provided by the patient and a relative.  Fall This is a new problem. The current episode started 12 to 24 hours ago. The problem has been gradually improving. Pertinent negatives include no chest pain, no abdominal pain, no headaches and no shortness of breath. The symptoms are aggravated by walking. The symptoms are relieved by rest.  Patient history of diabetes presents after a fall.  She reports this occurred a day ago.  She reports she was walking from the bed to her dresser when she lost her balance and fell.  She reports injuring her left shoulder, left foot and her right knee.  No head injury or LOC.  She had mild dizziness but that is improved.  She is not on anticoagulation.  No chest pain or shortness of breath.  No significant abdominal pain. 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    Knee pain    Obesity     Patient Active Problem List   Diagnosis Date Noted   Chronic hip pain, left 06/26/2020   Type 2 diabetes mellitus with vascular disease ([place]) 05/29/2019   S/P total knee replacement 12/21/2017   Pain in right knee 07/14/2017   Back pain 04/30/2015   Backache 04/30/2015   Seasonal allergies 02/21/2015   Osteopenia 06/20/2013   Hypothyroidism following radioiodine therapy 12/28/2010   Pure hypercholesterolemia 09/25/2010   Gastroesophageal reflux disease 09/22/2010   Osteoarthritis  04/14/2007   Disorder of thyroid 03/31/2007   Combined fat and carbohydrate induced hyperlipemia 03/31/2007   Obesity 03/31/2007   Essential (primary) hypertension 03/31/2007    Past Surgical History:  Procedure Laterality Date   ABDOMINAL HYSTERECTOMY  1970   CATARACT EXTRACTION W/PHACO Left 03/09/2019   Procedure: CATARACT EXTRACTION PHACO AND INTRAOCULAR LENS PLACEMENT ([place]);  Surgeon: Baruch Goldmann, MD;  Location: [facility];  Service: Ophthalmology;  Laterality: Left;  CDE: 9.08   CATARACT EXTRACTION W/PHACO Right 03/27/2019   Procedure: CATARACT EXTRACTION PHACO AND INTRAOCULAR LENS PLACEMENT RIGHT EYE;  Surgeon: Baruch Goldmann, MD;  Location: [facility];  Service: Ophthalmology;  Laterality: Right;  CDE: 7.47   CHOLECYSTECTOMY  OCT 2010 BZ BILIARY DYSKINESIA   CHRONIC CHOLECYSTITIS   COLONOSCOPY  MAY 2009 SCREENING   pTC TICS, [facility]   COLONOSCOPY N/A 08/11/2017   Procedure: COLONOSCOPY;  Surgeon: Danie Binder, MD;  Location: [facility] ENDO SUITE;  Service: Endoscopy;  Laterality: N/A;  9:30am   ESOPHAGOGASTRODUODENOSCOPY  10/03/2010   Procedure: ESOPHAGOGASTRODUODENOSCOPY (EGD);  Surgeon: Dorothyann Peng, MD;  Location: [facility] SU[facility];  Service: Endoscopy;  Laterality: N/A;   ESOPHAGOGASTRODUODENOSCOPY  12/19/2002   RMR: Normal esophagus/Normal stomach/ Duodenum large bulbar diverticulum, 1 cm bulbar ulcer with surrounding/ inflammation as described above. Normal D2   POLYPECTOMY  08/11/2017   Procedure: POLYPECTOMY;  Surgeon: Oneida Alar,  Marga Melnick, MD;  Location: [facility];  Service: Endoscopy;;  cecal    SAVORY DILATION  10/03/2010   Procedure: SAVORY DILATION;  Surgeon: Dorothyann Peng, MD;  Location: [facility];  Service: Endoscopy;;   TOTAL ABDOMINAL HYSTERECTOMY  1970   TOTAL KNEE ARTHROPLASTY Left 12/21/2017   Procedure: LEFT TOTAL KNEE ARTHROPLASTY;  Surgeon: Paralee Cancel, MD;  Location: [facility];  Service: Orthopedics;  Laterality: Left;  70 mins block   UPPER GASTROINTESTINAL ENDOSCOPY   OCT 2004 RMR   DUO TIC, & ULCER     OB History   No obstetric history on file.     Family History  Problem Relation Age of Onset   Diabetes Mother    Hypertension Mother    Heart failure Mother    Cancer Father 49       prostate and colon   Diabetes Sister    Hypertension Sister    Hypertension Sister    Diabetes Sister    Diabetes Brother    Hypertension Brother    Diabetes Brother    Hypertension Brother    Coronary artery disease Brother 63       MI   Colon cancer Neg Hx    Colon polyps Neg Hx     Social History   Tobacco Use   Smoking status: Former    Packs/day: 0.25    Years: 10.00    Pack years: 2.50    Types: Cigarettes    Quit date: 02/20/1985    Years since quitting: 35.8   Smokeless tobacco: Never  Vaping Use   Vaping Use: Never used  Substance Use Topics   Alcohol use: No   Drug use: No    Home Medications Prior to Admission medications   Medication Sig Start Date End Date Taking? Authorizing Provider  HYDROcodone-acetaminophen (NORCO/VICODIN) 5-325 MG tablet Take 1 tablet by mouth every 6 (six) hours as needed for severe pain. 12/29/20  Yes Ripley Fraise, MD  amLODipine (NORVASC) 5 MG tablet Take 1 tablet (5 mg total) by mouth daily. 12/10/20   Fayrene Helper, MD  aspirin EC 81 MG tablet Take 81 mg by mouth daily.    [provider]  B-D UF III MINI PEN NEEDLES 31G X 5 MM MISC USE FOR TWICE DAILY INJECTIONS OF HUMALOG 12/12/18   Fayrene Helper, MD  B-D ULTRAFINE III SHORT PEN 31G X 8 MM MISC USE AS DIRECTED TO INJECT INSULIN TWICE DAILY 12/18/20   Fayrene Helper, MD  benazepril (LOTENSIN) 40 MG tablet TAKE 1 TABLET(40 MG) BY MOUTH DAILY 04/16/20   Fayrene Helper, MD  Blood Glucose Monitoring Suppl (ONE TOUCH ULTRA 2) w/Device KIT Twice daily testing dx e11.65 12/27/18   Fayrene Helper, MD  Calcium Carb-Cholecalciferol (CALCIUM 600/VITAMIN D3 PO) Take 2 tablets by mouth daily.    [provider]   glipiZIDE (GLUCOTROL XL) 5 MG 24 hr tablet TAKE 1 TABLET(5 MG) BY MOUTH DAILY WITH BREAKFAST 10/14/20   Brita Romp, NP  glucose blood (ONETOUCH ULTRA) test strip USE 1 STRIP TO CHECK GLUCOSE TWICE DAILY 04/09/20   Fayrene Helper, MD  hydrochlorothiazide (HYDRODIURIL) 25 MG tablet TAKE 1 TABLET(25 MG) BY MOUTH DAILY 07/15/20   Fayrene Helper, MD  Insulin Lispro Prot & Lispro (HUMALOG MIX 75/25 KWIKPEN) (75-25) 100 UNIT/ML Kwikpen Inject 50 Units into the skin 2 (two) times daily with a meal. 12/19/20   Brita Romp, NP  Lancets [facility]  PLUS LANCET33G) MISC USE 1 LANCET  TO CHECK GLUCOSE TWICE DAILY AS DIRECTED 04/09/20   Fayrene Helper, MD  levothyroxine (SYNTHROID) 88 MCG tablet TAKE 1 TABLET(88 MCG) BY MOUTH DAILY BEFORE BREAKFAST 07/15/20   Brita Romp, NP  metFORMIN (GLUCOPHAGE) 500 MG tablet TAKE 1 TABLET(500 MG) BY MOUTH  DAILY WITH A MEAL 07/15/20   Cassandria Anger, MD  Multiple Vitamin (MULTIVITAMIN) capsule Take 1 capsule by mouth daily.    [provider]  omeprazole (PRILOSEC) 20 MG capsule TAKE 1 CAPSULE BY MOUTH EVERY MORNING 01/21/20   Annitta Needs, NP  polyethylene glycol (MIRALAX / GLYCOLAX) 17 g packet Take 17 g by mouth as needed.    [provider]  pravastatin (PRAVACHOL) 40 MG tablet TAKE 1 TABLET(40 MG) BY MOUTH DAILY 09/13/20   Fayrene Helper, MD  psyllium (METAMUCIL) 58.6 % packet Take 1 packet by mouth as needed.    [provider]  UNABLE TO FIND Diabetic shoes x 1 pair, Inserts x 3 pair DX E11.9 03/06/20   Fayrene Helper, MD    Allergies    Simvastatin  Review of Systems   Review of Systems  Constitutional:  Negative for fever.  Respiratory:  Negative for shortness of breath.   Cardiovascular:  Negative for chest pain.  Gastrointestinal:  Negative for abdominal pain and blood in stool.  Musculoskeletal:  Positive for arthralgias.  Neurological:  Negative for syncope and headaches.  All  other systems reviewed and are negative.  Physical Exam Updated Vital Signs BP 113/63   Pulse 95   Temp 99 F (37.2 C) (Oral)   Resp 20   Ht 1.702 m ('5\' 7"' )   Wt 101.2 kg   SpO2 97%   BMI 34.93 kg/m   Physical Exam CONSTITUTIONAL: Well developed/well nourished HEAD: Normocephalic/atraumatic EYES: EOMI/PERRL ENMT: Mucous membranes moist NECK: supple no meningeal signs SPINE/BACK:entire spine nontender, no bruising/crepitance/stepoffs noted to spine CV: S1/S2 noted, no murmurs/rubs/gallops noted LUNGS: Lungs are clear to auscultation bilaterally, no apparent distress Chest-no bruising or crepitus ABDOMEN: soft, nontender, no bruising GU:no cva tenderness NEURO: Pt is awake/alert/appropriate, moves all extremitiesx4.  No facial droop.  GCS 15.  No arm or leg drift is noted EXTREMITIES: pulses normal/equal, full ROM, tenderness to palpation of left shoulder but she has full range of motion.  Mild tenderness to right knee without deformity Bruising and tenderness noted to left great toe SKIN: warm, color normal PSYCH: no abnormalities of mood noted, alert and oriented to situation  ED Results / Procedures / Treatments   Labs (all labs ordered are listed, but only abnormal results are displayed) Labs Reviewed  CBC WITH DIFFERENTIAL/PLATELET - Abnormal; Notable for the following components:      Result Value   Platelets 403 (*)    Neutro Abs 7.9 (*)    All other components within normal limits  COMPREHENSIVE METABOLIC PANEL - Abnormal; Notable for the following components:   Sodium 132 (*)    Chloride 96 (*)    Glucose, Bld 395 (*)    Creatinine, Ser 1.39 (*)    Calcium 8.8 (*)    GFR, Estimated 40 (*)    All other components within normal limits    EKG EKG Interpretation  Date/Time:  Sunday December 29 2020 00:03:29 EDT Ventricular Rate:  100 PR Interval:  155 QRS Duration: 101 QT Interval:  337 QTC Calculation: 435 R Axis:   61 Text Interpretation: Sinus  tachycardia Nonspecific T abnormalities, diffuse  leads Baseline wander in lead(s) V6 Confirmed by Ripley Fraise [phone]) on 12/29/2020 12:13:29 AM  Radiology DG Shoulder Left Port  Result Date: 12/29/2020 CLINICAL DATA:  Fall, shoulder pain EXAM: LEFT SHOULDER COMPARISON:  None. FINDINGS: No fracture or dislocation is seen. Mild degenerative changes of the acromioclavicular and glenohumeral joints. The visualized soft tissues are unremarkable. Visualized left lung is clear. IMPRESSION: No fracture or dislocation is seen. Mild degenerative changes. Electronically Signed   By: Julian Hy M.D.   On: 12/29/2020 00:12   DG Knee Right Port  Result Date: 12/29/2020 CLINICAL DATA:  Fall, right knee pain EXAM: PORTABLE RIGHT KNEE - 1-2 VIEW COMPARISON:  None. FINDINGS: No fracture or dislocation is seen. Moderate tricompartmental degenerative changes, most prominent in the patellofemoral region. Small suprapatellar knee joint effusion. IMPRESSION: No fracture or dislocation is seen. Moderate degenerative changes with small suprapatellar knee joint effusion. Electronically Signed   By: Julian Hy M.D.   On: 12/29/2020 00:15   DG Foot 2 Views Left  Result Date: 12/29/2020 CLINICAL DATA:  Fall, left foot pain EXAM: LEFT FOOT - 2 VIEW COMPARISON:  None. FINDINGS: Fracture with deformity of the distal aspect of the 1st proximal phalanx, with intra-articular extension. Moderate degenerative changes of the 1st MTP joint. Mild soft tissue swelling. IMPRESSION: Fracture involving the distal aspect of the 1st proximal phalanx, as above. Electronically Signed   By: Julian Hy M.D.   On: 12/29/2020 00:13    Procedures Procedures   Medications Ordered in ED Medications  HYDROcodone-acetaminophen (NORCO/VICODIN) 5-325 MG per tablet 1 tablet (1 tablet Oral Given 12/28/20 2358)  HYDROcodone-acetaminophen (NORCO/VICODIN) 5-325 MG per tablet 1 tablet (1 tablet Oral Given 12/29/20 0131)    ED  Course  I have reviewed the triage vital signs and the nursing notes.  Pertinent labs & imaging results that were available during my care of the patient were reviewed by me and considered in my medical decision making (see chart for details).    MDM Rules/Calculators/A&P                           Patient reports she fell while walking from her bed to the dresser.  She reports she briefly lost her balance and fell.  There was no syncope or loss of consciousness She is not on anticoagulation.  No signs of head injury.  She reports she has not fallen in the past year.  Labs reveal hyperglycemia and dehydration.  No anion gap She does have a left great toe fracture. Plan to place in postop shoe, referred to podiatry.  No other signs of acute traumatic injury.  Patient agreeable with plan.  Final Clinical Impression(s) / ED Diagnoses Final diagnoses:  Fall, initial encounter  Sprain of left shoulder, unspecified shoulder sprain type, initial encounter  Closed displaced fracture of proximal phalanx of left great toe, initial encounter  Sprain of right knee, unspecified ligament, initial encounter  Hyperglycemia  Dehydration    Rx / DC Orders ED Discharge Orders          Ordered    HYDROcodone-acetaminophen (NORCO/VICODIN) 5-325 MG tablet  Every 6 hours PRN        12/29/20 0125             Ripley Fraise, MD 12/29/20 [phone]

## 2021-01-01 ENCOUNTER — Telehealth: Payer: Self-pay | Admitting: Orthopedic Surgery

## 2021-01-01 NOTE — Telephone Encounter (Signed)
Patient called - relays she was treated at [facility] Emergency room after having fallen; said her left shoulder and right knee are "okay"; said she has a toe injury for which she was advised to see podiatrist Dr Caprice Beaver, Linna Hoff. States she called their office and was advised that they do not accept her [employer]. Discussed regarding shoulder and/or knee, option of scheduling here if needed; and for podiatry option, to call [place], which is where her primary care, Dr Moshe Cipro had referred previously - said will call there.

## 2021-01-11 ENCOUNTER — Other Ambulatory Visit: Payer: Self-pay | Admitting: Gastroenterology

## 2021-01-11 ENCOUNTER — Other Ambulatory Visit: Payer: Self-pay | Admitting: Nurse Practitioner

## 2021-01-11 ENCOUNTER — Other Ambulatory Visit: Payer: Self-pay | Admitting: Family Medicine

## 2021-01-15 ENCOUNTER — Ambulatory Visit: Payer: Medicare Other | Admitting: Podiatry

## 2021-01-15 ENCOUNTER — Other Ambulatory Visit: Payer: Self-pay

## 2021-01-15 ENCOUNTER — Ambulatory Visit (INDEPENDENT_AMBULATORY_CARE_PROVIDER_SITE_OTHER): Payer: Medicare Other

## 2021-01-15 DIAGNOSIS — M79672 Pain in left foot: Secondary | ICD-10-CM | POA: Diagnosis not present

## 2021-01-15 DIAGNOSIS — S92912A Unspecified fracture of left toe(s), initial encounter for closed fracture: Secondary | ICD-10-CM

## 2021-01-15 MED ORDER — MELOXICAM 15 MG PO TABS
15.0000 mg | ORAL_TABLET | Freq: Every day | ORAL | 1 refills | Status: DC
Start: 2021-01-15 — End: 2021-03-12

## 2021-01-17 ENCOUNTER — Telehealth: Payer: Self-pay | Admitting: Urology

## 2021-01-17 NOTE — Telephone Encounter (Signed)
DOS - 01/29/21  ORIF 1ST LEFT --- [id number]  [employer] EFFECTIVE DATE - 03/02/20  PLAN DEDUCTIBLE - $0.00 OUT OF POCKET - $3,600.00 W/  $3,128.97 REMAINING COINSURANCE - 0% COPAY - $295.00   PER [employer] WEBSITE FOR CPT CODE 90240 Notification or Prior Authorization is not required for the requested services  Decision ID #:[id number]

## 2021-01-20 ENCOUNTER — Other Ambulatory Visit: Payer: Self-pay | Admitting: "Endocrinology

## 2021-01-20 DIAGNOSIS — E1165 Type 2 diabetes mellitus with hyperglycemia: Secondary | ICD-10-CM

## 2021-01-23 NOTE — Progress Notes (Signed)
   HPI: 75 y.o. female presenting today presenting today for evaluation as a new patient regarding an injury to the left great toe.  Patient states that on 12/27/2020 she fell in her bedroom.  She injured her great toe and went to the emergency department on 12/28/2020.  She was diagnosed with a fracture of the toe.  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    Knee pain    Obesity      Physical Exam: General: The patient is alert and oriented x3 in no acute distress.  Dermatology: Skin is warm, dry and supple bilateral lower extremities. Negative for open lesions or macerations.  Vascular: Palpable pedal pulses bilaterally. No edema or erythema noted. Capillary refill within normal limits.  Neurological: Epicritic and protective threshold grossly intact bilaterally.   Musculoskeletal Exam: Range of motion within normal limits to all pedal and ankle joints bilateral. Muscle strength 5/5 in all groups bilateral.   Radiographic Exam 12/28/2020 ED LT foot:  FINDINGS: Fracture with deformity of the distal aspect of the 1st proximal phalanx, with intra-articular extension.   Moderate degenerative changes of the 1st MTP joint.   Mild soft tissue swelling.   IMPRESSION: Fracture involving the distal aspect of the 1st proximal phalanx, as above.  Assessment: 1.  Closed, displaced fracture distal aspect of the first phalanx left great toe 2.  Diabetes mellitus; controlled   Plan of Care:  1. Patient evaluated. X-Rays reviewed.  2. Today we discussed the conservative versus surgical management of the presenting  pathology. The patient opts for surgical management. All possible complications and details of the procedure were explained. All patient questions were answered. No guarantees were expressed or implied. 3. Authorization for surgery was initiated today. Surgery will consist of open reduction internal fixation left great toe fracture 4.  Return to clinic 1 week postop        Edrick Kins, DPM [facility]  Dr. Edrick Kins, DPM    [address], [place] [address]                Office [phone]  Fax [phone]

## 2021-01-29 ENCOUNTER — Encounter: Payer: Self-pay | Admitting: Podiatry

## 2021-01-29 ENCOUNTER — Other Ambulatory Visit: Payer: Self-pay | Admitting: Podiatry

## 2021-01-29 DIAGNOSIS — S92422K Displaced fracture of distal phalanx of left great toe, subsequent encounter for fracture with nonunion: Secondary | ICD-10-CM | POA: Diagnosis not present

## 2021-01-29 DIAGNOSIS — M25572 Pain in left ankle and joints of left foot: Secondary | ICD-10-CM | POA: Diagnosis not present

## 2021-01-29 DIAGNOSIS — X58XXXA Exposure to other specified factors, initial encounter: Secondary | ICD-10-CM | POA: Diagnosis not present

## 2021-01-29 DIAGNOSIS — Y929 Unspecified place or not applicable: Secondary | ICD-10-CM | POA: Diagnosis not present

## 2021-01-29 DIAGNOSIS — S92412K Displaced fracture of proximal phalanx of left great toe, subsequent encounter for fracture with nonunion: Secondary | ICD-10-CM | POA: Diagnosis not present

## 2021-01-29 HISTORY — PX: FOOT SURGERY: SHX648

## 2021-01-29 MED ORDER — HYDROCODONE-ACETAMINOPHEN 5-325 MG PO TABS: 1.0000 | ORAL_TABLET | ORAL | 0 refills | Status: DC | PRN

## 2021-02-04 ENCOUNTER — Other Ambulatory Visit: Payer: Self-pay | Admitting: Family Medicine

## 2021-02-05 ENCOUNTER — Other Ambulatory Visit: Payer: Self-pay

## 2021-02-05 ENCOUNTER — Ambulatory Visit (INDEPENDENT_AMBULATORY_CARE_PROVIDER_SITE_OTHER): Payer: Medicare Other

## 2021-02-05 ENCOUNTER — Ambulatory Visit (INDEPENDENT_AMBULATORY_CARE_PROVIDER_SITE_OTHER): Payer: Medicare Other | Admitting: Podiatry

## 2021-02-05 DIAGNOSIS — S92912A Unspecified fracture of left toe(s), initial encounter for closed fracture: Secondary | ICD-10-CM

## 2021-02-05 NOTE — Progress Notes (Signed)
   Subjective:  Patient presents today status post percutaneous pin fixation and reduction of the proximal phalanx of the left great toe. DOS: 01/29/2021.  Patient states that she is doing well.  She is taking the pain medication as needed.  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    Knee pain    Obesity       Objective/Physical Exam Neurovascular status intact.  The percutaneous fixation pins are intact and stable.  There does not appear to be any movement.  No drainage from the pin sites.  Moderate edema noted to the toe as expected.  There is no erythema.  Radiographic Exam:  Crossing 0.62 K wires noted through the fracture fragments of the proximal phalanx of the toe.  The toe is in a somewhat reduced position.  The distal head of the phalanx does appear to be slightly dorsal on lateral view.  Overall though the positioning of the K wires are intact and stable  Assessment: 1. s/p ORIF left hallux. DOS: 01/29/2021   Plan of Care:  1. Patient was evaluated. X-rays reviewed 2.  Patient may continue minimal weightbearing in the postsurgical shoe with the assistance of a cane 3.  Patient may begin washing and showering and getting the foot wet 4.  Recommend Betadine ointment which was provided for the patient today to the pinhole sites daily 5.  Return to clinic in 2 weeks for follow-up x-ray   Edrick Kins, DPM [facility]  Dr. Edrick Kins, DPM    [address], [place] [address]                Office [phone]  Fax [phone]

## 2021-02-12 ENCOUNTER — Encounter: Payer: Medicare Other | Admitting: Podiatry

## 2021-02-17 ENCOUNTER — Encounter (HOSPITAL_COMMUNITY): Payer: Self-pay | Admitting: Physical Therapy

## 2021-02-17 NOTE — Therapy (Signed)
[name], [place], [address] Phone: [phone]   Fax:  [phone]  Patient Details  Name: Stacey Hanson MRN: [id number] Date of Birth: Jul 18, 1945 Referring Provider:  No ref. provider found  Encounter Date: 02/17/2021  PHYSICAL THERAPY DISCHARGE SUMMARY  Visits from Start of Care: 2  Current functional level related to goals / functional outcomes: Unknown as patient had not returned.   Remaining deficits: Unknown   Education / Equipment: Unknown   Patient agrees to discharge. Patient goals were not met. Patient is being discharged due to not returning since the last visit.   9:43 AM, 02/17/21 Mearl Latin PT, DPT Physical Therapist at [facility] [address] [place], [place], [address] Phone: [phone]   Fax:  [phone]

## 2021-02-18 ENCOUNTER — Encounter: Payer: Self-pay | Admitting: Internal Medicine

## 2021-02-18 ENCOUNTER — Encounter: Payer: Medicare Other | Admitting: Family Medicine

## 2021-02-19 ENCOUNTER — Ambulatory Visit (INDEPENDENT_AMBULATORY_CARE_PROVIDER_SITE_OTHER): Payer: Medicare Other | Admitting: Podiatry

## 2021-02-19 ENCOUNTER — Ambulatory Visit (INDEPENDENT_AMBULATORY_CARE_PROVIDER_SITE_OTHER): Payer: Medicare Other

## 2021-02-19 ENCOUNTER — Other Ambulatory Visit: Payer: Self-pay

## 2021-02-19 DIAGNOSIS — S92912A Unspecified fracture of left toe(s), initial encounter for closed fracture: Secondary | ICD-10-CM

## 2021-02-19 MED ORDER — HYDROCODONE-ACETAMINOPHEN 5-325 MG PO TABS: 1.0000 | ORAL_TABLET | ORAL | 0 refills | Status: DC | PRN

## 2021-02-19 NOTE — Progress Notes (Signed)
° °  Subjective:  Patient presents today status post percutaneous pin fixation and reduction of the proximal phalanx of the left great toe. DOS: 01/29/2021.  Patient states that she is doing well.  She is weightbearing in the surgical shoe as instructed.  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    Knee pain    Obesity       Objective/Physical Exam Neurovascular status intact.  The percutaneous fixation pins are intact and stable with the distal lateral to proximal medial pin slightly backed out.  Overall they are stable the.  There does not appear to be any movement.  No drainage from the pin sites.  Moderate edema noted to the toe as expected.  There is no erythema.  Radiographic Exam:  Crossing 0.62 K wires noted through the fracture fragments of the proximal phalanx of the toe.  The pins are stable however the 0.62 K wire oriented distal lateral to proximal medial appears to be backing out slightly.  Overall though the fracture fragments are stable and in decent alignment  Assessment: 1. s/p ORIF left hallux. DOS: 01/29/2021   Plan of Care:  1. Patient was evaluated. X-rays reviewed.  The percutaneous pin that was slightly backing out was cleansed aseptically with Betadine and pushed back in about 3 mm.  Ideally I would like to leave the pins in for an additional 3 weeks minimal if the patient can tolerate this.  She says at the moment it is tolerable 2.  Patient may continue minimal weightbearing in the postsurgical shoe with the assistance of a cane 3.  Continue Betadine ointment daily to the pin tracts 4.  Refill prescription for Vicodin  5/325 mg as needed pain 5.  Return to clinic in 3 weeks for percutaneous pin removal   Edrick Kins, DPM [facility]  Dr. Edrick Kins, DPM    [address], [place] [address]                Office [phone]  Fax [phone]

## 2021-02-25 ENCOUNTER — Ambulatory Visit (INDEPENDENT_AMBULATORY_CARE_PROVIDER_SITE_OTHER): Payer: Medicare Other

## 2021-02-25 ENCOUNTER — Other Ambulatory Visit: Payer: Self-pay

## 2021-02-25 DIAGNOSIS — Z0000 Encounter for general adult medical examination without abnormal findings: Secondary | ICD-10-CM | POA: Diagnosis not present

## 2021-02-25 NOTE — Progress Notes (Signed)
Subjective:   Stacey Hanson is a 75 y.o. female who presents for Medicare Annual (Subsequent) preventive examination. I connected with  Stacey Hanson on 02/25/21 by a audio enabled telemedicine application and verified that I am speaking with the correct person using two identifiers.  Patient Location: Home  Provider Location: Office/Clinic  I discussed the limitations of evaluation and management by telemedicine. The patient expressed understanding and agreed to proceed.  Review of Systems    Defer to PCP Cardiac Risk Factors include: advanced age (>43mn, >>28women);diabetes mellitus;hypertension;obesity (BMI >30kg/m2)     Objective:    Today's Vitals   02/25/21 0758  PainSc: 8    There is no height or weight on file to calculate BMI.  Advanced Directives 02/25/2021 02/20/2021 12/28/2020 09/18/2020 02/15/2020 03/27/2019 03/09/2019  Does Patient Have a Medical Advance Directive? No Yes _0   Does patient want to make changes to medical advance directive? Yes (ED - Information included in AVS) - - - - - -  Would patient like information on creating a medical advance directive? No - Patient declined - No - Patient declined No - Patient declined No - Patient declined No - Patient declined No - Patient declined  Pre-existing out of facility DNR order (yellow form or pink MOST form) - - - - - - -    Current Medications (verified) Outpatient Encounter Medications as of 02/25/2021  Medication Sig   amLODipine (NORVASC) 5 MG tablet Take 1 tablet (5 mg total) by mouth daily.   aspirin EC 81 MG tablet Take 81 mg by mouth daily.   B-D UF III MINI PEN NEEDLES 31G X 5 MM MISC USE FOR TWICE DAILY INJECTIONS OF HUMALOG   B-D ULTRAFINE III SHORT PEN 31G X 8 MM MISC USE TO INJECT INSULIN TWICE DAILY   benazepril (LOTENSIN) 40 MG tablet TAKE 1 TABLET(40 MG) BY MOUTH DAILY   Blood Glucose Monitoring Suppl (ONE TOUCH ULTRA 2) w/Device KIT Twice daily testing dx e11.65   Calcium  Carb-Cholecalciferol (CALCIUM 600/VITAMIN D3 PO) Take 2 tablets by mouth daily.   glipiZIDE (GLUCOTROL XL) 5 MG 24 hr tablet TAKE 1 TABLET(5 MG) BY MOUTH DAILY WITH BREAKFAST   glucose blood (ONETOUCH ULTRA) test strip USE 1 STRIP TO CHECK GLUCOSE TWICE DAILY   hydrochlorothiazide (HYDRODIURIL) 25 MG tablet TAKE 1 TABLET(25 MG) BY MOUTH DAILY   HYDROcodone-acetaminophen (NORCO/VICODIN) 5-325 MG tablet Take 1 tablet by mouth every 4 (four) hours as needed for severe pain.   Insulin Lispro Prot & Lispro (HUMALOG MIX 75/25 KWIKPEN) (75-25) 100 UNIT/ML Kwikpen Inject 50 Units into the skin 2 (two) times daily with a meal.   Lancets (ONETOUCH DELICA PLUS [id number] MISC USE 1 LANCET  TO CHECK GLUCOSE TWICE DAILY AS DIRECTED   levothyroxine (SYNTHROID) 88 MCG tablet TAKE 1 TABLET(88 MCG) BY MOUTH DAILY BEFORE AND BREAKFAST   meloxicam (MOBIC) 15 MG tablet Take 1 tablet (15 mg total) by mouth daily.   metFORMIN (GLUCOPHAGE) 500 MG tablet TAKE 1 TABLET(500 MG) BY MOUTH DAILY WITH A MEAL   Multiple Vitamin (MULTIVITAMIN) capsule Take 1 capsule by mouth daily.   omeprazole (PRILOSEC) 20 MG capsule TAKE 1 CAPSULE BY MOUTH EVERY MORNING   polyethylene glycol (MIRALAX / GLYCOLAX) 17 g packet Take 17 g by mouth as needed.   pravastatin (PRAVACHOL) 40 MG tablet TAKE 1 TABLET(40 MG) BY MOUTH DAILY   psyllium (METAMUCIL) 58.6 % packet Take 1 packet by mouth as needed.   UNABLE  TO FIND Diabetic shoes x 1 pair, Inserts x 3 pair DX E11.9   MODERNA COVID-19 VACCINE 100 MCG/0.5ML injection    No facility-administered encounter medications on file as of 02/25/2021.    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 pylori infection 09/24/2010   Hyperlipidemia    Hypertension    Hypothyroidism     IDDM (insulin dependent diabetes mellitus) 1987   type 2    Knee pain    Obesity    Past Surgical History:  Procedure Laterality Date   ABDOMINAL HYSTERECTOMY  1970   CATARACT EXTRACTION W/PHACO Left 03/09/2019   Procedure: CATARACT EXTRACTION PHACO AND INTRAOCULAR LENS PLACEMENT ([place]);  Surgeon: Stacey Hanson;  Location: [facility];  Service: Ophthalmology;  Laterality: Left;  CDE: 9.08   CATARACT EXTRACTION W/PHACO Right 03/27/2019   Procedure: CATARACT EXTRACTION PHACO AND INTRAOCULAR LENS PLACEMENT RIGHT EYE;  Surgeon: Stacey Hanson;  Location: [facility];  Service: Ophthalmology;  Laterality: Right;  CDE: 7.47   CHOLECYSTECTOMY  OCT 2010 BZ BILIARY DYSKINESIA   CHRONIC CHOLECYSTITIS   COLONOSCOPY  MAY 2009 SCREENING   pTC TICS, [facility]   COLONOSCOPY N/A 08/11/2017   Procedure: COLONOSCOPY;  Surgeon: Stacey Hanson;  Location: [facility];  Service: Endoscopy;  Laterality: N/A;  9:30am   ESOPHAGOGASTRODUODENOSCOPY  10/03/2010   Procedure: ESOPHAGOGASTRODUODENOSCOPY (EGD);  Surgeon: Stacey Hanson;  Location: [facility];  Service: Endoscopy;  Laterality: N/A;   ESOPHAGOGASTRODUODENOSCOPY  12/19/2002   RMR: Normal esophagus/Normal stomach/ Duodenum large bulbar diverticulum, 1 cm bulbar ulcer with surrounding/ inflammation as described above. Normal D2   POLYPECTOMY  08/11/2017   Procedure: POLYPECTOMY;  Surgeon: Stacey Hanson;  Location: [facility];  Service: Endoscopy;;  cecal    SAVORY DILATION  10/03/2010   Procedure: SAVORY DILATION;  Surgeon: Stacey Hanson;  Location: [facility];  Service: Endoscopy;;   TOTAL ABDOMINAL HYSTERECTOMY  1970   TOTAL KNEE ARTHROPLASTY Left 12/21/2017   Procedure: LEFT TOTAL KNEE ARTHROPLASTY;  Surgeon: Stacey Hanson;  Location: [facility];  Service: Orthopedics;  Laterality: Left;  70 mins block   UPPER GASTROINTESTINAL ENDOSCOPY  OCT 2004 RMR   DUO TIC, & ULCER   Family History  Problem Relation Age of Onset   Diabetes  Mother    Hypertension Mother    Heart failure Mother    Cancer Father 66       prostate and colon   Diabetes Sister    Hypertension Sister    Hypertension Sister    Diabetes Sister    Diabetes Brother    Hypertension Brother    Diabetes Brother    Hypertension Brother    Coronary artery disease Brother 39       MI   Alcohol abuse Son    Colon cancer Neg Hx    Colon polyps Neg Hx    Social History   Socioeconomic History   Marital status: Single    Spouse name: Not on file   Number of children: 3   Years of education: 10   Highest education level: 10th grade  Occupational History   Occupation: [employer]-OR   Occupation: Retired   Tobacco Use  Smoking status: Former    Packs/day: 0.25    Years: 10.00    Pack years: 2.50    Types: Cigarettes    Quit date: 02/20/1985    Years since quitting: 36.0   Smokeless tobacco: Never  Vaping Use   Vaping Use: Never used  Substance and Sexual Activity   Alcohol use: No    Comment: stopped drinking 1998   Drug use: No   Sexual activity: Not Currently  Other Topics Concern   Not on file  Social History Narrative   Lives alone and she retired from [employer] Strain: Low Risk    Difficulty of Paying Living Expenses: Not hard at all  Food Insecurity: No Food Insecurity   Worried About Charity fundraiser in the Last Year: Never true   Arboriculturist in the Last Year: Never true  Transportation Needs: No Transportation Needs   Lack of Transportation (Medical): No   Lack of Transportation (Non-Medical): No  Physical Activity: Inactive   Days of Exercise per Week: 0 days   Minutes of Exercise per Session: 0 min  Stress: No Stress Concern Present   Feeling of Stress : Only a little  Social Connections: Moderately Isolated   Frequency of Communication with Friends and Family: More than three times a week   Frequency of Social Gatherings with Friends and Family:  More than three times a week   Attends Religious Services: Never   Marine scientist or Organizations: Yes   Attends Music therapist: 1 to 4 times per year   Marital Status: Never married    Tobacco Counseling Counseling given: Not Answered   Clinical Intake:  Pre-visit preparation completed: No  Pain : 0-10 Pain Score: 8  Pain Type: Acute pain Pain Location: Toe (Comment which one) Pain Orientation: Other (Comment) (left foot big toe) Pain Descriptors / Indicators: Aching Pain Onset: More than a month ago Pain Frequency: Intermittent Pain Relieving Factors: Taking pain relivers dr gave Effect of Pain on Daily Activities: Pt states her toe was injured and she had surgery on it in November  Pain Relieving Factors: Taking pain relivers dr gave  Nutritional Risks: None Diabetes: Yes CBG done?: No Did pt. bring in CBG monitor from home?: No  How often do you need to have someone help you when you read instructions, pamphlets, or other written materials from your doctor or pharmacy?: 1 - Never What is the last grade level you completed in school?: 10th grade  Diabetic?yes Nutrition Risk Assessment:  Has the patient had any N/V/D within the last 2 months?  No  Does the patient have any non-healing wounds?  No  Has the patient had any unintentional weight loss or weight gain?  No   Diabetes:  Is the patient diabetic?  Yes  If diabetic, was a CBG obtained today?  No  Did the patient bring in their glucometer from home?  No  How often do you monitor your CBG's? Twice daily.   Financial Strains and Diabetes Management:  Are you having any financial strains with the device, your supplies or your medication? No .  Does the patient want to be seen by Chronic Care Management for management of their diabetes?  YES Would the patient like to be referred to a Nutritionist or for Diabetic Management?  No   Diabetic Exams:  Diabetic Eye Exam: Completed  08/20/2020 Diabetic Foot  Exam: Completed 03/06/2020    Interpreter Needed?: No  Comments: Pt needs in home help and transportation help Information entered by :: [place] of Daily Living In your present state of health, do you have any difficulty performing the following activities: 02/25/2021  Hearing? N  Vision? Y  Difficulty concentrating or making decisions? Y  Walking or climbing stairs? Y  Dressing or bathing? N  Doing errands, shopping? Y  Comment until her toe is healed  Conservation officer, nature and eating ? N  Using the Toilet? N  In the past six months, have you accidently leaked urine? N  Do you have problems with loss of bowel control? N  Managing your Medications? N  Managing your Finances? N  Housekeeping or managing your Housekeeping? Y  Comment just until her toe heals  Some recent data might be hidden    Patient Care Team: Fayrene Helper, Hanson as PCP - General [place] [facility] Lenise Herald, Hanson (Inactive) as PCP - Cardiology (Cardiology) Stacey Hanson (Inactive) (Gastroenterology) Leta Baptist, Hanson as Attending Physician (Otolaryngology) Cassandria Anger, Hanson (Endocrinology) Carole Civil, Hanson as Consulting Physician (Orthopedic Surgery) Madelin Headings, DO (Optometry) Bernell List, CPhT as [place] Management (Pharmacy Technician) Eloise Harman, DO as Consulting Physician (Internal Medicine)  Indicate any recent Medical Services you may have received from other than [facility] providers in the past year (date may be approximate).     Assessment:   This is a routine wellness examination for Isabell.  Hearing/Vision screen No results found.  Dietary issues and exercise activities discussed: Current Exercise Habits: The patient does not participate in regular exercise at present, Exercise limited by: Other - see comments (Surgery on toe and toe is not healed yet)   Goals Addressed             This Visit's Progress     Increase physical activity       Pt states she has not increased activity due to covid.     Patient Stated       Want to get my knees well  Pt states her knees are better      Depression Screen PHQ 2/9 Scores 02/25/2021 12/10/2020 08/08/2020 02/15/2020 12/21/2019 05/25/2019 02/14/2019  PHQ - 2 Score 0 1 0 0 0 0 1  PHQ- 9 Score - 4 - - - - -    Fall Risk Fall Risk  02/25/2021 12/10/2020 08/08/2020 06/26/2020 03/06/2020  Falls in the past year? 1 0 0 0 0  Number falls in past yr: 0 - 0 0 0  Injury with Fall? 1 - 0 0 0  Risk for fall due to : History of fall(s) - No Fall Risks - -  Follow up Falls evaluation completed - Falls evaluation completed - -    FALL RISK PREVENTION PERTAINING TO THE HOME:  Any stairs in or around the home? No  If so, are there any without handrails?  N/a Home free of loose throw rugs in walkways, pet beds, electrical cords, etc? No  Adequate lighting in your home to reduce risk of falls? Yes   ASSISTIVE DEVICES UTILIZED TO PREVENT FALLS:  Life alert? No  Use of a cane, walker or w/c? Yes  Grab bars in the bathroom? Yes  Shower chair or bench in shower? Yes  Elevated toilet seat or a handicapped toilet? No    Cognitive Function:     6CIT Screen 02/25/2021 02/15/2020 02/14/2019 02/09/2018 02/25/2017  What Year? 0 points 0 points 0 points 0 points 0 points  What month? 0 points 0 points 0 points 0 points 0 points  What time? 0 points 0 points 0 points 0 points 0 points  Count back from 20 0 points 0 points 0 points 0 points 0 points  Months in reverse 0 points 0 points 0 points 0 points 0 points  Repeat phrase 2 points 0 points 0 points 0 points 0 points  Total Score 2 0 0 0 0    Immunizations Immunization History  Administered Date(s) Administered   Fluad Quad(high Dose 65+) 11/23/2018, 12/21/2019, 12/10/2020   Influenza,inj,Quad PF,6+ Mos 11/16/2012, 11/15/2013, 11/14/2014, 11/14/2015, 11/03/2016, 11/09/2017   Influenza,inj,quad, With  Preservative 11/02/2016   Moderna SARS-COV2 Booster Vaccination 07/24/2020   Moderna Sars-Covid-2 Vaccination 04/27/2019, 05/26/2019, 01/08/2020   Pneumococcal Conjugate-13 10/11/2013   Pneumococcal Polysaccharide-23 10/07/2011   Zoster, Live 04/05/2006   TDAP Due  Flu Vaccine status: Up to date  Pneumococcal vaccine status: Up to date  Covid-19 vaccine status: Information provided on how to obtain vaccines.   Qualifies for Shingles Vaccine? Yes   Zostavax completed No   Shingrix Completed?: No.    Education has been provided regarding the importance of this vaccine. Patient has been advised to call insurance company to determine out of pocket expense if they have not yet received this vaccine. Advised may also receive vaccine at local pharmacy or Health Dept. Verbalized acceptance and understanding.  Screening Tests Health Maintenance  Topic Date Due   Zoster Vaccines- Shingrix (1 of 2) Never done   TETANUS/TDAP  08/01/2019   COVID-19 Vaccine (4 - Booster for Moderna series) 09/18/2020   FOOT EXAM  03/06/2021   HEMOGLOBIN A1C  06/19/2021   OPHTHALMOLOGY EXAM  08/20/2021   COLONOSCOPY (Pts 45-30yr Insurance coverage will need to be confirmed)  08/12/2027   Pneumonia Vaccine 75 Years old  Completed   INFLUENZA VACCINE  Completed   DEXA SCAN  Completed   Hepatitis C Screening  Completed   HPV VACCINES  Aged Out    Health Maintenance  Health Maintenance Due  Topic Date Due   Zoster Vaccines- Shingrix (1 of 2) Never done   TETANUS/TDAP  08/01/2019   COVID-19 Vaccine (4 - Booster for Moderna series) 09/18/2020    Colorectal cancer screening: Type of screening: Colonoscopy. Completed 08/11/2017. Repeat every 10 years  Mammogram status: Completed 08/26/2020. Repeat every year  Bone Density status: Completed 03/04/2017. Results reflect: Bone density results: NORMAL. Repeat every 5 years.  Lung Cancer Screening: (Low Dose CT Chest recommended if Age 75-80years, 30  pack-year currently smoking OR have quit w/in 15years.) does not qualify.   Lung Cancer Screening Referral: N/A  Additional Screening:  Hepatitis C Screening: does qualify; Completed 04/17/2015  Vision Screening: Recommended annual ophthalmology exams for early detection of glaucoma and other disorders of the eye. Is the patient up to date with their annual eye exam?  Yes  Who is the provider or what is the name of the office in which the patient attends annual eye exams? [facility] If pt is not established with a provider, would they like to be referred to a provider to establish care?  N/A .   Dental Screening: Recommended annual dental exams for proper oral hygiene  Community Resource Referral / Chronic Care Management: CRR required this visit?  Yes   CCM required this visit?  No      Plan:     I have  personally reviewed and noted the following in the patients chart:   Medical and social history Use of alcohol, tobacco or illicit drugs  Current medications and supplements including opioid prescriptions.  Functional ability and status Nutritional status Physical activity Advanced directives List of other physicians Hospitalizations, surgeries, and ER visits in previous 12 months Vitals Screenings to include cognitive, depression, and falls Referrals and appointments  In addition, I have reviewed and discussed with patient certain preventive protocols, quality metrics, and best practice recommendations. A written personalized care plan for preventive services as well as general preventive health recommendations were provided to patient.     Earline Mayotte, [place]   02/25/2021   Nurse Notes:  Ms. Mccauslin , Thank you for taking time to come for your Medicare Wellness Visit. I appreciate your ongoing commitment to your health goals. Please review the following plan we discussed and let me know if I can assist you in the future.   These are the goals we discussed:   Goals      Increase physical activity     Pt states she has not increased activity due to covid.     Patient Stated     Want to get my knees well  Pt states her knees are better        This is a list of the screening recommended for you and due dates:  Health Maintenance  Topic Date Due   Zoster (Shingles) Vaccine (1 of 2) Never done   Tetanus Vaccine  08/01/2019   COVID-19 Vaccine (4 - Booster for Moderna series) 09/18/2020   Complete foot exam   03/06/2021   Hemoglobin A1C  06/19/2021   Eye exam for diabetics  08/20/2021   Colon Cancer Screening  08/12/2027   Pneumonia Vaccine  Completed   Flu Shot  Completed   DEXA scan (bone density measurement)  Completed   Hepatitis C Screening: USPSTF Recommendation to screen - Ages 1-79 yo.  Completed   HPV Vaccine  Aged Out

## 2021-02-25 NOTE — Patient Instructions (Addendum)
Stacey Hanson , Thank you for taking time to come for your Medicare Wellness Visit. I appreciate your ongoing commitment to your health goals. Please review the following plan we discussed and let me know if I can assist you in the future.   These are the goals we discussed:  Goals      Increase physical activity     Pt states she has not increased activity due to covid.     Patient Stated     Want to get my knees well  Pt states her knees are better        This is a list of the screening recommended for you and due dates:  Health Maintenance  Topic Date Due   Zoster (Shingles) Vaccine (1 of 2) Never done   Tetanus Vaccine  08/01/2019   COVID-19 Vaccine (4 - Booster for Moderna series) 09/18/2020   Complete foot exam   03/06/2021   Hemoglobin A1C  06/19/2021   Eye exam for diabetics  08/20/2021   Colon Cancer Screening  08/12/2027   Pneumonia Vaccine  Completed   Flu Shot  Completed   DEXA scan (bone density measurement)  Completed   Hepatitis C Screening: USPSTF Recommendation to screen - Ages 21-79 yo.  Completed   HPV Vaccine  Aged Out     Health Maintenance, Female Adopting a healthy lifestyle and getting preventive care are important in promoting health and wellness. Ask your health care provider about: The right schedule for you to have regular tests and exams. Things you can do on your own to prevent diseases and keep yourself healthy. What should I know about diet, weight, and exercise? Eat a healthy diet  Eat a diet that includes plenty of vegetables, fruits, low-fat dairy products, and lean protein. Do not eat a lot of foods that are high in solid fats, added sugars, or sodium. Maintain a healthy weight Body mass index (BMI) is used to identify weight problems. It estimates body fat based on height and weight. Your health care provider can help determine your BMI and help you achieve or maintain a healthy weight. Get regular exercise Get regular exercise. This  is one of the most important things you can do for your health. Most adults should: Exercise for at least 150 minutes each week. The exercise should increase your heart rate and make you sweat (moderate-intensity exercise). Do strengthening exercises at least twice a week. This is in addition to the moderate-intensity exercise. Spend less time sitting. Even light physical activity can be beneficial. Watch cholesterol and blood lipids Have your blood tested for lipids and cholesterol at 75 years of age, then have this test every 5 years. Have your cholesterol levels checked more often if: Your lipid or cholesterol levels are high. You are older than 75 years of age. You are at high risk for heart disease. What should I know about cancer screening? Depending on your health history and family history, you may need to have cancer screening at various ages. This may include screening for: Breast cancer. Cervical cancer. Colorectal cancer. Skin cancer. Lung cancer. What should I know about heart disease, diabetes, and high blood pressure? Blood pressure and heart disease High blood pressure causes heart disease and increases the risk of stroke. This is more likely to develop in people who have high blood pressure readings or are overweight. Have your blood pressure checked: Every 3-5 years if you are 35-78 years of age. Every year if you are 40 years  old or older. Diabetes Have regular diabetes screenings. This checks your fasting blood sugar level. Have the screening done: Once every three years after age 71 if you are at a normal weight and have a low risk for diabetes. More often and at a younger age if you are overweight or have a high risk for diabetes. What should I know about preventing infection? Hepatitis B If you have a higher risk for hepatitis B, you should be screened for this virus. Talk with your health care provider to find out if you are at risk for hepatitis B  infection. Hepatitis C Testing is recommended for: Everyone born from [birth year] through 1965. Anyone with known risk factors for hepatitis C. Sexually transmitted infections (STIs) Get screened for STIs, including gonorrhea and chlamydia, if: You are sexually active and are younger than 75 years of age. You are older than 75 years of age and your health care provider tells you that you are at risk for this type of infection. Your sexual activity has changed since you were last screened, and you are at increased risk for chlamydia or gonorrhea. Ask your health care provider if you are at risk. Ask your health care provider about whether you are at high risk for HIV. Your health care provider may recommend a prescription medicine to help prevent HIV infection. If you choose to take medicine to prevent HIV, you should first get tested for HIV. You should then be tested every 3 months for as long as you are taking the medicine. Pregnancy If you are about to stop having your period (premenopausal) and you may become pregnant, seek counseling before you get pregnant. Take 400 to 800 micrograms (mcg) of folic acid every day if you become pregnant. Ask for birth control (contraception) if you want to prevent pregnancy. Osteoporosis and menopause Osteoporosis is a disease in which the bones lose minerals and strength with aging. This can result in bone fractures. If you are 29 years old or older, or if you are at risk for osteoporosis and fractures, ask your health care provider if you should: Be screened for bone loss. Take a calcium or vitamin D supplement to lower your risk of fractures. Be given hormone replacement therapy (HRT) to treat symptoms of menopause. Follow these instructions at home: Alcohol use Do not drink alcohol if: Your health care provider tells you not to drink. You are pregnant, may be pregnant, or are planning to become pregnant. If you drink alcohol: Limit how much you have  to: 0-1 drink a day. Know how much alcohol is in your drink. In the [place] one drink equals one 12 oz bottle of beer (355 mL), one 5 oz glass of wine (148 mL), or one 1 oz glass of hard liquor (44 mL). Lifestyle Do not use any products that contain nicotine or tobacco. These products include cigarettes, chewing tobacco, and vaping devices, such as e-cigarettes. If you need help quitting, ask your health care provider. Do not use street drugs. Do not share needles. Ask your health care provider for help if you need support or information about quitting drugs. General instructions Schedule regular health, dental, and eye exams. Stay current with your vaccines. Tell your health care provider if: You often feel depressed. You have ever been abused or do not feel safe at home. Summary Adopting a healthy lifestyle and getting preventive care are important in promoting health and wellness. Follow your health care provider's instructions about healthy diet, exercising, and getting tested  or screened for diseases. Follow your health care provider's instructions on monitoring your cholesterol and blood pressure. This information is not intended to replace advice given to you by your health care provider. Make sure you discuss any questions you have with your health care provider. Document Revised: 07/08/2020 Document Reviewed: 07/08/2020 Elsevier Patient Education  [place].

## 2021-02-27 ENCOUNTER — Other Ambulatory Visit: Payer: Self-pay | Admitting: Family Medicine

## 2021-02-27 DIAGNOSIS — E1165 Type 2 diabetes mellitus with hyperglycemia: Secondary | ICD-10-CM

## 2021-03-05 ENCOUNTER — Other Ambulatory Visit: Payer: Self-pay

## 2021-03-05 ENCOUNTER — Ambulatory Visit (INDEPENDENT_AMBULATORY_CARE_PROVIDER_SITE_OTHER): Payer: Medicare Other | Admitting: Podiatry

## 2021-03-05 ENCOUNTER — Ambulatory Visit (INDEPENDENT_AMBULATORY_CARE_PROVIDER_SITE_OTHER): Payer: Medicare Other

## 2021-03-05 DIAGNOSIS — Z9889 Other specified postprocedural states: Secondary | ICD-10-CM

## 2021-03-05 NOTE — Progress Notes (Signed)
Subjective:  Patient presents today status post percutaneous pin fixation and reduction of the proximal phalanx of the left great toe. DOS: 01/29/2021.  Patient is doing well.  She has been weightbearing in the surgical shoe with the assistance of a cane.  She presents to have the pins removed today.  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    Knee pain    Obesity    Past Surgical History:  Procedure Laterality Date   ABDOMINAL HYSTERECTOMY  1970   CATARACT EXTRACTION W/PHACO Left 03/09/2019   Procedure: CATARACT EXTRACTION PHACO AND INTRAOCULAR LENS PLACEMENT ([place]);  Surgeon: Baruch Goldmann, MD;  Location: [facility];  Service: Ophthalmology;  Laterality: Left;  CDE: 9.08   CATARACT EXTRACTION W/PHACO Right 03/27/2019   Procedure: CATARACT EXTRACTION PHACO AND INTRAOCULAR LENS PLACEMENT RIGHT EYE;  Surgeon: Baruch Goldmann, MD;  Location: [facility];  Service: Ophthalmology;  Laterality: Right;  CDE: 7.47   CHOLECYSTECTOMY  OCT 2010 BZ BILIARY DYSKINESIA   CHRONIC CHOLECYSTITIS   COLONOSCOPY  MAY 2009 SCREENING   pTC TICS, [facility]   COLONOSCOPY N/A 08/11/2017   Procedure: COLONOSCOPY;  Surgeon: Danie Binder, MD;  Location: [facility];  Service: Endoscopy;  Laterality: N/A;  9:30am   ESOPHAGOGASTRODUODENOSCOPY  10/03/2010   Procedure: ESOPHAGOGASTRODUODENOSCOPY (EGD);  Surgeon: Dorothyann Peng, MD;  Location: [facility];  Service: Endoscopy;  Laterality: N/A;   ESOPHAGOGASTRODUODENOSCOPY  12/19/2002   RMR: Normal esophagus/Normal stomach/ Duodenum large bulbar diverticulum, 1 cm bulbar ulcer with surrounding/ inflammation as  described above. Normal D2   POLYPECTOMY  08/11/2017   Procedure: POLYPECTOMY;  Surgeon: Danie Binder, MD;  Location: [facility];  Service: Endoscopy;;  cecal    SAVORY DILATION  10/03/2010   Procedure: SAVORY DILATION;  Surgeon: Dorothyann Peng, MD;  Location: [facility];  Service: Endoscopy;;   TOTAL ABDOMINAL HYSTERECTOMY  1970   TOTAL KNEE ARTHROPLASTY Left 12/21/2017   Procedure: LEFT TOTAL KNEE ARTHROPLASTY;  Surgeon: Paralee Cancel, MD;  Location: [facility];  Service: Orthopedics;  Laterality: Left;  70 mins block   UPPER GASTROINTESTINAL ENDOSCOPY  OCT 2004 RMR   DUO TIC, & ULCER   Allergies  Allergen Reactions   Simvastatin Other (See Comments)    Muscle cramps     Objective/Physical Exam Neurovascular status intact.  The percutaneous fixation pins are intact and stable.  No drainage from the pin sites.  Moderate edema noted to the toe as expected.  There is no erythema.    Radiographic Exam:  Percutaneous fixation pins removed and absent on x-ray today.  No significant change from prior x-rays.  Fractures in decent alignment.  The distal head of the proximal phalanx does appear to be dorsally displaced.  Assessment: 1. s/p ORIF left hallux comminuted intra-articular fracture. DOS: 01/29/2021   Plan of Care:  1. Patient was evaluated. X-rays reviewed.  2.  Percutaneous fixation pins removed.  Light dressing applied. 3.  Continue weightbearing in the postsurgical shoe x4 additional weeks with the assistance of a cane 4.  Return  to clinic in 4 weeks for follow-up x-ray  Edrick Kins, DPM [facility]  Dr. Edrick Kins, DPM    [address], [place] [address]                Office [phone]  Fax [phone]

## 2021-03-12 ENCOUNTER — Other Ambulatory Visit: Payer: Self-pay

## 2021-03-12 ENCOUNTER — Other Ambulatory Visit: Payer: Self-pay | Admitting: Family Medicine

## 2021-03-12 ENCOUNTER — Ambulatory Visit (INDEPENDENT_AMBULATORY_CARE_PROVIDER_SITE_OTHER): Payer: Medicare Other | Admitting: Family Medicine

## 2021-03-12 ENCOUNTER — Encounter: Payer: Self-pay | Admitting: Family Medicine

## 2021-03-12 VITALS — BP 130/70 | HR 92 | Ht 67.0 in | Wt 221.0 lb

## 2021-03-12 DIAGNOSIS — Z0000 Encounter for general adult medical examination without abnormal findings: Secondary | ICD-10-CM

## 2021-03-12 DIAGNOSIS — E1165 Type 2 diabetes mellitus with hyperglycemia: Secondary | ICD-10-CM

## 2021-03-12 DIAGNOSIS — Z5982 Transportation insecurity: Secondary | ICD-10-CM

## 2021-03-12 DIAGNOSIS — Z794 Long term (current) use of insulin: Secondary | ICD-10-CM

## 2021-03-12 DIAGNOSIS — E1159 Type 2 diabetes mellitus with other circulatory complications: Secondary | ICD-10-CM

## 2021-03-12 DIAGNOSIS — M8580 Other specified disorders of bone density and structure, unspecified site: Secondary | ICD-10-CM

## 2021-03-12 MED ORDER — ALENDRONATE SODIUM 70 MG PO TABS: 70.0000 mg | ORAL_TABLET | ORAL | 11 refills | Status: DC

## 2021-03-12 MED ORDER — AMLODIPINE BESY-BENAZEPRIL HCL 5-40 MG PO CAPS: 1.0000 | ORAL_CAPSULE | Freq: Every day | ORAL | 5 refills | Status: DC

## 2021-03-12 NOTE — Patient Instructions (Addendum)
Annual exam in 1 year, follow up in 3 months  Nurse please confirm tetanus administered at [facility] [address], uncertain of Td or TdAP  Foot exam today  You are referred to pharmacist re diabetic testing   NEW for blood pressure is capsule amlodipine5/benazepril 40 mg one daily, stop taking these 2 tablets separately  New due to fracture is once weekly alendronate  You are referred to Podiatry  You are referred for bone density test  You are referred to social work for evaluation for assistance needed and resources available  Thanks for choosing [facility], we consider it a privelige to serve you.

## 2021-03-12 NOTE — Assessment & Plan Note (Signed)

## 2021-03-13 ENCOUNTER — Telehealth: Payer: Self-pay

## 2021-03-13 ENCOUNTER — Other Ambulatory Visit: Payer: Self-pay

## 2021-03-13 ENCOUNTER — Telehealth: Payer: Self-pay | Admitting: *Deleted

## 2021-03-13 DIAGNOSIS — E1159 Type 2 diabetes mellitus with other circulatory complications: Secondary | ICD-10-CM

## 2021-03-13 NOTE — Telephone Encounter (Signed)
FYI --Referral changed to dr Caryl Comes in [place] because [facility] does not accept her insurance

## 2021-03-13 NOTE — Chronic Care Management (AMB) (Signed)
Chronic Care Management   Note  03/13/2021 Name: Stacey Hanson MRN: [id number] DOB: [birth year]  Stacey Hanson is a 76 y.o. year old female who is a primary care patient of Moshe Cipro Norwood Levo, MD. I reached out to [name] by phone today in response to a referral sent by Stacey Hanson's PCP.  Stacey Hanson was given information about Chronic Care Management services today including:  CCM service includes personalized support from designated clinical staff supervised by her physician, including individualized plan of care and coordination with other care providers 24/7 contact phone numbers for assistance for urgent and routine care needs. Service will only be billed when office clinical staff spend 20 minutes or more in a month to coordinate care. Only one practitioner may furnish and bill the service in a calendar month. The patient may stop CCM services at any time (effective at the end of the month) by phone call to the office staff. The patient is responsible for co-pay (up to 20% after annual deductible is met) if co-pay is required by the individual health plan.   Patient agreed to services and verbal consent obtained.   Follow up plan: Face to Face appointment with care management team member scheduled for: 03/26/21  [facility]  Direct Dial: [phone]

## 2021-03-13 NOTE — Telephone Encounter (Signed)
Patient called said she received a phone call from foot doctor in [place] Dr Caprice Beaver, do not accept her insurance.  Need Dr Moshe Cipro send the referral to Dr Lequita Halt in [place].

## 2021-03-17 ENCOUNTER — Encounter: Payer: Self-pay | Admitting: Family Medicine

## 2021-03-17 NOTE — Assessment & Plan Note (Signed)
Stacey Hanson is reminded of the importance of commitment to daily physical activity for 30 minutes or more, as able and the need to limit carbohydrate intake to 30 to 60 grams per meal to help with blood sugar control.   The need to take medication as prescribed, test blood sugar as directed, and to call between visits if there is a concern that blood sugar is uncontrolled is also discussed.   Stacey Hanson is reminded of the importance of daily foot exam, annual eye examination, and good blood sugar, blood pressure and cholesterol control. Uncontrolled , refer for CBG monitoring and follow closely with Endo  Diabetic Labs Latest Ref Rng & Units 12/28/2020 12/19/2020 12/06/2020 08/19/2020 08/12/2020  HbA1c 0.0 - 7.0 % - 9.7(A) - 8.3(A) -  Microalbumin mg/L - - - - -  Micro/Creat Ratio 0 - 29 mg/g creat - - - - -  Chol 100 - 199 mg/dL - - 168 - -  HDL >39 mg/dL - - 53 - -  Calc LDL 0 - 99 mg/dL - - 100(H) - -  Triglycerides 0 - 149 mg/dL - - 79 - -  Creatinine 0.44 - 1.00 mg/dL 1.39(H) - 1.01(H) - 1.21(H)   BP/Weight 03/12/2021 12/29/2020 12/28/2020 12/19/2020 12/19/2020 12/10/2020 7/89/3810  Systolic BP 175 102 - 585 - 277 824  Diastolic BP 70 63 - 61 - 74 89  Wt. (Lbs) 221.04 - 223 220 - 223.08 223  BMI 34.62 - 34.93 34.46 34.94 34.94 34.93   Foot/eye exam completion dates Latest Ref Rng & Units 03/12/2021 08/20/2020  Eye Exam No Retinopathy - No Retinopathy  Foot exam Order - - -  Foot Form Completion - Done -

## 2021-03-17 NOTE — Progress Notes (Signed)
° ° °  Stacey Hanson     MRN: [id number]      DOB: January 23, 1946  HPI: Patient is in for annual physical exam. Recent fracture ,to left great foot , is improving , now walking in a shoe,  but requests in home help with cleaning and transport  assistance to MD visits Blood sugar uncontrolled with marked fluctuations in blood sugar level, referred for CBG monitoring Recent labs,  are reviewed. Immunization is reviewed , and  updated  needed.   PE: BP 130/70    Pulse 92    Ht 5\' 7"  (1.702 m)    Wt 221 lb 0.6 oz (100.3 kg)    SpO2 96%    BMI 34.62 kg/m   Pleasant  female, alert and oriented x 3, in no cardio-pulmonary distress. Afebrile. HEENT No facial trauma or asymetry. Sinuses non tender.  Extra occullar muscles intact.. External ears normal, . Neck: decreased ROM, no adenopathy,JVD or thyromegaly.No bruits.  Chest: Clear to ascultation bilaterally.No crackles or wheezes. Non tender to palpation  Cardiovascular system; Heart sounds normal,  S1 and  S2 ,no S3.  No murmur, or thrill. Apical beat not displaced Peripheral pulses normal.  Abdomen: Soft, non tender, no organomegaly or masses. No bruits. Bowel sounds normal. No guarding, tenderness or rebound.    Musculoskeletal exam: Decreased ROM of spine, hips , shoulders and knees. No deformity ,swelling or crepitus noted. No muscle wasting or atrophy.   Neurologic: Cranial nerves 2 to 12 intact. Power, tone ,sensation and reflexes normal throughout.  disturbance in gait. No tremor.  Skin: Intact, no ulceration, erythema , scaling or rash noted. Pigmentation normal throughout  Psych; Normal mood and affect. Judgement and concentration normal   Assessment & Plan:  Annual physical exam Annual exam as documented. Counseling done  re healthy lifestyle involving commitment to 150 minutes exercise per week, heart healthy diet, and attaining healthy weight.The importance of adequate sleep also discussed. Regular seat belt  use and home safety, is also discussed. Changes in health habits are decided on by the patient with goals and time frames  set for achieving them. Immunization and cancer screening needs are specifically addressed at this visit.

## 2021-03-17 NOTE — Telephone Encounter (Signed)
Sent 03/15/21 to Dr Caryl Comes

## 2021-03-19 ENCOUNTER — Telehealth: Payer: Self-pay

## 2021-03-19 NOTE — Telephone Encounter (Signed)
° °  Telephone encounter was:  Successful.  03/19/2021 Name: Stacey Hanson MRN: [id number] DOB: 1946-01-15  Stacey Hanson is a 76 y.o. year old female who is a primary care patient of Moshe Cipro Norwood Levo, MD . The community resource team was consulted for assistance with  transportation and in home care.  Care guide performed the following interventions: Spoke with patient about [facility] and [facility] transportation and [place].  Patient gave permission to submit [place] referrals to [facility] and [facility] transportation and [place].   Follow Up Plan:  Care guide will follow up with patient by phone over the next 2-50 days  Stacey Hanson, AAS Paralegal, [place] [facility]  [address], [place] [address] ??[email or web address]   ?? [phone]   [email or web address]

## 2021-03-24 ENCOUNTER — Encounter: Payer: Self-pay | Admitting: Nurse Practitioner

## 2021-03-24 ENCOUNTER — Ambulatory Visit: Payer: Medicare Other | Admitting: Nurse Practitioner

## 2021-03-24 ENCOUNTER — Other Ambulatory Visit: Payer: Self-pay

## 2021-03-24 VITALS — BP 126/75 | HR 96 | Ht 67.0 in | Wt 217.2 lb

## 2021-03-24 DIAGNOSIS — E782 Mixed hyperlipidemia: Secondary | ICD-10-CM

## 2021-03-24 DIAGNOSIS — I10 Essential (primary) hypertension: Secondary | ICD-10-CM | POA: Diagnosis not present

## 2021-03-24 DIAGNOSIS — E1165 Type 2 diabetes mellitus with hyperglycemia: Secondary | ICD-10-CM

## 2021-03-24 DIAGNOSIS — E890 Postprocedural hypothyroidism: Secondary | ICD-10-CM

## 2021-03-24 LAB — POCT GLYCOSYLATED HEMOGLOBIN (HGB A1C): HbA1c, POC (controlled diabetic range): 8.2 % — AB (ref 0.0–7.0)

## 2021-03-24 MED ORDER — INSULIN LISPRO PROT & LISPRO (75-25 MIX) 100 UNIT/ML KWIKPEN: 55.0000 [IU] | PEN_INJECTOR | Freq: Two times a day (BID) | SUBCUTANEOUS | 3 refills | Status: DC

## 2021-03-24 NOTE — Patient Instructions (Signed)

## 2021-03-24 NOTE — Progress Notes (Signed)
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    Knee pain    Obesity    Past Surgical History:  Procedure Laterality Date   ABDOMINAL HYSTERECTOMY  03/02/1968   CATARACT EXTRACTION W/PHACO Left 03/09/2019   Procedure: CATARACT EXTRACTION PHACO AND INTRAOCULAR LENS PLACEMENT ([place]);  Surgeon: Baruch Goldmann, MD;  Location: [facility];  Service: Ophthalmology;  Laterality: Left;  CDE: 9.08   CATARACT EXTRACTION W/PHACO Right 03/27/2019   Procedure: CATARACT EXTRACTION PHACO AND INTRAOCULAR LENS PLACEMENT RIGHT EYE;  Surgeon: Baruch Goldmann, MD;  Location: [facility];  Service: Ophthalmology;  Laterality: Right;  CDE: 7.47   CHOLECYSTECTOMY  OCT 2010 BZ BILIARY DYSKINESIA   CHRONIC CHOLECYSTITIS   COLONOSCOPY  MAY 2009 SCREENING   pTC TICS, [facility]   COLONOSCOPY N/A 08/11/2017   Procedure: COLONOSCOPY;  Surgeon: Danie Binder, MD;  Location: [facility];  Service: Endoscopy;  Laterality: N/A;  9:30am   ESOPHAGOGASTRODUODENOSCOPY  10/03/2010   Procedure: ESOPHAGOGASTRODUODENOSCOPY (EGD);  Surgeon: Dorothyann Peng, MD;  Location: [facility];  Service: Endoscopy;  Laterality: N/A;   ESOPHAGOGASTRODUODENOSCOPY  12/19/2002   RMR: Normal esophagus/Normal stomach/ Duodenum large bulbar diverticulum, 1 cm bulbar ulcer with surrounding/ inflammation as described above. Normal D2   FOOT SURGERY Left 01/29/2021   POLYPECTOMY  08/11/2017   Procedure: POLYPECTOMY;  Surgeon: Danie Binder, MD;  Location: [facility];  Service: Endoscopy;;  cecal     SAVORY DILATION  10/03/2010   Procedure: SAVORY DILATION;  Surgeon: Dorothyann Peng, MD;  Location: [facility];  Service: Endoscopy;;   TOTAL ABDOMINAL HYSTERECTOMY  03/02/1968   TOTAL KNEE ARTHROPLASTY Left 12/21/2017   Procedure: LEFT TOTAL KNEE ARTHROPLASTY;  Surgeon: Paralee Cancel, MD;  Location: [facility];  Service: Orthopedics;  Laterality: Left;  70 mins block   UPPER GASTROINTESTINAL ENDOSCOPY  OCT 2004 RMR   DUO TIC, & ULCER   Social History   Socioeconomic History   Marital status: Single    Spouse name: Not on file   Number of children: 3   Years of education: 10   Highest education level: 10th grade  Occupational History   Occupation: [employer]   Occupation: Retired   Tobacco Use   Smoking status: Former    Packs/day: 0.25    Years: 10.00    Pack years: 2.50    Types: Cigarettes    Quit date: 02/20/1985    Years since quitting: 36.1   Smokeless tobacco: Never  Vaping Use   Vaping Use: Never used  Substance and Sexual Activity   Alcohol use: No    Comment: stopped drinking 1998   Drug use: No   Sexual activity: Not Currently  Other Topics Concern   Not on file  Social History Narrative   Lives alone and she retired from [employer] Strain: Medium Risk   Difficulty of Paying Living  Expenses: Somewhat hard  Food Insecurity: No Food Insecurity   Worried About Charity fundraiser in the Last Year: Never true   Ran Out of Food in the Last Year: Never true  Transportation Needs: No Transportation Needs   Lack of Transportation (Medical): No   Lack of Transportation (Non-Medical): No  Physical Activity: Inactive   Days of Exercise per Week: 0 days   Minutes of Exercise per Session: 0 min  Stress: No Stress Concern Present   Feeling of Stress : Only a little  Social Connections: Moderately Isolated   Frequency of Communication with Friends and Family: More than three times a week   Frequency of  Social Gatherings with Friends and Family: More than three times a week   Attends Religious Services: Never   Marine scientist or Organizations: Yes   Attends Archivist Meetings: 1 to 4 times per year   Marital Status: Never married   Outpatient Encounter Medications as of 03/24/2021  Medication Sig   alendronate (FOSAMAX) 70 MG tablet Take 1 tablet (70 mg total) by mouth every 7 (seven) days. Take with a full glass of water on an empty stomach.   amLODipine-benazepril (LOTREL) 5-40 MG capsule Take 1 capsule by mouth daily.   aspirin EC 81 MG tablet Take 81 mg by mouth daily.   B-D UF III MINI PEN NEEDLES 31G X 5 MM MISC USE FOR TWICE DAILY INJECTIONS OF HUMALOG   B-D ULTRAFINE III SHORT PEN 31G X 8 MM MISC USE TO INJECT INSULIN TWICE DAILY   Blood Glucose Monitoring Suppl (ONE TOUCH ULTRA 2) w/Device KIT Twice daily testing dx e11.65   Calcium Carb-Cholecalciferol (CALCIUM 600/VITAMIN D3 PO) Take 2 tablets by mouth daily.   glipiZIDE (GLUCOTROL XL) 5 MG 24 hr tablet TAKE 1 TABLET(5 MG) BY MOUTH DAILY WITH BREAKFAST   glucose blood (ONETOUCH ULTRA) test strip USE TO CHECK BLOOD SUGAR TWICE DAILY.   hydrochlorothiazide (HYDRODIURIL) 25 MG tablet TAKE 1 TABLET(25 MG) BY MOUTH DAILY   HYDROcodone-acetaminophen (NORCO/VICODIN) 5-325 MG tablet Take 1 tablet by mouth every 4 (four) hours as needed for severe pain.   Lancets (ONETOUCH DELICA PLUS [id number]) MISC USE TWICE DAILY TO CHECK BLOOD SUGAR.   levothyroxine (SYNTHROID) 88 MCG tablet TAKE 1 TABLET(88 MCG) BY MOUTH DAILY BEFORE AND BREAKFAST   metFORMIN (GLUCOPHAGE) 500 MG tablet TAKE 1 TABLET(500 MG) BY MOUTH DAILY WITH A MEAL   MODERNA COVID-19 VACCINE 100 MCG/0.5ML injection    Multiple Vitamin (MULTIVITAMIN) capsule Take 1 capsule by mouth daily.   omeprazole (PRILOSEC) 20 MG capsule TAKE 1 CAPSULE BY MOUTH EVERY MORNING   polyethylene glycol (MIRALAX / GLYCOLAX) 17 g packet Take 17 g by mouth as needed.   pravastatin  (PRAVACHOL) 40 MG tablet TAKE 1 TABLET(40 MG) BY MOUTH DAILY   psyllium (METAMUCIL) 58.6 % packet Take 1 packet by mouth as needed.   UNABLE TO FIND Diabetic shoes x 1 pair, Inserts x 3 pair DX E11.9   [DISCONTINUED] Insulin Lispro Prot & Lispro (HUMALOG MIX 75/25 KWIKPEN) (75-25) 100 UNIT/ML Kwikpen Inject 50 Units into the skin 2 (two) times daily with a meal.   Insulin Lispro Prot & Lispro (HUMALOG MIX 75/25 KWIKPEN) (75-25) 100 UNIT/ML Kwikpen Inject 55 Units into the skin 2 (two) times daily with a meal.   No facility-administered encounter medications on file as of 03/24/2021.   ALLERGIES: Allergies  Allergen Reactions   Simvastatin Other (See Comments)    Muscle cramps  VACCINATION STATUS: Immunization History  Administered Date(s) Administered   Fluad Quad(high Dose 65+) 11/23/2018, 12/21/2019, 12/10/2020   Influenza,inj,Quad PF,6+ Mos 11/16/2012, 11/15/2013, 11/14/2014, 11/14/2015, 11/03/2016, 11/09/2017   Influenza,inj,quad, With Preservative 11/02/2016   Moderna SARS-COV2 Booster Vaccination 07/24/2020   Moderna Sars-Covid-2 Vaccination 04/27/2019, 05/26/2019, 01/08/2020   Pneumococcal Conjugate-13 10/11/2013   Pneumococcal Polysaccharide-23 10/07/2011   Tdap 12/16/2020   Zoster Recombinat (Shingrix) 12/16/2020   Zoster, Live 04/05/2006    Hypertension This is a chronic problem. The current episode started more than 1 year ago. The problem has been gradually improving since onset. The problem is controlled. Pertinent negatives include no blurred vision, headaches, palpitations, shortness of breath or sweats. Agents associated with hypertension include thyroid hormones. Risk factors for coronary artery disease include diabetes mellitus, dyslipidemia, obesity, sedentary lifestyle and smoking/tobacco exposure. Past treatments include ACE inhibitors, diuretics and calcium channel blockers. The current treatment provides mild improvement. There are no compliance problems.   Hypertensive end-organ damage includes kidney disease. Identifiable causes of hypertension include chronic renal disease and a thyroid problem.  Diabetes She presents for her follow-up diabetic visit. She has type 2 diabetes mellitus. Onset time: She was diagnosed at approximate age of 76 years. Her disease course has been improving. Pertinent negatives for hypoglycemia include no confusion, headaches, nervousness/anxiousness, sweats or tremors. Pertinent negatives for diabetes include no blurred vision, no fatigue, no foot paresthesias, no polydipsia, no polyphagia, no polyuria and no weight loss. There are no hypoglycemic complications. Symptoms are stable. Diabetic complications include nephropathy. Risk factors for coronary artery disease include dyslipidemia, diabetes mellitus, obesity, hypertension, sedentary lifestyle, post-menopausal and tobacco exposure. Current diabetic treatment includes insulin injections and oral agent (dual therapy). She is compliant with treatment most of the time. Her weight is decreasing steadily. She is following a generally unhealthy diet. When asked about meal planning, she reported none. She has not had a previous visit with a dietitian. She rarely participates in exercise. Her breakfast blood glucose range is generally 140-180 mg/dl. Her dinner blood glucose range is generally >200 mg/dl. (She presents today with her meter and logs showing slightly above target fasting and postprandial glycemic profile.  Her POCT A1c today is 8.2% improving from last visit of 9.7%.  She is healing from a broken foot, still in ortho shoe but it is limiting her ability to exercise optimally.  She denies any hypoglycemia.  Analysis of her meter shows 7-day average of 208, 14-day average of 201, 30-day average of 183.) An ACE inhibitor/angiotensin II receptor blocker is being taken. She does not see a podiatrist.Eye exam is current.  Thyroid Problem Presents for follow-up (-She is also status  post radioactive iodine treatment for toxic nodule on October 17, 2010.  She has had  history of multinodular goiter status post fine-needle aspiration with benign findings.) visit. Patient reports no anxiety, cold intolerance, constipation, depressed mood, diarrhea, fatigue, heat intolerance, leg swelling, palpitations, tremors, weight gain or weight loss. The symptoms have been stable.    Review of systems  Constitutional: + steadily decreasing body weight,  current Body mass index is 34.02 kg/m. , no fatigue, no subjective hyperthermia, no subjective hypothermia Eyes: no blurry vision, no xerophthalmia ENT: no sore throat, no nodules palpated in throat, no dysphagia/odynophagia, no hoarseness Cardiovascular: no chest pain, no shortness of breath, no palpitations, no leg swelling Respiratory: no cough, no shortness of breath Gastrointestinal: no nausea/vomiting/diarrhea Musculoskeletal: in left ortho shoe-recent break- walks with cane Skin: no rashes, no hyperemia Neurological: no tremors, no numbness,  no tingling, no dizziness Psychiatric: no depression, no anxiety    Objective:    BP 126/75    Pulse 96    Ht _0  (1.702 m)    Wt 217 lb 3.2 oz (98.5 kg)    SpO2 99%    BMI 34.02 kg/m   Wt Readings from Last 3 Encounters:  03/24/21 217 lb 3.2 oz (98.5 kg)  03/12/21 221 lb 0.6 oz (100.3 kg)  12/28/20 223 lb (101.2 kg)    BP Readings from Last 3 Encounters:  03/24/21 126/75  03/12/21 130/70  12/29/20 113/63     Physical Exam- Limited  Constitutional:  Body mass index is 34.02 kg/m. , not in acute distress, normal state of mind Eyes:  EOMI, no exophthalmos Neck: Supple Cardiovascular: RRR, no murmurs, rubs, or gallops, no edema Respiratory: Adequate breathing efforts, no crackles, rales, rhonchi, or wheezing Musculoskeletal: in left ortho shoe-recent break- walks with cane Skin:  no rashes, no hyperemia Neurological: no tremor with outstretched hands    CMP      Component Value Date/Time   NA 132 (L) 12/28/2020 2201   NA 133 (L) 12/06/2020 0956   K 3.6 12/28/2020 2201   CL 96 (L) 12/28/2020 2201   CO2 24 12/28/2020 2201   GLUCOSE 395 (H) 12/28/2020 2201   BUN 18 12/28/2020 2201   BUN 18 12/06/2020 0956   CREATININE 1.39 (H) 12/28/2020 2201   CREATININE 0.96 (H) 03/12/2016 0925   CALCIUM 8.8 (L) 12/28/2020 2201   PROT 7.7 12/28/2020 2201   PROT 7.6 12/06/2020 0956   ALBUMIN 4.0 12/28/2020 2201   ALBUMIN 4.6 12/06/2020 0956   AST 23 12/28/2020 2201   ALT 18 12/28/2020 2201   ALKPHOS 99 12/28/2020 2201   BILITOT 0.6 12/28/2020 2201   BILITOT 0.3 12/06/2020 0956   GFRNONAA 40 (L) 12/28/2020 2201   GFRNONAA 60 03/12/2016 0925   GFRAA 48 (L) 04/11/2020 1007   GFRAA 69 03/12/2016 0925     Diabetic Labs (most recent): Lab Results  Component Value Date   HGBA1C 8.2 (A) 03/24/2021   HGBA1C 9.7 (A) 12/19/2020   HGBA1C 8.3 (A) 08/19/2020     Lipid Panel ( most recent) Lipid Panel     Component Value Date/Time   CHOL 168 12/06/2020 0957   TRIG 79 12/06/2020 0957   HDL 53 12/06/2020 0957   CHOLHDL 3.2 12/06/2020 0957   CHOLHDL 2.2 01/11/2016 0916   VLDL 11 01/11/2016 0916   LDLCALC 100 (H) 12/06/2020 0957    Assessment & Plan:   1) Uncontrolled type 2 diabetes mellitus with stage 3 chronic kidney disease, with long-term current use of insulin (HCC)  -She will continue to need insulin treatment in order for her to achieve control of diabetes to target.    She presents today with her meter and logs showing slightly above target fasting and postprandial glycemic profile.  Her POCT A1c today is 8.2% improving from last visit of 9.7%.  She is healing from a broken foot, still in ortho shoe but it is limiting her ability to exercise optimally.  She denies any hypoglycemia.  Analysis of her meter shows 7-day average of 208, 14-day average of 201, 30-day average of 183.  - Nutritional counseling repeated at each appointment due to  patients tendency to fall back in to old habits.  - The patient admits there is a room for improvement in their diet and drink choices. -  Suggestion is made for the patient to avoid  simple carbohydrates from their diet including Cakes, Sweet Desserts / Pastries, Ice Cream, Soda (diet and regular), Sweet Tea, Candies, Chips, Cookies, Sweet Pastries, Store Bought Juices, Alcohol in Excess of 1-2 drinks a day, Artificial Sweeteners, Coffee Creamer, and "Sugar-free" Products. This will help patient to have stable blood glucose profile and potentially avoid unintended weight gain.   - I encouraged the patient to switch to unprocessed or minimally processed complex starch and increased protein intake (animal or plant source), fruits, and vegetables.   - Patient is advised to stick to a routine mealtimes to eat 3 meals a day and avoid unnecessary snacks (to snack only to correct hypoglycemia).  -Based on her above target glucose readings, she is advised to increase her Humalog 75/25 to 55 units with breakfast and supper if glucose is above 90 and she is eating.  She can also continue her Metformin 500 mg po once daily with breakfast (renal function stable) and Glipizide 5 mg XL daily with breakfast.    -She is advised to continue monitoring blood glucose at least 3 times daily, before injecting insulin at breakfast and supper, and before bed.  She is to contact the clinic if she has readings less than 70 or greater than 200 for 3 tests in a row.  She would be an excellent candidate for CGM device.  I discussed and ordered Dexcom through [employer] for her today.  She also has appointment with clinical pharmacist coming up.  She would be an ideal candidate for GLP1 such as Ozempic but cost is a major factor for her.  2) Hypothyroidism- s/p RAI -There are no recent TFTs to review.   She is advised to continue Levothyroxine 88 mcg po daily before breakfast. Will recheck TFTs prior to next visit and adjust dose  if needed.   - We discussed about the correct intake of her thyroid hormone, on empty stomach at fasting, with water, separated by at least 30 minutes from breakfast and other medications,  and separated by more than 4 hours from calcium, iron, multivitamins, acid reflux medications (PPIs). -Patient is made aware of the fact that thyroid hormone replacement is needed for life, dose to be adjusted by periodic monitoring of thyroid function tests.  3) Weight management:  Her Body mass index is 34.02 kg/m.-a candidate for modest weight loss.  Patient specific carbs restrictions and exercise regimen discussed with her.  4) Blood pressure/ Hypertension: -Her blood pressure is controlled to target.  She is advised to continue Amlodipine Benazepril 5-40 mg po daily and HCTZ 25 mg po daily.    5) Hyperlipidemia: Her recent lipid panel from 12/06/20 showed uncontrolled LDL of 100.  She is advised to continue Pravastatin 40 mg po daily at bedtime.  Side effects and precautions discussed with her.  - I advised patient to maintain close follow up with Fayrene Helper, MD for diabetes and all other primary care needs.       I spent 30 minutes in the care of the patient today including review of labs from [place], Lipids, Thyroid Function, Hematology (current and previous including abstractions from other facilities); face-to-face time discussing  her blood glucose readings/logs, discussing hypoglycemia and hyperglycemia episodes and symptoms, medications doses, her options of short and long term treatment based on the latest standards of care / guidelines;  discussion about incorporating lifestyle medicine;  and documenting the encounter.    Please refer to Patient Instructions for Blood Glucose Monitoring and Insulin/Medications Dosing Guide" in media tab  for additional information. Please  also refer to " Patient Self Inventory" in the Media  tab for reviewed elements of pertinent patient  history.  Stacey Hanson participated in the discussions, expressed understanding, and voiced agreement with the above plans.  All questions were answered to her satisfaction. she is encouraged to contact clinic should she have any questions or concerns prior to her return visit.    Follow up plan: Return in about 3 months (around 06/22/2021) for Diabetes F/U- A1c and UM in office, Thyroid follow up, Previsit labs, Bring meter and logs.   Rayetta Pigg, [facility] [address] [place], [place] [address] Phone: [phone] Fax: [phone]  03/24/2021, 1:48 PM

## 2021-03-25 ENCOUNTER — Telehealth: Payer: Self-pay

## 2021-03-25 NOTE — Telephone Encounter (Signed)
° °  Telephone encounter was:  Successful.  03/25/2021 Name: Stacey Hanson MRN: [id number] DOB: [birth year]  Stacey Hanson is a 76 y.o. year old female who is a primary care patient of Moshe Cipro Norwood Levo, MD . The community resource team was consulted for assistance with Transportation Needs  and in-home care.  Care guide performed the following interventions: [id number] message received from [place] spoke with [name], she will check with her current insurance to see if she has IHC services in her policy and she will contact me back.  In which I will assist as needed, and will move forward with possibility putting her on the Home and [place].  Also [name] was transferred to our transportation department for further information and assistance.   Follow Up Plan:  No further follow up planned at this time. The patient has been provided with needed resources.  Zamorah Ailes, AAS Paralegal, [facility] [facility]  [address], [place] [address] ??[email or web address]   ?? [phone]   [email or web address]

## 2021-03-26 ENCOUNTER — Other Ambulatory Visit: Payer: Self-pay

## 2021-03-26 ENCOUNTER — Telehealth: Payer: Self-pay | Admitting: Nurse Practitioner

## 2021-03-26 ENCOUNTER — Ambulatory Visit (INDEPENDENT_AMBULATORY_CARE_PROVIDER_SITE_OTHER): Payer: Medicare Other | Admitting: Pharmacist

## 2021-03-26 ENCOUNTER — Telehealth: Payer: Medicare Other

## 2021-03-26 DIAGNOSIS — E782 Mixed hyperlipidemia: Secondary | ICD-10-CM

## 2021-03-26 DIAGNOSIS — I10 Essential (primary) hypertension: Secondary | ICD-10-CM

## 2021-03-26 DIAGNOSIS — E1159 Type 2 diabetes mellitus with other circulatory complications: Secondary | ICD-10-CM

## 2021-03-26 DIAGNOSIS — E6609 Other obesity due to excess calories: Secondary | ICD-10-CM

## 2021-03-26 MED ORDER — FREESTYLE LIBRE 2 SENSOR MISC
1.0000 | 1 refills | Status: DC
Start: 2021-03-26 — End: 2021-03-31

## 2021-03-26 MED ORDER — OZEMPIC (0.25 OR 0.5 MG/DOSE) 2 MG/1.5ML ~~LOC~~ SOPN: 0.5000 mg | PEN_INJECTOR | SUBCUTANEOUS | 3 refills | Status: DC

## 2021-03-26 MED ORDER — TRESIBA FLEXTOUCH 100 UNIT/ML ~~LOC~~ SOPN: 50.0000 [IU] | PEN_INJECTOR | Freq: Every day | SUBCUTANEOUS | 3 refills | Status: DC

## 2021-03-26 MED ORDER — FREESTYLE LIBRE 2 READER DEVI: 0 refills | Status: DC

## 2021-03-26 NOTE — Patient Instructions (Addendum)
Stacey Hanson,  It was great to talk to you today!  Please call me with any questions or concerns.  Visit Information   Following is a copy of your full care plan:  Care Plan : Medication Management  Updates made by Beryle Lathe, [place] since 03/26/2021 12:00 AM     Problem: T2DM, HTN, HLD, Obesity   Priority: High  Onset Date: 03/26/2021     Long-Range Goal: Disease Progression Prevention   Start Date: 03/26/2021  Expected End Date: 06/24/2021  This Visit's Progress: On track  Priority: High  Note:   Current Barriers:  Unable to independently afford treatment regimen Unable to independently monitor therapeutic efficacy Unable to achieve control of diabetes, hyperlipidemia, and weight management Suboptimal therapeutic regimen for diabetes and hyperlipidemia  Pharmacist Clinical Goal(s):  Through collaboration with PharmD and provider, patient will  Verbalize ability to afford treatment regimen Achieve adherence to monitoring guidelines and medication adherence to achieve therapeutic efficacy Achieve control of diabetes, hyperlipidemia, and weight management as evidenced by improved fasting blood sugar, improved A1c, improved LDL, and continued weight loss Adhere to plan to optimize therapeutic regimen for diabetes as evidenced by report of adherence to recommended medication management changes   Interventions: 1:1 collaboration with Fayrene Helper, MD regarding development and update of comprehensive plan of care as evidenced by provider attestation and co-signature Inter-disciplinary care team collaboration (see longitudinal plan of care) Comprehensive medication review performed; medication list updated in electronic medical record  Type 2 Diabetes - New goal.: Follows with endocrinology Rayetta Pigg, NP) Uncontrolled; Most recent A1c improved 9.7%>8.2% but remains above goal of <7% per ADA guidelines Current medications: metformin 500 mg by mouth once  daily, glipizide XL 5 mg by mouth once daily, and Humalog Mix 75/25 55 units subcutaneously twice daily Recent changes: endocrinology recently increased Humalog Mix 75/25 from 50 units twice daily to 55 units twice daily Intolerances: none Taking medications as directed: yes Side effects thought to be attributed to current medication regimen: no Denies hypoglycemic symptoms (sweaty and shaky). Hypoglycemia prevention: not indicated at this time Current meal patterns: breakfast: cereal and fruit; lunch: sandwich; dinner: baked/grilled chicken, Fried chicken, potatoes, and vegetables; snacks: chips and nut, popcorn, pretzels, cheez-its; beverages: water, regular soda, sweet tea, and milk; Current exercise: not active due to recent foot surgery On a statin: yes On aspirin 81 mg daily: yes Last microalbumin/creatinine ratio: <7 (06/23/20); on an ACEi/ARB: yes Last eye exam: completed within last year Last foot exam: completed within last year Pneumonia vaccine: series complete Influenza vaccine: up to date Shingrix: dose 1 or 2 completed October 2022. Now due for dose 2.  Current glucose readings:  patient did not bring blood glucose meter or log today The following education was provided: Healthy eating: plate method The following educational handouts were provided: Meal planning and carb counting, My carbohydrate guide, Dining out with diabetes, Planning healthy meals, and GLP-1 is an essential noninsulin hormone Patient identified as a good candidate for a GLP-1 receptor agonist given reduction in cardiovascular disease, slowed chronic kidney disease progression, low risk of hypoglycemia, increased satiety, and weight loss. Patient denies a personal or family history of medullary thyroid carcinoma (MTC) or Multiple Endocrine Neoplasia syndrome type 2 (MEN 2). Patient also denies any history of pancreatitis and she has had her gallbladder removed.  Discussed with primary care provider and  endocrinology and will execute the following plan (endocrinology to send in prescriptions): Continue metformin 500 mg by mouth once daily. Due  to current renal function, unable to increase. Will need to discontinue if glomerular filtration rate <30 mL/min Add semaglutide (Ozempic) 0.25 mg subcutaneously weekly x2 weeks then increase to 0.5 mg subcutaneously weekly  Add insulin degludec Tyler Aas) 50 units subcutaneously once daily Discontinue glipizide XL Discontinue Humalog Mix 75/25  Instructed to monitor blood sugars twice a day at the following times: fasting (at least 8 hours since last food consumption), 5-15 minutes before dinner, and whenever patient experiences symptoms of hypo/hyperglycemia  Endocrinology is currently in the process of getting patient approved for a continuous glucose monitor through [employer]. Will continue to follow.  Discussed management of hypoglycemia. If blood sugar <70 at any time, treat with simple sugar such as 1/2 cup juice or regular soda or 3-4 glucose tablets. Recheck blood sugar in 15 minutes and repeat if blood sugar remains <70.  Continue follow-up with endocrinology Patient was shown appropriate injection technique for Ozempic today in clinic   Hypertension - Condition stable. Not addressed this visit.: Blood pressure under good control. Blood pressure is at goal of <130/80 mmHg per 2017 AHA/ACC guidelines. Current medications: hydrochlorothiazide 25 mg by mouth once daily and amlodipine-benazepril 5-20m by mouth once daily Intolerances: none Taking medications as directed: yes Side effects thought to be attributed to current medication regimen: no Denies dizziness and lightheadedness. Current home blood pressure: patient does not currently check Continue current medications as above Encourage dietary sodium restriction/DASH diet Recommend home blood pressure monitoring to discuss at next visit Discussed need for and importance of continued work on  weight loss Discussed need for medication compliance  Hyperlipidemia - New goal.: Uncontrolled. LDL above goal of <70 due to very high risk given 10-year risk >20% per 2020 AACE/ACE guidelines. Triglycerides at goal of <150 per 2020 AACE/ACE guidelines. Current medications: pravastatin 40 mg by mouth once daily Intolerances:  simvastatin (muscle cramps)  Taking medications as directed: yes Side effects thought to be attributed to current medication regimen: no Continue pravastatin 40 mg by mouth once daily for now given significant change to diabetes regimen. Do not want to cause confusion but will address elevated LDL at future visits. Will need to likely increase to high intensity statin.  Encourage dietary reduction of high fat containing foods such as butter, nuts, bacon, egg yolks, etc. Discussed need for and importance of continued work on weight loss Discussed need for medication compliance  Overweight/Obesity - New goal.: Unable to achieve goal weight loss through lifestyle modification alone Current treatment:  none   Medications potentially contributing to weight gain: insulin and sulfonylurea Medications previously tried:  none Strategies previously tried: none History of bariatric surgery: none Baseline weight: 229 lbs; most recent weight: 221 lbs Patient has a weight scale at home and uses periodically Recommend that the patient emphasize lean proteins, fruits and vegetables, whole grains and increased fiber consumption, adequate hydration Add semaglutide (Ozempic) 0.25 mg subcutaneously weekly x2 weeks then increase to 0.5 mg subcutaneously weekly Discontinue glipizide  Patient Goals/Self-Care Activities Patient will:  Focus on medication adherence by keeping up with prescription refills and either using a pill box or reminders to take your medications at the prescribed times Check blood sugar twice a day at the following times: fasting (at least 8 hours since last food  consumption), 5-15 minutes before dinner, and whenever patient experiences symptoms of hypo/hyperglycemia, document, and provide at future appointments Collaborate with provider on medication access solutions Engage in dietary modifications by fewer sweetened foods & beverages  Follow Up Plan: Telephone  follow up appointment with care management team member scheduled for: 04/01/21      Consent to CCM Services: Ms. Cunliffe was given information about Chronic Care Management services including:  CCM service includes personalized support from designated clinical staff supervised by her physician, including individualized plan of care and coordination with other care providers 24/7 contact phone numbers for assistance for urgent and routine care needs. Service will only be billed when office clinical staff spend 20 minutes or more in a month to coordinate care. Only one practitioner may furnish and bill the service in a calendar month. The patient may stop CCM services at any time (effective at the end of the month) by phone call to the office staff. The patient will be responsible for cost sharing (co-pay) of up to 20% of the service fee (after annual deductible is met).  Patient agreed to services and verbal consent obtained.   Plan: Telephone follow up appointment with care management team member scheduled for:  04/01/21  The patient verbalized understanding of instructions, educational materials, and care plan provided today and agreed to receive a mailed copy of patient instructions, educational materials, and care plan.   Please call the care guide team at [phone] if you need to cancel or reschedule your appointment.   Kennon Holter, PharmD, BCACP, CPP Clinical Pharmacist Practitioner [facility] [phone]   Ozempic (semaglutide)   What is this medicine used for: Used to lower your blood sugar and treat your diabetes.  How to take the medicine:  With  each use of the pen: Take your pen out of the refrigerator and let it come to room temperature (about 15 minutes). Check the pen window to make sure that Ozempic in your pen is clear and colorless (if the medication looks cloudy, do not use the pen) Pull off pen cap and wipe rubber stopper tip with alcohol swab Twist on a new needle and pull off outer cap, do not throw this away Pull off inner needle cap and throw it away Before the first time you use a new pen (priming the pen): Turn the dose selector until the dose counter shows the "flow check symbol" Hold pen upright and press the dose button until the dose counter shows 0 Repeat until a drop of the medicine appears at the tip of the needle Using the pen and injecting your dose: Dial your dose using the dose selector Choose your injection site (stomach, thigh, or upper arm) and wipe the skin with an alcohol swab. Let dry before injecting your dose Insert the needle into the skin at your injection site. Make sure you can see the dose counter, do not cover with your hand Press down on the dose button to inject until the dose counter shows 0 and lines up with the dose pointer. You may hear or feel a click Keep the needle in your skin and count slowly for 6 seconds Withdraw the needle from your skin and carefully cap the needle using the outer needle cap. Unscrew the needle from the pen and place the needle in a puncture-resistant container Rotate injection sites weekly  Most common side effects: Nausea/Vomiting Diarrhea Abdominal pain  Storage: How Supplied: Each carton contains 1-2 pens  Each pen contains 2-4 doses Store new, unopened pens in the refrigerator. Do not freeze. Store your used pen at room temperature or in the refrigerator for 56 days. Pens should be thrown away after 56 days, even if they still have medication remaining.

## 2021-03-26 NOTE — Chronic Care Management (AMB) (Signed)
Chronic Care Management Pharmacy Note  03/26/2021 Name:  Stacey Hanson MRN:  [id number] DOB:  [birth year]  Summary: Type 2 Diabetes Follows with endocrinology Rayetta Pigg, NP) Uncontrolled; Most recent A1c improved 9.7%>8.2% but remains above goal of <7% per ADA guidelines Recent changes: endocrinology recently increased Humalog Mix 75/25 from 50 units twice daily to 55 units twice daily Discussed with primary care provider and endocrinology and will execute the following plan (endocrinology to send in prescriptions): Continue metformin 500 mg by mouth once daily. Due to current renal function, unable to increase. Will need to discontinue if glomerular filtration rate <30 mL/min Add semaglutide (Ozempic) 0.25 mg subcutaneously weekly x2 weeks then increase to 0.5 mg subcutaneously weekly  Add insulin degludec Tyler Aas) 50 units subcutaneously once daily Discontinue glipizide XL Discontinue Humalog Mix 75/25  Endocrinology is currently in the process of getting patient approved for a continuous glucose monitor through [employer]. Will continue to follow.   Hyperlipidemia Uncontrolled. LDL above goal of <70 due to very high risk given 10-year risk >20% per 2020 AACE/ACE guidelines. Continue pravastatin 40 mg by mouth once daily for now given significant change to diabetes regimen. Do not want to cause confusion but will address elevated LDL at future visits. Will need to likely increase to high intensity statin.   Overweight/Obesity Medications potentially contributing to weight gain: insulin and sulfonylurea Baseline weight: 229 lbs; most recent weight: 221 lbs Add semaglutide (Ozempic) 0.25 mg subcutaneously weekly x2 weeks then increase to 0.5 mg subcutaneously weekly Discontinue glipizide  Subjective: Stacey Hanson is an 76 y.o. year old female who is a primary patient of Fayrene Helper, MD.  The CCM team was consulted for assistance with disease management and care  coordination needs.    Engaged with patient face to face for initial visit in response to provider referral for pharmacy case management and/or care coordination services.   Consent to Services:  The patient was given the following information about Chronic Care Management services today, agreed to services, and gave verbal consent: 1. CCM service includes personalized support from designated clinical staff supervised by the primary care provider, including individualized plan of care and coordination with other care providers 2. 24/7 contact phone numbers for assistance for urgent and routine care needs. 3. Service will only be billed when office clinical staff spend 20 minutes or more in a month to coordinate care. 4. Only one practitioner may furnish and bill the service in a calendar month. 5.The patient may stop CCM services at any time (effective at the end of the month) by phone call to the office staff. 6. The patient will be responsible for cost sharing (co-pay) of up to 20% of the service fee (after annual deductible is met). Patient agreed to services and consent obtained.  Patient Care Team: Fayrene Helper, MD as PCP - General [place] Lenise Herald, MD (Inactive) as PCP - Cardiology (Cardiology) Danie Binder, MD (Inactive) (Gastroenterology) Leta Baptist, MD as Attending Physician (Otolaryngology) Cassandria Anger, MD (Endocrinology) Carole Civil, MD as Consulting Physician (Orthopedic Surgery) Madelin Headings, DO (Optometry) Bernell List, CPhT as Joice Management (Pharmacy Technician) Eloise Harman, DO as Consulting Physician (Internal Medicine) Beryle Lathe, [facility] (Pharmacist)  Objective:  Lab Results  Component Value Date   CREATININE 1.39 (H) 12/28/2020   CREATININE 1.01 (H) 12/06/2020   CREATININE 1.21 (H) 08/12/2020    Lab Results  Component Value Date   HGBA1C 8.2 (A) 03/24/2021  Last diabetic Eye exam:  Lab  Results  Component Value Date/Time   HMDIABEYEEXA No Retinopathy 08/20/2020 12:00 AM    Last diabetic Foot exam:  Lab Results  Component Value Date/Time   HMDIABFOOTEX yes 03/18/2010 12:00 AM        Component Value Date/Time   CHOL 168 12/06/2020 0957   TRIG 79 12/06/2020 0957   HDL 53 12/06/2020 0957   CHOLHDL 3.2 12/06/2020 0957   CHOLHDL 2.2 01/11/2016 0916   VLDL 11 01/11/2016 0916   LDLCALC 100 (H) 12/06/2020 0957    Hepatic Function Latest Ref Rng & Units 12/28/2020 12/06/2020 08/12/2020  Total Protein 6.5 - 8.1 g/dL 7.7 7.6 7.9  Albumin 3.5 - 5.0 g/dL 4.0 4.6 4.7  AST 15 - 41 U/L '23 20 21  ' ALT 0 - 44 U/L '18 17 21  ' Alk Phosphatase 38 - 126 U/L 99 141(H) 122(H)  Total Bilirubin 0.3 - 1.2 mg/dL 0.6 0.3 0.4  Bilirubin, Direct 0.0 - 0.3 mg/dL - - -    Lab Results  Component Value Date/Time   TSH 0.328 (L) 12/06/2020 09:56 AM   TSH 0.307 (L) 08/12/2020 09:43 AM   FREET4 1.54 12/06/2020 09:56 AM   FREET4 1.42 08/12/2020 09:43 AM    CBC Latest Ref Rng & Units 12/28/2020 08/08/2018 02/09/2018  WBC 4.0 - 10.5 K/uL 10.3 7.0 8.7  Hemoglobin 12.0 - 15.0 g/dL 12.0 13.0 12.1  Hematocrit 36.0 - 46.0 % 36.2 37.5 36.9  Platelets 150 - 400 K/uL 403(H) 451(H) 548(H)    Lab Results  Component Value Date/Time   VD25OH 37.3 12/06/2020 09:57 AM   VD25OH 50.5 11/06/2017 09:47 AM    Clinical ASCVD: No  The 10-year ASCVD risk score (Arnett DK, et al., 2019) is: 27.8%   Values used to calculate the score:     Age: 32 years     Sex: Female     Is Non-Hispanic African American: Yes     Diabetic: Yes     Tobacco smoker: No     Systolic Blood Pressure: 947 mmHg     Is BP treated: Yes     HDL Cholesterol: 53 mg/dL     Total Cholesterol: 168 mg/dL   Social History   Tobacco Use  Smoking Status Former   Packs/day: 0.25   Years: 10.00   Pack years: 2.50   Types: Cigarettes   Quit date: 02/20/1985   Years since quitting: 36.1  Smokeless Tobacco Never   BP Readings from Last  3 Encounters:  03/24/21 126/75  03/12/21 130/70  12/29/20 113/63   Pulse Readings from Last 3 Encounters:  03/24/21 96  03/12/21 92  12/29/20 95   Wt Readings from Last 3 Encounters:  03/24/21 217 lb 3.2 oz (98.5 kg)  03/12/21 221 lb 0.6 oz (100.3 kg)  12/28/20 223 lb (101.2 kg)    Assessment: Review of patient past medical history, allergies, medications, health status, including review of consultants reports, laboratory and other test data, was performed as part of comprehensive evaluation and provision of chronic care management services.   SDOH:  (Social Determinants of Health) assessments and interventions performed:  SDOH Interventions    Flowsheet Row Most Recent Value  SDOH Interventions   Financial Strain Interventions Other (Comment)  [Confirmed patient has Medicare Extra Help]       CCM Care Plan  Allergies  Allergen Reactions   Simvastatin Other (See Comments)    Muscle cramps    Medications Reviewed Today  Reviewed by Beryle Lathe, [place] (Pharmacist) on 03/26/21 at 1045  Med List Status: <None>   Medication Order Taking? Sig Documenting Provider Last Dose Status Informant  alendronate (FOSAMAX) 70 MG tablet [id number] Yes Take 1 tablet (70 mg total) by mouth every 7 (seven) days. Take with a full glass of water on an empty stomach. Fayrene Helper, MD Taking Active   amLODipine-benazepril (LOTREL) 5-40 MG capsule [id number] Yes Take 1 capsule by mouth daily. Fayrene Helper, MD Taking Active   aspirin EC 81 MG tablet [id number] Yes Take 81 mg by mouth daily. [provider] Taking Active Self  B-D UF III MINI PEN NEEDLES 31G X 5 MM MISC [id number] Yes USE FOR TWICE DAILY INJECTIONS OF HUMALOG Fayrene Helper, MD Taking Active Self  B-D ULTRAFINE III SHORT PEN 31G X 8 MM MISC [id number] Yes USE TO INJECT INSULIN TWICE DAILY Fayrene Helper, MD Taking Active   Blood Glucose Monitoring Suppl (ONE TOUCH ULTRA 2) w/Device Drucie Opitz  [id number] Yes Twice daily testing dx e11.65 Fayrene Helper, MD Taking Active Self  Calcium Carb-Cholecalciferol (CALCIUM 600/VITAMIN D3 PO) [id number] Yes Take 2 tablets by mouth daily. [provider] Taking Active Self  glipiZIDE (GLUCOTROL XL) 5 MG 24 hr tablet [id number] Yes TAKE 1 TABLET(5 MG) BY MOUTH DAILY WITH BREAKFAST Brita Romp, NP Taking Active   glucose blood (ONETOUCH ULTRA) test strip [id number]  USE TO CHECK BLOOD SUGAR TWICE DAILY. Fayrene Helper, MD  Active   hydrochlorothiazide (HYDRODIURIL) 25 MG tablet [id number] Yes TAKE 1 TABLET(25 MG) BY MOUTH DAILY Fayrene Helper, MD Taking Active   HYDROcodone-acetaminophen (NORCO/VICODIN) 5-325 MG tablet [id number] No Take 1 tablet by mouth every 4 (four) hours as needed for severe pain.  Patient not taking: Reported on 03/26/2021   Edrick Kins, DPM Not Taking Active   Insulin Lispro Prot & Lispro (HUMALOG MIX 75/25 KWIKPEN) (75-25) 100 UNIT/ML Claiborne Rigg [id number] Yes Inject 55 Units into the skin 2 (two) times daily with a meal. Brita Romp, NP Taking Active   Lancets (ONETOUCH DELICA PLUS [id number]) [place] [id number]  USE TWICE DAILY TO CHECK BLOOD SUGAR. Fayrene Helper, MD  Active   levothyroxine (SYNTHROID) 88 MCG tablet [id number] Yes TAKE 1 TABLET(88 MCG) BY MOUTH DAILY BEFORE AND BREAKFAST Brita Romp, NP Taking Active   metFORMIN (GLUCOPHAGE) 500 MG tablet [id number] Yes TAKE 1 TABLET(500 MG) BY MOUTH DAILY WITH A MEAL Nida, Marella Chimes, MD Taking Active   MODERNA COVID-19 VACCINE 100 MCG/0.5ML injection [id number]   [provider]  Active   Multiple Vitamin (MULTIVITAMIN) capsule [id number] Yes Take 1 capsule by mouth daily. [provider] Taking Active Self  omeprazole (PRILOSEC) 20 MG capsule [id number] Yes TAKE 1 CAPSULE BY MOUTH EVERY MORNING Mahala Menghini, PA-C Taking Active   polyethylene glycol (MIRALAX / GLYCOLAX) 17 g packet [id number] Yes Take 17 g by mouth as  needed. [provider] Taking Active   pravastatin (PRAVACHOL) 40 MG tablet [id number] Yes TAKE 1 TABLET(40 MG) BY MOUTH DAILY Fayrene Helper, MD Taking Active   psyllium (METAMUCIL) 58.6 % packet [id number] Yes Take 1 packet by mouth as needed. [provider] Taking Active   UNABLE TO FIND [id number]  Diabetic shoes x 1 pair, Inserts x 3 pair DX E11.9 Fayrene Helper, MD  Active             Patient Active Problem List   Diagnosis Date Noted   Chronic  hip pain, left 06/26/2020   Type 2 diabetes mellitus with vascular disease ([place]) 05/29/2019   S/P total knee replacement 12/21/2017   Pain in right knee 07/14/2017   Back pain 04/30/2015   Backache 04/30/2015   Seasonal allergies 02/21/2015   Annual physical exam 09/27/2014   Osteopenia 06/20/2013   Hypothyroidism following radioiodine therapy 12/28/2010   Pure hypercholesterolemia 09/25/2010   Gastroesophageal reflux disease 09/22/2010   Osteoarthritis 04/14/2007   Combined fat and carbohydrate induced hyperlipemia 03/31/2007   Obesity 03/31/2007   Essential (primary) hypertension 03/31/2007    Immunization History  Administered Date(s) Administered   Fluad Quad(high Dose 65+) 11/23/2018, 12/21/2019, 12/10/2020   Influenza,inj,Quad PF,6+ Mos 11/16/2012, 11/15/2013, 11/14/2014, 11/14/2015, 11/03/2016, 11/09/2017   Influenza,inj,quad, With Preservative 11/02/2016   Moderna SARS-COV2 Booster Vaccination 07/24/2020   Moderna Sars-Covid-2 Vaccination 04/27/2019, 05/26/2019, 01/08/2020   Pneumococcal Conjugate-13 10/11/2013   Pneumococcal Polysaccharide-23 10/07/2011   Tdap 12/16/2020   Zoster Recombinat (Shingrix) 12/16/2020   Zoster, Live 04/05/2006    Conditions to be addressed/monitored: HTN, HLD, DMII, and obesity  Care Plan : Medication Management  Updates made by Beryle Lathe, [place] since 03/26/2021 12:00 AM     Problem: T2DM, HTN, HLD, Obesity   Priority: High  Onset Date:  03/26/2021     Long-Range Goal: Disease Progression Prevention   Start Date: 03/26/2021  Expected End Date: 06/24/2021  This Visit's Progress: On track  Priority: High  Note:   Current Barriers:  Unable to independently afford treatment regimen Unable to independently monitor therapeutic efficacy Unable to achieve control of diabetes, hyperlipidemia, and weight management Suboptimal therapeutic regimen for diabetes and hyperlipidemia  Pharmacist Clinical Goal(s):  Through collaboration with PharmD and provider, patient will  Verbalize ability to afford treatment regimen Achieve adherence to monitoring guidelines and medication adherence to achieve therapeutic efficacy Achieve control of diabetes, hyperlipidemia, and weight management as evidenced by improved fasting blood sugar, improved A1c, improved LDL, and continued weight loss Adhere to plan to optimize therapeutic regimen for diabetes as evidenced by report of adherence to recommended medication management changes   Interventions: 1:1 collaboration with Fayrene Helper, MD regarding development and update of comprehensive plan of care as evidenced by provider attestation and co-signature Inter-disciplinary care team collaboration (see longitudinal plan of care) Comprehensive medication review performed; medication list updated in electronic medical record  Type 2 Diabetes - New goal.: Follows with endocrinology Rayetta Pigg, NP) Uncontrolled; Most recent A1c improved 9.7%>8.2% but remains above goal of <7% per ADA guidelines Current medications: metformin 500 mg by mouth once daily, glipizide XL 5 mg by mouth once daily, and Humalog Mix 75/25 55 units subcutaneously twice daily Recent changes: endocrinology recently increased Humalog Mix 75/25 from 50 units twice daily to 55 units twice daily Intolerances: none Taking medications as directed: yes Side effects thought to be attributed to current medication regimen:  no Denies hypoglycemic symptoms (sweaty and shaky). Hypoglycemia prevention: not indicated at this time Current meal patterns: breakfast: cereal and fruit; lunch: sandwich; dinner: baked/grilled chicken, Fried chicken, potatoes, and vegetables; snacks: chips and nut, popcorn, pretzels, cheez-its; beverages: water, regular soda, sweet tea, and milk; Current exercise: not active due to recent foot surgery On a statin: yes On aspirin 81 mg daily: yes Last microalbumin/creatinine ratio: <7 (06/23/20); on an ACEi/ARB: yes Last eye exam: completed within last year Last foot exam: completed within last year Pneumonia vaccine: series complete Influenza vaccine: up to date Shingrix: dose 1 or 2 completed October 2022. Now due for  dose 2.  Current glucose readings:  patient did not bring blood glucose meter or log today The following education was provided: Healthy eating: plate method The following educational handouts were provided: Meal planning and carb counting, My carbohydrate guide, Dining out with diabetes, Planning healthy meals, and GLP-1 is an essential noninsulin hormone Patient identified as a good candidate for a GLP-1 receptor agonist given reduction in cardiovascular disease, slowed chronic kidney disease progression, low risk of hypoglycemia, increased satiety, and weight loss. Patient denies a personal or family history of medullary thyroid carcinoma (MTC) or Multiple Endocrine Neoplasia syndrome type 2 (MEN 2). Patient also denies any history of pancreatitis and she has had her gallbladder removed.  Discussed with primary care provider and endocrinology and will execute the following plan (endocrinology to send in prescriptions): Continue metformin 500 mg by mouth once daily. Due to current renal function, unable to increase. Will need to discontinue if glomerular filtration rate <30 mL/min Add semaglutide (Ozempic) 0.25 mg subcutaneously weekly x2 weeks then increase to 0.5 mg  subcutaneously weekly  Add insulin degludec Tyler Aas) 50 units subcutaneously once daily Discontinue glipizide XL Discontinue Humalog Mix 75/25  Instructed to monitor blood sugars twice a day at the following times: fasting (at least 8 hours since last food consumption), 5-15 minutes before dinner, and whenever patient experiences symptoms of hypo/hyperglycemia  Endocrinology is currently in the process of getting patient approved for a continuous glucose monitor through [employer]. Will continue to follow.  Discussed management of hypoglycemia. If blood sugar <70 at any time, treat with simple sugar such as 1/2 cup juice or regular soda or 3-4 glucose tablets. Recheck blood sugar in 15 minutes and repeat if blood sugar remains <70.  Continue follow-up with endocrinology Patient was shown appropriate injection technique for Ozempic today in clinic   Hypertension - Condition stable. Not addressed this visit.: Blood pressure under good control. Blood pressure is at goal of <130/80 mmHg per 2017 AHA/ACC guidelines. Current medications: hydrochlorothiazide 25 mg by mouth once daily and amlodipine-benazepril 5-22m by mouth once daily Intolerances: none Taking medications as directed: yes Side effects thought to be attributed to current medication regimen: no Denies dizziness and lightheadedness. Current home blood pressure: patient does not currently check Continue current medications as above Encourage dietary sodium restriction/DASH diet Recommend home blood pressure monitoring to discuss at next visit Discussed need for and importance of continued work on weight loss Discussed need for medication compliance  Hyperlipidemia - New goal.: Uncontrolled. LDL above goal of <70 due to very high risk given 10-year risk >20% per 2020 AACE/ACE guidelines. Triglycerides at goal of <150 per 2020 AACE/ACE guidelines. Current medications: pravastatin 40 mg by mouth once daily Intolerances:  simvastatin  (muscle cramps)  Taking medications as directed: yes Side effects thought to be attributed to current medication regimen: no Continue pravastatin 40 mg by mouth once daily for now given significant change to diabetes regimen. Do not want to cause confusion but will address elevated LDL at future visits. Will need to likely increase to high intensity statin.  Encourage dietary reduction of high fat containing foods such as butter, nuts, bacon, egg yolks, etc. Discussed need for and importance of continued work on weight loss Discussed need for medication compliance  Overweight/Obesity - New goal.: Unable to achieve goal weight loss through lifestyle modification alone Current treatment:  none   Medications potentially contributing to weight gain: insulin and sulfonylurea Medications previously tried:  none Strategies previously tried: none History of bariatric surgery:  none Baseline weight: 229 lbs; most recent weight: 221 lbs Patient has a weight scale at home and uses periodically Recommend that the patient emphasize lean proteins, fruits and vegetables, whole grains and increased fiber consumption, adequate hydration Add semaglutide (Ozempic) 0.25 mg subcutaneously weekly x2 weeks then increase to 0.5 mg subcutaneously weekly Discontinue glipizide  Patient Goals/Self-Care Activities Patient will:  Focus on medication adherence by keeping up with prescription refills and either using a pill box or reminders to take your medications at the prescribed times Check blood sugar twice a day at the following times: fasting (at least 8 hours since last food consumption), 5-15 minutes before dinner, and whenever patient experiences symptoms of hypo/hyperglycemia, document, and provide at future appointments Collaborate with provider on medication access solutions Engage in dietary modifications by fewer sweetened foods & beverages  Follow Up Plan: Telephone follow up appointment with care  management team member scheduled for: 04/01/21      Medication Assistance:  patient has Medicare Extra Help (LIS)  Patient's preferred pharmacy is:  [facility], [address] AT [address] [place] [address] Phone: [phone] Fax: [phone]  [place], [place] - [place] [address] [place] [address] [place] [address] Phone: [phone] Fax: [phone]  Follow Up:  Patient agrees to Care Plan and Follow-up.  Plan: Telephone follow up appointment with care management team member scheduled for:  04/01/21  Kennon Holter, PharmD, [place], [place] Clinical Pharmacist Practitioner [facility] [phone]

## 2021-03-26 NOTE — Addendum Note (Signed)
Addended by: Brita Romp on: 03/26/2021 11:34 AM   Modules accepted: Orders

## 2021-03-26 NOTE — Telephone Encounter (Signed)
Spoke with the patient and sent the [place] 2 to [facility]. Patient verbalized an understanding and hopes to hear from them soon.

## 2021-03-26 NOTE — Telephone Encounter (Signed)
Patient states she called [employer] and they told her she is disqualified with them for the Dexcom//Libre. Pt is asking for a call back

## 2021-03-26 NOTE — Telephone Encounter (Signed)
I changed her over to [place] and Ozempic to give her a bit more control.  I talked it over with Kennon Holter the registered clinical pharmacist.  We can try sending it through [facility]

## 2021-03-26 NOTE — Telephone Encounter (Signed)
Patient will not qualify for Dexcom or Libre due to only giving 2 insulin injections per day per [employer].  I see instructions for Humalog 75/25 in her last office visit note but I see only Tyler Aas listed on her medication list.   Please clarify medication.

## 2021-03-31 ENCOUNTER — Other Ambulatory Visit: Payer: Self-pay

## 2021-03-31 ENCOUNTER — Telehealth: Payer: Self-pay | Admitting: Family Medicine

## 2021-03-31 MED ORDER — DEXCOM G6 TRANSMITTER MISC: 1 refills | Status: DC

## 2021-03-31 MED ORDER — BD PEN NEEDLE SHORT U/F 31G X 8 MM MISC: 0 refills | Status: DC

## 2021-03-31 MED ORDER — DEXCOM G6 SENSOR MISC: 1 refills | Status: DC

## 2021-03-31 MED ORDER — DEXCOM G6 RECEIVER DEVI: 0 refills | Status: DC

## 2021-03-31 NOTE — Telephone Encounter (Signed)
BD ultra fine short pen needles, please send this to [facility] [address]

## 2021-03-31 NOTE — Telephone Encounter (Signed)
Refills sent

## 2021-04-01 ENCOUNTER — Ambulatory Visit: Payer: Medicare Other | Admitting: Pharmacist

## 2021-04-01 DIAGNOSIS — I10 Essential (primary) hypertension: Secondary | ICD-10-CM

## 2021-04-01 DIAGNOSIS — E782 Mixed hyperlipidemia: Secondary | ICD-10-CM

## 2021-04-01 DIAGNOSIS — E1159 Type 2 diabetes mellitus with other circulatory complications: Secondary | ICD-10-CM

## 2021-04-01 DIAGNOSIS — E6609 Other obesity due to excess calories: Secondary | ICD-10-CM

## 2021-04-01 NOTE — Patient Instructions (Signed)
Coni Homesley,  It was great to talk to you today!  Please call me with any questions or concerns.  Visit Information  Following are the goals we discussed today:   Goals Addressed             This Visit's Progress    Medication Management       Patient Goals/Self-Care Activities Patient will:  Focus on medication adherence by keeping up with prescription refills and either using a pill box or reminders to take your medications at the prescribed times Check blood sugar twice a day at the following times: fasting (at least 8 hours since last food consumption), 5-15 minutes before dinner, and whenever patient experiences symptoms of hypo/hyperglycemia, document, and provide at future appointments Collaborate with provider on medication access solutions Engage in dietary modifications by fewer sweetened foods & beverages Medication changes Add semaglutide (Ozempic) 0.25 mg subcutaneously weekly x2 weeks then increase to 0.5 mg subcutaneously weekly  Add insulin degludec Tyler Aas) 50 units subcutaneously once daily Discontinue glipizide XL Discontinue Humalog Mix 75/25          Follow-up plan: Telephone follow up appointment with care management team member scheduled for:  04/09/21 Future Appointments  Date Time Provider [place]  04/09/2021  8:15 AM Edrick Kins, DPM [facility]  04/09/2021  3:00 PM [facility] PHARMACIST [facility]  06/11/2021 11:20 AM Fayrene Helper, MD [facility]  06/23/2021  1:00 PM Brita Romp, NP [facility] None  02/26/2022  8:00 AM [facility] NURSE [facility]  03/17/2022 10:00 AM Fayrene Helper, MD [facility]    The patient verbalized understanding of instructions, educational materials, and care plan provided today and agreed to receive a mailed copy of patient instructions, educational materials, and care plan.   Please call the care guide team at [phone] if you need to cancel or reschedule your appointment.    Kennon Holter, PharmD, [place], CPP Clinical Pharmacist Practitioner [facility] [phone]

## 2021-04-01 NOTE — Chronic Care Management (AMB) (Signed)
Chronic Care Management Pharmacy Note  04/01/2021 Name:  Stacey Hanson MRN:  [id number] DOB:  09-Jan-1946  Summary: Medication Assistance  None required. Patient affirms current coverage meets needs. Patient has Medicare Extra Help (Low Income Subsidy)  Type 2 Diabetes Recent changes Add semaglutide (Ozempic) 0.25 mg subcutaneously weekly x2 weeks then increase to 0.5 mg subcutaneously weekly  Add insulin degludec Tyler Aas) 50 units subcutaneously once daily Discontinue glipizide XL Discontinue Humalog Mix 75/25 Tresiba and Ozempic are both on backorder so patient has not been able to get from the pharmacy yet. Endocrinology is currently in the process of getting patient approved for a continuous glucose monitor through [employer]. Will continue to follow.  Continue metformin 500 mg by mouth once daily. Due to current renal function, unable to increase. Will need to discontinue if glomerular filtration rate <30 mL/min Continue Humalog 75/25 55 units subcutaneously twice daily and glipzide 5 mg every morning for now until [place] and Ozempic able to be obtained from pharmacy. Patient given instructions as above for diabetes management plan. Will continue to follow with patient closely and make adjustments as necessary.  Pharmacy was called today. I had them deactivate prescriptions for Humalog 75/25 and glipizide to avoid patient confusion. They have been updated on the plan to make the above therapeutic change. The pharmacy staffed quick ordered Tresiba and anticipate that it will arrive tomorrow. They will continue to try to order Ozempic and will fill for the patient when it comes in. The pharmacy staff confirmed copay for these medications were $10.35.   Hyperlipidemia Uncontrolled. LDL above goal of <70 due to very high risk given 10-year risk >20% per 2020 AACE/ACE guidelines. Continue pravastatin 40 mg by mouth once daily for now given significant change to diabetes regimen. Do not want  to cause confusion but will address elevated LDL at future visits. Will need to likely increase to high intensity statin.   Overweight/Obesity Add semaglutide (Ozempic) 0.25 mg subcutaneously weekly x2 weeks then increase to 0.5 mg subcutaneously weekly Discontinue glipizide   Subjective: Stacey Hanson is an 76 y.o. year old female who is a primary patient of Stacey Helper, MD.  The CCM team was consulted for assistance with disease management and care coordination needs.    Engaged with patient by telephone for follow up visit in response to provider referral for pharmacy case management and/or care coordination services.   Consent to Services:  The patient was given information about Chronic Care Management services, agreed to services, and gave verbal consent prior to initiation of services.  Please see initial visit note for detailed documentation.   Patient Care Team: Stacey Helper, MD as PCP - General [place] Lenise Herald, MD (Inactive) as PCP - Cardiology (Cardiology) Danie Binder, MD (Inactive) (Gastroenterology) Leta Baptist, MD as Attending Physician (Otolaryngology) Cassandria Anger, MD (Endocrinology) Carole Civil, MD as Consulting Physician (Orthopedic Surgery) Madelin Headings, DO (Optometry) Bernell List, CPhT as [place] Management (Pharmacy Technician) Eloise Harman, DO as Consulting Physician (Internal Medicine) Beryle Lathe, [facility] (Pharmacist)  Objective:  Lab Results  Component Value Date   CREATININE 1.39 (H) 12/28/2020   CREATININE 1.01 (H) 12/06/2020   CREATININE 1.21 (H) 08/12/2020    Lab Results  Component Value Date   HGBA1C 8.2 (A) 03/24/2021   Last diabetic Eye exam:  Lab Results  Component Value Date/Time   HMDIABEYEEXA No Retinopathy 08/20/2020 12:00 AM    Last diabetic Foot exam:  Lab  Results  Component Value Date/Time   HMDIABFOOTEX yes 03/18/2010 12:00 AM        Component  Value Date/Time   CHOL 168 12/06/2020 0957   TRIG 79 12/06/2020 0957   HDL 53 12/06/2020 0957   CHOLHDL 3.2 12/06/2020 0957   CHOLHDL 2.2 01/11/2016 0916   VLDL 11 01/11/2016 0916   LDLCALC 100 (H) 12/06/2020 0957    Hepatic Function Latest Ref Rng & Units 12/28/2020 12/06/2020 08/12/2020  Total Protein 6.5 - 8.1 g/dL 7.7 7.6 7.9  Albumin 3.5 - 5.0 g/dL 4.0 4.6 4.7  AST 15 - 41 U/L _0 ALT 0 - 44 U/L _1 Alk Phosphatase 38 - 126 U/L 99 141(H) 122(H)  Total Bilirubin 0.3 - 1.2 mg/dL 0.6 0.3 0.4  Bilirubin, Direct 0.0 - 0.3 mg/dL - - -    Lab Results  Component Value Date/Time   TSH 0.328 (L) 12/06/2020 09:56 AM   TSH 0.307 (L) 08/12/2020 09:43 AM   FREET4 1.54 12/06/2020 09:56 AM   FREET4 1.42 08/12/2020 09:43 AM    CBC Latest Ref Rng & Units 12/28/2020 08/08/2018 02/09/2018  WBC 4.0 - 10.5 K/uL 10.3 7.0 8.7  Hemoglobin 12.0 - 15.0 g/dL 12.0 13.0 12.1  Hematocrit 36.0 - 46.0 % 36.2 37.5 36.9  Platelets 150 - 400 K/uL 403(H) 451(H) 548(H)    Lab Results  Component Value Date/Time   VD25OH 37.3 12/06/2020 09:57 AM   VD25OH 50.5 11/06/2017 09:47 AM    Clinical ASCVD: No  The 10-year ASCVD risk score (Arnett DK, et al., 2019) is: 27.8%   Values used to calculate the score:     Age: 57 years     Sex: Female     Is Non-Hispanic African American: Yes     Diabetic: Yes     Tobacco smoker: No     Systolic Blood Pressure: 333 mmHg     Is BP treated: Yes     HDL Cholesterol: 53 mg/dL     Total Cholesterol: 168 mg/dL    Social History   Tobacco Use  Smoking Status Former   Packs/day: 0.25   Years: 10.00   Pack years: 2.50   Types: Cigarettes   Quit date: 02/20/1985   Years since quitting: 36.1  Smokeless Tobacco Never   BP Readings from Last 3 Encounters:  03/24/21 126/75  03/12/21 130/70  12/29/20 113/63   Pulse Readings from Last 3 Encounters:  03/24/21 96  03/12/21 92  12/29/20 95   Wt Readings from Last 3 Encounters:  03/24/21 217 lb 3.2 oz  (98.5 kg)  03/12/21 221 lb 0.6 oz (100.3 kg)  12/28/20 223 lb (101.2 kg)    Assessment: Review of patient past medical history, allergies, medications, health status, including review of consultants reports, laboratory and other test data, was performed as part of comprehensive evaluation and provision of chronic care management services.   SDOH:  (Social Determinants of Health) assessments and interventions performed:    CCM Care Plan  Allergies  Allergen Reactions   Simvastatin Other (See Comments)    Muscle cramps    Medications Reviewed Today     Reviewed by Beryle Lathe, [facility] (Pharmacist) on 04/01/21 at 1445  Med List Status: <None>   Medication Order Taking? Sig Documenting Provider Last Dose Status Informant  alendronate (FOSAMAX) 70 MG tablet [id number] Yes Take 1 tablet (70 mg total) by mouth every 7 (seven) days. Take with a full glass of water on an  empty stomach. Stacey Helper, MD Taking Active   amLODipine-benazepril (LOTREL) 5-40 MG capsule [id number] Yes Take 1 capsule by mouth daily. Stacey Helper, MD Taking Active   aspirin EC 81 MG tablet [id number] Yes Take 81 mg by mouth daily. [provider] Taking Active Self  B-D UF III MINI PEN NEEDLES 31G X 5 MM MISC [id number]  USE FOR TWICE DAILY INJECTIONS OF Lendon Ka, MD  Active Self  Blood Glucose Monitoring Suppl (ONE TOUCH ULTRA 2) w/Device Drucie Opitz [id number] Yes Twice daily testing dx e11.65 Stacey Helper, MD Taking Active Self  Calcium Carb-Cholecalciferol (CALCIUM 600/VITAMIN D3 PO) [id number] Yes Take 2 tablets by mouth daily. [provider] Taking Active Self  Continuous Blood Gluc Receiver (DEXCOM G6 RECEIVER) DEVI [id number] No Use to check blood glucose 4 times daily.  Patient not taking: Reported on 04/01/2021   Brita Romp, NP Not Taking Active   Continuous Blood Gluc Sensor (DEXCOM G6 SENSOR) MISC [id number] No Use 1 sensor every 10 days.  Patient  not taking: Reported on 04/01/2021   Brita Romp, NP Not Taking Active   Continuous Blood Gluc Transmit (DEXCOM G6 TRANSMITTER) MISC [id number] No Use 1 transmitter for 90 days.  Patient not taking: Reported on 04/01/2021   Brita Romp, NP Not Taking Active   glucose blood (ONETOUCH ULTRA) test strip [id number] Yes USE TO CHECK BLOOD SUGAR TWICE DAILY. Stacey Helper, MD Taking Active   hydrochlorothiazide (HYDRODIURIL) 25 MG tablet [id number] Yes TAKE 1 TABLET(25 MG) BY MOUTH DAILY Stacey Helper, MD Taking Active   HYDROcodone-acetaminophen (NORCO/VICODIN) 5-325 MG tablet [id number] No Take 1 tablet by mouth every 4 (four) hours as needed for severe pain.  Patient not taking: Reported on 03/26/2021   Edrick Kins, DPM Not Taking Active   insulin degludec (TRESIBA FLEXTOUCH) 100 UNIT/ML FlexTouch Pen [id number] No Inject 50 Units into the skin at bedtime.  Patient not taking: Reported on 04/01/2021   Brita Romp, NP Not Taking Active            Med Note Currie [place] Apr 01, 2021  2:42 PM) Still taking Humalog Mix 75/25 55 units twice daily for now. Tyler Aas apparently on backorder  Insulin Pen Needle (B-D ULTRAFINE III SHORT PEN) 31G X 8 MM MISC [id number]  USE TO INJECT INSULIN TWICE DAILY Stacey Helper, MD  Active   Lancets [facility] DELICA PLUS [id number]) [place] [id number]  USE TWICE DAILY TO CHECK BLOOD SUGAR. Stacey Helper, MD  Active   levothyroxine (SYNTHROID) 88 MCG tablet [id number] Yes TAKE 1 TABLET(88 MCG) BY MOUTH DAILY BEFORE AND BREAKFAST Brita Romp, NP Taking Active   metFORMIN (GLUCOPHAGE) 500 MG tablet [id number] Yes TAKE 1 TABLET(500 MG) BY MOUTH DAILY WITH A MEAL Nida, Marella Chimes, MD Taking Active   MODERNA COVID-19 VACCINE 100 MCG/0.5ML injection [id number] No  [provider] Unknown Active   Multiple Vitamin (MULTIVITAMIN) capsule [id number] Yes Take 1 capsule by mouth daily. [provider] Taking  Active Self  omeprazole (PRILOSEC) 20 MG capsule [id number] Yes TAKE 1 CAPSULE BY MOUTH EVERY MORNING Mahala Menghini, PA-C Taking Active   polyethylene glycol (MIRALAX / GLYCOLAX) 17 g packet [id number] Yes Take 17 g by mouth as needed. [provider] Taking Active   pravastatin (PRAVACHOL) 40 MG tablet [id number] Yes TAKE 1 TABLET(40 MG) BY MOUTH DAILY Stacey Helper, MD Taking Active   psyllium (METAMUCIL) 58.6 % packet  [id number] Yes Take 1 packet by mouth as needed. [provider] Taking Active   Semaglutide,0.25 or 0.5MG/DOS, (OZEMPIC, 0.25 OR 0.5 MG/DOSE,) 2 MG/1.5ML SOPN [id number] No Inject 0.5 mg into the skin once a week. Inject 0.25 mg SQ weekly x 2 weeks, then advance to 0.5 mg SQ weekly thereafter if tolerated well  Patient not taking: Reported on 04/01/2021   Brita Romp, NP Not Taking Active            Med Note Beryle Lathe   Tue Apr 01, 2021  2:43 PM) Still taking glipizide 5 mg every morning  UNABLE TO FIND [id number]  Diabetic shoes x 1 pair, Inserts x 3 pair DX E11.9 Stacey Helper, MD  Active             Patient Active Problem List   Diagnosis Date Noted   Chronic hip pain, left 06/26/2020   Type 2 diabetes mellitus with vascular disease ([place]) 05/29/2019   S/P total knee replacement 12/21/2017   Pain in right knee 07/14/2017   Back pain 04/30/2015   Backache 04/30/2015   Seasonal allergies 02/21/2015   Annual physical exam 09/27/2014   Osteopenia 06/20/2013   Hypothyroidism following radioiodine therapy 12/28/2010   Pure hypercholesterolemia 09/25/2010   Gastroesophageal reflux disease 09/22/2010   Osteoarthritis 04/14/2007   Combined fat and carbohydrate induced hyperlipemia 03/31/2007   Obesity 03/31/2007   Essential (primary) hypertension 03/31/2007    Immunization History  Administered Date(s) Administered   Fluad Quad(high Dose 65+) 11/23/2018, 12/21/2019, 12/10/2020   Influenza,inj,Quad PF,6+ Mos 11/16/2012,  11/15/2013, 11/14/2014, 11/14/2015, 11/03/2016, 11/09/2017   Influenza,inj,quad, With Preservative 11/02/2016   Moderna SARS-COV2 Booster Vaccination 07/24/2020   Moderna Sars-Covid-2 Vaccination 04/27/2019, 05/26/2019, 01/08/2020   Pneumococcal Conjugate-13 10/11/2013   Pneumococcal Polysaccharide-23 10/07/2011   Tdap 12/16/2020   Zoster Recombinat (Shingrix) 12/16/2020   Zoster, Live 04/05/2006    Conditions to be addressed/monitored: HTN, HLD, DMII, and obesity  Care Plan : Medication Management  Updates made by Beryle Lathe, [place] since 04/01/2021 12:00 AM     Problem: T2DM, HTN, HLD, Obesity   Priority: High  Onset Date: 03/26/2021     Long-Range Goal: Disease Progression Prevention   Start Date: 03/26/2021  Expected End Date: 06/24/2021  Recent Progress: On track  Priority: High  Note:   Current Barriers:  Unable to independently monitor therapeutic efficacy Unable to achieve control of diabetes, hyperlipidemia, and weight management Suboptimal therapeutic regimen for diabetes and hyperlipidemia  Pharmacist Clinical Goal(s):  Through collaboration with PharmD and provider, patient will  Verbalize ability to afford treatment regimen Achieve adherence to monitoring guidelines and medication adherence to achieve therapeutic efficacy Achieve control of diabetes, hyperlipidemia, and weight management as evidenced by improved fasting blood sugar, improved A1c, improved LDL, and continued weight loss Adhere to plan to optimize therapeutic regimen for diabetes as evidenced by report of adherence to recommended medication management changes   Interventions: 1:1 collaboration with Stacey Helper, MD regarding development and update of comprehensive plan of care as evidenced by provider attestation and co-signature Inter-disciplinary care team collaboration (see longitudinal plan of care) Comprehensive medication review performed; medication list updated in electronic  medical record  Type 2 Diabetes - Goal on Track (progressing): YES.: Follows with endocrinology Rayetta Pigg, NP) Uncontrolled; Most recent A1c improved 9.7%>8.2% but remains above goal of <7% per ADA guidelines Current medications: metformin 500 mg by mouth once daily, semaglutide (Ozempic) 0.25 mg subcutaneously weekly x2 weeks then increase to 0.5  mg subcutaneously weekly, and insulin degludec Tyler Aas) 50 units subcutaneously once daily Recent changes Add semaglutide (Ozempic) 0.25 mg subcutaneously weekly x2 weeks then increase to 0.5 mg subcutaneously weekly  Add insulin degludec Tyler Aas) 50 units subcutaneously once daily Discontinue glipizide XL Discontinue Humalog Mix 75/25 Intolerances: none Taking medications as directed: no, Tresiba and Ozempic are both on backorder so patient has not been able to get from the pharmacy yet. Side effects thought to be attributed to current medication regimen: no Denies hypoglycemic symptoms (sweaty and shaky). Hypoglycemia prevention: not indicated at this time Current meal patterns: breakfast: cereal and fruit; lunch: sandwich; dinner: baked/grilled chicken, Fried chicken, potatoes, and vegetables; snacks: chips and nut, popcorn, pretzels, cheez-its; beverages: water, regular soda, sweet tea, and milk; Current exercise: not active due to recent foot surgery On a statin: yes On aspirin 81 mg daily: yes Last microalbumin/creatinine ratio: <7 (06/23/20); on an ACEi/ARB: yes Last eye exam: completed within last year Last foot exam: completed within last year Pneumonia vaccine: series complete Influenza vaccine: up to date Shingrix: dose 1 or 2 completed October 2022. Now due for dose 2.  Current glucose readings:  not discussed today The following education was previously provided: Healthy eating: plate method The following educational handouts were previously provided: Meal planning and carb counting, My carbohydrate guide, Dining out with  diabetes, Planning healthy meals, and GLP-1 is an essential noninsulin hormone Patient was shown appropriate injection technique for Ozempic previously Instructed to monitor blood sugars twice a day at the following times: fasting (at least 8 hours since last food consumption), 5-15 minutes before dinner, and whenever patient experiences symptoms of hypo/hyperglycemia  Endocrinology is currently in the process of getting patient approved for a continuous glucose monitor through [employer]. Will continue to follow.  Continue metformin 500 mg by mouth once daily. Due to current renal function, unable to increase. Will need to discontinue if glomerular filtration rate <30 mL/min Continue Humalog 75/25 55 units subcutaneously twice daily and glipzide 5 mg every morning for now until [place] and Ozempic able to be obtained from pharmacy. Patient given instructions as above for diabetes management plan. Will continue to follow with patient closely and make adjustments as necessary.  Pharmacy was called today. I had them deactivate prescriptions for Humalog 75/25 and glipizide to avoid patient confusion. They have been updated on the plan to make the above therapeutic change. The pharmacy staffed quick ordered Tresiba and anticipate that it will arrive tomorrow. They will continue to try to order Ozempic and will fill for the patient when it comes in. The pharmacy staff confirmed copay for these medications were $10.35.  Discussed management of hypoglycemia. If blood sugar <70 at any time, treat with simple sugar such as 1/2 cup juice or regular soda or 3-4 glucose tablets. Recheck blood sugar in 15 minutes and repeat if blood sugar remains <70.  Continue follow-up with endocrinology  Hypertension - Condition stable. Not addressed this visit.: Blood pressure under good control. Blood pressure is at goal of <130/80 mmHg per 2017 AHA/ACC guidelines. Current medications: hydrochlorothiazide 25 mg by mouth once daily  and amlodipine-benazepril 5-43m by mouth once daily Intolerances: none Taking medications as directed: yes Side effects thought to be attributed to current medication regimen: no Denies dizziness and lightheadedness. Current home blood pressure: patient does not currently check Continue current medications as above Encourage dietary sodium restriction/DASH diet Recommend home blood pressure monitoring to discuss at next visit Discussed need for and importance of continued work on  weight loss Discussed need for medication compliance  Hyperlipidemia - Condition stable. Not addressed this visit.: Uncontrolled. LDL above goal of <70 due to very high risk given 10-year risk >20% per 2020 AACE/ACE guidelines. Triglycerides at goal of <150 per 2020 AACE/ACE guidelines. Current medications: pravastatin 40 mg by mouth once daily Intolerances:  simvastatin (muscle cramps)  Taking medications as directed: yes Side effects thought to be attributed to current medication regimen: no Continue pravastatin 40 mg by mouth once daily for now given significant change to diabetes regimen. Do not want to cause confusion but will address elevated LDL at future visits. Will need to likely increase to high intensity statin.  Encourage dietary reduction of high fat containing foods such as butter, nuts, bacon, egg yolks, etc. Discussed need for and importance of continued work on weight loss Discussed need for medication compliance  Overweight/Obesity - Goal on Track (progressing): YES.: Unable to achieve goal weight loss through lifestyle modification alone Current treatment:  none   Medications potentially contributing to weight gain: insulin and sulfonylurea Medications previously tried:  none Strategies previously tried: none History of bariatric surgery: none Baseline weight: 229 lbs; most recent weight: 221 lbs Patient has a weight scale at home and uses periodically Recommend that the patient emphasize  lean proteins, fruits and vegetables, whole grains and increased fiber consumption, adequate hydration Add semaglutide (Ozempic) 0.25 mg subcutaneously weekly x2 weeks then increase to 0.5 mg subcutaneously weekly Discontinue glipizide  Patient Goals/Self-Care Activities Patient will:  Focus on medication adherence by keeping up with prescription refills and either using a pill box or reminders to take your medications at the prescribed times Check blood sugar twice a day at the following times: fasting (at least 8 hours since last food consumption), 5-15 minutes before dinner, and whenever patient experiences symptoms of hypo/hyperglycemia, document, and provide at future appointments Collaborate with provider on medication access solutions Engage in dietary modifications by fewer sweetened foods & beverages Medication changes Add semaglutide (Ozempic) 0.25 mg subcutaneously weekly x2 weeks then increase to 0.5 mg subcutaneously weekly  Add insulin degludec Tyler Aas) 50 units subcutaneously once daily Discontinue glipizide XL Discontinue Humalog Mix 75/25  Follow Up Plan: Telephone follow up appointment with care management team member scheduled for: 04/09/21      Medication Assistance: None required.  Patient affirms current coverage meets needs. Patient has Medicare Extra Help (Low Income Subsidy)  Patient's preferred pharmacy is:  [facility], [address] [place] [address] Phone: [phone] Fax: [phone]  [place] - [place], [place] - [place] [address] [place] [address] [place] [address] Phone: [phone] Fax: [phone]  [facility], [place] (New Address) - [place], [place] - [place] AT Previously: [address], [address] [place] [address] Phone: [phone]  Fax: [phone]  Follow Up:  Patient agrees to Care Plan and Follow-up.  Plan: Telephone follow up appointment with care management team member scheduled for:  04/09/21  Kennon Holter, PharmD, [place], [place] Clinical Pharmacist Practitioner [facility] [phone]

## 2021-04-09 ENCOUNTER — Ambulatory Visit (INDEPENDENT_AMBULATORY_CARE_PROVIDER_SITE_OTHER): Payer: Medicare Other | Admitting: Pharmacist

## 2021-04-09 ENCOUNTER — Other Ambulatory Visit: Payer: Self-pay

## 2021-04-09 ENCOUNTER — Ambulatory Visit (INDEPENDENT_AMBULATORY_CARE_PROVIDER_SITE_OTHER): Payer: Medicare Other | Admitting: Podiatry

## 2021-04-09 ENCOUNTER — Ambulatory Visit (INDEPENDENT_AMBULATORY_CARE_PROVIDER_SITE_OTHER): Payer: Medicare Other

## 2021-04-09 DIAGNOSIS — Z9889 Other specified postprocedural states: Secondary | ICD-10-CM

## 2021-04-09 DIAGNOSIS — I10 Essential (primary) hypertension: Secondary | ICD-10-CM

## 2021-04-09 DIAGNOSIS — E782 Mixed hyperlipidemia: Secondary | ICD-10-CM

## 2021-04-09 DIAGNOSIS — E6609 Other obesity due to excess calories: Secondary | ICD-10-CM

## 2021-04-09 DIAGNOSIS — E1159 Type 2 diabetes mellitus with other circulatory complications: Secondary | ICD-10-CM

## 2021-04-09 NOTE — Patient Instructions (Signed)
Stacey Hanson,  It was great to talk to you today!  Please call me with any questions or concerns.  Visit Information  Following are the goals we discussed today:   Goals Addressed             This Visit's Progress    Medication Management       Patient Goals/Self-Care Activities Patient will:  Focus on medication adherence by keeping up with prescription refills and either using a pill box or reminders to take your medications at the prescribed times Check blood sugar twice a day at the following times: fasting (at least 8 hours since last food consumption), 5-15 minutes before dinner, and whenever patient experiences symptoms of hypo/hyperglycemia, document, and provide at future appointments Collaborate with provider on medication access solutions Engage in dietary modifications by fewer sweetened foods & beverages          Follow-up plan: Telephone follow up appointment with care management team member scheduled for:  04/24/21 Future Appointments  Date Time Provider [place]  04/24/2021  3:45 PM RPC-CCM PHARMACIST RPC-RPC RPC  06/11/2021 11:20 AM Fayrene Helper, MD RPC-RPC RPC  06/23/2021  1:00 PM Brita Romp, NP REA-REA None  02/26/2022  8:00 AM RPC-RPC NURSE RPC-RPC RPC  03/17/2022 10:00 AM Fayrene Helper, MD RPC-RPC RPC    The patient verbalized understanding of instructions, educational materials, and care plan provided today and agreed to receive a mailed copy of patient instructions, educational materials, and care plan.   Please call the care guide team at [phone] if you need to cancel or reschedule your appointment.   Kennon Holter, PharmD, [place], CPP Clinical Pharmacist Practitioner [facility] [phone]

## 2021-04-09 NOTE — Progress Notes (Signed)
Subjective:  Patient presents today status post percutaneous pin fixation and reduction of the proximal phalanx of the left great toe. DOS: 01/29/2021.  Overall the patient is doing very well.  She has no pain associated to the foot or toe.  She has been walking in the surgical shoe with the assistance of a cane.  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    Knee pain    Obesity    Past Surgical History:  Procedure Laterality Date   ABDOMINAL HYSTERECTOMY  03/02/1968   CATARACT EXTRACTION W/PHACO Left 03/09/2019   Procedure: CATARACT EXTRACTION PHACO AND INTRAOCULAR LENS PLACEMENT ([place]);  Surgeon: Baruch Goldmann, MD;  Location: [facility];  Service: Ophthalmology;  Laterality: Left;  CDE: 9.08   CATARACT EXTRACTION W/PHACO Right 03/27/2019   Procedure: CATARACT EXTRACTION PHACO AND INTRAOCULAR LENS PLACEMENT RIGHT EYE;  Surgeon: Baruch Goldmann, MD;  Location: [facility];  Service: Ophthalmology;  Laterality: Right;  CDE: 7.47   CHOLECYSTECTOMY  OCT 2010 BZ BILIARY DYSKINESIA   CHRONIC CHOLECYSTITIS   COLONOSCOPY  MAY 2009 SCREENING   [facility] TICS, [facility]   COLONOSCOPY N/A 08/11/2017   Procedure: COLONOSCOPY;  Surgeon: Danie Binder, MD;  Location: [facility];  Service: Endoscopy;  Laterality: N/A;  9:30am   ESOPHAGOGASTRODUODENOSCOPY  10/03/2010   Procedure: ESOPHAGOGASTRODUODENOSCOPY (EGD);  Surgeon: Dorothyann Peng, MD;  Location: [facility];  Service: Endoscopy;  Laterality: N/A;   ESOPHAGOGASTRODUODENOSCOPY  12/19/2002   RMR: Normal esophagus/Normal stomach/ Duodenum large bulbar diverticulum, 1 cm bulbar ulcer with surrounding/ inflammation  as described above. Normal D2   FOOT SURGERY Left 01/29/2021   POLYPECTOMY  08/11/2017   Procedure: POLYPECTOMY;  Surgeon: Danie Binder, MD;  Location: [facility];  Service: Endoscopy;;  cecal    SAVORY DILATION  10/03/2010   Procedure: SAVORY DILATION;  Surgeon: Dorothyann Peng, MD;  Location: [facility];  Service: Endoscopy;;   TOTAL ABDOMINAL HYSTERECTOMY  03/02/1968   TOTAL KNEE ARTHROPLASTY Left 12/21/2017   Procedure: LEFT TOTAL KNEE ARTHROPLASTY;  Surgeon: Paralee Cancel, MD;  Location: [facility];  Service: Orthopedics;  Laterality: Left;  70 mins block   UPPER GASTROINTESTINAL ENDOSCOPY  OCT 2004 RMR   DUO TIC, & ULCER   Allergies  Allergen Reactions   Simvastatin Other (See Comments)    Muscle cramps     Objective/Physical Exam Neurovascular status intact.  Overall there is good alignment of the toe clinically.  As expected there is some stiffness and limited range of motion of the IPJ however the patient has no pain on palpation throughout the toe.  Radiographic Exam today 04/09/2021 left foot:  No hardware noted.  There is track holes where the percutaneous pins used to be.  No significant change from prior x-rays.  Fractures in decent alignment.  The distal head of the proximal phalanx does appear to be dorsally displaced but this is stable.  Mostly unchanged since last x-rays were taken  Assessment: 1. s/p ORIF left hallux comminuted intra-articular fracture. DOS: 01/29/2021   Plan of Care:  1.  Patient was evaluated. X-rays reviewed.  2.  Patient discontinued the postsurgical shoe.  Resume good supportive shoes and sneakers 3.  Mechanical debridement of the nails to the left foot was performed using a nail nipper without incident or bleeding as a courtesy for the patient.  I did not want the close toed sneakers to rub or jam into elongated toenails. 4.  Patient is now full activity no restrictions 5.  Return to clinic as needed  Edrick Kins, DPM [facility]  Dr. Edrick Kins, DPM    [address], [place] [address]                Office [phone]  Fax [phone]

## 2021-04-09 NOTE — Chronic Care Management (AMB) (Signed)
Chronic Care Management Pharmacy Note  04/09/2021 Name:  Stacey Hanson MRN:  [id number] DOB:  09-May-1945  Summary: Type 2 Diabetes Follows with endocrinology Rayetta Pigg, NP) Uncontrolled; Most recent A1c improved 9.7%>8.2% but remains above goal of <7% per ADA guidelines Recent changes Added semaglutide (Ozempic) 0.25 mg subcutaneously weekly x2 weeks then increase to 0.5 mg subcutaneously weekly. Patient started on 04/04/21 and plans to take every Friday. Added insulin degludec Tyler Aas) 50 units subcutaneously once daily. Patient started on 04/03/21 Discontinued glipizide XL Discontinued Humalog Mix 75/25 Patient reports fasting blood glucose in the low 200s. Just switched to new regimen a few days ago Instructed to monitor blood sugars twice a day at the following times: fasting (at least 8 hours since last food consumption), 5-15 minutes before dinner, and whenever patient experiences symptoms of hypo/hyperglycemia  Endocrinology is currently in the process of getting patient approved for a continuous glucose monitor through [employer]. Will continue to follow.  Continue metformin 500 mg by mouth once daily. Due to current renal function, unable to increase. Will need to discontinue if glomerular filtration rate <30 mL/min Continue semaglutide (Ozempic) 0.25 mg subcutaneously weekly x2 weeks then increase to 0.5 mg subcutaneously weekly. Continue insulin degludec Tyler Aas) 50 units subcutaneously once daily for now Follow-up in 2 weeks. If fasting blood glucose remains elevated, will consider increasing basal insulin. Started with conservative dose. Discussed management of hypoglycemia. If blood sugar <70 at any time, treat with simple sugar such as 1/2 cup juice or regular soda or 3-4 glucose tablets. Recheck blood sugar in 15 minutes and repeat if blood sugar remains <70.  Continue follow-up with endocrinology  Hyperlipidemia Uncontrolled. LDL above goal of <70 due to very high risk  given 10-year risk >20% per 2020 AACE/ACE guidelines. Continue pravastatin 40 mg by mouth once daily for now given significant change to diabetes regimen. Do not want to cause confusion but will address elevated LDL at future visits. Will need to likely increase to high intensity statin.   Overweight/Obesity Baseline weight: 229 lbs; most recent weight: 217 lbs Continue semaglutide (Ozempic) 0.25 mg subcutaneously weekly x2 weeks then increase to 0.5 mg subcutaneously weekly  Subjective: Stacey Hanson is an 76 y.o. year old female who is a primary patient of Fayrene Helper, MD.  The CCM team was consulted for assistance with disease management and care coordination needs.    Engaged with patient by telephone for follow up visit in response to provider referral for pharmacy case management and/or care coordination services.   Consent to Services:  The patient was given information about Chronic Care Management services, agreed to services, and gave verbal consent prior to initiation of services.  Please see initial visit note for detailed documentation.   Patient Care Team: Fayrene Helper, MD as PCP - General [place] [facility] Lenise Herald, MD (Inactive) as PCP - Cardiology (Cardiology) Danie Binder, MD (Inactive) (Gastroenterology) Leta Baptist, MD as Attending Physician (Otolaryngology) Cassandria Anger, MD (Endocrinology) Carole Civil, MD as Consulting Physician (Orthopedic Surgery) Madelin Headings, DO (Optometry) Bernell List, CPhT as [facility] (Pharmacy Technician) Eloise Harman, DO as Consulting Physician (Internal Medicine) Beryle Lathe, [facility] (Pharmacist)  Objective:  Lab Results  Component Value Date   CREATININE 1.39 (H) 12/28/2020   CREATININE 1.01 (H) 12/06/2020   CREATININE 1.21 (H) 08/12/2020    Lab Results  Component Value Date   HGBA1C 8.2 (A) 03/24/2021   Last diabetic Eye exam:  Lab  Results   Component Value Date/Time   HMDIABEYEEXA No Retinopathy 08/20/2020 12:00 AM    Last diabetic Foot exam:  Lab Results  Component Value Date/Time   HMDIABFOOTEX yes 03/18/2010 12:00 AM        Component Value Date/Time   CHOL 168 12/06/2020 0957   TRIG 79 12/06/2020 0957   HDL 53 12/06/2020 0957   CHOLHDL 3.2 12/06/2020 0957   CHOLHDL 2.2 01/11/2016 0916   VLDL 11 01/11/2016 0916   LDLCALC 100 (H) 12/06/2020 0957    Hepatic Function Latest Ref Rng & Units 12/28/2020 12/06/2020 08/12/2020  Total Protein 6.5 - 8.1 g/dL 7.7 7.6 7.9  Albumin 3.5 - 5.0 g/dL 4.0 4.6 4.7  AST 15 - 41 U/L _0 ALT 0 - 44 U/L _1 Alk Phosphatase 38 - 126 U/L 99 141(H) 122(H)  Total Bilirubin 0.3 - 1.2 mg/dL 0.6 0.3 0.4  Bilirubin, Direct 0.0 - 0.3 mg/dL - - -    Lab Results  Component Value Date/Time   TSH 0.328 (L) 12/06/2020 09:56 AM   TSH 0.307 (L) 08/12/2020 09:43 AM   FREET4 1.54 12/06/2020 09:56 AM   FREET4 1.42 08/12/2020 09:43 AM    CBC Latest Ref Rng & Units 12/28/2020 08/08/2018 02/09/2018  WBC 4.0 - 10.5 K/uL 10.3 7.0 8.7  Hemoglobin 12.0 - 15.0 g/dL 12.0 13.0 12.1  Hematocrit 36.0 - 46.0 % 36.2 37.5 36.9  Platelets 150 - 400 K/uL 403(H) 451(H) 548(H)    Lab Results  Component Value Date/Time   VD25OH 37.3 12/06/2020 09:57 AM   VD25OH 50.5 11/06/2017 09:47 AM    Clinical ASCVD: No  The 10-year ASCVD risk score (Arnett DK, et al., 2019) is: 27.8%   Values used to calculate the score:     Age: 76 years     Sex: Female     Is Non-Hispanic African American: Yes     Diabetic: Yes     Tobacco smoker: No     Systolic Blood Pressure: 729 mmHg     Is BP treated: Yes     HDL Cholesterol: 53 mg/dL     Total Cholesterol: 168 mg/dL    Social History   Tobacco Use  Smoking Status Former   Packs/day: 0.25   Years: 10.00   Pack years: 2.50   Types: Cigarettes   Quit date: 02/20/1985   Years since quitting: 36.1  Smokeless Tobacco Never   BP Readings from Last 3  Encounters:  03/24/21 126/75  03/12/21 130/70  12/29/20 113/63   Pulse Readings from Last 3 Encounters:  03/24/21 96  03/12/21 92  12/29/20 95   Wt Readings from Last 3 Encounters:  03/24/21 217 lb 3.2 oz (98.5 kg)  03/12/21 221 lb 0.6 oz (100.3 kg)  12/28/20 223 lb (101.2 kg)    Assessment: Review of patient past medical history, allergies, medications, health status, including review of consultants reports, laboratory and other test data, was performed as part of comprehensive evaluation and provision of chronic care management services.   SDOH:  (Social Determinants of Health) assessments and interventions performed:    CCM Care Plan  Allergies  Allergen Reactions   Simvastatin Other (See Comments)    Muscle cramps    Medications Reviewed Today     Reviewed by Beryle Lathe, [facility] (Pharmacist) on 04/09/21 at 1506  Med List Status: <None>   Medication Order Taking? Sig Documenting Provider Last Dose Status Informant  alendronate (FOSAMAX) 70 MG tablet [id number]  Yes Take 1 tablet (70 mg total) by mouth every 7 (seven) days. Take with a full glass of water on an empty stomach. Fayrene Helper, MD Taking Active   amLODipine-benazepril (LOTREL) 5-40 MG capsule [id number] Yes Take 1 capsule by mouth daily. Fayrene Helper, MD Taking Active   aspirin EC 81 MG tablet [id number] Yes Take 81 mg by mouth daily. [provider] Taking Active Self  B-D UF III MINI PEN NEEDLES 31G X 5 MM MISC [id number] Yes USE FOR TWICE DAILY INJECTIONS OF HUMALOG Fayrene Helper, MD Taking Active Self  Blood Glucose Monitoring Suppl (ONE TOUCH ULTRA 2) w/Device KIT [id number] Yes Twice daily testing dx e11.65 Fayrene Helper, MD Taking Active Self  Calcium Carb-Cholecalciferol (CALCIUM 600/VITAMIN D3 PO) [id number] Yes Take 2 tablets by mouth daily. [provider] Taking Active Self  Continuous Blood Gluc Receiver (DEXCOM G6 RECEIVER) DEVI [id number] No Use to check  blood glucose 4 times daily.  Patient not taking: Reported on 04/01/2021   Brita Romp, NP Not Taking Active   Continuous Blood Gluc Sensor (DEXCOM G6 SENSOR) MISC [id number] No Use 1 sensor every 10 days.  Patient not taking: Reported on 04/01/2021   Brita Romp, NP Not Taking Active   Continuous Blood Gluc Transmit (DEXCOM G6 TRANSMITTER) MISC [id number] No Use 1 transmitter for 90 days.  Patient not taking: Reported on 04/01/2021   Brita Romp, NP Not Taking Active   glucose blood (ONETOUCH ULTRA) test strip [id number] Yes USE TO CHECK BLOOD SUGAR TWICE DAILY. Fayrene Helper, MD Taking Active   hydrochlorothiazide (HYDRODIURIL) 25 MG tablet [id number] Yes TAKE 1 TABLET(25 MG) BY MOUTH DAILY Fayrene Helper, MD Taking Active   HYDROcodone-acetaminophen (NORCO/VICODIN) 5-325 MG tablet [id number] No Take 1 tablet by mouth every 4 (four) hours as needed for severe pain.  Patient not taking: Reported on 03/26/2021   Edrick Kins, DPM Not Taking Active   insulin degludec (TRESIBA FLEXTOUCH) 100 UNIT/ML FlexTouch Pen [id number] Yes Inject 50 Units into the skin at bedtime. Brita Romp, NP Taking Active            Med Note Beryle Lathe   Wed Apr 09, 2021  3:01 PM)    Insulin Pen Needle (B-D ULTRAFINE III SHORT PEN) 31G X 8 MM MISC [id number] Yes USE TO INJECT INSULIN TWICE DAILY Fayrene Helper, MD Taking Active   Lancets (ONETOUCH DELICA PLUS FYBOFB51W) [place] [id number] Yes USE TWICE DAILY TO CHECK BLOOD SUGAR. Fayrene Helper, MD Taking Active   levothyroxine (SYNTHROID) 88 MCG tablet [id number] Yes TAKE 1 TABLET(88 MCG) BY MOUTH DAILY BEFORE AND BREAKFAST Brita Romp, NP Taking Active   metFORMIN (GLUCOPHAGE) 500 MG tablet [id number] Yes TAKE 1 TABLET(500 MG) BY MOUTH DAILY WITH A MEAL Nida, Marella Chimes, MD Taking Active   MODERNA COVID-19 VACCINE 100 MCG/0.5ML injection [id number]   [provider]  Active   Multiple Vitamin  (MULTIVITAMIN) capsule [id number] Yes Take 1 capsule by mouth daily. [provider] Taking Active Self  omeprazole (PRILOSEC) 20 MG capsule [id number] Yes TAKE 1 CAPSULE BY MOUTH EVERY MORNING Mahala Menghini, PA-C Taking Active   polyethylene glycol (MIRALAX / GLYCOLAX) 17 g packet [id number] Yes Take 17 g by mouth as needed. [provider] Taking Active   pravastatin (PRAVACHOL) 40 MG tablet [id number] Yes TAKE 1 TABLET(40 MG) BY MOUTH DAILY Fayrene Helper, MD Taking Active   psyllium (METAMUCIL) 58.6 % packet [id number]  Yes Take 1 packet by mouth as needed. [provider] Taking Active   Semaglutide,0.25 or 0.5MG/DOS, (OZEMPIC, 0.25 OR 0.5 MG/DOSE,) 2 MG/1.5ML SOPN [id number] Yes Inject 0.5 mg into the skin once a week. Inject 0.25 mg SQ weekly x 2 weeks, then advance to 0.5 mg SQ weekly thereafter if tolerated well Brita Romp, NP Taking Active            Med Note Vernell Barrier Apr 09, 2021  3:00 PM) Takes every Friday  UNABLE TO FIND [id number]  Diabetic shoes x 1 pair, Inserts x 3 pair DX E11.9 Fayrene Helper, MD  Active             Patient Active Problem List   Diagnosis Date Noted   Chronic hip pain, left 06/26/2020   Type 2 diabetes mellitus with vascular disease ([place]) 05/29/2019   S/P total knee replacement 12/21/2017   Pain in right knee 07/14/2017   Back pain 04/30/2015   Backache 04/30/2015   Seasonal allergies 02/21/2015   Annual physical exam 09/27/2014   Osteopenia 06/20/2013   Hypothyroidism following radioiodine therapy 12/28/2010   Pure hypercholesterolemia 09/25/2010   Gastroesophageal reflux disease 09/22/2010   Osteoarthritis 04/14/2007   Combined fat and carbohydrate induced hyperlipemia 03/31/2007   Obesity 03/31/2007   Essential (primary) hypertension 03/31/2007    Immunization History  Administered Date(s) Administered   Fluad Quad(high Dose 65+) 11/23/2018, 12/21/2019, 12/10/2020    Influenza,inj,Quad PF,6+ Mos 11/16/2012, 11/15/2013, 11/14/2014, 11/14/2015, 11/03/2016, 11/09/2017   Influenza,inj,quad, With Preservative 11/02/2016   Moderna SARS-COV2 Booster Vaccination 07/24/2020   Moderna Sars-Covid-2 Vaccination 04/27/2019, 05/26/2019, 01/08/2020   Pneumococcal Conjugate-13 10/11/2013   Pneumococcal Polysaccharide-23 10/07/2011   Tdap 12/16/2020   Zoster Recombinat (Shingrix) 12/16/2020   Zoster, Live 04/05/2006    Conditions to be addressed/monitored: HTN, HLD, DMII, and obesity  Care Plan : Medication Management  Updates made by Beryle Lathe, [place] since 04/09/2021 12:00 AM     Problem: T2DM, HTN, HLD, Obesity   Priority: High  Onset Date: 03/26/2021     Long-Range Goal: Disease Progression Prevention   Start Date: 03/26/2021  Expected End Date: 06/24/2021  Recent Progress: On track  Priority: High  Note:   Current Barriers:  Unable to independently monitor therapeutic efficacy Unable to achieve control of diabetes, hyperlipidemia, and weight management Suboptimal therapeutic regimen for hyperlipidemia  Pharmacist Clinical Goal(s):  Through collaboration with PharmD and provider, patient will  Achieve adherence to monitoring guidelines and medication adherence to achieve therapeutic efficacy Achieve control of diabetes, hyperlipidemia, and weight management as evidenced by improved fasting blood sugar, improved A1c, improved LDL, and continued weight loss Adhere to plan to optimize therapeutic regimen for hyperlipidemia as evidenced by report of adherence to recommended medication management changes   Interventions: 1:1 collaboration with Fayrene Helper, MD regarding development and update of comprehensive plan of care as evidenced by provider attestation and co-signature Inter-disciplinary care team collaboration (see longitudinal plan of care) Comprehensive medication review performed; medication list updated in electronic medical  record  Type 2 Diabetes - Goal on Track (progressing): YES.: Follows with endocrinology Rayetta Pigg, NP) Uncontrolled; Most recent A1c improved 9.7%>8.2% but remains above goal of <7% per ADA guidelines Current medications: metformin 500 mg by mouth once daily, semaglutide (Ozempic) 0.25 mg subcutaneously weekly x2 weeks then increase to 0.5 mg subcutaneously weekly, and insulin degludec Tyler Aas) 50 units subcutaneously once daily Recent changes Added semaglutide (Ozempic) 0.25 mg subcutaneously weekly x2 weeks  then increase to 0.5 mg subcutaneously weekly. Patient started on 04/04/21 and plans to take every Friday. Added insulin degludec Tyler Aas) 50 units subcutaneously once daily. Patient started on 04/03/21 Discontinued glipizide XL Discontinued Humalog Mix 75/25 Intolerances: none Taking medications as directed: yes Side effects thought to be attributed to current medication regimen: no Denies hypoglycemic symptoms (sweaty and shaky). Hypoglycemia prevention: not indicated at this time Current meal patterns: breakfast: cereal and fruit; lunch: sandwich; dinner: baked/grilled chicken, Fried chicken, potatoes, and vegetables; snacks: chips and nut, popcorn, pretzels, cheez-its; beverages: water, regular soda, sweet tea, and milk; Current exercise: not active due to recent foot surgery On a statin: yes On aspirin 81 mg daily: yes Last microalbumin/creatinine ratio: <7 (06/23/20); on an ACEi/ARB: yes Last eye exam: completed within last year Last foot exam: completed within last year Pneumonia vaccine: series complete Influenza vaccine: up to date Shingrix: dose 1 or 2 completed October 2022. Now due for dose 2.  Current glucose readings:  patient reports fasting blood glucose in the low 200s. Just switched to new regimen a few days ago The following education was previously provided: Healthy eating: plate method The following educational handouts were previously provided: Meal planning  and carb counting, My carbohydrate guide, Dining out with diabetes, Planning healthy meals, and GLP-1 is an essential noninsulin hormone Patient was shown appropriate injection technique for Ozempic previously Instructed to monitor blood sugars twice a day at the following times: fasting (at least 8 hours since last food consumption), 5-15 minutes before dinner, and whenever patient experiences symptoms of hypo/hyperglycemia  Endocrinology is currently in the process of getting patient approved for a continuous glucose monitor through [employer]. Will continue to follow.  Continue metformin 500 mg by mouth once daily. Due to current renal function, unable to increase. Will need to discontinue if glomerular filtration rate <30 mL/min Continue semaglutide (Ozempic) 0.25 mg subcutaneously weekly x2 weeks then increase to 0.5 mg subcutaneously weekly. Continue insulin degludec Tyler Aas) 50 units subcutaneously once daily for now Follow-up in 2 weeks. If fasting blood glucose remains elevated, will consider increasing basal insulin. Started with conservative dose. Discussed management of hypoglycemia. If blood sugar <70 at any time, treat with simple sugar such as 1/2 cup juice or regular soda or 3-4 glucose tablets. Recheck blood sugar in 15 minutes and repeat if blood sugar remains <70.  Continue follow-up with endocrinology  Hypertension - Condition stable. Not addressed this visit.: Blood pressure under good control. Blood pressure is at goal of <130/80 mmHg per 2017 AHA/ACC guidelines. Current medications: hydrochlorothiazide 25 mg by mouth once daily and amlodipine-benazepril 5-63m by mouth once daily Intolerances: none Taking medications as directed: yes Side effects thought to be attributed to current medication regimen: no Denies dizziness and lightheadedness. Current home blood pressure: patient does not currently check Continue current medications as above Encourage dietary sodium  restriction/DASH diet Recommend home blood pressure monitoring to discuss at next visit Discussed need for and importance of continued work on weight loss Discussed need for medication compliance  Hyperlipidemia - Condition stable. Not addressed this visit.: Uncontrolled. LDL above goal of <70 due to very high risk given 10-year risk >20% per 2020 AACE/ACE guidelines. Triglycerides at goal of <150 per 2020 AACE/ACE guidelines. Current medications: pravastatin 40 mg by mouth once daily Intolerances:  simvastatin (muscle cramps)  Taking medications as directed: yes Side effects thought to be attributed to current medication regimen: no Continue pravastatin 40 mg by mouth once daily for now given significant change to  diabetes regimen. Do not want to cause confusion but will address elevated LDL at future visits. Will need to likely increase to high intensity statin.  Encourage dietary reduction of high fat containing foods such as butter, nuts, bacon, egg yolks, etc. Discussed need for and importance of continued work on weight loss Discussed need for medication compliance  Overweight/Obesity - Goal on Track (progressing): YES.: Unable to achieve goal weight loss through lifestyle modification alone Current treatment: semaglutide (Ozempic) 0.25 mg subcutaneously weekly  Medications potentially contributing to weight gain: insulin Medications previously tried:  none Strategies previously tried: none History of bariatric surgery: none Baseline weight: 229 lbs; most recent weight: 217 lbs Patient has a weight scale at home and uses periodically Recommend that the patient emphasize lean proteins, fruits and vegetables, whole grains and increased fiber consumption, adequate hydration Continue semaglutide (Ozempic) 0.25 mg subcutaneously weekly x2 weeks then increase to 0.5 mg subcutaneously weekly  Patient Goals/Self-Care Activities Patient will:  Focus on medication adherence by keeping up  with prescription refills and either using a pill box or reminders to take your medications at the prescribed times Check blood sugar twice a day at the following times: fasting (at least 8 hours since last food consumption), 5-15 minutes before dinner, and whenever patient experiences symptoms of hypo/hyperglycemia, document, and provide at future appointments Collaborate with provider on medication access solutions Engage in dietary modifications by fewer sweetened foods & beverages  Follow Up Plan: Telephone follow up appointment with care management team member scheduled for: 04/24/21      Medication Assistance: None required.  Patient affirms current coverage meets needs. Patient has Medicare LIS.  Patient's preferred pharmacy is:  [id number] - [place], [place] - [address] AT [place] [place] [address] [place] [address] Phone: [phone] Fax: [phone]  [place] - [place], [place] - [place] [address] [place] [address] [place] [address] Phone: [phone] Fax: [phone]  [facility] [place] (New Address) - [place], [place] - [place] AT Previously: [address] [place] [address] Phone: [phone] Fax: [phone]  Follow Up:  Patient agrees to Care Plan and Follow-up.  Plan: Telephone follow up appointment with care management team member scheduled for:  04/24/21  Kennon Holter, PharmD, [place], CPP Clinical Pharmacist Practitioner [facility] [phone]

## 2021-04-14 ENCOUNTER — Other Ambulatory Visit: Payer: Self-pay | Admitting: "Endocrinology

## 2021-04-14 DIAGNOSIS — E1165 Type 2 diabetes mellitus with hyperglycemia: Secondary | ICD-10-CM

## 2021-04-21 ENCOUNTER — Telehealth: Payer: Self-pay | Admitting: Family Medicine

## 2021-04-21 NOTE — Telephone Encounter (Signed)
Pt called in regard to [place]   Pt states that she declined the mail order service. Patient would like to keep meds going to [facility].

## 2021-04-22 ENCOUNTER — Other Ambulatory Visit: Payer: Self-pay

## 2021-04-22 NOTE — Telephone Encounter (Signed)
Pharmacy listed as [facility]

## 2021-04-24 ENCOUNTER — Ambulatory Visit: Payer: Medicare Other | Admitting: Pharmacist

## 2021-04-24 DIAGNOSIS — Z6831 Body mass index (BMI) 31.0-31.9, adult: Secondary | ICD-10-CM

## 2021-04-24 DIAGNOSIS — E6609 Other obesity due to excess calories: Secondary | ICD-10-CM

## 2021-04-24 DIAGNOSIS — E782 Mixed hyperlipidemia: Secondary | ICD-10-CM

## 2021-04-24 DIAGNOSIS — I10 Essential (primary) hypertension: Secondary | ICD-10-CM

## 2021-04-24 DIAGNOSIS — E1159 Type 2 diabetes mellitus with other circulatory complications: Secondary | ICD-10-CM

## 2021-04-24 NOTE — Patient Instructions (Signed)
Stacey Hanson,  It was great to talk to you today!  Please call me with any questions or concerns.  Visit Information  Following are the goals we discussed today:   Goals Addressed             This Visit's Progress    Medication Management       Patient Goals/Self-Care Activities Patient will:  Focus on medication adherence by keeping up with prescription refills and either using a pill box or reminders to take your medications at the prescribed times Check blood sugar twice a day at the following times: fasting (at least 8 hours since last food consumption), 5-15 minutes before dinner, and whenever patient experiences symptoms of hypo/hyperglycemia, document, and provide at future appointments Collaborate with provider on medication access solutions Engage in dietary modifications by fewer sweetened foods & beverages           Follow-up plan: Telephone follow up appointment with care management team member scheduled for:  05/22/21  The patient verbalized understanding of instructions, educational materials, and care plan provided today and declined offer to receive copy of patient instructions, educational materials, and care plan.   Please call the care guide team at [phone] if you need to cancel or reschedule your appointment.   Kennon Holter, PharmD, [place], CPP Clinical Pharmacist Practitioner [facility] [phone]

## 2021-04-24 NOTE — Chronic Care Management (AMB) (Signed)
Chronic Care Management Pharmacy Note  04/24/2021 Name:  Stacey Hanson MRN:  [id number] DOB:  1945-11-01  Summary: Type 2 Diabetes Follows with endocrinology Rayetta Pigg, NP) Uncontrolled; Most recent A1c improved 9.7%>8.2% but remains above goal of <7% per ADA guidelines Current medications: metformin 500 mg by mouth once daily, semaglutide (Ozempic) 0.25 mg subcutaneously weekly x2 weeks then increase to 0.5 mg subcutaneously weekly, and insulin degludec Tyler Aas) 50 units subcutaneously once daily Taking medications as directed: no, patient has not yet increased Ozempic to 0.5 mg weekly (still taking 0.25 mg weekly) Current glucose readings:  patient verbally reports fasting blood glucose ranging from 155-241 over last few weeks and pre-dinner blood glucose of 200-300s. Endocrinology is currently in the process of getting patient approved for a continuous glucose monitor through [employer]. Will continue to follow.  Continue metformin 500 mg by mouth once daily. Due to current renal function, unable to increase. Will need to discontinue if glomerular filtration rate <30 mL/min Patient instructed to increase semaglutide (Ozempic) to 0.5 mg subcutaneously weekly Due to elevated fasting blood glucose, will recommend increase in basal insulin to ~60 units per day (20% increase). Endocrinology wanted to start with conservative dose upon insulin product conversion. Will send message to endocrinology. Discussed management of hypoglycemia. If blood sugar <70 at any time, treat with simple sugar such as 1/2 cup juice or regular soda or 3-4 glucose tablets. Recheck blood sugar in 15 minutes and repeat if blood sugar remains <70.  Continue follow-up with endocrinology  Hyperlipidemia  Uncontrolled. LDL above goal of <70 due to very high risk given 10-year risk >20% per 2020 AACE/ACE guidelines.  Continue pravastatin 40 mg by mouth once daily for now given significant change to diabetes regimen.  Do not want to cause confusion but will address elevated LDL at future visits. Will need to likely increase to high intensity statin.   Overweight/Obesity  Increase semaglutide (Ozempic) to 0.5 mg subcutaneously weekly  Subjective: Stacey Hanson is an 76 y.o. year old female who is a primary patient of Fayrene Helper, MD.  The CCM team was consulted for assistance with disease management and care coordination needs.    Engaged with patient by telephone for follow up visit in response to provider referral for pharmacy case management and/or care coordination services.   Consent to Services:  The patient was given information about Chronic Care Management services, agreed to services, and gave verbal consent prior to initiation of services.  Please see initial visit note for detailed documentation.   Patient Care Team: Fayrene Helper, MD as PCP - General [place] [facility] Lenise Herald, MD (Inactive) as PCP - Cardiology (Cardiology) Danie Binder, MD (Inactive) (Gastroenterology) Leta Baptist, MD as Attending Physician (Otolaryngology) Cassandria Anger, MD (Endocrinology) Carole Civil, MD as Consulting Physician (Orthopedic Surgery) Madelin Headings, DO (Optometry) Bernell List, CPhT as [facility] (Pharmacy Technician) Eloise Harman, DO as Consulting Physician (Internal Medicine) Beryle Lathe, [facility] (Pharmacist)  Objective:  Lab Results  Component Value Date   CREATININE 1.39 (H) 12/28/2020   CREATININE 1.01 (H) 12/06/2020   CREATININE 1.21 (H) 08/12/2020    Lab Results  Component Value Date   HGBA1C 8.2 (A) 03/24/2021   Last diabetic Eye exam:  Lab Results  Component Value Date/Time   HMDIABEYEEXA No Retinopathy 08/20/2020 12:00 AM    Last diabetic Foot exam:  Lab Results  Component Value Date/Time   HMDIABFOOTEX yes 03/18/2010 12:00 AM  Component Value Date/Time   CHOL 168 12/06/2020 0957   TRIG 79  12/06/2020 0957   HDL 53 12/06/2020 0957   CHOLHDL 3.2 12/06/2020 0957   CHOLHDL 2.2 01/11/2016 0916   VLDL 11 01/11/2016 0916   LDLCALC 100 (H) 12/06/2020 0957    Hepatic Function Latest Ref Rng & Units 12/28/2020 12/06/2020 08/12/2020  Total Protein 6.5 - 8.1 g/dL 7.7 7.6 7.9  Albumin 3.5 - 5.0 g/dL 4.0 4.6 4.7  AST 15 - 41 U/L '23 20 21  ' ALT 0 - 44 U/L '18 17 21  ' Alk Phosphatase 38 - 126 U/L 99 141(H) 122(H)  Total Bilirubin 0.3 - 1.2 mg/dL 0.6 0.3 0.4  Bilirubin, Direct 0.0 - 0.3 mg/dL - - -    Lab Results  Component Value Date/Time   TSH 0.328 (L) 12/06/2020 09:56 AM   TSH 0.307 (L) 08/12/2020 09:43 AM   FREET4 1.54 12/06/2020 09:56 AM   FREET4 1.42 08/12/2020 09:43 AM    CBC Latest Ref Rng & Units 12/28/2020 08/08/2018 02/09/2018  WBC 4.0 - 10.5 K/uL 10.3 7.0 8.7  Hemoglobin 12.0 - 15.0 g/dL 12.0 13.0 12.1  Hematocrit 36.0 - 46.0 % 36.2 37.5 36.9  Platelets 150 - 400 K/uL 403(H) 451(H) 548(H)    Lab Results  Component Value Date/Time   VD25OH 37.3 12/06/2020 09:57 AM   VD25OH 50.5 11/06/2017 09:47 AM    Clinical ASCVD: No  The 10-year ASCVD risk score (Arnett DK, et al., 2019) is: 27.8%   Values used to calculate the score:     Age: 41 years     Sex: Female     Is Non-Hispanic African American: Yes     Diabetic: Yes     Tobacco smoker: No     Systolic Blood Pressure: 993 mmHg     Is BP treated: Yes     HDL Cholesterol: 53 mg/dL     Total Cholesterol: 168 mg/dL    Social History   Tobacco Use  Smoking Status Former   Packs/day: 0.25   Years: 10.00   Pack years: 2.50   Types: Cigarettes   Quit date: 02/20/1985   Years since quitting: 36.1  Smokeless Tobacco Never   BP Readings from Last 3 Encounters:  03/24/21 126/75  03/12/21 130/70  12/29/20 113/63   Pulse Readings from Last 3 Encounters:  03/24/21 96  03/12/21 92  12/29/20 95   Wt Readings from Last 3 Encounters:  03/24/21 217 lb 3.2 oz (98.5 kg)  03/12/21 221 lb 0.6 oz (100.3 kg)   12/28/20 223 lb (101.2 kg)    Assessment: Review of patient past medical history, allergies, medications, health status, including review of consultants reports, laboratory and other test data, was performed as part of comprehensive evaluation and provision of chronic care management services.   SDOH:  (Social Determinants of Health) assessments and interventions performed:    CCM Care Plan  Allergies  Allergen Reactions   Simvastatin Other (See Comments)    Muscle cramps    Medications Reviewed Today     Reviewed by Beryle Lathe, [facility] (Pharmacist) on 04/24/21 at 1557  Med List Status: <None>   Medication Order Taking? Sig Documenting Provider Last Dose Status Informant  alendronate (FOSAMAX) 70 MG tablet [id number] Yes Take 1 tablet (70 mg total) by mouth every 7 (seven) days. Take with a full glass of water on an empty stomach. Fayrene Helper, MD Taking Active   amLODipine-benazepril (LOTREL) 5-40 MG capsule [id number] Yes Take 1  capsule by mouth daily. Fayrene Helper, MD Taking Active   aspirin EC 81 MG tablet [id number] Yes Take 81 mg by mouth daily. [provider] Taking Active Self  B-D UF III MINI PEN NEEDLES 31G X 5 MM MISC [id number] Yes USE FOR TWICE DAILY INJECTIONS OF HUMALOG Fayrene Helper, MD Taking Active Self  Blood Glucose Monitoring Suppl (ONE TOUCH ULTRA 2) w/Device KIT [id number] Yes Twice daily testing dx e11.65 Fayrene Helper, MD Taking Active Self  Calcium Carb-Cholecalciferol (CALCIUM 600/VITAMIN D3 PO) [id number] Yes Take 2 tablets by mouth daily. [provider] Taking Active Self  Continuous Blood Gluc Receiver (DEXCOM G6 RECEIVER) DEVI [id number] No Use to check blood glucose 4 times daily.  Patient not taking: Reported on 04/01/2021   Brita Romp, NP Not Taking Active   Continuous Blood Gluc Sensor (DEXCOM G6 SENSOR) MISC [id number] No Use 1 sensor every 10 days.  Patient not taking: Reported on 04/01/2021    Brita Romp, NP Not Taking Active   Continuous Blood Gluc Transmit (DEXCOM G6 TRANSMITTER) MISC [id number] No Use 1 transmitter for 90 days.  Patient not taking: Reported on 04/01/2021   Brita Romp, NP Not Taking Active   glucose blood (ONETOUCH ULTRA) test strip [id number] Yes USE TO CHECK BLOOD SUGAR TWICE DAILY. Fayrene Helper, MD Taking Active   hydrochlorothiazide (HYDRODIURIL) 25 MG tablet [id number] Yes TAKE 1 TABLET(25 MG) BY MOUTH DAILY Fayrene Helper, MD Taking Active   HYDROcodone-acetaminophen (NORCO/VICODIN) 5-325 MG tablet [id number] No Take 1 tablet by mouth every 4 (four) hours as needed for severe pain.  Patient not taking: Reported on 03/26/2021   Edrick Kins, DPM Not Taking Active   insulin degludec (TRESIBA FLEXTOUCH) 100 UNIT/ML FlexTouch Pen [id number] Yes Inject 50 Units into the skin at bedtime. Brita Romp, NP Taking Active            Med Note Beryle Lathe   Wed Apr 09, 2021  3:01 PM)    Insulin Pen Needle (B-D ULTRAFINE III SHORT PEN) 31G X 8 MM MISC [id number] Yes USE TO INJECT INSULIN TWICE DAILY Fayrene Helper, MD Taking Active   Lancets (ONETOUCH DELICA PLUS NIDPOE42P) [place] [id number] Yes USE TWICE DAILY TO CHECK BLOOD SUGAR. Fayrene Helper, MD Taking Active   levothyroxine (SYNTHROID) 88 MCG tablet [id number] Yes TAKE 1 TABLET(88 MCG) BY MOUTH DAILY BEFORE AND BREAKFAST Brita Romp, NP Taking Active   metFORMIN (GLUCOPHAGE) 500 MG tablet [id number] Yes TAKE 1 TABLET(500 MG) BY MOUTH DAILY WITH A MEAL Brita Romp, NP Taking Active   MODERNA COVID-19 VACCINE 100 MCG/0.5ML injection [id number] No  [provider] Unknown Active   Multiple Vitamin (MULTIVITAMIN) capsule [id number] Yes Take 1 capsule by mouth daily. [provider] Taking Active Self  omeprazole (PRILOSEC) 20 MG capsule [id number] Yes TAKE 1 CAPSULE BY MOUTH EVERY MORNING Mahala Menghini, PA-C Taking Active   polyethylene glycol  (MIRALAX / GLYCOLAX) 17 g packet [id number] Yes Take 17 g by mouth as needed. [provider] Taking Active   pravastatin (PRAVACHOL) 40 MG tablet [id number] Yes TAKE 1 TABLET(40 MG) BY MOUTH DAILY Fayrene Helper, MD Taking Active   psyllium (METAMUCIL) 58.6 % packet [id number] Yes Take 1 packet by mouth as needed. [provider] Taking Active   Semaglutide,0.25 or 0.5MG/DOS, (OZEMPIC, 0.25 OR 0.5 MG/DOSE,) 2 MG/1.5ML SOPN [id number] Yes Inject 0.5 mg into the skin once a week. Inject 0.25 mg SQ  weekly x 2 weeks, then advance to 0.5 mg SQ weekly thereafter if tolerated well Brita Romp, NP Taking Active            Med Note Vernell Barrier Apr 09, 2021  3:00 PM) Takes every Friday  UNABLE TO FIND [id number]  Diabetic shoes x 1 pair, Inserts x 3 pair DX E11.9 Fayrene Helper, MD  Active             Patient Active Problem List   Diagnosis Date Noted   Chronic hip pain, left 06/26/2020   Type 2 diabetes mellitus with vascular disease ([place]) 05/29/2019   S/P total knee replacement 12/21/2017   Pain in right knee 07/14/2017   Back pain 04/30/2015   Backache 04/30/2015   Seasonal allergies 02/21/2015   Annual physical exam 09/27/2014   Osteopenia 06/20/2013   Hypothyroidism following radioiodine therapy 12/28/2010   Pure hypercholesterolemia 09/25/2010   Gastroesophageal reflux disease 09/22/2010   Osteoarthritis 04/14/2007   Combined fat and carbohydrate induced hyperlipemia 03/31/2007   Obesity 03/31/2007   Essential (primary) hypertension 03/31/2007    Immunization History  Administered Date(s) Administered   Fluad Quad(high Dose 65+) 11/23/2018, 12/21/2019, 12/10/2020   Influenza,inj,Quad PF,6+ Mos 11/16/2012, 11/15/2013, 11/14/2014, 11/14/2015, 11/03/2016, 11/09/2017   Influenza,inj,quad, With Preservative 11/02/2016   Moderna SARS-COV2 Booster Vaccination 07/24/2020   Moderna Sars-Covid-2 Vaccination 04/27/2019, 05/26/2019, 01/08/2020    Pneumococcal Conjugate-13 10/11/2013   Pneumococcal Polysaccharide-23 10/07/2011   Tdap 12/16/2020   Zoster Recombinat (Shingrix) 12/16/2020   Zoster, Live 04/05/2006    Conditions to be addressed/monitored: HTN, HLD, DMII, and obesity  Care Plan : Medication Management  Updates made by Beryle Lathe, [place] since 04/24/2021 12:00 AM     Problem: T2DM, HTN, HLD, Obesity   Priority: High  Onset Date: 03/26/2021     Long-Range Goal: Disease Progression Prevention   Start Date: 03/26/2021  Expected End Date: 06/24/2021  Recent Progress: On track  Priority: High  Note:   Current Barriers:  Unable to independently monitor therapeutic efficacy Unable to achieve control of diabetes, hyperlipidemia, and weight management Suboptimal therapeutic regimen for hyperlipidemia  Pharmacist Clinical Goal(s):  Through collaboration with PharmD and provider, patient will  Achieve adherence to monitoring guidelines and medication adherence to achieve therapeutic efficacy Achieve control of diabetes, hyperlipidemia, and weight management as evidenced by improved fasting blood sugar, improved A1c, improved LDL, and continued weight loss Adhere to plan to optimize therapeutic regimen for hyperlipidemia as evidenced by report of adherence to recommended medication management changes   Interventions: 1:1 collaboration with Fayrene Helper, MD regarding development and update of comprehensive plan of care as evidenced by provider attestation and co-signature Inter-disciplinary care team collaboration (see longitudinal plan of care) Comprehensive medication review performed; medication list updated in electronic medical record  Type 2 Diabetes - Goal on Track (progressing): YES.: Follows with endocrinology Rayetta Pigg, NP) Uncontrolled; Most recent A1c improved 9.7%>8.2% but remains above goal of <7% per ADA guidelines Current medications: metformin 500 mg by mouth once daily,  semaglutide (Ozempic) 0.25 mg subcutaneously weekly x2 weeks then increase to 0.5 mg subcutaneously weekly, and insulin degludec Tyler Aas) 50 units subcutaneously once daily Intolerances: none Taking medications as directed: no, patient has not yet increased Ozempic to 0.5 mg weekly (still taking 0.25 mg weekly) Side effects thought to be attributed to current medication regimen: no Denies hypoglycemic symptoms (sweaty and shaky). Hypoglycemia prevention: not indicated at this time Current meal patterns: breakfast: cereal and  fruit; lunch: sandwich; dinner: baked/grilled chicken, Fried chicken, potatoes, and vegetables; snacks: chips and nut, popcorn, pretzels, cheez-its; beverages: water, regular soda, sweet tea, and milk; Current exercise: not active due to recent foot surgery On a statin: yes On aspirin 81 mg daily: yes Last microalbumin/creatinine ratio: <7 (06/23/20); on an ACEi/ARB: yes Last eye exam: completed within last year Last foot exam: completed within last year Pneumonia vaccine: series complete Influenza vaccine: up to date Shingrix: dose 1 or 2 completed October 2022. Now due for dose 2.  Current glucose readings:  patient verbally reports fasting blood glucose ranging from 155-241 over last few weeks and pre-dinner blood glucose of 200-300s. The following education was previously provided: Healthy eating: plate method The following educational handouts were previously provided: Meal planning and carb counting, My carbohydrate guide, Dining out with diabetes, Planning healthy meals, and GLP-1 is an essential noninsulin hormone Patient was shown appropriate injection technique for Ozempic previously Instructed to monitor blood sugars twice a day at the following times: fasting (at least 8 hours since last food consumption), 5-15 minutes before dinner, and whenever patient experiences symptoms of hypo/hyperglycemia  Endocrinology is currently in the process of getting patient  approved for a continuous glucose monitor through [employer]. Will continue to follow.  Continue metformin 500 mg by mouth once daily. Due to current renal function, unable to increase. Will need to discontinue if glomerular filtration rate <30 mL/min Patient instructed to increase semaglutide (Ozempic) to 0.5 mg subcutaneously weekly Due to elevated fasting blood glucose, will recommend increase in basal insulin to ~60 units per day (20% increase). Endocrinology wanted to start with conservative dose upon insulin product conversion. Will send message to endocrinology. Discussed management of hypoglycemia. If blood sugar <70 at any time, treat with simple sugar such as 1/2 cup juice or regular soda or 3-4 glucose tablets. Recheck blood sugar in 15 minutes and repeat if blood sugar remains <70.  Continue follow-up with endocrinology  Hypertension - Condition stable. Not addressed this visit.: Blood pressure under good control. Blood pressure is at goal of <130/80 mmHg per 2017 AHA/ACC guidelines. Current medications: hydrochlorothiazide 25 mg by mouth once daily and amlodipine-benazepril 5-93m by mouth once daily Intolerances: none Taking medications as directed: yes Side effects thought to be attributed to current medication regimen: no Denies dizziness and lightheadedness. Current home blood pressure: patient does not currently check Continue current medications as above Encourage dietary sodium restriction/DASH diet Recommend home blood pressure monitoring to discuss at next visit Discussed need for and importance of continued work on weight loss Discussed need for medication compliance  Hyperlipidemia - Condition stable. Not addressed this visit.: Uncontrolled. LDL above goal of <70 due to very high risk given 10-year risk >20% per 2020 AACE/ACE guidelines. Triglycerides at goal of <150 per 2020 AACE/ACE guidelines. Current medications: pravastatin 40 mg by mouth once daily Intolerances:   simvastatin (muscle cramps)  Taking medications as directed: yes Side effects thought to be attributed to current medication regimen: no Continue pravastatin 40 mg by mouth once daily for now given significant change to diabetes regimen. Do not want to cause confusion but will address elevated LDL at future visits. Will need to likely increase to high intensity statin.  Encourage dietary reduction of high fat containing foods such as butter, nuts, bacon, egg yolks, etc. Discussed need for and importance of continued work on weight loss Discussed need for medication compliance  Overweight/Obesity - Goal on Track (progressing): YES.: Unable to achieve goal weight loss through  lifestyle modification alone Current treatment: semaglutide (Ozempic) 0.25 mg subcutaneously weekly  Medications potentially contributing to weight gain: insulin Medications previously tried:  none Strategies previously tried: none History of bariatric surgery: none Baseline weight: 229 lbs; most recent weight: 217 lbs Patient has a weight scale at home and uses periodically Recommend that the patient emphasize lean proteins, fruits and vegetables, whole grains and increased fiber consumption, adequate hydration Increase semaglutide (Ozempic) to 0.5 mg subcutaneously weekly  Patient Goals/Self-Care Activities Patient will:  Focus on medication adherence by keeping up with prescription refills and either using a pill box or reminders to take your medications at the prescribed times Check blood sugar twice a day at the following times: fasting (at least 8 hours since last food consumption), 5-15 minutes before dinner, and whenever patient experiences symptoms of hypo/hyperglycemia, document, and provide at future appointments Collaborate with provider on medication access solutions Engage in dietary modifications by fewer sweetened foods & beverages  Follow Up Plan: Telephone follow up appointment with care management  team member scheduled for: 05/22/21      Medication Assistance: None required.  Patient affirms current coverage meets needs.  Patient's preferred pharmacy is:  [id number] - [place], [place] - [address] AT [address] [place] [address] Phone: [phone] Fax: [phone]  Follow Up:  Patient agrees to Care Plan and Follow-up.  Plan: Telephone follow up appointment with care management team member scheduled for:  05/22/21  Kennon Holter, PharmD, [place], CPP Clinical Pharmacist Practitioner [facility] [phone]

## 2021-04-25 DIAGNOSIS — M79671 Pain in right foot: Secondary | ICD-10-CM | POA: Diagnosis not present

## 2021-04-25 DIAGNOSIS — M79675 Pain in left toe(s): Secondary | ICD-10-CM | POA: Diagnosis not present

## 2021-04-25 DIAGNOSIS — M79672 Pain in left foot: Secondary | ICD-10-CM | POA: Diagnosis not present

## 2021-04-25 DIAGNOSIS — E1151 Type 2 diabetes mellitus with diabetic peripheral angiopathy without gangrene: Secondary | ICD-10-CM | POA: Diagnosis not present

## 2021-04-25 DIAGNOSIS — M79674 Pain in right toe(s): Secondary | ICD-10-CM | POA: Diagnosis not present

## 2021-04-29 DIAGNOSIS — I10 Essential (primary) hypertension: Secondary | ICD-10-CM

## 2021-04-29 DIAGNOSIS — E782 Mixed hyperlipidemia: Secondary | ICD-10-CM | POA: Diagnosis not present

## 2021-04-29 DIAGNOSIS — E1159 Type 2 diabetes mellitus with other circulatory complications: Secondary | ICD-10-CM

## 2021-04-30 ENCOUNTER — Other Ambulatory Visit: Payer: Self-pay

## 2021-04-30 DIAGNOSIS — E1165 Type 2 diabetes mellitus with hyperglycemia: Secondary | ICD-10-CM

## 2021-04-30 MED ORDER — LEVOTHYROXINE SODIUM 88 MCG PO TABS
ORAL_TABLET | ORAL | 1 refills | Status: DC
Start: 2021-04-30 — End: 2021-05-27

## 2021-04-30 MED ORDER — OZEMPIC (0.25 OR 0.5 MG/DOSE) 2 MG/1.5ML ~~LOC~~ SOPN: 0.5000 mg | PEN_INJECTOR | SUBCUTANEOUS | 3 refills | Status: DC

## 2021-04-30 MED ORDER — OMEPRAZOLE 20 MG PO CPDR: 20.0000 mg | DELAYED_RELEASE_CAPSULE | Freq: Every morning | ORAL | 3 refills | Status: DC

## 2021-04-30 MED ORDER — ONETOUCH ULTRA VI STRP: ORAL_STRIP | 2 refills | Status: DC

## 2021-04-30 MED ORDER — ALENDRONATE SODIUM 70 MG PO TABS: 70.0000 mg | ORAL_TABLET | ORAL | 11 refills | Status: DC

## 2021-04-30 MED ORDER — TRESIBA FLEXTOUCH 100 UNIT/ML ~~LOC~~ SOPN: 50.0000 [IU] | PEN_INJECTOR | Freq: Every day | SUBCUTANEOUS | 3 refills | Status: DC

## 2021-04-30 MED ORDER — PRAVASTATIN SODIUM 40 MG PO TABS: ORAL_TABLET | ORAL | 1 refills | Status: DC

## 2021-04-30 MED ORDER — METFORMIN HCL 500 MG PO TABS: ORAL_TABLET | ORAL | 0 refills | Status: DC

## 2021-04-30 MED ORDER — HYDROCHLOROTHIAZIDE 25 MG PO TABS: ORAL_TABLET | ORAL | 3 refills | Status: DC

## 2021-04-30 MED ORDER — ONETOUCH DELICA PLUS LANCET33G MISC: 5 refills | Status: DC

## 2021-04-30 MED ORDER — AMLODIPINE BESY-BENAZEPRIL HCL 5-40 MG PO CAPS: 1.0000 | ORAL_CAPSULE | Freq: Every day | ORAL | 5 refills | Status: DC

## 2021-05-08 ENCOUNTER — Other Ambulatory Visit: Payer: Self-pay

## 2021-05-08 ENCOUNTER — Encounter: Payer: Self-pay | Admitting: Nurse Practitioner

## 2021-05-08 ENCOUNTER — Ambulatory Visit: Payer: Medicare Other | Admitting: Nurse Practitioner

## 2021-05-08 VITALS — BP 131/84 | HR 90 | Ht 67.0 in | Wt 213.0 lb

## 2021-05-08 DIAGNOSIS — E890 Postprocedural hypothyroidism: Secondary | ICD-10-CM | POA: Diagnosis not present

## 2021-05-08 DIAGNOSIS — I10 Essential (primary) hypertension: Secondary | ICD-10-CM

## 2021-05-08 DIAGNOSIS — E782 Mixed hyperlipidemia: Secondary | ICD-10-CM | POA: Diagnosis not present

## 2021-05-08 DIAGNOSIS — E1165 Type 2 diabetes mellitus with hyperglycemia: Secondary | ICD-10-CM

## 2021-05-08 MED ORDER — TRESIBA FLEXTOUCH 100 UNIT/ML ~~LOC~~ SOPN: 58.0000 [IU] | PEN_INJECTOR | Freq: Every day | SUBCUTANEOUS | 3 refills | Status: DC

## 2021-05-08 NOTE — Progress Notes (Signed)
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    Knee pain    Obesity    Past Surgical History:  Procedure Laterality Date   ABDOMINAL HYSTERECTOMY  03/02/1968   CATARACT EXTRACTION W/PHACO Left 03/09/2019   Procedure: CATARACT EXTRACTION PHACO AND INTRAOCULAR LENS PLACEMENT ([place]);  Surgeon: Baruch Goldmann, MD;  Location: [facility];  Service: Ophthalmology;  Laterality: Left;  CDE: 9.08   CATARACT EXTRACTION W/PHACO Right 03/27/2019   Procedure: CATARACT EXTRACTION PHACO AND INTRAOCULAR LENS PLACEMENT RIGHT EYE;  Surgeon: Baruch Goldmann, MD;  Location: [facility];  Service: Ophthalmology;  Laterality: Right;  CDE: 7.47   CHOLECYSTECTOMY  OCT 2010 BZ BILIARY DYSKINESIA   CHRONIC CHOLECYSTITIS   COLONOSCOPY  MAY 2009 SCREENING   pTC TICS, [facility]   COLONOSCOPY N/A 08/11/2017   Procedure: COLONOSCOPY;  Surgeon: Danie Binder, MD;  Location: [facility];  Service: Endoscopy;  Laterality: N/A;  9:30am   ESOPHAGOGASTRODUODENOSCOPY  10/03/2010   Procedure: ESOPHAGOGASTRODUODENOSCOPY (EGD);  Surgeon: Dorothyann Peng, MD;  Location: [facility];  Service: Endoscopy;  Laterality: N/A;   ESOPHAGOGASTRODUODENOSCOPY  12/19/2002   RMR: Normal esophagus/Normal stomach/ Duodenum large bulbar diverticulum, 1 cm bulbar ulcer with surrounding/ inflammation as described above. Normal D2   FOOT SURGERY Left 01/29/2021   POLYPECTOMY  08/11/2017   Procedure: POLYPECTOMY;  Surgeon: Danie Binder, MD;  Location: [facility];  Service: Endoscopy;;  cecal     SAVORY DILATION  10/03/2010   Procedure: SAVORY DILATION;  Surgeon: Dorothyann Peng, MD;  Location: [facility];  Service: Endoscopy;;   TOTAL ABDOMINAL HYSTERECTOMY  03/02/1968   TOTAL KNEE ARTHROPLASTY Left 12/21/2017   Procedure: LEFT TOTAL KNEE ARTHROPLASTY;  Surgeon: Paralee Cancel, MD;  Location: [facility];  Service: Orthopedics;  Laterality: Left;  70 mins block   UPPER GASTROINTESTINAL ENDOSCOPY  OCT 2004 RMR   DUO TIC, & ULCER   Social History   Socioeconomic History   Marital status: Single    Spouse name: Not on file   Number of children: 3   Years of education: 10   Highest education level: 10th grade  Occupational History   Occupation: [employer]   Occupation: Retired   Tobacco Use   Smoking status: Former    Packs/day: 0.25    Years: 10.00    Pack years: 2.50    Types: Cigarettes    Quit date: 02/20/1985    Years since quitting: 36.2   Smokeless tobacco: Never  Vaping Use   Vaping Use: Never used  Substance and Sexual Activity   Alcohol use: No    Comment: stopped drinking 1998   Drug use: No   Sexual activity: Not Currently  Other Topics Concern   Not on file  Social History Narrative   Lives alone and she retired from [employer] Strain: Medium Risk   Difficulty of Paying Living  Expenses: Somewhat hard  Food Insecurity: No Food Insecurity   Worried About Charity fundraiser in the Last Year: Never true   Ran Out of Food in the Last Year: Never true  Transportation Needs: No Transportation Needs   Lack of Transportation (Medical): No   Lack of Transportation (Non-Medical): No  Physical Activity: Inactive   Days of Exercise per Week: 0 days   Minutes of Exercise per Session: 0 min  Stress: No Stress Concern Present   Feeling of Stress : Only a little  Social Connections: Moderately Isolated   Frequency of Communication with Friends and Family: More than three times a week   Frequency of  Social Gatherings with Friends and Family: More than three times a week   Attends Religious Services: Never   Marine scientist or Organizations: Yes   Attends Archivist Meetings: 1 to 4 times per year   Marital Status: Never married   Outpatient Encounter Medications as of 05/08/2021  Medication Sig   alendronate (FOSAMAX) 70 MG tablet Take 1 tablet (70 mg total) by mouth every 7 (seven) days. Take with a full glass of water on an empty stomach.   amLODipine-benazepril (LOTREL) 5-40 MG capsule Take 1 capsule by mouth daily.   aspirin EC 81 MG tablet Take 81 mg by mouth daily.   B-D UF III MINI PEN NEEDLES 31G X 5 MM MISC USE FOR TWICE DAILY INJECTIONS OF HUMALOG   Blood Glucose Monitoring Suppl (ONE TOUCH ULTRA 2) w/Device KIT Twice daily testing dx e11.65   Calcium Carb-Cholecalciferol (CALCIUM 600/VITAMIN D3 PO) Take 2 tablets by mouth daily.   glucose blood (ONETOUCH ULTRA) test strip USE TO CHECK BLOOD SUGAR TWICE DAILY.   hydrochlorothiazide (HYDRODIURIL) 25 MG tablet TAKE 1 TABLET(25 MG) BY MOUTH DAILY   insulin degludec (TRESIBA FLEXTOUCH) 100 UNIT/ML FlexTouch Pen Inject 58 Units into the skin at bedtime.   Insulin Pen Needle (B-D ULTRAFINE III SHORT PEN) 31G X 8 MM MISC USE TO INJECT INSULIN TWICE DAILY   Lancets (ONETOUCH DELICA PLUS [id number]) MISC USE TWICE DAILY TO CHECK BLOOD SUGAR.   levothyroxine (SYNTHROID) 88 MCG tablet TAKE 1 TABLET(88 MCG) BY MOUTH DAILY BEFORE AND BREAKFAST   metFORMIN (GLUCOPHAGE) 500 MG tablet TAKE 1 TABLET(500MG) BY MOUTH DAILY WITH A MEAL.   MODERNA COVID-19 VACCINE 100 MCG/0.5ML injection    Multiple Vitamin (MULTIVITAMIN) capsule Take 1 capsule by mouth daily.   omeprazole (PRILOSEC) 20 MG capsule Take 1 capsule (20 mg total) by mouth every morning.   polyethylene glycol (MIRALAX / GLYCOLAX) 17 g packet Take 17 g by mouth as needed.   pravastatin (PRAVACHOL) 40 MG tablet TAKE 1 TABLET(40 MG) BY MOUTH DAILY   psyllium (METAMUCIL)  58.6 % packet Take 1 packet by mouth as needed.   Semaglutide,0.25 or 0.5MG/DOS, (OZEMPIC, 0.25 OR 0.5 MG/DOSE,) 2 MG/1.5ML SOPN Inject 0.5 mg into the skin once a week. Inject 0.25 mg SQ weekly x 2 weeks, then advance to 0.5 mg SQ weekly thereafter if tolerated well   UNABLE TO FIND Diabetic shoes x 1 pair, Inserts x 3 pair DX E11.9   [DISCONTINUED] Continuous Blood Gluc Receiver (DEXCOM G6 RECEIVER) DEVI Use to check blood glucose 4 times daily. (Patient not taking: Reported on 04/01/2021)   [DISCONTINUED] Continuous Blood Gluc Sensor (DEXCOM G6 SENSOR) MISC Use 1 sensor every 10 days. (Patient not taking: Reported on 04/01/2021)   [DISCONTINUED] Continuous Blood Gluc Transmit (DEXCOM G6 TRANSMITTER) MISC Use 1 transmitter  for 90 days. (Patient not taking: Reported on 04/01/2021)   [DISCONTINUED] HYDROcodone-acetaminophen (NORCO/VICODIN) 5-325 MG tablet Take 1 tablet by mouth every 4 (four) hours as needed for severe pain. (Patient not taking: Reported on 03/26/2021)   [DISCONTINUED] insulin degludec (TRESIBA FLEXTOUCH) 100 UNIT/ML FlexTouch Pen Inject 50 Units into the skin at bedtime.   No facility-administered encounter medications on file as of 05/08/2021.   ALLERGIES: Allergies  Allergen Reactions   Simvastatin Other (See Comments)    Muscle cramps   VACCINATION STATUS: Immunization History  Administered Date(s) Administered   Fluad Quad(high Dose 65+) 11/23/2018, 12/21/2019, 12/10/2020   Influenza,inj,Quad PF,6+ Mos 11/16/2012, 11/15/2013, 11/14/2014, 11/14/2015, 11/03/2016, 11/09/2017   Influenza,inj,quad, With Preservative 11/02/2016   Moderna SARS-COV2 Booster Vaccination 07/24/2020   Moderna Sars-Covid-2 Vaccination 04/27/2019, 05/26/2019, 01/08/2020   Pneumococcal Conjugate-13 10/11/2013   Pneumococcal Polysaccharide-23 10/07/2011   Tdap 12/16/2020   Zoster Recombinat (Shingrix) 12/16/2020   Zoster, Live 04/05/2006    Hypertension This is a chronic problem. The current  episode started more than 1 year ago. The problem has been resolved since onset. The problem is controlled. Pertinent negatives include no blurred vision, headaches, palpitations, shortness of breath or sweats. Agents associated with hypertension include thyroid hormones. Risk factors for coronary artery disease include diabetes mellitus, dyslipidemia, obesity, sedentary lifestyle and smoking/tobacco exposure. Past treatments include ACE inhibitors, diuretics and calcium channel blockers. The current treatment provides mild improvement. There are no compliance problems.  Hypertensive end-organ damage includes kidney disease. Identifiable causes of hypertension include chronic renal disease and a thyroid problem.  Diabetes She presents for her follow-up diabetic visit. She has type 2 diabetes mellitus. Onset time: She was diagnosed at approximate age of 44 years. Her disease course has been improving. Pertinent negatives for hypoglycemia include no confusion, headaches, nervousness/anxiousness, sweats or tremors. Associated symptoms include weight loss. Pertinent negatives for diabetes include no blurred vision, no fatigue, no foot paresthesias, no polydipsia, no polyphagia and no polyuria. There are no hypoglycemic complications. Symptoms are stable. Diabetic complications include nephropathy. Risk factors for coronary artery disease include dyslipidemia, diabetes mellitus, obesity, hypertension, sedentary lifestyle, post-menopausal and tobacco exposure. Current diabetic treatment includes insulin injections and oral agent (monotherapy) (and Ozempic). She is compliant with treatment most of the time. Her weight is decreasing steadily. She is following a generally unhealthy diet. When asked about meal planning, she reported none. She has not had a previous visit with a dietitian. She rarely participates in exercise. Her home blood glucose trend is decreasing steadily. Her breakfast blood glucose range is generally  140-180 mg/dl. Her dinner blood glucose range is generally >200 mg/dl. (She presents today with her meter and logs showing greatly improved glycemic profile overall, yet still above target.  Her previsit A1c was 8.2% on 1/23 improving from last visit of 9.7%.  She is tolerating her new regimen well, adjusting to her new routine, and has even lost some weight.  She denies any hypoglycemia.  Analysis of her meter shows 7-day average of 202, 14-day average of 198, 30-day average of 223.) An ACE inhibitor/angiotensin II receptor blocker is being taken. She does not see a podiatrist.Eye exam is current.  Thyroid Problem Presents for follow-up (-She is also status post radioactive iodine treatment for toxic nodule on October 17, 2010.  She has had  history of multinodular goiter status post fine-needle aspiration with benign findings.) visit. Symptoms include weight loss. Patient reports no anxiety, cold intolerance, constipation, depressed mood, diarrhea, fatigue, heat intolerance, leg swelling, palpitations, tremors  or weight gain. The symptoms have been stable.    Review of systems  Constitutional: + steadily decreasing body weight,  current Body mass index is 33.36 kg/m. , no fatigue, no subjective hyperthermia, no subjective hypothermia Eyes: no blurry vision, no xerophthalmia ENT: no sore throat, no nodules palpated in throat, no dysphagia/odynophagia, no hoarseness Cardiovascular: no chest pain, no shortness of breath, no palpitations, no leg swelling Respiratory: no cough, no shortness of breath Gastrointestinal: no nausea/vomiting/diarrhea Musculoskeletal: no muscle/joint aches Skin: no rashes, no hyperemia Neurological: no tremors, no numbness, no tingling, no dizziness Psychiatric: no depression, no anxiety    Objective:    BP 131/84    Pulse 90    Ht '5\' 7"'  (1.702 m)    Wt 213 lb (96.6 kg)    BMI 33.36 kg/m   Wt Readings from Last 3 Encounters:  05/08/21 213 lb (96.6 kg)  03/24/21 217  lb 3.2 oz (98.5 kg)  03/12/21 221 lb 0.6 oz (100.3 kg)    BP Readings from Last 3 Encounters:  05/08/21 131/84  03/24/21 126/75  03/12/21 130/70    Physical Exam- Limited  Constitutional:  Body mass index is 33.36 kg/m. , not in acute distress, normal state of mind Eyes:  EOMI, no exophthalmos Neck: Supple Cardiovascular: RRR, no murmurs, rubs, or gallops, no edema Respiratory: Adequate breathing efforts, no crackles, rales, rhonchi, or wheezing Musculoskeletal: no gross deformities, strength intact in all four extremities, no gross restriction of joint movements Skin:  no rashes, no hyperemia Neurological: no tremor with outstretched hands    CMP     Component Value Date/Time   NA 132 (L) 12/28/2020 2201   NA 133 (L) 12/06/2020 0956   K 3.6 12/28/2020 2201   CL 96 (L) 12/28/2020 2201   CO2 24 12/28/2020 2201   GLUCOSE 395 (H) 12/28/2020 2201   BUN 18 12/28/2020 2201   BUN 18 12/06/2020 0956   CREATININE 1.39 (H) 12/28/2020 2201   CREATININE 0.96 (H) 03/12/2016 0925   CALCIUM 8.8 (L) 12/28/2020 2201   PROT 7.7 12/28/2020 2201   PROT 7.6 12/06/2020 0956   ALBUMIN 4.0 12/28/2020 2201   ALBUMIN 4.6 12/06/2020 0956   AST 23 12/28/2020 2201   ALT 18 12/28/2020 2201   ALKPHOS 99 12/28/2020 2201   BILITOT 0.6 12/28/2020 2201   BILITOT 0.3 12/06/2020 0956   GFRNONAA 40 (L) 12/28/2020 2201   GFRNONAA 60 03/12/2016 0925   GFRAA 48 (L) 04/11/2020 1007   GFRAA 69 03/12/2016 0925     Diabetic Labs (most recent): Lab Results  Component Value Date   HGBA1C 8.2 (A) 03/24/2021   HGBA1C 9.7 (A) 12/19/2020   HGBA1C 8.3 (A) 08/19/2020     Lipid Panel ( most recent) Lipid Panel     Component Value Date/Time   CHOL 168 12/06/2020 0957   TRIG 79 12/06/2020 0957   HDL 53 12/06/2020 0957   CHOLHDL 3.2 12/06/2020 0957   CHOLHDL 2.2 01/11/2016 0916   VLDL 11 01/11/2016 0916   LDLCALC 100 (H) 12/06/2020 0957    Assessment & Plan:   1) Uncontrolled type 2 diabetes  mellitus with stage 3 chronic kidney disease, with long-term current use of insulin (HCC)  She presents today with her meter and logs showing greatly improved glycemic profile overall, yet still above target.  Her previsit A1c was 8.2% on 1/23 improving from last visit of 9.7%.  She is tolerating her new regimen well, adjusting to her new routine, and has even  lost some weight.  She denies any hypoglycemia.  Analysis of her meter shows 7-day average of 202, 14-day average of 198, 30-day average of 223.  - Nutritional counseling repeated at each appointment due to patients tendency to fall back in to old habits.  - The patient admits there is a room for improvement in their diet and drink choices. -  Suggestion is made for the patient to avoid simple carbohydrates from their diet including Cakes, Sweet Desserts / Pastries, Ice Cream, Soda (diet and regular), Sweet Tea, Candies, Chips, Cookies, Sweet Pastries, Store Bought Juices, Alcohol in Excess of 1-2 drinks a day, Artificial Sweeteners, Coffee Creamer, and "Sugar-free" Products. This will help patient to have stable blood glucose profile and potentially avoid unintended weight gain.   - I encouraged the patient to switch to unprocessed or minimally processed complex starch and increased protein intake (animal or plant source), fruits, and vegetables.   - Patient is advised to stick to a routine mealtimes to eat 3 meals a day and avoid unnecessary snacks (to snack only to correct hypoglycemia).  -She will tolerate slight increase in her Tresiba to 58 units SQ nightly.  She can continue her Ozempic 0.5 mg SQ weekly for now (may increase at next visit) and continue her Metformin 500 mg po daily with breakfast.   -She is advised to continue monitoring blood glucose twice daily, before breakfast and before bed, and to call the clinic if she has readings less than 70 or above 200 for 3 tests in a row.  Insurance would not cover CGM for her even when on  premixed insulin.  2) Hypothyroidism- s/p RAI -There are no recent TFTs to review.   She is advised to continue Levothyroxine 88 mcg po daily before breakfast. Will recheck TFTs prior to next visit and adjust dose if needed.   - We discussed about the correct intake of her thyroid hormone, on empty stomach at fasting, with water, separated by at least 30 minutes from breakfast and other medications,  and separated by more than 4 hours from calcium, iron, multivitamins, acid reflux medications (PPIs). -Patient is made aware of the fact that thyroid hormone replacement is needed for life, dose to be adjusted by periodic monitoring of thyroid function tests.  3) Weight management:  Her Body mass index is 33.36 kg/m.-a candidate for modest weight loss.  Patient specific carbs restrictions and exercise regimen discussed with her.  4) Blood pressure/ Hypertension: -Her blood pressure is controlled to target.  She is advised to continue Amlodipine Benazepril 5-40 mg po daily and HCTZ 25 mg po daily.    5) Hyperlipidemia: Her recent lipid panel from 12/06/20 showed uncontrolled LDL of 100.  She is advised to continue Pravastatin 40 mg po daily at bedtime.  Side effects and precautions discussed with her.  - I advised patient to maintain close follow up with Fayrene Helper, MD for diabetes and all other primary care needs.      I spent 36 minutes in the care of the patient today including review of labs from [place], Lipids, Thyroid Function, Hematology (current and previous including abstractions from other facilities); face-to-face time discussing  her blood glucose readings/logs, discussing hypoglycemia and hyperglycemia episodes and symptoms, medications doses, her options of short and long term treatment based on the latest standards of care / guidelines;  discussion about incorporating lifestyle medicine;  and documenting the encounter.    Please refer to Patient Instructions for Blood Glucose  Monitoring and  Insulin/Medications Dosing Guide"  in media tab for additional information. Please  also refer to " Patient Self Inventory" in the Media  tab for reviewed elements of pertinent patient history.  Stacey Hanson participated in the discussions, expressed understanding, and voiced agreement with the above plans.  All questions were answered to her satisfaction. she is encouraged to contact clinic should she have any questions or concerns prior to her return visit.    Follow up plan: Return in about 3 months (around 08/08/2021) for Diabetes F/U with A1c in office, Thyroid follow up, Previsit labs, Bring meter and logs.   Rayetta Pigg, [facility] [address] [place], [place] [address] Phone: [phone] Fax: [phone]  05/08/2021, 2:07 PM

## 2021-05-08 NOTE — Patient Instructions (Signed)
Diabetes Mellitus Emergency Preparedness Plan ?A diabetes emergency preparedness plan is a checklist to make sure you have everything you need to manage your diabetes in case of an emergency, such as an evacuation, natural disaster, national security emergency, or pandemic lockdown. ?Managing your diabetes is something you have to do all day every day. The American Diabetes Association and the American College of Endocrinology both recommend putting together an emergency diabetes kit. Your kit should include important information and documents as well as all the supplies you will need to manage your diabetes for at least 1 week. Store it in a portable, waterproof bag or container. The best time to start making your emergency kit is now. ?How to make your emergency kit ?Collect information and documents ?Include the following information and documents in your kit: ?The type of diabetes you have. ?A copy of your health insurance cards and photo ID. ?A list of all your other medical conditions, allergies, and surgeries. ?A list of all your medicines and doses with the contact information for your pharmacy. Ask your health care provider for a list of your current medicines. ?Any recent lab results, including your latest hemoglobin A1C (HbA1C). ?The make, model, and serial number of your insulin pump, if you use one. Also include contact information for the manufacturer. ?Contact information for people who should be notified in case of an emergency. Include your health care provider's name, address, and phone number. ?Collect diabetes care items ?Include the following diabetes care items in your kit: ?At least a 1-week supply of: ?Oral medicines. ?Insulin. ?Blood glucose testing supplies. These include testing strips, lancets, and extra batteries for your blood glucose monitor and pump. ?A charger for the continuous glucose monitor (CGM) receiver and pump. ?Any extra supplies needed for your CGM or pump. ?A supply of  glucagon, glucose tablets, juice, soda, or hard candy in case of hypoglycemia. ?Coolers or cold packs. ?A safe container for syringes, needles, and lancets. ? ?Other preparations ?Other things to consider doing as part of your emergency plan: ?Make sure that your mobile phone is charged and that you have an extra charger, cable, or batteries. ?Choose a meeting place for family members. ?Wear a medical alert or ID bracelet. ?If you have a child with diabetes, make sure your child's school has a copy of his or her emergency plan, including the name of the staff member who will assist your child. ?Where to find more information ?American Diabetes Association: www.diabetes.org ?Centers for Disease Control and Prevention: blogs.cdc.gov ?Summary ?A diabetes emergency preparedness plan is a checklist to make sure you have everything you need in case of an emergency. ?Your kit should include important information and documents as well as all the supplies you will need to manage your condition for at least 1 week. ?Store your kit in a portable, waterproof bag or container. ?The best time to start making your emergency kit is now. ?This information is not intended to replace advice given to you by your health care provider. Make sure you discuss any questions you have with your health care provider. ?Document Revised: 08/24/2019 Document Reviewed: 08/24/2019 ?Elsevier Patient Education ? 2022 [employer]vier [employer]. ? ?

## 2021-05-21 LAB — HM DIABETES EYE EXAM

## 2021-05-22 ENCOUNTER — Other Ambulatory Visit: Payer: Self-pay

## 2021-05-22 ENCOUNTER — Ambulatory Visit (INDEPENDENT_AMBULATORY_CARE_PROVIDER_SITE_OTHER): Payer: Medicare Other | Admitting: Pharmacist

## 2021-05-22 ENCOUNTER — Ambulatory Visit (HOSPITAL_COMMUNITY)
Admission: RE | Admit: 2021-05-22 | Discharge: 2021-05-22 | Disposition: A | Discharge status: Home / Self Care | Payer: Medicare Other | Source: Ambulatory Visit | Attending: Family Medicine | Admitting: Family Medicine

## 2021-05-22 DIAGNOSIS — E119 Type 2 diabetes mellitus without complications: Secondary | ICD-10-CM | POA: Diagnosis not present

## 2021-05-22 DIAGNOSIS — Z780 Asymptomatic menopausal state: Secondary | ICD-10-CM | POA: Diagnosis not present

## 2021-05-22 DIAGNOSIS — Z794 Long term (current) use of insulin: Secondary | ICD-10-CM | POA: Diagnosis not present

## 2021-05-22 DIAGNOSIS — I10 Essential (primary) hypertension: Secondary | ICD-10-CM

## 2021-05-22 DIAGNOSIS — E6609 Other obesity due to excess calories: Secondary | ICD-10-CM

## 2021-05-22 DIAGNOSIS — E782 Mixed hyperlipidemia: Secondary | ICD-10-CM

## 2021-05-22 DIAGNOSIS — M8589 Other specified disorders of bone density and structure, multiple sites: Secondary | ICD-10-CM | POA: Diagnosis not present

## 2021-05-22 DIAGNOSIS — Z13820 Encounter for screening for osteoporosis: Secondary | ICD-10-CM | POA: Diagnosis not present

## 2021-05-22 DIAGNOSIS — E1165 Type 2 diabetes mellitus with hyperglycemia: Secondary | ICD-10-CM

## 2021-05-22 DIAGNOSIS — M8580 Other specified disorders of bone density and structure, unspecified site: Secondary | ICD-10-CM

## 2021-05-22 NOTE — Chronic Care Management (AMB) (Signed)
? ? ?Chronic Care Management ?Pharmacy Note ? ?05/22/2021 ?Name:  Stacey Hanson MRN:  [id number] DOB:  1945-06-29 ? ?Summary: ?General: ?Patient wants to use [facility] as primary pharmacy unless medication need is urgent. Pharmacy preference updated. ?Patient had DEXA scan today. Awaiting results.  ? ?Type 2 Diabetes ?Follows with endocrinology Rayetta Pigg, NP) ?Uncontrolled but improving; Most recent A1c improved 9.7%>8.2% but remains above goal of <7% per ADA guidelines ?Recent changes: endocrinology increased Tresiba to 58 units subcutaneously once daily ?Current glucose readings: patient verbally reports fasting blood glucose ranging from 110s-170s over last few weeks which is improved from prior ?Patient has received Dexcom G6 supplies from [facility] (prescribed by endocrinology). Patient anticipates that endocrinology will help her setup to use to more closely monitor glucose. She is currently apprehensive about trying a continuous glucose monitor as she prefers finger sticks at this time.  ?Continue metformin 500 mg by mouth once daily. Due to current renal function, unable to increase. Will need to discontinue if glomerular filtration rate <30 mL/min ?Continue semaglutide (Ozempic) 0.5 mg subcutaneously weekly. Endocrinology plans to assess dose increase at next visit which I think is appropriate.  ?Continue insulin degludec Tyler Aas) 58 units subcutaneously once daily for now. Continue to assess fasting blood glucose trends and adjust as needed ?Continue follow-up with endocrinology ? ?Hyperlipidemia ?LDL above goal of <70 due to very high risk given 10-year risk >20% per 2020 AACE/ACE guidelines. ?Continue pravastatin 40 mg by mouth once daily for now given ongoing changes to diabetes regimen. Do not want to cause confusion but will address elevated LDL at future visits. Will need to likely increase to high intensity statin.  ? ?Overweight/Obesity ?Baseline weight: 229 lbs; most recent  weight: 213 lbs ?Continue semaglutide (Ozempic) 0.5 mg subcutaneously weekly. Endocrinology plans to assess dose increase at next visit which I think is appropriate.  ? ?Subjective: ?Stacey Hanson is an 76 y.o. year old female who is a primary patient of Fayrene Helper, MD.  The CCM team was consulted for assistance with disease management and care coordination needs.   ? ?Engaged with patient by telephone for follow up visit in response to provider referral for pharmacy case management and/or care coordination services.  ? ?Consent to Services:  ?The patient was given information about Chronic Care Management services, agreed to services, and gave verbal consent prior to initiation of services.  Please see initial visit note for detailed documentation.  ? ?Patient Care Team: ?Fayrene Helper, MD as PCP - General ?Herminio Commons, MD (Inactive) as PCP - Cardiology (Cardiology) ?Fields, Marga Melnick, MD (Inactive) (Gastroenterology) ?Leta Baptist, MD as Attending Physician (Otolaryngology) ?Cassandria Anger, MD (Endocrinology) ?Carole Civil, MD as Consulting Physician (Orthopedic Surgery) ?Madelin Headings, DO (Optometry) ?Bernell List, CPhT as [employer], corporate) ?Eloise Harman, DO as Consulting Physician (Internal Medicine) ?Beryle Lathe, [facility] (Pharmacist) ? ?Objective: ? ?Lab Results  ?Component Value Date  ? CREATININE 1.39 (H) 12/28/2020  ? CREATININE 1.01 (H) 12/06/2020  ? CREATININE 1.21 (H) 08/12/2020  ? ? ?Lab Results  ?Component Value Date  ? HGBA1C 8.2 (A) 03/24/2021  ? ?Last diabetic Eye exam:  ?Lab Results  ?Component Value Date/Time  ? HMDIABEYEEXA No Retinopathy 05/21/2021 04:45 PM  ?  ?Last diabetic Foot exam:  ?Lab Results  ?Component Value Date/Time  ? HMDIABFOOTEX yes 03/18/2010 12:00 AM  ?  ? ?   ?Component Value Date/Time  ? CHOL 168 12/06/2020 0957  ?  TRIG 79 12/06/2020 0957  ? HDL 53 12/06/2020 0957  ? CHOLHDL 3.2  12/06/2020 0957  ? CHOLHDL 2.2 01/11/2016 0916  ? VLDL 11 01/11/2016 0916  ? [place] 100 (H) 12/06/2020 0957  ? ? ? ?  Latest Ref Rng & Units 12/28/2020  ? 10:01 PM 12/06/2020  ?  9:56 AM 08/12/2020  ?  9:43 AM  ?Hepatic Function  ?Total Protein 6.5 - 8.1 g/dL 7.7   7.6   7.9    ?Albumin 3.5 - 5.0 g/dL 4.0   4.6   4.7    ?AST 15 - 41 U/L _0 ?ALT 0 - 44 U/L _1 ?Alk Phosphatase 38 - 126 U/L 99   141   122    ?Total Bilirubin 0.3 - 1.2 mg/dL 0.6   0.3   0.4    ? ? ?Lab Results  ?Component Value Date/Time  ? TSH 0.328 (L) 12/06/2020 09:56 AM  ? TSH 0.307 (L) 08/12/2020 09:43 AM  ? FREET4 1.54 12/06/2020 09:56 AM  ? FREET4 1.42 08/12/2020 09:43 AM  ? ? ? ?  Latest Ref Rng & Units 12/28/2020  ? 10:01 PM 08/08/2018  ? 11:17 AM 02/09/2018  ?  2:39 PM  ?CBC  ?WBC 4.0 - 10.5 K/uL 10.3   7.0   8.7    ?Hemoglobin 12.0 - 15.0 g/dL 12.0   13.0   12.1    ?Hematocrit 36.0 - 46.0 % 36.2   37.5   36.9    ?Platelets 150 - 400 K/uL 403   451   548    ? ? ?Lab Results  ?Component Value Date/Time  ? VD25OH 37.3 12/06/2020 09:57 AM  ? VD25OH 50.5 11/06/2017 09:47 AM  ? ? ?Clinical ASCVD: No  ?The 10-year ASCVD risk score (Arnett DK, et al., 2019) is: 29.2% ?  Values used to calculate the score: ?    Age: 89 years ?    Sex: Female ?    Is Non-Hispanic African American: Yes ?    Diabetic: Yes ?    Tobacco smoker: No ?    Systolic Blood Pressure: 920 mmHg ?    Is BP treated: Yes ?    HDL Cholesterol: 53 mg/dL ?    Total Cholesterol: 168 mg/dL   ? ?Social History  ? ?Tobacco Use  ?Smoking Status Former  ? Packs/day: 0.25  ? Years: 10.00  ? Pack years: 2.50  ? Types: Cigarettes  ? Quit date: 02/20/1985  ? Years since quitting: 36.2  ?Smokeless Tobacco Never  ? ?BP Readings from Last 3 Encounters:  ?05/08/21 131/84  ?03/24/21 126/75  ?03/12/21 130/70  ? ?Pulse Readings from Last 3 Encounters:  ?05/08/21 90  ?03/24/21 96  ?03/12/21 92  ? ?Wt Readings from Last 3 Encounters:  ?05/08/21 213 lb (96.6 kg)  ?03/24/21 217 lb 3.2  oz (98.5 kg)  ?03/12/21 221 lb 0.6 oz (100.3 kg)  ? ? ?Assessment: Review of patient past medical history, allergies, medications, health status, including review of consultants reports, laboratory and other test data, was performed as part of comprehensive evaluation and provision of chronic care management services.  ? ?SDOH:  (Social Determinants of Health) assessments and interventions performed:  ? ? ?CCM Care Plan ? ?Allergies  ?Allergen Reactions  ? Simvastatin Other (See Comments)  ?  Muscle cramps  ? ? ?Medications Reviewed Today   ? ? Reviewed by  Beryle Lathe, [facility] (Pharmacist) on 05/22/21 at 1551  Med List Status: <None>  ? ?Medication Order Taking? Sig Documenting Provider Last Dose Status Informant  ?alendronate (FOSAMAX) 70 MG tablet [id number] Yes Take 1 tablet (70 mg total) by mouth every 7 (seven) days. Take with a full glass of water on an empty stomach. Fayrene Helper, MD Taking Active   ?amLODipine-benazepril (LOTREL) 5-40 MG capsule [id number] Yes Take 1 capsule by mouth daily. Fayrene Helper, MD Taking Active   ?aspirin EC 81 MG tablet [id number] Yes Take 81 mg by mouth daily. [provider] Taking Active Self  ?B-D UF III MINI PEN NEEDLES 31G X 5 MM MISC [id number] Yes USE FOR TWICE DAILY INJECTIONS OF Lendon Ka, MD Taking Active Self  ?Blood Glucose Monitoring Suppl (ONE TOUCH ULTRA 2) w/Device KIT [id number] Yes Twice daily testing dx e11.65 Fayrene Helper, MD Taking Active Self  ?Calcium Carb-Cholecalciferol (CALCIUM 600/VITAMIN D3 PO) [id number] Yes Take 2 tablets by mouth daily. [provider] Taking Active Self  ?Continuous Blood Gluc Receiver (DEXCOM G6 RECEIVER) DEVI [id number] No USE TO CHECK BLOOD GLUCOSE 4 TIMES DAILY.  ?Patient not taking: Reported on 05/22/2021  ? [provider] Not Taking Active Self  ?Continuous Blood Gluc Sensor (DEXCOM G6 SENSOR) MISC [id number] No SMARTSIG:1 Topical Every 10 Days  ?Patient not  taking: Reported on 05/22/2021  ? [provider] Not Taking Active Self  ?Continuous Blood Gluc Transmit (DEXCOM G6 TRANSMITTER) MISC [id number] No USE 1 TRANSMITTER FOR 90 DAYS.  ?Patient not taking: Rep

## 2021-05-22 NOTE — Patient Instructions (Addendum)
[name], ? ?It was great to talk to you today! ? ?Please call me with any questions or concerns. ? ?Visit Information ? ?Following are the goals we discussed today:  ? Goals Addressed   ? ?  ?  ?  ?  ? This Visit's Progress  ?  Medication Management     ?  Patient Goals/Self-Care Activities ?Patient will:  ?Focus on medication adherence by keeping up with prescription refills and either using a pill box or reminders to take your medications at the prescribed times ?Check blood sugar twice a day at the following times: fasting (at least 8 hours since last food consumption), 5-15 minutes before dinner, and whenever patient experiences symptoms of hypo/hyperglycemia, document, and provide at future appointments ?Collaborate with provider on medication access solutions ?Engage in dietary modifications by fewer sweetened foods & beverages ? ? ? ?  ? ?  ?  ? ?Follow-up plan: Next PCP appointment scheduled for: 06/13/21 ? ?The patient verbalized understanding of instructions, educational materials, and care plan provided today and declined offer to receive copy of patient instructions, educational materials, and care plan.  ? ?Please call the care guide team at [phone] if you need to cancel or reschedule your appointment.  ? ?Kennon Holter, PharmD, BCACP, CPP ?Clinical Pharmacist Practitioner ?[place] ?[phone]  ?

## 2021-05-27 ENCOUNTER — Other Ambulatory Visit: Payer: Self-pay

## 2021-05-27 ENCOUNTER — Telehealth: Payer: Self-pay | Admitting: Family Medicine

## 2021-05-27 DIAGNOSIS — E1165 Type 2 diabetes mellitus with hyperglycemia: Secondary | ICD-10-CM

## 2021-05-27 MED ORDER — AMLODIPINE BESY-BENAZEPRIL HCL 5-40 MG PO CAPS: 1.0000 | ORAL_CAPSULE | Freq: Every day | ORAL | 5 refills | Status: DC

## 2021-05-27 MED ORDER — LEVOTHYROXINE SODIUM 88 MCG PO TABS: ORAL_TABLET | ORAL | 1 refills | Status: DC

## 2021-05-27 MED ORDER — METFORMIN HCL 500 MG PO TABS: ORAL_TABLET | ORAL | 0 refills | Status: DC

## 2021-05-27 NOTE — Telephone Encounter (Signed)
[facility]  ?

## 2021-05-27 NOTE — Telephone Encounter (Signed)
Patient wants to change pharm from [facility]  ? ?Patient wants all new meds and refills to go to [facility] from now on .  ?

## 2021-05-30 DIAGNOSIS — I10 Essential (primary) hypertension: Secondary | ICD-10-CM

## 2021-05-30 DIAGNOSIS — E1165 Type 2 diabetes mellitus with hyperglycemia: Secondary | ICD-10-CM

## 2021-05-30 DIAGNOSIS — E782 Mixed hyperlipidemia: Secondary | ICD-10-CM

## 2021-06-03 ENCOUNTER — Other Ambulatory Visit: Payer: Self-pay

## 2021-06-11 ENCOUNTER — Ambulatory Visit (INDEPENDENT_AMBULATORY_CARE_PROVIDER_SITE_OTHER): Payer: Medicare Other | Admitting: Family Medicine

## 2021-06-11 ENCOUNTER — Other Ambulatory Visit: Payer: Self-pay

## 2021-06-11 ENCOUNTER — Encounter: Payer: Self-pay | Admitting: Family Medicine

## 2021-06-11 VITALS — BP 121/74 | HR 110 | Ht 67.0 in | Wt 212.0 lb

## 2021-06-11 DIAGNOSIS — E890 Postprocedural hypothyroidism: Secondary | ICD-10-CM | POA: Diagnosis not present

## 2021-06-11 DIAGNOSIS — E7800 Pure hypercholesterolemia, unspecified: Secondary | ICD-10-CM | POA: Diagnosis not present

## 2021-06-11 DIAGNOSIS — E559 Vitamin D deficiency, unspecified: Secondary | ICD-10-CM | POA: Diagnosis not present

## 2021-06-11 DIAGNOSIS — Z1231 Encounter for screening mammogram for malignant neoplasm of breast: Secondary | ICD-10-CM

## 2021-06-11 DIAGNOSIS — E1159 Type 2 diabetes mellitus with other circulatory complications: Secondary | ICD-10-CM

## 2021-06-11 DIAGNOSIS — K219 Gastro-esophageal reflux disease without esophagitis: Secondary | ICD-10-CM

## 2021-06-11 DIAGNOSIS — I10 Essential (primary) hypertension: Secondary | ICD-10-CM

## 2021-06-11 MED ORDER — UNABLE TO FIND: 0 refills | Status: DC

## 2021-06-11 NOTE — Assessment & Plan Note (Signed)
Controlled, no change in medication  

## 2021-06-11 NOTE — Assessment & Plan Note (Signed)
Not at goal, mx per endo ?Ms. Lonon is reminded of the importance of commitment to daily physical activity for 30 minutes or more, as able and the need to limit carbohydrate intake to 30 to 60 grams per meal to help with blood sugar control.  ? ?The need to take medication as prescribed, test blood sugar as directed, and to call between visits if there is a concern that blood sugar is uncontrolled is also discussed.  ? ?Ms. Mealing is reminded of the importance of daily foot exam, annual eye examination, and good blood sugar, blood pressure and cholesterol control. ? ? ?  Latest Ref Rng & Units 03/24/2021  ?  1:35 PM 12/28/2020  ? 10:01 PM 12/19/2020  ?  1:16 PM 12/06/2020  ?  9:57 AM 12/06/2020  ?  9:56 AM  ?Diabetic Labs  ?HbA1c 0.0 - 7.0 % 8.2    9.7      ?Chol 100 - 199 mg/dL    168     ?HDL >39 mg/dL    53     ?Calc LDL 0 - 99 mg/dL    100     ?Triglycerides 0 - 149 mg/dL    79     ?Creatinine 0.44 - 1.00 mg/dL  1.39     1.01    ? ? ?  06/11/2021  ? 11:14 AM 05/08/2021  ?  1:34 PM 03/24/2021  ?  1:20 PM 03/12/2021  ? 10:44 AM 03/12/2021  ?  9:59 AM 12/29/2020  ?  1:32 AM 12/29/2020  ?  1:30 AM  ?BP/Weight  ?Systolic BP 979 892 119 417 157 113 113  ?Diastolic BP 74 84 75 70 82 63 63  ?Wt. (Lbs) 212.04 213 217.2  221.04    ?BMI 33.21 kg/m2 33.36 kg/m2 34.02 kg/m2  34.62 kg/m2    ? ? ?  Latest Ref Rng & Units 06/11/2021  ? 11:20 AM 05/21/2021  ?  4:45 PM  ?Foot/eye exam completion dates  ?Eye Exam No Retinopathy  No Retinopathy       ?Foot Form Completion  Done   ?  ? This result is from an external source.  ? ? ? ? ? ?

## 2021-06-11 NOTE — Patient Instructions (Addendum)
F/U in end August , flu vaccine at visit ? ?All meds to mail order please, home del;ivery ? ?Fasting lipid, CBc and vit D in June when getting lab for Dr Dorris Fetch ? ?Foot exam today qualifies you for shoes , we will send to [place] ? ?Excellent BP ? ?Great work with vaccines ? ?Shingrix #2 this month please ? ?Thanks for choosing [facility], we consider it a privelige to serve you. ? ?No more falls ? ?Please schedule mammogrammin June at checkout  ?

## 2021-06-11 NOTE — Assessment & Plan Note (Signed)
Managed by endo, controlled on current regime has updated labs in next 2 months ?

## 2021-06-11 NOTE — Progress Notes (Signed)
? ?Stacey Hanson     MRN: [id number]      DOB: Jul 08, 1945 ? ? ?HPI ?Stacey Hanson is here for follow up and re-evaluation of chronic medical conditions, medication management and review of any available recent lab and radiology data.  ?Preventive health is updated, specifically  Cancer screening and Immunization.   ?Questions or concerns regarding consultations or procedures which the PT has had in the interim are  addressed. ?Wants to get rx for diabetic shoes ?Does not want new testing system for blood sugar, will discuss with Endo ?ROS ?Denies recent fever or chills. ?Denies sinus pressure, nasal congestion, ear pain or sore throat. ?Denies chest congestion, productive cough or wheezing. ?Denies chest pains, palpitations and leg swelling ?Denies abdominal pain, nausea, vomiting,diarrhea or constipation.   ?Denies dysuria, frequency, hesitancy or incontinence. ?Denies uncontrolled joint pain, [place]/o d limitatation in movement ?Denies depression, anxiety or insomnia. ?Denies skin break down or rash. ? ? ?PE ? ?BP 121/74   Pulse (!) 110   Ht '5\' 7"'$  (1.702 m)   Wt 212 lb 0.6 oz (96.2 kg)   SpO2 95%   BMI 33.21 kg/m?  ? ?Patient alert and oriented and in no cardiopulmonary distress. ? ?HEENT: No facial asymmetry, EOMI,     Neck supple . ? ?Chest: Clear to auscultation bilaterally. ? ?CVS: S1, S2 no murmurs, no S3.Regular rate. ? ?ABD: Soft non tender.  ? ?Ext: No edema ? ?MS: decreased ROM spine, shoulders, hips and knees. ? ?Skin: Intact, no ulcerations or rash noted. ? ?Psych: Good eye contact, normal affect. Memory intact not anxious or depressed appearing. ? ?CNS: CN 2-12 intact, power,  normal throughout.no focal deficits noted. ? ? ?Assessment & Plan ? ?Type 2 diabetes mellitus with vascular disease ([place]) ?Not at goal, mx per endo ?Stacey Hanson is reminded of the importance of commitment to daily physical activity for 30 minutes or more, as able and the need to limit carbohydrate intake to 30 to 60 grams per meal  to help with blood sugar control.  ? ?The need to take medication as prescribed, test blood sugar as directed, and to call between visits if there is a concern that blood sugar is uncontrolled is also discussed.  ? ?Stacey Hanson is reminded of the importance of daily foot exam, annual eye examination, and good blood sugar, blood pressure and cholesterol control. ? ? ?  Latest Ref Rng & Units 03/24/2021  ?  1:35 PM 12/28/2020  ? 10:01 PM 12/19/2020  ?  1:16 PM 12/06/2020  ?  9:57 AM 12/06/2020  ?  9:56 AM  ?Diabetic Labs  ?HbA1c 0.0 - 7.0 % 8.2    9.7      ?Chol 100 - 199 mg/dL    168     ?HDL >39 mg/dL    53     ?Calc LDL 0 - 99 mg/dL    100     ?Triglycerides 0 - 149 mg/dL    79     ?Creatinine 0.44 - 1.00 mg/dL  1.39     1.01    ? ? ?  06/11/2021  ? 11:14 AM 05/08/2021  ?  1:34 PM 03/24/2021  ?  1:20 PM 03/12/2021  ? 10:44 AM 03/12/2021  ?  9:59 AM 12/29/2020  ?  1:32 AM 12/29/2020  ?  1:30 AM  ?BP/Weight  ?Systolic BP 952 841 324 401 157 113 113  ?Diastolic BP 74 84 75 70 82 63 63  ?Wt. (Lbs) 212.04 213  217.2  221.04    ?BMI 33.21 kg/m2 33.36 kg/m2 34.02 kg/m2  34.62 kg/m2    ? ? ?  Latest Ref Rng & Units 06/11/2021  ? 11:20 AM 05/21/2021  ?  4:45 PM  ?Foot/eye exam completion dates  ?Eye Exam No Retinopathy  No Retinopathy       ?Foot Form Completion  Done   ?  ? This result is from an external source.  ? ? ? ? ? ? ?Hypothyroidism following radioiodine therapy ?Managed by endo, controlled on current regime has updated labs in next 2 months ? ?Essential (primary) hypertension ?DASH diet and commitment to daily physical activity for a minimum of 30 minutes discussed and encouraged, as a part of hypertension management. ?The importance of attaining a healthy weight is also discussed. ? ? ?  06/11/2021  ? 11:14 AM 05/08/2021  ?  1:34 PM 03/24/2021  ?  1:20 PM 03/12/2021  ? 10:44 AM 03/12/2021  ?  9:59 AM 12/29/2020  ?  1:32 AM 12/29/2020  ?  1:30 AM  ?BP/Weight  ?Systolic BP 096 045 409 811 157 113 113  ?Diastolic BP 74 84 75 70 82  63 63  ?Wt. (Lbs) 212.04 213 217.2  221.04    ?BMI 33.21 kg/m2 33.36 kg/m2 34.02 kg/m2  34.62 kg/m2    ? ? ? ?Controlled, no change in medication ? ? ?Gastroesophageal reflux disease ?Controlled, no change in medication ? ? ?

## 2021-06-11 NOTE — Assessment & Plan Note (Signed)
DASH diet and commitment to daily physical activity for a minimum of 30 minutes discussed and encouraged, as a part of hypertension management. ?The importance of attaining a healthy weight is also discussed. ? ? ?  06/11/2021  ? 11:14 AM 05/08/2021  ?  1:34 PM 03/24/2021  ?  1:20 PM 03/12/2021  ? 10:44 AM 03/12/2021  ?  9:59 AM 12/29/2020  ?  1:32 AM 12/29/2020  ?  1:30 AM  ?BP/Weight  ?Systolic BP 206 015 615 379 157 113 113  ?Diastolic BP 74 84 75 70 82 63 63  ?Wt. (Lbs) 212.04 213 217.2  221.04    ?BMI 33.21 kg/m2 33.36 kg/m2 34.02 kg/m2  34.62 kg/m2    ? ? ? ?Controlled, no change in medication ? ?

## 2021-06-12 ENCOUNTER — Other Ambulatory Visit: Payer: Self-pay

## 2021-06-12 DIAGNOSIS — E1165 Type 2 diabetes mellitus with hyperglycemia: Secondary | ICD-10-CM

## 2021-06-12 MED ORDER — METFORMIN HCL 500 MG PO TABS: ORAL_TABLET | ORAL | 1 refills | Status: DC

## 2021-06-12 MED ORDER — AMLODIPINE BESY-BENAZEPRIL HCL 5-40 MG PO CAPS: 1.0000 | ORAL_CAPSULE | Freq: Every day | ORAL | 1 refills | Status: DC

## 2021-06-23 ENCOUNTER — Ambulatory Visit: Payer: Medicare Other | Admitting: Nurse Practitioner

## 2021-06-24 ENCOUNTER — Other Ambulatory Visit: Payer: Self-pay

## 2021-06-24 MED ORDER — BD PEN NEEDLE MINI U/F 31G X 5 MM MISC: 6 refills | Status: DC

## 2021-07-08 DIAGNOSIS — M2140 Flat foot [pes planus] (acquired), unspecified foot: Secondary | ICD-10-CM | POA: Diagnosis not present

## 2021-07-08 DIAGNOSIS — E119 Type 2 diabetes mellitus without complications: Secondary | ICD-10-CM | POA: Diagnosis not present

## 2021-07-16 DIAGNOSIS — M79675 Pain in left toe(s): Secondary | ICD-10-CM | POA: Diagnosis not present

## 2021-07-16 DIAGNOSIS — M79672 Pain in left foot: Secondary | ICD-10-CM | POA: Diagnosis not present

## 2021-07-16 DIAGNOSIS — E1151 Type 2 diabetes mellitus with diabetic peripheral angiopathy without gangrene: Secondary | ICD-10-CM | POA: Diagnosis not present

## 2021-07-16 DIAGNOSIS — M79671 Pain in right foot: Secondary | ICD-10-CM | POA: Diagnosis not present

## 2021-07-16 DIAGNOSIS — M79674 Pain in right toe(s): Secondary | ICD-10-CM | POA: Diagnosis not present

## 2021-08-04 DIAGNOSIS — E890 Postprocedural hypothyroidism: Secondary | ICD-10-CM | POA: Diagnosis not present

## 2021-08-04 DIAGNOSIS — E7800 Pure hypercholesterolemia, unspecified: Secondary | ICD-10-CM | POA: Diagnosis not present

## 2021-08-04 DIAGNOSIS — E1165 Type 2 diabetes mellitus with hyperglycemia: Secondary | ICD-10-CM | POA: Diagnosis not present

## 2021-08-04 DIAGNOSIS — E1159 Type 2 diabetes mellitus with other circulatory complications: Secondary | ICD-10-CM | POA: Diagnosis not present

## 2021-08-04 DIAGNOSIS — E559 Vitamin D deficiency, unspecified: Secondary | ICD-10-CM | POA: Diagnosis not present

## 2021-08-05 LAB — COMPREHENSIVE METABOLIC PANEL
ALT: 15 IU/L (ref 0–32)
AST: 19 IU/L (ref 0–40)
Albumin/Globulin Ratio: 1.4 (ref 1.2–2.2)
Albumin: 4.6 g/dL (ref 3.7–4.7)
Alkaline Phosphatase: 99 IU/L (ref 44–121)
BUN/Creatinine Ratio: 20 (ref 12–28)
BUN: 22 mg/dL (ref 8–27)
Bilirubin Total: 0.3 mg/dL (ref 0.0–1.2)
CO2: 19 mmol/L — ABNORMAL LOW (ref 20–29)
Calcium: 9.9 mg/dL (ref 8.7–10.3)
Chloride: 97 mmol/L (ref 96–106)
Creatinine, Ser: 1.1 mg/dL — ABNORMAL HIGH (ref 0.57–1.00)
Globulin, Total: 3.4 g/dL (ref 1.5–4.5)
Glucose: 211 mg/dL — ABNORMAL HIGH (ref 70–99)
Potassium: 4.7 mmol/L (ref 3.5–5.2)
Sodium: 136 mmol/L (ref 134–144)
Total Protein: 8 g/dL (ref 6.0–8.5)
eGFR: 52 mL/min/{1.73_m2} — ABNORMAL LOW (ref >59)

## 2021-08-05 LAB — CBC
Hematocrit: 37.5 % (ref 34.0–46.6)
Hemoglobin: 13.2 g/dL (ref 11.1–15.9)
MCH: 31.1 pg (ref 26.6–33.0)
MCHC: 35.2 g/dL (ref 31.5–35.7)
MCV: 88 fL (ref 79–97)
Platelets: 460 10*3/uL — ABNORMAL HIGH (ref 150–450)
RBC: 4.24 x10E6/uL (ref 3.77–5.28)
RDW: 12.4 % (ref 11.7–15.4)
WBC: 7.6 10*3/uL (ref 3.4–10.8)

## 2021-08-05 LAB — LIPID PANEL
Chol/HDL Ratio: 3 ratio (ref 0.0–4.4)
Cholesterol, Total: 179 mg/dL (ref 100–199)
HDL: 59 mg/dL (ref >39)
LDL Chol Calc (NIH): 103 mg/dL — ABNORMAL HIGH (ref 0–99)
Triglycerides: 93 mg/dL (ref 0–149)
VLDL Cholesterol Cal: 17 mg/dL (ref 5–40)

## 2021-08-05 LAB — TSH: TSH: 1.57 u[IU]/mL (ref 0.450–4.500)

## 2021-08-05 LAB — VITAMIN D 25 HYDROXY (VIT D DEFICIENCY, FRACTURES): Vit D, 25-Hydroxy: 41.9 ng/mL (ref 30.0–100.0)

## 2021-08-05 LAB — T4, FREE: Free T4: 1.37 ng/dL (ref 0.82–1.77)

## 2021-08-11 ENCOUNTER — Ambulatory Visit: Payer: Medicare Other | Admitting: Nurse Practitioner

## 2021-08-12 ENCOUNTER — Encounter: Payer: Self-pay | Admitting: Nurse Practitioner

## 2021-08-12 ENCOUNTER — Ambulatory Visit: Payer: Medicare Other | Admitting: Nurse Practitioner

## 2021-08-12 VITALS — BP 118/74 | HR 91 | Ht 67.0 in | Wt 211.0 lb

## 2021-08-12 DIAGNOSIS — I10 Essential (primary) hypertension: Secondary | ICD-10-CM

## 2021-08-12 DIAGNOSIS — E890 Postprocedural hypothyroidism: Secondary | ICD-10-CM

## 2021-08-12 DIAGNOSIS — E1165 Type 2 diabetes mellitus with hyperglycemia: Secondary | ICD-10-CM

## 2021-08-12 DIAGNOSIS — E782 Mixed hyperlipidemia: Secondary | ICD-10-CM

## 2021-08-12 LAB — POCT GLYCOSYLATED HEMOGLOBIN (HGB A1C): HbA1c POC (<> result, manual entry): 9.9 % (ref 4.0–5.6)

## 2021-08-12 NOTE — Patient Instructions (Signed)
Diabetes Mellitus and Foot Care Foot care is an important part of your health, especially when you have diabetes. Diabetes may cause you to have problems because of poor blood flow (circulation) to your feet and legs, which can cause your skin to: Become thinner and drier. Break more easily. Heal more slowly. Peel and crack. You may also have nerve damage (neuropathy) in your legs and feet, causing decreased feeling in them. This means that you may not notice minor injuries to your feet that could lead to more serious problems. Noticing and addressing any potential problems early is the best way to prevent future foot problems. How to care for your feet Foot hygiene  Wash your feet daily with warm water and mild soap. Do not use hot water. Then, pat your feet and the areas between your toes until they are completely dry. Do not soak your feet as this can dry your skin. Trim your toenails straight across. Do not dig under them or around the cuticle. File the edges of your nails with an emery board or nail file. Apply a moisturizing lotion or petroleum jelly to the skin on your feet and to dry, brittle toenails. Use lotion that does not contain alcohol and is unscented. Do not apply lotion between your toes. Shoes and socks Wear clean socks or stockings every day. Make sure they are not too tight. Do not wear knee-high stockings since they may decrease blood flow to your legs. Wear shoes that fit properly and have enough cushioning. Always look in your shoes before you put them on to be sure there are no objects inside. To break in new shoes, wear them for just a few hours a day. This prevents injuries on your feet. Wounds, scrapes, corns, and calluses  Check your feet daily for blisters, cuts, bruises, sores, and redness. If you cannot see the bottom of your feet, use a mirror or ask someone for help. Do not cut corns or calluses or try to remove them with medicine. If you find a minor scrape,  cut, or break in the skin on your feet, keep it and the skin around it clean and dry. You may clean these areas with mild soap and water. Do not clean the area with peroxide, alcohol, or iodine. If you have a wound, scrape, corn, or callus on your foot, look at it several times a day to make sure it is healing and not infected. Check for: Redness, swelling, or pain. Fluid or blood. Warmth. Pus or a bad smell. General tips Do not cross your legs. This may decrease blood flow to your feet. Do not use heating pads or hot water bottles on your feet. They may burn your skin. If you have lost feeling in your feet or legs, you may not know this is happening until it is too late. Protect your feet from hot and cold by wearing shoes, such as at the beach or on hot pavement. Schedule a complete foot exam at least once a year (annually) or more often if you have foot problems. Report any cuts, sores, or bruises to your health care provider immediately. Where to find more information American Diabetes Association: www.diabetes.org Association of Diabetes Care & Education Specialists: www.diabeteseducator.org Contact a health care provider if: You have a medical condition that increases your risk of infection and you have any cuts, sores, or bruises on your feet. You have an injury that is not healing. You have redness on your legs or feet. You   feel burning or tingling in your legs or feet. You have pain or cramps in your legs and feet. Your legs or feet are numb. Your feet always feel cold. You have pain around any toenails. Get help right away if: You have a wound, scrape, corn, or callus on your foot and: You have pain, swelling, or redness that gets worse. You have fluid or blood coming from the wound, scrape, corn, or callus. Your wound, scrape, corn, or callus feels warm to the touch. You have pus or a bad smell coming from the wound, scrape, corn, or callus. You have a fever. You have a red  line going up your leg. Summary Check your feet every day for blisters, cuts, bruises, sores, and redness. Apply a moisturizing lotion or petroleum jelly to the skin on your feet and to dry, brittle toenails. Wear shoes that fit properly and have enough cushioning. If you have foot problems, report any cuts, sores, or bruises to your health care provider immediately. Schedule a complete foot exam at least once a year (annually) or more often if you have foot problems. This information is not intended to replace advice given to you by your health care provider. Make sure you discuss any questions you have with your health care provider. Document Revised: 09/07/2019 Document Reviewed: 09/07/2019 Elsevier Patient Education  2023 [employer]  

## 2021-08-12 NOTE — Progress Notes (Signed)
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    Knee pain    Obesity    Past Surgical History:  Procedure Laterality Date   ABDOMINAL HYSTERECTOMY  03/02/1968   CATARACT EXTRACTION W/PHACO Left 03/09/2019   Procedure: CATARACT EXTRACTION PHACO AND INTRAOCULAR LENS PLACEMENT ([place]);  Surgeon: Baruch Goldmann, MD;  Location: [facility];  Service: Ophthalmology;  Laterality: Left;  CDE: 9.08   CATARACT EXTRACTION W/PHACO Right 03/27/2019   Procedure: CATARACT EXTRACTION PHACO AND INTRAOCULAR LENS PLACEMENT RIGHT EYE;  Surgeon: Baruch Goldmann, MD;  Location: [facility];  Service: Ophthalmology;  Laterality: Right;  CDE: 7.47   CHOLECYSTECTOMY  OCT 2010 BZ BILIARY DYSKINESIA   CHRONIC CHOLECYSTITIS   COLONOSCOPY  MAY 2009 SCREENING   [facility]TC TICS, [facility]   COLONOSCOPY N/A 08/11/2017   Procedure: COLONOSCOPY;  Surgeon: Danie Binder, MD;  Location: [facility];  Service: Endoscopy;  Laterality: N/A;  9:30am   ESOPHAGOGASTRODUODENOSCOPY  10/03/2010   Procedure: ESOPHAGOGASTRODUODENOSCOPY (EGD);  Surgeon: Dorothyann Peng, MD;  Location: [facility];  Service: Endoscopy;  Laterality: N/A;   ESOPHAGOGASTRODUODENOSCOPY  12/19/2002   RMR: Normal esophagus/Normal stomach/ Duodenum large bulbar diverticulum, 1 cm bulbar ulcer with surrounding/ inflammation as described above. Normal D2   FOOT SURGERY Left 01/29/2021   POLYPECTOMY  08/11/2017   Procedure: POLYPECTOMY;  Surgeon: Danie Binder, MD;  Location: [facility];  Service: Endoscopy;;  cecal     SAVORY DILATION  10/03/2010   Procedure: SAVORY DILATION;  Surgeon: Dorothyann Peng, MD;  Location: [facility];  Service: Endoscopy;;   TOTAL ABDOMINAL HYSTERECTOMY  03/02/1968   TOTAL KNEE ARTHROPLASTY Left 12/21/2017   Procedure: LEFT TOTAL KNEE ARTHROPLASTY;  Surgeon: Paralee Cancel, MD;  Location: [facility];  Service: Orthopedics;  Laterality: Left;  70 mins block   UPPER GASTROINTESTINAL ENDOSCOPY  OCT 2004 RMR   DUO TIC, & ULCER   Social History   Socioeconomic History   Marital status: Single    Spouse name: Not on file   Number of children: 3   Years of education: 10   Highest education level: 10th grade  Occupational History   Occupation: [employer]-OR   Occupation: Retired   Tobacco Use   Smoking status: Former    Packs/day: 0.25    Years: 10.00    Total pack years: 2.50    Types: Cigarettes    Quit date: 02/20/1985    Years since quitting: 36.4   Smokeless tobacco: Never  Vaping Use   Vaping Use: Never used  Substance and Sexual Activity   Alcohol use: No    Comment: stopped drinking 1998   Drug use: No   Sexual activity: Not Currently  Other Topics Concern   Not on file  Social History Narrative   Lives alone and she retired from [employer] Strain: Medium Risk (03/19/2021)   Overall Financial  Resource Strain (CARDIA)    Difficulty of Paying Living Expenses: Somewhat hard  Food Insecurity: No Food Insecurity (02/25/2021)   Hunger Vital Sign    Worried About Running Out of Food in the Last Year: Never true    Ran Out of Food in the Last Year: Never true  Transportation Needs: No Transportation Needs (03/19/2021)   PRAPARE - Hydrologist (Medical): No    Lack of Transportation (Non-Medical): No  Physical Activity: Inactive (02/25/2021)   Exercise Vital Sign    Days of Exercise per Week: 0 days    Minutes of Exercise per Session: 0 min  Stress: No Stress Concern  Present (02/25/2021)   [place]    Feeling of Stress : Only a little  Social Connections: Moderately Isolated (02/25/2021)   Social Connection and Isolation Panel [NHANES]    Frequency of Communication with Friends and Family: More than three times a week    Frequency of Social Gatherings with Friends and Family: More than three times a week    Attends Religious Services: Never    Marine scientist or Organizations: Yes    Attends Archivist Meetings: 1 to 4 times per year    Marital Status: Never married   Outpatient Encounter Medications as of 08/12/2021  Medication Sig   alendronate (FOSAMAX) 70 MG tablet Take 1 tablet (70 mg total) by mouth every 7 (seven) days. Take with a full glass of water on an empty stomach.   amLODipine-benazepril (LOTREL) 5-40 MG capsule Take 1 capsule by mouth daily.   aspirin EC 81 MG tablet Take 81 mg by mouth daily.   Blood Glucose Monitoring Suppl (ONE TOUCH ULTRA 2) w/Device KIT Twice daily testing dx e11.65   Calcium Carb-Cholecalciferol (CALCIUM 600/VITAMIN D3 PO) Take 2 tablets by mouth daily.   glucose blood (ONETOUCH ULTRA) test strip USE TO CHECK BLOOD SUGAR TWICE DAILY.   hydrochlorothiazide (HYDRODIURIL) 25 MG tablet TAKE 1 TABLET(25 MG) BY MOUTH DAILY   insulin degludec (TRESIBA FLEXTOUCH) 100 UNIT/ML FlexTouch Pen Inject 58 Units into the skin at bedtime.   Insulin Pen Needle (B-D UF III MINI PEN NEEDLES) 31G X 5 MM MISC USE FOR TWICE DAILY INJECTIONS OF HUMALOG   Lancets (ONETOUCH DELICA PLUS [id number]) MISC USE TWICE DAILY TO CHECK BLOOD SUGAR.   levothyroxine (SYNTHROID) 88 MCG tablet TAKE 1 TABLET(88 MCG) BY MOUTH DAILY BEFORE AND BREAKFAST   Multiple Vitamin (MULTIVITAMIN) capsule Take 1 capsule by mouth daily.   omeprazole (PRILOSEC) 20 MG capsule Take 1 capsule (20 mg total) by mouth every morning.   polyethylene glycol (MIRALAX / GLYCOLAX) 17 g packet Take  17 g by mouth as needed.   pravastatin (PRAVACHOL) 40 MG tablet TAKE 1 TABLET(40 MG) BY MOUTH DAILY   psyllium (METAMUCIL) 58.6 % packet Take 1 packet by mouth as needed.   Semaglutide,0.25 or 0.5MG/DOS, (OZEMPIC, 0.25 OR 0.5 MG/DOSE,) 2 MG/1.5ML SOPN Inject 0.5 mg into the skin once a week. Inject 0.25 mg SQ weekly x 2 weeks, then advance to 0.5 mg SQ weekly thereafter if tolerated well   UNABLE TO FIND Diabetic shoes x 1 pair, Inserts x 3 pair DX E11.9   [DISCONTINUED] Continuous Blood Gluc Receiver (DEXCOM G6 RECEIVER) DEVI USE TO CHECK BLOOD GLUCOSE 4 TIMES DAILY. (Patient not taking: Reported on 05/22/2021)   [DISCONTINUED] Continuous Blood Gluc Sensor (DEXCOM G6 SENSOR) MISC SMARTSIG:1 Topical Every 10 Days (Patient not  taking: Reported on 05/22/2021)   [DISCONTINUED] Continuous Blood Gluc Transmit (DEXCOM G6 TRANSMITTER) MISC USE 1 TRANSMITTER FOR 90 DAYS. (Patient not taking: Reported on 05/22/2021)   [DISCONTINUED] Insulin Pen Needle (B-D ULTRAFINE III SHORT PEN) 31G X 8 MM MISC USE TO INJECT INSULIN TWICE DAILY   [DISCONTINUED] metFORMIN (GLUCOPHAGE) 500 MG tablet TAKE 1 TABLET(500MG) BY MOUTH DAILY WITH A MEAL.   [DISCONTINUED] MODERNA COVID-19 VACCINE 100 MCG/0.5ML injection    [DISCONTINUED] UNABLE TO FIND Diabetic shoes x 1 inserts x 3  Dx E11.9   No facility-administered encounter medications on file as of 08/12/2021.   ALLERGIES: Allergies  Allergen Reactions   Simvastatin Other (See Comments)    Muscle cramps   VACCINATION STATUS: Immunization History  Administered Date(s) Administered   Fluad Quad(high Dose 65+) 11/23/2018, 12/21/2019, 12/10/2020   Influenza,inj,Quad PF,6+ Mos 11/16/2012, 11/15/2013, 11/14/2014, 11/14/2015, 11/03/2016, 11/09/2017   Influenza,inj,quad, With Preservative 11/02/2016   Moderna SARS-COV2 Booster Vaccination 07/24/2020, 05/26/2021   Moderna Sars-Covid-2 Vaccination 04/27/2019, 05/26/2019, 01/08/2020   Pneumococcal Conjugate-13 10/11/2013    Pneumococcal Polysaccharide-23 10/07/2011   Tdap 12/16/2020   Zoster Recombinat (Shingrix) 12/16/2020   Zoster, Live 04/05/2006    Hypertension This is a chronic problem. The current episode started more than 1 year ago. The problem has been resolved since onset. The problem is controlled. Pertinent negatives include no blurred vision, headaches, palpitations, shortness of breath or sweats. Agents associated with hypertension include thyroid hormones. Risk factors for coronary artery disease include diabetes mellitus, dyslipidemia, obesity, sedentary lifestyle and smoking/tobacco exposure. Past treatments include ACE inhibitors, diuretics and calcium channel blockers. The current treatment provides mild improvement. There are no compliance problems.  Hypertensive end-organ damage includes kidney disease. Identifiable causes of hypertension include chronic renal disease and a thyroid problem.  Diabetes She presents for her follow-up diabetic visit. She has type 2 diabetes mellitus. Onset time: She was diagnosed at approximate age of 46 years. Her disease course has been fluctuating. Pertinent negatives for hypoglycemia include no confusion, headaches, nervousness/anxiousness, sweats or tremors. Associated symptoms include weight loss. Pertinent negatives for diabetes include no blurred vision, no fatigue, no foot paresthesias, no polydipsia, no polyphagia and no polyuria. There are no hypoglycemic complications. Symptoms are stable. Diabetic complications include nephropathy. Risk factors for coronary artery disease include dyslipidemia, diabetes mellitus, obesity, hypertension, sedentary lifestyle, post-menopausal and tobacco exposure. Current diabetic treatment includes insulin injections and oral agent (monotherapy) (and Ozempic). She is compliant with treatment most of the time. Her weight is fluctuating minimally. She is following a generally unhealthy diet. When asked about meal planning, she reported  none. She has not had a previous visit with a dietitian. She rarely participates in exercise. Her home blood glucose trend is fluctuating dramatically. Her breakfast blood glucose range is generally 140-180 mg/dl. Her dinner blood glucose range is generally >200 mg/dl. (She presents today with her meter and logs showing near target fasting and signifiantly above target postprandial readings.  Her POCT A1c today is 9.9%, increasing from last visit of 8.2%.  She denies any hypoglycemia.  Analysis of her meter shows 7-day average of 212 with 12 readings; 14-day average of 215 with 25 readings, and 30-day average of 204 with 57 readings.  She complains of stomach hurting with Metformin.  She also tried the CGM and did not like it.) An ACE inhibitor/angiotensin II receptor blocker is being taken. She does not see a podiatrist.Eye exam is current.  Thyroid Problem Presents for follow-up (-She is also status post radioactive iodine  treatment for toxic nodule on October 17, 2010.  She has had  history of multinodular goiter status post fine-needle aspiration with benign findings.) visit. Symptoms include weight loss. Patient reports no anxiety, cold intolerance, constipation, depressed mood, diarrhea, fatigue, heat intolerance, leg swelling, palpitations, tremors or weight gain. The symptoms have been stable.     Review of systems  Constitutional: + steadily decreasing body weight,  current Body mass index is 33.05 kg/m. , no fatigue, no subjective hyperthermia, no subjective hypothermia Eyes: no blurry vision, no xerophthalmia ENT: no sore throat, no nodules palpated in throat, no dysphagia/odynophagia, no hoarseness Cardiovascular: no chest pain, no shortness of breath, no palpitations, no leg swelling Respiratory: no cough, no shortness of breath Gastrointestinal: no nausea/vomiting/diarrhea Musculoskeletal: no muscle/joint aches Skin: no rashes, no hyperemia Neurological: no tremors, no numbness, no  tingling, no dizziness Psychiatric: no depression, no anxiety    Objective:    BP 118/74   Pulse 91   Ht '5\' 7"'  (1.702 m)   Wt 211 lb (95.7 kg)   BMI 33.05 kg/m   Wt Readings from Last 3 Encounters:  08/12/21 211 lb (95.7 kg)  06/11/21 212 lb 0.6 oz (96.2 kg)  05/08/21 213 lb (96.6 kg)    BP Readings from Last 3 Encounters:  08/12/21 118/74  06/11/21 121/74  05/08/21 131/84    Physical Exam- Limited  Constitutional:  Body mass index is 33.05 kg/m. , not in acute distress, normal state of mind, ? Memory deficits Eyes:  EOMI, no exophthalmos Neck: Supple Cardiovascular: RRR, no murmurs, rubs, or gallops, no edema Respiratory: Adequate breathing efforts, no crackles, rales, rhonchi, or wheezing Musculoskeletal: no gross deformities, strength intact in all four extremities, no gross restriction of joint movements Skin:  no rashes, no hyperemia Neurological: no tremor with outstretched hands    CMP     Component Value Date/Time   NA 136 08/04/2021 1019   K 4.7 08/04/2021 1019   CL 97 08/04/2021 1019   CO2 19 (L) 08/04/2021 1019   GLUCOSE 211 (H) 08/04/2021 1019   GLUCOSE 395 (H) 12/28/2020 2201   BUN 22 08/04/2021 1019   CREATININE 1.10 (H) 08/04/2021 1019   CREATININE 0.96 (H) 03/12/2016 0925   CALCIUM 9.9 08/04/2021 1019   PROT 8.0 08/04/2021 1019   ALBUMIN 4.6 08/04/2021 1019   AST 19 08/04/2021 1019   ALT 15 08/04/2021 1019   ALKPHOS 99 08/04/2021 1019   BILITOT 0.3 08/04/2021 1019   GFRNONAA 40 (L) 12/28/2020 2201   GFRNONAA 60 03/12/2016 0925   GFRAA 48 (L) 04/11/2020 1007   GFRAA 69 03/12/2016 0925     Diabetic Labs (most recent): Lab Results  Component Value Date   HGBA1C 9.9 08/12/2021   HGBA1C 8.2 (A) 03/24/2021   HGBA1C 9.7 (A) 12/19/2020   MICROALBUR 10 04/19/2020   MICROALBUR 0.4 03/12/2016   MICROALBUR 0.2 02/19/2014     Lipid Panel ( most recent) Lipid Panel     Component Value Date/Time   CHOL 179 08/04/2021 1020   TRIG 93  08/04/2021 1020   HDL 59 08/04/2021 1020   CHOLHDL 3.0 08/04/2021 1020   CHOLHDL 2.2 01/11/2016 0916   VLDL 11 01/11/2016 0916   LDLCALC 103 (H) 08/04/2021 1020    Assessment & Plan:   1) Uncontrolled type 2 diabetes mellitus with stage 3 chronic kidney disease, with long-term current use of insulin (HCC)  She presents today with her meter and logs showing near target fasting and signifiantly above target  postprandial readings.  Her POCT A1c today is 9.9%, increasing from last visit of 8.2%.  She denies any hypoglycemia.  Analysis of her meter shows 7-day average of 212 with 12 readings; 14-day average of 215 with 25 readings, and 30-day average of 204 with 57 readings.  She complains of stomach hurting with Metformin.  She also tried the CGM and did not like it.  - Nutritional counseling repeated at each appointment due to patients tendency to fall back in to old habits.  - The patient admits there is a room for improvement in their diet and drink choices. -  Suggestion is made for the patient to avoid simple carbohydrates from their diet including Cakes, Sweet Desserts / Pastries, Ice Cream, Soda (diet and regular), Sweet Tea, Candies, Chips, Cookies, Sweet Pastries, Store Bought Juices, Alcohol in Excess of 1-2 drinks a day, Artificial Sweeteners, Coffee Creamer, and "Sugar-free" Products. This will help patient to have stable blood glucose profile and potentially avoid unintended weight gain.   - I encouraged the patient to switch to unprocessed or minimally processed complex starch and increased protein intake (animal or plant source), fruits, and vegetables.   - Patient is advised to stick to a routine mealtimes to eat 3 meals a day and avoid unnecessary snacks (to snack only to correct hypoglycemia).  -She is advised to continue her Tresiba 58 units SQ nightly and increase her Ozempic to 0.5 mg SQ weekly (has only been taking the 0.25 mg dose).  She is advised to stop her Metformin for  now to see if it improves her upset stomach.   -She is advised to continue monitoring blood glucose twice daily, before breakfast and before bed, and to call the clinic if she has readings less than 70 or above 200 for 3 tests in a row.  She does not want a CGM, has tried it and does not like it.  2) Hypothyroidism- s/p RAI -Her recent TFTs are consistent with appropriate hormone replacement.   She is advised to continue Levothyroxine 88 mcg po daily before breakfast.    - We discussed about the correct intake of her thyroid hormone, on empty stomach at fasting, with water, separated by at least 30 minutes from breakfast and other medications,  and separated by more than 4 hours from calcium, iron, multivitamins, acid reflux medications (PPIs). -Patient is made aware of the fact that thyroid hormone replacement is needed for life, dose to be adjusted by periodic monitoring of thyroid function tests.  3) Weight management:  Her Body mass index is 33.05 kg/m.-a candidate for modest weight loss.  Patient specific carbs restrictions and exercise regimen discussed with her.  4) Blood pressure/ Hypertension: -Her blood pressure is controlled to target.  She is advised to continue Amlodipine Benazepril 5-40 mg po daily and HCTZ 25 mg po daily.    5) Hyperlipidemia: Her recent lipid panel from 08/04/21 showed uncontrolled LDL of 103.  She is advised to continue Pravastatin 40 mg po daily at bedtime.  Side effects and precautions discussed with her.  - I advised patient to maintain close follow up with Fayrene Helper, MD for diabetes and all other primary care needs.     I spent 35 minutes in the care of the patient today including review of labs from [place], Lipids, Thyroid Function, Hematology (current and previous including abstractions from other facilities); face-to-face time discussing  her blood glucose readings/logs, discussing hypoglycemia and hyperglycemia episodes and symptoms, medications  doses, her options  of short and long term treatment based on the latest standards of care / guidelines;  discussion about incorporating lifestyle medicine;  and documenting the encounter.    Please refer to Patient Instructions for Blood Glucose Monitoring and Insulin/Medications Dosing Guide"  in media tab for additional information. Please  also refer to " Patient Self Inventory" in the Media  tab for reviewed elements of pertinent patient history.  Stacey Hanson participated in the discussions, expressed understanding, and voiced agreement with the above plans.  All questions were answered to her satisfaction. she is encouraged to contact clinic should she have any questions or concerns prior to her return visit.    Follow up plan: Return in about 3 months (around 11/12/2021) for Diabetes F/U with A1c in office, No previsit labs, Bring meter and logs.   Rayetta Pigg, [facility] [address] [place], [place] [address] Phone: [phone] Fax: [phone]  08/12/2021, 11:33 AM

## 2021-08-29 ENCOUNTER — Ambulatory Visit (HOSPITAL_COMMUNITY): Payer: Medicare Other

## 2021-08-30 ENCOUNTER — Other Ambulatory Visit: Payer: Self-pay | Admitting: Family Medicine

## 2021-08-30 DIAGNOSIS — E1165 Type 2 diabetes mellitus with hyperglycemia: Secondary | ICD-10-CM

## 2021-09-05 ENCOUNTER — Other Ambulatory Visit: Payer: Self-pay

## 2021-09-05 MED ORDER — PRAVASTATIN SODIUM 40 MG PO TABS: ORAL_TABLET | ORAL | 1 refills | Status: DC

## 2021-09-12 ENCOUNTER — Ambulatory Visit (HOSPITAL_COMMUNITY)
Admission: RE | Admit: 2021-09-12 | Discharge: 2021-09-12 | Disposition: A | Discharge status: Home / Self Care | Payer: Medicare Other | Source: Ambulatory Visit | Attending: Family Medicine | Admitting: Family Medicine

## 2021-09-12 DIAGNOSIS — Z1231 Encounter for screening mammogram for malignant neoplasm of breast: Secondary | ICD-10-CM | POA: Insufficient documentation

## 2021-09-24 DIAGNOSIS — M79674 Pain in right toe(s): Secondary | ICD-10-CM | POA: Diagnosis not present

## 2021-09-24 DIAGNOSIS — M79675 Pain in left toe(s): Secondary | ICD-10-CM | POA: Diagnosis not present

## 2021-09-24 DIAGNOSIS — E1151 Type 2 diabetes mellitus with diabetic peripheral angiopathy without gangrene: Secondary | ICD-10-CM | POA: Diagnosis not present

## 2021-09-24 DIAGNOSIS — M79671 Pain in right foot: Secondary | ICD-10-CM | POA: Diagnosis not present

## 2021-09-24 DIAGNOSIS — M79672 Pain in left foot: Secondary | ICD-10-CM | POA: Diagnosis not present

## 2021-10-16 ENCOUNTER — Other Ambulatory Visit: Payer: Self-pay | Admitting: *Deleted

## 2021-10-16 DIAGNOSIS — I10 Essential (primary) hypertension: Secondary | ICD-10-CM

## 2021-10-16 NOTE — Addendum Note (Signed)
Addended by: Emelia Loron A on: 10/16/2021 02:29 PM   Modules accepted: Orders

## 2021-10-16 NOTE — Patient Outreach (Addendum)
  Care Coordination   Initial Visit Note   10/16/2021 Name: Stacey Hanson MRN: [id number] DOB: 12-Jun-1945  Stacey Hanson is a 76 y.o. year old female who sees Fayrene Helper, MD for primary care. I spoke with  [name] by phone today  What matters to the patients health and wellness today?  Improving her diabetes, getting transportation to her out of town providers, getting a new tub bench, and better pain control.   Nurse sent a referral to community care guide for transportation assistance.    Goals Addressed               This Visit's Progress     Patient Stated     patient stated (pt-stated)        Patient states she does not feel her diabetes medication is working well for her. She wants to work towards lowering her blood sugar and A1c. Patient states she feels that her insulins are causing her to have joint and muscle pain. Patient states she needs a new tub bench and transportation to her out of town providers is an issue.   Nurse provided patient with [facility] transportation number and will refer to community care guide.         SDOH assessments and interventions completed:  Yes  SDOH Interventions Today    Flowsheet Row Most Recent Value  SDOH Interventions   Transportation Interventions --  [Referral to community care guide]        Care Coordination Interventions Activated:  Yes  Care Coordination Interventions:  Yes, provided   Follow up plan: Follow up call scheduled for 10/21/21 '@1000'$     Encounter Outcome:  Pt. Visit Completed   Emelia Loron RN, BSN [name] [phone] [name]

## 2021-10-17 ENCOUNTER — Telehealth: Payer: Self-pay | Admitting: *Deleted

## 2021-10-17 NOTE — Telephone Encounter (Signed)
   Telephone encounter was:  Successful.  10/17/2021 Name: Stacey Hanson MRN: [id number] DOB: 1945-04-12  Stacey Hanson is a 76 y.o. year old female who is a primary care patient of Moshe Cipro Norwood Levo, MD . The community resource team was consulted for assistance with Transportation Needs   Care guide performed the following interventions: Patient provided with information about care guide support team and interviewed to confirm resource needs. Provided transportation options Follow Up Plan:  No further follow up planned at this time. The patient has been provided with needed resources. [place] [phone] [address] [place] , [place] [id number] Email : [email or web address]

## 2021-10-21 ENCOUNTER — Ambulatory Visit: Payer: Self-pay | Admitting: *Deleted

## 2021-10-21 NOTE — Patient Outreach (Addendum)
  Care Coordination   Initial Visit Note   10/21/2021 Name: Stacey Hanson MRN: [id number] DOB: 1945-08-11  Stacey Hanson is a 76 y.o. year old female who sees Fayrene Helper, MD for primary care. I spoke with  [name] by phone today  What matters to the patients health and wellness today?  Trying to keep blood sugar down, need shower chair  Reports she has completed the shingles vaccines at her local [facility] in 2022 and had all her covid vaccines   Goals Addressed               This Visit's Progress     Patient Stated     COMPLETED: confirmed shingles vaccine completed [facility]) (pt-stated)   On track     Care Coordination Interventions: Evaluation of current treatment plan related to shingles and covid vaccines and patient's adherence to plan as established by provider Advised patient to provide appropriate vaccination information to provider or CM team member at next visit Outreach to [place] and spoke with aleah to confirm patient obtained shingles vaccine dosages on 12/16/20 & 06/16/21 updated EPIC vaccine records  She is pending further covid vaccines with upcoming pcp visit Goal completed      Keep blood sugar down [facility]) (pt-stated)   Not on track     10/21/21  Care Coordination Interventions: Reviewed medications with patient and discussed importance of medication adherence Discussed plans with patient for ongoing care management follow up and provided patient with direct contact information for care management team Reviewed scheduled/upcoming provider appointments including: 10/29/21 pcp visit Review of patient status, including review of consultants reports, relevant laboratory and other test results, and medications completed Assessed social determinant of health barriers Encouraged patient to write down her preferred foods for 1-2 days for her & RN CM to review for better choice discussion for next outreach. Assessed a few of her favorite foods on 10/21/21 with  discussion of better choices to assist with maintaining diabetes cbg values below 200-300s Discussed nutritional consult. Previously seen by a nutritionist. Patient able to identify concerns with snacking and extra carbohydrates in maintaining her diabetes       obtain a new shower chair with back [facility]) (pt-stated)   Not on track     10/21/21 Care Coordination Interventions: Evaluation of current treatment plan related to DME/shower chair with back and patient's adherence to plan as established by provider Collaborated with pcp regarding order for shower chair with back Discussed with patient the process to obtain a new shower chair with back and medicare guidelines Notified Dr Moshe Cipro of need for new shower chair with back order needed via secure chat. To be addressed per pcp during 10/29/21 pcp office visit        SDOH assessments and interventions completed:  Yes  SDOH Interventions Today    Flowsheet Row Most Recent Value  SDOH Interventions   Food Insecurity Interventions Intervention Not Indicated  Stress Interventions Intervention Not Indicated        Care Coordination Interventions Activated:  Yes  Care Coordination Interventions:  Yes, provided   Follow up plan: Follow up call scheduled for 10/30/21 10 am    Encounter Outcome:  Pt. Visit Completed   Dina Mobley L. Lavina Hamman, RN, BSN, [place] Coordinator Office number [phone]

## 2021-10-21 NOTE — Patient Instructions (Addendum)
Visit Information  Thank you for taking time to visit with me today. Please don't hesitate to contact me if I can be of assistance to you.   Following are the goals we discussed today:   Goals Addressed               This Visit's Progress     Patient Stated     COMPLETED: confirmed shingles vaccine completed [facility]) (pt-stated)   On track     Care Coordination Interventions: Evaluation of current treatment plan related to shingles and covid vaccines and patient's adherence to plan as established by provider Advised patient to provide appropriate vaccination information to provider or CM team member at next visit Outreach to [place] and spoke with aleah to confirm patient obtained shingles vaccine dosages on 12/16/20 & 06/16/21 updated EPIC vaccine records  She is pending further covid vaccines with upcoming pcp visit Goal completed      Keep blood sugar down [facility]) (pt-stated)   Not on track     10/21/21  Care Coordination Interventions: Reviewed medications with patient and discussed importance of medication adherence Discussed plans with patient for ongoing care management follow up and provided patient with direct contact information for care management team Reviewed scheduled/upcoming provider appointments including: 10/29/21 pcp visit Review of patient status, including review of consultants reports, relevant laboratory and other test results, and medications completed Assessed social determinant of health barriers Encouraged patient to write down her preferred foods for 1-2 days for her & RN CM to review for better choice discussion for next outreach. Assessed a few of her favorite foods on 10/21/21 with discussion of better choices to assist with maintaining diabetes cbg values below 200-300s Discussed nutritional consult. Previously seen by a nutritionist. Patient able to identify concerns with snacking and extra carbohydrates in maintaining her diabetes       obtain a new shower  chair with back [facility]) (pt-stated)   Not on track     10/21/21 Care Coordination Interventions: Evaluation of current treatment plan related to DME/shower chair with back and patient's adherence to plan as established by provider Collaborated with pcp regarding order for shower chair with back Discussed with patient the process to obtain a new shower chair with back and medicare guidelines Notified Dr Moshe Cipro of need for new shower chair with back order needed via secure chat. To be addressed per pcp during 10/29/21 pcp office visit        Our next appointment is by telephone on 10/30/21 at 10 am  Please call the care guide team at [phone] if you need to cancel or reschedule your appointment.   If you are experiencing a Mental Health or [place] or need someone to talk to, please call the Suicide and Crisis Lifeline: 988 call the [place] National Suicide Prevention Lifeline: 351-506-2240 or TTY: 9522278792 TTY (971)427-8687) to talk to a trained counselor call 1-800-273-TALK (toll free, 24 hour hotline) call the [facility]: [phone] call 911   The patient verbalized understanding of instructions, educational materials, and care plan provided today and DECLINED offer to receive copy of patient instructions, educational materials, and care plan.   The patient has been provided with contact information for the care management team and has been advised to call with any health related questions or concerns.   [place] Lavina Hamman, RN, BSN, [place] Coordinator Office number [phone]

## 2021-10-26 ENCOUNTER — Other Ambulatory Visit: Payer: Self-pay | Admitting: Family Medicine

## 2021-10-29 ENCOUNTER — Encounter: Payer: Self-pay | Admitting: Family Medicine

## 2021-10-29 ENCOUNTER — Ambulatory Visit (INDEPENDENT_AMBULATORY_CARE_PROVIDER_SITE_OTHER): Payer: Medicare Other | Admitting: Family Medicine

## 2021-10-29 VITALS — BP 122/66 | HR 91 | Ht 67.0 in | Wt 209.0 lb

## 2021-10-29 DIAGNOSIS — E559 Vitamin D deficiency, unspecified: Secondary | ICD-10-CM

## 2021-10-29 DIAGNOSIS — E1159 Type 2 diabetes mellitus with other circulatory complications: Secondary | ICD-10-CM | POA: Diagnosis not present

## 2021-10-29 DIAGNOSIS — I10 Essential (primary) hypertension: Secondary | ICD-10-CM | POA: Diagnosis not present

## 2021-10-29 DIAGNOSIS — E890 Postprocedural hypothyroidism: Secondary | ICD-10-CM

## 2021-10-29 DIAGNOSIS — E7800 Pure hypercholesterolemia, unspecified: Secondary | ICD-10-CM

## 2021-10-29 DIAGNOSIS — K219 Gastro-esophageal reflux disease without esophagitis: Secondary | ICD-10-CM | POA: Diagnosis not present

## 2021-10-29 NOTE — Assessment & Plan Note (Signed)
Followed by Endo, stable

## 2021-10-29 NOTE — Progress Notes (Signed)
Stacey Hanson     MRN: [id number]      DOB: 1945/11/01   HPI Stacey Hanson is here for follow up and re-evaluation of chronic medical conditions, medication management and review of any available recent lab and radiology data.  Preventive health is updated, specifically  Cancer screening and Immunization.   Questions or concerns regarding consultations or procedures which the PT has had in the interim are  addressed. The PT denies any adverse reactions to current medications since the last visit.  There are no new concerns.  There are no specific complaints  Denies polyuria, polydipsia, blurred vision , or hypoglycemic episodes. Blood sugar is uncontrolled and has been eating excess sweets , is changoing this  ROS Denies recent fever or chills. Denies sinus pressure, nasal congestion, ear pain or sore throat. Denies chest congestion, productive cough or wheezing. Denies chest pains, palpitations and leg swelling Denies abdominal pain, nausea, vomiting,diarrhea or constipation.   Denies dysuria, frequency, hesitancy or incontinence. Denies  uncontrolled joint pain, swelling and limitation in mobility. Denies headaches, seizures, numbness, or tingling. Denies depression, anxiety or insomnia. Denies skin break down or rash.   PE  BP 122/66 (BP Location: Right Arm, Cuff Size: Large)   Pulse 91   Ht '5\' 7"'$  (1.702 m)   Wt 209 lb (94.8 kg)   SpO2 97%   BMI 32.73 kg/m   Patient alert and oriented and in no cardiopulmonary distress.  HEENT: No facial asymmetry, EOMI,     Neck supple .  Chest: Clear to auscultation bilaterally.  CVS: S1, S2 no murmurs, no S3.Regular rate.  ABD: Soft non tender.   Ext: No edema  MS: Adequate  though reduced ROM spine, shoulders, hips and knees.  Skin: Intact, no ulcerations or rash noted.  Psych: Good eye contact, normal affect. Memory intact not anxious or depressed appearing.  CNS: CN 2-12 intact, power,  normal throughout.no focal  deficits noted.   Assessment & Plan  Essential (primary) hypertension Controlled, no change in medication DASH diet and commitment to daily physical activity for a minimum of 30 minutes discussed and encouraged, as a part of hypertension management. The importance of attaining a healthy weight is also discussed.     10/29/2021   10:37 AM 10/29/2021   10:30 AM 08/12/2021   10:43 AM 06/11/2021   11:14 AM 05/08/2021    1:34 PM 03/24/2021    1:20 PM 03/12/2021   10:44 AM  BP/Weight  Systolic BP 607 371 062 694 854 627 035  Diastolic BP 66 81 74 74 84 75 70  Wt. (Lbs)  209 211 212.04 213 217.2   BMI  32.73 kg/m2 33.05 kg/m2 33.21 kg/m2 33.36 kg/m2 34.02 kg/m2        Type 2 diabetes mellitus with vascular disease (HCC) Uncontrolled, managed byEndo. Refuses diabetic ed DASH diet and commitment to daily physical activity for a minimum of 30 minutes discussed and encouraged, as a part of hypertension management. The importance of attaining a healthy weight is also discussed.     10/29/2021   10:37 AM 10/29/2021   10:30 AM 08/12/2021   10:43 AM 06/11/2021   11:14 AM 05/08/2021    1:34 PM 03/24/2021    1:20 PM 03/12/2021   10:44 AM  BP/Weight  Systolic BP 009 381 829 937 169 678 938  Diastolic BP 66 81 74 74 84 75 70  Wt. (Lbs)  209 211 212.04 213 217.2   BMI  32.73 kg/m2 33.05 kg/m2  33.21 kg/m2 33.36 kg/m2 34.02 kg/m2        Hypothyroidism following radioiodine therapy Followed by Endo, stable  Pure hypercholesterolemia Hyperlipidemia:Low fat diet discussed and encouraged.   Lipid Panel  Lab Results  Component Value Date   CHOL 179 08/04/2021   HDL 59 08/04/2021   LDLCALC 103 (H) 08/04/2021   TRIG 93 08/04/2021   CHOLHDL 3.0 08/04/2021     Needs to reduce fat in diet, statin not contributing to muscle pain, feels pain started with her insulin no change desired at this time  Gastroesophageal reflux disease Controlled, no change in medication

## 2021-10-29 NOTE — Patient Instructions (Signed)
Annual exam in January, call if you need me sooner  Fasting CBC, lipid, cmp and eGFr, and vit d 3 to 5 days before appt  Work on blood sugar, cut out white foods and sugary foods  Please arrange flu vaccine with nurse at checkout  Please get covid booster  when available at your pharmacy  Thanks for choosing [facility], we consider it a privelige to serve you.

## 2021-10-29 NOTE — Assessment & Plan Note (Signed)
Uncontrolled, managed byEndo. Refuses diabetic ed DASH diet and commitment to daily physical activity for a minimum of 30 minutes discussed and encouraged, as a part of hypertension management. The importance of attaining a healthy weight is also discussed.     10/29/2021   10:37 AM 10/29/2021   10:30 AM 08/12/2021   10:43 AM 06/11/2021   11:14 AM 05/08/2021    1:34 PM 03/24/2021    1:20 PM 03/12/2021   10:44 AM  BP/Weight  Systolic BP 719 941 290 475 339 179 217  Diastolic BP 66 81 74 74 84 75 70  Wt. (Lbs)  209 211 212.04 213 217.2   BMI  32.73 kg/m2 33.05 kg/m2 33.21 kg/m2 33.36 kg/m2 34.02 kg/m2

## 2021-10-29 NOTE — Assessment & Plan Note (Signed)
Hyperlipidemia:Low fat diet discussed and encouraged.   Lipid Panel  Lab Results  Component Value Date   CHOL 179 08/04/2021   HDL 59 08/04/2021   LDLCALC 103 (H) 08/04/2021   TRIG 93 08/04/2021   CHOLHDL 3.0 08/04/2021     Needs to reduce fat in diet, statin not contributing to muscle pain, feels pain started with her insulin no change desired at this time

## 2021-10-29 NOTE — Assessment & Plan Note (Signed)
Controlled, no change in medication DASH diet and commitment to daily physical activity for a minimum of 30 minutes discussed and encouraged, as a part of hypertension management. The importance of attaining a healthy weight is also discussed.     10/29/2021   10:37 AM 10/29/2021   10:30 AM 08/12/2021   10:43 AM 06/11/2021   11:14 AM 05/08/2021    1:34 PM 03/24/2021    1:20 PM 03/12/2021   10:44 AM  BP/Weight  Systolic BP 136 859 923 414 436 016 580  Diastolic BP 66 81 74 74 84 75 70  Wt. (Lbs)  209 211 212.04 213 217.2   BMI  32.73 kg/m2 33.05 kg/m2 33.21 kg/m2 33.36 kg/m2 34.02 kg/m2

## 2021-10-29 NOTE — Assessment & Plan Note (Signed)
Controlled, no change in medication  

## 2021-10-30 ENCOUNTER — Ambulatory Visit: Payer: Self-pay | Admitting: *Deleted

## 2021-10-30 NOTE — Patient Instructions (Addendum)
Visit Information  Thank you for taking time to visit with me today. Please don't hesitate to contact me if I can be of assistance to you.   Following are the goals we discussed today:   Goals Addressed               This Visit's Progress     Patient Stated     Keep blood sugar down [facility]) (pt-stated)   On track     10/21/21  Care Coordination Interventions: Provided education to patient about basic DM disease process Assessed social determinant of health barriers Reviewed the concerns voiced in her recent pcp visit need to decrease HgA1c Reviewed her meals on 10/29/21 and 10/30/21 (green beans, potato, corn flakes, banana, green tea, milk boiled eggs, nature own bread 2 slices with sausage) better choices for potatoes, noodles reviewed Reviewed food labels for her nature's own wheat bread and ritz crackers Discussed carbohydrate daily intake. Had her to read through the food label and discussed the carbohydrate, sugar, calories, sodium, fat contents Discussed diabetics should have 130 grams of carbs per day if you typically eat 2,000 calories.  Recommended intake of sodium to 1,500 mg/day for diabetics/ Discussed 45-60 grams of carbs per meal and 15-20 grams per snack. Answered various questions   Encourage not to skip meals Commended her for her recent weight loss and active interventions to improve her diet Will mail patient recommended diabetic nutritional intake information to assist her to make better choices and reading food labels - diabetic meal planning, carbohydrate counting diet      obtain a new shower chair with back [facility]) (pt-stated)   Not on track     10/21/21 Care Coordination Interventions: Evaluation of current treatment plan related to DME/shower chair with back and patient's adherence to plan as established by provider Collaborated with pcp regarding order for shower chair with back Notified Dr Moshe Cipro of need for new shower chair with back order needed via secure chat  and in basket message. Pt forgot during 10/29/21 pcp office visit. PCP to assist per response        Our next appointment is by telephone on 11/13/21 at 1000  Please call the care guide team at [phone] if you need to cancel or reschedule your appointment.   If you are experiencing a Mental Health or [place] or need someone to talk to, please call the Suicide and Crisis Lifeline: 988 call the [place] National Suicide Prevention Lifeline: 228-855-1357 or TTY: 612-301-9609 TTY 980 450 8225) to talk to a trained counselor call 1-800-273-TALK (toll free, 24 hour hotline) call the [facility]: [phone] call 911   The patient verbalized understanding of instructions, educational materials, and care plan provided today and agreed to receive a mailed copy of patient instructions, educational materials, and care plan.   The patient has been provided with contact information for the care management team and has been advised to call with any health related questions or concerns.   [place] Lavina Hamman, RN, BSN, [place] Coordinator Office number [phone]

## 2021-10-30 NOTE — Patient Outreach (Signed)
  Care Coordination   Follow Up Visit Note   10/30/2021 Name: Stacey Hanson MRN: [id number] DOB: Mar 23, 1945  Stacey Hanson is a 76 y.o. year old female who sees Fayrene Helper, MD for primary care. I spoke with  Stacey Hanson by phone today.  What matters to the patients health and wellness today?  Getting Hg    Goals Addressed               This Visit's Progress     Patient Stated     Keep blood sugar down [facility]) (pt-stated)   On track     10/21/21  Care Coordination Interventions: Provided education to patient about basic DM disease process Assessed social determinant of health barriers Reviewed the concerns voiced in her recent pcp visit need to decrease HgA1c Reviewed her meals on 10/29/21 and 10/30/21 (green beans, potato, corn flakes, banana, green tea, milk boiled eggs, nature own bread 2 slices with sausage) better choices for potatoes, noodles reviewed Reviewed food labels for her nature's own wheat bread and ritz crackers Discussed carbohydrate daily intake. Diabetics should have 130 grams of carbs per day if you typically eat 2,000 calories.  Recommended intake of sodium to 1,500 mg/day for diabetics/ Discussed 45-60 grams of carbs per meal and 15-20 grams per snack.  Encourage not to skip meals Commended her for her recent weight loss and active interventions to improve her diet Will mail patient recommended diabetic nutritional intake information to assist her to make better choices and reading food labels       obtain a new shower chair with back [facility]) (pt-stated)   Not on track     10/21/21 Care Coordination Interventions: Evaluation of current treatment plan related to DME/shower chair with back and patient's adherence to plan as established by provider Collaborated with pcp regarding order for shower chair with back Notified Dr Moshe Cipro of need for new shower chair with back order needed via secure chat and in basket message. Pt forgot during 10/29/21 pcp office  visit. PCP to assist per response        SDOH assessments and interventions completed:  Yes  SDOH Interventions Today    Flowsheet Row Most Recent Value  SDOH Interventions   Food Insecurity Interventions Intervention Not Indicated  Financial Strain Interventions Intervention Not Indicated  Stress Interventions Intervention Not Indicated        Care Coordination Interventions Activated:  Yes  Care Coordination Interventions:  Yes, provided    Follow up plan: Follow up call scheduled for 11/13/21 1000    Encounter Outcome:  Pt. Visit Completed   Stacey Hanson L. Lavina Hamman, RN, BSN, [place] Coordinator Office number [phone]

## 2021-11-02 ENCOUNTER — Telehealth: Payer: Self-pay | Admitting: Family Medicine

## 2021-11-02 NOTE — Telephone Encounter (Signed)
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

## 2021-11-04 NOTE — Telephone Encounter (Signed)
Shower chair with back ordered from [employer]

## 2021-11-12 ENCOUNTER — Ambulatory Visit (INDEPENDENT_AMBULATORY_CARE_PROVIDER_SITE_OTHER): Payer: Medicare Other

## 2021-11-12 ENCOUNTER — Encounter: Payer: Self-pay | Admitting: Nurse Practitioner

## 2021-11-12 ENCOUNTER — Ambulatory Visit: Payer: Medicare Other | Admitting: Nurse Practitioner

## 2021-11-12 VITALS — BP 108/73 | HR 93 | Ht 67.0 in | Wt 206.8 lb

## 2021-11-12 DIAGNOSIS — E782 Mixed hyperlipidemia: Secondary | ICD-10-CM | POA: Diagnosis not present

## 2021-11-12 DIAGNOSIS — I10 Essential (primary) hypertension: Secondary | ICD-10-CM | POA: Diagnosis not present

## 2021-11-12 DIAGNOSIS — E890 Postprocedural hypothyroidism: Secondary | ICD-10-CM

## 2021-11-12 DIAGNOSIS — Z23 Encounter for immunization: Secondary | ICD-10-CM

## 2021-11-12 DIAGNOSIS — E1165 Type 2 diabetes mellitus with hyperglycemia: Secondary | ICD-10-CM | POA: Diagnosis not present

## 2021-11-12 LAB — POCT GLYCOSYLATED HEMOGLOBIN (HGB A1C): Hemoglobin A1C: 9.8 % — AB (ref 4.0–5.6)

## 2021-11-12 MED ORDER — TRESIBA FLEXTOUCH 100 UNIT/ML ~~LOC~~ SOPN
60.0000 [IU] | PEN_INJECTOR | Freq: Every day | SUBCUTANEOUS | 3 refills | Status: DC
Start: 2021-11-12 — End: 2022-09-28

## 2021-11-12 NOTE — Progress Notes (Signed)
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    Knee pain    Obesity    Past Surgical History:  Procedure Laterality Date   ABDOMINAL HYSTERECTOMY  03/02/1968   CATARACT EXTRACTION W/PHACO Left 03/09/2019   Procedure: CATARACT EXTRACTION PHACO AND INTRAOCULAR LENS PLACEMENT ([place]);  Surgeon: Baruch Goldmann, MD;  Location: [facility];  Service: Ophthalmology;  Laterality: Left;  CDE: 9.08   CATARACT EXTRACTION W/PHACO Right 03/27/2019   Procedure: CATARACT EXTRACTION PHACO AND INTRAOCULAR LENS PLACEMENT RIGHT EYE;  Surgeon: Baruch Goldmann, MD;  Location: [facility];  Service: Ophthalmology;  Laterality: Right;  CDE: 7.47   CHOLECYSTECTOMY  OCT 2010 BZ BILIARY DYSKINESIA   CHRONIC CHOLECYSTITIS   COLONOSCOPY  MAY 2009 SCREENING   pTC TICS, [facility]   COLONOSCOPY N/A 08/11/2017   Procedure: COLONOSCOPY;  Surgeon: Danie Binder, MD;  Location: [facility];  Service: Endoscopy;  Laterality: N/A;  9:30am   ESOPHAGOGASTRODUODENOSCOPY  10/03/2010   Procedure: ESOPHAGOGASTRODUODENOSCOPY (EGD);  Surgeon: Dorothyann Peng, MD;  Location: [facility];  Service: Endoscopy;  Laterality: N/A;   ESOPHAGOGASTRODUODENOSCOPY  12/19/2002   RMR: Normal esophagus/Normal stomach/ Duodenum large bulbar diverticulum, 1 cm bulbar ulcer with surrounding/ inflammation as described above. Normal D2   FOOT SURGERY Left 01/29/2021   POLYPECTOMY  08/11/2017   Procedure: POLYPECTOMY;  Surgeon: Danie Binder, MD;  Location: [facility];  Service: Endoscopy;;  cecal     SAVORY DILATION  10/03/2010   Procedure: SAVORY DILATION;  Surgeon: Dorothyann Peng, MD;  Location: [facility];  Service: Endoscopy;;   TOTAL ABDOMINAL HYSTERECTOMY  03/02/1968   TOTAL KNEE ARTHROPLASTY Left 12/21/2017   Procedure: LEFT TOTAL KNEE ARTHROPLASTY;  Surgeon: Paralee Cancel, MD;  Location: [facility];  Service: Orthopedics;  Laterality: Left;  70 mins block   UPPER GASTROINTESTINAL ENDOSCOPY  OCT 2004 RMR   DUO TIC, & ULCER   Social History   Socioeconomic History   Marital status: Single    Spouse name: Not on file   Number of children: 3   Years of education: 10   Highest education level: 10th grade  Occupational History   Occupation: [employer]-OR   Occupation: Retired   Tobacco Use   Smoking status: Former    Packs/day: 0.25    Years: 10.00    Total pack years: 2.50    Types: Cigarettes    Quit date: 02/20/1985    Years since quitting: 36.7   Smokeless tobacco: Never  Vaping Use   Vaping Use: Never used  Substance and Sexual Activity   Alcohol use: No    Comment: stopped drinking 1998   Drug use: No   Sexual activity: Not Currently  Other Topics Concern   Not on file  Social History Narrative   Lives alone and she retired from [employer] Strain: [place]  (10/30/2021)   Overall  Financial Resource Strain (CARDIA)    Difficulty of Paying Living Expenses: Not very hard  Food Insecurity: No Food Insecurity (10/30/2021)   Hunger Vital Sign    Worried About Running Out of Food in the Last Year: Never true    Ran Out of Food in the Last Year: Never true  Transportation Needs: Unmet Transportation Needs (10/16/2021)   PRAPARE - Hydrologist (Medical): Yes    Lack of Transportation (Non-Medical): No  Physical Activity: Inactive (02/25/2021)   Exercise Vital Sign    Days of Exercise per Week: 0 days    Minutes of Exercise per Session: 0 min  Stress: No Stress Concern  Present (10/30/2021)   [place]    Feeling of Stress : Only a little  Social Connections: Moderately Isolated (02/25/2021)   Social Connection and Isolation Panel [NHANES]    Frequency of Communication with Friends and Family: More than three times a week    Frequency of Social Gatherings with Friends and Family: More than three times a week    Attends Religious Services: Never    Marine scientist or Organizations: Yes    Attends Archivist Meetings: 1 to 4 times per year    Marital Status: Never married   Outpatient Encounter Medications as of 11/12/2021  Medication Sig   alendronate (FOSAMAX) 70 MG tablet Take 1 tablet (70 mg total) by mouth every 7 (seven) days. Take with a full glass of water on an empty stomach.   amLODipine-benazepril (LOTREL) 5-40 MG capsule Take 1 capsule by mouth daily.   aspirin EC 81 MG tablet Take 81 mg by mouth daily.   benazepril (LOTENSIN) 40 MG tablet    Blood Glucose Monitoring Suppl (ONE TOUCH ULTRA 2) w/Device KIT Twice daily testing dx e11.65   Calcium Carb-Cholecalciferol (CALCIUM 600/VITAMIN D3 PO) Take 2 tablets by mouth daily.   docusate sodium (COLACE) 100 MG capsule 100 mg by oral route.   hydrochlorothiazide (HYDRODIURIL) 25 MG tablet TAKE 1 TABLET(25 MG) BY MOUTH DAILY   Insulin Pen Needle (B-D UF III MINI PEN NEEDLES) 31G X 5 MM MISC USE FOR TWICE DAILY INJECTIONS OF HUMALOG   Lancets (ONETOUCH DELICA PLUS [id number]) MISC USE TWICE DAILY TO CHECK BLOOD SUGAR.   levothyroxine (SYNTHROID) 88 MCG tablet TAKE 1 TABLET BY MOUTH DAILY  BEFORE BREAKFAST   MODERNA COVID-19 BIVALENT 50 MCG/0.5ML injection    Multiple Vitamin (MULTIVITAMIN) capsule Take 1 capsule by mouth daily.   omeprazole (PRILOSEC) 20 MG capsule Take 1 capsule (20 mg total) by mouth every morning.   ONETOUCH ULTRA test strip USE TO CHECK BLOOD SUGAR TWICE  DAILY   polyethylene glycol (MIRALAX /  GLYCOLAX) 17 g packet Take 17 g by mouth as needed.   pravastatin (PRAVACHOL) 40 MG tablet TAKE 1 TABLET(40 MG) BY MOUTH DAILY   PREVNAR 20 0.5 ML injection    psyllium (METAMUCIL) 58.6 % packet Take 1 packet by mouth as needed.   Semaglutide,0.25 or 0.5MG/DOS, (OZEMPIC, 0.25 OR 0.5 MG/DOSE,) 2 MG/1.5ML SOPN Inject 0.5 mg into the skin once a week. Inject 0.25 mg SQ weekly x 2 weeks, then advance to 0.5 mg SQ weekly thereafter if tolerated well   UNABLE TO FIND Diabetic shoes x 1 pair, Inserts x 3 pair DX E11.9   [DISCONTINUED] TRESIBA FLEXTOUCH 100 UNIT/ML FlexTouch Pen INJECT SUBCUTANEOUSLY 50 UNITS  AT BEDTIME (Patient taking differently: Patient reports that she  is injecting 58 units at bedtime.)   insulin degludec (TRESIBA FLEXTOUCH) 100 UNIT/ML FlexTouch Pen Inject 60 Units into the skin at bedtime.   [DISCONTINUED] Insulin Lispro Prot & Lispro (HUMALOG MIX 75/25 KWIKPEN) (75-25) 100 UNIT/ML Kwikpen INJECT 40 UNITS UNDER THE SKIN BID (Patient not taking: Reported on 11/12/2021)   No facility-administered encounter medications on file as of 11/12/2021.   ALLERGIES: Allergies  Allergen Reactions   Simvastatin Other (See Comments)    Muscle cramps Other reaction(s): Not available   VACCINATION STATUS: Immunization History  Administered Date(s) Administered   Fluad Quad(high Dose 65+) 11/23/2018, 12/21/2019, 12/10/2020, 11/12/2021   Influenza,inj,Quad PF,6+ Mos 11/16/2012, 11/15/2013, 11/14/2014, 11/14/2015, 11/03/2016, 11/09/2017   Influenza,inj,quad, With Preservative 11/02/2016   Moderna SARS-COV2 Booster Vaccination 07/24/2020, 05/26/2021   Moderna Sars-Covid-2 Vaccination 04/27/2019, 05/26/2019, 01/08/2020   Pneumococcal Conjugate-13 10/11/2013   Pneumococcal Polysaccharide-23 10/07/2011   Tdap 12/16/2020   Zoster Recombinat (Shingrix) 12/16/2020   Zoster, Live 04/05/2006   Zoster, Unspecified 06/16/2021    Hypertension This is a chronic problem. The current episode started  more than 1 year ago. The problem has been resolved since onset. The problem is controlled. Pertinent negatives include no blurred vision, headaches, palpitations, shortness of breath or sweats. Agents associated with hypertension include thyroid hormones. Risk factors for coronary artery disease include diabetes mellitus, dyslipidemia, obesity, sedentary lifestyle and smoking/tobacco exposure. Past treatments include ACE inhibitors, diuretics and calcium channel blockers. The current treatment provides mild improvement. There are no compliance problems.  Hypertensive end-organ damage includes kidney disease. Identifiable causes of hypertension include chronic renal disease and a thyroid problem.  Diabetes She presents for her follow-up diabetic visit. She has type 2 diabetes mellitus. Onset time: She was diagnosed at approximate age of 45 years. Her disease course has been stable. Pertinent negatives for hypoglycemia include no confusion, headaches, nervousness/anxiousness, sweats or tremors. Associated symptoms include weight loss. Pertinent negatives for diabetes include no blurred vision, no fatigue, no foot paresthesias, no polydipsia, no polyphagia and no polyuria. There are no hypoglycemic complications. Symptoms are stable. Diabetic complications include nephropathy. Risk factors for coronary artery disease include dyslipidemia, diabetes mellitus, obesity, hypertension, sedentary lifestyle, post-menopausal and tobacco exposure. Current diabetic treatment includes insulin injections and oral agent (monotherapy) (and Ozempic). She is compliant with treatment most of the time. Her weight is fluctuating minimally. She is following a generally unhealthy diet. When asked about meal planning, she reported none. She has not had a previous visit with a dietitian. She rarely participates in exercise. Her home blood glucose trend is fluctuating dramatically. Her breakfast blood glucose range is generally 140-180  mg/dl. Her dinner blood glucose range is generally >200 mg/dl. (She presents today with her meter and logs showing above target glycemic profile overall.  Her POCT A1c today is 9.8%, stable from last visit of 9.9%.  She denies any hypoglycemia.  Analysis of her meter shows 7-day average of 208, 14-day average of 206, 30-day average of 219.  She does note she is working hard to limit her carb consumption.) An ACE inhibitor/angiotensin II receptor blocker is being taken. She does not see a podiatrist.Eye exam is current.  Thyroid Problem Presents for follow-up (-She is also status post radioactive iodine treatment for toxic nodule on October 17, 2010.  She has had  history of multinodular goiter status post fine-needle aspiration with benign findings.) visit. Symptoms include weight loss. Patient reports no anxiety, cold intolerance, constipation, depressed mood, diarrhea, fatigue, heat intolerance, leg swelling, palpitations, tremors or weight gain.  The symptoms have been stable.     Review of systems  Constitutional: + steadily decreasing body weight,  current Body mass index is 32.39 kg/m. , no fatigue, no subjective hyperthermia, no subjective hypothermia Eyes: no blurry vision, no xerophthalmia ENT: no sore throat, no nodules palpated in throat, no dysphagia/odynophagia, no hoarseness Cardiovascular: no chest pain, no shortness of breath, no palpitations, no leg swelling Respiratory: no cough, no shortness of breath Gastrointestinal: no nausea/vomiting/diarrhea Musculoskeletal: bilateral knee pain and leg aches (has talked with PCP about this) Skin: no rashes, no hyperemia Neurological: no tremors, no numbness, no tingling, no dizziness Psychiatric: no depression, no anxiety    Objective:    BP 108/73 (BP Location: Left Arm, Patient Position: Sitting, Cuff Size: Large)   Pulse 93   Ht '5\' 7"'  (1.702 m)   Wt 206 lb 12.8 oz (93.8 kg)   BMI 32.39 kg/m   Wt Readings from Last 3 Encounters:   11/12/21 206 lb 12.8 oz (93.8 kg)  10/29/21 209 lb (94.8 kg)  08/12/21 211 lb (95.7 kg)    BP Readings from Last 3 Encounters:  11/12/21 108/73  10/29/21 122/66  08/12/21 118/74    Physical Exam- Limited  Constitutional:  Body mass index is 32.39 kg/m. , not in acute distress, normal state of mind, ? Memory deficits Eyes:  EOMI, no exophthalmos Neck: Supple Cardiovascular: RRR, no murmurs, rubs, or gallops, no edema Respiratory: Adequate breathing efforts, no crackles, rales, rhonchi, or wheezing Musculoskeletal: no gross deformities, strength intact in all four extremities, no gross restriction of joint movements Skin:  no rashes, no hyperemia Neurological: no tremor with outstretched hands    CMP     Component Value Date/Time   NA 136 08/04/2021 1019   K 4.7 08/04/2021 1019   CL 97 08/04/2021 1019   CO2 19 (L) 08/04/2021 1019   GLUCOSE 211 (H) 08/04/2021 1019   GLUCOSE 395 (H) 12/28/2020 2201   BUN 22 08/04/2021 1019   CREATININE 1.10 (H) 08/04/2021 1019   CREATININE 0.96 (H) 03/12/2016 0925   CALCIUM 9.9 08/04/2021 1019   PROT 8.0 08/04/2021 1019   ALBUMIN 4.6 08/04/2021 1019   AST 19 08/04/2021 1019   ALT 15 08/04/2021 1019   ALKPHOS 99 08/04/2021 1019   BILITOT 0.3 08/04/2021 1019   GFRNONAA 40 (L) 12/28/2020 2201   GFRNONAA 60 03/12/2016 0925   GFRAA 48 (L) 04/11/2020 1007   GFRAA 69 03/12/2016 0925     Diabetic Labs (most recent): Lab Results  Component Value Date   HGBA1C 9.8 (A) 11/12/2021   HGBA1C 9.9 08/12/2021   HGBA1C 8.2 (A) 03/24/2021   MICROALBUR 10 04/19/2020   MICROALBUR 0.4 03/12/2016   MICROALBUR 0.2 02/19/2014     Lipid Panel ( most recent) Lipid Panel     Component Value Date/Time   CHOL 179 08/04/2021 1020   TRIG 93 08/04/2021 1020   HDL 59 08/04/2021 1020   CHOLHDL 3.0 08/04/2021 1020   CHOLHDL 2.2 01/11/2016 0916   VLDL 11 01/11/2016 0916   LDLCALC 103 (H) 08/04/2021 1020    Assessment & Plan:   1) Uncontrolled  type 2 diabetes mellitus with stage 3 chronic kidney disease, with long-term current use of insulin (HCC)  She presents today with her meter and logs showing above target glycemic profile overall.  Her POCT A1c today is 9.8%, stable from last visit of 9.9%.  She denies any hypoglycemia.  Analysis of her meter shows 7-day average of 208, 14-day average  of 206, 30-day average of 219.  She does note she is working hard to limit her carb consumption.  - Nutritional counseling repeated at each appointment due to patients tendency to fall back in to old habits.  - The patient admits there is a room for improvement in their diet and drink choices. -  Suggestion is made for the patient to avoid simple carbohydrates from their diet including Cakes, Sweet Desserts / Pastries, Ice Cream, Soda (diet and regular), Sweet Tea, Candies, Chips, Cookies, Sweet Pastries, Store Bought Juices, Alcohol in Excess of 1-2 drinks a day, Artificial Sweeteners, Coffee Creamer, and "Sugar-free" Products. This will help patient to have stable blood glucose profile and potentially avoid unintended weight gain.   - I encouraged the patient to switch to unprocessed or minimally processed complex starch and increased protein intake (animal or plant source), fruits, and vegetables.   - Patient is advised to stick to a routine mealtimes to eat 3 meals a day and avoid unnecessary snacks (to snack only to correct hypoglycemia).  -She is advised to increase her Tresiba to 60 units SQ nightly and continue her Ozempic to 0.5 mg SQ weekly (she is hesitant to increase this dose).  She notes her stomach has felt much better since the discontinuation of Metformin and wishes to stay off of it.   -She is advised to continue monitoring blood glucose twice daily, before breakfast and before bed, and to call the clinic if she has readings less than 70 or above 200 for 3 tests in a row.  She does not want a CGM, has tried it and does not like  it.  2) Hypothyroidism- s/p RAI -There are no recent TFTs to review.  She is advised to continue Levothyroxine 88 mcg po daily before breakfast.    - We discussed about the correct intake of her thyroid hormone, on empty stomach at fasting, with water, separated by at least 30 minutes from breakfast and other medications,  and separated by more than 4 hours from calcium, iron, multivitamins, acid reflux medications (PPIs). -Patient is made aware of the fact that thyroid hormone replacement is needed for life, dose to be adjusted by periodic monitoring of thyroid function tests.  3) Weight management:  Her Body mass index is 32.39 kg/m.-a candidate for modest weight loss.  Patient specific carbs restrictions and exercise regimen discussed with her.  4) Blood pressure/ Hypertension: -Her blood pressure is controlled to target.  She is advised to continue Amlodipine Benazepril 5-40 mg po daily and HCTZ 25 mg po daily.    5) Hyperlipidemia: Her recent lipid panel from 08/04/21 showed uncontrolled LDL of 103.  She is advised to continue Pravastatin 40 mg po daily at bedtime.  Side effects and precautions discussed with her.  - I advised patient to maintain close follow up with Fayrene Helper, MD for diabetes and all other primary care needs.     I spent 30 minutes in the care of the patient today including review of labs from [place], Lipids, Thyroid Function, Hematology (current and previous including abstractions from other facilities); face-to-face time discussing  her blood glucose readings/logs, discussing hypoglycemia and hyperglycemia episodes and symptoms, medications doses, her options of short and long term treatment based on the latest standards of care / guidelines;  discussion about incorporating lifestyle medicine;  and documenting the encounter. Risk reduction counseling performed per USPSTF guidelines to reduce obesity and cardiovascular risk factors.     Please refer to Patient  Instructions for Blood Glucose Monitoring and Insulin/Medications Dosing Guide"  in media tab for additional information. Please  also refer to " Patient Self Inventory" in the Media  tab for reviewed elements of pertinent patient history.  Stacey Hanson participated in the discussions, expressed understanding, and voiced agreement with the above plans.  All questions were answered to her satisfaction. she is encouraged to contact clinic should she have any questions or concerns prior to her return visit.    Follow up plan: Return in about 4 months (around 03/14/2022) for Diabetes F/U with A1c in office, No previsit labs, Bring meter and logs.   Rayetta Pigg, [facility] [address] [place], [place] [address] Phone: [phone] Fax: [phone]  11/12/2021, 1:32 PM

## 2021-11-13 ENCOUNTER — Ambulatory Visit: Payer: Self-pay | Admitting: *Deleted

## 2021-11-13 ENCOUNTER — Other Ambulatory Visit: Payer: Self-pay | Admitting: Family Medicine

## 2021-11-13 NOTE — Patient Outreach (Signed)
  Care Coordination   Follow Up Visit Note   11/14/2021 Name: Stacey Hanson MRN: [id number] DOB: 02-21-1946  Stacey Hanson is a 75 y.o. year old female who sees Fayrene Helper, MD for primary care. I spoke with  [name] by phone today.  What matters to the patients health and wellness today?  Still aching, cramping in joints believe related to her statin Pravastin, Ozempic  Receives all medications via [facility] delivery services  Diabetes Recent cbg 156 this morning    Goals Addressed               This Visit's Progress     Patient Stated     Decrease side effects related possibly from medicine causing myalgia [facility]) (pt-stated)   Not on track     Care Coordination Interventions: Re activated Goal 11/13/21. Modified to address Myalgias possibly related to medication side effects Discussed strategies to manage statin-induced myalgias Discussed the medications with known side effects of myalgias Will collaborated with pcp and/or pharmacy to see if patient is able to have medications adjusted or changed to medicine with less side effects      Keep blood sugar down ([facility]) (pt-stated)   On track      Care Coordination Interventions: Discussed plans with patient for ongoing care management follow up and provided patient with direct contact information for care management team Discussed weight loss as beneficial for diabetes management.  Confirm she is improving her diet She confirms a weight loss of 3+ lbs Discussed BMI and reviewed goal of 175 lbs Presently at 206 lbs on 11/12/21 Reports a loss of 3 lbs in 2-3 weeks Assessed blood sugar values. Reports this morning CM of 156       obtain a new shower chair with back [facility]) (pt-stated)   On track     10/21/21 Care Coordination Interventions: Assessed for delivery of durable medical equipment (DME) Updated patient on the care coordination of notifying Dr Moshe Cipro of her need for new shower chair with back order needed via  secure chat and in basket message.  Confirmed patient received a call for the a new shower chair with back from a DME agency. She was informed there would be a charge. She reports declining the offer to get the equipment from the DME agency She prefers to use her Over the counter (OTC) benefit She reports she will use this benefit with her $65 credit to purchase a shower chair        SDOH assessments and interventions completed:  No     Care Coordination Interventions Activated:  Yes  Care Coordination Interventions:  Yes, provided   Follow up plan: Follow up call scheduled for 11/27/21 10 am    Encounter Outcome:  Pt. Visit Completed   [place] Berisha L. Lavina Hamman, RN, BSN, [place] Coordinator Office number [phone]

## 2021-11-14 NOTE — Patient Instructions (Signed)
Visit Information  Thank you for taking time to visit with me today. Please don't hesitate to contact me if I can be of assistance to you.   Following are the goals we discussed today:   Goals Addressed               This Visit's Progress     Patient Stated     Decrease side effects related possibly from medicine causing myalgia [facility]) (pt-stated)   Not on track     Care Coordination Interventions: Re activated Goal 11/13/21. Modified to address Myalgias possibly related to medication side effects Discussed strategies to manage statin-induced myalgias Discussed the medications with known side effects of myalgias Will collaborated with pcp and/or pharmacy to see if patient is able to have medications adjusted or changed to medicine with less side effects      Keep blood sugar down ([facility]) (pt-stated)   On track      Care Coordination Interventions: Discussed plans with patient for ongoing care management follow up and provided patient with direct contact information for care management team Discussed weight loss as beneficial for diabetes management.  Confirm she is improving her diet She confirms a weight loss of 3+ lbs Discussed BMI and reviewed goal of 175 lbs Presently at 206 lbs on 11/12/21 Reports a loss of 3 lbs in 2-3 weeks Assessed blood sugar values. Reports this morning CM of 156       obtain a new shower chair with back [facility]) (pt-stated)   On track     10/21/21 Care Coordination Interventions: Assessed for delivery of durable medical equipment (DME) Updated patient on the care coordination of notifying Dr Moshe Cipro of her need for new shower chair with back order needed via secure chat and in basket message.  Confirmed patient received a call for the a new shower chair with back from a DME agency. She was informed there would be a charge. She reports declining the offer to get the equipment from the DME agency She prefers to use her Over the counter (OTC) benefit She reports  she will use this benefit with her $65 credit to purchase a shower chair        Our next appointment is by telephone on 11/27/21 at 1000  Please call the care guide team at [phone] if you need to cancel or reschedule your appointment.   If you are experiencing a Mental Health or [place] or need someone to talk to, please call the Suicide and Crisis Lifeline: 988 call the [place] National Suicide Prevention Lifeline: (225)247-0588 or TTY: (519) 224-4987 TTY (850)246-9858) to talk to a trained counselor call 1-800-273-TALK (toll free, 24 hour hotline) call the [facility]: (418) 491-8918 call 911   The patient verbalized understanding of instructions, educational materials, and care plan provided today and DECLINED offer to receive copy of patient instructions, educational materials, and care plan.   The patient has been provided with contact information for the care management team and has been advised to call with any health related questions or concerns.   [place] Lavina Hamman, RN, BSN, [place] Coordinator Office number [phone]

## 2021-11-27 ENCOUNTER — Ambulatory Visit: Payer: Self-pay | Admitting: *Deleted

## 2021-11-27 NOTE — Patient Outreach (Signed)
  Care Coordination   Follow Up Visit Note   12/09/2021 Name: Stacey Hanson MRN: [id number] DOB: 1946-01-16  Stacey Hanson is a 76 y.o. year old female who sees Fayrene Helper, MD for primary care. I spoke with  Stacey Hanson by phone today.  What matters to the patients health and wellness today?  Podiatry Dr Patsi [place]  cut nails every 10 weeks broke left big toe 2022 in her bedroom status post percutaneous pin fixation and reduction of the proximal phalanx of the left great toe prego no sugar morning [employer] meatballs   Goals Addressed               This Visit's Progress     Patient Stated     Decrease side effects related possibly from medicine causing myalgia [facility]) (pt-stated)        Care Coordination Interventions: Re activated Goal 11/13/21. Modified to address Myalgias possibly related to medication side effects Discussed strategies to manage statin-induced myalgias Discussed the medications with known side effects of myalgias Will collaborated with pcp and/or pharmacy to see if patient is able to have medications adjusted or changed to medicine with less side effects Have more cramps during the night from 1-3 am when she is trying to sleep Discussed mustard       Keep blood sugar down ([facility]) (pt-stated)         Care Coordination Interventions: Discussed plans with patient for ongoing care management follow up and provided patient with direct contact information for care management team Discussed weight loss as beneficial for diabetes management.  Confirm she is improving her diet She confirms a weight loss of 3+ lbs Discussed BMI and reviewed goal of 175 lbs Presently at 206 lbs on 11/12/21 Reports a loss of 3 lbs in 2-3 weeks Assessed blood sugar values. Reports this morning CM of 156 11/27/21 150 + goes up in evening to try natural foods like vinegar, cinnamon, lemon       obtain a new shower chair with back ([facility]) (pt-stated)   Not on track     Care  Coordination Interventions: Assessed for delivery of durable medical equipment (DME) Updated patient on the care coordination of notifying Dr Moshe Cipro of her need for new shower chair with back order needed via secure chat and in basket message.  Confirmed patient received a call for the a new shower chair with back from a DME agency. She was informed there would be a charge. She reports declining the offer to get the equipment from the DME agency She prefers to use her Over the counter (OTC) benefit She reports she will use this benefit with her $65 credit to purchase a shower chair She has not ordered yet waiting for next OTC quarter        SDOH assessments and interventions completed:  No     Care Coordination Interventions Activated:  Yes  Care Coordination Interventions:  Yes, provided   Follow up plan: Follow up call scheduled for 12/11/21 2:30 pm    Encounter Outcome:  Pt. Visit Completed   Jahzara Slattery L. Lavina Hamman, RN, BSN, [place] Coordinator Office number [phone]

## 2021-12-01 ENCOUNTER — Other Ambulatory Visit: Payer: Self-pay | Admitting: Family Medicine

## 2021-12-03 DIAGNOSIS — M79672 Pain in left foot: Secondary | ICD-10-CM | POA: Diagnosis not present

## 2021-12-03 DIAGNOSIS — M79674 Pain in right toe(s): Secondary | ICD-10-CM | POA: Diagnosis not present

## 2021-12-03 DIAGNOSIS — E1151 Type 2 diabetes mellitus with diabetic peripheral angiopathy without gangrene: Secondary | ICD-10-CM | POA: Diagnosis not present

## 2021-12-03 DIAGNOSIS — M79675 Pain in left toe(s): Secondary | ICD-10-CM | POA: Diagnosis not present

## 2021-12-03 DIAGNOSIS — M79671 Pain in right foot: Secondary | ICD-10-CM | POA: Diagnosis not present

## 2021-12-09 ENCOUNTER — Telehealth: Payer: Self-pay | Admitting: *Deleted

## 2021-12-09 NOTE — Patient Instructions (Signed)
Visit Information  Thank you for taking time to visit with me today. Please don't hesitate to contact me if I can be of assistance to you.   Following are the goals we discussed today:   Goals Addressed               This Visit's Progress     Patient Stated     Decrease side effects related possibly from medicine causing myalgia [facility]) (pt-stated)        Care Coordination Interventions: Re activated Goal 11/13/21. Modified to address Myalgias possibly related to medication side effects Discussed strategies to manage statin-induced myalgias Discussed the medications with known side effects of myalgias Will collaborated with pcp and/or pharmacy to see if patient is able to have medications adjusted or changed to medicine with less side effects Have more cramps during the night from 1-3 am when she is trying to sleep Discussed mustard       Keep blood sugar down ([facility]) (pt-stated)         Care Coordination Interventions: Discussed plans with patient for ongoing care management follow up and provided patient with direct contact information for care management team Discussed weight loss as beneficial for diabetes management.  Confirm she is improving her diet She confirms a weight loss of 3+ lbs Discussed BMI and reviewed goal of 175 lbs Presently at 206 lbs on 11/12/21 Reports a loss of 3 lbs in 2-3 weeks Assessed blood sugar values. Reports this morning CM of 156 11/27/21 150 + goes up in evening to try natural foods like vinegar, cinnamon, lemon       obtain a new shower chair with back ([facility]) (pt-stated)   Not on track     Care Coordination Interventions: Assessed for delivery of durable medical equipment (DME) Updated patient on the care coordination of notifying Dr Moshe Cipro of her need for new shower chair with back order needed via secure chat and in basket message.  Confirmed patient received a call for the a new shower chair with back from a DME agency. She was informed there  would be a charge. She reports declining the offer to get the equipment from the DME agency She prefers to use her Over the counter (OTC) benefit She reports she will use this benefit with her $65 credit to purchase a shower chair She has not ordered yet waiting for next OTC quarter        Our next appointment is by telephone on 12/11/21 at 2:30 pm  Please call the care guide team at [phone] if you need to cancel or reschedule your appointment.   If you are experiencing a Mental Health or [place] or need someone to talk to, please call the Suicide and Crisis Lifeline: 988 call the [place] National Suicide Prevention Lifeline: 209-100-5703 or TTY: (339)345-4886 TTY 707-829-0664) to talk to a trained counselor call 1-800-273-TALK (toll free, 24 hour hotline) call the [facility]: [phone] call 911   The patient verbalized understanding of instructions, educational materials, and care plan provided today and DECLINED offer to receive copy of patient instructions, educational materials, and care plan.   The patient has been provided with contact information for the care management team and has been advised to call with any health related questions or concerns.   [place] Lavina Hamman, RN, BSN, [place] Coordinator Office number [phone]

## 2021-12-09 NOTE — Patient Outreach (Signed)
  Care Coordination   Collaboration  Visit Note   12/09/2021 Name: Stacey Hanson MRN: [id number] DOB: May 30, 1945  Stacey Hanson is a 76 y.o. year old female who sees Stacey Helper, MD for primary care. I  attempted to outreach to primary care provider office. Office closed   What matters to the patients health and wellness today?  Myalgia related to hypertension medication Requesting a change if possible. Presently on Pravastin      Goals Addressed               This Visit's Progress     Patient Stated     Decrease side effects related possibly from medicine causing myalgia [facility]) (pt-stated)        Care Coordination Interventions: Re activated Goal 11/13/21. Modified to address Myalgias possibly related to medication side effects Discussed strategies to manage statin-induced myalgias Discussed the medications with known side effects of myalgias Will collaborated with pcp and/or pharmacy to see if patient is able to have medications adjusted or changed to medicine with less side effects Have more cramps during the night from 1-3 am when she is trying to sleep Discussed mustard  Unsuccessful Outreach attempt to pcp office to discuss voiced concerns with myalgia and request to change hypertension medicine        SDOH assessments and interventions completed:  No     Care Coordination Interventions Activated:  Yes  Care Coordination Interventions:  Yes, provided   Follow up plan: Follow up call scheduled for 12/11/21    Encounter Outcome:  Pt. Visit Completed   Stacey Brick L. Lavina Hamman, RN, BSN, [place] Coordinator Office number [phone]

## 2021-12-10 ENCOUNTER — Telehealth: Payer: Self-pay

## 2021-12-10 ENCOUNTER — Encounter: Payer: Self-pay | Admitting: Family Medicine

## 2021-12-10 ENCOUNTER — Other Ambulatory Visit: Payer: Self-pay | Admitting: Family Medicine

## 2021-12-10 ENCOUNTER — Telehealth: Payer: Self-pay | Admitting: *Deleted

## 2021-12-10 MED ORDER — ROSUVASTATIN CALCIUM 5 MG PO TABS: ORAL_TABLET | ORAL | 3 refills | Status: DC

## 2021-12-10 NOTE — Progress Notes (Signed)
Crestor 5

## 2021-12-10 NOTE — Telephone Encounter (Signed)
Spoke with Maudie Mercury with [facility] she advised patient is still having leg cramps from the pravastatin is there another medication you would be willing to try please advise ?  Ph# [phone]

## 2021-12-10 NOTE — Patient Outreach (Signed)
  Care Coordination   Collaboration   Visit Note   12/10/2021 Name: Stacey Hanson MRN: [id number] DOB: 05-Apr-1945  Stacey Hanson is a 76 y.o. year old female who sees Fayrene Helper, MD for primary care. I  outreached to the primary care provider office to collaborate with staff about patient cholesterol medicine  What matters to the patients health and wellness today?  Patient voiced concern of muscle cramps, on pravastatin,  patient interested in adjusting cholesterol medicine to one with less side effects Confirmed with Lovena Le the office presently does not have a pharmacy staff   Goals Addressed               This Visit's Progress     Patient Stated     Decrease side effects related possibly from medicine causing myalgia [facility]) (pt-stated)        Care Coordination Interventions: Re activated Goal 11/13/21. Modified to address Myalgias possibly related to medication side effects Discussed strategies to manage statin-induced myalgias Discussed the medications with known side effects of myalgias Will collaborated with pcp and/or pharmacy to see if patient is able to have medications adjusted or changed to medicine with less side effects Have more cramps during the night from 1-3 am when she is trying to sleep Collaboration outreach to primary care provider office.  Spoke with Lovena Le to assist to speak with nurse. Inquired about office pharmacy staff        SDOH assessments and interventions completed:  No     Care Coordination Interventions Activated:  Yes  Care Coordination Interventions:  Yes, provided   Follow up plan: Follow up call scheduled for 12/11/21    Encounter Outcome:  Pt. Visit Completed   Weslyn Holsonback L. Lavina Hamman, RN, BSN, [place] Coordinator Office number [phone]

## 2021-12-10 NOTE — Telephone Encounter (Signed)
Patient aware rx sent  

## 2021-12-11 ENCOUNTER — Ambulatory Visit: Payer: Self-pay | Admitting: *Deleted

## 2021-12-11 NOTE — Patient Outreach (Signed)
  Care Coordination   Follow Up Visit Note   12/12/2021 Name: Stacey Hanson MRN: [id number] DOB: 07-25-1945  Stacey Hanson is a 76 y.o. year old female who sees Stacey Helper, MD for primary care. I spoke with  Stacey Hanson by phone today.  What matters to the patients health and wellness today?  Confirmed her preference not to the Dexcom. She prefers to stick her finger   Patient reports she has experience further muscle cramping Patient states she is willing to try the Crestor recently ordered by her pcp She reports previously use of Zocor, presently taking Pravachol until she receives her Crestor via mail order pharmacy. She reports muscle cramping with Zocor & Pravachol   She and RN CM completed a conference call to her mail order pharmacy vendor, Stacey Hanson [phone]) The 90 day supply of Crestor was confirmed to have been shipped on 12/11/21 The Nexlizet is non formulary medicine and would need prior authorization by the MD  to [phone]   She wants all other medicines checked for muscle cramps side effects    Goals Addressed               This Visit's Progress     Patient Stated     Decrease side effects related possibly from medicine causing myalgia ([facility]) (pt-stated)   On track     Care Coordination Interventions: Re activated Goal 11/13/21. Modified to address Myalgias possibly related to medication side effects Discussed strategies to manage statin-induced myalgias Discussed the medications with known side effects of myalgias Discussed the collaboration with pcp and pcp nurse, Stacey Hanson to see if patient is able to have medications adjusted or changed to medicine with less side effects like Nexlizet Confirmed she has more cramps during the night from 1-3 am when she is trying to sleep Conference with patient to Stacey Hanson and spoke with Stacey Hanson to inquire if the medication, Crestor to be taken Mondays, Wednesdays and Fridays has been shipped. He confirms a  90 day supply of Crestor was shipped out 12/12/10 with no charge ($0) Confirmed patient will take Crestor to determine if she has less muscle cramps and re address concerns with pcp  if muscle cramps continue         SDOH assessments and interventions completed:  Yes  SDOH Interventions Today    Flowsheet Row Most Recent Value  SDOH Interventions   Food Insecurity Interventions Intervention Not Indicated  Utilities Interventions Intervention Not Indicated        Care Coordination Interventions Activated:  Yes  Care Coordination Interventions:  Yes, provided   Follow up plan: Follow up call scheduled for 01/01/22 2 pm    Encounter Outcome:  Pt. Visit Completed   Allie Gerhold L. Lavina Hamman, RN, BSN, Stacey Hanson Coordinator Office number [phone]

## 2021-12-12 NOTE — Patient Instructions (Signed)
Visit Information  Thank you for taking time to visit with me today. Please don't hesitate to contact me if I can be of assistance to you.   Following are the goals we discussed today:   Goals Addressed               This Visit's Progress     Patient Stated     Decrease side effects related possibly from medicine causing myalgia [facility]) (pt-stated)   On track     Care Coordination Interventions: Re activated Goal 11/13/21. 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 He confirms a 90 day supply of Crestor was shipped out 12/12/10 with no charge ($0) Confirmed patient will take Crestor to determine if she has less muscle cramps and re address concerns with pcp  if muscle cramps continue         Our next appointment is by telephone on 01/01/22 at 20pm  Please call the care guide team at [phone] if you need to cancel or reschedule your appointment.   If you are experiencing a Mental Health or [place] or need someone to talk to, please call the Suicide and Crisis Lifeline: 988 call the [place] National Suicide Prevention Lifeline: (864)630-2775 or TTY: 437-541-4596 TTY 450 207 8545) to talk to a trained counselor call 1-800-273-TALK (toll free, 24 hour hotline) call the [facility]: [phone] call 911   The patient verbalized understanding of instructions, educational materials, and care  plan provided today and DECLINED offer to receive copy of patient instructions, educational materials, and care plan.   The patient has been provided with contact information for the care management team and has been advised to call with any health related questions or concerns.   Laurren Lepkowski L. Lavina Hamman, RN, BSN, [place] Coordinator Office number [phone]

## 2022-01-01 ENCOUNTER — Ambulatory Visit: Payer: Self-pay | Admitting: *Deleted

## 2022-01-02 NOTE — Patient Outreach (Signed)
  Care Coordination   Follow Up Visit Note   04/15/2022 late entry for 01/02/22 Name: Kieu Quiggle MRN: [id number] DOB: 01-Sep-1945  Shahrzad Koble is a 76 y.o. year old female who sees Fayrene Helper, MD for primary care. I spoke with  [name] by phone today.  What matters to the patients health and wellness today?  Cholesterol medicine has not caused side effects, tolerating Crestor on Mondays, Wednesdays and Fridays as ordered Diabetes being maintain  constipation home management discussed   Goals Addressed   None     SDOH assessments and interventions completed:  No     Care Coordination Interventions Activated:  Yes  Care Coordination Interventions:  Yes, provided   Follow up plan: Follow up call scheduled for 02/05/22    Encounter Outcome:  Pt. Visit Completed   Josph Norfleet L. Lavina Hamman, RN, BSN, [place] Coordinator Office number [phone]

## 2022-01-02 NOTE — Patient Instructions (Signed)
Visit Information  Thank you for taking time to visit with me today. Please don't hesitate to contact me if I can be of assistance to you.   Following are the goals we discussed today:   Goals Addressed   None     Our next appointment is by telephone on 02/05/22 at 1  Please call the care guide team at [phone] if you need to cancel or reschedule your appointment.   If you are experiencing a Mental Health or [place] or need someone to talk to, please call the Suicide and Crisis Lifeline: 988 call the [place] National Suicide Prevention Lifeline: 619-415-1539 or TTY: 229-616-4610 TTY 340 599 1466) to talk to a trained counselor call 1-800-273-TALK (toll free, 24 hour hotline) call the [facility]: (564)[phone] call 911   The patient verbalized understanding of instructions, educational materials, and care plan provided today and DECLINED offer to receive copy of patient instructions, educational materials, and care plan.   The patient has been provided with contact information for the care management team and has been advised to call with any health related questions or concerns.   Lameshia Hypolite L. Lavina Hamman, RN, BSN, [place] Coordinator Office number [phone]

## 2022-01-10 ENCOUNTER — Other Ambulatory Visit: Payer: Self-pay | Admitting: Family Medicine

## 2022-01-10 DIAGNOSIS — E1165 Type 2 diabetes mellitus with hyperglycemia: Secondary | ICD-10-CM

## 2022-01-15 ENCOUNTER — Other Ambulatory Visit: Payer: Self-pay | Admitting: Family Medicine

## 2022-01-19 ENCOUNTER — Other Ambulatory Visit: Payer: Self-pay | Admitting: Family Medicine

## 2022-02-05 ENCOUNTER — Ambulatory Visit: Payer: Self-pay | Admitting: *Deleted

## 2022-02-05 NOTE — Progress Notes (Signed)
[facility] Quality Team Note  Name: Stacey Hanson Date of Birth: [birth year] MRN: [id number] Date: 02/05/2022  [facility] Quality Team has reviewed this patient's chart, please see recommendations below:  [facility] Quality Other; KED: Kidney Health Evaluation Gap- Patient needs Urine Albumin Creatinine Ratio Test completed for gap closure. EGFR portion has already been completed.

## 2022-02-05 NOTE — Patient Outreach (Signed)
Care Coordination   Follow Up Visit Note   02/25/2022 late entry for 02/05/22 Name: Stacey Hanson MRN: [id number] DOB: 1945-12-06  Stacey Hanson is a 76 y.o. year old female who sees Fayrene Helper, MD for primary care. I spoke with  [name] by phone today.  What matters to the patients health and wellness today?  Hyperlipidemia- still having muscle cramps but better compared to     Constipation- Decrease intake of vegetables and fruits during the Thanksgiving holiday   Goals Addressed               This Visit's Progress     Patient Stated     Decrease side effects related possibly from medicine causing myalgia [facility]) (pt-stated)   On track     Care Coordination Interventions: Re activated Goal 11/13/21. 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 He confirms a 90 day supply of Crestor was shipped out 12/12/10 with no charge ($0) Confirmed patient will take Crestor to determine if she has less muscle cramps and re address concerns with pcp  if muscle cramps continue       Keep blood sugar down ([facility]) (pt-stated)   On track      Care Coordination Interventions: Discussed plans with patient for ongoing care management follow up and provided patient with direct contact information for care management team Discussed weight loss as beneficial for diabetes management.  Confirm she is improving her diet She confirms a  weight loss of 3+ lbs Discussed BMI and reviewed goal of 175 lbs Presently at 206 lbs on 11/12/21 Reports a loss of 3 lbs in 2-3 weeks Assessed blood sugar values. Reports this morning CM of 156 11/27/21 150 + goes up in evening to try natural foods like vinegar, cinnamon, lemon       Manage constipation ([facility]) (pt-stated)   Not on track     Care Coordination Interventions: Evaluation of current treatment plan related to constipation and patient's adherence to plan as established by provider Provided education to patient re: bowel elimination, not straining will cause a heart attack, magnesium citrate, dulcolax, colace Reviewed medications with patient and discussed over the counter bowel elimination medicines ,  Discussed importance of drinking a glass of fluids after her glass of metamucil and miralax Confirmed she is not taking narcotics and discussed narcotics related to constipation       obtain a new shower chair with back ([facility]) (pt-stated)   On track     Care Coordination Interventions: Assessed for delivery of durable medical equipment (DME) Updated patient on the care coordination of notifying Dr Moshe Cipro of her need for new shower chair with back order needed via secure chat and in basket message.  Confirmed patient received a call for the a new shower chair with back from a DME agency. She was informed there would be a charge. She reports declining the offer to get the equipment from the DME agency She prefers to use  her Over the counter (OTC) benefit She reports she will use this benefit with her $65 credit to purchase a shower chair She has not ordered yet waiting for next OTC quarter        SDOH assessments and interventions completed:  No     Care Coordination Interventions:  Yes, provided   Follow up plan: Follow up call scheduled for 03/10/22    Encounter Outcome:  Pt. Visit Completed   Elaiza Shoberg L. Lavina Hamman, RN, BSN, [place] Coordinator Office number [phone]

## 2022-02-17 ENCOUNTER — Other Ambulatory Visit: Payer: Self-pay | Admitting: Family Medicine

## 2022-02-18 DIAGNOSIS — M79675 Pain in left toe(s): Secondary | ICD-10-CM | POA: Diagnosis not present

## 2022-02-18 DIAGNOSIS — M79674 Pain in right toe(s): Secondary | ICD-10-CM | POA: Diagnosis not present

## 2022-02-18 DIAGNOSIS — E1151 Type 2 diabetes mellitus with diabetic peripheral angiopathy without gangrene: Secondary | ICD-10-CM | POA: Diagnosis not present

## 2022-02-18 DIAGNOSIS — M79671 Pain in right foot: Secondary | ICD-10-CM | POA: Diagnosis not present

## 2022-02-18 DIAGNOSIS — M79672 Pain in left foot: Secondary | ICD-10-CM | POA: Diagnosis not present

## 2022-02-25 NOTE — Patient Instructions (Signed)
Visit Information  Thank you for taking time to visit with me today. Please don't hesitate to contact me if I can be of assistance to you.   Following are the goals we discussed today:   Goals Addressed               This Visit's Progress     Patient Stated     Decrease side effects related possibly from medicine causing myalgia [facility]) (pt-stated)   On track     Care Coordination Interventions: Re activated Goal 11/13/21. 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 He confirms a 90 day supply of Crestor was shipped out 12/12/10 with no charge ($0) Confirmed patient will take Crestor to determine if she has less muscle cramps and re address concerns with pcp  if muscle cramps continue       Keep blood sugar down ([facility]) (pt-stated)   On track      Care Coordination Interventions: Discussed plans with patient for ongoing care management follow up and provided patient with direct contact information for care management team Discussed weight loss as beneficial for diabetes management.  Confirm she is improving her diet She confirms a weight loss of 3+ lbs Discussed BMI and reviewed goal of 175 lbs Presently at 206 lbs on 11/12/21 Reports a loss of 3 lbs in 2-3 weeks Assessed blood sugar values. Reports this morning CM of 156 11/27/21 150 + goes up in evening to try natural foods like vinegar, cinnamon, lemon       Manage constipation  ([facility]) (pt-stated)   Not on track     Care Coordination Interventions: Evaluation of current treatment plan related to constipation and patient's adherence to plan as established by provider Provided education to patient re: bowel elimination, not straining will cause a heart attack, magnesium citrate, dulcolax, colace Reviewed medications with patient and discussed over the counter bowel elimination medicines ,  Discussed importance of drinking a glass of fluids after her glass of metamucil and miralax Confirmed she is not taking narcotics and discussed narcotics related to constipation       obtain a new shower chair with back ([facility]) (pt-stated)   On track     Care Coordination Interventions: Assessed for delivery of durable medical equipment (DME) Updated patient on the care coordination of notifying Dr Moshe Cipro of her need for new shower chair with back order needed via secure chat and in basket message.  Confirmed patient received a call for the a new shower chair with back from a DME agency. She was informed there would be a charge. She reports declining the offer to get the equipment from the DME agency She prefers to use her Over the counter (OTC) benefit She reports she will use this benefit with her $65 credit to purchase a shower chair She has not ordered yet waiting for next OTC quarter        Our next appointment is by telephone on 03/10/22 at 2  pm  Please call the care guide team at [phone] if you need to cancel or reschedule your appointment.   If you are experiencing a Mental Health or [place] or need someone to talk to, please call the Suicide and Crisis Lifeline: 988 call the [place] National Suicide Prevention Lifeline: 306-105-3828 or TTY: 380-700-0212 TTY 504-870-7450) to talk to a trained counselor call 1-800-273-TALK (toll free, 24 hour hotline) call the [facility]: 670-634-3208 call 911   The patient verbalized understanding of  instructions, educational materials, and care plan provided today and DECLINED offer to receive copy of patient instructions, educational materials, and care plan.   The patient has been provided with contact information for the care management team and has been advised to call with any health related questions or concerns.    Kennia Vanvorst L. Lavina Hamman, RN, BSN, [place] Coordinator Office number [phone]

## 2022-03-05 ENCOUNTER — Other Ambulatory Visit: Payer: Self-pay | Admitting: Family Medicine

## 2022-03-09 ENCOUNTER — Telehealth: Payer: Self-pay | Admitting: *Deleted

## 2022-03-09 ENCOUNTER — Ambulatory Visit (INDEPENDENT_AMBULATORY_CARE_PROVIDER_SITE_OTHER): Payer: Medicare Other

## 2022-03-09 VITALS — BP 137/79 | HR 112 | Ht 67.0 in | Wt 210.1 lb

## 2022-03-09 DIAGNOSIS — Z0000 Encounter for general adult medical examination without abnormal findings: Secondary | ICD-10-CM

## 2022-03-09 NOTE — Patient Instructions (Signed)
[name] , Thank you for taking time to come for your Medicare Wellness Visit. I appreciate your ongoing commitment to your health goals. Please review the following plan we discussed and let me know if I can assist you in the future.   These are the goals we discussed:  Goals       Decrease side effects related possibly from medicine causing myalgia [facility]) (pt-stated)      Care Coordination Interventions: Re activated Goal 11/13/21. 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 He confirms a 90 day supply of Crestor was shipped out 12/12/10 with no charge ($0) Confirmed patient will take Crestor to determine if she has less muscle cramps and re address concerns with pcp  if muscle cramps continue       Increase physical activity      Pt states she has not increased activity due to covid.      Keep blood sugar down ([facility]) (pt-stated)       Care Coordination Interventions: Discussed plans with patient for ongoing care management follow up and provided patient with direct contact information for care management team Discussed weight loss as beneficial for diabetes management.  Confirm she is improving her diet She confirms a weight loss of 3+ lbs Discussed BMI and reviewed goal of 175 lbs Presently at 206 lbs on 11/12/21 Reports a loss of 3 lbs in 2-3 weeks Assessed blood sugar values. Reports this morning CM of 156 11/27/21 150 + goes  up in evening to try natural foods like vinegar, cinnamon, lemon       Manage constipation ([facility]) (pt-stated)      Care Coordination Interventions: Evaluation of current treatment plan related to constipation and patient's adherence to plan as established by provider Provided education to patient re: bowel elimination, not straining will cause a heart attack, magnesium citrate, dulcolax, colace Reviewed medications with patient and discussed over the counter bowel elimination medicines ,  Discussed importance of drinking a glass of fluids after her glass of metamucil and miralax Confirmed she is not taking narcotics and discussed narcotics related to constipation       Medication Management      Patient Goals/Self-Care Activities Patient will:  Focus on medication adherence by keeping up with prescription refills and either using a pill box or reminders to take your medications at the prescribed times Check blood sugar twice a day at the following times: fasting (at least 8 hours since last food consumption), 5-15 minutes before dinner, and whenever patient experiences symptoms of hypo/hyperglycemia, document, and provide at future appointments Collaborate with provider on medication access solutions Engage in dietary modifications by fewer sweetened foods & beverages         obtain a new shower chair with back ([facility]) (pt-stated)      Care Coordination Interventions: Assessed for delivery of durable medical equipment (DME) Updated patient on the care coordination of notifying Dr Moshe Cipro of her need for new shower chair with  back order needed via secure chat and in basket message.  Confirmed patient received a call for the a new shower chair with back from a DME agency. She was informed there would be a charge. She reports declining the offer to get the equipment from the DME agency She prefers to use her Over the counter (OTC) benefit She reports she will use this benefit with her $65 credit  to purchase a shower chair She has not ordered yet waiting for next OTC quarter      Patient Stated      Want to get my knees well  Pt states her knees are better      Patient Stated (pt-stated)      None        This is a list of the screening recommended for you and due dates:  Health Maintenance  Topic Date Due   Yearly kidney health urinalysis for diabetes  06/26/2021   COVID-19 Vaccine (6 - 2023-24 season) 10/31/2021   Hemoglobin A1C  05/13/2022   Eye exam for diabetics  05/22/2022   Complete foot exam   06/12/2022   Yearly kidney function blood test for diabetes  08/05/2022   Medicare Annual Wellness Visit  03/10/2023   DTaP/Tdap/Td vaccine (2 - Td or Tdap) 12/17/2030   Pneumonia Vaccine  Completed   Flu Shot  Completed   DEXA scan (bone density measurement)  Completed   Hepatitis C Screening: USPSTF Recommendation to screen - Ages 67-79 yo.  Completed   Zoster (Shingles) Vaccine  Completed   HPV Vaccine  Aged Out   Colon Cancer Screening  Discontinued

## 2022-03-09 NOTE — Progress Notes (Signed)
  Care Coordination Note  03/09/2022 Name: Stacey Hanson MRN: [id number] DOB: 1945/10/15  Stacey Hanson is a 77 y.o. year old female who is a primary care patient of Fayrene Helper, MD and is actively engaged with the care management team. I reached out to [name] by phone today to assist with re-scheduling a follow up visit with the RN Case Manager  Follow up plan: Telephone appointment with care management team member scheduled for:03/31/22  [place]  Direct Dial: [phone]

## 2022-03-09 NOTE — Progress Notes (Signed)
  Care Coordination Note  03/09/2022 Name: [name] MRN: [id number] DOB: May 05, 1945  Stacey Hanson is a 77 y.o. year old female who is a primary care patient of Fayrene Helper, MD and is actively engaged with the care management team. I reached out to [name] by phone today to assist with re-scheduling a follow up visit with the RN Case Manager  Follow up plan: Unsuccessful telephone outreach attempt made.    [place]  Direct Dial: [phone]

## 2022-03-09 NOTE — Progress Notes (Signed)
Subjective:   Reene Harlacher is a 77 y.o. female who presents for Medicare Annual (Subsequent) preventive examination.  Review of Systems           Objective:    There were no vitals filed for this visit. There is no height or weight on file to calculate BMI.     10/30/2021   12:44 PM 02/25/2021    8:24 AM 02/20/2021    3:17 PM 12/28/2020    9:33 PM 09/18/2020    9:23 AM 02/15/2020   10:03 AM 03/27/2019    8:50 AM  Advanced Directives  Does Patient Have a Medical Advance Directive? No No Yes No No No No  Does patient want to make changes to medical advance directive?  Yes (ED - Information included in AVS)       Would patient like information on creating a medical advance directive? No - Patient declined No - Patient declined  No - Patient declined No - Patient declined No - Patient declined No - Patient declined    Current Medications (verified) Outpatient Encounter Medications as of 03/09/2022  Medication Sig   alendronate (FOSAMAX) 70 MG tablet TAKE 1 TABLET BY MOUTH WEEKLY  WITH 8 OZ OF PLAIN WATER 30  MINUTES BEFORE FIRST FOOD, DRINK OR MEDS. STAY UPRIGHT FOR 30  MINS   amLODipine-benazepril (LOTREL) 5-40 MG capsule TAKE 1 CAPSULE BY MOUTH DAILY   aspirin EC 81 MG tablet Take 81 mg by mouth daily.   benazepril (LOTENSIN) 40 MG tablet    Blood Glucose Monitoring Suppl (ONE TOUCH ULTRA 2) w/Device KIT Twice daily testing dx e11.65   Calcium Carb-Cholecalciferol (CALCIUM 600/VITAMIN D3 PO) Take 2 tablets by mouth daily.   docusate sodium (COLACE) 100 MG capsule 100 mg by oral route. (Patient not taking: Reported on 02/05/2022)   hydrochlorothiazide (HYDRODIURIL) 25 MG tablet TAKE 1 TABLET BY MOUTH DAILY   insulin degludec (TRESIBA FLEXTOUCH) 100 UNIT/ML FlexTouch Pen Inject 60 Units into the skin at bedtime.   Insulin Pen Needle (B-D UF III MINI PEN NEEDLES) 31G X 5 MM MISC USE FOR TWICE DAILY INJECTIONS OF HUMALOG   Lancets (ONETOUCH DELICA PLUS [id number]) MISC USE TWICE  DAILY TO CHECK BLOOD  SUGAR   levothyroxine (SYNTHROID) 88 MCG tablet TAKE 1 TABLET BY MOUTH DAILY  BEFORE BREAKFAST   MODERNA COVID-19 BIVALENT 50 MCG/0.5ML injection    Multiple Vitamin (MULTIVITAMIN) capsule Take 1 capsule by mouth daily.   omeprazole (PRILOSEC) 20 MG capsule TAKE 1 CAPSULE BY MOUTH IN THE  MORNING   ONETOUCH ULTRA test strip USE TO CHECK BLOOD SUGAR TWICE  DAILY   polyethylene glycol (MIRALAX / GLYCOLAX) 17 g packet Take 17 g by mouth as needed.   PREVNAR 20 0.5 ML injection    psyllium (METAMUCIL) 58.6 % packet Take 1 packet by mouth as needed.   rosuvastatin (CRESTOR) 5 MG tablet TAKE 1 TABLET BY MOUTH 3 TIMES  WEEKLY EVERY MONDAY WEDNESDAY  AND FRIDAY   Semaglutide,0.25 or 0.'5MG'$ /DOS, (OZEMPIC, 0.25 OR 0.5 MG/DOSE,) 2 MG/1.5ML SOPN Inject 0.5 mg into the skin once a week. Inject 0.25 mg SQ weekly x 2 weeks, then advance to 0.5 mg SQ weekly thereafter if tolerated well   UNABLE TO FIND Diabetic shoes x 1 pair, Inserts x 3 pair DX E11.9   No facility-administered encounter medications on file as of 03/09/2022.    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    Knee pain    Obesity    Past Surgical History:  Procedure Laterality Date   ABDOMINAL HYSTERECTOMY  03/02/1968   CATARACT EXTRACTION W/PHACO Left 03/09/2019   Procedure: CATARACT EXTRACTION PHACO AND INTRAOCULAR LENS PLACEMENT ([place]);  Surgeon: Baruch Goldmann, MD;  Location: [facility];  Service: Ophthalmology;  Laterality: Left;  CDE: 9.08   CATARACT EXTRACTION W/PHACO Right 03/27/2019   Procedure: CATARACT EXTRACTION PHACO AND INTRAOCULAR  LENS PLACEMENT RIGHT EYE;  Surgeon: Baruch Goldmann, MD;  Location: [facility];  Service: Ophthalmology;  Laterality: Right;  CDE: 7.47   CHOLECYSTECTOMY  OCT 2010 BZ BILIARY DYSKINESIA   CHRONIC CHOLECYSTITIS   COLONOSCOPY  MAY 2009 SCREENING   pTC TICS, [facility]   COLONOSCOPY N/A 08/11/2017   Procedure: COLONOSCOPY;  Surgeon: Danie Binder, MD;  Location: [facility];  Service: Endoscopy;  Laterality: N/A;  9:30am   ESOPHAGOGASTRODUODENOSCOPY  10/03/2010   Procedure: ESOPHAGOGASTRODUODENOSCOPY (EGD);  Surgeon: Dorothyann Peng, MD;  Location: [facility];  Service: Endoscopy;  Laterality: N/A;   ESOPHAGOGASTRODUODENOSCOPY  12/19/2002   RMR: Normal esophagus/Normal stomach/ Duodenum large bulbar diverticulum, 1 cm bulbar ulcer with surrounding/ inflammation as described above. Normal D2   FOOT SURGERY Left 01/29/2021   POLYPECTOMY  08/11/2017   Procedure: POLYPECTOMY;  Surgeon: Danie Binder, MD;  Location: [facility];  Service: Endoscopy;;  cecal    SAVORY DILATION  10/03/2010   Procedure: SAVORY DILATION;  Surgeon: Dorothyann Peng, MD;  Location: [facility];  Service: Endoscopy;;   TOTAL ABDOMINAL HYSTERECTOMY  03/02/1968   TOTAL KNEE ARTHROPLASTY Left 12/21/2017   Procedure: LEFT TOTAL KNEE ARTHROPLASTY;  Surgeon: Paralee Cancel, MD;  Location: [facility];  Service: Orthopedics;  Laterality: Left;  70 mins block   UPPER GASTROINTESTINAL ENDOSCOPY  OCT 2004 RMR   DUO TIC, & ULCER   Family History  Problem Relation Age of Onset   Diabetes Mother    Hypertension Mother    Heart failure Mother    Cancer Father 66       prostate and colon   Diabetes Sister    Hypertension Sister    Hypertension Sister    Diabetes Sister    Diabetes Brother    Hypertension Brother    Diabetes Brother    Hypertension Brother    Coronary artery disease Brother 10       MI   Alcohol abuse Son    Colon cancer Neg Hx    Colon polyps Neg Hx    Social History   Socioeconomic History   Marital  status: Single    Spouse name: Not on file   Number of children: 3   Years of education: 10   Highest education level: 10th grade  Occupational History   Occupation: [employer]-OR   Occupation: Retired   Tobacco Use   Smoking status: Former    Packs/day: 0.25    Years: 10.00    Total pack years: 2.50    Types: Cigarettes    Quit date: 02/20/1985    Years since quitting: 37.0   Smokeless tobacco: Never  Vaping Use  Vaping Use: Never used  Substance and Sexual Activity   Alcohol use: No    Comment: stopped drinking 1998   Drug use: No   Sexual activity: Not Currently  Other Topics Concern   Not on file  Social History Narrative   Lives alone and she retired from [employer] Strain: [place]  (10/30/2021)   Overall Financial Resource Strain (CARDIA)    Difficulty of Paying Living Expenses: Not very hard  Food Insecurity: No Food Insecurity (12/12/2021)   Hunger Vital Sign    Worried About Running Out of Food in the Last Year: Never true    Ran Out of Food in the Last Year: Never true  Transportation Needs: Unmet Transportation Needs (10/16/2021)   PRAPARE - Hydrologist (Medical): Yes    Lack of Transportation (Non-Medical): No  Physical Activity: Inactive (02/25/2021)   Exercise Vital Sign    Days of Exercise per Week: 0 days    Minutes of Exercise per Session: 0 min  Stress: No Stress Concern Present (10/30/2021)   [place]    Feeling of Stress : Only a little  Social Connections: Moderately Isolated (02/25/2021)   Social Connection and Isolation Panel [NHANES]    Frequency of Communication with Friends and Family: More than three times a week    Frequency of Social Gatherings with Friends and Family: More than three times a week    Attends Religious Services: Never    Marine scientist or Organizations: Yes     Attends Archivist Meetings: 1 to 4 times per year    Marital Status: Never married    Tobacco Counseling Counseling given: Not Answered   Clinical Intake:  Diabetic? Yes Nutrition Risk Assessment:  Has the patient had any N/V/D within the last 2 months?  No  Does the patient have any non-healing wounds?  No  Has the patient had any unintentional weight loss or weight gain?  No   Diabetes:  Is the patient diabetic?  Yes  If diabetic, was a CBG obtained today?  No  Did the patient bring in their glucometer from home?  No  How often do you monitor your CBG's? Twice daily.   Financial Strains and Diabetes Management:  Are you having any financial strains with the device, your supplies or your medication? No .  Does the patient want to be seen by Chronic Care Management for management of their diabetes?  No  Would the patient like to be referred to a Nutritionist or for Diabetic Management?  No   Diabetic Exams: Eyes:05/21/2021  Foot Exam:06/11/2021  Activities of Daily Living     No data to display           Patient Care Team: Fayrene Helper, MD as PCP - General [place] Lenise Herald, MD (Inactive) as PCP - Cardiology (Cardiology) Danie Binder, MD (Inactive) (Gastroenterology) Leta Baptist, MD as Attending Physician (Otolaryngology) Cassandria Anger, MD (Endocrinology) Carole Civil, MD as Consulting Physician (Orthopedic Surgery) Madelin Headings, DO (Optometry) Bernell List, CPhT as [place] Management (Pharmacy Technician) Eloise Harman, DO as Consulting Physician (Internal Medicine) Beryle Lathe, [facility] (Inactive) (Pharmacist) Barbaraann Faster, RN as [place] Management Cristal Deer, DPM as Attending Physician (Podiatry)  Indicate any recent Medical Services you may have received from  other than [facility] providers in the past year (date may be approximate).      Assessment:   This is a routine wellness examination for Tala.  Hearing/Vision screen No results found.  Dietary issues and exercise activities discussed:     Goals Addressed   None   Depression Screen    12/12/2021    4:07 PM 10/29/2021   10:36 AM 10/16/2021    2:01 PM 06/11/2021   11:15 AM 03/12/2021    9:59 AM 02/25/2021    8:16 AM 12/10/2020   10:38 AM  PHQ 2/9 Scores  PHQ - 2 Score 0 0 1 0 0 0 1  PHQ- 9 Score       4    Fall Risk    12/12/2021    4:06 PM 10/29/2021   10:36 AM 06/11/2021   11:15 AM 03/12/2021   10:15 AM 03/12/2021    9:59 AM  Fall Risk   Falls in the past year? 0 0 0 1 1  Number falls in past yr: 0 0 0 0 0  Injury with Fall? 0 0 0 1 1  Risk for fall due to :  No Fall Risks No Fall Risks History of fall(s) Impaired balance/gait  Follow up Falls evaluation completed Falls evaluation completed Falls evaluation completed Falls evaluation completed Falls evaluation completed    [place]:  Any stairs in or around the home? No  If so, are there any without handrails? No  Home free of loose throw rugs in walkways, pet beds, electrical cords, etc? Yes  Adequate lighting in your home to reduce risk of falls? Yes   ASSISTIVE DEVICES UTILIZED TO PREVENT FALLS:  Life alert? No  Use of a cane, walker or w/c? No  Grab bars in the bathroom? Yes  Shower chair or bench in shower? Yes  Elevated toilet seat or a handicapped toilet? No         02/25/2021    8:31 AM 02/15/2020   10:05 AM 02/14/2019   10:19 AM 02/09/2018    1:56 PM 02/25/2017   10:57 AM  6CIT Screen  What Year? 0 points 0 points 0 points 0 points 0 points  What month? 0 points 0 points 0 points 0 points 0 points  What time? 0 points 0 points 0 points 0 points 0 points  Count back from 20 0 points 0 points 0 points 0 points 0 points  Months in reverse 0 points 0 points 0 points 0 points 0 points  Repeat phrase 2 points 0 points 0 points 0 points 0  points  Total Score 2 points 0 points 0 points 0 points 0 points    Immunizations Immunization History  Administered Date(s) Administered   Fluad Quad(high Dose 65+) 11/23/2018, 12/21/2019, 12/10/2020, 11/12/2021   Influenza,inj,Quad PF,6+ Mos 11/16/2012, 11/15/2013, 11/14/2014, 11/14/2015, 11/03/2016, 11/09/2017   Influenza,inj,quad, With Preservative 11/02/2016   Moderna SARS-COV2 Booster Vaccination 07/24/2020, 05/26/2021   Moderna Sars-Covid-2 Vaccination 04/27/2019, 05/26/2019, 01/08/2020   Pneumococcal Conjugate-13 10/11/2013   Pneumococcal Polysaccharide-23 10/07/2011   Tdap 12/16/2020   Zoster Recombinat (Shingrix) 12/16/2020   Zoster, Live 04/05/2006   Zoster, Unspecified 06/16/2021    TDAP status: Up to date  Flu Vaccine status: Up to date  Pneumococcal vaccine status: Up to date  Covid-19 vaccine status: Completed vaccines  Qualifies for Shingles Vaccine? Yes   Zostavax completed Yes   Shingrix Completed?: Yes  Screening Tests Health Maintenance  Topic Date Due  Diabetic kidney evaluation - Urine ACR  06/26/2021   COVID-19 Vaccine (6 - 2023-24 season) 10/31/2021   HEMOGLOBIN A1C  05/13/2022   OPHTHALMOLOGY EXAM  05/22/2022   FOOT EXAM  06/12/2022   Diabetic kidney evaluation - eGFR measurement  08/05/2022   Medicare Annual Wellness (AWV)  03/10/2023   DTaP/Tdap/Td (2 - Td or Tdap) 12/17/2030   Pneumonia Vaccine 59+ Years old  Completed   INFLUENZA VACCINE  Completed   DEXA SCAN  Completed   Hepatitis C Screening  Completed   Zoster Vaccines- Shingrix  Completed   HPV VACCINES  Aged Out   COLONOSCOPY (Pts 45-1yr Insurance coverage will need to be confirmed)  [place] Due  Topic Date Due   Diabetic kidney evaluation - Urine ACR  06/26/2021   COVID-19 Vaccine (6 - 2023-24 season) 10/31/2021    Colorectal cancer screening: No longer required.   Mammogram status: No longer required due to age.  Bone  Density status: Completed 05/22/2021. Results reflect: Bone density results: OSTEOPENIA. Repeat every 2 years.  Lung Cancer Screening: (Low Dose CT Chest recommended if Age 77-80years, 30 pack-year currently smoking OR have quit w/in 15years.) does not qualify.   Lung Cancer Screening Referral: No  Additional Screening:  Hepatitis C Screening: does qualify; Completed 04/17/2015  Vision Screening: Recommended annual ophthalmology exams for early detection of glaucoma and other disorders of the eye. Is the patient up to date with their annual eye exam?  Yes  Who is the provider or what is the name of the office in which the patient attends annual eye exams?  If pt is not established with a provider, would they like to be referred to a provider to establish care? No .   Dental Screening: Recommended annual dental exams for proper oral hygiene  Community Resource Referral / Chronic Care Management: CRR required this visit?  No   CCM required this visit?  No      Plan:     I have personally reviewed and noted the following in the patient's chart:   Medical and social history Use of alcohol, tobacco or illicit drugs  Current medications and supplements including opioid prescriptions. Patient is not currently taking opioid prescriptions. Functional ability and status Nutritional status Physical activity Advanced directives List of other physicians Hospitalizations, surgeries, and ER visits in previous 12 months Vitals Screenings to include cognitive, depression, and falls Referrals and appointments  In addition, I have reviewed and discussed with patient certain preventive protocols, quality metrics, and best practice recommendations. A written personalized care plan for preventive services as well as general preventive health recommendations were provided to patient.     KQuentin Angst CMonsey  03/09/2022

## 2022-03-10 ENCOUNTER — Encounter: Payer: Medicare Other | Admitting: *Deleted

## 2022-03-12 DIAGNOSIS — E782 Mixed hyperlipidemia: Secondary | ICD-10-CM | POA: Diagnosis not present

## 2022-03-12 DIAGNOSIS — I10 Essential (primary) hypertension: Secondary | ICD-10-CM | POA: Diagnosis not present

## 2022-03-12 DIAGNOSIS — E559 Vitamin D deficiency, unspecified: Secondary | ICD-10-CM | POA: Diagnosis not present

## 2022-03-13 LAB — CMP14+EGFR
ALT: 18 IU/L (ref 0–32)
AST: 22 IU/L (ref 0–40)
Albumin/Globulin Ratio: 1.5 (ref 1.2–2.2)
Albumin: 4.7 g/dL (ref 3.8–4.8)
Alkaline Phosphatase: 105 IU/L (ref 44–121)
BUN/Creatinine Ratio: 26 (ref 12–28)
BUN: 26 mg/dL (ref 8–27)
Bilirubin Total: 0.2 mg/dL (ref 0.0–1.2)
CO2: 25 mmol/L (ref 20–29)
Calcium: 9.5 mg/dL (ref 8.7–10.3)
Chloride: 98 mmol/L (ref 96–106)
Creatinine, Ser: 1.01 mg/dL — ABNORMAL HIGH (ref 0.57–1.00)
Globulin, Total: 3.2 g/dL (ref 1.5–4.5)
Glucose: 210 mg/dL — ABNORMAL HIGH (ref 70–99)
Potassium: 4.4 mmol/L (ref 3.5–5.2)
Sodium: 138 mmol/L (ref 134–144)
Total Protein: 7.9 g/dL (ref 6.0–8.5)
eGFR: 58 mL/min/{1.73_m2} — ABNORMAL LOW (ref >59)

## 2022-03-13 LAB — LIPID PANEL
Chol/HDL Ratio: 2.9 ratio (ref 0.0–4.4)
Cholesterol, Total: 161 mg/dL (ref 100–199)
HDL: 56 mg/dL (ref >39)
LDL Chol Calc (NIH): 93 mg/dL (ref 0–99)
Triglycerides: 62 mg/dL (ref 0–149)
VLDL Cholesterol Cal: 12 mg/dL (ref 5–40)

## 2022-03-13 LAB — CBC
Hematocrit: 39.3 % (ref 34.0–46.6)
Hemoglobin: 13.1 g/dL (ref 11.1–15.9)
MCH: 30.3 pg (ref 26.6–33.0)
MCHC: 33.3 g/dL (ref 31.5–35.7)
MCV: 91 fL (ref 79–97)
Platelets: 415 10*3/uL (ref 150–450)
RBC: 4.32 x10E6/uL (ref 3.77–5.28)
RDW: 12 % (ref 11.7–15.4)
WBC: 8.7 10*3/uL (ref 3.4–10.8)

## 2022-03-13 LAB — VITAMIN D 25 HYDROXY (VIT D DEFICIENCY, FRACTURES): Vit D, 25-Hydroxy: 36.5 ng/mL (ref 30.0–100.0)

## 2022-03-16 ENCOUNTER — Encounter: Payer: Medicare Other | Admitting: Family Medicine

## 2022-03-17 ENCOUNTER — Ambulatory Visit (INDEPENDENT_AMBULATORY_CARE_PROVIDER_SITE_OTHER): Payer: Medicare Other | Admitting: Family Medicine

## 2022-03-17 ENCOUNTER — Encounter: Payer: Self-pay | Admitting: Family Medicine

## 2022-03-17 VITALS — BP 125/73 | HR 106 | Ht 67.0 in | Wt 211.0 lb

## 2022-03-17 DIAGNOSIS — K1370 Unspecified lesions of oral mucosa: Secondary | ICD-10-CM | POA: Diagnosis not present

## 2022-03-17 DIAGNOSIS — L723 Sebaceous cyst: Secondary | ICD-10-CM | POA: Diagnosis not present

## 2022-03-17 DIAGNOSIS — Z0001 Encounter for general adult medical examination with abnormal findings: Secondary | ICD-10-CM | POA: Insufficient documentation

## 2022-03-17 DIAGNOSIS — E1159 Type 2 diabetes mellitus with other circulatory complications: Secondary | ICD-10-CM

## 2022-03-17 NOTE — Assessment & Plan Note (Signed)

## 2022-03-17 NOTE — Assessment & Plan Note (Signed)
Slowly enlarging growth on lower left floor of mouth, x 9 months, not painful, oral surgery to eval and manage

## 2022-03-17 NOTE — Patient Instructions (Addendum)
F/u in mid July , call if you need me sooner  You need Covid vaccine and RSV vaccine, both are at your pharmacy, do not get them on the same day, get them about 1 week apart , incase you feel slightly ill with either one of them  Microalb from office today  You are referred to Oral surgeon re mouth lesion  Please get dental appointment     Swelling on your upper back was a sebaceous cyst, this is noty an infection and is not cancer, m most of the material was squeezed out with pressure at the visit. Put a small amt of antibiotic ointment twice daily on skin in the area, ,  do not squeeze or pick at it, for the next 5 days  Excellent bP, cholesterol, blood count , vit SD and cholesterol level  It is important that you exercise regularly at least 30 minutes 5 times a week. If you develop chest pain, have severe difficulty breathing, or feel very tired, stop exercising immediately and seek medical attention   Careful not to fall Thanks for choosing [facility], we consider it a privelige to serve you.

## 2022-03-18 ENCOUNTER — Ambulatory Visit: Payer: Medicare Other | Admitting: Nurse Practitioner

## 2022-03-18 ENCOUNTER — Encounter: Payer: Self-pay | Admitting: Family Medicine

## 2022-03-18 ENCOUNTER — Encounter: Payer: Self-pay | Admitting: Nurse Practitioner

## 2022-03-18 VITALS — BP 116/75 | HR 109 | Ht 67.0 in | Wt 211.4 lb

## 2022-03-18 DIAGNOSIS — E1165 Type 2 diabetes mellitus with hyperglycemia: Secondary | ICD-10-CM | POA: Diagnosis not present

## 2022-03-18 DIAGNOSIS — I10 Essential (primary) hypertension: Secondary | ICD-10-CM | POA: Diagnosis not present

## 2022-03-18 DIAGNOSIS — L723 Sebaceous cyst: Secondary | ICD-10-CM | POA: Insufficient documentation

## 2022-03-18 DIAGNOSIS — E890 Postprocedural hypothyroidism: Secondary | ICD-10-CM

## 2022-03-18 LAB — POCT GLYCOSYLATED HEMOGLOBIN (HGB A1C): Hemoglobin A1C: 10.9 % — AB (ref 4.0–5.6)

## 2022-03-18 MED ORDER — SEMAGLUTIDE (1 MG/DOSE) 4 MG/3ML ~~LOC~~ SOPN: 1.0000 mg | PEN_INJECTOR | SUBCUTANEOUS | 3 refills | Status: DC

## 2022-03-18 NOTE — Assessment & Plan Note (Signed)
Significan amount of sebum extruded from cyst on lower thoracic left , no traumatic injury to skin, no infection present

## 2022-03-18 NOTE — Assessment & Plan Note (Signed)
Stacey Hanson is reminded of the importance of commitment to daily physical activity for 30 minutes or more, as able and the need to limit carbohydrate intake to 30 to 60 grams per meal to help with blood sugar control.   The need to take medication as prescribed, test blood sugar as directed, and to call between visits if there is a concern that blood sugar is uncontrolled is also discussed.   Stacey Hanson is reminded of the importance of daily foot exam, annual eye examination, and good blood sugar, blood pressure and cholesterol control.     Latest Ref Rng & Units 03/12/2022   10:53 AM 11/12/2021    1:25 PM 08/12/2021   10:54 AM 08/04/2021   10:20 AM 08/04/2021   10:19 AM  Diabetic Labs  HbA1c 4.0 - 5.6 %  9.8  9.9     Chol 100 - 199 mg/dL 161    179    HDL >39 mg/dL 56    59    Calc LDL 0 - 99 mg/dL 93    103    Triglycerides 0 - 149 mg/dL 62    93    Creatinine 0.57 - 1.00 mg/dL 1.01     1.10       03/17/2022   10:14 AM 03/09/2022   11:16 AM 11/12/2021    1:13 PM 10/29/2021   10:37 AM 10/29/2021   10:30 AM 08/12/2021   10:43 AM 06/11/2021   11:14 AM  BP/Weight  Systolic BP 749 355 217 471 595 396 728  Diastolic BP 73 79 73 66 81 74 74  Wt. (Lbs) 211.04 210.12 206.8  209 211 212.04  BMI 33.05 kg/m2 32.91 kg/m2 32.39 kg/m2  32.73 kg/m2 33.05 kg/m2 33.21 kg/m2      03/17/2022   10:00 AM 06/11/2021   11:20 AM  Foot/eye exam completion dates  Foot Form Completion Done Done      Managed by Endo, currently uncontrolled

## 2022-03-18 NOTE — Progress Notes (Signed)
Stacey Hanson     MRN: [id number]      DOB: 22-Jan-1946  HPI: Patient is in for annual physical exam. C/o swelling om left upper back, and in mouth increasing in size over past 6 month. Recent labs,  are reviewed. Immunization is reviewed , and  updated if needed.   PE: BP 125/73 (BP Location: Right Arm, Patient Position: Sitting, Cuff Size: Large)   Pulse (!) 106   Ht '5\' 7"'$  (1.702 m)   Wt 211 lb 0.6 oz (95.7 kg)   SpO2 96%   BMI 33.05 kg/m   Pleasant  female, alert and oriented x 3, in no cardio-pulmonary distress. Afebrile. HEENT No facial trauma or asymetry. Sinuses non tender.  Extra occullar muscles intact.. External ears normal, . Neck: supple, no adenopathy,JVD or thyromegaly.No bruits. Oral exam: excess plaque buildup on teeth. Lesion on floor of mouth left side approx max diameter 3 cm Chest: Clear to ascultation bilaterally.No crackles or wheezes. Non tender to palpation  Cardiovascular system; Heart sounds normal,  S1 and  S2 ,no S3.  No murmur, or thrill. Apical beat not displaced Peripheral pulses normal.  Abdomen: Soft, non tender, no organomegaly or masses. Bowel sounds normal. No guarding, tenderness or rebound.   Musculoskeletal exam: Decreased ROM of spine, hips , shoulders and knees.  deformity ,swelling and crepitus noted. No muscle wasting or atrophy.   Neurologic: Cranial nerves 2 to 12 intact. Power, tone , normal throughout.  disturbance in gait. No tremor.  Skin: Intact, no ulceration, erythema , scaling or rash noted.Large sebaceous cyst on left upper back, lower thoracic area, copious amount of sebum extruded with gentle pressure, no sign of infection in area Pigmentation normal throughout  Psych; Normal mood and affect. Judgement and concentration normal   Assessment & Plan:  Encounter for Medicare annual examination with abnormal findings Annual exam as documented. Counseling done  re healthy lifestyle involving commitment  to 150 minutes exercise per week, heart healthy diet, and attaining healthy weight.The importance of adequate sleep also discussed. Regular seat belt use and home safety, is also discussed. Changes in health habits are decided on by the patient with goals and time frames  set for achieving them. Immunization and cancer screening needs are specifically addressed at this visit.   Lesion of oral mucosa Slowly enlarging growth on lower left floor of mouth, x 9 months, not painful, oral surgery to eval and manage  Type 2 diabetes mellitus with vascular disease ([place]) Stacey Hanson is reminded of the importance of commitment to daily physical activity for 30 minutes or more, as able and the need to limit carbohydrate intake to 30 to 60 grams per meal to help with blood sugar control.   The need to take medication as prescribed, test blood sugar as directed, and to call between visits if there is a concern that blood sugar is uncontrolled is also discussed.   Stacey Hanson is reminded of the importance of daily foot exam, annual eye examination, and good blood sugar, blood pressure and cholesterol control.     Latest Ref Rng & Units 03/12/2022   10:53 AM 11/12/2021    1:25 PM 08/12/2021   10:54 AM 08/04/2021   10:20 AM 08/04/2021   10:19 AM  Diabetic Labs  HbA1c 4.0 - 5.6 %  9.8  9.9     Chol 100 - 199 mg/dL 161    179    HDL >39 mg/dL 56    59  Calc LDL 0 - 99 mg/dL 93    103    Triglycerides 0 - 149 mg/dL 62    93    Creatinine 0.57 - 1.00 mg/dL 1.01     1.10       03/17/2022   10:14 AM 03/09/2022   11:16 AM 11/12/2021    1:13 PM 10/29/2021   10:37 AM 10/29/2021   10:30 AM 08/12/2021   10:43 AM 06/11/2021   11:14 AM  BP/Weight  Systolic BP 239 532 023 343 568 616 837  Diastolic BP 73 79 73 66 81 74 74  Wt. (Lbs) 211.04 210.12 206.8  209 211 212.04  BMI 33.05 kg/m2 32.91 kg/m2 32.39 kg/m2  32.73 kg/m2 33.05 kg/m2 33.21 kg/m2      03/17/2022   10:00 AM 06/11/2021   11:20 AM  Foot/eye exam  completion dates  Foot Form Completion Done Done      Managed by Endo, currently uncontrolled  Sebaceous cyst Significan amount of sebum extruded from cyst on lower thoracic left , no traumatic injury to skin, no infection present

## 2022-03-18 NOTE — Progress Notes (Signed)
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    Knee pain    Obesity    Past Surgical History:  Procedure Laterality Date   ABDOMINAL HYSTERECTOMY  03/02/1968   CATARACT EXTRACTION W/PHACO Left 03/09/2019   Procedure: CATARACT EXTRACTION PHACO AND INTRAOCULAR LENS PLACEMENT ([place]);  Surgeon: Baruch Goldmann, MD;  Location: [facility];  Service: Ophthalmology;  Laterality: Left;  CDE: 9.08   CATARACT EXTRACTION W/PHACO Right 03/27/2019   Procedure: CATARACT EXTRACTION PHACO AND INTRAOCULAR LENS PLACEMENT RIGHT EYE;  Surgeon: Baruch Goldmann, MD;  Location: [facility];  Service: Ophthalmology;  Laterality: Right;  CDE: 7.47   CHOLECYSTECTOMY  OCT 2010 BZ BILIARY DYSKINESIA   CHRONIC CHOLECYSTITIS   COLONOSCOPY  MAY 2009 SCREENING   pTC TICS, [facility]   COLONOSCOPY N/A 08/11/2017   Procedure: COLONOSCOPY;  Surgeon: Danie Binder, MD;  Location: [facility];  Service: Endoscopy;  Laterality: N/A;  9:30am   ESOPHAGOGASTRODUODENOSCOPY  10/03/2010   Procedure: ESOPHAGOGASTRODUODENOSCOPY (EGD);  Surgeon: Dorothyann Peng, MD;  Location: [facility];  Service: Endoscopy;  Laterality: N/A;   ESOPHAGOGASTRODUODENOSCOPY  12/19/2002   RMR: Normal esophagus/Normal stomach/ Duodenum large bulbar diverticulum, 1 cm bulbar ulcer with surrounding/ inflammation as described above. Normal D2   FOOT SURGERY Left 01/29/2021   POLYPECTOMY  08/11/2017   Procedure: POLYPECTOMY;  Surgeon: Danie Binder, MD;  Location: [facility];  Service: Endoscopy;;  cecal     SAVORY DILATION  10/03/2010   Procedure: SAVORY DILATION;  Surgeon: Dorothyann Peng, MD;  Location: [facility];  Service: Endoscopy;;   TOTAL ABDOMINAL HYSTERECTOMY  03/02/1968   TOTAL KNEE ARTHROPLASTY Left 12/21/2017   Procedure: LEFT TOTAL KNEE ARTHROPLASTY;  Surgeon: Paralee Cancel, MD;  Location: [facility];  Service: Orthopedics;  Laterality: Left;  70 mins block   UPPER GASTROINTESTINAL ENDOSCOPY  OCT 2004 RMR   DUO TIC, & ULCER   Social History   Socioeconomic History   Marital status: Single    Spouse name: Not on file   Number of children: 3   Years of education: 10   Highest education level: 10th grade  Occupational History   Occupation: [employer]   Occupation: Retired   Tobacco Use   Smoking status: Former    Packs/day: 0.25    Years: 10.00    Total pack years: 2.50    Types: Cigarettes    Quit date: 02/20/1985    Years since quitting: 37.0   Smokeless tobacco: Never  Vaping Use   Vaping Use: Never used  Substance and Sexual Activity   Alcohol use: No    Comment: stopped drinking 1998   Drug use: No   Sexual activity: Not Currently  Other Topics Concern   Not on file  Social History Narrative   Lives alone and she retired from [employer] Strain: [place]  (03/09/2022)   Overall  Financial Resource Strain (CARDIA)    Difficulty of Paying Living Expenses: Not hard at all  Food Insecurity: No Food Insecurity (03/09/2022)   Hunger Vital Sign    Worried About Running Out of Food in the Last Year: Never true    Ran Out of Food in the Last Year: Never true  Transportation Needs: Unmet Transportation Needs (03/09/2022)   PRAPARE - Hydrologist (Medical): Yes    Lack of Transportation (Non-Medical): Yes  Physical Activity: Inactive (03/09/2022)   Exercise Vital Sign    Days of Exercise per Week: 0 days    Minutes of Exercise per Session: 0 min  Stress: No Stress Concern  Present (03/09/2022)   [place]    Feeling of Stress : Only a little  Social Connections: Unknown (03/09/2022)   Social Connection and Isolation Panel [NHANES]    Frequency of Communication with Friends and Family: More than three times a week    Frequency of Social Gatherings with Friends and Family: Once a week    Attends Religious Services: Never    Marine scientist or Organizations: No    Attends Archivist Meetings: Never    Marital Status: Patient refused   Outpatient Encounter Medications as of 03/18/2022  Medication Sig   alendronate (FOSAMAX) 41 MG tablet TAKE 1 TABLET BY MOUTH WEEKLY  WITH 8 OZ OF PLAIN WATER 30  MINUTES BEFORE FIRST FOOD, [place] OR MEDS. STAY UPRIGHT FOR 30  MINS   amLODipine-benazepril (LOTREL) 5-40 MG capsule TAKE 1 CAPSULE BY MOUTH DAILY   aspirin EC 81 MG tablet Take 81 mg by mouth daily.   Blood Glucose Monitoring Suppl (ONE TOUCH ULTRA 2) w/Device KIT Twice daily testing dx e11.65   Calcium Carb-Cholecalciferol (CALCIUM 600/VITAMIN D3 PO) Take 2 tablets by mouth daily.   hydrochlorothiazide (HYDRODIURIL) 25 MG tablet TAKE 1 TABLET BY MOUTH DAILY   insulin degludec (TRESIBA FLEXTOUCH) 100 UNIT/ML FlexTouch Pen Inject 60 Units into the skin at bedtime.   Insulin Pen Needle (B-D UF III MINI PEN NEEDLES) 31G X 5 MM MISC USE FOR TWICE DAILY INJECTIONS OF HUMALOG   Lancets (ONETOUCH DELICA PLUS XAJOIN86V) MISC USE TWICE DAILY TO CHECK BLOOD  SUGAR   levothyroxine (SYNTHROID) 88 MCG tablet TAKE 1 TABLET BY MOUTH DAILY  BEFORE BREAKFAST   Multiple Vitamin (MULTIVITAMIN) capsule Take 1 capsule by mouth daily.   omeprazole (PRILOSEC) 20 MG capsule TAKE 1 CAPSULE BY MOUTH IN THE  MORNING   ONETOUCH ULTRA test strip USE TO CHECK BLOOD SUGAR TWICE  DAILY   polyethylene glycol (MIRALAX / GLYCOLAX) 17 g packet Take 17 g by mouth as needed.   psyllium (METAMUCIL) 58.6 % packet Take 1 packet by  mouth as needed.   rosuvastatin (CRESTOR) 5 MG tablet TAKE 1 TABLET BY MOUTH 3 TIMES  WEEKLY EVERY MONDAY WEDNESDAY  AND FRIDAY   Semaglutide, 1 MG/DOSE, 4 MG/3ML SOPN Inject 1 mg as directed once a week.   UNABLE TO FIND Diabetic shoes x 1 pair, Inserts x 3 pair DX E11.9   [DISCONTINUED] Semaglutide,0.25 or 0.'5MG'$ /DOS, (OZEMPIC, 0.25 OR 0.5 MG/DOSE,) 2 MG/1.5ML SOPN Inject 0.5 mg into the skin once a week. Inject 0.25 mg SQ weekly x 2 weeks, then advance to 0.5 mg SQ weekly thereafter if tolerated well   [DISCONTINUED] benazepril (LOTENSIN) 40 MG tablet    [DISCONTINUED] docusate sodium (COLACE) 100 MG capsule    [DISCONTINUED]  MODERNA COVID-19 BIVALENT 50 MCG/0.5ML injection    [DISCONTINUED] PREVNAR 20 0.5 ML injection    No facility-administered encounter medications on file as of 03/18/2022.   ALLERGIES: Allergies  Allergen Reactions   Pravastatin Other (See Comments)    PT is currently taking but states she cramps   Simvastatin Other (See Comments)    Muscle cramps Other reaction(s): Not available   VACCINATION STATUS: Immunization History  Administered Date(s) Administered   Fluad Quad(high Dose 65+) 11/23/2018, 12/21/2019, 12/10/2020, 11/12/2021   Influenza,inj,Quad PF,6+ Mos 11/16/2012, 11/15/2013, 11/14/2014, 11/14/2015, 11/03/2016, 11/09/2017   Influenza,inj,quad, With Preservative 11/02/2016   Moderna SARS-COV2 Booster Vaccination 07/24/2020, 05/26/2021   Moderna Sars-Covid-2 Vaccination 04/27/2019, 05/26/2019, 01/08/2020   Pneumococcal Conjugate-13 10/11/2013   Pneumococcal Polysaccharide-23 10/07/2011   Tdap 12/16/2020   Zoster Recombinat (Shingrix) 12/16/2020   Zoster, Live 04/05/2006   Zoster, Unspecified 06/16/2021    Hypertension This is a chronic problem. The current episode started more than 1 year ago. The problem has been resolved since onset. The problem is controlled. Pertinent negatives include no blurred vision, headaches, palpitations, shortness of  breath or sweats. Agents associated with hypertension include thyroid hormones. Risk factors for coronary artery disease include diabetes mellitus, dyslipidemia, obesity, sedentary lifestyle and smoking/tobacco exposure. Past treatments include ACE inhibitors, diuretics and calcium channel blockers. The current treatment provides mild improvement. There are no compliance problems.  Hypertensive end-organ damage includes kidney disease. Identifiable causes of hypertension include chronic renal disease and a thyroid problem.  Diabetes She presents for her follow-up diabetic visit. She has type 2 diabetes mellitus. Onset time: She was diagnosed at approximate age of 50 years. Her disease course has been worsening. Pertinent negatives for hypoglycemia include no confusion, headaches, nervousness/anxiousness, sweats or tremors. Pertinent negatives for diabetes include no blurred vision, no fatigue, no foot paresthesias, no polydipsia, no polyphagia, no polyuria and no weight loss. There are no hypoglycemic complications. Symptoms are stable. Diabetic complications include nephropathy. Risk factors for coronary artery disease include dyslipidemia, diabetes mellitus, obesity, hypertension, sedentary lifestyle, post-menopausal and tobacco exposure. Current diabetic treatment includes insulin injections (and Ozempic). She is compliant with treatment most of the time. Her weight is fluctuating minimally. She is following a generally unhealthy diet. When asked about meal planning, she reported none. She has not had a previous visit with a dietitian. She rarely participates in exercise. Her home blood glucose trend is increasing steadily. Her breakfast blood glucose range is generally 140-180 mg/dl. Her dinner blood glucose range is generally >200 mg/dl. (She presents today with her meter and logs showing above target glycemic profile.  Her POCT A1c today is 10.9%, worsening from last visit of 9.8%.  She admits to over  consumption of processed foods during the holidays.  She denies any hypoglycemia.  Analysis of her meter shows 7-day average of 248, 14-day average of 236, 30-day average of 255.) An ACE inhibitor/angiotensin II receptor blocker is being taken. She does not see a podiatrist.Eye exam is current.  Thyroid Problem Presents for follow-up (-She is also status post radioactive iodine treatment for toxic nodule on October 17, 2010.  She has had  history of multinodular goiter status post fine-needle aspiration with benign findings.) visit. Patient reports no anxiety, cold intolerance, constipation, depressed mood, diarrhea, fatigue, heat intolerance, leg swelling, palpitations, tremors, weight gain or weight loss. The symptoms have been stable.     Review of systems  Constitutional: + stable body weight,  current Body mass index is 33.11 kg/m. , no fatigue, no  subjective hyperthermia, no subjective hypothermia Eyes: no blurry vision, no xerophthalmia ENT: no sore throat, no nodules palpated in throat, no dysphagia/odynophagia, no hoarseness Cardiovascular: no chest pain, no shortness of breath, no palpitations, no leg swelling Respiratory: no cough, no shortness of breath Gastrointestinal: no nausea/vomiting/diarrhea Musculoskeletal: bilateral knee pain and leg aches (has talked with PCP about this) Skin: no rashes, no hyperemia Neurological: no tremors, no numbness, no tingling, no dizziness Psychiatric: no depression, no anxiety    Objective:    BP 116/75 (BP Location: Left Arm, Patient Position: Sitting, Cuff Size: Normal)   Pulse (!) 109   Ht '5\' 7"'$  (1.702 m)   Wt 211 lb 6.4 oz (95.9 kg)   BMI 33.11 kg/m   Wt Readings from Last 3 Encounters:  03/18/22 211 lb 6.4 oz (95.9 kg)  03/17/22 211 lb 0.6 oz (95.7 kg)  03/09/22 210 lb 1.9 oz (95.3 kg)    BP Readings from Last 3 Encounters:  03/18/22 116/75  03/17/22 125/73  03/09/22 137/79    Physical Exam- Limited  Constitutional:  Body  mass index is 33.11 kg/m. , not in acute distress, normal state of mind, ? Memory deficits Eyes:  EOMI, no exophthalmos Musculoskeletal: no gross deformities, strength intact in all four extremities, no gross restriction of joint movements Skin:  no rashes, no hyperemia Neurological: no tremor with outstretched hands    CMP     Component Value Date/Time   NA 138 03/12/2022 1053   K 4.4 03/12/2022 1053   CL 98 03/12/2022 1053   CO2 25 03/12/2022 1053   GLUCOSE 210 (H) 03/12/2022 1053   GLUCOSE 395 (H) 12/28/2020 2201   BUN 26 03/12/2022 1053   CREATININE 1.01 (H) 03/12/2022 1053   CREATININE 0.96 (H) 03/12/2016 0925   CALCIUM 9.5 03/12/2022 1053   PROT 7.9 03/12/2022 1053   ALBUMIN 4.7 03/12/2022 1053   AST 22 03/12/2022 1053   ALT 18 03/12/2022 1053   ALKPHOS 105 03/12/2022 1053   BILITOT 0.2 03/12/2022 1053   GFRNONAA 40 (L) 12/28/2020 2201   GFRNONAA 60 03/12/2016 0925   GFRAA 48 (L) 04/11/2020 1007   GFRAA 69 03/12/2016 0925     Diabetic Labs (most recent): Lab Results  Component Value Date   HGBA1C 10.9 (A) 03/18/2022   HGBA1C 9.8 (A) 11/12/2021   HGBA1C 9.9 08/12/2021   MICROALBUR 10 04/19/2020   MICROALBUR 0.4 03/12/2016   MICROALBUR 0.2 02/19/2014     Lipid Panel ( most recent) Lipid Panel     Component Value Date/Time   CHOL 161 03/12/2022 1053   TRIG 62 03/12/2022 1053   HDL 56 03/12/2022 1053   CHOLHDL 2.9 03/12/2022 1053   CHOLHDL 2.2 01/11/2016 0916   VLDL 11 01/11/2016 0916   LDLCALC 93 03/12/2022 1053    Assessment & Plan:   1) Uncontrolled type 2 diabetes mellitus with stage 3 chronic kidney disease, with long-term current use of insulin ([place])  She presents today with her meter and logs showing above target glycemic profile.  Her POCT A1c today is 10.9%, worsening from last visit of 9.8%.  She admits to over consumption of processed foods during the holidays.  She denies any hypoglycemia.  Analysis of her meter shows 7-day average of 248,  14-day average of 236, 30-day average of 255.  - Nutritional counseling repeated at each appointment due to patients tendency to fall back in to old habits.  - The patient admits there is a room for improvement in their  diet and drink choices. -  Suggestion is made for the patient to avoid simple carbohydrates from their diet including Cakes, Sweet Desserts / Pastries, Ice Cream, Soda (diet and regular), Sweet Tea, Candies, Chips, Cookies, Sweet Pastries, Store Bought Juices, Alcohol in Excess of 1-2 drinks a day, Artificial Sweeteners, Coffee Creamer, and "Sugar-free" Products. This will help patient to have stable blood glucose profile and potentially avoid unintended weight gain.   - I encouraged the patient to switch to unprocessed or minimally processed complex starch and increased protein intake (animal or plant source), fruits, and vegetables.   - Patient is advised to stick to a routine mealtimes to eat 3 meals a day and avoid unnecessary snacks (to snack only to correct hypoglycemia).  -She is advised to continue her Tresiba 60 units SQ nightly and increase her Ozempic to 1 mg SQ weekly (she can take 2 injections of her 0.5 mg dose on same day until she depletes her current supply).   She notes her stomach has felt much better since the discontinuation of Metformin and wishes to stay off of it.   -She is advised to continue monitoring blood glucose twice daily, before breakfast and before bed, and to call the clinic if she has readings less than 70 or above 200 for 3 tests in a row.  She does not want a CGM, has tried it and does not like it.  2) Hypothyroidism- s/p RAI -There are no recent TFTs to review.  She is advised to continue Levothyroxine 88 mcg po daily before breakfast.    - We discussed about the correct intake of her thyroid hormone, on empty stomach at fasting, with water, separated by at least 30 minutes from breakfast and other medications,  and separated by more than 4  hours from calcium, iron, multivitamins, acid reflux medications (PPIs). -Patient is made aware of the fact that thyroid hormone replacement is needed for life, dose to be adjusted by periodic monitoring of thyroid function tests.  3) Weight management:  Her Body mass index is 33.11 kg/m.-a candidate for modest weight loss.  Patient specific carbs restrictions and exercise regimen discussed with her.  4) Blood pressure/ Hypertension: -Her blood pressure is controlled to target.  She is advised to continue Amlodipine Benazepril 5-40 mg po daily and HCTZ 25 mg po daily.    5) Hyperlipidemia: Her recent lipid panel from 03/12/22 showed controlled LDL of 93.  She is advised to continue Pravastatin 40 mg po daily at bedtime.  Side effects and precautions discussed with her.  - I advised patient to maintain close follow up with Fayrene Helper, MD for diabetes and all other primary care needs.      I spent 41 minutes in the care of the patient today including review of labs from [place], Lipids, Thyroid Function, Hematology (current and previous including abstractions from other facilities); face-to-face time discussing  her blood glucose readings/logs, discussing hypoglycemia and hyperglycemia episodes and symptoms, medications doses, her options of short and long term treatment based on the latest standards of care / guidelines;  discussion about incorporating lifestyle medicine;  and documenting the encounter. Risk reduction counseling performed per USPSTF guidelines to reduce obesity and cardiovascular risk factors.     Please refer to Patient Instructions for Blood Glucose Monitoring and Insulin/Medications Dosing Guide"  in media tab for additional information. Please  also refer to " Patient Self Inventory" in the Media  tab for reviewed elements of pertinent patient history.  Mechele Claude  [place] participated in the discussions, expressed understanding, and voiced agreement with the above plans.   All questions were answered to her satisfaction. she is encouraged to contact clinic should she have any questions or concerns prior to her return visit.    Follow up plan: Return in about 3 months (around 06/17/2022) for Diabetes F/U with A1c in office, Bring meter and logs.   Rayetta Pigg, [facility] [address] [place], [place] [address] Phone: [phone] Fax: [phone]  03/18/2022, 3:50 PM

## 2022-03-19 LAB — MICROALBUMIN / CREATININE URINE RATIO
Creatinine, Urine: 99.9 mg/dL
Microalb/Creat Ratio: 6 mg/g creat (ref 0–29)
Microalbumin, Urine: 6.3 ug/mL

## 2022-03-31 ENCOUNTER — Ambulatory Visit: Payer: Self-pay | Admitting: *Deleted

## 2022-03-31 NOTE — Patient Outreach (Signed)
  Care Coordination   03/31/2022 Name: Stacey Hanson MRN: [id number] DOB: 10-20-1945   Care Coordination Outreach Attempts:  An unsuccessful telephone outreach was attempted for a scheduled appointment today.  Follow Up Plan:  Additional outreach attempts will be made to offer the patient care coordination information and services.   Encounter Outcome:  No Answer   Care Coordination Interventions:  No, not indicated    Chong Sicilian, BSN, RN-BC RN Care Coordinator [place]: [phone] Main #: [phone]

## 2022-04-15 ENCOUNTER — Ambulatory Visit: Payer: Self-pay | Admitting: *Deleted

## 2022-04-15 NOTE — Patient Instructions (Addendum)
Visit Information  Thank you for taking time to visit with me today. Please don't hesitate to contact me if I can be of assistance to you.   Following are the goals we discussed today:   Goals Addressed               This Visit's Progress     Patient Stated     COMPLETED: Decrease side effects related possibly from medicine causing myalgia [facility]) (pt-stated)   On track     Care Coordination Interventions: Re activated Goal 11/13/21. Modified to address Myalgias possibly related to medication side effects Confirmed patient continues to take Crestor with less muscle cramps but she reports an elevated HgA1c as a side effect  Reviewed her recent lipid lab values which indicates all values are within normal limit  Goal complete      Keep blood sugar down ([facility]) (pt-stated)   Not on track      Care Coordination Interventions: Discussed plans with patient for ongoing care management follow up and provided patient with direct contact information for care management team Discussed elevated HgA1c from 9.8 to 10.9 and returning to diabetic diet to manage  Confirmed some non compliance during the past end of the year holidays Confirmed her Ozempic was increased but she is having side effects at the 1 mg dose. Confirmed she continues to take Ozempic but returned to the tolerable 0.5 mg dose Confirmed a recent endocrinology visit completed with new treatment plan      COMPLETED: Manage constipation ([facility]) (pt-stated)   On track     Care Coordination Interventions: Evaluation of current treatment plan related to constipation and patient's adherence to plan as established by provider,  Confirmed she is doing better and not having constipation concerns today Complete goal      Medical Transportation([facility]) (pt-stated)   Not on track     Care Coordination Interventions: Evaluation of current treatment plan related to medical transportation to [facility] 04/29/22 0915 and patient's adherence to plan as  established by provider Discussed plans with patient for ongoing care management follow up and provided patient with direct contact information for care management team Assessed social determinant of health barriers Conference her to [employer] [phone] to confirm she does not have a transportation benefit on her   Conference calls with her to [facility] [phone] + to [facility] unsuccessfully to establish her as a new rider Reviewed [facility] fares as $2-3 each way Provided patient with the [facility] number after a message was left in the new rider voice mail box      obtain a new shower chair with back [facility]) (pt-stated)   Not on track     Care Coordination Interventions: Assessed for delivery of durable medical equipment (DME) She has not ordered yet waiting for next OTC quarter        Our next appointment is by telephone on 05/14/22 at 2 pm  Please call the care guide team at [phone] if you need to cancel or reschedule your appointment.   If you are experiencing a Mental Health or [place] or need someone to talk to, please call the Suicide and Crisis Lifeline: 988 call the [place] National Suicide Prevention Lifeline: (773) 174-4700 or TTY: 917-363-0929 TTY (470) 626-2499) to talk to a trained counselor call 1-800-273-TALK (toll free, 24 hour hotline) call the [facility]: 571-802-4324 call 911   The patient verbalized understanding of instructions, educational materials, and care plan provided today  and DECLINED offer to receive copy of patient instructions, educational materials, and care plan.   The patient has been provided with contact information for the care management team and has been advised to call with any health related questions or concerns.    Miraya Cudney L. Lavina Hamman, RN, BSN, [place] Coordinator Office number [phone]

## 2022-04-15 NOTE — Patient Outreach (Signed)
Care Coordination   Follow Up Visit Note   04/15/2022 Name: Stacey Hanson MRN: [id number] DOB: [birth year]  Stacey Hanson is a 77 y.o. year old female who sees Fayrene Helper, MD for primary care. I spoke with  Stacey Hanson by phone today.  What matters to the patients health and wellness today?  Has been out running errands  Muscle cramps Hyperlipidemia Crestor is causing increase cbg but cholesterol  Diabetes- Unable to tolerate 2 daily doses of Ozempic had side of weakness, blurred vision, "dizzy in my head" presyncope, loss balance "felt like I was leaving here"  Now taking 0.5 every Friday States she will do better after the holidays   Goals Addressed               This Visit's Progress     Patient Stated     COMPLETED: Decrease side effects related possibly from medicine causing myalgia [facility]) (pt-stated)   On track     Care Coordination Interventions: Re activated Goal 11/13/21. Modified to address Myalgias possibly related to medication side effects Confirmed patient continues to take Crestor with less muscle cramps but she reports an elevated HgA1c as a side effect  Reviewed her recent lipid lab values which indicates all values are within normal limit  Goal complete      Keep blood sugar down (THN) (pt-stated)   Not on track      Care Coordination Interventions: Discussed plans with patient for ongoing care management follow up and provided patient with direct contact information for care management team Discussed elevated HgA1c from 9.8 to 10.9 and returning to diabetic diet to manage  Confirmed some non compliance during the past end of the year holidays Confirmed her Ozempic was increased but she is having side effects at the 1 mg dose. Confirmed she continues to take Ozempic but returned to the tolerable 0.5 mg dose Confirmed a recent endocrinology visit completed with new treatment plan      COMPLETED: Manage constipation (THN) (pt-stated)   On track      Care Coordination Interventions: Evaluation of current treatment plan related to constipation and patient's adherence to plan as established by provider,  Confirmed she is doing better and not having constipation concerns today Complete goal      Medical Transportation(THN) (pt-stated)   Not on track     Care Coordination Interventions: Evaluation of current treatment plan related to medical transportation to [facility] 04/29/22 0915 and patient's adherence to plan as established by provider Discussed plans with patient for ongoing care management follow up and provided patient with direct contact information for care management team Assessed social determinant of health barriers Conference her to [employer] [phone] to confirm she does not have a transportation benefit on her   Conference calls with her to [facility] [phone] + to [employer] unsuccessfully to establish her as a new rider Reviewed [facility] fares as $2-3 each way Provided patient with the [facility] number after a message was left in the new rider voice mail box      obtain a new shower chair with back ([place]) (pt-stated)   Not on track     Care Coordination Interventions: Assessed for delivery of durable medical equipment (DME) She has not ordered yet waiting for next OTC quarter       Interventions Today    Flowsheet Row Most Recent Value  Chronic Disease   Chronic disease during today's visit Other, Diabetes, Hypertension (HTN)  General Interventions  General Interventions Discussed/Reviewed General Interventions Discussed, General Interventions Reviewed, Labs, Lipid Profile, [employer], Doctor Visits  Labs Hgb A1c every 6 months  Doctor Visits Discussed/Reviewed Doctor Visits Reviewed, PCP, Specialist       SDOH assessments and interventions completed:  Yes  SDOH Interventions Today    Flowsheet Row Most Recent Value  SDOH Interventions   Food Insecurity Interventions Intervention Not Indicated  Housing  Interventions Intervention Not Indicated  Transportation Interventions Intervention Not Indicated  Utilities Interventions Intervention Not Indicated  Depression Interventions/Treatment  Patient refuses Treatment  Financial Strain Interventions Intervention Not Indicated  Stress Interventions Intervention Not Indicated        Care Coordination Interventions:  Yes, provided   Follow up plan: Follow up call scheduled for 05/14/22    Encounter Outcome:  Pt. Visit Completed  [place] L. Lavina Hamman, RN, BSN, [place] Coordinator Office number [phone]

## 2022-04-27 DIAGNOSIS — E119 Type 2 diabetes mellitus without complications: Secondary | ICD-10-CM | POA: Diagnosis not present

## 2022-04-27 LAB — HM DIABETES EYE EXAM

## 2022-05-06 DIAGNOSIS — E1151 Type 2 diabetes mellitus with diabetic peripheral angiopathy without gangrene: Secondary | ICD-10-CM | POA: Diagnosis not present

## 2022-05-06 DIAGNOSIS — M79674 Pain in right toe(s): Secondary | ICD-10-CM | POA: Diagnosis not present

## 2022-05-06 DIAGNOSIS — M79675 Pain in left toe(s): Secondary | ICD-10-CM | POA: Diagnosis not present

## 2022-05-06 DIAGNOSIS — M79671 Pain in right foot: Secondary | ICD-10-CM | POA: Diagnosis not present

## 2022-05-06 DIAGNOSIS — M79672 Pain in left foot: Secondary | ICD-10-CM | POA: Diagnosis not present

## 2022-05-14 ENCOUNTER — Ambulatory Visit: Payer: Self-pay | Admitting: *Deleted

## 2022-05-14 DIAGNOSIS — H26493 Other secondary cataract, bilateral: Secondary | ICD-10-CM | POA: Diagnosis not present

## 2022-05-14 NOTE — Patient Outreach (Signed)
Care Coordination   Follow Up Visit Note   05/14/2022 Name: Stacey Hanson MRN: [id number] DOB: 11/08/1945  Stacey Hanson is a 77 y.o. year old female who sees Fayrene Helper, MD for primary care. I spoke with  [name] by phone today.  What matters to the patients health and wellness today?  Ophthalmology Jorja Loa) visit today- not having to get laser procedure as her eye sight has approved    Diabetes- She does not want to take the Ozempic 2 doses weekly at 0.5 mg  Patient states she is only able to tolerate the ).5 only once on Fridays  Worsening symptoms "felt funny like I was going to pass out. I lost my balance. My feet and legs felt like they were going to fall out from under me. Just got weak all over. Feel ike I was going to shut down."   Confirms she has in county transportation now to  No transportation out of county if she does not have medicaid. She does not have medicaid. The last out of county medical visit was during her knee surgery.    Goals Addressed               This Visit's Progress     Patient Stated     Keep blood sugar down (THN) (pt-stated)   On track      Care Coordination Interventions: Discussed plans with patient for ongoing care management follow up and provided patient with direct contact information for care management team  Interventions Today    Flowsheet Row Most Recent Value  Chronic Disease   Chronic disease during today's visit Diabetes, Other  [eye appointment]  General Interventions   General Interventions Discussed/Reviewed Communication with  Labs Hgb A1c every 3 months  Doctor Visits Discussed/Reviewed Doctor Visits Discussed, PCP, Specialist  PCP/Specialist Visits Compliance with follow-up visit  Communication with PCP/Specialists  [sent EPIC clinical pool message to [facility] endocrinology associates inquiring if patient can take Ozempic 0.5 twice a week so she can tolerate/decrease her side effects]  Exercise  Interventions   Exercise Discussed/Reviewed Physical Activity  Physical Activity Discussed/Reviewed Physical Activity Reviewed  Education Interventions   Education Provided --  [out of town transportation, [employer] use]  Provided Orthoptist On --  [out of town transportation]  [place] Discussed/Reviewed Coping Strategies, [place] depression]  Nutrition Interventions   Nutrition Discussed/Reviewed Nutrition Reviewed, Decreasing sugar intake, Portion sizes  Pharmacy Interventions   Pharmacy Dicussed/Reviewed Pharmacy Topics Reviewed, Medication Adherence  Medication Adherence Not taking medication  [ozempic 1 vs 0.5]  Safety Interventions   Safety Discussed/Reviewed Safety Discussed, Fall Risk  [encouraged keeping her cane close]          Medical Transportation(THN) (pt-stated)   On track     Care Coordination Interventions: Evaluation of current treatment plan related to medical transportation to [facility] 04/29/22 0915 and patient's adherence to plan as established by provider Discussed plans with patient for ongoing care management follow up and provided patient with direct contact information for care management team Assessed social determinant of health barriers Confirmed she has started using [facility] transportation with fare of $3 each way  Conference calls with her to [facility] [phone]  to inquire if she was eligible and a cost for traveling out of - she is not as she does not have medicaid Agrees to work transportation by choosing an in [employer] provider, get son, Mikeal Hawthorne to help or  request [facility] assist, and during enrollment time to add transportation to her united healthcare AARP plan. Interventions Today    Flowsheet Row Most Recent Value  Chronic Disease   Chronic disease during today's visit Diabetes, Other  [eye appointment]  General Interventions   General Interventions Discussed/Reviewed Communication with  Labs  Hgb A1c every 3 months  Doctor Visits Discussed/Reviewed Doctor Visits Discussed, PCP, Specialist  PCP/Specialist Visits Compliance with follow-up visit  Communication with PCP/Specialists  [sent EPIC clinical pool message to [facility] endocrinology associates inquiring if patient can take Ozempic 0.5 twice a week so she can tolerate/decrease her side effects]  Exercise Interventions   Exercise Discussed/Reviewed Physical Activity  Physical Activity Discussed/Reviewed Physical Activity Reviewed  Education Interventions   Education Provided --  [out of town transportation, [employer] use]  Provided Orthoptist On --  [out of town transportation]  [place] Discussed/Reviewed Coping Strategies, Mental Health Reviewed  [denies depression]  Nutrition Interventions   Nutrition Discussed/Reviewed Nutrition Reviewed, Decreasing sugar intake, Portion sizes  Pharmacy Interventions   Pharmacy Dicussed/Reviewed Pharmacy Topics Reviewed, Medication Adherence  Medication Adherence Not taking medication  [ozempic 1 vs 0.5]  Safety Interventions   Safety Discussed/Reviewed Safety Discussed, Fall Risk  [encouraged keeping her cane close]            obtain a new shower chair with back (THN) (pt-stated)   Not on track     Care Coordination Interventions: Assessed for delivery of durable medical equipment (DME) Still has not ordered yet waiting for next OTC quarter (she uses her old one "I'm not ready to order a new one yet"        SDOH assessments and interventions completed:  Yes  SDOH Interventions Today    Flowsheet Row Most Recent Value  SDOH Interventions   Food Insecurity Interventions Intervention Not Indicated  Utilities Interventions Intervention Not Indicated  Financial Strain Interventions Intervention Not Indicated  Stress Interventions Intervention Not Indicated  Social Connections Interventions Intervention Not Indicated        Care  Coordination Interventions:  No, not indicated   Follow up plan: Follow up call scheduled for 06/18/22 2 pm    Encounter Outcome:  Pt. Visit Completed   Derr[place] Gaby L. Lavina Hamman, RN, BSN, [place] Coordinator Office number [phone]

## 2022-05-14 NOTE — Patient Instructions (Signed)
Visit Information  Thank you for taking time to visit with me today. Please don't hesitate to contact me if I can be of assistance to you.   Following are the goals we discussed today:   Goals Addressed               This Visit's Progress     Patient Stated     Keep blood sugar down (THN) (pt-stated)   On track      Care Coordination Interventions: Discussed plans with patient for ongoing care management follow up and provided patient with direct contact information for care management team  Interventions Today    Flowsheet Row Most Recent Value  Chronic Disease   Chronic disease during today's visit Diabetes, Other  [eye appointment]  General Interventions   General Interventions Discussed/Reviewed Communication with  Labs Hgb A1c every 3 months  Doctor Visits Discussed/Reviewed Doctor Visits Discussed, PCP, Specialist  PCP/Specialist Visits Compliance with follow-up visit  Communication with PCP/Specialists  [sent EPIC clinical pool message to [facility] endocrinology associates inquiring if patient can take Ozempic 0.5 twice a week so she can tolerate/decrease her side effects]  Exercise Interventions   Exercise Discussed/Reviewed Physical Activity  Physical Activity Discussed/Reviewed Physical Activity Reviewed  Education Interventions   Education Provided --  [out of town transportation, [employer] use]  Provided Orthoptist On --  [out of town transportation]  [place] Discussed/Reviewed Coping Strategies, [place] depression]  Nutrition Interventions   Nutrition Discussed/Reviewed Nutrition Reviewed, Decreasing sugar intake, Portion sizes  Pharmacy Interventions   Pharmacy Dicussed/Reviewed Pharmacy Topics Reviewed, Medication Adherence  Medication Adherence Not taking medication  [ozempic 1 vs 0.5]  Safety Interventions   Safety Discussed/Reviewed Safety Discussed, Fall Risk  [encouraged keeping her cane  close]          Medical Transportation(THN) (pt-stated)   On track     Care Coordination Interventions: Evaluation of current treatment plan related to medical transportation to [facility] 04/29/22 0915 and patient's adherence to plan as established by provider Discussed plans with patient for ongoing care management follow up and provided patient with direct contact information for care management team Assessed social determinant of health barriers Confirmed she has started using [facility] transportation with fare of $3 each way  Conference calls with her to [facility] [phone]  to inquire if she was eligible and a cost for traveling out of - she is not as she does not have medicaid Agrees to work transportation by choosing an in [employer] provider, get son, Mikeal Hawthorne to help or request [facility] assist, and during enrollment time to add transportation to her united healthcare AARP plan. Interventions Today    Flowsheet Row Most Recent Value  Chronic Disease   Chronic disease during today's visit Diabetes, Other  [eye appointment]  General Interventions   General Interventions Discussed/Reviewed Communication with  Labs Hgb A1c every 3 months  Doctor Visits Discussed/Reviewed Doctor Visits Discussed, PCP, Specialist  PCP/Specialist Visits Compliance with follow-up visit  Communication with PCP/Specialists  [sent EPIC clinical pool message to [facility] endocrinology associates inquiring if patient can take Ozempic 0.5 twice a week so she can tolerate/decrease her side effects]  Exercise Interventions   Exercise Discussed/Reviewed Physical Activity  Physical Activity Discussed/Reviewed Physical Activity Reviewed  Education Interventions   Education Provided --  [out of town transportation, ozempic use]  Provided Orthoptist On --  [out of town transportation]  Mental Health Interventions   Mental  Health Discussed/Reviewed Coping Strategies, Mental Health Reviewed  [denies depression]  Nutrition  Interventions   Nutrition Discussed/Reviewed Nutrition Reviewed, Decreasing sugar intake, Portion sizes  Pharmacy Interventions   Pharmacy Dicussed/Reviewed Pharmacy Topics Reviewed, Medication Adherence  Medication Adherence Not taking medication  [ozempic 1 vs 0.5]  Safety Interventions   Safety Discussed/Reviewed Safety Discussed, Fall Risk  [encouraged keeping her cane close]            obtain a new shower chair with back (THN) (pt-stated)   Not on track     Care Coordination Interventions: Assessed for delivery of durable medical equipment (DME) Still has not ordered yet waiting for next OTC quarter (she uses her old one "I'm not ready to order a new one yet"        Our next appointment is by telephone on 06/18/22 at 2 pm  Please call the care guide team at [phone] if you need to cancel or reschedule your appointment.   If you are experiencing a Mental Health or [place] or need someone to talk to, please call the Suicide and Crisis Lifeline: 988 call the [place] National Suicide Prevention Lifeline: (858)456-3254 or TTY: 5175207210 TTY 717-722-1359) to talk to a trained counselor call 1-800-273-TALK (toll free, 24 hour hotline) call the [facility]: [phone] call 911   The patient verbalized understanding of instructions, educational materials, and care plan provided today and DECLINED offer to receive copy of patient instructions, educational materials, and care plan.   The patient has been provided with contact information for the care management team and has been advised to call with any health related questions or concerns.   Female Minish L. Lavina Hamman, RN, BSN, [place] Coordinator Office number [phone]

## 2022-05-28 ENCOUNTER — Telehealth: Payer: Self-pay | Admitting: Family Medicine

## 2022-05-28 NOTE — Telephone Encounter (Signed)
Pt called stating that she is wanting to know if the pwk for DM shoes been sent to [place]?

## 2022-06-10 ENCOUNTER — Other Ambulatory Visit: Payer: Self-pay | Admitting: Family Medicine

## 2022-06-10 DIAGNOSIS — E1165 Type 2 diabetes mellitus with hyperglycemia: Secondary | ICD-10-CM

## 2022-06-17 ENCOUNTER — Encounter: Payer: Self-pay | Admitting: Nurse Practitioner

## 2022-06-17 ENCOUNTER — Ambulatory Visit: Payer: Medicare Other | Admitting: Nurse Practitioner

## 2022-06-17 VITALS — BP 130/68 | HR 97 | Ht 67.0 in | Wt 204.2 lb

## 2022-06-17 DIAGNOSIS — E890 Postprocedural hypothyroidism: Secondary | ICD-10-CM

## 2022-06-17 DIAGNOSIS — E782 Mixed hyperlipidemia: Secondary | ICD-10-CM | POA: Diagnosis not present

## 2022-06-17 DIAGNOSIS — I10 Essential (primary) hypertension: Secondary | ICD-10-CM | POA: Diagnosis not present

## 2022-06-17 DIAGNOSIS — E1165 Type 2 diabetes mellitus with hyperglycemia: Secondary | ICD-10-CM

## 2022-06-17 LAB — POCT GLYCOSYLATED HEMOGLOBIN (HGB A1C): Hemoglobin A1C: 10.5 % — AB (ref 4.0–5.6)

## 2022-06-17 MED ORDER — GLIPIZIDE ER 2.5 MG PO TB24: 2.5000 mg | ORAL_TABLET | Freq: Every day | ORAL | 1 refills | Status: DC

## 2022-06-17 NOTE — Addendum Note (Signed)
Addended by: Shona Needles on: 06/17/2022 11:30 AM   Modules accepted: Orders

## 2022-06-17 NOTE — Progress Notes (Signed)
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    Knee pain    Obesity    Past Surgical History:  Procedure Laterality Date   ABDOMINAL HYSTERECTOMY  03/02/1968   CATARACT EXTRACTION W/PHACO Left 03/09/2019   Procedure: CATARACT EXTRACTION PHACO AND INTRAOCULAR LENS PLACEMENT (IOC);  Surgeon: Fabio Pierce, MD;  Location: [facility];  Service: Ophthalmology;  Laterality: Left;  CDE: 9.08   CATARACT EXTRACTION W/PHACO Right 03/27/2019   Procedure: CATARACT EXTRACTION PHACO AND INTRAOCULAR LENS PLACEMENT RIGHT EYE;  Surgeon: Fabio Pierce, MD;  Location: [facility];  Service: Ophthalmology;  Laterality: Right;  CDE: 7.47   CHOLECYSTECTOMY  OCT 2010 BZ BILIARY DYSKINESIA   CHRONIC CHOLECYSTITIS   COLONOSCOPY  MAY 2009 SCREENING   [facility]TC TICS, [facility]   COLONOSCOPY N/A 08/11/2017   Procedure: COLONOSCOPY;  Surgeon: West Bali, MD;  Location: [facility];  Service: Endoscopy;  Laterality: N/A;  9:30am   ESOPHAGOGASTRODUODENOSCOPY  10/03/2010   Procedure: ESOPHAGOGASTRODUODENOSCOPY (EGD);  Surgeon: Arlyce Harman, MD;  [facility]: [facility];  Service: Endoscopy;  Laterality: N/A;   ESOPHAGOGASTRODUODENOSCOPY  12/19/2002   RMR: Normal esophagus/Normal stomach/ Duodenum large bulbar diverticulum, 1 cm bulbar ulcer with surrounding/ inflammation as described above. Normal D2   FOOT SURGERY Left 01/29/2021   POLYPECTOMY  08/11/2017   Procedure: POLYPECTOMY;  Surgeon: West Bali, MD;  Location: [facility];  Service: Endoscopy;;  cecal     SAVORY DILATION  10/03/2010   Procedure: SAVORY DILATION;  Surgeon: Arlyce Harman, MD;  Location: [facility];  Service: Endoscopy;;   TOTAL ABDOMINAL HYSTERECTOMY  03/02/1968   TOTAL KNEE ARTHROPLASTY Left 12/21/2017   Procedure: LEFT TOTAL KNEE ARTHROPLASTY;  Surgeon: Durene Romans, MD;  Location: [facility];  Service: Orthopedics;  Laterality: Left;  70 mins block   UPPER GASTROINTESTINAL ENDOSCOPY  OCT 2004 RMR   DUO TIC, & ULCER   Social History   Socioeconomic History   Marital status: Single    Spouse name: Not on file   Number of children: 3   Years of education: 10   Highest education level: 10th grade  Occupational History   Occupation: [employer]   Occupation: Retired   Tobacco Use   Smoking status: Former    Packs/day: 0.25    Years: 10.00    Additional pack years: 0.00    Total pack years: 2.50    Types: Cigarettes    Quit date: 02/20/1985    Years since quitting: 37.3   Smokeless tobacco: Never  Vaping Use   Vaping Use: Never used  Substance and Sexual Activity   Alcohol use: No    Comment: stopped drinking 1998   Drug use: No   Sexual activity: Not Currently  Other Topics Concern   Not on file  Social History Narrative   Lives alone and 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   Social Determinants of Health   Financial Resource Strain:  Low Risk  (05/14/2022)   Overall Financial Resource Strain (CARDIA)    Difficulty of Paying Living Expenses: Not hard at all  Food Insecurity: No Food Insecurity (05/14/2022)   Hunger Vital Sign    Worried About Running Out of Food in the Last Year: Never true    Ran Out of Food in the Last Year: Never true  Transportation Needs: Unmet Transportation Needs (04/15/2022)   PRAPARE - Transportation    Lack of Transportation (Medical): Yes    Lack of Transportation (Non-Medical): Yes  Physical Activity: Inactive (03/09/2022)   Exercise Vital Sign    Days of Exercise per Week: 0 days    Minutes of Exercise per Session: 0 min   Stress: No Stress Concern Present (05/14/2022)   [employer] of Occupational Health - Occupational Stress Questionnaire    Feeling of Stress : Not at all  Social Connections: Moderately Isolated (05/14/2022)   Social Connection and Isolation Panel [NHANES]    Frequency of Communication with Friends and Family: Three times a week    Frequency of Social Gatherings with Friends and Family: Once a week    Attends Religious Services: Never    Database administrator or Organizations: Yes    Attends Banker Meetings: 1 to 4 times per year    Marital Status: Never married   Outpatient Encounter Medications as of 06/17/2022  Medication Sig   alendronate (FOSAMAX) 70 MG tablet TAKE 1 TABLET BY MOUTH WEEKLY  WITH 8 OZ OF PLAIN WATER 30  MINUTES BEFORE FIRST FOOD, DRINK OR MEDS. STAY UPRIGHT FOR 30  MINS   amLODipine-benazepril (LOTREL) 5-40 MG capsule TAKE 1 CAPSULE BY MOUTH DAILY   aspirin EC 81 MG tablet Take 81 mg by mouth daily.   Blood Glucose Monitoring Suppl (ONE TOUCH ULTRA 2) w/Device KIT Twice daily testing dx e11.65   Calcium Carb-Cholecalciferol (CALCIUM 600/VITAMIN D3 PO) Take 2 tablets by mouth daily.   glipiZIDE (GLUCOTROL XL) 2.5 MG 24 hr tablet Take 1 tablet (2.5 mg total) by mouth daily with breakfast.   hydrochlorothiazide (HYDRODIURIL) 25 MG tablet TAKE 1 TABLET BY MOUTH DAILY   insulin degludec (TRESIBA FLEXTOUCH) 100 UNIT/ML FlexTouch Pen Inject 60 Units into the skin at bedtime.   Insulin Pen Needle (B-D UF III MINI PEN NEEDLES) 31G X 5 MM MISC USE FOR TWICE DAILY INJECTIONS OF HUMALOG   Lancets (ONETOUCH DELICA PLUS LANCET33G) MISC USE TWICE DAILY TO CHECK BLOOD  SUGAR   levothyroxine (SYNTHROID) 88 MCG tablet TAKE 1 TABLET BY MOUTH DAILY  BEFORE BREAKFAST   Multiple Vitamin (MULTIVITAMIN) capsule Take 1 capsule by mouth daily.   omeprazole (PRILOSEC) 20 MG capsule TAKE 1 CAPSULE BY MOUTH IN THE  MORNING   ONETOUCH ULTRA test strip USE TO CHECK BLOOD SUGAR  TWICE  DAILY   polyethylene glycol (MIRALAX / GLYCOLAX) 17 g packet Take 17 g by mouth as needed.   psyllium (METAMUCIL) 58.6 % packet Take 1 packet by mouth as needed.   rosuvastatin (CRESTOR) 5 MG tablet TAKE 1 TABLET BY MOUTH 3 TIMES  WEEKLY EVERY MONDAY WEDNESDAY  AND FRIDAY   Semaglutide, 1 MG/DOSE, 4 MG/3ML SOPN Inject 1 mg as directed once a week.   UNABLE TO FIND Diabetic shoes x 1 pair, Inserts x 3 pair DX E11.9   No facility-administered encounter medications on file as of 06/17/2022.   ALLERGIES: Allergies  Allergen Reactions   Pravastatin Other (See Comments)    PT is currently taking but  states she cramps   Simvastatin Other (See Comments)    Muscle cramps Other reaction(s): Not available   VACCINATION STATUS: Immunization History  Administered Date(s) Administered   Fluad Quad(high Dose 65+) 11/23/2018, 12/21/2019, 12/10/2020, 11/12/2021   Influenza,inj,Quad PF,6+ Mos 11/16/2012, 11/15/2013, 11/14/2014, 11/14/2015, 11/03/2016, 11/09/2017   Influenza,inj,quad, With Preservative 11/02/2016   Moderna SARS-COV2 Booster Vaccination 07/24/2020, 05/26/2021   Moderna Sars-Covid-2 Vaccination 04/27/2019, 05/26/2019, 01/08/2020   Pneumococcal Conjugate-13 10/11/2013   Pneumococcal Polysaccharide-23 10/07/2011   Tdap 12/16/2020   Zoster Recombinat (Shingrix) 12/16/2020   Zoster, Live 04/05/2006   Zoster, Unspecified 06/16/2021    Hypertension This is a chronic problem. The current episode started more than 1 year ago. The problem has been resolved since onset. The problem is controlled. Pertinent negatives include no blurred vision, headaches, palpitations, shortness of breath or sweats. Agents associated with hypertension include thyroid hormones. Risk factors for coronary artery disease include diabetes mellitus, dyslipidemia, obesity, sedentary lifestyle and smoking/tobacco exposure. Past treatments include ACE inhibitors, diuretics and calcium channel blockers. The current  treatment provides mild improvement. There are no compliance problems.  Hypertensive end-organ damage includes kidney disease. Identifiable causes of hypertension include chronic renal disease and a thyroid problem.  Diabetes She presents for her follow-up diabetic visit. She has type 2 diabetes mellitus. Onset time: She was diagnosed at approximate age of 40 years. Her disease course has been stable. Pertinent negatives for hypoglycemia include no confusion, headaches, nervousness/anxiousness, sweats or tremors. Pertinent negatives for diabetes include no blurred vision, no fatigue, no foot paresthesias, no polydipsia, no polyphagia, no polyuria and no weight loss. There are no hypoglycemic complications. Symptoms are stable. Diabetic complications include nephropathy. Risk factors for coronary artery disease include dyslipidemia, diabetes mellitus, obesity, hypertension, sedentary lifestyle, post-menopausal and tobacco exposure. Current diabetic treatment includes insulin injections (and Ozempic). She is compliant with treatment most of the time. Her weight is fluctuating minimally. She is following a generally unhealthy diet. When asked about meal planning, she reported none. She has not had a previous visit with a dietitian. She rarely participates in exercise. Her home blood glucose trend is fluctuating minimally. Her breakfast blood glucose range is generally 140-180 mg/dl. Her dinner blood glucose range is generally >200 mg/dl. (She presents today with her meter and logs showing above target glycemic profile.  Her POCT A1c today is 10.5%, improving only slightly from last visit of 10.9%.  She denies any hypoglycemia.  She was unable to tolerate the increase in her Ozempic.  Analysis of her meter shows 7-day average of 246, 14-day average of 222, 30-day average of 247.) An ACE inhibitor/angiotensin II receptor blocker is being taken. She does not see a podiatrist.Eye exam is current.  Thyroid  Problem Presents for follow-up (-She is also status post radioactive iodine treatment for toxic nodule on October 17, 2010.  She has had  history of multinodular goiter status post fine-needle aspiration with benign findings.) visit. Patient reports no anxiety, cold intolerance, constipation, depressed mood, diarrhea, fatigue, heat intolerance, leg swelling, palpitations, tremors, weight gain or weight loss. The symptoms have been stable.     Review of systems  Constitutional: + steadily decreasing body weight,  current Body mass index is 31.98 kg/m. , no fatigue, no subjective hyperthermia, no subjective hypothermia Eyes: no blurry vision, no xerophthalmia ENT: no sore throat, no nodules palpated in throat, no dysphagia/odynophagia, no hoarseness Cardiovascular: no chest pain, no shortness of breath, no palpitations, no leg swelling Respiratory: no cough, no shortness of breath Gastrointestinal: no  nausea/vomiting/diarrhea Musculoskeletal: no muscle/joint aches Skin: no rashes, no hyperemia Neurological: no tremors, no numbness, no tingling, no dizziness Psychiatric: no depression, no anxiety    Objective:    BP 130/68 (BP Location: Left Arm, Patient Position: Sitting, Cuff Size: Large)   Pulse 97   Ht 5\' 7"  (1.702 m)   Wt 204 lb 3.2 oz (92.6 kg)   BMI 31.98 kg/m   Wt Readings from Last 3 Encounters:  06/17/22 204 lb 3.2 oz (92.6 kg)  03/18/22 211 lb 6.4 oz (95.9 kg)  03/17/22 211 lb 0.6 oz (95.7 kg)    BP Readings from Last 3 Encounters:  06/17/22 130/68  03/18/22 116/75  03/17/22 125/73    Physical Exam- Limited  Constitutional:  Body mass index is 31.98 kg/m. , not in acute distress, normal state of mind, ? Memory deficits Eyes:  EOMI, no exophthalmos Musculoskeletal: no gross deformities, strength intact in all four extremities, no gross restriction of joint movements Skin:  no rashes, no hyperemia Neurological: no tremor with outstretched hands    CMP      Component Value Date/Time   NA 138 03/12/2022 1053   K 4.4 03/12/2022 1053   CL 98 03/12/2022 1053   CO2 25 03/12/2022 1053   GLUCOSE 210 (H) 03/12/2022 1053   GLUCOSE 395 (H) 12/28/2020 2201   BUN 26 03/12/2022 1053   CREATININE 1.01 (H) 03/12/2022 1053   CREATININE 0.96 (H) 03/12/2016 0925   CALCIUM 9.5 03/12/2022 1053   PROT 7.9 03/12/2022 1053   ALBUMIN 4.7 03/12/2022 1053   AST 22 03/12/2022 1053   ALT 18 03/12/2022 1053   ALKPHOS 105 03/12/2022 1053   BILITOT 0.2 03/12/2022 1053   GFRNONAA 40 (L) 12/28/2020 2201   GFRNONAA 60 03/12/2016 0925   GFRAA 48 (L) 04/11/2020 1007   GFRAA 69 03/12/2016 0925     Diabetic Labs (most recent): Lab Results  Component Value Date   HGBA1C 10.5 (A) 06/17/2022   HGBA1C 10.9 (A) 03/18/2022   HGBA1C 9.8 (A) 11/12/2021   MICROALBUR 10 04/19/2020   MICROALBUR 0.4 03/12/2016   MICROALBUR 0.2 02/19/2014     Lipid Panel ( most recent) Lipid Panel     Component Value Date/Time   CHOL 161 03/12/2022 1053   TRIG 62 03/12/2022 1053   HDL 56 03/12/2022 1053   CHOLHDL 2.9 03/12/2022 1053   CHOLHDL 2.2 01/11/2016 0916   VLDL 11 01/11/2016 0916   LDLCALC 93 03/12/2022 1053    Assessment & Plan:   1) Uncontrolled type 2 diabetes mellitus with stage 3a chronic kidney disease, with long-term current use of insulin (HCC)  She presents today with her meter and logs showing above target glycemic profile.  Her POCT A1c today is 10.5%, improving only slightly from last visit of 10.9%.  She denies any hypoglycemia.  She was unable to tolerate the increase in her Ozempic.  Analysis of her meter shows 7-day average of 246, 14-day average of 222, 30-day average of 247.  - Nutritional counseling repeated at each appointment due to patients tendency to fall back in to old habits.  - The patient admits there is a room for improvement in their diet and drink choices. -  Suggestion is made for the patient to avoid simple carbohydrates from their diet  including Cakes, Sweet Desserts / Pastries, Ice Cream, Soda (diet and regular), Sweet Tea, Candies, Chips, Cookies, Sweet Pastries, Store Bought Juices, Alcohol in Excess of 1-2 drinks a day, Artificial Sweeteners, Coffee Creamer, and "  Sugar-free" Products. This will help patient to have stable blood glucose profile and potentially avoid unintended weight gain.   - I encouraged the patient to switch to unprocessed or minimally processed complex starch and increased protein intake (animal or plant source), fruits, and vegetables.   - Patient is advised to stick to a routine mealtimes to eat 3 meals a day and avoid unnecessary snacks (to snack only to correct hypoglycemia).  -She is advised to continue her Tresiba 60 units SQ nightly and Ozempic 0.5 mg SQ weekly (could not tolerate higher doses).  I did discuss and initiate trial of low dose Glipizide 2.5 mg XL daily with breakfast to help drive postprandial highs down.    She notes her stomach has felt much better since the discontinuation of Metformin and wishes to stay off of it.   -She is advised to continue monitoring blood glucose twice daily, before breakfast and before bed, and to call the clinic if she has readings less than 70 or above 200 for 3 tests in a row.  She does not want a CGM, has tried it and does not like it.  2) Hypothyroidism- s/p RAI -There are no recent TFTs to review.  She is advised to continue Levothyroxine 88 mcg po daily before breakfast.  Will recheck TFTs prior to next visit.   - We discussed about the correct intake of her thyroid hormone, on empty stomach at fasting, with water, separated by at least 30 minutes from breakfast and other medications,  and separated by more than 4 hours from calcium, iron, multivitamins, acid reflux medications (PPIs). -Patient is made aware of the fact that thyroid hormone replacement is needed for life, dose to be adjusted by periodic monitoring of thyroid function tests.  3) Weight  management:  Her Body mass index is 31.98 kg/m.-a candidate for modest weight loss.  Patient specific carbs restrictions and exercise regimen discussed with her.  4) Blood pressure/ Hypertension: -Her blood pressure is controlled to target.  She is advised to continue Amlodipine Benazepril 5-40 mg po daily and HCTZ 25 mg po daily.    5) Hyperlipidemia: Her recent lipid panel from 03/12/22 showed controlled LDL of 93.  She is advised to continue Pravastatin 40 mg po daily at bedtime.  Side effects and precautions discussed with her.  - I advised patient to maintain close follow up with Kerri Perches, MD for diabetes and all other primary care needs.      I spent  43  minutes in the care of the patient today including review of labs from CMP, Lipids, Thyroid Function, Hematology (current and previous including abstractions from other facilities); face-to-face time discussing  her blood glucose readings/logs, discussing hypoglycemia and hyperglycemia episodes and symptoms, medications doses, her options of short and long term treatment based on the latest standards of care / guidelines;  discussion about incorporating lifestyle medicine;  and documenting the encounter. Risk reduction counseling performed per USPSTF guidelines to reduce obesity and cardiovascular risk factors.     Please refer to Patient Instructions for Blood Glucose Monitoring and Insulin/Medications Dosing Guide"  in media tab for additional information. Please  also refer to " Patient Self Inventory" in the Media  tab for reviewed elements of pertinent patient history.  Stacey Hanson participated in the discussions, expressed understanding, and voiced agreement with the above plans.  All questions were answered to her satisfaction. she is encouraged to contact clinic should she have any questions or concerns prior to her  return visit.    Follow up plan: Return in about 3 months (around 09/16/2022) for Diabetes F/U with  A1c in office, Thyroid follow up, Previsit labs, Bring meter and logs.   Ronny Bacon, [facility] [address] [place], [place] [address] Phone: [phone] Fax: [phone]  06/17/2022, 11:17 AM

## 2022-06-18 ENCOUNTER — Ambulatory Visit: Payer: Self-pay | Admitting: *Deleted

## 2022-06-18 NOTE — Patient Instructions (Addendum)
Visit Information  Thank you for taking time to visit with me today. Please don't hesitate to contact me if I can be of assistance to you.   Following are the goals we discussed today:   Goals Addressed               This Visit's Progress     Patient Stated     COMPLETED: Medical Transportation([facility]) (pt-stated)   On track     Care Coordination Interventions: Goal completed Using local medical transportation      obtain a new shower chair with back ([facility]) (pt-stated)   Not on track     Care Coordination Interventions: Assessed for delivery of durable medical equipment (DME) Still has not ordered yet waiting for next OTC quarter (she uses her old one "I'm not ready to order a new one yet"      [facility] care coordination services (Revised) (pt-stated)   Not on track      Care Coordination Interventions: Interventions Today    Flowsheet Row Most Recent Value  Chronic Disease   Chronic disease during today's visit Diabetes, Other  [confirms she agrees to try another medicine (po) other than Ozempic. Confirms she plans to go to the senior center or the [facility]]  General Interventions   General Interventions Discussed/Reviewed General Interventions Reviewed  Labs Hgb A1c every 3 months  Doctor Visits Discussed/Reviewed Specialist, Doctor Visits Reviewed  Exercise Interventions   Exercise Discussed/Reviewed Exercise Reviewed, Physical Activity, Weight Managment  Physical Activity Discussed/Reviewed Physical Activity Reviewed, Types of exercise  [Encouraged patient as she reports she will start attending the local senior center or Y for more physical activity]  Weight Management Weight loss  Education Interventions   Education Provided Provided Education  [better choice of food, portion control, food label reading]  Provided Verbal Education On Nutrition, Labs, Medication, Community Resources, Blood Sugar Monitoring  [10.5 HgA1c, reviewed her preferred snack list to provide alternative low  sugar, low calorie options, Senior center/Y, discussed that her MD will assist with a by mouth diabetes medicine as she is not abe to tolerate Ozempic]  Labs Reviewed Hgb A1c  Nutrition Interventions   Nutrition Discussed/Reviewed Nutrition Reviewed, Adding fruits and vegetables, Decreasing sugar intake, Decreasing salt  [Discussed her snacks and better choices for her snacks like more fruits and vegetable and low calorie popcorn Limited peanuts. Strongly encouraged her to read food labels Less than 10 grams sugar]  Pharmacy Interventions   Pharmacy Dicussed/Reviewed Pharmacy Topics Reviewed, Medications and their functions  [ozempic]            Our next appointment is by telephone on 07/17/22 at 2 pm  Please call the care guide team at [phone] if you need to cancel or reschedule your appointment.   If you are experiencing a Mental Health or Behavioral Health Crisis or need someone to talk to, please call the Suicide and Crisis Lifeline: 988 call the [place] National Suicide Prevention Lifeline: 250-766-4689 or TTY: 740-272-7771 TTY 939-693-8444) to talk to a trained counselor call 1-800-273-TALK (toll free, 24 hour hotline) call the [facility]: [phone] call 911   The patient verbalized understanding of instructions, educational materials, and care plan provided today and DECLINED offer to receive copy of patient instructions, educational materials, and care plan.   The patient has been provided with contact information for the care management team and has been advised to call with any health related questions or concerns.   Verlie Liotta L. Noelle Penner, RN, BSN, CCM  [place] Coordinator Office number [phone]

## 2022-06-18 NOTE — Patient Outreach (Signed)
  Care Coordination   Follow Up Visit Note   06/18/2022 Name: Cipriana Biller MRN: [id number] DOB: 11/20/1945  Stacey Hanson is a 77 y.o. year old female who sees Kerri Perches, MD for primary care. I spoke with  [name] by phone today.  What matters to the patients health and wellness today?  Endocrinology visit 06/17/22, She feels her barrier to her uncontrolled diabetes is snacking Recent 06/17/22 HgbA1c= 10.5 was 10.9  Has already seen the dietitian and does not prefer to see another one She reports she likes to snack on peanuts, potato chips, pop corn, soft gala red apples, banana & peaches Other options Skinnypop [place] redenbacker mini bags, fruit and vegetables    Goals Addressed               This Visit's Progress     Patient Stated     COMPLETED: Medical Transportation([facility]) (pt-stated)   On track     Care Coordination Interventions: Goal completed Using local medical transportation      obtain a new shower chair with back ([facility]) (pt-stated)   Not on track     Care Coordination Interventions: Assessed for delivery of durable medical equipment (DME) Still has not ordered yet waiting for next OTC quarter (she uses her old one "I'm not ready to order a new one yet"      [facility] care coordination services (Revised) (pt-stated)   Not on track      Care Coordination Interventions: Interventions Today    Flowsheet Row Most Recent Value  Chronic Disease   Chronic disease during today's visit Diabetes, Other  [confirms she agrees to try another medicine (po) other than Ozempic. Confirms she plans to go to the senior center or the Y]  General Interventions   General Interventions Discussed/Reviewed General Interventions Reviewed  Labs Hgb A1c every 3 months  Doctor Visits Discussed/Reviewed Specialist, Doctor Visits Reviewed  Exercise Interventions   Exercise Discussed/Reviewed Exercise Reviewed, Physical Activity, Weight Managment  Physical Activity  Discussed/Reviewed Physical Activity Reviewed, Types of exercise  [Encouraged patient as she reports she will start attending the local senior center or Y for more physical activity]  Weight Management Weight loss  Education Interventions   Education Provided Provided Education  [better choice of food, portion control, food label reading]  Provided Verbal Education On Nutrition, Labs, Medication, Community Resources, Blood Sugar Monitoring  [10.5 HgA1c, reviewed her preferred snack list to provide alternative low sugar, low calorie options, Senior center/Y, discussed that her MD will assist with a by mouth diabetes medicine as she is not abe to tolerate Ozempic]  Labs Reviewed Hgb A1c  Nutrition Interventions   Nutrition Discussed/Reviewed Nutrition Reviewed, Adding fruits and vegetables, Decreasing sugar intake, Decreasing salt  [Discussed her snacks and better choices for her snacks like more fruits and vegetable and low calorie popcorn Limited peanuts. Strongly encouraged her to read food labels Less than 10 grams sugar]  Pharmacy Interventions   Pharmacy Dicussed/Reviewed Pharmacy Topics Reviewed, Medications and their functions  [ozempic]            SDOH assessments and interventions completed:  No     Care Coordination Interventions:  Yes, provided   Follow up plan: Follow up call scheduled for 07/17/22    Encounter Outcome:  Pt. Visit Completed   Jajaira Ruis L. Noelle Penner, RN, BSN, CCM [facility] Care Management Community Coordinator Office number [phone]

## 2022-07-12 ENCOUNTER — Other Ambulatory Visit: Payer: Self-pay | Admitting: Family Medicine

## 2022-07-15 ENCOUNTER — Other Ambulatory Visit: Payer: Self-pay | Admitting: Family Medicine

## 2022-07-15 DIAGNOSIS — M79674 Pain in right toe(s): Secondary | ICD-10-CM | POA: Diagnosis not present

## 2022-07-15 DIAGNOSIS — E1151 Type 2 diabetes mellitus with diabetic peripheral angiopathy without gangrene: Secondary | ICD-10-CM | POA: Diagnosis not present

## 2022-07-15 DIAGNOSIS — M79675 Pain in left toe(s): Secondary | ICD-10-CM | POA: Diagnosis not present

## 2022-07-15 DIAGNOSIS — M79671 Pain in right foot: Secondary | ICD-10-CM | POA: Diagnosis not present

## 2022-07-15 DIAGNOSIS — M79672 Pain in left foot: Secondary | ICD-10-CM | POA: Diagnosis not present

## 2022-07-17 ENCOUNTER — Ambulatory Visit: Payer: Self-pay | Admitting: *Deleted

## 2022-07-17 NOTE — Patient Instructions (Addendum)
Visit Information  Thank you for taking time to visit with me today. Please don't hesitate to contact me if I can be of assistance to you.   Following are the goals we discussed today:   Goals Addressed               This Visit's Progress     Patient Stated     Home management of diabetic shoes, weight loss/ozempic, Diabetes, Hypothyroidism, preventive care- RN CM services (pt-stated)        Interventions Today    Flowsheet Row Most Recent Value  Chronic Disease   Chronic disease during today's visit Other  [podiatry MD visit, Ozempic use]  General Interventions   General Interventions Discussed/Reviewed General Interventions Reviewed, Communication with, Annual Foot Exam, Community Resources, Doctor Visits  Doctor Visits Discussed/Reviewed Doctor Visits Reviewed, PCP, Specialist  Durable Medical Equipment (DME) Other  [diabetic shoes]              Our next appointment is by telephone on 08/17/22 at 2:45 pm  Please call the care guide team at [phone] if you need to cancel or reschedule your appointment.   If you are experiencing a Mental Health or Behavioral Health Crisis or need someone to talk to, please    The patient verbalized understanding of instructions, educational materials, and care plan provided today and DECLINED offer to receive copy of patient instructions, educational materials, and care plan.   The patient has been provided with contact information for the care management team and has been advised to call with any health related questions or concerns.   Tysen Roesler L. Noelle Penner, RN, BSN, CCM [facility] Care Management Community Coordinator Office number [phone]

## 2022-07-17 NOTE — Patient Outreach (Signed)
  Care Coordination   Follow Up Visit Note   03/24/2023 updated note for 07/17/22 Name: Jerlyn Luzadder MRN: [id number] DOB: 10/20/1945  Eudell Holzknecht is a 77 y.o. year old female who sees Kerri Perches, MD for primary care. I spoke with  [name] by phone today.  What matters to the patients health and wellness today?  Recent podiatry visit, Had toe nails clipped Reports she does have pain and burning in her feet   Continues to tolerate her reduced Ozempic dose once a week on Friday She states she has been doing better on her snacking. She is now reading her food labels Last endocrinology visit on 06/17/22 She had lost 7 lbs  She is now taking Lipton green tea and loves them  Spoke with MD office and informed she can get assist with diabetic shoes once a year after a foot exam per Tammy then she will get fit for shoes    Goals Addressed               This Visit's Progress     Patient Stated     Home management of diabetic shoes, weight loss/ozempic, Diabetes, Hypothyroidism, preventive care- RN CM services (pt-stated)        Interventions Today    Flowsheet Row Most Recent Value  Chronic Disease   Chronic disease during today's visit Other  [podiatry MD visit, Ozempic use]  General Interventions   General Interventions Discussed/Reviewed General Interventions Reviewed, Communication with, Annual Foot Exam, Community Resources, Doctor Visits  Doctor Visits Discussed/Reviewed Doctor Visits Reviewed, PCP, Specialist  Durable Medical Equipment (DME) Other  [diabetic shoes]              SDOH assessments and interventions completed:  No     Care Coordination Interventions:  Yes, provided   Follow up plan: Follow up call scheduled for 08/17/22    Encounter Outcome:  Pt. Visit Completed   Deklyn Gibbon L. Noelle Penner, RN, BSN, CCM [facility] Care Management Community Coordinator Office number [phone]

## 2022-08-14 ENCOUNTER — Ambulatory Visit: Payer: Medicare Other | Admitting: Family Medicine

## 2022-08-17 ENCOUNTER — Ambulatory Visit: Payer: Self-pay | Admitting: *Deleted

## 2022-08-17 NOTE — Patient Outreach (Addendum)
  Care Coordination   Follow Up Visit Note   08/17/2022 Name: Stacey Hanson MRN: [id number] DOB: [birth year]  Stacey Hanson is a 77 y.o. year old female who sees Stacey Perches, MD for primary care. I spoke with  Stacey Hanson by phone today.  What matters to the patients health and wellness today?  Glipizide "causing me to have bowel movements" "weak" No longer constipated 2-3 times a day States she also believes she had a stomach virus that started about 8-9 days Confirms she is staying hydrated   Diabetes Noted cbgs <220 140-180 since she has started Glipizide She believes she has lost some weight but had not weighed    Preventive care Sees her podiatrist Stacey Hanson in [place] in July 2024 Dr Stacey Hanson checked her eyes in February 2024  Continues to take her Crestor on Mondays, Wednesday, Friday But has noted some cramping at night of her thighs She reports this is not often   She voiced concern about not having follow up related her diabetic shoes  Hypothyroidism reports concerns with not sleeping, getting up frequently at night    Goals Addressed               This Visit's Progress     Patient Stated     COMPLETED: obtain a new shower chair with back ([facility]) (pt-stated)        Care Coordination Interventions: Assessed for delivery of durable medical equipment (DME) Still pending patient ordering via her OTC quarter benefit       [facility] care coordination services (Diabetes, Hypothyroidism, diabetic shoes, preventive care, medicines) (pt-stated)         Care Coordination Interventions: Interventions Today    Flowsheet Row Most Recent Value  Chronic Disease   Chronic disease during today's visit Diabetes, Hypertension (HTN), Other  [hypothyroidism]  General Interventions   General Interventions Discussed/Reviewed General Interventions Reviewed, Doctor Visits, Durable Medical Equipment (DME), Annual Eye Exam, Communication with  Doctor Visits Discussed/Reviewed  Doctor Visits Reviewed, PCP, Specialist  Durable Medical Equipment (DME) Other  [diabetic shoes]  PCP/Specialist Visits Compliance with follow-up visit  Communication with RN  Exercise Interventions   Exercise Discussed/Reviewed Exercise Reviewed, Physical Activity, Weight Managment  Physical Activity Discussed/Reviewed Physical Activity Reviewed, Types of exercise, Home Exercise Program (HEP)  Weight Management Weight maintenance  Education Interventions   Education Provided Provided Education  [importance of preventive care for eyes, feet, glipizide & side effects, when to call for diarrhea]  Provided Verbal Education On Foot Care, Eye Care, Labs, Blood Sugar Monitoring, Medication, When to see the doctor, Nutrition  Mental Health Interventions   Mental Health Discussed/Reviewed Mental Health Reviewed, Coping Strategies  Pharmacy Interventions   Pharmacy Dicussed/Reviewed Pharmacy Topics Reviewed, Medications and their functions, Affording Medications  Safety Interventions   Safety Discussed/Reviewed Safety Reviewed, Home Safety  Home Safety Assistive Devices            SDOH assessments and interventions completed:  No     Care Coordination Interventions:  Yes, provided   Follow up plan: Follow up call scheduled for 09/29/22    Encounter Outcome:  Pt. Visit Completed   Tabathia Knoche L. Noelle Penner, RN, BSN, CCM [facility] Care Management Community Coordinator Office number [phone]

## 2022-08-17 NOTE — Patient Instructions (Addendum)
Visit Information  Thank you for taking time to visit with me today. Please don't hesitate to contact me if I can be of assistance to you.   Following are the goals we discussed today:   Goals Addressed               This Visit's Progress     Patient Stated     COMPLETED: obtain a new shower chair with back (THN) (pt-stated)        Care Coordination Interventions: Assessed for delivery of durable medical equipment (DME) Still pending patient ordering via her OTC quarter benefit       [facility] care coordination services (Diabetes, Hypothyroidism, diabetic shoes, preventive care, medicines) (pt-stated)         Care Coordination Interventions: Interventions Today    Flowsheet Row Most Recent Value  Chronic Disease   Chronic disease during today's visit Diabetes, Hypertension (HTN), Other  [hypothyroidism]  General Interventions   General Interventions Discussed/Reviewed General Interventions Reviewed, Doctor Visits, Durable Medical Equipment (DME), Annual Eye Exam, Communication with  Doctor Visits Discussed/Reviewed Doctor Visits Reviewed, PCP, Specialist  Durable Medical Equipment (DME) Other  [diabetic shoes]  PCP/Specialist Visits Compliance with follow-up visit  Communication with PCP/Specialists, RN  Nicholes Stairs to [employer], [facility] [employer] The only [employer] DME company to assist=[employer]. An order for diabetic shoes with/facesheet needs to be faxed to [phone] Then a Diabetic verifciation form will be sent to pcp]  Exercise Interventions   Exercise Discussed/Reviewed Exercise Reviewed, Physical Activity, Weight Managment  Physical Activity Discussed/Reviewed Physical Activity Reviewed, Types of exercise, Home Exercise Program (HEP)  Weight Management Weight maintenance  Education Interventions   Education Provided Provided Education  [importance of preventive care for eyes, feet, glipizide & side effects, when to call for diarrhea]   Provided Verbal Education On Foot Care, Eye Care, Labs, Blood Sugar Monitoring, Medication, When to see the doctor, Nutrition  Mental Health Interventions   Mental Health Discussed/Reviewed Mental Health Reviewed, Coping Strategies  Pharmacy Interventions   Pharmacy Dicussed/Reviewed Pharmacy Topics Reviewed, Medications and their functions, Affording Medications  Safety Interventions   Safety Discussed/Reviewed Safety Reviewed, Home Safety  Home Safety --  [diabetic shoes]            Our next appointment is by telephone on 09/29/22 at 2:45 pm  Please call the care guide team at [phone] if you need to cancel or reschedule your appointment.   If you are experiencing a Mental Health or Behavioral Health Crisis or need someone to talk to, please call the Suicide and Crisis Lifeline: 988 call the [place] National Suicide Prevention Lifeline: (319)002-1972 or TTY: 519-157-4856 TTY 279-190-2867) to talk to a trained counselor call 1-800-273-TALK (toll free, 24 hour hotline) call the [facility]: 857-703-1694   The patient verbalized understanding of instructions, educational materials, and care plan provided today and DECLINED offer to receive copy of patient instructions, educational materials, and care plan.   The patient has been provided with contact information for the care management team and has been advised to call with any health related questions or concerns.    Stacey Hanson L. Noelle Penner, RN, BSN, CCM [facility] Care Management Community Coordinator Office number [phone]

## 2022-08-25 ENCOUNTER — Other Ambulatory Visit: Payer: Self-pay | Admitting: Nurse Practitioner

## 2022-08-26 ENCOUNTER — Other Ambulatory Visit: Payer: Self-pay | Admitting: Family Medicine

## 2022-09-13 ENCOUNTER — Other Ambulatory Visit: Payer: Self-pay | Admitting: Family Medicine

## 2022-09-15 ENCOUNTER — Ambulatory Visit (INDEPENDENT_AMBULATORY_CARE_PROVIDER_SITE_OTHER): Payer: Medicare Other | Admitting: Family Medicine

## 2022-09-15 ENCOUNTER — Encounter: Payer: Self-pay | Admitting: Family Medicine

## 2022-09-15 VITALS — BP 120/74 | HR 108 | Ht 67.0 in | Wt 210.0 lb

## 2022-09-15 DIAGNOSIS — I10 Essential (primary) hypertension: Secondary | ICD-10-CM

## 2022-09-15 DIAGNOSIS — Z1231 Encounter for screening mammogram for malignant neoplasm of breast: Secondary | ICD-10-CM | POA: Diagnosis not present

## 2022-09-15 DIAGNOSIS — E1159 Type 2 diabetes mellitus with other circulatory complications: Secondary | ICD-10-CM

## 2022-09-15 DIAGNOSIS — E6609 Other obesity due to excess calories: Secondary | ICD-10-CM

## 2022-09-15 DIAGNOSIS — E782 Mixed hyperlipidemia: Secondary | ICD-10-CM | POA: Diagnosis not present

## 2022-09-15 DIAGNOSIS — E890 Postprocedural hypothyroidism: Secondary | ICD-10-CM

## 2022-09-15 DIAGNOSIS — Z1211 Encounter for screening for malignant neoplasm of colon: Secondary | ICD-10-CM | POA: Diagnosis not present

## 2022-09-15 DIAGNOSIS — Z6831 Body mass index (BMI) 31.0-31.9, adult: Secondary | ICD-10-CM

## 2022-09-15 NOTE — Assessment & Plan Note (Signed)
Controlled, no change in medication DASH diet and commitment to daily physical activity for a minimum of 30 minutes discussed and encouraged, as a part of hypertension management. The importance of attaining a healthy weight is also discussed.     09/15/2022   10:06 AM 06/17/2022   10:55 AM 03/18/2022    2:39 PM 03/17/2022   10:14 AM 03/09/2022   11:16 AM 11/12/2021    1:13 PM 10/29/2021   10:37 AM  BP/Weight  Systolic BP 120 130 116 125 137 108 122  Diastolic BP 74 68 75 73 79 73 66  Wt. (Lbs) 210 204.2 211.4 211.04 210.12 206.8   BMI 32.89 kg/m2 31.98 kg/m2 33.11 kg/m2 33.05 kg/m2 32.91 kg/m2 32.39 kg/m2

## 2022-09-15 NOTE — Progress Notes (Signed)
Stacey Hanson     MRN: [id number]      DOB: Dec 29, 1945  Chief Complaint  Patient presents with   Follow-up    Follow up    HPI Stacey Hanson is here for follow up and re-evaluation of chronic medical conditions, medication management and review of any available recent lab and radiology data.  Preventive health is updated, specifically  Cancer screening and Immunization.   Questions or concerns regarding consultations or procedures which the PT has had in the interim are  addressed. The PT denies any adverse reactions to current medications since the last visit.  Still has not got diabetic shoes ROS Denies recent fever or chills. Denies sinus pressure, nasal congestion, ear pain or sore throat. Denies chest congestion, productive cough or wheezing. Denies chest pains, palpitations and leg swelling Denies abdominal pain, nausea, vomiting,diarrhea or constipation.   Denies dysuria, frequency, hesitancy or incontinence. Denies joint pain, swelling and limitation in mobility. Denies headaches, seizures, numbness, or tingling. Denies depression, anxiety or insomnia. Denies skin break down or rash.   PE  BP 120/74 (BP Location: Right Arm, Patient Position: Sitting, Cuff Size: Large)   Pulse (!) 108   Ht 5\' 7"  (1.702 m)   Wt 210 lb (95.3 kg)   SpO2 94%   BMI 32.89 kg/m   Patient alert and oriented and in no cardiopulmonary distress.  HEENT: No facial asymmetry, EOMI,     Neck supple .  Chest: Clear to auscultation bilaterally.  CVS: S1, S2 no murmurs, no S3.Regular rate.  ABD: Soft non tender.   Ext: No edema  MS: Adequate though reduced  ROM spine, shoulders, hips and knees.  Skin: Intact, no ulcerations or rash noted.  Psych: Good eye contact, normal affect. Memory intact not anxious or depressed appearing.  CNS: CN 2-12 intact, power,  normal throughout.no focal deficits noted.   Assessment & Plan  Essential (primary) hypertension Controlled, no change in  medication DASH diet and commitment to daily physical activity for a minimum of 30 minutes discussed and encouraged, as a part of hypertension management. The importance of attaining a healthy weight is also discussed.     09/15/2022   10:06 AM 06/17/2022   10:55 AM 03/18/2022    2:39 PM 03/17/2022   10:14 AM 03/09/2022   11:16 AM 11/12/2021    1:13 PM 10/29/2021   10:37 AM  BP/Weight  Systolic BP 120 130 116 125 137 108 122  Diastolic BP 74 68 75 73 79 73 66  Wt. (Lbs) 210 204.2 211.4 211.04 210.12 206.8   BMI 32.89 kg/m2 31.98 kg/m2 33.11 kg/m2 33.05 kg/m2 32.91 kg/m2 32.39 kg/m2        Hypothyroidism following radioiodine therapy  Managed by endo, controlled  Obesity  Patient re-educated about  the importance of commitment to a  minimum of 150 minutes of exercise per week as able.  The importance of healthy food choices with portion control discussed, as well as eating regularly and within a 12 hour window most days. The need to choose "clean , green" food 50 to 75% of the time is discussed, as well as to make water the primary drink and set a goal of 64 ounces water daily.       09/15/2022   10:06 AM 06/17/2022   10:55 AM 03/18/2022    2:39 PM  Weight /BMI  Weight 210 lb 204 lb 3.2 oz 211 lb 6.4 oz  Height 5\' 7"  (1.702 m) 5\' 7"  (1.702 m)  5\' 7"  (1.702 m)  BMI 32.89 kg/m2 31.98 kg/m2 33.11 kg/m2    deteriorated  Type 2 diabetes mellitus with vascular disease (HCC) Stacey Hanson is reminded of the importance of commitment to daily physical activity for 30 minutes or more, as able and the need to limit carbohydrate intake to 30 to 60 grams per meal to help with blood sugar control.   The need to take medication as prescribed, test blood sugar as directed, and to call between visits if there is a concern that blood sugar is uncontrolled is also discussed.   Stacey Hanson is reminded of the importance of daily foot exam, annual eye examination, and good blood sugar, blood pressure  and cholesterol control.     Latest Ref Rng & Units 06/17/2022   11:09 AM 03/18/2022    3:25 PM 03/17/2022   11:31 AM 03/12/2022   10:53 AM 11/12/2021    1:25 PM  Diabetic Labs  HbA1c 4.0 - 5.6 % 10.5  10.9    9.8   Micro/Creat Ratio 0 - 29 mg/g creat   6     Chol 100 - 199 mg/dL    161    HDL >09 mg/dL    56    Calc LDL 0 - 99 mg/dL    93    Triglycerides 0 - 149 mg/dL    62    Creatinine 6.04 - 1.00 mg/dL    5.40        9/81/1914   10:06 AM 06/17/2022   10:55 AM 03/18/2022    2:39 PM 03/17/2022   10:14 AM 03/09/2022   11:16 AM 11/12/2021    1:13 PM 10/29/2021   10:37 AM  BP/Weight  Systolic BP 120 130 116 125 137 108 122  Diastolic BP 74 68 75 73 79 73 66  Wt. (Lbs) 210 204.2 211.4 211.04 210.12 206.8   BMI 32.89 kg/m2 31.98 kg/m2 33.11 kg/m2 33.05 kg/m2 32.91 kg/m2 32.39 kg/m2       Latest Ref Rng & Units 09/15/2022   10:20 AM 04/27/2022   12:00 AM  Foot/eye exam completion dates  Eye Exam No Retinopathy  No Retinopathy      Foot Form Completion  Done      This result is from an external source.      Uncontrolled  managed by Endo, reports improved blood sugar, has upcoming appt  Combined fat and carbohydrate induced hyperlipemia Hyperlipidemia:Low fat diet discussed and encouraged.   Lipid Panel  Lab Results  Component Value Date   CHOL 161 03/12/2022   HDL 56 03/12/2022   LDLCALC 93 03/12/2022   TRIG 62 03/12/2022   CHOLHDL 2.9 03/12/2022     Updated lab needed at/ before next visit.

## 2022-09-15 NOTE — Assessment & Plan Note (Signed)
  Patient re-educated about  the importance of commitment to a  minimum of 150 minutes of exercise per week as able.  The importance of healthy food choices with portion control discussed, as well as eating regularly and within a 12 hour window most days. The need to choose "clean , green" food 50 to 75% of the time is discussed, as well as to make water the primary drink and set a goal of 64 ounces water daily.       09/15/2022   10:06 AM 06/17/2022   10:55 AM 03/18/2022    2:39 PM  Weight /BMI  Weight 210 lb 204 lb 3.2 oz 211 lb 6.4 oz  Height 5\' 7"  (1.702 m) 5\' 7"  (1.702 m) 5\' 7"  (1.702 m)  BMI 32.89 kg/m2 31.98 kg/m2 33.11 kg/m2    deteriorated

## 2022-09-15 NOTE — Assessment & Plan Note (Signed)
Managed by endo, controlled 

## 2022-09-15 NOTE — Assessment & Plan Note (Signed)
Stacey Hanson is reminded of the importance of commitment to daily physical activity for 30 minutes or more, as able and the need to limit carbohydrate intake to 30 to 60 grams per meal to help with blood sugar control.   The need to take medication as prescribed, test blood sugar as directed, and to call between visits if there is a concern that blood sugar is uncontrolled is also discussed.   Stacey Hanson is reminded of the importance of daily foot exam, annual eye examination, and good blood sugar, blood pressure and cholesterol control.     Latest Ref Rng & Units 06/17/2022   11:09 AM 03/18/2022    3:25 PM 03/17/2022   11:31 AM 03/12/2022   10:53 AM 11/12/2021    1:25 PM  Diabetic Labs  HbA1c 4.0 - 5.6 % 10.5  10.9    9.8   Micro/Creat Ratio 0 - 29 mg/g creat   6     Chol 100 - 199 mg/dL    161    HDL >09 mg/dL    56    Calc LDL 0 - 99 mg/dL    93    Triglycerides 0 - 149 mg/dL    62    Creatinine 6.04 - 1.00 mg/dL    5.40        9/81/1914   10:06 AM 06/17/2022   10:55 AM 03/18/2022    2:39 PM 03/17/2022   10:14 AM 03/09/2022   11:16 AM 11/12/2021    1:13 PM 10/29/2021   10:37 AM  BP/Weight  Systolic BP 120 130 116 125 137 108 122  Diastolic BP 74 68 75 73 79 73 66  Wt. (Lbs) 210 204.2 211.4 211.04 210.12 206.8   BMI 32.89 kg/m2 31.98 kg/m2 33.11 kg/m2 33.05 kg/m2 32.91 kg/m2 32.39 kg/m2       Latest Ref Rng & Units 09/15/2022   10:20 AM 04/27/2022   12:00 AM  Foot/eye exam completion dates  Eye Exam No Retinopathy  No Retinopathy      Foot Form Completion  Done      This result is from an external source.      Uncontrolled  managed by Endo, reports improved blood sugar, has upcoming appt

## 2022-09-15 NOTE — Patient Instructions (Addendum)
Annual wellness with nurse schedule for January when due please at checkout  HAPPY BELATED BIRTHDAY!!  Annual exam with MD schedule for January at checkout  Please schedule mammogram first week in October  Fasting lipid panel, pls order to get this the same day she will get labs for endo ( in July)  Nurse pls order cologuard test and explain to pt again  Nurse pls send off diabetic foot exam I will do again today and f/u to see she gets her shoes  says she generally gets from [place]  Plan to get covid vaccine at pharmacy when next availanble]\  Please get flu vaccine, may call and get it here  Turn off TV at 10 pm, good rest is vital gfor good health  Please try to start regular exercise again  Thanks for choosing [facility], we consider it a privelige to serve you.

## 2022-09-15 NOTE — Assessment & Plan Note (Signed)
Hyperlipidemia:Low fat diet discussed and encouraged.   Lipid Panel  Lab Results  Component Value Date   CHOL 161 03/12/2022   HDL 56 03/12/2022   LDLCALC 93 03/12/2022   TRIG 62 03/12/2022   CHOLHDL 2.9 03/12/2022     Updated lab needed at/ before next visit.

## 2022-09-21 DIAGNOSIS — E782 Mixed hyperlipidemia: Secondary | ICD-10-CM | POA: Diagnosis not present

## 2022-09-21 DIAGNOSIS — E890 Postprocedural hypothyroidism: Secondary | ICD-10-CM | POA: Diagnosis not present

## 2022-09-21 DIAGNOSIS — E1165 Type 2 diabetes mellitus with hyperglycemia: Secondary | ICD-10-CM | POA: Diagnosis not present

## 2022-09-22 ENCOUNTER — Other Ambulatory Visit: Payer: Self-pay

## 2022-09-22 DIAGNOSIS — I10 Essential (primary) hypertension: Secondary | ICD-10-CM

## 2022-09-22 DIAGNOSIS — E782 Mixed hyperlipidemia: Secondary | ICD-10-CM

## 2022-09-22 LAB — LIPID PANEL
Chol/HDL Ratio: 3.3 ratio (ref 0.0–4.4)
Cholesterol, Total: 188 mg/dL (ref 100–199)
HDL: 57 mg/dL (ref >39)
LDL Chol Calc (NIH): 111 mg/dL — ABNORMAL HIGH (ref 0–99)
Triglycerides: 114 mg/dL (ref 0–149)
VLDL Cholesterol Cal: 20 mg/dL (ref 5–40)

## 2022-09-22 LAB — COMPREHENSIVE METABOLIC PANEL
ALT: 20 IU/L (ref 0–32)
AST: 26 IU/L (ref 0–40)
Albumin: 4.7 g/dL (ref 3.8–4.8)
Alkaline Phosphatase: 104 IU/L (ref 44–121)
BUN/Creatinine Ratio: 19 (ref 12–28)
BUN: 22 mg/dL (ref 8–27)
Bilirubin Total: 0.3 mg/dL (ref 0.0–1.2)
CO2: 17 mmol/L — ABNORMAL LOW (ref 20–29)
Calcium: 10 mg/dL (ref 8.7–10.3)
Chloride: 99 mmol/L (ref 96–106)
Creatinine, Ser: 1.13 mg/dL — ABNORMAL HIGH (ref 0.57–1.00)
Globulin, Total: 3.4 g/dL (ref 1.5–4.5)
Glucose: 209 mg/dL — ABNORMAL HIGH (ref 70–99)
Potassium: 4.8 mmol/L (ref 3.5–5.2)
Sodium: 139 mmol/L (ref 134–144)
Total Protein: 8.1 g/dL (ref 6.0–8.5)
eGFR: 50 mL/min/{1.73_m2} — ABNORMAL LOW (ref >59)

## 2022-09-22 LAB — T4, FREE: Free T4: 1.3 ng/dL (ref 0.82–1.77)

## 2022-09-22 LAB — TSH: TSH: 0.855 u[IU]/mL (ref 0.450–4.500)

## 2022-09-22 MED ORDER — ROSUVASTATIN CALCIUM 5 MG PO TABS: ORAL_TABLET | ORAL | 3 refills | Status: DC

## 2022-09-22 NOTE — Addendum Note (Signed)
Addended by: Kerri Perches on: 09/22/2022 05:55 AM   Modules accepted: Orders

## 2022-09-23 DIAGNOSIS — E1151 Type 2 diabetes mellitus with diabetic peripheral angiopathy without gangrene: Secondary | ICD-10-CM | POA: Diagnosis not present

## 2022-09-23 DIAGNOSIS — M79674 Pain in right toe(s): Secondary | ICD-10-CM | POA: Diagnosis not present

## 2022-09-23 DIAGNOSIS — M79675 Pain in left toe(s): Secondary | ICD-10-CM | POA: Diagnosis not present

## 2022-09-23 DIAGNOSIS — M79671 Pain in right foot: Secondary | ICD-10-CM | POA: Diagnosis not present

## 2022-09-23 DIAGNOSIS — M79672 Pain in left foot: Secondary | ICD-10-CM | POA: Diagnosis not present

## 2022-09-28 ENCOUNTER — Encounter: Payer: Self-pay | Admitting: Nurse Practitioner

## 2022-09-28 ENCOUNTER — Ambulatory Visit: Payer: Medicare Other | Admitting: Nurse Practitioner

## 2022-09-28 VITALS — BP 101/65 | HR 102 | Ht 67.0 in | Wt 208.2 lb

## 2022-09-28 DIAGNOSIS — Z7985 Long-term (current) use of injectable non-insulin antidiabetic drugs: Secondary | ICD-10-CM

## 2022-09-28 DIAGNOSIS — E890 Postprocedural hypothyroidism: Secondary | ICD-10-CM

## 2022-09-28 DIAGNOSIS — I10 Essential (primary) hypertension: Secondary | ICD-10-CM | POA: Diagnosis not present

## 2022-09-28 DIAGNOSIS — E1165 Type 2 diabetes mellitus with hyperglycemia: Secondary | ICD-10-CM

## 2022-09-28 DIAGNOSIS — E782 Mixed hyperlipidemia: Secondary | ICD-10-CM

## 2022-09-28 DIAGNOSIS — Z7984 Long term (current) use of oral hypoglycemic drugs: Secondary | ICD-10-CM

## 2022-09-28 DIAGNOSIS — Z794 Long term (current) use of insulin: Secondary | ICD-10-CM

## 2022-09-28 LAB — POCT GLYCOSYLATED HEMOGLOBIN (HGB A1C): Hemoglobin A1C: 8 % — AB (ref 4.0–5.6)

## 2022-09-28 MED ORDER — GLIPIZIDE ER 2.5 MG PO TB24: 2.5000 mg | ORAL_TABLET | Freq: Every day | ORAL | 0 refills | Status: DC

## 2022-09-28 MED ORDER — TRESIBA FLEXTOUCH 100 UNIT/ML ~~LOC~~ SOPN: 60.0000 [IU] | PEN_INJECTOR | Freq: Every day | SUBCUTANEOUS | 3 refills | Status: DC

## 2022-09-28 NOTE — Patient Instructions (Signed)

## 2022-09-28 NOTE — Progress Notes (Signed)
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    Knee pain    Obesity    Past Surgical History:  Procedure Laterality Date   ABDOMINAL HYSTERECTOMY  03/02/1968   CATARACT EXTRACTION W/PHACO Left 03/09/2019   Procedure: CATARACT EXTRACTION PHACO AND INTRAOCULAR LENS PLACEMENT (IOC);  Surgeon: Fabio Pierce, MD;  Location: [facility];  Service: Ophthalmology;  Laterality: Left;  CDE: 9.08   CATARACT EXTRACTION W/PHACO Right 03/27/2019   Procedure: CATARACT EXTRACTION PHACO AND INTRAOCULAR LENS PLACEMENT RIGHT EYE;  Surgeon: Fabio Pierce, MD;  Location: [facility];  Service: Ophthalmology;  Laterality: Right;  CDE: 7.47   CHOLECYSTECTOMY  OCT 2010 BZ BILIARY DYSKINESIA   CHRONIC CHOLECYSTITIS   COLONOSCOPY  MAY 2009 SCREENING   pTC TICS, [facility] IH   COLONOSCOPY N/A 08/11/2017   Procedure: COLONOSCOPY;  Surgeon: West Bali, MD;  Location: [facility];  Service: Endoscopy;  Laterality: N/A;  9:30am   ESOPHAGOGASTRODUODENOSCOPY  10/03/2010   Procedure: ESOPHAGOGASTRODUODENOSCOPY (EGD);  Surgeon: Arlyce Harman, MD;  Location: [facility];  Service: Endoscopy;  Laterality: N/A;   ESOPHAGOGASTRODUODENOSCOPY  12/19/2002   RMR: Normal esophagus/Normal stomach/ Duodenum large bulbar diverticulum, 1 cm bulbar ulcer with surrounding/ inflammation as described above. Normal D2   FOOT SURGERY Left 01/29/2021   POLYPECTOMY  08/11/2017   Procedure: POLYPECTOMY;  Surgeon: West Bali, MD;  Location: [facility];  Service: Endoscopy;;  cecal     SAVORY DILATION  10/03/2010   Procedure: SAVORY DILATION;  Surgeon: Arlyce Harman, MD;  Location: [facility];  Service: Endoscopy;;   TOTAL ABDOMINAL HYSTERECTOMY  03/02/1968   TOTAL KNEE ARTHROPLASTY Left 12/21/2017   Procedure: LEFT TOTAL KNEE ARTHROPLASTY;  Surgeon: Durene Romans, MD;  Location: [facility];  Service: Orthopedics;  Laterality: Left;  70 mins block   UPPER GASTROINTESTINAL ENDOSCOPY  OCT 2004 RMR   DUO TIC, & ULCER   Social History   Socioeconomic History   Marital status: Single    Spouse name: Not on file   Number of children: 3   Years of education: 10   Highest education level: 10th grade  Occupational History   Occupation: [employer]   Occupation: Retired   Tobacco Use   Smoking status: Former    Current packs/day: 0.00    Average packs/day: 0.3 packs/day for 10.0 years (2.5 ttl pk-yrs)    Types: Cigarettes    Start date: 02/21/1975    Quit date: 02/20/1985    Years since quitting: 37.6   Smokeless tobacco: Never  Vaping Use   Vaping status: Never Used  Substance and Sexual Activity   Alcohol use: No    Comment: stopped drinking 1998   Drug use: No   Sexual activity: Not Currently  Other Topics Concern   Not on file  Social History Narrative   Lives alone and 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   Social Determinants of Corporate investment banker  Strain: Low Risk  (05/14/2022)   Overall Financial Resource Strain (CARDIA)    Difficulty of Paying Living Expenses: Not hard at all  Food Insecurity: No Food Insecurity (05/14/2022)   Hunger Vital Sign    Worried About Running Out of Food in the Last Year: Never true    Ran Out of Food in the Last Year: Never true  Transportation Needs: Unmet Transportation Needs (04/15/2022)   PRAPARE - Transportation    Lack of Transportation (Medical): Yes    Lack of Transportation (Non-Medical): Yes  Physical Activity: Inactive (03/09/2022)   Exercise Vital Sign    Days of Exercise per Week: 0 days    Minutes  of Exercise per Session: 0 min  Stress: No Stress Concern Present (05/14/2022)   [employer] of Occupational Health - Occupational Stress Questionnaire    Feeling of Stress : Not at all  Social Connections: Moderately Isolated (05/14/2022)   Social Connection and Isolation Panel [NHANES]    Frequency of Communication with Friends and Family: Three times a week    Frequency of Social Gatherings with Friends and Family: Once a week    Attends Religious Services: Never    Database administrator or Organizations: Yes    Attends Banker Meetings: 1 to 4 times per year    Marital Status: Never married   Outpatient Encounter Medications as of 09/28/2022  Medication Sig   alendronate (FOSAMAX) 70 MG tablet TAKE 1 TABLET BY MOUTH WEEKLY  WITH 8 OZ OF PLAIN WATER 30  MINUTES BEFORE FIRST FOOD, DRINK OR MEDS. STAY UPRIGHT FOR 30  MINS   amLODipine-benazepril (LOTREL) 5-40 MG capsule TAKE 1 CAPSULE BY MOUTH DAILY   aspirin EC 81 MG tablet Take 81 mg by mouth daily.   Blood Glucose Monitoring Suppl (ONE TOUCH ULTRA 2) w/Device KIT Twice daily testing dx e11.65   Calcium Carb-Cholecalciferol (CALCIUM 600/VITAMIN D3 PO) Take 2 tablets by mouth daily.   hydrochlorothiazide (HYDRODIURIL) 25 MG tablet TAKE 1 TABLET BY MOUTH DAILY   Insulin Pen Needle (B-D UF III MINI PEN NEEDLES) 31G X 5 MM MISC USE FOR TWICE DAILY INJECTIONS  OF HUMALOG   Lancets (ONETOUCH DELICA PLUS LANCET33G) MISC USE TWICE DAILY TO CHECK BLOOD  SUGAR   levothyroxine (SYNTHROID) 88 MCG tablet TAKE 1 TABLET BY MOUTH DAILY  BEFORE BREAKFAST   Multiple Vitamin (MULTIVITAMIN) capsule Take 1 capsule by mouth daily.   omeprazole (PRILOSEC) 20 MG capsule TAKE 1 CAPSULE BY MOUTH IN THE  MORNING   ONETOUCH ULTRA test strip USE TO CHECK BLOOD SUGAR TWICE  DAILY   polyethylene glycol (MIRALAX / GLYCOLAX) 17 g packet Take 17 g by mouth as needed.   psyllium (METAMUCIL) 58.6 % packet Take 1 packet by mouth as needed.   rosuvastatin  (CRESTOR) 5 MG tablet Take one tablet by mouth five days per week, Monday though Friday   Semaglutide, 1 MG/DOSE, 4 MG/3ML SOPN Inject 1 mg as directed once a week.   UNABLE TO FIND Diabetic shoes x 1 pair, Inserts x 3 pair DX E11.9   [DISCONTINUED] glipiZIDE (GLUCOTROL XL) 2.5 MG 24 hr tablet TAKE 1 TABLET BY MOUTH DAILY  WITH BREAKFAST   [DISCONTINUED] insulin degludec (TRESIBA FLEXTOUCH) 100 UNIT/ML FlexTouch Pen Inject 60 Units into the skin at bedtime.   glipiZIDE (GLUCOTROL XL) 2.5 MG 24 hr tablet Take 1 tablet (2.5 mg total) by mouth daily with breakfast.   insulin degludec (TRESIBA FLEXTOUCH) 100 UNIT/ML FlexTouch Pen Inject 60  Units into the skin at bedtime.   No facility-administered encounter medications on file as of 09/28/2022.   ALLERGIES: Allergies  Allergen Reactions   Pravastatin Other (See Comments)    PT is currently taking but states she cramps   Simvastatin Other (See Comments)    Muscle cramps Other reaction(s): Not available   VACCINATION STATUS: Immunization History  Administered Date(s) Administered   Fluad Quad(high Dose 65+) 11/23/2018, 12/21/2019, 12/10/2020, 11/12/2021   Influenza,inj,Quad PF,6+ Mos 11/16/2012, 11/15/2013, 11/14/2014, 11/14/2015, 11/03/2016, 11/09/2017   Influenza,inj,quad, With Preservative 11/02/2016   Moderna SARS-COV2 Booster Vaccination 07/24/2020, 05/26/2021   Moderna Sars-Covid-2 Vaccination 04/27/2019, 05/26/2019, 01/08/2020   Pneumococcal Conjugate-13 10/11/2013   Pneumococcal Polysaccharide-23 10/07/2011   Tdap 12/16/2020   Zoster Recombinant(Shingrix) 12/16/2020   Zoster, Live 04/05/2006   Zoster, Unspecified 06/16/2021    Hypertension This is a chronic problem. The current episode started more than 1 year ago. The problem has been resolved since onset. The problem is controlled. Pertinent negatives include no blurred vision, headaches, palpitations, shortness of breath or sweats. Agents associated with hypertension include  thyroid hormones. Risk factors for coronary artery disease include diabetes mellitus, dyslipidemia, obesity, sedentary lifestyle and smoking/tobacco exposure. Past treatments include ACE inhibitors, diuretics and calcium channel blockers. The current treatment provides mild improvement. There are no compliance problems.  Hypertensive end-organ damage includes kidney disease. Identifiable causes of hypertension include chronic renal disease and a thyroid problem.  Diabetes She presents for her follow-up diabetic visit. She has type 2 diabetes mellitus. Onset time: She was diagnosed at approximate age of 40 years. Her disease course has been improving. Pertinent negatives for hypoglycemia include no confusion, headaches, nervousness/anxiousness, sweats or tremors. Pertinent negatives for diabetes include no blurred vision, no fatigue, no foot paresthesias, no polydipsia, no polyphagia, no polyuria and no weight loss. There are no hypoglycemic complications. Symptoms are stable. Diabetic complications include nephropathy. Risk factors for coronary artery disease include dyslipidemia, diabetes mellitus, obesity, hypertension, sedentary lifestyle, post-menopausal and tobacco exposure. Current diabetic treatment includes insulin injections and oral agent (monotherapy) (and Ozempic). She is compliant with treatment most of the time. Her weight is fluctuating minimally. She is following a generally unhealthy diet. When asked about meal planning, she reported none. She has not had a previous visit with a dietitian. She rarely participates in exercise. Her home blood glucose trend is decreasing steadily. Her breakfast blood glucose range is generally 130-140 mg/dl. Her dinner blood glucose range is generally 180-200 mg/dl. (She presents today with her meter and logs showing improved, mostly at target glycemic profile overall.  Her POCT A1c today is 8%, improving from last visit of 10.5%.  She denies any hypoglycemia.  She  notes Crestor tends to increase her glucose levels.) An ACE inhibitor/angiotensin II receptor blocker is being taken. She does not see a podiatrist.Eye exam is current.  Thyroid Problem Presents for follow-up (-She is also status post radioactive iodine treatment for toxic nodule on October 17, 2010.  She has had  history of multinodular goiter status post fine-needle aspiration with benign findings.) visit. Patient reports no anxiety, cold intolerance, constipation, depressed mood, diarrhea, fatigue, heat intolerance, leg swelling, palpitations, tremors, weight gain or weight loss. The symptoms have been stable.     Review of systems  Constitutional: + stable body weight,  current Body mass index is 32.61 kg/m. , no fatigue, no subjective hyperthermia, no subjective hypothermia Eyes: no blurry vision, no xerophthalmia ENT: no sore throat, no nodules palpated in throat, no dysphagia/odynophagia, no hoarseness  Cardiovascular: no chest pain, no shortness of breath, no palpitations, no leg swelling Respiratory: no cough, no shortness of breath Gastrointestinal: no nausea/vomiting/diarrhea Musculoskeletal: no muscle/joint aches Skin: no rashes, no hyperemia Neurological: no tremors, no numbness, no tingling, no dizziness Psychiatric: no depression, no anxiety    Objective:    BP 101/65 (BP Location: Left Arm, Patient Position: Sitting, Cuff Size: Large)   Pulse (!) 102   Ht 5\' 7"  (1.702 m)   Wt 208 lb 3.2 oz (94.4 kg)   BMI 32.61 kg/m   Wt Readings from Last 3 Encounters:  09/28/22 208 lb 3.2 oz (94.4 kg)  09/15/22 210 lb (95.3 kg)  06/17/22 204 lb 3.2 oz (92.6 kg)    BP Readings from Last 3 Encounters:  09/28/22 101/65  09/15/22 120/74  06/17/22 130/68    Physical Exam- Limited  Constitutional:  Body mass index is 32.61 kg/m. , not in acute distress, normal state of mind, ? Memory deficits Eyes:  EOMI, no exophthalmos Musculoskeletal: no gross deformities, strength intact in  all four extremities, no gross restriction of joint movements Skin:  no rashes, no hyperemia Neurological: no tremor with outstretched hands    CMP     Component Value Date/Time   NA 139 09/21/2022 0932   K 4.8 09/21/2022 0932   CL 99 09/21/2022 0932   CO2 17 (L) 09/21/2022 0932   GLUCOSE 209 (H) 09/21/2022 0932   GLUCOSE 395 (H) 12/28/2020 2201   BUN 22 09/21/2022 0932   CREATININE 1.13 (H) 09/21/2022 0932   CREATININE 0.96 (H) 03/12/2016 0925   CALCIUM 10.0 09/21/2022 0932   PROT 8.1 09/21/2022 0932   ALBUMIN 4.7 09/21/2022 0932   AST 26 09/21/2022 0932   ALT 20 09/21/2022 0932   ALKPHOS 104 09/21/2022 0932   BILITOT 0.3 09/21/2022 0932   GFRNONAA 40 (L) 12/28/2020 2201   GFRNONAA 60 03/12/2016 0925   GFRAA 48 (L) 04/11/2020 1007   GFRAA 69 03/12/2016 0925     Diabetic Labs (most recent): Lab Results  Component Value Date   HGBA1C 8.0 (A) 09/28/2022   HGBA1C 10.5 (A) 06/17/2022   HGBA1C 10.9 (A) 03/18/2022   MICROALBUR 10 04/19/2020   MICROALBUR 0.4 03/12/2016   MICROALBUR 0.2 02/19/2014     Lipid Panel ( most recent) Lipid Panel     Component Value Date/Time   CHOL 188 09/21/2022 0938   TRIG 114 09/21/2022 0938   HDL 57 09/21/2022 0938   CHOLHDL 3.3 09/21/2022 0938   CHOLHDL 2.2 01/11/2016 0916   VLDL 11 01/11/2016 0916   LDLCALC 111 (H) 09/21/2022 0938    Assessment & Plan:   1) Uncontrolled type 2 diabetes mellitus with stage 3a chronic kidney disease, with long-term current use of insulin (HCC)  She presents today with her meter and logs showing improved, mostly at target glycemic profile overall.  Her POCT A1c today is 8%, improving from last visit of 10.5%.  She denies any hypoglycemia.  She notes Crestor tends to increase her glucose levels.  Analysis of her meter shows 7-day average of 156, 14-day average of 171, 30-day average of 170.  - Nutritional counseling repeated at each appointment due to patients tendency to fall back in to old  habits.  - The patient admits there is a room for improvement in their diet and drink choices. -  Suggestion is made for the patient to avoid simple carbohydrates from their diet including Cakes, Sweet Desserts / Pastries, Ice Cream, Soda (diet and regular),  Sweet Tea, Candies, Chips, Cookies, Sweet Pastries, Store Bought Juices, Alcohol in Excess of 1-2 drinks a day, Artificial Sweeteners, Coffee Creamer, and "Sugar-free" Products. This will help patient to have stable blood glucose profile and potentially avoid unintended weight gain.   - I encouraged the patient to switch to unprocessed or minimally processed complex starch and increased protein intake (animal or plant source), fruits, and vegetables.   - Patient is advised to stick to a routine mealtimes to eat 3 meals a day and avoid unnecessary snacks (to snack only to correct hypoglycemia).  -Given her improved glycemic profile, no changes will be made to her regimen today.  She can continue Tresiba 60 units SQ nightly, Ozempic 1 mg SQ weekly and Glipizide 2.5 mg XL daily with breakfast.   She notes her stomach has felt much better since the discontinuation of Metformin and wishes to stay off of it.   -She is advised to continue monitoring blood glucose twice daily, before breakfast and before bed, and to call the clinic if she has readings less than 70 or above 200 for 3 tests in a row.  She does not want a CGM, has tried it and does not like it.  2) Hypothyroidism- s/p RAI -Her previsit TFTs are consistent with appropriate hormone replacement.  She is advised to continue Levothyroxine 88 mcg po daily before breakfast.    - We discussed about the correct intake of her thyroid hormone, on empty stomach at fasting, with water, separated by at least 30 minutes from breakfast and other medications,  and separated by more than 4 hours from calcium, iron, multivitamins, acid reflux medications (PPIs). -Patient is made aware of the fact that  thyroid hormone replacement is needed for life, dose to be adjusted by periodic monitoring of thyroid function tests.  3) Weight management:  Her Body mass index is 32.61 kg/m.-a candidate for modest weight loss.  Patient specific carbs restrictions and exercise regimen discussed with her.  4) Blood pressure/ Hypertension: -Her blood pressure is controlled to target.  She is advised to continue Amlodipine Benazepril 5-40 mg po daily and HCTZ 25 mg po daily.    5) Hyperlipidemia: Her recent lipid panel from 09/21/22 showed uncontrolled LDL of 111.  She is advised to continue Crestor 5 mg po daily at bedtime (taking it 5 days a week).  Side effects and precautions discussed with her.  - I advised patient to maintain close follow up with Kerri Perches, MD for diabetes and all other primary care needs.      I spent  28  minutes in the care of the patient today including review of labs from CMP, Lipids, Thyroid Function, Hematology (current and previous including abstractions from other facilities); face-to-face time discussing  her blood glucose readings/logs, discussing hypoglycemia and hyperglycemia episodes and symptoms, medications doses, her options of short and long term treatment based on the latest standards of care / guidelines;  discussion about incorporating lifestyle medicine;  and documenting the encounter. Risk reduction counseling performed per USPSTF guidelines to reduce obesity and cardiovascular risk factors.     Please refer to Patient Instructions for Blood Glucose Monitoring and Insulin/Medications Dosing Guide"  in media tab for additional information. Please  also refer to " Patient Self Inventory" in the Media  tab for reviewed elements of pertinent patient history.  Stacey Hanson participated in the discussions, expressed understanding, and voiced agreement with the above plans.  All questions were answered to her satisfaction. she is  encouraged to contact clinic  should she have any questions or concerns prior to her return visit.    Follow up plan: Return in about 4 months (around 01/29/2023) for Diabetes F/U with A1c in office, No previsit labs, Bring meter and logs.   Ronny Bacon, [facility] [address] [place], [place] [address] Phone: [phone] Fax: [phone]  09/28/2022, 10:56 AM

## 2022-09-29 ENCOUNTER — Ambulatory Visit: Payer: Self-pay | Admitting: *Deleted

## 2022-09-29 NOTE — Patient Instructions (Addendum)
Visit Information  Thank you for taking time to visit with me today. Please don't hesitate to contact me if I can be of assistance to you.   Following are the goals we discussed today:   Goals Addressed               This Visit's Progress     Patient Stated     [facility] care coordination services (Diabetes, Hypothyroidism, diabetic shoes, preventive care, medicines) (pt-stated)   On track      Care Coordination Interventions: Interventions Today    Flowsheet Row Most Recent Value  Chronic Disease   Chronic disease during today's visit Diabetes, Hypertension (HTN), Chronic Kidney Disease/End Stage Renal Disease (ESRD), Other  General Interventions   General Interventions Discussed/Reviewed General Interventions Reviewed, Labs, Lipid Profile, Sick Day Rules, Doctor Visits  Labs Hgb A1c every 3 months  Doctor Visits Discussed/Reviewed Doctor Visits Reviewed, PCP, Specialist  PCP/Specialist Visits Compliance with follow-up visit  Exercise Interventions   Exercise Discussed/Reviewed Exercise Reviewed, Weight Managment  Physical Activity Discussed/Reviewed Physical Activity Reviewed, Types of exercise  [during bedside exercises at home Wnt to start at the [facility]  Weight Management Weight maintenance  Education Interventions   Education Provided Provided Education  [how to obtain a cologuard specimen]  Provided Verbal Education On Blood Sugar Monitoring, Medication, Exercise, Community Resources  [name]  Labs Reviewed Lipid Profile, Hgb A1c             Our next appointment is by telephone on 02/02/23 at 3 pm  Please call the care guide team at [phone] if you need to cancel or reschedule your appointment.   If you are experiencing a Mental Health or Behavioral Health Crisis or need someone to talk to, please call the Suicide and Crisis Lifeline: 988 call the [place] National Suicide Prevention Lifeline: 954-592-1814 or TTY: (734)239-1735 TTY (405)128-6456) to talk to a trained  counselor call 1-800-273-TALK (toll free, 24 hour hotline) call the [facility]: (628) 663-8499 call 911   No computer access, no preference for copy of AVS     The patient has been provided with contact information for the care management team and has been advised to call with any health related questions or concerns.   Januel Doolan L. Noelle Penner, RN, BSN, CCM [facility] Care Management Community Coordinator Office number [phone]

## 2022-09-29 NOTE — Patient Outreach (Signed)
  Care Coordination   Follow Up Visit Note   09/29/2022 Name: Stacey Hanson MRN: [id number] DOB: September 01, 1945  Stacey Hanson is a 77 y.o. year old female who sees Stacey Perches, MD for primary care. I spoke with  Stacey Hanson by phone today.  What matters to the patients health and wellness today?  Recent office visits with pcp and endocrinology  Pending completion of a Cologuard specimen Diabetes A1c has decreased form 10.5 to 8   Goals Addressed               This Visit's Progress     Patient Stated     [facility] care coordination services (Diabetes, Hypothyroidism, diabetic shoes, preventive care, medicines) (pt-stated)   On track      Care Coordination Interventions: Interventions Today    Flowsheet Row Most Recent Value  Chronic Disease   Chronic disease during today's visit Diabetes, Hypertension (HTN), Chronic Kidney Disease/End Stage Renal Disease (ESRD), Other  General Interventions   General Interventions Discussed/Reviewed General Interventions Reviewed, Labs, Lipid Profile, Sick Day Rules, Doctor Visits  Labs Hgb A1c every 3 months  Doctor Visits Discussed/Reviewed Doctor Visits Reviewed, PCP, Specialist  PCP/Specialist Visits Compliance with follow-up visit  Exercise Interventions   Exercise Discussed/Reviewed Exercise Reviewed, Weight Managment  Physical Activity Discussed/Reviewed Physical Activity Reviewed, Types of exercise  [during bedside exercises at home Wnt to start at the [facility]  Weight Management Weight maintenance  Education Interventions   Education Provided Provided Education  [how to obtain a cologuard specimen]  Provided Verbal Education On Blood Sugar Monitoring, Medication, Exercise, Community Resources  [name]  Labs Reviewed Lipid Profile, Hgb A1c             SDOH assessments and interventions completed:  No     Care Coordination Interventions:  Yes, provided   Follow up plan: Follow up call scheduled for 02/02/23    Encounter  Outcome:  Pt. Visit Completed   Stacey Hanson L. Noelle Penner, RN, BSN, CCM [facility] Care Management Community Coordinator Office number [phone]

## 2022-10-03 ENCOUNTER — Other Ambulatory Visit: Payer: Self-pay | Admitting: Family Medicine

## 2022-10-03 DIAGNOSIS — E1165 Type 2 diabetes mellitus with hyperglycemia: Secondary | ICD-10-CM

## 2022-10-23 ENCOUNTER — Telehealth: Payer: Self-pay | Admitting: Family Medicine

## 2022-10-23 NOTE — Telephone Encounter (Signed)
Called from [employer] needs approval for switching manufacturing change on Levothroxine . Must have providers approval to do so. Call back # [phone] and use order # [id number].

## 2022-10-26 NOTE — Telephone Encounter (Signed)
I just gave verbal approval for a manufacturer change with l thyroxine, dose remains the same,  pls let her know Thanks

## 2022-11-29 ENCOUNTER — Other Ambulatory Visit: Payer: Self-pay | Admitting: Nurse Practitioner

## 2022-12-02 ENCOUNTER — Ambulatory Visit (HOSPITAL_COMMUNITY): Payer: Medicare Other

## 2022-12-02 ENCOUNTER — Ambulatory Visit (HOSPITAL_COMMUNITY)
Admission: RE | Admit: 2022-12-02 | Discharge: 2022-12-02 | Disposition: A | Discharge status: Home / Self Care | Payer: Medicare Other | Source: Ambulatory Visit | Attending: Family Medicine | Admitting: Family Medicine

## 2022-12-02 ENCOUNTER — Encounter (HOSPITAL_COMMUNITY): Payer: Self-pay

## 2022-12-02 DIAGNOSIS — Z1231 Encounter for screening mammogram for malignant neoplasm of breast: Secondary | ICD-10-CM | POA: Insufficient documentation

## 2022-12-09 DIAGNOSIS — M79671 Pain in right foot: Secondary | ICD-10-CM | POA: Diagnosis not present

## 2022-12-09 DIAGNOSIS — M79674 Pain in right toe(s): Secondary | ICD-10-CM | POA: Diagnosis not present

## 2022-12-09 DIAGNOSIS — M79672 Pain in left foot: Secondary | ICD-10-CM | POA: Diagnosis not present

## 2022-12-09 DIAGNOSIS — M79675 Pain in left toe(s): Secondary | ICD-10-CM | POA: Diagnosis not present

## 2022-12-09 DIAGNOSIS — E1151 Type 2 diabetes mellitus with diabetic peripheral angiopathy without gangrene: Secondary | ICD-10-CM | POA: Diagnosis not present

## 2022-12-23 ENCOUNTER — Other Ambulatory Visit: Payer: Self-pay | Admitting: Nurse Practitioner

## 2023-01-21 IMAGING — MR MR LUMBAR SPINE W/O CM
4 of 5 series · 30 of 48 positions shown · non-contrast
Comparison: Lumbar MRI 09/11/2009.

CLINICAL DATA: 74-year-old female with persistent low back pain and
left hip pain with no known injury.

EXAM:
MRI LUMBAR SPINE WITHOUT CONTRAST
TECHNIQUE: Multiplanar, multisequence MR imaging of the lumbar spine was
performed. No intravenous contrast was administered.

[Series 5: T2 · sagittal · 4.0mm · 0.68mm/px · 6 of 15 slices shown (1 of 2)]
[im 1/15]
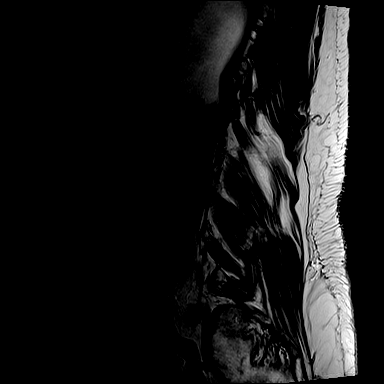
[im 3/15]
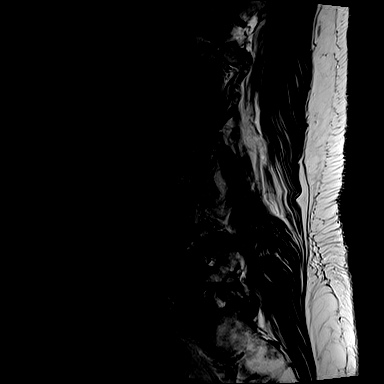
[im 6/15]
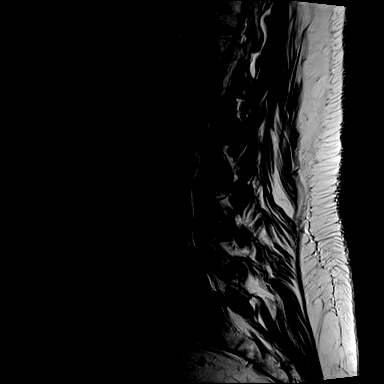
[im 9/15]
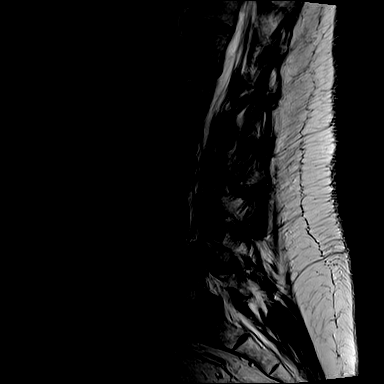
[im 12/15]
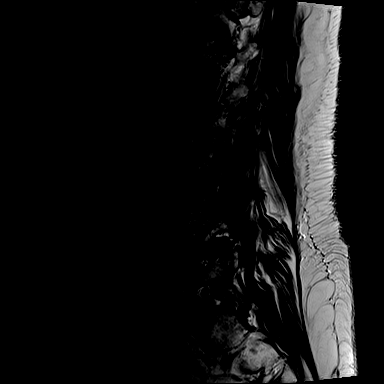
[im 15/15]
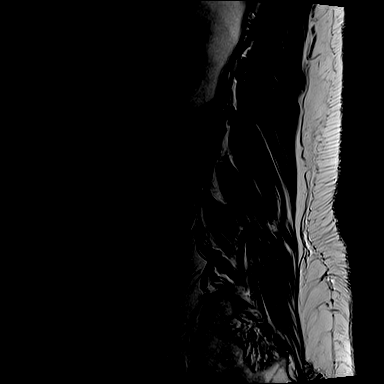

[Series 6: T1 · sagittal · 4.0mm · 0.81mm/px · 6 of 15 slices shown (1 of 2)]
[im 1/15]
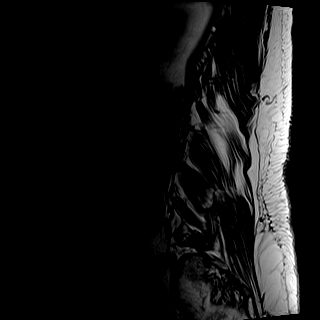
[im 3/15]
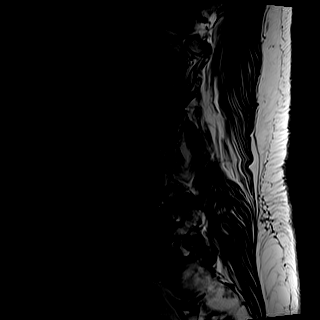
[im 6/15]
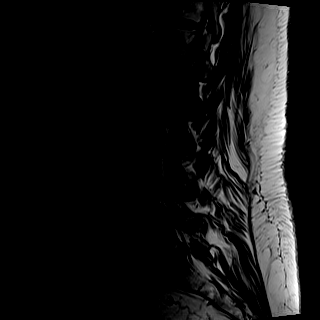
[im 9/15]
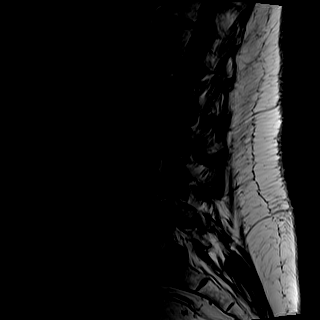
[im 12/15]
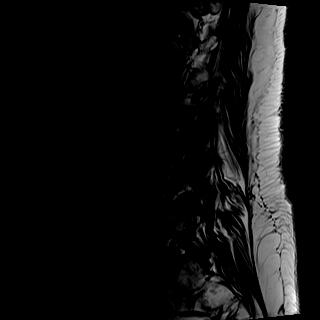
[im 15/15]
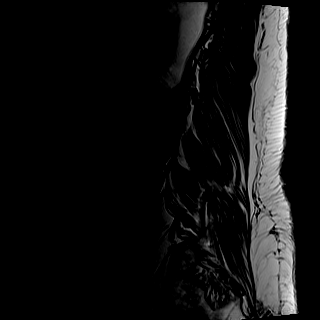

[Series 8: T2 · axial · 4.0mm · 0.70mm/px · z∈[-140,+83]mm · 9 of 39 slices shown (2 of 2)]
[im 1/39]
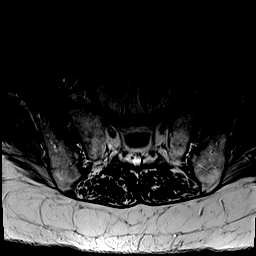
[im 6/39]
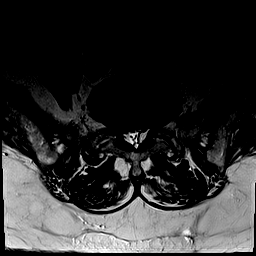
[im 11/39]
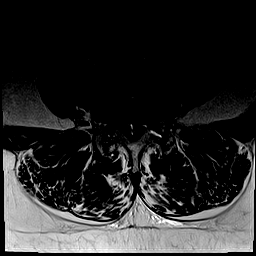
[im 17/39]
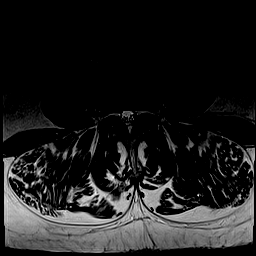
[im 20/39]
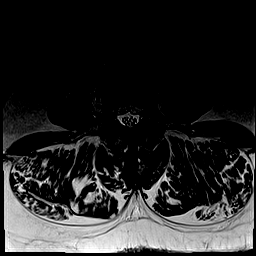
[im 22/39]
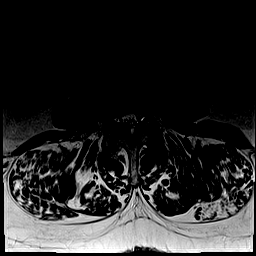
[im 28/39]
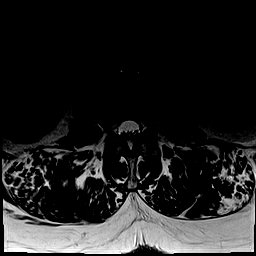
[im 33/39]
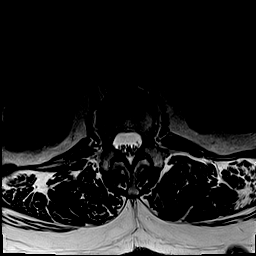
[im 39/39]
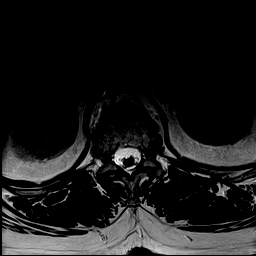

[Series 9: T1 · axial · 4.0mm · 0.35mm/px · z∈[-140,+83]mm · 9 of 39 slices shown (2 of 2)]
[im 1/39]
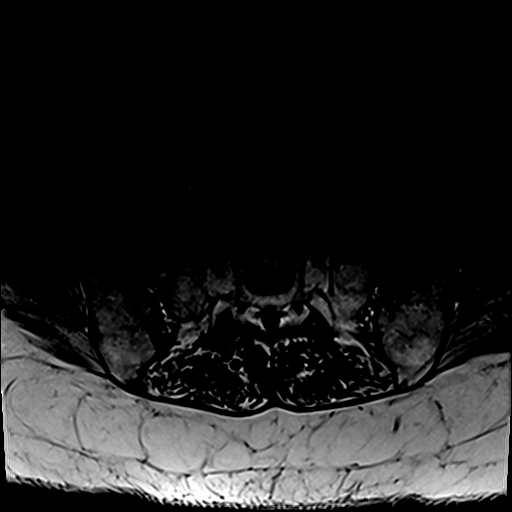
[im 6/39]
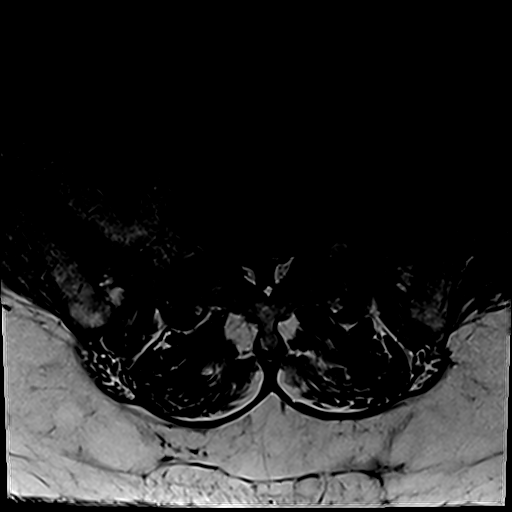
[im 11/39]
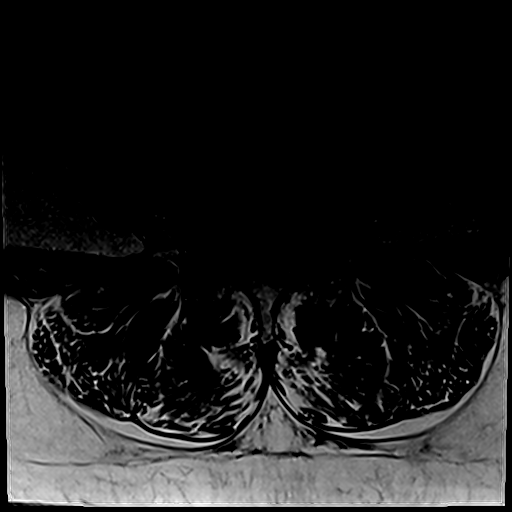
[im 17/39]
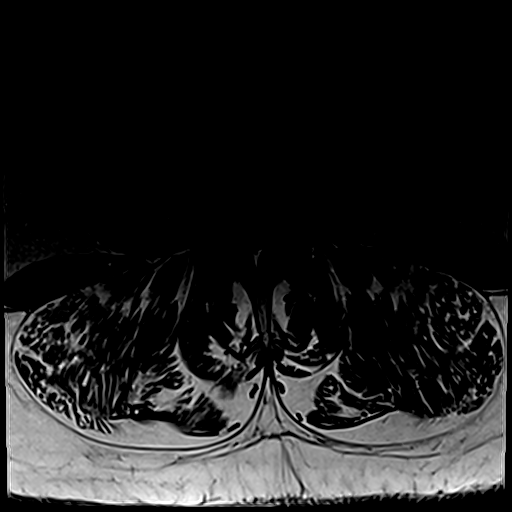
[im 20/39]
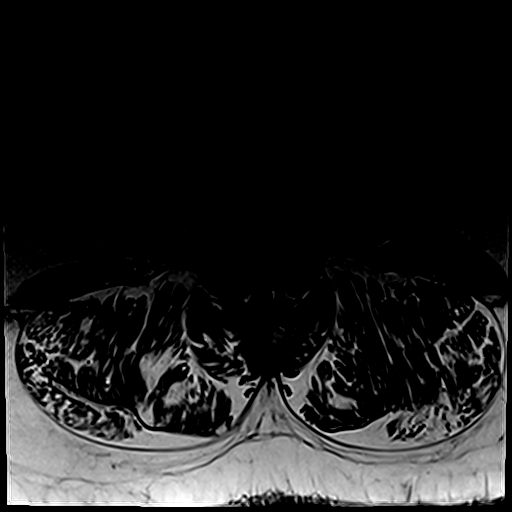
[im 22/39]
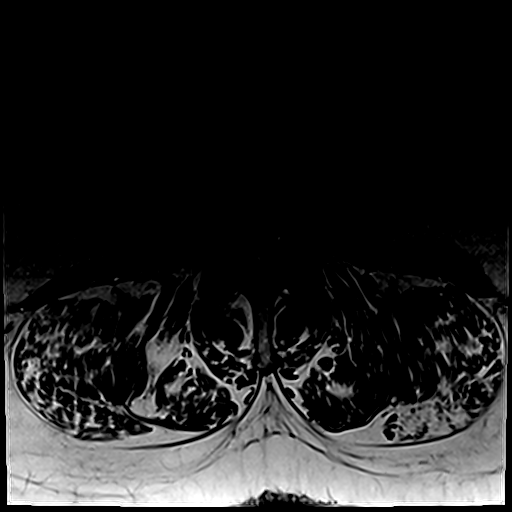
[im 28/39]
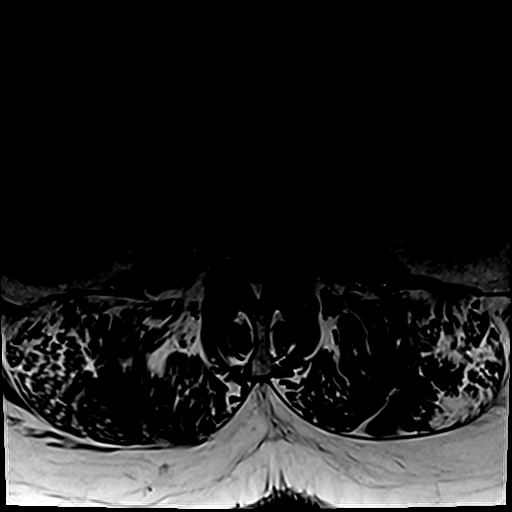
[im 33/39]
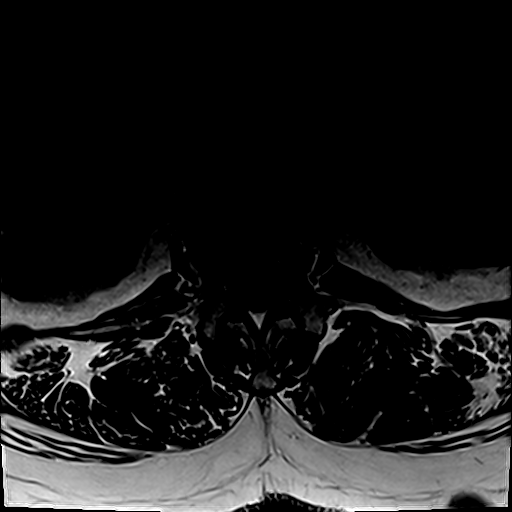
[im 39/39]
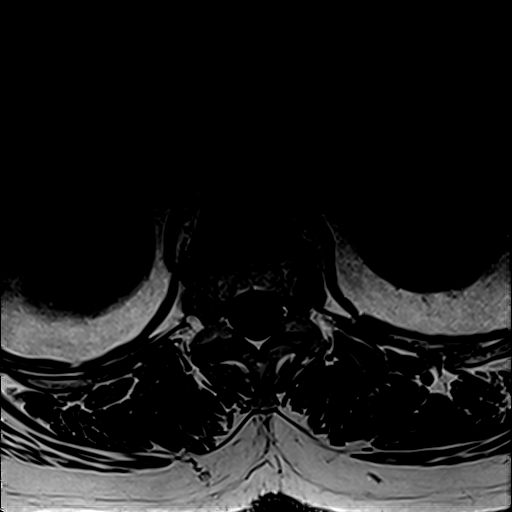

[30 of 48 positions shown; findings below may reference images not displayed]

FINDINGS: Segmentation: Lumbar segmentation appears to be normal and is the
same numbering system used in 8233.

Alignment: Grade 1 anterolisthesis of L4 on L5 measuring 6-7 mm is
stable since 8233. Subtle anterolisthesis of L2 on L3 is stable.
Preserved lumbar lordosis otherwise.

Vertebrae: Chronic mild L1 superior endplate fracture is new since
8233. No retropulsion or complicating features. Faint degenerative
appearing superimposed posterior T12-L1 endplate marrow edema. No
other marrow edema or acute osseous abnormality. Normal background
bone marrow signal. Intact visible sacrum and SI joints.

Conus medullaris and cauda equina: Conus extends to the L1 level. No
lower spinal cord or conus signal abnormality.

Paraspinal and other soft tissues: Negative; small benign appearing
right renal cysts.

Disc levels:

T11-T12: Mild facet hypertrophy.  No stenosis.

T12-L1:  Disc desiccation and subtle new disc bulging.  No stenosis.

L1-L2:  Stable, negative.

L2-L3: Disc space loss since 8233. Increased but mostly far lateral
disc bulging. Mild facet and ligament flavum hypertrophy. Mild new
spinal stenosis and bilateral L2 foraminal stenosis.

L3-L4: Lobulated increased soft tissue in the ventral epidural space
posterior to L3 appears related to venous engorgement rather than
sequestered disc or other space-occupying lesion. Far lateral disc
bulging is chronic. Moderate facet and ligament flavum hypertrophy
appears increased along with other epidural lipomatosis. Mild new
spinal stenosis. Stable borderline to mild L3 foraminal stenosis.

L4-L5: Chronic anterolisthesis with bulky leftward disc
herniation/pseudo with increased disc tracking into the left neural
foramen (series 5, image 11 and series 8, image 28). Severe chronic
facet hypertrophy. Increased severe ligament flavum hypertrophy.
Degenerative new facet joint fluid greater on the left.

Very severe spinal, left greater than right lateral recess, and left
L4 neural foraminal stenosis. Moderate to severe right neural
foraminal stenosis has not significantly changed.

L5-S1: Right eccentric circumferential disc bulge and moderate facet
hypertrophy. Trace new degenerative facet joint fluid. Increased
epidural lipomatosis with mild new spinal stenosis. Mild right
lateral recess stenosis is increased. Mild to moderate bilateral L5
foraminal stenosis has not significantly changed.
IMPRESSION: 1. Chronic grade 1 anterolisthesis at L4-L5 with increasingly bulky
leftward disc herniation and severe posterior element degeneration.
Subsequent progression since 8233 of Very Severe spinal, left
greater than right lateral recess, and left foraminal stenosis.
Query Left L4 and/or L5 radiculitis.
2. Lesser progression of lumbar spine degeneration elsewhere since
8233. Mild new multifactorial spinal stenosis at L3-L4. Mild new
spinal and right lateral recess stenosis at L5-S1. Mild new spinal
and bilateral foraminal stenosis at L2-L3.
3. Chronic L1 superior endplate compression fracture is new since
8233. No complicating features.

## 2023-02-01 ENCOUNTER — Encounter: Payer: Self-pay | Admitting: Nurse Practitioner

## 2023-02-01 ENCOUNTER — Telehealth: Payer: Self-pay | Admitting: Family Medicine

## 2023-02-01 ENCOUNTER — Ambulatory Visit: Payer: Medicare Other | Admitting: Nurse Practitioner

## 2023-02-01 VITALS — BP 138/73 | HR 106 | Ht 67.0 in | Wt 207.2 lb

## 2023-02-01 DIAGNOSIS — Z7984 Long term (current) use of oral hypoglycemic drugs: Secondary | ICD-10-CM

## 2023-02-01 DIAGNOSIS — Z794 Long term (current) use of insulin: Secondary | ICD-10-CM

## 2023-02-01 DIAGNOSIS — E890 Postprocedural hypothyroidism: Secondary | ICD-10-CM

## 2023-02-01 DIAGNOSIS — E1165 Type 2 diabetes mellitus with hyperglycemia: Secondary | ICD-10-CM

## 2023-02-01 DIAGNOSIS — Z7985 Long-term (current) use of injectable non-insulin antidiabetic drugs: Secondary | ICD-10-CM | POA: Diagnosis not present

## 2023-02-01 DIAGNOSIS — I10 Essential (primary) hypertension: Secondary | ICD-10-CM | POA: Diagnosis not present

## 2023-02-01 DIAGNOSIS — E782 Mixed hyperlipidemia: Secondary | ICD-10-CM

## 2023-02-01 LAB — POCT GLYCOSYLATED HEMOGLOBIN (HGB A1C): Hemoglobin A1C: 8.1 % — AB (ref 4.0–5.6)

## 2023-02-01 MED ORDER — OZEMPIC (1 MG/DOSE) 4 MG/3ML ~~LOC~~ SOPN: 1.0000 mg | PEN_INJECTOR | SUBCUTANEOUS | 1 refills | Status: DC

## 2023-02-01 NOTE — Telephone Encounter (Signed)
Handicap form  Noted copied Sleeved (put in provider box) Call patient [phone] when ready

## 2023-02-01 NOTE — Progress Notes (Signed)
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    Knee pain    Obesity    Past Surgical History:  Procedure Laterality Date   ABDOMINAL HYSTERECTOMY  03/02/1968   CATARACT EXTRACTION W/PHACO Left 03/09/2019   Procedure: CATARACT EXTRACTION PHACO AND INTRAOCULAR LENS PLACEMENT (IOC);  Surgeon: Fabio Pierce, MD;  Location: [facility];  Service: Ophthalmology;  Laterality: Left;  CDE: 9.08   CATARACT EXTRACTION W/PHACO Right 03/27/2019   Procedure: CATARACT EXTRACTION PHACO AND INTRAOCULAR LENS PLACEMENT RIGHT EYE;  Surgeon: Fabio Pierce, MD;  Location: [facility];  Service: Ophthalmology;  Laterality: Right;  CDE: 7.47   CHOLECYSTECTOMY  OCT 2010 BZ BILIARY DYSKINESIA   CHRONIC CHOLECYSTITIS   COLONOSCOPY  MAY 2009 SCREENING   [facility] TICS, [facility]   COLONOSCOPY N/A 08/11/2017   Procedure: COLONOSCOPY;  Surgeon: West Bali, MD;  [facility]: [facility];  Service: Endoscopy;  Laterality: N/A;  9:30am   ESOPHAGOGASTRODUODENOSCOPY  10/03/2010   Procedure: ESOPHAGOGASTRODUODENOSCOPY (EGD);  Surgeon: Arlyce Harman, MD;  Location: [facility];  Service: Endoscopy;  Laterality: N/A;   ESOPHAGOGASTRODUODENOSCOPY  12/19/2002   RMR: Normal esophagus/Normal stomach/ Duodenum large bulbar diverticulum, 1 cm bulbar ulcer with surrounding/ inflammation as described above. Normal D2   FOOT SURGERY Left 01/29/2021   POLYPECTOMY  08/11/2017   Procedure: POLYPECTOMY;  Surgeon: West Bali, MD;  Location: [facility];  Service: Endoscopy;;  cecal     SAVORY DILATION  10/03/2010   Procedure: SAVORY DILATION;  Surgeon: Arlyce Harman, MD;  Location: [facility];  Service: Endoscopy;;   TOTAL ABDOMINAL HYSTERECTOMY  03/02/1968   TOTAL KNEE ARTHROPLASTY Left 12/21/2017   Procedure: LEFT TOTAL KNEE ARTHROPLASTY;  Surgeon: Durene Romans, MD;  Location: [facility];  Service: Orthopedics;  Laterality: Left;  70 mins block   UPPER GASTROINTESTINAL ENDOSCOPY  OCT 2004 RMR   DUO TIC, & ULCER   Social History   Socioeconomic History   Marital status: Single    Spouse name: Not on file   Number of children: 3   Years of education: 10   Highest education level: 10th grade  Occupational History   Occupation: [employer]-OR   Occupation: Retired   Tobacco Use   Smoking status: Former    Current packs/day: 0.00    Average packs/day: 0.3 packs/day for 10.0 years (2.5 ttl pk-yrs)    Types: Cigarettes    Start date: 02/21/1975    Quit date: 02/20/1985    Years since quitting: 37.9   Smokeless tobacco: Never  Vaping Use   Vaping status: Never Used  Substance and Sexual Activity   Alcohol use: No    Comment: stopped drinking 1998   Drug use: No   Sexual activity: Not Currently  Other Topics Concern   Not on file  Social History Narrative   Lives alone and 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   Social Determinants of Corporate investment banker  Strain: Low Risk  (05/14/2022)   Overall Financial Resource Strain (CARDIA)    Difficulty of Paying Living Expenses: Not hard at all  Food Insecurity: No Food Insecurity (05/14/2022)   Hunger Vital Sign    Worried About Running Out of Food in the Last Year: Never true    Ran Out of Food in the Last Year: Never true  Transportation Needs: Unmet Transportation Needs (04/15/2022)   PRAPARE - Transportation    Lack of Transportation (Medical): Yes    Lack of Transportation (Non-Medical): Yes  Physical Activity: Inactive (03/09/2022)   Exercise Vital Sign    Days of Exercise per Week: 0 days    Minutes  of Exercise per Session: 0 min  Stress: No Stress Concern Present (05/14/2022)   [employer] of Occupational Health - Occupational Stress Questionnaire    Feeling of Stress : Not at all  Social Connections: Moderately Isolated (05/14/2022)   Social Connection and Isolation Panel [NHANES]    Frequency of Communication with Friends and Family: Three times a week    Frequency of Social Gatherings with Friends and Family: Once a week    Attends Religious Services: Never    Database administrator or Organizations: Yes    Attends Banker Meetings: 1 to 4 times per year    Marital Status: Never married   Outpatient Encounter Medications as of 02/01/2023  Medication Sig   alendronate (FOSAMAX) 70 MG tablet TAKE 1 TABLET BY MOUTH WEEKLY  WITH 8 OZ OF PLAIN WATER 30  MINUTES BEFORE FIRST FOOD, DRINK OR MEDS. STAY UPRIGHT FOR 30  MINS   amLODipine-benazepril (LOTREL) 5-40 MG capsule TAKE 1 CAPSULE BY MOUTH DAILY   aspirin EC 81 MG tablet Take 81 mg by mouth daily.   Blood Glucose Monitoring Suppl (ONE TOUCH ULTRA 2) w/Device KIT Twice daily testing dx e11.65   Calcium Carb-Cholecalciferol (CALCIUM 600/VITAMIN D3 PO) Take 2 tablets by mouth daily.   hydrochlorothiazide (HYDRODIURIL) 25 MG tablet TAKE 1 TABLET BY MOUTH DAILY   insulin degludec (TRESIBA FLEXTOUCH) 100 UNIT/ML FlexTouch Pen Inject 60 Units into the skin at bedtime.   Insulin Pen Needle (B-D UF III MINI PEN NEEDLES) 31G X 5 MM MISC USE FOR TWICE DAILY INJECTIONS  OF HUMALOG   Lancets (ONETOUCH DELICA PLUS LANCET33G) MISC USE TWICE DAILY TO CHECK BLOOD  SUGAR   levothyroxine (SYNTHROID) 88 MCG tablet TAKE 1 TABLET BY MOUTH DAILY  BEFORE BREAKFAST   Multiple Vitamin (MULTIVITAMIN) capsule Take 1 capsule by mouth daily.   omeprazole (PRILOSEC) 20 MG capsule TAKE 1 CAPSULE BY MOUTH IN THE  MORNING   ONETOUCH ULTRA test strip USE TO CHECK BLOOD SUGAR TWICE  DAILY   polyethylene glycol (MIRALAX / GLYCOLAX) 17 g packet Take 17 g  by mouth as needed.   psyllium (METAMUCIL) 58.6 % packet Take 1 packet by mouth as needed.   rosuvastatin (CRESTOR) 5 MG tablet Take one tablet by mouth five days per week, Monday though Friday   UNABLE TO FIND Diabetic shoes x 1 pair, Inserts x 3 pair DX E11.9   [DISCONTINUED] glipiZIDE (GLUCOTROL XL) 2.5 MG 24 hr tablet TAKE 1 TABLET BY MOUTH DAILY  WITH BREAKFAST   [DISCONTINUED] Semaglutide, 1 MG/DOSE, (OZEMPIC, 1 MG/DOSE,) 4 MG/3ML SOPN INJECT SUBCUTANEOUSLY 1 MG EVERY WEEK   Semaglutide, 1 MG/DOSE, (OZEMPIC, 1 MG/DOSE,) 4 MG/3ML SOPN Inject 1 mg into the skin once a week.   No facility-administered encounter medications on file as of 02/01/2023.  ALLERGIES: Allergies  Allergen Reactions   Pravastatin Other (See Comments)    PT is currently taking but states she cramps   Simvastatin Other (See Comments)    Muscle cramps Other reaction(s): Not available   VACCINATION STATUS: Immunization History  Administered Date(s) Administered   Fluad Quad(high Dose 65+) 11/23/2018, 12/21/2019, 12/10/2020, 11/12/2021   Influenza,inj,Quad PF,6+ Mos 11/16/2012, 11/15/2013, 11/14/2014, 11/14/2015, 11/03/2016, 11/09/2017   Influenza,inj,quad, With Preservative 11/02/2016   Moderna SARS-COV2 Booster Vaccination 07/24/2020, 05/26/2021   Moderna Sars-Covid-2 Vaccination 04/27/2019, 05/26/2019, 01/08/2020   Pneumococcal Conjugate-13 10/11/2013   Pneumococcal Polysaccharide-23 10/07/2011   Tdap 12/16/2020   Zoster Recombinant(Shingrix) 12/16/2020   Zoster, Live 04/05/2006   Zoster, Unspecified 06/16/2021    Hypertension This is a chronic problem. The current episode started more than 1 year ago. The problem has been resolved since onset. The problem is controlled. Pertinent negatives include no blurred vision, headaches, palpitations, shortness of breath or sweats. Agents associated with hypertension include thyroid hormones. Risk factors for coronary artery disease include diabetes mellitus,  dyslipidemia, obesity, sedentary lifestyle and smoking/tobacco exposure. Past treatments include ACE inhibitors, diuretics and calcium channel blockers. The current treatment provides mild improvement. There are no compliance problems.  Hypertensive end-organ damage includes kidney disease. Identifiable causes of hypertension include chronic renal disease and a thyroid problem.  Diabetes She presents for her follow-up diabetic visit. She has type 2 diabetes mellitus. Onset time: She was diagnosed at approximate age of 40 years. Her disease course has been stable. Pertinent negatives for hypoglycemia include no confusion, headaches, nervousness/anxiousness, sweats or tremors. Pertinent negatives for diabetes include no blurred vision, no fatigue, no foot paresthesias, no polydipsia, no polyphagia, no polyuria and no weight loss. There are no hypoglycemic complications. Symptoms are stable. Diabetic complications include nephropathy. Risk factors for coronary artery disease include dyslipidemia, diabetes mellitus, obesity, hypertension, sedentary lifestyle, post-menopausal and tobacco exposure. Current diabetic treatment includes insulin injections and oral agent (monotherapy) (and Ozempic). She is compliant with treatment most of the time. Her weight is fluctuating minimally. She is following a generally unhealthy diet. When asked about meal planning, she reported none. She has not had a previous visit with a dietitian. She rarely participates in exercise. Her home blood glucose trend is decreasing steadily. Her breakfast blood glucose range is generally 110-130 mg/dl. Her dinner blood glucose range is generally 140-180 mg/dl. (She presents today with her meter and logs showing improving glycemic profile.  Her POCT A1c today is 8.1%, essentially unchanged from previous visit.  Analysis of her meter shows 7-day average of 125, 14-day average of 155, 30-day average of 162.  She denies any significant hypoglycemia on  her readings but does report she has some symptoms of such (did not check glucose during that time).) An ACE inhibitor/angiotensin II receptor blocker is being taken. She does not see a podiatrist.Eye exam is current.  Thyroid Problem Presents for follow-up (-She is also status post radioactive iodine treatment for toxic nodule on October 17, 2010.  She has had  history of multinodular goiter status post fine-needle aspiration with benign findings.) visit. Patient reports no anxiety, cold intolerance, constipation, depressed mood, diarrhea, fatigue, heat intolerance, leg swelling, palpitations, tremors, weight gain or weight loss. The symptoms have been stable.     Review of systems  Constitutional: + stable body weight,  current Body mass index is 32.45 kg/m. , no fatigue, no subjective hyperthermia, no subjective hypothermia Eyes: no blurry vision, no xerophthalmia ENT: no sore throat, no nodules palpated in throat,  no dysphagia/odynophagia, no hoarseness Cardiovascular: no chest pain, no shortness of breath, no palpitations, no leg swelling Respiratory: no cough, no shortness of breath Gastrointestinal: no nausea/vomiting/diarrhea Musculoskeletal: no muscle/joint aches Skin: no rashes, no hyperemia Neurological: no tremors, no numbness, no tingling, no dizziness Psychiatric: no depression, no anxiety    Objective:    BP 138/73 (BP Location: Left Arm, Patient Position: Sitting, Cuff Size: Large)   Pulse (!) 106   Ht 5\' 7"  (1.702 m)   Wt 207 lb 3.2 oz (94 kg)   BMI 32.45 kg/m   Wt Readings from Last 3 Encounters:  02/01/23 207 lb 3.2 oz (94 kg)  09/28/22 208 lb 3.2 oz (94.4 kg)  09/15/22 210 lb (95.3 kg)    BP Readings from Last 3 Encounters:  02/01/23 138/73  09/28/22 101/65  09/15/22 120/74    Physical Exam- Limited  Constitutional:  Body mass index is 32.45 kg/m. , not in acute distress, normal state of mind, ? Memory deficits Eyes:  EOMI, no  exophthalmos Musculoskeletal: no gross deformities, strength intact in all four extremities, no gross restriction of joint movements Skin:  no rashes, no hyperemia Neurological: no tremor with outstretched hands    CMP     Component Value Date/Time   NA 139 09/21/2022 0932   K 4.8 09/21/2022 0932   CL 99 09/21/2022 0932   CO2 17 (L) 09/21/2022 0932   GLUCOSE 209 (H) 09/21/2022 0932   GLUCOSE 395 (H) 12/28/2020 2201   BUN 22 09/21/2022 0932   CREATININE 1.13 (H) 09/21/2022 0932   CREATININE 0.96 (H) 03/12/2016 0925   CALCIUM 10.0 09/21/2022 0932   PROT 8.1 09/21/2022 0932   ALBUMIN 4.7 09/21/2022 0932   AST 26 09/21/2022 0932   ALT 20 09/21/2022 0932   ALKPHOS 104 09/21/2022 0932   BILITOT 0.3 09/21/2022 0932   GFRNONAA 40 (L) 12/28/2020 2201   GFRNONAA 60 03/12/2016 0925   GFRAA 48 (L) 04/11/2020 1007   GFRAA 69 03/12/2016 0925     Diabetic Labs (most recent): Lab Results  Component Value Date   HGBA1C 8.1 (A) 02/01/2023   HGBA1C 8.0 (A) 09/28/2022   HGBA1C 10.5 (A) 06/17/2022   MICROALBUR 10 04/19/2020   MICROALBUR 0.4 03/12/2016   MICROALBUR 0.2 02/19/2014     Lipid Panel ( most recent) Lipid Panel     Component Value Date/Time   CHOL 188 09/21/2022 0938   TRIG 114 09/21/2022 0938   HDL 57 09/21/2022 0938   CHOLHDL 3.3 09/21/2022 0938   CHOLHDL 2.2 01/11/2016 0916   VLDL 11 01/11/2016 0916   LDLCALC 111 (H) 09/21/2022 0938    Assessment & Plan:   1) Uncontrolled type 2 diabetes mellitus with stage 3a chronic kidney disease, with long-term current use of insulin (HCC)  She presents today with her meter and logs showing improving glycemic profile.  Her POCT A1c today is 8.1%, essentially unchanged from previous visit.  Analysis of her meter shows 7-day average of 125, 14-day average of 155, 30-day average of 162.  She denies any significant hypoglycemia on her readings but does report she has some symptoms of such (did not check glucose during that  time).  - Nutritional counseling repeated at each appointment due to patients tendency to fall back in to old habits.  - The patient admits there is a room for improvement in their diet and drink choices. -  Suggestion is made for the patient to avoid simple carbohydrates from their diet including Cakes, Sweet  Desserts / Pastries, Ice Cream, Soda (diet and regular), Sweet Tea, Candies, Chips, Cookies, Sweet Pastries, Store Bought Juices, Alcohol in Excess of 1-2 drinks a day, Artificial Sweeteners, Coffee Creamer, and "Sugar-free" Products. This will help patient to have stable blood glucose profile and potentially avoid unintended weight gain.   - I encouraged the patient to switch to unprocessed or minimally processed complex starch and increased protein intake (animal or plant source), fruits, and vegetables.   - Patient is advised to stick to a routine mealtimes to eat 3 meals a day and avoid unnecessary snacks (to snack only to correct hypoglycemia).  -She can continue Tresiba 60 units SQ nightly and Ozempic 1 mg SQ weekly but will stop her Glipizide today for her reports of hypoglycemia symptoms.  She notes her stomach has felt much better since the discontinuation of Metformin and wishes to stay off of it.   -She is advised to continue monitoring blood glucose twice daily, before breakfast and before bed, and to call the clinic if she has readings less than 70 or above 200 for 3 tests in a row.  She does not want a CGM, has tried it and does not like it.  2) Hypothyroidism- s/p RAI -There are no recent TFTs to review.  She is advised to continue Levothyroxine 88 mcg po daily before breakfast. Will recheck prior to next visit and adjust dose if necessary.   - We discussed about the correct intake of her thyroid hormone, on empty stomach at fasting, with water, separated by at least 30 minutes from breakfast and other medications,  and separated by more than 4 hours from calcium, iron,  multivitamins, acid reflux medications (PPIs). -Patient is made aware of the fact that thyroid hormone replacement is needed for life, dose to be adjusted by periodic monitoring of thyroid function tests.  3) Weight management:  Her Body mass index is 32.45 kg/m.-a candidate for modest weight loss.  Patient specific carbs restrictions and exercise regimen discussed with her.  4) Blood pressure/ Hypertension: -Her blood pressure is controlled to target.  She is advised to continue Amlodipine Benazepril 5-40 mg po daily and HCTZ 25 mg po daily.    5) Hyperlipidemia: Her recent lipid panel from 09/21/22 showed uncontrolled LDL of 111.  She is advised to continue Crestor 5 mg po daily at bedtime (taking it 5 days a week).  Side effects and precautions discussed with her.  - I advised patient to maintain close follow up with Kerri Perches, MD for diabetes and all other primary care needs.     I spent  46  minutes in the care of the patient today including review of labs from CMP, Lipids, Thyroid Function, Hematology (current and previous including abstractions from other facilities); face-to-face time discussing  her blood glucose readings/logs, discussing hypoglycemia and hyperglycemia episodes and symptoms, medications doses, her options of short and long term treatment based on the latest standards of care / guidelines;  discussion about incorporating lifestyle medicine;  and documenting the encounter. Risk reduction counseling performed per USPSTF guidelines to reduce obesity and cardiovascular risk factors.     Please refer to Patient Instructions for Blood Glucose Monitoring and Insulin/Medications Dosing Guide"  in media tab for additional information. Please  also refer to " Patient Self Inventory" in the Media  tab for reviewed elements of pertinent patient history.  Stacey Hanson participated in the discussions, expressed understanding, and voiced agreement with the above plans.  All  questions were  answered to her satisfaction. she is encouraged to contact clinic should she have any questions or concerns prior to her return visit.    Follow up plan: Return in about 4 months (around 06/02/2023) for Diabetes F/U with A1c in office, Thyroid follow up, Previsit labs, Bring meter and logs.   Ronny Bacon, [facility] [address] [place], [place] [address] Phone: [phone] Fax: [phone]  02/01/2023, 3:02 PM

## 2023-02-02 ENCOUNTER — Ambulatory Visit: Payer: Self-pay | Admitting: *Deleted

## 2023-02-02 NOTE — Patient Instructions (Signed)
Visit Information  Thank you for taking time to visit with me today. Please don't hesitate to contact me if I can be of assistance to you.   Following are the goals we discussed today:   Goals Addressed   None     Our next appointment is by telephone on 03/24/23 at 3 pm  Please call the care guide team at [phone] if you need to cancel or reschedule your appointment.   If you are experiencing a Mental Health or Behavioral Health Crisis or need someone to talk to, please call the Suicide and Crisis Lifeline: 988 call the [place] National Suicide Prevention Lifeline: 763-093-5966 or TTY: 832 026 4937 TTY 520-126-2292) to talk to a trained counselor call 1-800-273-TALK (toll free, 24 hour hotline) call the [facility]: 207-817-7286 call 911   No computer access, no preference for copy of AVS      The patient has been provided with contact information for the care management team and has been advised to call with any health related questions or concerns.    Evonne Rinks L. Noelle Penner, RN, BSN, CCM [place]  [facility], [facility] RN Care Manager Direct Dial: [phone]  Fax: [phone] Mailing Address: [address]  [place] [address] Website: [email or web address]

## 2023-02-02 NOTE — Patient Outreach (Signed)
  Care Coordination   Follow Up Visit Note   03/24/2023 Name: Stacey Hanson MRN: [id number] DOB: 1945/04/24  Stacey Hanson is a 77 y.o. year old female who sees Kerri Perches, MD for primary care. I spoke with  [name] by phone today.  What matters to the patients health and wellness today?  Doing well Diabetes- A1c- 8.1% , taken off one pill Dentist taken off medicine fosamax (had been taking it every Sunday morning) prior to oral surgery/abscess of tooth    [facility] [place] [address] [place], [place] [address] [phone] Fax: [phone] [email or web address]  Goals Addressed               This Visit's Progress     Patient Stated     Home management of medications prior to dental services, diabetic shoes, weight loss/ozempic, Diabetes, Hypothyroidism, preventive care- RN CM services (pt-stated)   On track     Interventions Today    Flowsheet Row Most Recent Value  Chronic Disease   Chronic disease during today's visit Diabetes  [dental surgery pending, A1c down to 8.1 was in 10's]  General Interventions   General Interventions Discussed/Reviewed General Interventions Reviewed, Doctor Visits  Doctor Visits Discussed/Reviewed Doctor Visits Reviewed, PCP, Specialist  PCP/Specialist Visits Compliance with follow-up visit  Education Interventions   Education Provided Provided Education  Provided Verbal Education On Medication, Blood Sugar Monitoring, Community Resources  Pharmacy Interventions   Pharmacy Dicussed/Reviewed Pharmacy Topics Reviewed, Medications and their functions, Affording Medications              SDOH assessments and interventions completed:  No     Care Coordination Interventions:  Yes, provided   Follow up plan: Follow up call scheduled for 03/24/23    Encounter Outcome:  Patient Visit Completed   Cala Bradford L. Noelle Penner, RN, BSN, [facility]  [employer] Care Management Coordinator  [phone]  Fax: [phone]

## 2023-02-04 NOTE — Telephone Encounter (Signed)
Patient picked up form

## 2023-02-04 NOTE — Telephone Encounter (Signed)
Called patient form ready for pick up 

## 2023-02-17 DIAGNOSIS — M79672 Pain in left foot: Secondary | ICD-10-CM | POA: Diagnosis not present

## 2023-02-17 DIAGNOSIS — M79675 Pain in left toe(s): Secondary | ICD-10-CM | POA: Diagnosis not present

## 2023-02-17 DIAGNOSIS — E1151 Type 2 diabetes mellitus with diabetic peripheral angiopathy without gangrene: Secondary | ICD-10-CM | POA: Diagnosis not present

## 2023-02-17 DIAGNOSIS — M79674 Pain in right toe(s): Secondary | ICD-10-CM | POA: Diagnosis not present

## 2023-02-17 DIAGNOSIS — M79671 Pain in right foot: Secondary | ICD-10-CM | POA: Diagnosis not present

## 2023-02-20 ENCOUNTER — Other Ambulatory Visit: Payer: Self-pay | Admitting: Nurse Practitioner

## 2023-02-22 ENCOUNTER — Other Ambulatory Visit: Payer: Self-pay | Admitting: Family Medicine

## 2023-02-22 DIAGNOSIS — E1165 Type 2 diabetes mellitus with hyperglycemia: Secondary | ICD-10-CM

## 2023-03-09 ENCOUNTER — Other Ambulatory Visit: Payer: Self-pay | Admitting: Nurse Practitioner

## 2023-03-18 DIAGNOSIS — E1165 Type 2 diabetes mellitus with hyperglycemia: Secondary | ICD-10-CM | POA: Diagnosis not present

## 2023-03-18 DIAGNOSIS — E782 Mixed hyperlipidemia: Secondary | ICD-10-CM | POA: Diagnosis not present

## 2023-03-18 DIAGNOSIS — I10 Essential (primary) hypertension: Secondary | ICD-10-CM | POA: Diagnosis not present

## 2023-03-18 DIAGNOSIS — E890 Postprocedural hypothyroidism: Secondary | ICD-10-CM | POA: Diagnosis not present

## 2023-03-19 ENCOUNTER — Other Ambulatory Visit: Payer: Self-pay | Admitting: Nurse Practitioner

## 2023-03-19 ENCOUNTER — Ambulatory Visit (INDEPENDENT_AMBULATORY_CARE_PROVIDER_SITE_OTHER): Payer: Medicare Other | Admitting: Family Medicine

## 2023-03-19 ENCOUNTER — Encounter: Payer: Self-pay | Admitting: Family Medicine

## 2023-03-19 ENCOUNTER — Telehealth: Payer: Self-pay | Admitting: Nurse Practitioner

## 2023-03-19 VITALS — BP 128/69 | HR 101 | Ht 67.0 in | Wt 203.0 lb

## 2023-03-19 DIAGNOSIS — E890 Postprocedural hypothyroidism: Secondary | ICD-10-CM

## 2023-03-19 DIAGNOSIS — H6123 Impacted cerumen, bilateral: Secondary | ICD-10-CM | POA: Diagnosis not present

## 2023-03-19 DIAGNOSIS — E559 Vitamin D deficiency, unspecified: Secondary | ICD-10-CM

## 2023-03-19 DIAGNOSIS — E782 Mixed hyperlipidemia: Secondary | ICD-10-CM | POA: Diagnosis not present

## 2023-03-19 DIAGNOSIS — E1159 Type 2 diabetes mellitus with other circulatory complications: Secondary | ICD-10-CM | POA: Diagnosis not present

## 2023-03-19 DIAGNOSIS — H9193 Unspecified hearing loss, bilateral: Secondary | ICD-10-CM

## 2023-03-19 DIAGNOSIS — R32 Unspecified urinary incontinence: Secondary | ICD-10-CM

## 2023-03-19 DIAGNOSIS — E1165 Type 2 diabetes mellitus with hyperglycemia: Secondary | ICD-10-CM

## 2023-03-19 DIAGNOSIS — Z0001 Encounter for general adult medical examination with abnormal findings: Secondary | ICD-10-CM | POA: Diagnosis not present

## 2023-03-19 DIAGNOSIS — I10 Essential (primary) hypertension: Secondary | ICD-10-CM

## 2023-03-19 LAB — COMPREHENSIVE METABOLIC PANEL
ALT: 24 [IU]/L (ref 0–32)
AST: 26 [IU]/L (ref 0–40)
Albumin: 4.3 g/dL (ref 3.8–4.8)
Alkaline Phosphatase: 92 [IU]/L (ref 44–121)
BUN/Creatinine Ratio: 21 (ref 12–28)
BUN: 22 mg/dL (ref 8–27)
Bilirubin Total: 0.3 mg/dL (ref 0.0–1.2)
CO2: 23 mmol/L (ref 20–29)
Calcium: 9.3 mg/dL (ref 8.7–10.3)
Chloride: 97 mmol/L (ref 96–106)
Creatinine, Ser: 1.07 mg/dL — ABNORMAL HIGH (ref 0.57–1.00)
Globulin, Total: 3 g/dL (ref 1.5–4.5)
Glucose: 166 mg/dL — ABNORMAL HIGH (ref 70–99)
Potassium: 4.3 mmol/L (ref 3.5–5.2)
Sodium: 138 mmol/L (ref 134–144)
Total Protein: 7.3 g/dL (ref 6.0–8.5)
eGFR: 53 mL/min/{1.73_m2} — ABNORMAL LOW (ref >59)

## 2023-03-19 LAB — LIPID PANEL
Chol/HDL Ratio: 3 {ratio} (ref 0.0–4.4)
Cholesterol, Total: 169 mg/dL (ref 100–199)
HDL: 57 mg/dL (ref >39)
LDL Chol Calc (NIH): 97 mg/dL (ref 0–99)
Triglycerides: 77 mg/dL (ref 0–149)
VLDL Cholesterol Cal: 15 mg/dL (ref 5–40)

## 2023-03-19 LAB — HEPATIC FUNCTION PANEL
ALT: 24 [IU]/L (ref 0–32)
AST: 30 [IU]/L (ref 0–40)
Albumin: 4.5 g/dL (ref 3.8–4.8)
Alkaline Phosphatase: 97 [IU]/L (ref 44–121)
Bilirubin Total: 0.4 mg/dL (ref 0.0–1.2)
Bilirubin, Direct: 0.13 mg/dL (ref 0.00–0.40)
Total Protein: 7.6 g/dL (ref 6.0–8.5)

## 2023-03-19 LAB — T4, FREE: Free T4: 1.32 ng/dL (ref 0.82–1.77)

## 2023-03-19 LAB — TSH: TSH: 1.24 u[IU]/mL (ref 0.450–4.500)

## 2023-03-19 NOTE — Telephone Encounter (Signed)
Yes, but not all of them will need repeating.  I have entered the orders.

## 2023-03-19 NOTE — Patient Instructions (Addendum)
F/u in  6 months, call if you need me sooner  Bilateral ear flush today by nursing  Urine ACR today from office  Nurse pls follow up with opt re diabetic shoes  CBC, fasting cmp and EGFR, lipid, and vit D 3 to 5 days befoore 6 month follow up  Covid vaccine recommended as a booster , you may get thi at your pharmacy  Keep up great health habits

## 2023-03-19 NOTE — Progress Notes (Addendum)
    Stacey Hanson     MRN: [id number]      DOB: 1946/02/25  Chief Complaint  Patient presents with   Annual Exam    CPE    HPI: Patient is in for annual physical exam. Bilateral hearing loss with wax Stool hard then squirts forever, does not want/ refuses diagnostic colonoscopy, will start by sending off cologuard which was requested long ago Immunization is reviewed , and  updated covid is recommended C/o accidental wetting om herself when she coughs, sneezes or laughs, reports some urge incontinence also and at night needs to wear a pad also to prevent bed wetting, uses 2 /day, does not need incontinence underwear I or chux  PE: BP 128/69 (BP Location: Right Arm, Patient Position: Sitting, Cuff Size: Large)   Pulse (!) 101   Ht 5\' 7"  (1.702 m)   Wt 203 lb 0.6 oz (92.1 kg)   SpO2 96%   BMI 31.80 kg/m   Pleasant  female, alert and oriented x 3, in no cardio-pulmonary distress. Afebrile. HEENT No facial trauma or asymetry. Sinuses non tender. Bilateral cerumen impaction Extra occullar muscles intact.. External ears normal, . Neck: supple, no adenopathy,JVD or thyromegaly.No bruits.  Chest: Clear to ascultation bilaterally.No crackles or wheezes. Non tender to palpation    Cardiovascular system; Heart sounds normal,  S1 and  S2 ,no S3.  No murmur, or thrill. Apical beat not displaced Peripheral pulses normal.  Abdomen: Soft, non tender, Uterus normal size, no adnexal masses, no cervical motion or adnexal tenderness.   Musculoskeletal exam: Decreased  ROM of spine, hips , shoulders and knees.  deformity ,swelling or crepitus noted. No muscle wasting or atrophy.   Neurologic: Cranial nerves 2 to 12 intact. Power, tone ,sensation and reflexes normal throughout. disturbance in gait. No tremor.  Skin: Intact, no ulceration, erythema , scaling or rash noted. Pigmentation normal throughout  Psych; Normal mood and affect. Judgement and concentration  normal   Assessment & Plan:  Encounter for Medicare annual examination with abnormal findings Annual exam as documented. Counseling done  re healthy lifestyle involving commitment to 150 minutes exercise per week, heart healthy diet, and attaining healthy weight.The importance of adequate sleep also discussed. Regular seat belt use and home safety, is also discussed. Changes in health habits are decided on by the patient with goals and time frames  set for achieving them. Immunization and cancer screening needs are specifically addressed at this visit.   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

## 2023-03-19 NOTE — Telephone Encounter (Signed)
Pt did her labs and appt is not until April. Do you want repeat labs?

## 2023-03-21 LAB — MICROALBUMIN / CREATININE URINE RATIO
Creatinine, Urine: 182.1 mg/dL
Microalb/Creat Ratio: 7 mg/g{creat} (ref 0–29)
Microalbumin, Urine: 12.6 ug/mL

## 2023-03-23 DIAGNOSIS — H6123 Impacted cerumen, bilateral: Secondary | ICD-10-CM | POA: Insufficient documentation

## 2023-03-23 DIAGNOSIS — H9190 Unspecified hearing loss, unspecified ear: Secondary | ICD-10-CM | POA: Insufficient documentation

## 2023-03-23 NOTE — Assessment & Plan Note (Signed)
Unsuccesful attempt at flush by nursing staff at visit, refer ENT

## 2023-03-23 NOTE — Assessment & Plan Note (Addendum)
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

## 2023-03-23 NOTE — Assessment & Plan Note (Signed)

## 2023-03-24 ENCOUNTER — Ambulatory Visit: Payer: Self-pay | Admitting: *Deleted

## 2023-03-24 ENCOUNTER — Ambulatory Visit (INDEPENDENT_AMBULATORY_CARE_PROVIDER_SITE_OTHER): Payer: Medicare Other

## 2023-03-24 VITALS — Ht 67.0 in | Wt 203.0 lb

## 2023-03-24 DIAGNOSIS — Z0000 Encounter for general adult medical examination without abnormal findings: Secondary | ICD-10-CM | POA: Diagnosis not present

## 2023-03-24 NOTE — Patient Instructions (Signed)
Stacey Hanson , Thank you for taking time to come for your Medicare Wellness Visit. I appreciate your ongoing commitment to your health goals. Please review the following plan we discussed and let me know if I can assist you in the future.   Referrals/Orders/Follow-Ups/Clinician Recommendations:  Next Medicare Annual Wellness Visit:March 28, 2024 at 1:50 pm telephone visit.      This is a list of the screening recommended for you and due dates:  Health Maintenance  Topic Date Due   COVID-19 Vaccine (7 - 2024-25 season) 01/18/2023   Eye exam for diabetics  04/28/2023   DEXA scan (bone density measurement)  05/23/2023   Hemoglobin A1C  08/02/2023   Complete foot exam   09/15/2023   Mammogram  12/02/2023   Yearly kidney function blood test for diabetes  03/17/2024   Yearly kidney health urinalysis for diabetes  03/18/2024   Medicare Annual Wellness Visit  03/23/2024   DTaP/Tdap/Td vaccine (2 - Td or Tdap) 12/17/2030   Pneumonia Vaccine  Completed   Flu Shot  Completed   Hepatitis C Screening  Completed   Zoster (Shingles) Vaccine  Completed   HPV Vaccine  Aged Out   Colon Cancer Screening  Discontinued    Advanced directives: (Provided) Advance directive discussed with you today. I have provided a copy for you to complete at home and have notarized. Once this is complete, please bring a copy in to our office so we can scan it into your chart.   Next Medicare Annual Wellness Visit scheduled for next year: yes  Preventive Care 68 Years and Older, Female Preventive care refers to lifestyle choices and visits with your health care provider that can promote health and wellness. Preventive care visits are also called wellness exams. What can I expect for my preventive care visit? Counseling Your health care provider may ask you questions about your: Medical history, including: Past medical problems. Family medical history. Pregnancy and menstrual history. History of falls. Current  health, including: Memory and ability to understand (cognition). Emotional well-being. Home life and relationship well-being. Sexual activity and sexual health. Lifestyle, including: Alcohol, nicotine or tobacco, and drug use. Access to firearms. Diet, exercise, and sleep habits. Work and work Astronomer. Sunscreen use. Safety issues such as seatbelt and bike helmet use. Physical exam Your health care provider will check your: Height and weight. These may be used to calculate your BMI (body mass index). BMI is a measurement that tells if you are at a healthy weight. Waist circumference. This measures the distance around your waistline. This measurement also tells if you are at a healthy weight and may help predict your risk of certain diseases, such as type 2 diabetes and high blood pressure. Heart rate and blood pressure. Body temperature. Skin for abnormal spots. What immunizations do I need?  Vaccines are usually given at various ages, according to a schedule. Your health care provider will recommend vaccines for you based on your age, medical history, and lifestyle or other factors, such as travel or where you work. What tests do I need? Screening Your health care provider may recommend screening tests for certain conditions. This may include: Lipid and cholesterol levels. Hepatitis C test. Hepatitis B test. HIV (human immunodeficiency virus) test. STI (sexually transmitted infection) testing, if you are at risk. Lung cancer screening. Colorectal cancer screening. Diabetes screening. This is done by checking your blood sugar (glucose) after you have not eaten for a while (fasting). Mammogram. Talk with your health care provider about  how often you should have regular mammograms. BRCA-related cancer screening. This may be done if you have a family history of breast, ovarian, tubal, or peritoneal cancers. Bone density scan. This is done to screen for osteoporosis. Talk with  your health care provider about your test results, treatment options, and if necessary, the need for more tests. Follow these instructions at home: Eating and drinking  Eat a diet that includes fresh fruits and vegetables, whole grains, lean protein, and low-fat dairy products. Limit your intake of foods with high amounts of sugar, saturated fats, and salt. Take vitamin and mineral supplements as recommended by your health care provider. Do not drink alcohol if your health care provider tells you not to drink. If you drink alcohol: Limit how much you have to 0-1 drink a day. Know how much alcohol is in your drink. In the [place] one drink equals one 12 oz bottle of beer (355 mL), one 5 oz glass of wine (148 mL), or one 1 oz glass of hard liquor (44 mL). Lifestyle Brush your teeth every morning and night with fluoride toothpaste. Floss one time each day. Exercise for at least 30 minutes 5 or more days each week. Do not use any products that contain nicotine or tobacco. These products include cigarettes, chewing tobacco, and vaping devices, such as e-cigarettes. If you need help quitting, ask your health care provider. Do not use drugs. If you are sexually active, practice safe sex. Use a condom or other form of protection in order to prevent STIs. Take aspirin only as told by your health care provider. Make sure that you understand how much to take and what form to take. Work with your health care provider to find out whether it is safe and beneficial for you to take aspirin daily. Ask your health care provider if you need to take a cholesterol-lowering medicine (statin). Find healthy ways to manage stress, such as: Meditation, yoga, or listening to music. Journaling. Talking to a trusted person. Spending time with friends and family. Minimize exposure to UV radiation to reduce your risk of skin cancer. Safety Always wear your seat belt while driving or riding in a vehicle. Do not drive: If  you have been drinking alcohol. Do not ride with someone who has been drinking. When you are tired or distracted. While texting. If you have been using any mind-altering substances or drugs. Wear a helmet and other protective equipment during sports activities. If you have firearms in your house, make sure you follow all gun safety procedures. What's next? Visit your health care provider once a year for an annual wellness visit. Ask your health care provider how often you should have your eyes and teeth checked. Stay up to date on all vaccines. This information is not intended to replace advice given to you by your health care provider. Make sure you discuss any questions you have with your health care provider. Document Revised: 08/14/2020 Document Reviewed: 08/14/2020 Elsevier Patient Education  2024 [employer].  Understanding Your Risk for Falls Millions of people have serious injuries from falls each year. It is important to understand your risk of falling. Talk with your health care provider about your risk and what you can do to lower it. If you do have a serious fall, make sure to tell your provider. Falling once raises your risk of falling again. How can falls affect me? Serious injuries from falls are common. These include: Broken bones, such as hip fractures. Head injuries, such as  traumatic brain injuries (TBI) or concussions. A fear of falling can cause you to avoid activities and stay at home. This can make your muscles weaker and raise your risk for a fall. What can increase my risk? There are a number of risk factors that increase your risk for falling. The more risk factors you have, the higher your risk of falling. Serious injuries from a fall happen most often to people who are older than 78 years old. Teenagers and young adults ages 29-29 are also at higher risk. Common risk factors include: Weakness in the lower body. Being generally weak or confused due to long-term  (chronic) illness. Dizziness or balance problems. Poor vision. Medicines that cause dizziness or drowsiness. These may include: Medicines for your blood pressure, heart, anxiety, insomnia, or swelling (edema). Pain medicines. Muscle relaxants. Other risk factors include: Drinking alcohol. Having had a fall in the past. Having foot pain or wearing improper footwear. Working at a dangerous job. Having any of the following in your home: Tripping hazards, such as floor clutter or loose rugs. Poor lighting. Pets. Having dementia or memory loss. What actions can I take to lower my risk of falling?     Physical activity Stay physically fit. Do strength and balance exercises. Consider taking a regular class to build strength and balance. Yoga and tai chi are good options. Vision Have your eyes checked every year and your prescription for glasses or contacts updated as needed. Shoes and walking aids Wear non-skid shoes. Wear shoes that have rubber soles and low heels. Do not wear high heels. Do not walk around the house in socks or slippers. Use a cane or walker as told by your provider. Home safety Attach secure railings on both sides of your stairs. Install grab bars for your bathtub, shower, and toilet. Use a non-skid mat in your bathtub or shower. Attach bath mats securely with double-sided, non-slip rug tape. Use good lighting in all rooms. Keep a flashlight near your bed. Make sure there is a clear path from your bed to the bathroom. Use night-lights. Do not use throw rugs. Make sure all carpeting is taped or tacked down securely. Remove all clutter from walkways and stairways, including extension cords. Repair uneven or broken steps and floors. Avoid walking on icy or slippery surfaces. Walk on the grass instead of on icy or slick sidewalks. Use ice melter to get rid of ice on walkways in the winter. Use a cordless phone. Questions to ask your health care provider Can you help  me check my risk for a fall? Do any of my medicines make me more likely to fall? Should I take a vitamin D supplement? What exercises can I do to improve my strength and balance? Should I make an appointment to have my vision checked? Do I need a bone density test to check for weak bones (osteoporosis)? Would it help to use a cane or a walker? Where to find more information Centers for Disease Control and Prevention, [employer]: [employer]erProm[employer].no Community-Based Fall Prevention Programs: TonerPromos.no [employer] on Aging: BaseRingTones.pl Contact a health care provider if: You fall at home. You are afraid of falling at home. You feel weak, drowsy, or dizzy. This information is not intended to replace advice given to you by your health care provider. Make sure you discuss any questions you have with your health care provider. Document Revised: 10/20/2021 Document Reviewed: 10/20/2021 Elsevier Patient Education  2024 [employer].

## 2023-03-24 NOTE — Progress Notes (Signed)
Because this visit was a virtual/telehealth visit,  certain criteria was not obtained, such a blood pressure, CBG if applicable, and timed get up and go. Any medications not marked as "taking" were not mentioned during the medication reconciliation part of the visit. Any vitals not documented were not able to be obtained due to this being a telehealth visit or patient was unable to self-report a recent blood pressure reading due to a lack of equipment at home via telehealth. Vitals that have been documented are verbally provided by the patient.  Interactive audio and video telecommunications were attempted between this provider and patient, however failed, due to patient having technical difficulties OR patient did not have access to video capability.  We continued and completed visit with audio only.  Subjective:   Stacey Hanson is a 78 y.o. female who presents for Medicare Annual (Subsequent) preventive examination.  Visit Complete: Virtual I connected with  Stacey Hanson on 03/24/23 by a audio enabled telemedicine application and verified that I am speaking with the correct person using two identifiers.  Patient Location: Home  Provider Location: Office/Clinic  I discussed the limitations of evaluation and management by telemedicine. The patient expressed understanding and agreed to proceed.  Vital Signs: Because this visit was a virtual/telehealth visit, some criteria may be missing or patient reported. Any vitals not documented were not able to be obtained and vitals that have been documented are patient reported.  Patient Medicare AWV questionnaire was completed by the patient on na; I have confirmed that all information answered by patient is correct and no changes since this date.  Cardiac Risk Factors include: advanced age (>16men, >52 women);diabetes mellitus;dyslipidemia;hypertension;sedentary lifestyle;obesity (BMI >30kg/m2)     Objective:    Today's Vitals   03/24/23 1045  03/24/23 1052  Weight: 203 lb (92.1 kg)   Height: 5\' 7"  (1.702 m)   PainSc:  9    Body mass index is 31.79 kg/m.     03/24/2023   10:45 AM 03/09/2022   11:22 AM 10/30/2021   12:44 PM 02/25/2021    8:24 AM 02/20/2021    3:17 PM 12/28/2020    9:33 PM 09/18/2020    9:23 AM  Advanced Directives  Does Patient Have a Medical Advance Directive? No No No No Yes No No  Does patient want to make changes to medical advance directive?    Yes (ED - Information included in AVS)     Would patient like information on creating a medical advance directive? Yes (MAU/Ambulatory/Procedural Areas - Information given) No - Patient declined No - Patient declined No - Patient declined  No - Patient declined No - Patient declined    Current Medications (verified) Outpatient Encounter Medications as of 03/24/2023  Medication Sig   amLODipine-benazepril (LOTREL) 5-40 MG capsule TAKE 1 CAPSULE BY MOUTH DAILY   aspirin EC 81 MG tablet Take 81 mg by mouth daily.   Blood Glucose Monitoring Suppl (ONE TOUCH ULTRA 2) w/Device KIT Twice daily testing dx e11.65   Calcium Carb-Cholecalciferol (CALCIUM 600/VITAMIN D3 PO) Take 2 tablets by mouth daily.   hydrochlorothiazide (HYDRODIURIL) 25 MG tablet TAKE 1 TABLET BY MOUTH DAILY   insulin degludec (TRESIBA FLEXTOUCH) 100 UNIT/ML FlexTouch Pen Inject 60 Units into the skin at bedtime.   Insulin Pen Needle (B-D UF III MINI PEN NEEDLES) 31G X 5 MM MISC USE FOR TWICE DAILY INJECTIONS  OF HUMALOG   Lancets (ONETOUCH DELICA PLUS LANCET33G) MISC USE TWICE DAILY TO CHECK BLOOD  SUGAR  levothyroxine (SYNTHROID) 88 MCG tablet TAKE 1 TABLET BY MOUTH DAILY  BEFORE BREAKFAST   Multiple Vitamin (MULTIVITAMIN) capsule Take 1 capsule by mouth daily.   omeprazole (PRILOSEC) 20 MG capsule TAKE 1 CAPSULE BY MOUTH IN THE  MORNING   ONETOUCH ULTRA test strip USE TO CHECK BLOOD SUGAR TWICE  DAILY   polyethylene glycol (MIRALAX / GLYCOLAX) 17 g packet Take 17 g by mouth as needed.   psyllium  (METAMUCIL) 58.6 % packet Take 1 packet by mouth as needed.   rosuvastatin (CRESTOR) 5 MG tablet Take one tablet by mouth five days per week, Monday though Friday   Semaglutide, 1 MG/DOSE, (OZEMPIC, 1 MG/DOSE,) 4 MG/3ML SOPN Inject 1 mg into the skin once a week.   UNABLE TO FIND Diabetic shoes x 1 pair, Inserts x 3 pair DX E11.9   alendronate (FOSAMAX) 70 MG tablet TAKE 1 TABLET BY MOUTH WEEKLY  WITH 8 OZ OF PLAIN WATER 30  MINUTES BEFORE FIRST FOOD, DRINK OR MEDS. STAY UPRIGHT FOR 30  MINS (Patient not taking: Reported on 03/24/2023)   No facility-administered encounter medications on file as of 03/24/2023.    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    Knee pain    Obesity    Past Surgical History:  Procedure Laterality Date   ABDOMINAL HYSTERECTOMY  03/02/1968   CATARACT EXTRACTION W/PHACO Left 03/09/2019   Procedure: CATARACT EXTRACTION PHACO AND INTRAOCULAR LENS PLACEMENT (IOC);  Surgeon: Fabio Pierce, MD;  Location: [facility];  Service: Ophthalmology;  Laterality: Left;  CDE: 9.08   CATARACT EXTRACTION W/PHACO Right 03/27/2019   Procedure: CATARACT EXTRACTION PHACO AND INTRAOCULAR LENS PLACEMENT RIGHT EYE;  Surgeon: Fabio Pierce, MD;  Location: [facility];  Service: Ophthalmology;  Laterality: Right;  CDE: 7.47   CHOLECYSTECTOMY  OCT 2010 BZ BILIARY DYSKINESIA   CHRONIC CHOLECYSTITIS   COLONOSCOPY  MAY 2009 SCREENING   pTC TICS, [facility]H   COLONOSCOPY N/A 08/11/2017   Procedure: COLONOSCOPY;  Surgeon: West Bali, MD;  Location: [facility];  Service: Endoscopy;  Laterality: N/A;  9:30am    ESOPHAGOGASTRODUODENOSCOPY  10/03/2010   Procedure: ESOPHAGOGASTRODUODENOSCOPY (EGD);  Surgeon: Arlyce Harman, MD;  Location: [facility];  Service: Endoscopy;  Laterality: N/A;   ESOPHAGOGASTRODUODENOSCOPY  12/19/2002   RMR: Normal esophagus/Normal stomach/ Duodenum large bulbar diverticulum, 1 cm bulbar ulcer with surrounding/ inflammation as described above. Normal D2   FOOT SURGERY Left 01/29/2021   POLYPECTOMY  08/11/2017   Procedure: POLYPECTOMY;  Surgeon: West Bali, MD;  Location: [facility];  Service: Endoscopy;;  cecal    SAVORY DILATION  10/03/2010   Procedure: SAVORY DILATION;  Surgeon: Arlyce Harman, MD;  Location: [facility];  Service: Endoscopy;;   TOTAL ABDOMINAL HYSTERECTOMY  03/02/1968   TOTAL KNEE ARTHROPLASTY Left 12/21/2017   Procedure: LEFT TOTAL KNEE ARTHROPLASTY;  Surgeon: Durene Romans, MD;  Location: [facility];  Service: Orthopedics;  Laterality: Left;  70 mins block   UPPER GASTROINTESTINAL ENDOSCOPY  OCT 2004 RMR   DUO TIC, & ULCER  Family History  Problem Relation Age of Onset   Diabetes Mother    Hypertension Mother    Heart failure Mother    Cancer Father 62       prostate and colon   Diabetes Sister    Hypertension Sister    Hypertension Sister    Diabetes Sister    Diabetes Brother    Hypertension Brother    Diabetes Brother    Hypertension Brother    Coronary artery disease Brother 88       MI   Alcohol abuse Son    Colon cancer Neg Hx    Colon polyps Neg Hx    Social History   Socioeconomic History   Marital status: Single    Spouse name: Not on file   Number of children: 3   Years of education: 10   Highest education level: 10th grade  Occupational History   Occupation: [employer]-OR   Occupation: Retired   Tobacco Use   Smoking status: Former    Current packs/day: 0.00    Average packs/day: 0.3 packs/day for 10.0 years (2.5 ttl pk-yrs)    Types: Cigarettes    Start date: 02/21/1975    Quit date: 02/20/1985    Years  since quitting: 38.1   Smokeless tobacco: Never  Vaping Use   Vaping status: Never Used  Substance and Sexual Activity   Alcohol use: No    Comment: stopped drinking 1998   Drug use: No   Sexual activity: Not Currently  Other Topics Concern   Not on file  Social History Narrative   Lives alone and 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   Social Drivers of Health   Financial Resource Strain: Low Risk  (03/24/2023)   Overall Financial Resource Strain (CARDIA)    Difficulty of Paying Living Expenses: Not hard at all  Food Insecurity: No Food Insecurity (03/24/2023)   Hunger Vital Sign    Worried About Running Out of Food in the Last Year: Never true    Ran Out of Food in the Last Year: Never true  Transportation Needs: Unmet Transportation Needs (03/24/2023)   PRAPARE - Transportation    Lack of Transportation (Medical): Yes    Lack of Transportation (Non-Medical): Yes  Physical Activity: Sufficiently Active (03/24/2023)   Exercise Vital Sign    Days of Exercise per Week: 7 days    Minutes of Exercise per Session: 30 min  Stress: No Stress Concern Present (03/24/2023)   [employer] of Occupational Health - Occupational Stress Questionnaire    Feeling of Stress : Not at all  Social Connections: Moderately Integrated (03/24/2023)   Social Connection and Isolation Panel [NHANES]    Frequency of Communication with Friends and Family: More than three times a week    Frequency of Social Gatherings with Friends and Family: More than three times a week    Attends Religious Services: More than 4 times per year    Active Member of [employer] or Organizations: Yes    Attends Engineer, structural: More than 4 times per year    Marital Status: Never married    Tobacco Counseling Counseling given: Yes   Clinical Intake:  Pre-visit preparation completed: Yes  Pain : 0-10 Pain Score: 9  Pain Type: Chronic pain Pain Location: Ankle Pain Orientation: Right, Left Pain  Descriptors / Indicators: Aching, Throbbing, Constant Pain Onset: More than a month ago Pain Frequency: Constant     BMI - recorded: 31.79 Nutritional Status: BMI >  30  Obese Diabetes: Yes CBG done?: No Did pt. bring in CBG monitor from home?: No  How often do you need to have someone help you when you read instructions, pamphlets, or other written materials from your doctor or pharmacy?: 1 - Never  Interpreter Needed?: No  Information entered by :: Maryjean Ka CMA   Activities of Daily Living    03/24/2023   11:00 AM  In your present state of health, do you have any difficulty performing the following activities:  Hearing? 1  Comment has an appt to have hearing tested in Feb 2025  Vision? 0  Difficulty concentrating or making decisions? 0  Walking or climbing stairs? 0  Dressing or bathing? 0  Doing errands, shopping? 0  Preparing Food and eating ? N  Using the Toilet? N  In the past six months, have you accidently leaked urine? N  Do you have problems with loss of bowel control? N  Managing your Medications? N  Managing your Finances? N  Housekeeping or managing your Housekeeping? N    Patient Care Team: Kerri Perches, MD as PCP - General [place] Ottie Glazier, MD (Inactive) as PCP - Cardiology (Cardiology) Newman Pies, MD as Attending Physician (Otolaryngology) Roma Kayser, MD (Endocrinology) Vickki Hearing, MD as Consulting Physician (Orthopedic Surgery) Daisy Lazar, DO (Optometry) Delorise Shiner, CPhT as [facility] Care Management (Pharmacy Technician) Lanelle Bal, DO as Consulting Physician (Internal Medicine) Gavin Pound, [facility] (Inactive) (Pharmacist) Clinton Gallant, RN as [facility] Care Management Gust Brooms, DPM as Attending Physician (Podiatry) Gust Brooms, DPM as Attending Physician (Podiatry) Lanelle Bal, DO as Consulting Physician (Gastroenterology) Rich Number, MD  as Referring Physician (Dentistry) System, Provider Not In as Surgeon  Indicate any recent Medical Services you may have received from other than [facility] providers in the past year (date may be approximate).     Assessment:   This is a routine wellness examination for Stacey Hanson.  Hearing/Vision screen Hearing Screening - Comments:: Patient complains of difficulty with hearing. Patient is already scheduled to have her hearing test in Feb 2025 Vision Screening - Comments:: Wears rx glasses - up to date with routine eye exams  Patient sees Dr. Daisy Lazar w/ [facility] office.     Goals Addressed             This Visit's Progress    Patient Stated       To remain active and healthy and for my family to remain active and healthy       Depression Screen    03/24/2023   11:01 AM 03/19/2023   10:02 AM 09/15/2022   10:07 AM 05/14/2022    2:56 PM 04/15/2022    4:54 PM 03/17/2022   10:16 AM 03/09/2022   11:22 AM  PHQ 2/9 Scores  PHQ - 2 Score 0 0 0 1 2 2 1   PHQ- 9 Score 0   4 6 5      Fall Risk    03/24/2023   10:59 AM 03/19/2023   10:01 AM 09/15/2022   10:07 AM 03/17/2022   10:16 AM 03/09/2022   11:22 AM  Fall Risk   Falls in the past year? 1 0 0 1 0  Number falls in past yr: 1 0 0 0 0  Injury with Fall? 0 0 0 1 0  Risk for fall due to : History of fall(s);Impaired balance/gait;Impaired mobility;Orthopedic patient No Fall Risks Impaired  balance/gait History of fall(s) No Fall Risks  Follow up Falls prevention discussed;Education provided Falls evaluation completed Falls evaluation completed Falls evaluation completed Falls evaluation completed    MEDICARE RISK AT HOME: Medicare Risk at Home Any stairs in or around the home?: No If so, are there any without handrails?: No Home free of loose throw rugs in walkways, pet beds, electrical cords, etc?: Yes Adequate lighting in your home to reduce risk of falls?: Yes Life alert?: No Use of a cane, walker or w/c?: Yes Grab  bars in the bathroom?: Yes Shower chair or bench in shower?: Yes Elevated toilet seat or a handicapped toilet?: Yes  TIMED UP AND GO:  Was the test performed?  No    Cognitive Function:    03/09/2022   11:23 AM  MMSE - Mini Mental State Exam  Not completed: Unable to complete        03/24/2023   10:56 AM 03/09/2022   11:23 AM 02/25/2021    8:31 AM 02/15/2020   10:05 AM 02/14/2019   10:19 AM  6CIT Screen  What Year? 0 points 0 points 0 points 0 points 0 points  What month? 0 points 0 points 0 points 0 points 0 points  What time? 0 points 0 points 0 points 0 points 0 points  Count back from 20 0 points 0 points 0 points 0 points 0 points  Months in reverse 0 points 0 points 0 points 0 points 0 points  Repeat phrase 0 points 0 points 2 points 0 points 0 points  Total Score 0 points 0 points 2 points 0 points 0 points    Immunizations Immunization History  Administered Date(s) Administered   Fluad Quad(high Dose 65+) 11/23/2018, 12/21/2019, 12/10/2020, 11/12/2021, 11/23/2022   Influenza,inj,Quad PF,6+ Mos 11/16/2012, 11/15/2013, 11/14/2014, 11/14/2015, 11/03/2016, 11/09/2017   Influenza,inj,quad, With Preservative 11/02/2016   MODERNA COVID-19 SARS-COV-2 PEDS BIVALENT BOOSTER 8yr-14yr 11/23/2022   Moderna SARS-COV2 Booster Vaccination 07/24/2020, 05/26/2021   Moderna Sars-Covid-2 Vaccination 04/27/2019, 05/26/2019, 01/08/2020   Pneumococcal Conjugate-13 10/11/2013   Pneumococcal Polysaccharide-23 10/07/2011   Rsv, Bivalent, Protein Subunit Rsvpref,pf Verdis Frederickson) 04/18/2022   Tdap 12/16/2020   Zoster Recombinant(Shingrix) 12/16/2020   Zoster, Live 04/05/2006   Zoster, Unspecified 06/16/2021    TDAP status: Up to date  Flu Vaccine status: Up to date  Pneumococcal vaccine status: Up to date  Covid-19 vaccine status: Information provided on how to obtain vaccines.   Qualifies for Shingles Vaccine? No   Zostavax completed Yes   Shingrix Completed?: Yes  Screening  Tests Health Maintenance  Topic Date Due   COVID-19 Vaccine (7 - 2024-25 season) 01/18/2023   OPHTHALMOLOGY EXAM  04/28/2023   DEXA SCAN  05/23/2023   HEMOGLOBIN A1C  08/02/2023   FOOT EXAM  09/15/2023   MAMMOGRAM  12/02/2023   Diabetic kidney evaluation - eGFR measurement  03/17/2024   Diabetic kidney evaluation - Urine ACR  03/18/2024   Medicare Annual Wellness (AWV)  03/18/2024   DTaP/Tdap/Td (2 - Td or Tdap) 12/17/2030   Pneumonia Vaccine 95+ Years old  Completed   INFLUENZA VACCINE  Completed   Hepatitis C Screening  Completed   Zoster Vaccines- Shingrix  Completed   HPV VACCINES  Aged Out   Colonoscopy  Discontinued    Health Maintenance  Health Maintenance Due  Topic Date Due   COVID-19 Vaccine (7 - 2024-25 season) 01/18/2023    Colorectal cancer screening: No longer required.   Mammogram status: Completed 12/02/2022. Repeat every year  Bone Density  status: Completed 05/22/2021. Results reflect: Bone density results: OSTEOPOROSIS. Repeat every 2 years.  Lung Cancer Screening: (Low Dose CT Chest recommended if Age 60-80 years, 20 pack-year currently smoking OR have quit w/in 15years.) does not qualify.   Lung Cancer Screening Referral: na  Additional Screening:  Hepatitis C Screening: does not qualify; Completed   Vision Screening: Recommended annual ophthalmology exams for early detection of glaucoma and other disorders of the eye. Is the patient up to date with their annual eye exam?  Yes  Who is the provider or what is the name of the office in which the patient attends annual eye exams? Patient sees Dr. Daisy Lazar w/ [facility] [place] office.   If pt is not established with a provider, would they like to be referred to a provider to establish care? No .   Dental Screening: Recommended annual dental exams for proper oral hygiene  Diabetic Foot Exam: Diabetic Foot Exam: Completed 09/15/2022  Community Resource Referral / Chronic Care Management: CRR  required this visit?  No   CCM required this visit?  No     Plan:     I have personally reviewed and noted the following in the patient's chart:   Medical and social history Use of alcohol, tobacco or illicit drugs  Current medications and supplements including opioid prescriptions. Patient is not currently taking opioid prescriptions. Functional ability and status Nutritional status Physical activity Advanced directives List of other physicians Hospitalizations, surgeries, and ER visits in previous 12 months Vitals Screenings to include cognitive, depression, and falls Referrals and appointments  In addition, I have reviewed and discussed with patient certain preventive protocols, quality metrics, and best practice recommendations. A written personalized care plan for preventive services as well as general preventive health recommendations were provided to patient.     Jordan Hawks Patra Gherardi, CMA   03/24/2023   After Visit Summary: (Mail) Due to this being a telephonic visit, the after visit summary with patients personalized plan was offered to patient via mail

## 2023-03-24 NOTE — Patient Outreach (Signed)
  Care Coordination   Follow Up Visit Note   03/24/2023 Name: Stacey Hanson MRN: [id number] DOB: 09-Dec-1945  Stacey Hanson is a 78 y.o. year old female who sees Kerri Perches, MD for primary care. I spoke with  [name] by phone today.  What matters to the patients health and wellness today?  2025 more movement and managing elevated heart  Diabetes- maintaining cbg today 175, Last HgA1c was 8.1 on 02/01/23 was 10/9 on 03/18/22 Noticing lower cbgs in the evening Goal for 2025 is more movement- she does chair exercises  Son transports her to [place] and in [place] she rides the [employer] bus  Has dentist appointments soon She has stopped her fosamax and glipizide in preparation  Confirms she is reading more on the side effects of all her medications  Elevated heart rates - Reviewed why heart rates could be elevated  (Exercise, Stress, Medications, Alcohol and drugs, Electrolyte imbalance,Thyroid issues,  Anemia, Heart disease, heart failure, a heart attack, or problems with your heart's valves or muscles, Fever, poor sleep quality Sugary foods and beverages, tobacco use, Supplements, asthma or emphysema)  She denies all possible causes for an elevated heart rate except sugary foods , diabetes, hypothyroidism, constipation, minimal coffee/caffeine and stress related to attending office visits on time.  She had labs this year already and her kidney and thyroid labs look good   Goals Addressed               This Visit's Progress     Patient Stated     Home management of elevated heart rates, exercise, constipation, Diabetes, Hypothyroidism, preventive care- RN CM services (pt-stated)        Interventions Today    Flowsheet Row Most Recent Value  Chronic Disease   Chronic disease during today's visit Diabetes, Hypertension (HTN), Other  [Diabetes, Reasons for elevated heart rates, thyroid, exercise/more movement, constipation]  General Interventions   General  Interventions Discussed/Reviewed General Interventions Reviewed, Labs, Doctor Visits  Labs Hgb A1c every 3 months  Doctor Visits Discussed/Reviewed Doctor Visits Reviewed, PCP, Specialist  PCP/Specialist Visits Compliance with follow-up visit  Communication with PCP/Specialists  Exercise Interventions   Exercise Discussed/Reviewed Exercise Reviewed, Physical Activity, Weight Managment  Physical Activity Discussed/Reviewed Physical Activity Reviewed, Home Exercise Program (HEP)  Weight Management Weight maintenance  Education Interventions   Education Provided Provided Education  Provided Verbal Education On Blood Sugar Monitoring, Medication, Community Resources, Nutrition  Labs Reviewed Hgb A1c  Mental Health Interventions   Mental Health Discussed/Reviewed Mental Health Reviewed, Coping Strategies, Anxiety  Nutrition Interventions   Nutrition Discussed/Reviewed Nutrition Reviewed, Fluid intake, Adding fruits and vegetables  Pharmacy Interventions   Pharmacy Dicussed/Reviewed Pharmacy Topics Reviewed, Medications and their functions, Affording Medications              SDOH assessments and interventions completed:  Yes     Care Coordination Interventions:  Yes, provided   Follow up plan: Follow up call scheduled for 09/24/23    Encounter Outcome:  Patient Visit Completed   Cala Bradford L. Noelle Penner, RN, BSN, CCM [place]  [facility] RN Care Manager Direct Dial: [phone]  Fax: [phone] Mailing Address: [address]  [place] [address] Website: [email or web address]

## 2023-03-24 NOTE — Patient Instructions (Addendum)
Visit Information  Thank you for taking time to visit with me today. Please don't hesitate to contact me if I can be of assistance to you.   Following are the goals we discussed today:   Goals Addressed               This Visit's Progress     Patient Stated     Home management of elevated heart rates, exercise, constipation, Diabetes, Hypothyroidism, preventive care- RN CM services (pt-stated)        Interventions Today    Flowsheet Row Most Recent Value  Chronic Disease   Chronic disease during today's visit Diabetes, Hypertension (HTN), Other  [Diabetes, Reasons for elevated heart rates, thyroid, exercise/more movement, constipation]  General Interventions   General Interventions Discussed/Reviewed General Interventions Reviewed, Labs, Doctor Visits  Labs Hgb A1c every 3 months  Doctor Visits Discussed/Reviewed Doctor Visits Reviewed, PCP, Specialist  PCP/Specialist Visits Compliance with follow-up visit  Communication with PCP/Specialists  Exercise Interventions   Exercise Discussed/Reviewed Exercise Reviewed, Physical Activity, Weight Managment  Physical Activity Discussed/Reviewed Physical Activity Reviewed, Home Exercise Program (HEP)  Weight Management Weight maintenance  Education Interventions   Education Provided Provided Education  Provided Verbal Education On Blood Sugar Monitoring, Medication, Community Resources, Nutrition  Labs Reviewed Hgb A1c  Mental Health Interventions   Mental Health Discussed/Reviewed Mental Health Reviewed, Coping Strategies, Anxiety  Nutrition Interventions   Nutrition Discussed/Reviewed Nutrition Reviewed, Fluid intake, Adding fruits and vegetables  Pharmacy Interventions   Pharmacy Dicussed/Reviewed Pharmacy Topics Reviewed, Medications and their functions, Affording Medications              Our next appointment is by telephone on 09/24/23 at 3:15 pm  Please call the care guide team at [phone] if you need to cancel or  reschedule your appointment.   If you are experiencing a Mental Health or Behavioral Health Crisis or need someone to talk to, please call the Suicide and Crisis Lifeline: 988 call the [place] National Suicide Prevention Lifeline: 805 725 1373 or TTY: 803 793 5571 TTY (929) 713-9732) to talk to a trained counselor call 1-800-273-TALK (toll free, 24 hour hotline) call the [facility]: (705)824-8352 call 911   No computer access, no preference for copy of AVS     The patient has been provided with contact information for the care management team and has been advised to call with any health related questions or concerns.   Anum Palecek L. Noelle Penner, RN, BSN, CCM [place]  [facility] RN Care Manager Direct Dial: [phone]  Fax: [phone] Mailing Address: [address]  [place] [address] Website: [email or web address]

## 2023-04-23 ENCOUNTER — Other Ambulatory Visit: Payer: Self-pay

## 2023-04-23 ENCOUNTER — Telehealth (INDEPENDENT_AMBULATORY_CARE_PROVIDER_SITE_OTHER): Payer: Self-pay | Admitting: Physician Assistant

## 2023-04-23 DIAGNOSIS — R159 Full incontinence of feces: Secondary | ICD-10-CM

## 2023-04-23 MED ORDER — DEPEND UNDERWEAR LARGE/XL MISC: 12 refills | Status: DC

## 2023-04-23 NOTE — Telephone Encounter (Signed)
Reminder Call: Date: 04/26/2023 Status: Sch  Time: 10:30 AM [address] [place], [place] [address]  Confirmed time and location w/patient.

## 2023-04-26 ENCOUNTER — Ambulatory Visit (INDEPENDENT_AMBULATORY_CARE_PROVIDER_SITE_OTHER): Payer: Medicare Other

## 2023-04-26 DIAGNOSIS — H6123 Impacted cerumen, bilateral: Secondary | ICD-10-CM | POA: Diagnosis not present

## 2023-04-26 NOTE — Progress Notes (Unsigned)
 Dear Dr. Lodema Hong, Here is my assessment for our mutual patient, Stacey Hanson. Thank you for allowing me the opportunity to care for your patient. Please do not hesitate to contact me should you have any other questions. Sincerely, Burna Forts PA-C  Otolaryngology Clinic Note Referring provider: Dr. Lodema Hong HPI:  Stacey Hanson is a 78 y.o. female kindly referred by Dr. Lodema Hong   This is a 78 year old female seen in our office today for evaluation of cerumen impaction.  The patient notes that she has had issues with cerumen previously.  She notes fullness in her ears.  She notes some ringing in her ears.  Additionally she has noted some issues with balance and some dizziness.  She notes that she was taking ozempic which caused her to feel this way. She notes that generally when she goes from sitting to standing she gets some dizziness and needs to sit still for a moment which it usually resolves.  Denies any neurologic deficits associated with this.   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    Knee pain    Obesity      Past Surgical History:  Procedure Laterality Date   ABDOMINAL HYSTERECTOMY  03/02/1968   CATARACT EXTRACTION W/PHACO Left 03/09/2019   Procedure: CATARACT EXTRACTION PHACO AND INTRAOCULAR LENS PLACEMENT (IOC);  Surgeon: Fabio Pierce, MD;  Location: [facility];  Service: Ophthalmology;  Laterality: Left;  CDE: 9.08   CATARACT EXTRACTION W/PHACO Right 03/27/2019   Procedure: CATARACT EXTRACTION PHACO AND INTRAOCULAR LENS PLACEMENT RIGHT EYE;  Surgeon: Fabio Pierce,  MD;  Location: [facility];  Service: Ophthalmology;  Laterality: Right;  CDE: 7.47   CHOLECYSTECTOMY  OCT 2010 BZ BILIARY DYSKINESIA   CHRONIC CHOLECYSTITIS   COLONOSCOPY  MAY 2009 SCREENING   pTC TICS, [facility] IH   COLONOSCOPY N/A 08/11/2017   Procedure: COLONOSCOPY;  Surgeon: West Bali, MD;  Location: [facility];  Service: Endoscopy;  Laterality: N/A;  9:30am   ESOPHAGOGASTRODUODENOSCOPY  10/03/2010   Procedure: ESOPHAGOGASTRODUODENOSCOPY (EGD);  Surgeon: Arlyce Harman, MD;  Location: [facility];  Service: Endoscopy;  Laterality: N/A;   ESOPHAGOGASTRODUODENOSCOPY  12/19/2002   RMR: Normal esophagus/Normal stomach/ Duodenum large bulbar diverticulum, 1 cm bulbar ulcer with surrounding/ inflammation as described above. Normal D2   FOOT SURGERY Left 01/29/2021   POLYPECTOMY  08/11/2017   Procedure: POLYPECTOMY;  Surgeon: [place] Bali, MD;  Location: [facility];  Service: Endoscopy;;  cecal    SAVORY DILATION  10/03/2010   Procedure: SAVORY DILATION;  Surgeon: Arlyce Harman, MD;  Location: [facility];  Service: Endoscopy;;   TOTAL ABDOMINAL HYSTERECTOMY  03/02/1968   TOTAL KNEE ARTHROPLASTY Left 12/21/2017   Procedure: LEFT TOTAL KNEE ARTHROPLASTY;  Surgeon: Durene Romans, MD;  Location: [facility];  Service: Orthopedics;  Laterality: Left;  70 mins block   UPPER GASTROINTESTINAL ENDOSCOPY  OCT 2004 RMR   DUO TIC, & ULCER    Family History  Problem Relation Age of Onset   Diabetes Mother    Hypertension Mother  Heart failure Mother    Cancer Father 76       prostate and colon   Diabetes Sister    Hypertension Sister    Hypertension Sister    Diabetes Sister    Diabetes Brother    Hypertension Brother    Diabetes Brother    Hypertension Brother    Coronary artery disease Brother 3       MI   Alcohol abuse Son    Colon cancer Neg Hx    Colon polyps Neg Hx      Social Connections: Moderately Integrated (03/24/2023)   Social Connection and Isolation Panel  [NHANES]    Frequency of Communication with Friends and Family: More than three times a week    Frequency of Social Gatherings with Friends and Family: More than three times a week    Attends Religious Services: More than 4 times per year    Active Member of [employer] or Organizations: Yes    Attends Engineer, structural: More than 4 times per year    Marital Status: Never married      Current Outpatient Medications:    alendronate (FOSAMAX) 70 MG tablet, TAKE 1 TABLET BY MOUTH WEEKLY  WITH 8 OZ OF PLAIN WATER 30  MINUTES BEFORE FIRST FOOD, DRINK OR MEDS. STAY UPRIGHT FOR 30  MINS (Patient not taking: Reported on 03/24/2023), Disp: 12 tablet, Rfl: 3   amLODipine-benazepril (LOTREL) 5-40 MG capsule, TAKE 1 CAPSULE BY MOUTH DAILY, Disp: 100 capsule, Rfl: 2   aspirin EC 81 MG tablet, Take 81 mg by mouth daily., Disp: , Rfl:    bisacodyl (DULCOLAX) 5 MG EC tablet, Take 5 mg by mouth daily as needed for moderate constipation., Disp: , Rfl:    Blood Glucose Monitoring Suppl (ONE TOUCH ULTRA 2) w/Device KIT, Twice daily testing dx e11.65, Disp: 1 kit, Rfl: 0   Calcium Carb-Cholecalciferol (CALCIUM 600/VITAMIN D3 PO), Take 2 tablets by mouth daily., Disp: , Rfl:    hydrochlorothiazide (HYDRODIURIL) 25 MG tablet, TAKE 1 TABLET BY MOUTH DAILY, Disp: 100 tablet, Rfl: 2   Incontinence Supply Disposable (DEPEND UNDERWEAR LARGE/XL) MISC, Use for daily incontinence, Disp: 100 each, Rfl: 12   insulin degludec (TRESIBA FLEXTOUCH) 100 UNIT/ML FlexTouch Pen, Inject 60 Units into the skin at bedtime., Disp: 54 mL, Rfl: 3   Insulin Pen Needle (B-D UF III MINI PEN NEEDLES) 31G X 5 MM MISC, USE FOR TWICE DAILY INJECTIONS  OF HUMALOG, Disp: 180 each, Rfl: 1   Lancets (ONETOUCH DELICA PLUS LANCET33G) MISC, USE TWICE DAILY TO CHECK BLOOD  SUGAR, Disp: 200 each, Rfl: 2   levothyroxine (SYNTHROID) 88 MCG tablet, TAKE 1 TABLET BY MOUTH DAILY  BEFORE BREAKFAST, Disp: 100 tablet, Rfl: 2   Multiple Vitamin (MULTIVITAMIN)  capsule, Take 1 capsule by mouth daily., Disp: , Rfl:    omeprazole (PRILOSEC) 20 MG capsule, TAKE 1 CAPSULE BY MOUTH IN THE  MORNING, Disp: 100 capsule, Rfl: 2   ONETOUCH ULTRA test strip, USE TO CHECK BLOOD SUGAR TWICE  DAILY, Disp: 200 strip, Rfl: 2   polyethylene glycol (MIRALAX / GLYCOLAX) 17 g packet, Take 17 g by mouth as needed., Disp: , Rfl:    psyllium (METAMUCIL) 58.6 % packet, Take 1 packet by mouth as needed., Disp: , Rfl:    rosuvastatin (CRESTOR) 5 MG tablet, Take one tablet by mouth five days per week, Monday though Friday, Disp: 60 tablet, Rfl: 3   Semaglutide, 1 MG/DOSE, (OZEMPIC, 1 MG/DOSE,) 4 MG/3ML SOPN,  Inject 1 mg into the skin once a week., Disp: 9 mL, Rfl: 1   UNABLE TO FIND, Diabetic shoes x 1 pair, Inserts x 3 pair DX E11.9, Disp: 1 each, Rfl: 0   Physical Exam:   There were no vitals taken for this visit.  Pertinent Findings  CN II-XII grossly intact  Bilateral EAC with cerumen impaction  Weber 512: equal Rinne 512: AC > BC b/l  Anterior rhinoscopy: Septum midline; bilateral inferior turbinates with no hypertrophy  No lesions of oral cavity/oropharynx; dentition wnl No obviously palpable neck masses/lymphadenopathy/thyromegaly No respiratory distress or stridor  Seprately Identifiable Procedures:  Procedure: Bilateral ear microscopy and cerumen removal using microscope (CPT 69210) - Mod 50 Pre-procedure diagnosis: Cerumen impaction  external ears Post-procedure diagnosis: same Indication:  cerumen impaction; given patient's otologic complaints and history as well as for improved and comprehensive examination of external ear and tympanic membrane, bilateral otologic examination using microscope was performed and impacted cerumen removed  Procedure: Patient was placed semi-recumbent. Both ear canals were examined using the microscope with findings above. Cerumen removed on left and on right using suction and currette with improvement in EAC examination and  patency. Left: EAC was patent. TM was intact . Middle ear was aerated. Drainage: none Right: EAC was patent. TM was intact . Middle ear was aerated . Drainage: none Patient tolerated the procedure well.      Impression & Plans:  Bailee Metter is a 78 y.o. female with the following   Cerumen impaction-  Removed without issues. She did have an episode of dizziness after removal, this quickly resolved, she reports this is a chronic issue, no other associated symptoms   - f/u I would like to see her back in 6-12 months for repeat visit   Thank you for allowing me the opportunity to care for your patient. Please do not hesitate to contact me should you have any other questions.  Sincerely, Burna Forts PA-C [facility] Phone: [phone] Fax: [phone]  04/26/2023, 10:31 AM

## 2023-04-29 ENCOUNTER — Encounter (INDEPENDENT_AMBULATORY_CARE_PROVIDER_SITE_OTHER): Payer: Self-pay | Admitting: Physician Assistant

## 2023-05-05 DIAGNOSIS — M79671 Pain in right foot: Secondary | ICD-10-CM | POA: Diagnosis not present

## 2023-05-05 DIAGNOSIS — M79675 Pain in left toe(s): Secondary | ICD-10-CM | POA: Diagnosis not present

## 2023-05-05 DIAGNOSIS — M79674 Pain in right toe(s): Secondary | ICD-10-CM | POA: Diagnosis not present

## 2023-05-05 DIAGNOSIS — E1151 Type 2 diabetes mellitus with diabetic peripheral angiopathy without gangrene: Secondary | ICD-10-CM | POA: Diagnosis not present

## 2023-05-05 DIAGNOSIS — M79672 Pain in left foot: Secondary | ICD-10-CM | POA: Diagnosis not present

## 2023-05-11 ENCOUNTER — Telehealth: Payer: Self-pay | Admitting: Family Medicine

## 2023-05-11 NOTE — Telephone Encounter (Signed)
 Incontinence supplies was ordered on patient but the company needs notes where you discuss the need for incontinence supplies for insurance and added to diagnosis list. Once complete I can resend the order to [facility] with requested info.

## 2023-05-17 ENCOUNTER — Other Ambulatory Visit: Payer: Self-pay | Admitting: Nurse Practitioner

## 2023-05-17 ENCOUNTER — Other Ambulatory Visit: Payer: Self-pay

## 2023-05-17 DIAGNOSIS — R159 Full incontinence of feces: Secondary | ICD-10-CM

## 2023-05-17 MED ORDER — DEPEND UNDERWEAR LARGE/XL MISC: 12 refills | Status: DC

## 2023-05-18 ENCOUNTER — Other Ambulatory Visit: Payer: Self-pay | Admitting: Family Medicine

## 2023-05-18 DIAGNOSIS — R32 Unspecified urinary incontinence: Secondary | ICD-10-CM | POA: Insufficient documentation

## 2023-05-18 NOTE — Assessment & Plan Note (Signed)
 Reports incontinence as stated , needs incontinence pads twice daily use for hygiene and comfort Does not need chux or underwear, needs maximum absorbency

## 2023-05-18 NOTE — Telephone Encounter (Signed)
Order faxed with notes. 

## 2023-05-31 ENCOUNTER — Other Ambulatory Visit: Payer: Self-pay | Admitting: Family Medicine

## 2023-05-31 DIAGNOSIS — E1165 Type 2 diabetes mellitus with hyperglycemia: Secondary | ICD-10-CM | POA: Diagnosis not present

## 2023-05-31 DIAGNOSIS — E890 Postprocedural hypothyroidism: Secondary | ICD-10-CM | POA: Diagnosis not present

## 2023-06-01 LAB — COMPREHENSIVE METABOLIC PANEL WITH GFR
ALT: 17 IU/L (ref 0–32)
AST: 27 IU/L (ref 0–40)
Albumin: 4.4 g/dL (ref 3.8–4.8)
Alkaline Phosphatase: 98 IU/L (ref 44–121)
BUN/Creatinine Ratio: 19 (ref 12–28)
BUN: 22 mg/dL (ref 8–27)
Bilirubin Total: 0.3 mg/dL (ref 0.0–1.2)
CO2: 24 mmol/L (ref 20–29)
Calcium: 9.7 mg/dL (ref 8.7–10.3)
Chloride: 99 mmol/L (ref 96–106)
Creatinine, Ser: 1.18 mg/dL — ABNORMAL HIGH (ref 0.57–1.00)
Globulin, Total: 3.2 g/dL (ref 1.5–4.5)
Glucose: 113 mg/dL — ABNORMAL HIGH (ref 70–99)
Potassium: 4.3 mmol/L (ref 3.5–5.2)
Sodium: 139 mmol/L (ref 134–144)
Total Protein: 7.6 g/dL (ref 6.0–8.5)
eGFR: 48 mL/min/{1.73_m2} — ABNORMAL LOW (ref >59)

## 2023-06-01 LAB — TSH: TSH: 0.999 u[IU]/mL (ref 0.450–4.500)

## 2023-06-01 LAB — T4, FREE: Free T4: 1.31 ng/dL (ref 0.82–1.77)

## 2023-06-02 ENCOUNTER — Encounter: Payer: Self-pay | Admitting: Nurse Practitioner

## 2023-06-02 ENCOUNTER — Ambulatory Visit: Payer: Medicare Other | Admitting: Nurse Practitioner

## 2023-06-02 VITALS — BP 124/78 | HR 67 | Ht 67.0 in | Wt 200.8 lb

## 2023-06-02 DIAGNOSIS — I10 Essential (primary) hypertension: Secondary | ICD-10-CM

## 2023-06-02 DIAGNOSIS — E1165 Type 2 diabetes mellitus with hyperglycemia: Secondary | ICD-10-CM | POA: Diagnosis not present

## 2023-06-02 DIAGNOSIS — Z7984 Long term (current) use of oral hypoglycemic drugs: Secondary | ICD-10-CM | POA: Diagnosis not present

## 2023-06-02 DIAGNOSIS — Z794 Long term (current) use of insulin: Secondary | ICD-10-CM

## 2023-06-02 DIAGNOSIS — E782 Mixed hyperlipidemia: Secondary | ICD-10-CM

## 2023-06-02 DIAGNOSIS — Z7985 Long-term (current) use of injectable non-insulin antidiabetic drugs: Secondary | ICD-10-CM | POA: Diagnosis not present

## 2023-06-02 DIAGNOSIS — E890 Postprocedural hypothyroidism: Secondary | ICD-10-CM

## 2023-06-02 LAB — POCT GLYCOSYLATED HEMOGLOBIN (HGB A1C): Hemoglobin A1C: 8.2 % — AB (ref 4.0–5.6)

## 2023-06-02 NOTE — Progress Notes (Signed)
 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    Knee pain    Obesity    Past Surgical History:  Procedure Laterality Date   ABDOMINAL HYSTERECTOMY  03/02/1968   CATARACT EXTRACTION W/PHACO Left 03/09/2019   Procedure: CATARACT EXTRACTION PHACO AND INTRAOCULAR LENS PLACEMENT (IOC);  Surgeon: Fabio Pierce, MD;  Location: [facility];  Service: Ophthalmology;  Laterality: Left;  CDE: 9.08   CATARACT EXTRACTION W/PHACO Right 03/27/2019   Procedure: CATARACT EXTRACTION PHACO AND INTRAOCULAR LENS PLACEMENT RIGHT EYE;  Surgeon: Fabio Pierce, MD;  Location: [facility];  Service: Ophthalmology;  Laterality: Right;  CDE: 7.47   CHOLECYSTECTOMY  OCT 2010 BZ BILIARY DYSKINESIA   CHRONIC CHOLECYSTITIS   COLONOSCOPY  MAY 2009 SCREENING   pTC TICS, [facility] IH   COLONOSCOPY N/A 08/11/2017   Procedure: COLONOSCOPY;  Surgeon: West Bali, MD;  Location: [facility];  Service: Endoscopy;  Laterality: N/A;  9:30am   ESOPHAGOGASTRODUODENOSCOPY  10/03/2010   Procedure: ESOPHAGOGASTRODUODENOSCOPY (EGD);  Surgeon: Arlyce Harman, MD;  Location: [facility];  Service: Endoscopy;  Laterality: N/A;   ESOPHAGOGASTRODUODENOSCOPY  12/19/2002   RMR: Normal esophagus/Normal stomach/ Duodenum large bulbar diverticulum, 1 cm bulbar ulcer with surrounding/ inflammation as described above. Normal D2   FOOT SURGERY Left 01/29/2021   POLYPECTOMY  08/11/2017   Procedure: POLYPECTOMY;  Surgeon: West Bali, MD;  Location: [facility];  Service: Endoscopy;;  cecal     SAVORY DILATION  10/03/2010   Procedure: SAVORY DILATION;  Surgeon: Arlyce Harman, MD;  Location: [facility];  Service: Endoscopy;;   TOTAL ABDOMINAL HYSTERECTOMY  03/02/1968   TOTAL KNEE ARTHROPLASTY Left 12/21/2017   Procedure: LEFT TOTAL KNEE ARTHROPLASTY;  Surgeon: Durene Romans, MD;  Location: [facility];  Service: Orthopedics;  Laterality: Left;  70 mins block   UPPER GASTROINTESTINAL ENDOSCOPY  OCT 2004 RMR   DUO TIC, & ULCER   Social History   Socioeconomic History   Marital status: Single    Spouse name: Not on file   Number of children: 3   Years of education: 10   Highest education level: 10th grade  Occupational History   Occupation: [employer]   Occupation: Retired   Tobacco Use   Smoking status: Former    Current packs/day: 0.00    Average packs/day: 0.3 packs/day for 10.0 years (2.5 ttl pk-yrs)    Types: Cigarettes    Start date: 02/21/1975    Quit date: 02/20/1985    Years since quitting: 38.3   Smokeless tobacco: Never  Vaping Use   Vaping status: Never Used  Substance and Sexual Activity   Alcohol use: No    Comment: stopped drinking 1998   Drug use: No   Sexual activity: Not Currently  Other Topics Concern   Not on file  Social History Narrative   Lives alone and 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   Social Drivers of Corporate investment banker  Strain: Low Risk  (03/24/2023)   Overall Financial Resource Strain (CARDIA)    Difficulty of Paying Living Expenses: Not hard at all  Food Insecurity: No Food Insecurity (03/24/2023)   Hunger Vital Sign    Worried About Running Out of Food in the Last Year: Never true    Ran Out of Food in the Last Year: Never true  Transportation Needs: Unmet Transportation Needs (03/24/2023)   PRAPARE - Transportation    Lack of Transportation (Medical): Yes    Lack of Transportation (Non-Medical): Yes  Physical Activity: Sufficiently Active (03/24/2023)   Exercise Vital Sign    Days of Exercise per Week: 7 days     Minutes of Exercise per Session: 30 min  Stress: No Stress Concern Present (03/24/2023)   [employer] of Occupational Health - Occupational Stress Questionnaire    Feeling of Stress : Not at all  Social Connections: Moderately Integrated (03/24/2023)   Social Connection and Isolation Panel [NHANES]    Frequency of Communication with Friends and Family: More than three times a week    Frequency of Social Gatherings with Friends and Family: More than three times a week    Attends Religious Services: More than 4 times per year    Active Member of [employer] or Organizations: Yes    Attends Banker Meetings: More than 4 times per year    Marital Status: Never married   Outpatient Encounter Medications as of 06/02/2023  Medication Sig   alendronate (FOSAMAX) 70 MG tablet TAKE 1 TABLET BY MOUTH WEEKLY  WITH 8 OZ OF PLAIN WATER 30  MINUTES BEFORE FIRST FOOD, DRINK OR MEDS. STAY UPRIGHT FOR 30  MINS   amLODipine-benazepril (LOTREL) 5-40 MG capsule TAKE 1 CAPSULE BY MOUTH DAILY   aspirin EC 81 MG tablet Take 81 mg by mouth daily.   bisacodyl (DULCOLAX) 5 MG EC tablet Take 5 mg by mouth daily as needed for moderate constipation.   Blood Glucose Monitoring Suppl (ONE TOUCH ULTRA 2) w/Device KIT Twice daily testing dx e11.65   Calcium Carb-Cholecalciferol (CALCIUM 600/VITAMIN D3 PO) Take 2 tablets by mouth daily.   hydrochlorothiazide (HYDRODIURIL) 25 MG tablet TAKE 1 TABLET BY MOUTH DAILY   Incontinence Supply Disposable (DEPEND UNDERWEAR LARGE/XL) MISC Use for daily incontinence DX R32   insulin degludec (TRESIBA FLEXTOUCH) 100 UNIT/ML FlexTouch Pen Inject 60 Units into the skin at bedtime.   Insulin Pen Needle (B-D UF III MINI PEN NEEDLES) 31G X 5 MM MISC USE FOR TWICE DAILY INJECTIONS  OF HUMALOG   Lancets (ONETOUCH DELICA PLUS LANCET33G) MISC USE TWICE DAILY TO CHECK BLOOD  SUGAR   levothyroxine (SYNTHROID) 88 MCG tablet TAKE 1 TABLET BY MOUTH DAILY  BEFORE BREAKFAST   Multiple Vitamin  (MULTIVITAMIN) capsule Take 1 capsule by mouth daily.   omeprazole (PRILOSEC) 20 MG capsule TAKE 1 CAPSULE BY MOUTH IN THE  MORNING   ONETOUCH ULTRA test strip USE TO CHECK BLOOD SUGAR TWICE  DAILY   polyethylene glycol (MIRALAX / GLYCOLAX) 17 g packet Take 17 g by mouth as needed.   psyllium (METAMUCIL) 58.6 % packet Take 1 packet by mouth as needed.   rosuvastatin (CRESTOR) 5 MG tablet Take one tablet by mouth five days per week, Monday though Friday   Semaglutide, 1 MG/DOSE, (OZEMPIC, 1 MG/DOSE,) 4 MG/3ML SOPN Inject 1 mg into the skin once a week.   UNABLE TO FIND Diabetic shoes x 1 pair, Inserts x 3 pair DX E11.9   No facility-administered encounter  medications on file as of 06/02/2023.   ALLERGIES: Allergies  Allergen Reactions   Pravastatin Other (See Comments)    PT is currently taking but states she cramps   Simvastatin Other (See Comments)    Muscle cramps Other reaction(s): Not available   VACCINATION STATUS: Immunization History  Administered Date(s) Administered   Fluad Quad(high Dose 65+) 11/23/2018, 12/21/2019, 12/10/2020, 11/12/2021, 11/23/2022   Influenza,inj,Quad PF,6+ Mos 11/16/2012, 11/15/2013, 11/14/2014, 11/14/2015, 11/03/2016, 11/09/2017   Influenza,inj,quad, With Preservative 11/02/2016   MODERNA COVID-19 SARS-COV-2 PEDS BIVALENT BOOSTER 75yr-55yr 11/23/2022   Moderna SARS-COV2 Booster Vaccination 07/24/2020, 05/26/2021   Moderna Sars-Covid-2 Vaccination 04/27/2019, 05/26/2019, 01/08/2020   Pneumococcal Conjugate-13 10/11/2013   Pneumococcal Polysaccharide-23 10/07/2011   Rsv, Bivalent, Protein Subunit Rsvpref,pf Verdis Frederickson) 04/18/2022   Tdap 12/16/2020   Zoster Recombinant(Shingrix) 12/16/2020   Zoster, Live 04/05/2006   Zoster, Unspecified 06/16/2021    Hypertension This is a chronic problem. The current episode started more than 1 year ago. The problem has been resolved since onset. The problem is controlled. Pertinent negatives include no blurred vision,  headaches, palpitations, shortness of breath or sweats. Agents associated with hypertension include thyroid hormones. Risk factors for coronary artery disease include diabetes mellitus, dyslipidemia, obesity, sedentary lifestyle and smoking/tobacco exposure. Past treatments include ACE inhibitors, diuretics and calcium channel blockers. The current treatment provides mild improvement. There are no compliance problems.  Hypertensive end-organ damage includes kidney disease. Identifiable causes of hypertension include chronic renal disease and a thyroid problem.  Diabetes She presents for her follow-up diabetic visit. She has type 2 diabetes mellitus. Onset time: She was diagnosed at approximate age of 40 years. Her disease course has been stable. Pertinent negatives for hypoglycemia include no confusion, headaches, nervousness/anxiousness, sweats or tremors. Pertinent negatives for diabetes include no blurred vision, no fatigue, no foot paresthesias, no polydipsia, no polyphagia, no polyuria and no weight loss. There are no hypoglycemic complications. Symptoms are stable. Diabetic complications include nephropathy. Risk factors for coronary artery disease include dyslipidemia, diabetes mellitus, obesity, hypertension, sedentary lifestyle, post-menopausal and tobacco exposure. Current diabetic treatment includes insulin injections and oral agent (monotherapy) (and Ozempic). She is compliant with treatment most of the time. Her weight is fluctuating minimally. She is following a generally unhealthy diet. When asked about meal planning, she reported none. She has not had a previous visit with a dietitian. She rarely participates in exercise. Her home blood glucose trend is fluctuating minimally. Her breakfast blood glucose range is generally 90-110 mg/dl. Her dinner blood glucose range is generally 140-180 mg/dl. (She presents today with her meter and logs showing improving glycemic profile.  Her POCT A1c today is  8.2%, essentially unchanged from previous visit.  Analysis of her meter shows 7-day average of 168, 14-day average of 169, 30-day average of 171.  She denies any significant hypoglycemia.  She does note she feels weak with the dose of insulin and the Ozempic.  She notes she is a bit constipated but not enough to want to stop the Ozempic.) An ACE inhibitor/angiotensin II receptor blocker is being taken. She does not see a podiatrist.Eye exam is current.  Thyroid Problem Presents for follow-up (-She is also status post radioactive iodine treatment for toxic nodule on October 17, 2010.  She has had  history of multinodular goiter status post fine-needle aspiration with benign findings.) visit. Patient reports no anxiety, cold intolerance, constipation, depressed mood, diarrhea, fatigue, heat intolerance, leg swelling, palpitations, tremors, weight gain or weight loss. The symptoms have been stable.  Review of systems  Constitutional: + stable body weight,  current Body mass index is 31.45 kg/m. , + fatigue, no subjective hyperthermia, no subjective hypothermia Eyes: no blurry vision, no xerophthalmia ENT: no sore throat, no nodules palpated in throat, no dysphagia/odynophagia, no hoarseness Cardiovascular: no chest pain, no shortness of breath, no palpitations, no leg swelling Respiratory: no cough, no shortness of breath Gastrointestinal: no nausea/vomiting/diarrhea Musculoskeletal: no muscle/joint aches Skin: no rashes, no hyperemia Neurological: no tremors, no numbness, no tingling, no dizziness Psychiatric: no depression, no anxiety    Objective:    BP 124/78 (BP Location: Left Arm, Patient Position: Sitting, Cuff Size: Large)   Pulse 67   Ht 5\' 7"  (1.702 m)   Wt 200 lb 12.8 oz (91.1 kg)   BMI 31.45 kg/m   Wt Readings from Last 3 Encounters:  06/02/23 200 lb 12.8 oz (91.1 kg)  03/24/23 203 lb (92.1 kg)  03/19/23 203 lb 0.6 oz (92.1 kg)    BP Readings from Last 3 Encounters:   06/02/23 124/78  03/19/23 128/69  02/01/23 138/73    Physical Exam- Limited  Constitutional:  Body mass index is 31.45 kg/m. , not in acute distress, normal state of mind, ? Memory deficits Eyes:  EOMI, no exophthalmos Musculoskeletal: no gross deformities, strength intact in all four extremities, no gross restriction of joint movements Skin:  no rashes, no hyperemia Neurological: no tremor with outstretched hands    CMP     Component Value Date/Time   NA 139 05/31/2023 1000   K 4.3 05/31/2023 1000   CL 99 05/31/2023 1000   CO2 24 05/31/2023 1000   GLUCOSE 113 (H) 05/31/2023 1000   GLUCOSE 395 (H) 12/28/2020 2201   BUN 22 05/31/2023 1000   CREATININE 1.18 (H) 05/31/2023 1000   CREATININE 0.96 (H) 03/12/2016 0925   CALCIUM 9.7 05/31/2023 1000   PROT 7.6 05/31/2023 1000   ALBUMIN 4.4 05/31/2023 1000   AST 27 05/31/2023 1000   ALT 17 05/31/2023 1000   ALKPHOS 98 05/31/2023 1000   BILITOT 0.3 05/31/2023 1000   GFRNONAA 40 (L) 12/28/2020 2201   GFRNONAA 60 03/12/2016 0925   GFRAA 48 (L) 04/11/2020 1007   GFRAA 69 03/12/2016 0925     Diabetic Labs (most recent): Lab Results  Component Value Date   HGBA1C 8.2 (A) 06/02/2023   HGBA1C 8.1 (A) 02/01/2023   HGBA1C 8.0 (A) 09/28/2022   MICROALBUR 10 04/19/2020   MICROALBUR 0.4 03/12/2016   MICROALBUR 0.2 02/19/2014     Lipid Panel ( most recent) Lipid Panel     Component Value Date/Time   CHOL 169 03/18/2023 1043   TRIG 77 03/18/2023 1043   HDL 57 03/18/2023 1043   CHOLHDL 3.0 03/18/2023 1043   CHOLHDL 2.2 01/11/2016 0916   VLDL 11 01/11/2016 0916   LDLCALC 97 03/18/2023 1043    Assessment & Plan:   1) Uncontrolled type 2 diabetes mellitus with stage 3a chronic kidney disease, with long-term current use of insulin (HCC)  She presents today with her meter and logs showing improving glycemic profile.  Her POCT A1c today is 8.2%, essentially unchanged from previous visit.  Analysis of her meter shows 7-day  average of 168, 14-day average of 169, 30-day average of 171.  She denies any significant hypoglycemia.  She does note she feels weak with the dose of insulin and the Ozempic.  She notes she is a bit constipated but not enough to want to stop the Ozempic.  - Nutritional  counseling repeated at each appointment due to patients tendency to fall back in to old habits.  - The patient admits there is a room for improvement in their diet and drink choices. -  Suggestion is made for the patient to avoid simple carbohydrates from their diet including Cakes, Sweet Desserts / Pastries, Ice Cream, Soda (diet and regular), Sweet Tea, Candies, Chips, Cookies, Sweet Pastries, Store Bought Juices, Alcohol in Excess of 1-2 drinks a day, Artificial Sweeteners, Coffee Creamer, and "Sugar-free" Products. This will help patient to have stable blood glucose profile and potentially avoid unintended weight gain.   - I encouraged the patient to switch to unprocessed or minimally processed complex starch and increased protein intake (animal or plant source), fruits, and vegetables.   - Patient is advised to stick to a routine mealtimes to eat 3 meals a day and avoid unnecessary snacks (to snack only to correct hypoglycemia).  -She is advised to lower her Tresiba to 50 units SQ nightly and continue Ozempic 1 mg SQ weekly.  She has done reasonably well without the Glipizide, is advised to stay off of it for now.  -She is advised to continue monitoring blood glucose twice daily, before breakfast and before bed, and to call the clinic if she has readings less than 70 or above 200 for 3 tests in a row.  She does not want a CGM, has tried it and does not like it.  2) Hypothyroidism- s/p RAI -Her previsit TFTs are consistent with appropriate hormone replacement.  She is advised to continue Levothyroxine 88 mcg po daily before breakfast.    - We discussed about the correct intake of her thyroid hormone, on empty stomach at fasting,  with water, separated by at least 30 minutes from breakfast and other medications,  and separated by more than 4 hours from calcium, iron, multivitamins, acid reflux medications (PPIs). -Patient is made aware of the fact that thyroid hormone replacement is needed for life, dose to be adjusted by periodic monitoring of thyroid function tests.  3) Weight management:  Her Body mass index is 31.45 kg/m.-a candidate for modest weight loss.  Patient specific carbs restrictions and exercise regimen discussed with her.  4) Blood pressure/ Hypertension: -Her blood pressure is controlled to target.  She is advised to continue Amlodipine Benazepril 5-40 mg po daily and HCTZ 25 mg po daily.    5) Hyperlipidemia: Her recent lipid panel from 03/18/23 showed controlled LDL of 97.  She is advised to continue Crestor 5 mg po daily at bedtime (taking it 5 days a week) as prescribed by her PCP.  Side effects and precautions discussed with her.  - I advised patient to maintain close follow up with Kerri Perches, MD for diabetes and all other primary care needs.     I spent  49  minutes in the care of the patient today including review of labs from CMP, Lipids, Thyroid Function, Hematology (current and previous including abstractions from other facilities); face-to-face time discussing  her blood glucose readings/logs, discussing hypoglycemia and hyperglycemia episodes and symptoms, medications doses, her options of short and long term treatment based on the latest standards of care / guidelines;  discussion about incorporating lifestyle medicine;  and documenting the encounter. Risk reduction counseling performed per USPSTF guidelines to reduce obesity and cardiovascular risk factors.     Please refer to Patient Instructions for Blood Glucose Monitoring and Insulin/Medications Dosing Guide"  in media tab for additional information. Please  also refer to "  Patient Self Inventory" in the Media  tab for reviewed  elements of pertinent patient history.  Stacey Hanson participated in the discussions, expressed understanding, and voiced agreement with the above plans.  All questions were answered to her satisfaction. she is encouraged to contact clinic should she have any questions or concerns prior to her return visit.    Follow up plan: Return in about 4 months (around 10/02/2023) for Diabetes F/U with A1c in office, No previsit labs, Bring meter and logs.   Ronny Bacon, [facility] [address] [place], [place] [address] Phone: [phone] Fax: [phone]  06/02/2023, 11:48 AM

## 2023-06-09 ENCOUNTER — Telehealth: Payer: Self-pay

## 2023-06-09 NOTE — Telephone Encounter (Signed)
 Patient was identified as falling into the [place] Measure - Diabetes.   Patient was: Appointment already scheduled for:  09/06/23. Patient followed by ENDO and PCP

## 2023-06-17 ENCOUNTER — Other Ambulatory Visit: Payer: Self-pay | Admitting: Nurse Practitioner

## 2023-06-24 ENCOUNTER — Other Ambulatory Visit: Payer: Self-pay | Admitting: Family Medicine

## 2023-07-01 ENCOUNTER — Encounter: Payer: Self-pay | Admitting: *Deleted

## 2023-07-01 ENCOUNTER — Other Ambulatory Visit: Payer: Self-pay

## 2023-07-01 ENCOUNTER — Telehealth: Payer: Self-pay | Admitting: *Deleted

## 2023-07-01 NOTE — Patient Instructions (Signed)
 Visit Information  Thank you for taking time to visit with me today. Please don't hesitate to contact me if I can be of assistance to you before our next scheduled appointment.  Your next care management appointment is no further scheduled appointments.  on as needed  at as needed  Patient has met all care management goals. Care Management case will be closed. Patient has been provided contact information should new needs arise.  Patient prefers the availability to be able to outreach as needed to RN CM unless pcp decides to re refer her to new office [facility] RN CM   Please call the care guide team at [phone] if you need to cancel, schedule, or reschedule an appointment.   Please call the Suicide and Crisis Lifeline: 988 call the [place]  National Suicide Prevention Lifeline: 518-373-3212 or TTY: 707-201-5370 TTY 6308700600) to talk to a trained counselor call 1-800-273-TALK (toll free, 24 hour hotline) call the [facility]: [phone] call 911 if you are experiencing a Mental Health or Behavioral Health Crisis or need someone to talk to.  Sarayah Bacchi L. Mcarthur Speedy, RN, BSN, CCM   [facility], [facility] RN Care Manager Direct Dial: [phone]  Fax: [phone]

## 2023-07-01 NOTE — Patient Outreach (Signed)
 Complex Care Management   Visit Note  07/01/2023  Name:  Stacey Hanson MRN: [id number] DOB: Dec 08, 1945  Situation: Referral received for Complex Care Management related to Diabetes with Complications and SDOH Barriers:  Transportation I obtained verbal consent from Patient.  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 pylori infection 09/24/2010   Hyperlipidemia    Hypertension    Hypothyroidism    IDDM (insulin  dependent diabetes mellitus) 1987   type 2    Knee pain    Obesity     Assessment: Patient Reported Symptoms:  Cognitive Cognitive Status: Able to follow simple commands, Insightful and able to interpret abstract concepts, Alert and oriented to person, place, and time, Normal speech and language skills Cognitive/Intellectual Conditions Management [RPT]: None reported or documented in medical history or problem list   Health Maintenance Behaviors: Annual physical exam, Healthy diet, Immunizations, Sleep adequate, Social activities, Spiritual practice(s), Stress management Healing Pattern: Average Health Facilitated by: Healthy diet, Pain control, Prayer/meditation, Rest, Stress management  Neurological Neurological Review of Symptoms: No symptoms reported Neurological Management Strategies: Activity, Adequate rest, Coping strategies, Diet modification, Fluid modification, Routine screening, Weight management Neurological Self-Management Outcome: 4 (good)  HEENT HEENT Symptoms Reported: Change or loss of hearing, Mouth or teeth pain HEENT Conditions: Ear problem(s), Tooth problem(s) Tooth Problems: pain, cavities HEENT Management Strategies: Activity, Adequate rest, Coping strategies, Diet modification, Fluid modification, Medical device,  Medication therapy, Routine screening, Weight management HEENT Self-Management Outcome: 3 (uncertain) HEENT Comment: history of hearing loss, impacted ears needing annual cleaning- Pending oral surgery Ear problem(s), Tooth problem(s)  Cardiovascular Cardiovascular Symptoms Reported: No symptoms reported Cardiovascular Conditions: Hypertension, High blood cholesterol Cardiovascular Management Strategies: Activity, Adequate rest, Coping strategies, Diet modification, Fluid modification, Medical device, Medication therapy, Routine screening, Weight management Do You Have a Working Readable Scale?: Yes Weight: 200 lb (90.7 kg) Cardiovascular Self-Management Outcome: 4 (good)  Respiratory Respiratory Symptoms Reported: No symptoms reported Respiratory Conditions: Seasonal allergies Respiratory Self-Management Outcome: 4 (good) Respiratory Comment: prefers to stay inside when pollen is high  Endocrine Patient reports the following symptoms related to hypoglycemia or hyperglycemia : Hunger Is patient diabetic?: Yes Is patient checking blood sugars at home?: Yes Endocrine Conditions: Diabetes, Thyroid  disorder Endocrine Management Strategies: Activity, Adequate rest, Fluid modification, Diet modification, Routine screening, Weight management Endocrine Self-Management Outcome: 4 (good)  Gastrointestinal Gastrointestinal Symptoms Reported: Obesity Gastrointestinal Conditions: Reflux/heartburn, Constipation Gastrointestinal Management Strategies: Activity, Adequate rest, Diet modification, Fluid modification, Medication therapy Gastrointestinal Self-Management Outcome: 4 (good) Nutrition Risk Screen (CP): No indicators present  Genitourinary Genitourinary Symptoms Reported: No symptoms reported Additional Genitourinary Details: some urinary incontinence Genitourinary Conditions: Incontinence Genitourinary Management Strategies: Adequate rest, Diet modification, Fluid modification, Incontinence  garment/pad Genitourinary Self-Management Outcome: 4 (good)  Integumentary Integumentary Symptoms Reported: No symptoms reported Skin Management Strategies: Routine screening, Diet modification, Fluid modification Skin Self-Management Outcome: 4 (good)  Musculoskeletal Musculoskelatal Symptoms Reviewed: Difficulty walking Musculoskeletal Conditions: Back pain, Joint pain, Osteoarthritis, Osteopenia (hip & left knee) Musculoskeletal Management Strategies: Activity, Adequate rest, Routine screening Musculoskeletal Self-Management Outcome: 4 (good) Falls in the past year?: No Number of falls in past year: 1 or less Was there an injury with Fall?: No Fall Risk Category Calculator: 0 Patient Fall  Risk Level: Low Fall Risk Patient at Risk for Falls Due to: Impaired mobility Fall risk Follow up: Falls evaluation completed  Psychosocial Psychosocial Symptoms Reported: No symptoms reported Behavioral Management Strategies: Adequate rest, Coping strategies, Support system Behavioral Health Self-Management Outcome: 4 (good) Major Change/Loss/Stressor/Fears (CP): Denies Techniques to Cope with Loss/Stress/Change: Diversional activities, Spiritual practice(s) Quality of Family Relationships: helpful, supportive Do you feel physically threatened by others?: No      07/01/2023    4:15 PM  Depression screen PHQ 2/9  Decreased Interest 0  Down, Depressed, Hopeless 0  PHQ - 2 Score 0    There were no vitals filed for this visit.  Medications Reviewed Today     Reviewed by Arlyce Berger, RN (Registered Nurse) on 07/01/23 at 1614  Med List Status: <None>   Medication Order Taking? Sig Documenting Provider Last Dose Status Informant  alendronate  (FOSAMAX ) 70 MG tablet [id number] Yes TAKE 1 TABLET BY MOUTH WEEKLY  WITH 8 OZ OF PLAIN WATER  30  MINUTES BEFORE FIRST FOOD, DRINK OR MEDS. STAY UPRIGHT FOR 30  MINS Towanda Fret, MD Taking Active   amLODipine -benazepril  (LOTREL) 5-40 MG capsule  [id number]  TAKE 1 CAPSULE BY MOUTH DAILY Towanda Fret, MD  Active   aspirin  EC 81 MG tablet [id number] Yes Take 81 mg by mouth daily. [provider] Taking Active Self  bisacodyl  (DULCOLAX) 5 MG EC tablet [id number] Yes Take 5 mg by mouth daily as needed for moderate constipation. [provider] Taking Active   Blood Glucose Monitoring Suppl (ONE TOUCH ULTRA 2) w/Device KIT [id number] Yes Twice daily testing dx e11.65 Towanda Fret, MD Taking Active Self  Calcium  Carb-Cholecalciferol (CALCIUM  600/VITAMIN D3 PO) [id number] Yes Take 2 tablets by mouth daily. [provider] Taking Active Self  hydrochlorothiazide  (HYDRODIURIL ) 25 MG tablet [id number] Yes TAKE 1 TABLET BY MOUTH DAILY Towanda Fret, MD Taking Active   Incontinence Supply Disposable (DEPEND UNDERWEAR LARGE/XL) MISC [id number] Yes Use for daily incontinence DX R32 Towanda Fret, MD Taking Active   insulin  degludec (TRESIBA  FLEXTOUCH) 100 UNIT/ML FlexTouch Pen [id number] Yes Inject 50 Units into the skin at bedtime. Wendel Hals, NP Taking Active   Insulin  Pen Needle (B-D UF III MINI PEN NEEDLES) 31G X 5 MM MISC [id number] Yes USE FOR TWICE DAILY INJECTIONS  OF HUMALOG  Towanda Fret, MD Taking Active   Lancets [facility]elene Morton PLUS [place]) MISC [id number] Yes USE TWICE DAILY TO CHECK BLOOD  SUGAR Towanda Fret, MD Taking Active   levothyroxine  (SYNTHROID ) 88 MCG tablet [id number] Yes TAKE 1 TABLET BY MOUTH DAILY  BEFORE BREAKFAST Towanda Fret, MD Taking Active   Multiple Vitamin (MULTIVITAMIN) capsule [id number] Yes Take 1 capsule by mouth daily. [provider] Taking Active Self  omeprazole  (PRILOSEC) 20 MG capsule [id number]  TAKE 1 CAPSULE BY MOUTH IN THE  MORNING Towanda Fret, MD  Active   [facility] ULTRA test strip [id number] Yes USE TO CHECK BLOOD SUGAR TWICE  DAILY Towanda Fret, MD Taking Active   polyethylene glycol (MIRALAX  / GLYCOLAX ) 17 g  packet [id number] Yes Take 17 g by mouth as needed. [provider] Taking Active   psyllium (METAMUCIL) 58.6 % packet [id number] Yes Take 1 packet by mouth as needed. [provider] Taking Active   rosuvastatin  (CRESTOR ) 5 MG tablet [id number] Yes Take one tablet by mouth five days per week, Monday though Friday Towanda Fret, MD Taking Active   Semaglutide , 1 MG/DOSE, (OZEMPIC ,  1 MG/DOSE,) 4 MG/3ML SOPN [id number] Yes Inject 1 mg into the skin once a week. Wendel Hals, NP Taking Active   UNABLE TO FIND [id number] Yes Diabetic shoes x 1 pair, Inserts x 3 pair DX E11.9 Towanda Fret, MD Taking Active             Recommendation:   PCP Follow-up Continue to focus on losing 2-3 pounds at a time, Keep your HgA1c at the value your and Dr Verlyn Goad agreed upon, Report any worsening symptoms to your doctors. New pcp RN Cm Grandville Lax & this RN CM will be able to answer questions.   Follow Up Plan:   Patient has met all care management goals. Care Management case will be closed. Patient has been provided contact information should new needs arise.   Othar Curto L. Mcarthur Speedy, RN, BSN, CCM [place]  [facility], [facility] RN Care Manager Direct Dial: [phone]  Fax: [phone]

## 2023-07-14 DIAGNOSIS — M79674 Pain in right toe(s): Secondary | ICD-10-CM | POA: Diagnosis not present

## 2023-07-14 DIAGNOSIS — M79672 Pain in left foot: Secondary | ICD-10-CM | POA: Diagnosis not present

## 2023-07-14 DIAGNOSIS — M79675 Pain in left toe(s): Secondary | ICD-10-CM | POA: Diagnosis not present

## 2023-07-14 DIAGNOSIS — E1151 Type 2 diabetes mellitus with diabetic peripheral angiopathy without gangrene: Secondary | ICD-10-CM | POA: Diagnosis not present

## 2023-07-14 DIAGNOSIS — M79671 Pain in right foot: Secondary | ICD-10-CM | POA: Diagnosis not present

## 2023-07-20 ENCOUNTER — Other Ambulatory Visit: Payer: Self-pay | Admitting: Family Medicine

## 2023-07-21 ENCOUNTER — Other Ambulatory Visit: Payer: Self-pay | Admitting: Family Medicine

## 2023-07-21 DIAGNOSIS — E1165 Type 2 diabetes mellitus with hyperglycemia: Secondary | ICD-10-CM

## 2023-08-23 ENCOUNTER — Other Ambulatory Visit: Payer: Self-pay | Admitting: Family Medicine

## 2023-09-13 DIAGNOSIS — E559 Vitamin D deficiency, unspecified: Secondary | ICD-10-CM | POA: Diagnosis not present

## 2023-09-13 DIAGNOSIS — I10 Essential (primary) hypertension: Secondary | ICD-10-CM | POA: Diagnosis not present

## 2023-09-13 DIAGNOSIS — E782 Mixed hyperlipidemia: Secondary | ICD-10-CM | POA: Diagnosis not present

## 2023-09-14 LAB — CMP14+EGFR
ALT: 15 IU/L (ref 0–32)
AST: 23 IU/L (ref 0–40)
Albumin: 4.3 g/dL (ref 3.8–4.8)
Alkaline Phosphatase: 108 IU/L (ref 44–121)
BUN/Creatinine Ratio: 15 (ref 12–28)
BUN: 18 mg/dL (ref 8–27)
Bilirubin Total: 0.4 mg/dL (ref 0.0–1.2)
CO2: 21 mmol/L (ref 20–29)
Calcium: 9.4 mg/dL (ref 8.7–10.3)
Chloride: 99 mmol/L (ref 96–106)
Creatinine, Ser: 1.18 mg/dL — ABNORMAL HIGH (ref 0.57–1.00)
Globulin, Total: 3.3 g/dL (ref 1.5–4.5)
Glucose: 205 mg/dL — ABNORMAL HIGH (ref 70–99)
Potassium: 4.6 mmol/L (ref 3.5–5.2)
Sodium: 137 mmol/L (ref 134–144)
Total Protein: 7.6 g/dL (ref 6.0–8.5)
eGFR: 47 mL/min/1.73 — ABNORMAL LOW (ref >59)

## 2023-09-14 LAB — CBC
Hematocrit: 38.5 % (ref 34.0–46.6)
Hemoglobin: 12.7 g/dL (ref 11.1–15.9)
MCH: 31.3 pg (ref 26.6–33.0)
MCHC: 33 g/dL (ref 31.5–35.7)
MCV: 95 fL (ref 79–97)
Platelets: 404 x10E3/uL (ref 150–450)
RBC: 4.06 x10E6/uL (ref 3.77–5.28)
RDW: 12.4 % (ref 11.7–15.4)
WBC: 7.8 x10E3/uL (ref 3.4–10.8)

## 2023-09-14 LAB — VITAMIN D 25 HYDROXY (VIT D DEFICIENCY, FRACTURES): Vit D, 25-Hydroxy: 30.8 ng/mL (ref 30.0–100.0)

## 2023-09-14 LAB — LIPID PANEL
Chol/HDL Ratio: 2.8 ratio (ref 0.0–4.4)
Cholesterol, Total: 158 mg/dL (ref 100–199)
HDL: 57 mg/dL (ref >39)
LDL Chol Calc (NIH): 87 mg/dL (ref 0–99)
Triglycerides: 75 mg/dL (ref 0–149)
VLDL Cholesterol Cal: 14 mg/dL (ref 5–40)

## 2023-09-16 ENCOUNTER — Ambulatory Visit: Payer: Medicare Other | Admitting: Family Medicine

## 2023-09-16 ENCOUNTER — Encounter: Payer: Self-pay | Admitting: Family Medicine

## 2023-09-16 ENCOUNTER — Other Ambulatory Visit (HOSPITAL_COMMUNITY): Payer: Self-pay | Admitting: Family Medicine

## 2023-09-16 VITALS — BP 120/80 | HR 102 | Resp 18 | Ht 68.0 in | Wt 196.1 lb

## 2023-09-16 DIAGNOSIS — Z780 Asymptomatic menopausal state: Secondary | ICD-10-CM

## 2023-09-16 DIAGNOSIS — E782 Mixed hyperlipidemia: Secondary | ICD-10-CM

## 2023-09-16 DIAGNOSIS — Z1231 Encounter for screening mammogram for malignant neoplasm of breast: Secondary | ICD-10-CM

## 2023-09-16 DIAGNOSIS — I10 Essential (primary) hypertension: Secondary | ICD-10-CM

## 2023-09-16 DIAGNOSIS — E1159 Type 2 diabetes mellitus with other circulatory complications: Secondary | ICD-10-CM

## 2023-09-16 DIAGNOSIS — F411 Generalized anxiety disorder: Secondary | ICD-10-CM | POA: Diagnosis not present

## 2023-09-16 DIAGNOSIS — F322 Major depressive disorder, single episode, severe without psychotic features: Secondary | ICD-10-CM | POA: Diagnosis not present

## 2023-09-16 DIAGNOSIS — E890 Postprocedural hypothyroidism: Secondary | ICD-10-CM

## 2023-09-16 MED ORDER — SERTRALINE HCL 25 MG PO TABS: 25.0000 mg | ORAL_TABLET | Freq: Every day | ORAL | 1 refills | Status: DC

## 2023-09-16 NOTE — Progress Notes (Signed)
 Stacey Hanson     MRN: [id number]      DOB: [birth year]  Chief Complaint  Patient presents with   Medical Management of Chronic Issues    6 month follow up, Wants to change her insulin      HPI Stacey Hanson is here for follow up and re-evaluation of chronic medical conditions, medication management and review of any available recent lab and radiology data.  Preventive health is updated, specifically  Cancer screening and Immunization.   Questions or concerns regarding consultations or procedures which the PT has had in the interim are  addressed. Pt reports intolerance of ozempic  and Tresiba , wants to resume 70/30 mix, has long list of adverse s/e she is exp[eriencing , also needs mnew testing supplies based on ins preference, Has Endo manage her diabetes and will defer thes concerns to them has appt in next month ROS Denies recent fever or chills. Denies sinus pressure, nasal congestion, ear pain or sore throat. Denies chest congestion, productive cough or wheezing. Denies chest pains, palpitations and leg swelling Denies abdominal pain, nausea, vomiting,diarrhea or constipation.   Denies dysuria, frequency, hesitancy or incontinence. Chronic joint pain, and limitation in mobility. Denies headaches, seizures, numbness, or tingling. C/o  depression, does c/o  anxiety denies  insomnia.Not suicidal or homicidal Denies skin break down or rash.   PE  BP 120/80   Pulse (!) 102   Resp 18   Ht 5' 8 (1.727 m)   Wt 196 lb 1.9 oz (89 kg)   SpO2 96%   BMI 29.82 kg/m   Patient alert and oriented and in no cardiopulmonary distress.  HEENT: No facial asymmetry, EOMI,     Neck supple .  Chest: Clear to auscultation bilaterally.  CVS: S1, S2 no murmurs, no S3.Regular rate.  ABD: Soft non tender.   Ext: No edema  MS: Adequate ROM spine, shoulders, hips and knees.  Skin: Intact, no ulcerations or rash noted.  Psych: Good eye contact, normal affect. Memory intact not anxious or  depressed appearing.  CNS: CN 2-12 intact, power,  normal throughout.no focal deficits noted.   Assessment & Plan  Depression, major, single episode, severe (HCC) Therapy offered not interested . Will start sertraline  25 mg daily and re eval in 6 weeks  GAD (generalized anxiety disorder) Start sertraline  25 mg daily  Type 2 diabetes mellitus with vascular disease (HCC) Diabetes associated with hypertension, hyperlipidemia, arthritis, and depression  Stacey Hanson is reminded of the importance of commitment to daily physical activity for 30 minutes or more, as able and the need to limit carbohydrate intake to 30 to 60 grams per meal to help with blood sugar control.   The need to take medication as prescribed, test blood sugar as directed, and to call between visits if there is a concern that blood sugar is uncontrolled is also discussed.   Stacey Hanson is reminded of the importance of daily foot exam, annual eye examination, and good blood sugar, blood pressure and cholesterol control.     Latest Ref Rng & Units 09/13/2023    9:38 AM 06/02/2023   10:49 AM 05/31/2023   10:00 AM 03/19/2023   11:10 AM 03/18/2023   10:44 AM  Diabetic Labs  HbA1c 4.0 - 5.6 %  8.2      Micro/Creat Ratio 0 - 29 mg/g creat    7    Chol 100 - 199 mg/dL 841       HDL >60 mg/dL 57  Calc LDL 0 - 99 mg/dL 87       Triglycerides 0 - 149 mg/dL 75       Creatinine 9.42 - 1.00 mg/dL 8.81   8.81   8.92       09/16/2023   10:41 AM 09/16/2023   10:09 AM 07/01/2023    4:12 PM 06/02/2023   10:32 AM 03/24/2023   10:45 AM 03/19/2023   10:01 AM 02/01/2023    2:22 PM  BP/Weight  Systolic BP 120 148  124 -- 128 138  Diastolic BP 80 78  78 -- 69 73  Wt. (Lbs)  196.12 200 200.8 203 203.04 207.2  BMI  29.82 kg/m2 31.32 kg/m2 31.45 kg/m2 31.79 kg/m2 31.8 kg/m2 32.45 kg/m2      09/16/2023   10:00 AM 09/15/2022   10:20 AM  Foot/eye exam completion dates  Foot Form Completion Done Done      Managed by Endo, concerns as  ststed  Essential (primary) hypertension Controlled, no change in medication DASH diet and commitment to daily physical activity for a minimum of 30 minutes discussed and encouraged, as a part of hypertension management. The importance of attaining a healthy weight is also discussed.     09/16/2023   10:41 AM 09/16/2023   10:09 AM 07/01/2023    4:12 PM 06/02/2023   10:32 AM 03/24/2023   10:45 AM 03/19/2023   10:01 AM 02/01/2023    2:22 PM  BP/Weight  Systolic BP 120 148  124 -- 128 138  Diastolic BP 80 78  78 -- 69 73  Wt. (Lbs)  196.12 200 200.8 203 203.04 207.2  BMI  29.82 kg/m2 31.32 kg/m2 31.45 kg/m2 31.79 kg/m2 31.8 kg/m2 32.45 kg/m2       Hypothyroidism following radioiodine therapy Upcoming  Endo appt who manages her hypothyroidism  Combined fat and carbohydrate induced hyperlipemia Hyperlipidemia:Low fat diet discussed and encouraged.   Lipid Panel  Lab Results  Component Value Date   CHOL 158 09/13/2023   HDL 57 09/13/2023   LDLCALC 87 09/13/2023   TRIG 75 09/13/2023   CHOLHDL 2.8 09/13/2023     Controlled, no change in medication

## 2023-09-16 NOTE — Assessment & Plan Note (Signed)
 Upcoming  Endo appt who manages her hypothyroidism

## 2023-09-16 NOTE — Patient Instructions (Addendum)
 F/U with MD re evaluate depression in 6 weeks  NEW for depression is sertraline  25 mg one daily, sent to mail order  I will send message to Endo re your desire to change back to 70/30 insulin , you state tresiba  and ozempic  give you multiple bad s/e Discuss with endo at August appointment   Also endo to prescribe new test supplies  Pls schedule mammogram and dexa at checkout   Nurse pls send for diabetic eye exam  done in Feb 2025 , Dr Darroll  Reduced sensation ion both feet, need to wear shoes all the time as you do, and check them every day  Thanks for choosing [facility], we consider it a privelige to serve you.

## 2023-09-16 NOTE — Assessment & Plan Note (Signed)
 Therapy offered not interested . Will start sertraline  25 mg daily and re eval in 6 weeks

## 2023-09-16 NOTE — Assessment & Plan Note (Signed)
 Start sertraline 25 mg daily.

## 2023-09-16 NOTE — Assessment & Plan Note (Signed)
 Diabetes associated with hypertension, hyperlipidemia, arthritis, and depression  Stacey Hanson is reminded of the importance of commitment to daily physical activity for 30 minutes or more, as able and the need to limit carbohydrate intake to 30 to 60 grams per meal to help with blood sugar control.   The need to take medication as prescribed, test blood sugar as directed, and to call between visits if there is a concern that blood sugar is uncontrolled is also discussed.   Stacey Hanson is reminded of the importance of daily foot exam, annual eye examination, and good blood sugar, blood pressure and cholesterol control.     Latest Ref Rng & Units 09/13/2023    9:38 AM 06/02/2023   10:49 AM 05/31/2023   10:00 AM 03/19/2023   11:10 AM 03/18/2023   10:44 AM  Diabetic Labs  HbA1c 4.0 - 5.6 %  8.2      Micro/Creat Ratio 0 - 29 mg/g creat    7    Chol 100 - 199 mg/dL 841       HDL >60 mg/dL 57       Calc LDL 0 - 99 mg/dL 87       Triglycerides 0 - 149 mg/dL 75       Creatinine 9.42 - 1.00 mg/dL 8.81   8.81   8.92       09/16/2023   10:41 AM 09/16/2023   10:09 AM 07/01/2023    4:12 PM 06/02/2023   10:32 AM 03/24/2023   10:45 AM 03/19/2023   10:01 AM 02/01/2023    2:22 PM  BP/Weight  Systolic BP 120 148  124 -- 128 138  Diastolic BP 80 78  78 -- 69 73  Wt. (Lbs)  196.12 200 200.8 203 203.04 207.2  BMI  29.82 kg/m2 31.32 kg/m2 31.45 kg/m2 31.79 kg/m2 31.8 kg/m2 32.45 kg/m2      09/16/2023   10:00 AM 09/15/2022   10:20 AM  Foot/eye exam completion dates  Foot Form Completion Done Done      Managed by Endo, concerns as ststed

## 2023-09-16 NOTE — Assessment & Plan Note (Signed)
 Hyperlipidemia:Low fat diet discussed and encouraged.   Lipid Panel  Lab Results  Component Value Date   CHOL 158 09/13/2023   HDL 57 09/13/2023   LDLCALC 87 09/13/2023   TRIG 75 09/13/2023   CHOLHDL 2.8 09/13/2023     Controlled, no change in medication

## 2023-09-16 NOTE — Assessment & Plan Note (Signed)
 Controlled, no change in medication DASH diet and commitment to daily physical activity for a minimum of 30 minutes discussed and encouraged, as a part of hypertension management. The importance of attaining a healthy weight is also discussed.     09/16/2023   10:41 AM 09/16/2023   10:09 AM 07/01/2023    4:12 PM 06/02/2023   10:32 AM 03/24/2023   10:45 AM 03/19/2023   10:01 AM 02/01/2023    2:22 PM  BP/Weight  Systolic BP 120 148  124 -- 128 138  Diastolic BP 80 78  78 -- 69 73  Wt. (Lbs)  196.12 200 200.8 203 203.04 207.2  BMI  29.82 kg/m2 31.32 kg/m2 31.45 kg/m2 31.79 kg/m2 31.8 kg/m2 32.45 kg/m2

## 2023-09-24 ENCOUNTER — Encounter: Payer: Medicare Other | Admitting: *Deleted

## 2023-09-29 DIAGNOSIS — M79675 Pain in left toe(s): Secondary | ICD-10-CM | POA: Diagnosis not present

## 2023-09-29 DIAGNOSIS — E1151 Type 2 diabetes mellitus with diabetic peripheral angiopathy without gangrene: Secondary | ICD-10-CM | POA: Diagnosis not present

## 2023-09-29 DIAGNOSIS — M79674 Pain in right toe(s): Secondary | ICD-10-CM | POA: Diagnosis not present

## 2023-09-29 DIAGNOSIS — M79672 Pain in left foot: Secondary | ICD-10-CM | POA: Diagnosis not present

## 2023-09-29 DIAGNOSIS — M79671 Pain in right foot: Secondary | ICD-10-CM | POA: Diagnosis not present

## 2023-10-06 ENCOUNTER — Telehealth: Payer: Self-pay | Admitting: Nurse Practitioner

## 2023-10-06 ENCOUNTER — Encounter: Payer: Self-pay | Admitting: Nurse Practitioner

## 2023-10-06 ENCOUNTER — Ambulatory Visit: Admitting: Nurse Practitioner

## 2023-10-06 VITALS — BP 136/74 | HR 80 | Ht 68.0 in | Wt 199.4 lb

## 2023-10-06 DIAGNOSIS — E1165 Type 2 diabetes mellitus with hyperglycemia: Secondary | ICD-10-CM

## 2023-10-06 DIAGNOSIS — E782 Mixed hyperlipidemia: Secondary | ICD-10-CM

## 2023-10-06 DIAGNOSIS — Z7984 Long term (current) use of oral hypoglycemic drugs: Secondary | ICD-10-CM

## 2023-10-06 DIAGNOSIS — I10 Essential (primary) hypertension: Secondary | ICD-10-CM | POA: Diagnosis not present

## 2023-10-06 DIAGNOSIS — Z794 Long term (current) use of insulin: Secondary | ICD-10-CM

## 2023-10-06 DIAGNOSIS — Z7985 Long-term (current) use of injectable non-insulin antidiabetic drugs: Secondary | ICD-10-CM

## 2023-10-06 DIAGNOSIS — E890 Postprocedural hypothyroidism: Secondary | ICD-10-CM | POA: Diagnosis not present

## 2023-10-06 LAB — POCT GLYCOSYLATED HEMOGLOBIN (HGB A1C): Hemoglobin A1C: 8.9 % — AB (ref 4.0–5.6)

## 2023-10-06 MED ORDER — ACCU-CHEK SOFTCLIX LANCETS MISC: 12 refills | Status: AC

## 2023-10-06 MED ORDER — LEVOTHYROXINE SODIUM 88 MCG PO TABS: 88.0000 ug | ORAL_TABLET | Freq: Every day | ORAL | 3 refills | Status: AC

## 2023-10-06 MED ORDER — ACCU-CHEK GUIDE ME W/DEVICE KIT: PACK | 0 refills | Status: AC

## 2023-10-06 MED ORDER — HUMULIN 70/30 KWIKPEN (70-30) 100 UNIT/ML ~~LOC~~ SUPN: 25.0000 [IU] | PEN_INJECTOR | Freq: Two times a day (BID) | SUBCUTANEOUS | 3 refills | Status: DC

## 2023-10-06 MED ORDER — BD PEN NEEDLE MINI ULTRAFINE 31G X 5 MM MISC: 3 refills | Status: AC

## 2023-10-06 MED ORDER — ACCU-CHEK GUIDE TEST VI STRP: ORAL_STRIP | 12 refills | Status: AC

## 2023-10-06 NOTE — Telephone Encounter (Signed)
 Forwarding this message to you to review.

## 2023-10-06 NOTE — Telephone Encounter (Signed)
 Copied from CRM #[id number]. Topic: Clinical - Medical Advice >> Oct 06, 2023 10:54 AM [place] B wrote: Reason for CRM: Patient called states she doesnt want to take the derpression med , sertraline  and says she isnt gonna take it because of the side effects

## 2023-10-06 NOTE — Progress Notes (Signed)
 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 pylori infection 09/24/2010   Hyperlipidemia    Hypertension    Hypothyroidism    IDDM (insulin  dependent diabetes mellitus) 1987   type 2    Knee pain    Obesity    Past Surgical History:  Procedure Laterality Date   ABDOMINAL HYSTERECTOMY  03/02/1968   CATARACT EXTRACTION W/PHACO Left 03/09/2019   Procedure: CATARACT EXTRACTION PHACO AND INTRAOCULAR LENS PLACEMENT (IOC);  Surgeon: Harrie Agent, MD;  Location: [facility];  Service: Ophthalmology;  Laterality: Left;  CDE: 9.08   CATARACT EXTRACTION W/PHACO Right 03/27/2019   Procedure: CATARACT EXTRACTION PHACO AND INTRAOCULAR LENS PLACEMENT RIGHT EYE;  Surgeon: Harrie Agent, MD;  Location: [facility];  Service: Ophthalmology;  Laterality: Right;  CDE: 7.47   CHOLECYSTECTOMY  OCT 2010 BZ BILIARY DYSKINESIA   CHRONIC CHOLECYSTITIS   COLONOSCOPY  MAY 2009 SCREENING   pTC TICS, [facility]H   COLONOSCOPY N/A 08/11/2017   Procedure: COLONOSCOPY;  Surgeon: Harvey Margo CROME, MD;  Location: [facility];  Service: Endoscopy;  Laterality: N/A;  9:30am   ESOPHAGOGASTRODUODENOSCOPY  10/03/2010   Procedure: ESOPHAGOGASTRODUODENOSCOPY (EGD);  Surgeon: Margo CHRISTELLA Harvey, MD;  Location: [facility];  Service: Endoscopy;  Laterality: N/A;   ESOPHAGOGASTRODUODENOSCOPY  12/19/2002   RMR: Normal esophagus/Normal stomach/ Duodenum large bulbar diverticulum, 1 cm bulbar ulcer with surrounding/ inflammation as described above. Normal D2   FOOT SURGERY Left 01/29/2021   POLYPECTOMY  08/11/2017   Procedure: POLYPECTOMY;  Surgeon: Harvey Margo CROME, MD;  Location: [facility];  Service: Endoscopy;;  cecal     SAVORY DILATION  10/03/2010   Procedure: SAVORY DILATION;  Surgeon: Margo CHRISTELLA Harvey, MD;  Location: [facility];  Service: Endoscopy;;   TOTAL ABDOMINAL HYSTERECTOMY  03/02/1968   TOTAL KNEE ARTHROPLASTY Left 12/21/2017   Procedure: LEFT TOTAL KNEE ARTHROPLASTY;  Surgeon: Ernie Cough, MD;  Location: [facility];  Service: Orthopedics;  Laterality: Left;  70 mins block   UPPER GASTROINTESTINAL ENDOSCOPY  OCT 2004 RMR   DUO TIC, & ULCER   Social History   Socioeconomic History   Marital status: Single    Spouse name: Not on file   Number of children: 3   Years of education: 10   Highest education level: 10th grade  Occupational History   Occupation: [employer]-OR   Occupation: Retired   Tobacco Use   Smoking status: Former    Current packs/day: 0.00    Average packs/day: 0.3 packs/day for 10.0 years (2.5 ttl pk-yrs)    Types: Cigarettes    Start date: 02/21/1975    Quit date: 02/20/1985    Years since quitting: 38.6   Smokeless tobacco: Never  Vaping Use   Vaping status: Never Used  Substance and Sexual Activity   Alcohol use: No    Comment: stopped drinking 1998   Drug use: No   Sexual activity: Not Currently  Other Topics Concern   Not on file  Social History Narrative   Lives alone and 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   Social Drivers of Corporate investment banker  Strain: Low Risk  (07/01/2023)   Overall Financial Resource Strain (CARDIA)    Difficulty of Paying Living Expenses: Not hard at all  Food Insecurity: No Food Insecurity (07/01/2023)   Hunger Vital Sign    Worried About Running Out of Food in the Last Year: Never true    Ran Out of Food in the Last Year: Never true  Transportation Needs: No Transportation Needs (07/01/2023)   PRAPARE - Administrator, Civil Service (Medical): No    Lack of Transportation (Non-Medical): No  Physical Activity: Sufficiently Active (03/24/2023)   Exercise Vital Sign    Days of Exercise per Week: 7 days    Minutes  of Exercise per Session: 30 min  Stress: No Stress Concern Present (07/01/2023)   [employer] of Occupational Health - Occupational Stress Questionnaire    Feeling of Stress : Not at all  Social Connections: Moderately Integrated (07/01/2023)   Social Connection and Isolation Panel    Frequency of Communication with Friends and Family: More than three times a week    Frequency of Social Gatherings with Friends and Family: More than three times a week    Attends Religious Services: More than 4 times per year    Active Member of [employer] or Organizations: Yes    Attends Banker Meetings: More than 4 times per year    Marital Status: Never married   Outpatient Encounter Medications as of 10/06/2023  Medication Sig   Accu-Chek Softclix Lancets lancets Use as instructed to monitor glucose 3 times daily   amLODipine -benazepril  (LOTREL) 5-40 MG capsule TAKE 1 CAPSULE BY MOUTH DAILY   aspirin  EC 81 MG tablet Take 81 mg by mouth daily.   bisacodyl  (DULCOLAX) 5 MG EC tablet Take 5 mg by mouth daily as needed for moderate constipation.   Blood Glucose Monitoring Suppl (ACCU-CHEK GUIDE ME) w/Device KIT Use to check glucose 3 times daily.   Blood Glucose Monitoring Suppl (ONE TOUCH ULTRA 2) w/Device KIT Twice daily testing dx e11.65   Calcium  Carb-Cholecalciferol (CALCIUM  600/VITAMIN D3 PO) Take 2 tablets by mouth daily.   glucose blood (ACCU-CHEK GUIDE TEST) test strip Use as instructed to monitor glucose 3 times daily   hydrochlorothiazide  (HYDRODIURIL ) 25 MG tablet TAKE 1 TABLET BY MOUTH DAILY   insulin  isophane & regular human KwikPen (HUMULIN  70/30 KWIKPEN) (70-30) 100 UNIT/ML KwikPen Inject 25 Units into the skin 2 (two) times daily before a meal.   Lancets (ONETOUCH DELICA PLUS LANCET33G) MISC USE TWICE DAILY TO CHECK BLOOD  SUGAR   Multiple Vitamin (MULTIVITAMIN) capsule Take 1 capsule by mouth daily.   omeprazole  (PRILOSEC) 20 MG capsule TAKE 1 CAPSULE BY MOUTH IN THE  MORNING    rosuvastatin  (CRESTOR ) 5 MG tablet TAKE 1 TABLET BY MOUTH 5 DAYS  PER WEEK , MONDAY THOUGH FRIDAY   sertraline  (ZOLOFT ) 25 MG tablet Take 1 tablet (25 mg total) by mouth daily.   UNABLE TO FIND Diabetic shoes x 1 pair, Inserts x 3 pair DX E11.9   [DISCONTINUED] insulin  degludec (TRESIBA  FLEXTOUCH) 100 UNIT/ML FlexTouch Pen Inject 50 Units into the skin at bedtime.   [DISCONTINUED] Insulin  Pen Needle (BD PEN NEEDLE MINI ULTRAFINE) 31G X 5 MM MISC USE FOR TWICE DAILY INJECTIONS  OF HUMALOG    [DISCONTINUED] levothyroxine  (SYNTHROID ) 88 MCG tablet TAKE 1 TABLET BY MOUTH DAILY  BEFORE BREAKFAST   [DISCONTINUED] ONETOUCH ULTRA test strip USE TO CHECK BLOOD SUGAR TWICE  DAILY   [DISCONTINUED] Semaglutide , 1 MG/DOSE, (OZEMPIC ,  1 MG/DOSE,) 4 MG/3ML SOPN Inject 1 mg into the skin once a week.   alendronate  (FOSAMAX ) 70 MG tablet TAKE 1 TABLET BY MOUTH WEEKLY  WITH 8 OZ OF PLAIN WATER  30  MINUTES BEFORE FIRST FOOD, DRINK OR MEDS. STAY UPRIGHT FOR 30  MINS (Patient not taking: Reported on 09/16/2023)   Incontinence Supply Disposable (DEPEND UNDERWEAR LARGE/XL) MISC Use for daily incontinence DX R32 (Patient not taking: Reported on 10/06/2023)   Insulin  Pen Needle (BD PEN NEEDLE MINI ULTRAFINE) 31G X 5 MM MISC Use to inject insulin  twice daily   levothyroxine  (SYNTHROID ) 88 MCG tablet Take 1 tablet (88 mcg total) by mouth daily before breakfast.   polyethylene glycol (MIRALAX  / GLYCOLAX ) 17 g packet Take 17 g by mouth as needed. (Patient not taking: Reported on 10/06/2023)   psyllium (METAMUCIL) 58.6 % packet Take 1 packet by mouth as needed. (Patient not taking: Reported on 10/06/2023)   No facility-administered encounter medications on file as of 10/06/2023.   ALLERGIES: Allergies  Allergen Reactions   Pravastatin  Other (See Comments)    PT is currently taking but states she cramps   Simvastatin Other (See Comments)    Muscle cramps Other reaction(s): Not available   VACCINATION STATUS: Immunization History   Administered Date(s) Administered    sv, Bivalent, Protein Subunit Rsvpref,pf [place]) 04/18/2022   Fluad Quad(high Dose 65+) 11/23/2018, 12/21/2019, 12/10/2020, 11/12/2021, 11/23/2022   Influenza,inj,Quad PF,6+ Mos 11/16/2012, 11/15/2013, 11/14/2014, 11/14/2015, 11/03/2016, 11/09/2017   Influenza,inj,quad, With Preservative 11/02/2016   MODERNA COVID-19 SARS-COV-2 PEDS BIVALENT BOOSTER 19yr-66yr 11/23/2022   Moderna SARS-COV2 Booster Vaccination 07/24/2020, 05/26/2021   Moderna Sars-Covid-2 Vaccination 04/27/2019, 05/26/2019, 01/08/2020   Pneumococcal Conjugate-13 10/11/2013   Pneumococcal Polysaccharide-23 10/07/2011   Tdap 12/16/2020   Zoster Recombinant(Shingrix) 12/16/2020   Zoster, Live 04/05/2006   Zoster, Unspecified 06/16/2021    Diabetes She presents for her follow-up diabetic visit. She has type 2 diabetes mellitus. Onset time: She was diagnosed at approximate age of 40 years. Her disease course has been fluctuating. Pertinent negatives for hypoglycemia include no confusion, nervousness/anxiousness or tremors. Pertinent negatives for diabetes include no fatigue, no foot paresthesias, no polydipsia, no polyphagia, no polyuria and no weight loss. There are no hypoglycemic complications. Symptoms are stable. Diabetic complications include nephropathy. Risk factors for coronary artery disease include dyslipidemia, diabetes mellitus, obesity, hypertension, sedentary lifestyle, post-menopausal and tobacco exposure. Current diabetic treatment includes insulin  injections and oral agent (monotherapy) (and Ozempic ). She is compliant with treatment most of the time. Her weight is fluctuating minimally. She is following a generally unhealthy diet. When asked about meal planning, she reported none. She has not had a previous visit with a dietitian. She rarely participates in exercise. Her home blood glucose trend is fluctuating minimally. Her breakfast blood glucose range is generally 110-130 mg/dl.  Her dinner blood glucose range is generally 180-200 mg/dl. (She presents today with her meter and logs showing at target fasting and above target postprandial readings.  Her POCT A1c today is 8.9%, increasing from last visit of 8.2%.  Analysis of her meter shows 7-day average of 143, 14-day average of 179, 30-day average of 187.  She denies any significant hypoglycemia.  She wants to switch back to her premixed insulin  due to side effects of her current regimen.) An ACE inhibitor/angiotensin II receptor blocker is being taken. She does not see a podiatrist.Eye exam is current.  Thyroid  Problem Presents for follow-up (-She is also status post radioactive iodine  treatment for toxic nodule on October 17, 2010.  She has had  history of multinodular goiter status post fine-needle aspiration with benign findings.) visit. Patient reports no anxiety, cold intolerance, constipation, depressed mood, diarrhea, fatigue, heat intolerance, leg swelling, tremors, weight gain or weight loss. The symptoms have been stable.     Review of systems  Constitutional: + stable body weight,  current Body mass index is 30.32 kg/m. , + fatigue, no subjective hyperthermia, no subjective hypothermia Eyes: no blurry vision, no xerophthalmia ENT: no sore throat, no nodules palpated in throat, no dysphagia/odynophagia, no hoarseness Cardiovascular: no chest pain, no shortness of breath, no palpitations, no leg swelling Respiratory: no cough, no shortness of breath Gastrointestinal: no nausea/vomiting/diarrhea, + constipation, intermittent nausea, acid reflux (since starting Ozempic ) Musculoskeletal: + diffuse muscle/joint aches Skin: no rashes, no hyperemia Neurological: no tremors, no numbness, no tingling, no dizziness Psychiatric: no depression, no anxiety    Objective:    BP 136/74 (BP Location: Right Arm, Patient Position: Sitting, Cuff Size: Large)   Pulse 80   Ht 5' 8 (1.727 m)   Wt 199 lb 6.4 oz (90.4 kg)   BMI  30.32 kg/m   Wt Readings from Last 3 Encounters:  10/06/23 199 lb 6.4 oz (90.4 kg)  09/16/23 196 lb 1.9 oz (89 kg)  07/01/23 200 lb (90.7 kg)    BP Readings from Last 3 Encounters:  10/06/23 136/74  09/16/23 120/80  06/02/23 124/78    Physical Exam- Limited  Constitutional:  Body mass index is 30.32 kg/m. , not in acute distress, normal state of mind, ? Memory deficits Eyes:  EOMI, no exophthalmos Musculoskeletal: no gross deformities, strength intact in all four extremities, no gross restriction of joint movements Skin:  no rashes, no hyperemia Neurological: no tremor with outstretched hands    CMP     Component Value Date/Time   NA 137 09/13/2023 0938   K 4.6 09/13/2023 0938   CL 99 09/13/2023 0938   CO2 21 09/13/2023 0938   GLUCOSE 205 (H) 09/13/2023 0938   GLUCOSE 395 (H) 12/28/2020 2201   BUN 18 09/13/2023 0938   CREATININE 1.18 (H) 09/13/2023 0938   CREATININE 0.96 (H) 03/12/2016 0925   CALCIUM  9.4 09/13/2023 0938   PROT 7.6 09/13/2023 0938   ALBUMIN 4.3 09/13/2023 0938   AST 23 09/13/2023 0938   ALT 15 09/13/2023 0938   ALKPHOS 108 09/13/2023 0938   BILITOT 0.4 09/13/2023 0938   GFRNONAA 40 (L) 12/28/2020 2201   GFRNONAA 60 03/12/2016 0925   GFRAA 48 (L) 04/11/2020 1007   GFRAA 69 03/12/2016 0925     Diabetic Labs (most recent): Lab Results  Component Value Date   HGBA1C 8.9 (A) 10/06/2023   HGBA1C 8.2 (A) 06/02/2023   HGBA1C 8.1 (A) 02/01/2023   MICROALBUR 10 04/19/2020   MICROALBUR 0.4 03/12/2016   MICROALBUR 0.2 02/19/2014     Lipid Panel ( most recent) Lipid Panel     Component Value Date/Time   CHOL 158 09/13/2023 0938   TRIG 75 09/13/2023 0938   HDL 57 09/13/2023 0938   CHOLHDL 2.8 09/13/2023 0938   CHOLHDL 2.2 01/11/2016 0916   VLDL 11 01/11/2016 0916   LDLCALC 87 09/13/2023 0938    Assessment & Plan:   1) Uncontrolled type 2 diabetes mellitus with stage 3a chronic kidney disease, with long-term current use of insulin   (HCC)  She presents today with her meter and logs showing at target fasting and above target postprandial readings.  Her POCT A1c today is 8.9%, increasing from last  visit of 8.2%.  Analysis of her meter shows 7-day average of 143, 14-day average of 179, 30-day average of 187.  She denies any significant hypoglycemia.  She wants to switch back to her premixed insulin  due to side effects of her current regimen.  - Nutritional counseling repeated at each appointment due to patients tendency to fall back in to old habits.  - The patient admits there is a room for improvement in their diet and drink choices. -  Suggestion is made for the patient to avoid simple carbohydrates from their diet including Cakes, Sweet Desserts / Pastries, Ice Cream, Soda (diet and regular), Sweet Tea, Candies, Chips, Cookies, Sweet Pastries, Store Bought Juices, Alcohol in Excess of 1-2 drinks a day, Artificial Sweeteners, Coffee Creamer, and Sugar-free Products. This will help patient to have stable blood glucose profile and potentially avoid unintended weight gain.   - I encouraged the patient to switch to unprocessed or minimally processed complex starch and increased protein intake (animal or plant source), fruits, and vegetables.   - Patient is advised to stick to a routine mealtimes to eat 3 meals a day and avoid unnecessary snacks (to snack only to correct hypoglycemia).  -Will change her back to premixed 70/30 insulin  starting at 25 units SQ BID before meals if glucose is above 90 and she is eating.  She is advised to STOP the Tresiba  and Ozempic .  -She is advised to continue monitoring blood glucose 3 times daily, before breakfast, before supper and before bed, and to call the clinic if she has readings less than 70 or above 200 for 3 tests in a row.  She does not want a CGM, has tried it and does not like it.  2) Hypothyroidism- s/p RAI There are no recent TFTs to review.  She is advised to continue  Levothyroxine  88 mcg po daily before breakfast. Will recheck TFTs prior to next visit and adjust dose accordingly.   - We discussed about the correct intake of her thyroid  hormone, on empty stomach at fasting, with water , separated by at least 30 minutes from breakfast and other medications,  and separated by more than 4 hours from calcium , iron, multivitamins, acid reflux medications (PPIs). -Patient is made aware of the fact that thyroid  hormone replacement is needed for life, dose to be adjusted by periodic monitoring of thyroid  function tests.  3) Weight management:  Her Body mass index is 30.32 kg/m.-a candidate for modest weight loss.  Patient specific carbs restrictions and exercise regimen discussed with her.  4) Blood pressure/ Hypertension: -Her blood pressure is controlled to target.  She is advised to continue meds as prescribed by PCP.    5) Hyperlipidemia: Her recent lipid panel from 09/13/23 showed controlled LDL of 87.  PCP currently managing this.  - I advised patient to maintain close follow up with Antonetta Rollene BRAVO, MD for diabetes and all other primary care needs.     I spent  32  minutes in the care of the patient today including review of labs from CMP, Lipids, Thyroid  Function, Hematology (current and previous including abstractions from other facilities); face-to-face time discussing  her blood glucose readings/logs, discussing hypoglycemia and hyperglycemia episodes and symptoms, medications doses, her options of short and long term treatment based on the latest standards of care / guidelines;  discussion about incorporating lifestyle medicine;  and documenting the encounter. Risk reduction counseling performed per USPSTF guidelines to reduce obesity and cardiovascular risk factors.     Please refer  to Patient Instructions for Blood Glucose Monitoring and Insulin /Medications Dosing Guide  in media tab for additional information. Please  also refer to  Patient Self  Inventory in the Media  tab for reviewed elements of pertinent patient history.  Jonni Naclerio participated in the discussions, expressed understanding, and voiced agreement with the above plans.  All questions were answered to her satisfaction. she is encouraged to contact clinic should she have any questions or concerns prior to her return visit.    Follow up plan: Return in about 3 months (around 01/06/2024) for Diabetes F/U with A1c in office, Thyroid  follow up, Previsit labs, Bring meter and logs.   Benton Rio, [facility] [address] [place], [place] [address] Phone: [phone] Fax: [phone]  10/06/2023, 9:22 AM

## 2023-10-06 NOTE — Patient Instructions (Signed)

## 2023-10-06 NOTE — Telephone Encounter (Signed)
 Spoke to patient and rescheduled.

## 2023-10-25 ENCOUNTER — Ambulatory Visit (INDEPENDENT_AMBULATORY_CARE_PROVIDER_SITE_OTHER): Payer: Medicare Other | Admitting: Audiology

## 2023-10-25 ENCOUNTER — Ambulatory Visit (INDEPENDENT_AMBULATORY_CARE_PROVIDER_SITE_OTHER): Payer: Medicare Other | Admitting: Physician Assistant

## 2023-10-28 ENCOUNTER — Ambulatory Visit: Admitting: Family Medicine

## 2023-12-02 ENCOUNTER — Other Ambulatory Visit: Payer: Self-pay

## 2023-12-02 ENCOUNTER — Ambulatory Visit

## 2023-12-02 ENCOUNTER — Encounter: Payer: Self-pay | Admitting: Emergency Medicine

## 2023-12-02 ENCOUNTER — Ambulatory Visit
Admission: EM | Admit: 2023-12-02 | Discharge: 2023-12-02 | Disposition: A | Discharge status: Home / Self Care | Attending: Nurse Practitioner | Admitting: Nurse Practitioner

## 2023-12-02 DIAGNOSIS — M25511 Pain in right shoulder: Secondary | ICD-10-CM | POA: Diagnosis not present

## 2023-12-02 DIAGNOSIS — M19011 Primary osteoarthritis, right shoulder: Secondary | ICD-10-CM | POA: Diagnosis not present

## 2023-12-02 DIAGNOSIS — M85811 Other specified disorders of bone density and structure, right shoulder: Secondary | ICD-10-CM | POA: Diagnosis not present

## 2023-12-02 MED ORDER — LIDOCAINE 5 % EX PTCH: 1.0000 | MEDICATED_PATCH | CUTANEOUS | 0 refills | Status: DC

## 2023-12-02 NOTE — ED Provider Notes (Signed)
 [facility]    CSN: [id number] Arrival date & time: 12/02/23  1042      History   Chief Complaint Chief Complaint  Patient presents with   Arm Pain    HPI Stacey Hanson is a 78 y.o. female.   The history is provided by the patient.   Patient presents for complaints of right shoulder pain has been present for the past 2 to 3 weeks.  Patient denies any recent injury or trauma.  States that she has pain when she is trying to raise her right arm above her head.  She also states that the pain sometimes radiates into the right upper arm and right neck.  She states that she has fallen several times over the past several weeks.  She states on 1 occasion, she fell directly onto her right shoulder/upper arm.  She denies bruising, redness, or swelling.  States that she is right-hand dominant.  So far, states she has been taking Tylenol  and using an arthritis cream with minimal relief of her symptoms.  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 pylori infection 09/24/2010   Hyperlipidemia    Hypertension    Hypothyroidism    IDDM (insulin  dependent diabetes mellitus) 1987   type 2    Knee pain    Obesity     Patient Active Problem List   Diagnosis Date Noted   Depression, major, single episode, severe (HCC) 09/16/2023   GAD (generalized anxiety disorder) 09/16/2023   Incontinence of urine in female 05/18/2023   Hearing loss 03/23/2023   Bilateral impacted cerumen 03/23/2023   Sebaceous cyst 03/18/2022   Encounter for Medicare annual examination with abnormal findings 03/17/2022   Lesion of oral mucosa 03/17/2022   Chronic hip pain, left 06/26/2020   Type 2 diabetes mellitus with vascular disease (HCC) 05/29/2019   Presence of left artificial knee joint 03/15/2018   S/P total knee replacement  12/21/2017   Need for immunization against influenza 11/14/2017   Pain in right knee 07/14/2017   Back pain 04/30/2015   Seasonal allergies 02/21/2015   Osteopenia 06/20/2013   Hypothyroidism following radioiodine therapy 12/28/2010   Pure hypercholesterolemia 09/25/2010   Gastroesophageal reflux disease 09/22/2010   Osteoarthritis 04/14/2007   Combined fat and carbohydrate induced hyperlipemia 03/31/2007   Obesity 03/31/2007   Essential (primary) hypertension 03/31/2007    Past Surgical History:  Procedure Laterality Date   ABDOMINAL HYSTERECTOMY  03/02/1968   CATARACT EXTRACTION W/PHACO Left 03/09/2019   Procedure: CATARACT EXTRACTION PHACO AND INTRAOCULAR LENS PLACEMENT (IOC);  Surgeon: Harrie Agent, MD;  Location: [facility];  Service: Ophthalmology;  Laterality: Left;  CDE: 9.08   CATARACT EXTRACTION W/PHACO Right 03/27/2019   Procedure: CATARACT EXTRACTION PHACO AND INTRAOCULAR LENS PLACEMENT RIGHT EYE;  Surgeon: Harrie Agent, MD;  Location: [facility];  Service: Ophthalmology;  Laterality: Right;  CDE: 7.47   CHOLECYSTECTOMY  OCT 2010 BZ BILIARY DYSKINESIA   CHRONIC CHOLECYSTITIS   COLONOSCOPY  MAY 2009 SCREENING   pTC TICS, [facility]   COLONOSCOPY N/A 08/11/2017   Procedure: COLONOSCOPY;  Surgeon: Harvey Margo CROME, MD;  Location: [facility];  Service: Endoscopy;  Laterality: N/A;  9:30am   ESOPHAGOGASTRODUODENOSCOPY  10/03/2010   Procedure: ESOPHAGOGASTRODUODENOSCOPY (EGD);  Surgeon: Margo CHRISTELLA Harvey, MD;  Location:  [facility];  Service: Endoscopy;  Laterality: N/A;   ESOPHAGOGASTRODUODENOSCOPY  12/19/2002   RMR: Normal esophagus/Normal stomach/ Duodenum large bulbar diverticulum, 1 cm bulbar ulcer with surrounding/ inflammation as described above. Normal D2   FOOT SURGERY Left 01/29/2021   POLYPECTOMY  08/11/2017   Procedure: POLYPECTOMY;  Surgeon: Harvey Margo CROME, MD;  Location: [facility];  Service: Endoscopy;;  cecal    SAVORY DILATION  10/03/2010   Procedure: SAVORY  DILATION;  Surgeon: Margo CHRISTELLA Harvey, MD;  Location: [facility];  Service: Endoscopy;;   TOTAL ABDOMINAL HYSTERECTOMY  03/02/1968   TOTAL KNEE ARTHROPLASTY Left 12/21/2017   Procedure: LEFT TOTAL KNEE ARTHROPLASTY;  Surgeon: Ernie Cough, MD;  Location: [facility];  Service: Orthopedics;  Laterality: Left;  70 mins block   UPPER GASTROINTESTINAL ENDOSCOPY  OCT 2004 RMR   DUO TIC, & ULCER    OB History   No obstetric history on file.      Home Medications    Prior to Admission medications   Medication Sig Start Date End Date Taking? Authorizing Provider  lidocaine  (LIDODERM ) 5 % Place 1 patch onto the skin daily. Remove & Discard patch within 12 hours or as directed by MD 12/02/23  Yes Leath-Warren, Etta PARAS, NP  Accu-Chek Softclix Lancets lancets Use as instructed to monitor glucose 3 times daily 10/06/23   Therisa Benton PARAS, NP  alendronate  (FOSAMAX ) 70 MG tablet TAKE 1 TABLET BY MOUTH WEEKLY  WITH 8 OZ OF PLAIN WATER  30  MINUTES BEFORE FIRST FOOD, DRINK OR MEDS. STAY UPRIGHT FOR 30  MINS Patient not taking: Reported on 09/16/2023 03/06/22   Antonetta Rollene BRAVO, MD  amLODipine -benazepril  (LOTREL) 5-40 MG capsule TAKE 1 CAPSULE BY MOUTH DAILY 07/21/23   Antonetta Rollene BRAVO, MD  aspirin  EC 81 MG tablet Take 81 mg by mouth daily.    [provider]  bisacodyl  (DULCOLAX) 5 MG EC tablet Take 5 mg by mouth daily as needed for moderate constipation.    [provider]  Blood Glucose Monitoring Suppl (ACCU-CHEK GUIDE ME) w/Device KIT Use to check glucose 3 times daily. 10/06/23   Therisa Benton PARAS, NP  Blood Glucose Monitoring Suppl (ONE TOUCH ULTRA 2) w/Device KIT Twice daily testing dx e11.65 12/27/18   Antonetta Rollene BRAVO, MD  Calcium  Carb-Cholecalciferol (CALCIUM  600/VITAMIN D3 PO) Take 2 tablets by mouth daily.    [provider]  glucose blood (ACCU-CHEK GUIDE TEST) test strip Use as instructed to monitor glucose 3 times daily 10/06/23   Therisa Benton PARAS, NP   hydrochlorothiazide  (HYDRODIURIL ) 25 MG tablet TAKE 1 TABLET BY MOUTH DAILY 05/31/23   Antonetta Rollene BRAVO, MD  Incontinence Supply Disposable (DEPEND UNDERWEAR LARGE/XL) MISC Use for daily incontinence DX R32 Patient not taking: Reported on 10/06/2023 05/17/23   Antonetta Rollene BRAVO, MD  insulin  isophane & regular human KwikPen (HUMULIN  70/30 KWIKPEN) (70-30) 100 UNIT/ML KwikPen Inject 25 Units into the skin 2 (two) times daily before a meal. 10/06/23   Therisa Benton PARAS, NP  Insulin  Pen Needle (BD PEN NEEDLE MINI ULTRAFINE) 31G X 5 MM MISC Use to inject insulin  twice daily 10/06/23   Therisa Benton PARAS, NP  Lancets (ONETOUCH DELICA PLUS LANCET33G) MISC USE TWICE DAILY TO CHECK BLOOD  SUGAR 07/21/23   Antonetta Rollene BRAVO, MD  levothyroxine  (SYNTHROID ) 88 MCG tablet Take 1 tablet (88 mcg total) by mouth daily before breakfast. 10/06/23   Therisa Benton PARAS, NP  Multiple Vitamin (MULTIVITAMIN) capsule Take 1 capsule by mouth  daily.    [provider]  omeprazole  (PRILOSEC) 20 MG capsule TAKE 1 CAPSULE BY MOUTH IN THE  MORNING 07/21/23   Antonetta Rollene BRAVO, MD  polyethylene glycol (MIRALAX  / GLYCOLAX ) 17 g packet Take 17 g by mouth as needed. Patient not taking: Reported on 10/06/2023    [provider]  psyllium (METAMUCIL) 58.6 % packet Take 1 packet by mouth as needed. Patient not taking: Reported on 10/06/2023    [provider]  rosuvastatin  (CRESTOR ) 5 MG tablet TAKE 1 TABLET BY MOUTH 5 DAYS  PER WEEK , MONDAY THOUGH FRIDAY 07/20/23   Antonetta Rollene BRAVO, MD  sertraline  (ZOLOFT ) 25 MG tablet Take 1 tablet (25 mg total) by mouth daily. 09/16/23   Antonetta Rollene BRAVO, MD  UNABLE TO FIND Diabetic shoes x 1 pair, Inserts x 3 pair DX E11.9 03/06/20   Antonetta Rollene BRAVO, MD    Family History Family History  Problem Relation Age of Onset   Diabetes Mother    Hypertension Mother    Heart failure Mother    Cancer Father 52       prostate and colon   Diabetes Sister    Hypertension  Sister    Hypertension Sister    Diabetes Sister    Diabetes Brother    Hypertension Brother    Diabetes Brother    Hypertension Brother    Coronary artery disease Brother 55       MI   Alcohol abuse Son    Colon cancer Neg Hx    Colon polyps Neg Hx     Social History Social History   Tobacco Use   Smoking status: Former    Current packs/day: 0.00    Average packs/day: 0.3 packs/day for 10.0 years (2.5 ttl pk-yrs)    Types: Cigarettes    Start date: 02/21/1975    Quit date: 02/20/1985    Years since quitting: 38.8   Smokeless tobacco: Never  Vaping Use   Vaping status: Never Used  Substance Use Topics   Alcohol use: No    Comment: stopped drinking 1998   Drug use: No     Allergies   Pravastatin  and Simvastatin   Review of Systems Review of Systems Per HPI  Physical Exam Triage Vital Signs ED Triage Vitals  Encounter Vitals Group     BP 12/02/23 1051 (!) 146/70     Girls Systolic BP Percentile --      Girls Diastolic BP Percentile --      Boys Systolic BP Percentile --      Boys Diastolic BP Percentile --      Pulse Rate 12/02/23 1051 (!) 105     Resp 12/02/23 1051 20     Temp 12/02/23 1051 98.2 F (36.8 C)     Temp Source 12/02/23 1051 Oral     SpO2 12/02/23 1051 98 %     Weight --      Height --      Head Circumference --      Peak Flow --      Pain Score 12/02/23 1054 10     Pain Loc --      Pain Education --      Exclude from Growth Chart --    No data found.  Updated Vital Signs BP (!) 146/70 (BP Location: Right Arm)   Pulse (!) 105   Temp 98.2 F (36.8 C) (Oral)   Resp 20   SpO2 98%   Visual Acuity  Right Eye Distance:   Left Eye Distance:   Bilateral Distance:    Right Eye Near:   Left Eye Near:    Bilateral Near:     Physical Exam Vitals and nursing note reviewed.  Constitutional:      General: She is not in acute distress.    Appearance: Normal appearance.  HENT:     Head: Normocephalic.  Eyes:     Pupils: Pupils are  equal, round, and reactive to light.  Cardiovascular:     Rate and Rhythm: Normal rate and regular rhythm.     Pulses: Normal pulses.     Heart sounds: Normal heart sounds.  Pulmonary:     Effort: Pulmonary effort is normal.     Breath sounds: Normal breath sounds.  Musculoskeletal:     Right shoulder: Tenderness (anterior shoulder) present. No swelling, deformity or effusion. Decreased range of motion. Normal strength. Normal pulse.     Cervical back: Normal range of motion.     Comments: No obvious deformity, swelling, or erythema of the right shoulder present.   Skin:    General: Skin is warm and dry.  Neurological:     General: No focal deficit present.     Mental Status: She is alert and oriented to person, place, and time.  Psychiatric:        Mood and Affect: Mood normal.        Behavior: Behavior normal.   Glycosade   UC Treatments / Results  Labs (all labs ordered are listed, but only abnormal results are displayed) Labs Reviewed - No data to display  EKG   Radiology DG Shoulder Right Result Date: 12/02/2023 CLINICAL DATA:  right shoulder pain x 3 weeks, decreased ROM EXAM: RIGHT SHOULDER - 2+ VIEW COMPARISON:  05/16/2004 FINDINGS: Osteopenia.No acute fracture or dislocation. Moderate AC joint space loss with 4 mm of undersurface spurring. Moderate degenerative changes of the glenohumeral joint also noted. Soft tissues are unremarkable. Multilevel thoracic osteophytosis. IMPRESSION: 1. Osteopenia.  No acute fracture or dislocation. 2. Moderate osteoarthritis of the AC and glenohumeral joints. Electronically Signed   By: Rogelia Myers M.D.   On: 12/02/2023 12:02    Procedures Procedures (including critical care time)  Medications Ordered in UC Medications - No data to display  Initial Impression / Assessment and Plan / UC Course  I have reviewed the triage vital signs and the nursing notes.  Pertinent labs & imaging results that were available during my care of  the patient were reviewed by me and considered in my medical decision making (see chart for details).  X-ray of the right shoulder is negative for fracture or dislocation; however, x-ray does show moderate osteoarthritis of the AC and glenohumeral joints.  Patient with elevated A1c and GFR is decreased, will avoid corticosteroids and NSAIDs at this time.  Treat with Lidoderm  patch 5% for patient to apply to the right shoulder.  Supportive care recommendations were provided and discussed with the patient to include continuing over-the-counter Tylenol , and the use of ice or heat along with stretching exercises.  Patient was advised if symptoms fail to improve, it is recommended that she follow-up with her PCP for further evaluation.  Patient was in agreement with this plan of care and verbalizes understanding.  All questions were answered.  Patient stable for discharge.  Final Clinical Impressions(s) / UC Diagnoses   Final diagnoses:  Right shoulder pain, unspecified chronicity  Osteoarthritis of right shoulder, unspecified osteoarthritis type   Discharge Instructions  None    ED Prescriptions     Medication Sig Dispense Auth. Provider   lidocaine  (LIDODERM ) 5 % Place 1 patch onto the skin daily. Remove & Discard patch within 12 hours or as directed by MD 30 patch Leath-Warren, Etta PARAS, NP      PDMP not reviewed this encounter.   Gilmer Etta PARAS, NP 12/02/23 [phone]

## 2023-12-02 NOTE — ED Triage Notes (Signed)
 Pt reports right shoulder/arm pain for last several weeks. Pt reports I use my right hand for everything . Pt uses cane on right side at baseline. Pt reports has tried ointments and tylenol  with no change in discomfort. Denies any known injury but reports hx of frequent falls.

## 2023-12-02 NOTE — Discharge Instructions (Addendum)
 The x-ray was negative for fracture or dislocation but does show moderate osteoarthritis in your right shoulder. Apply medication as prescribed. You may continue over-the-counter Tylenol  for pain or discomfort. Recommend the use of ice or heat.  Apply ice for pain or swelling, heat for spasm or stiffness.  Apply for 20 minutes, remove for 1 hour, repeat as needed while symptoms persist. Gentle exercises of the right shoulder to help keep the joint mobile. As discussed, if your symptoms fail to improve, please follow-up with your primary care physician for further evaluation. Follow-up as needed.

## 2023-12-03 ENCOUNTER — Other Ambulatory Visit: Payer: Self-pay | Admitting: Family Medicine

## 2023-12-06 ENCOUNTER — Other Ambulatory Visit (HOSPITAL_COMMUNITY)

## 2023-12-06 ENCOUNTER — Ambulatory Visit (HOSPITAL_COMMUNITY)

## 2023-12-08 DIAGNOSIS — E1151 Type 2 diabetes mellitus with diabetic peripheral angiopathy without gangrene: Secondary | ICD-10-CM | POA: Diagnosis not present

## 2023-12-08 DIAGNOSIS — M79675 Pain in left toe(s): Secondary | ICD-10-CM | POA: Diagnosis not present

## 2023-12-08 DIAGNOSIS — M79671 Pain in right foot: Secondary | ICD-10-CM | POA: Diagnosis not present

## 2023-12-08 DIAGNOSIS — M79672 Pain in left foot: Secondary | ICD-10-CM | POA: Diagnosis not present

## 2023-12-08 DIAGNOSIS — M79674 Pain in right toe(s): Secondary | ICD-10-CM | POA: Diagnosis not present

## 2023-12-15 ENCOUNTER — Ambulatory Visit (HOSPITAL_COMMUNITY)
Admission: RE | Admit: 2023-12-15 | Discharge: 2023-12-15 | Disposition: A | Discharge status: Home / Self Care | Source: Ambulatory Visit | Attending: Family Medicine | Admitting: Family Medicine

## 2023-12-15 DIAGNOSIS — Z780 Asymptomatic menopausal state: Secondary | ICD-10-CM | POA: Insufficient documentation

## 2023-12-15 DIAGNOSIS — Z1231 Encounter for screening mammogram for malignant neoplasm of breast: Secondary | ICD-10-CM | POA: Diagnosis not present

## 2023-12-15 DIAGNOSIS — M8589 Other specified disorders of bone density and structure, multiple sites: Secondary | ICD-10-CM | POA: Diagnosis not present

## 2023-12-21 ENCOUNTER — Ambulatory Visit: Payer: Self-pay

## 2024-01-04 LAB — COMPREHENSIVE METABOLIC PANEL WITH GFR
ALT: 16 IU/L (ref 0–32)
AST: 20 IU/L (ref 0–40)
Albumin: 4.6 g/dL (ref 3.8–4.8)
Alkaline Phosphatase: 116 IU/L (ref 49–135)
BUN/Creatinine Ratio: 13 (ref 12–28)
BUN: 14 mg/dL (ref 8–27)
Bilirubin Total: 0.4 mg/dL (ref 0.0–1.2)
CO2: 20 mmol/L (ref 20–29)
Calcium: 9.9 mg/dL (ref 8.7–10.3)
Chloride: 98 mmol/L (ref 96–106)
Creatinine, Ser: 1.12 mg/dL — ABNORMAL HIGH (ref 0.57–1.00)
Globulin, Total: 3.1 g/dL (ref 1.5–4.5)
Glucose: 331 mg/dL — ABNORMAL HIGH (ref 70–99)
Potassium: 4.3 mmol/L (ref 3.5–5.2)
Sodium: 136 mmol/L (ref 134–144)
Total Protein: 7.7 g/dL (ref 6.0–8.5)
eGFR: 50 mL/min/1.73 — ABNORMAL LOW (ref >59)

## 2024-01-04 LAB — TSH: TSH: 0.463 u[IU]/mL (ref 0.450–4.500)

## 2024-01-04 LAB — T4, FREE: Free T4: 1.43 ng/dL (ref 0.82–1.77)

## 2024-01-06 ENCOUNTER — Encounter: Payer: Self-pay | Admitting: Nurse Practitioner

## 2024-01-06 ENCOUNTER — Ambulatory Visit: Admitting: Nurse Practitioner

## 2024-01-06 VITALS — BP 110/60 | HR 91 | Ht 68.0 in | Wt 195.4 lb

## 2024-01-06 DIAGNOSIS — Z794 Long term (current) use of insulin: Secondary | ICD-10-CM

## 2024-01-06 DIAGNOSIS — E1165 Type 2 diabetes mellitus with hyperglycemia: Secondary | ICD-10-CM | POA: Diagnosis not present

## 2024-01-06 DIAGNOSIS — I10 Essential (primary) hypertension: Secondary | ICD-10-CM | POA: Diagnosis not present

## 2024-01-06 DIAGNOSIS — E890 Postprocedural hypothyroidism: Secondary | ICD-10-CM | POA: Diagnosis not present

## 2024-01-06 DIAGNOSIS — Z7984 Long term (current) use of oral hypoglycemic drugs: Secondary | ICD-10-CM

## 2024-01-06 DIAGNOSIS — E782 Mixed hyperlipidemia: Secondary | ICD-10-CM

## 2024-01-06 DIAGNOSIS — Z7985 Long-term (current) use of injectable non-insulin antidiabetic drugs: Secondary | ICD-10-CM

## 2024-01-06 LAB — HEMOGLOBIN A1C: Hemoglobin A1C: 10.7

## 2024-01-06 MED ORDER — HUMULIN 70/30 KWIKPEN (70-30) 100 UNIT/ML ~~LOC~~ SUPN: 35.0000 [IU] | PEN_INJECTOR | Freq: Two times a day (BID) | SUBCUTANEOUS | Status: AC

## 2024-01-06 NOTE — Progress Notes (Signed)
 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 pylori infection 09/24/2010   Hyperlipidemia    Hypertension    Hypothyroidism    IDDM (insulin  dependent diabetes mellitus) 1987   type 2    Knee pain    Obesity    Past Surgical History:  Procedure Laterality Date   ABDOMINAL HYSTERECTOMY  03/02/1968   CATARACT EXTRACTION W/PHACO Left 03/09/2019   Procedure: CATARACT EXTRACTION PHACO AND INTRAOCULAR LENS PLACEMENT (IOC);  Surgeon: Harrie Agent, MD;  Location: [facility];  Service: Ophthalmology;  Laterality: Left;  CDE: 9.08   CATARACT EXTRACTION W/PHACO Right 03/27/2019   Procedure: CATARACT EXTRACTION PHACO AND INTRAOCULAR LENS PLACEMENT RIGHT EYE;  Surgeon: Harrie Agent, MD;  Location: [facility];  Service: Ophthalmology;  Laterality: Right;  CDE: 7.47   CHOLECYSTECTOMY  OCT 2010 BZ BILIARY DYSKINESIA   CHRONIC CHOLECYSTITIS   COLONOSCOPY  MAY 2009 SCREENING   pTC TICS, [facility]   COLONOSCOPY N/A 08/11/2017   Procedure: COLONOSCOPY;  Surgeon: Harvey Margo CROME, MD;  Location: [facility];  Service: Endoscopy;  Laterality: N/A;  9:30am   ESOPHAGOGASTRODUODENOSCOPY  10/03/2010   Procedure: ESOPHAGOGASTRODUODENOSCOPY (EGD);  Surgeon: Margo CHRISTELLA Harvey, MD;  Location: [facility];  Service: Endoscopy;  Laterality: N/A;   ESOPHAGOGASTRODUODENOSCOPY  12/19/2002   RMR: Normal esophagus/Normal stomach/ Duodenum large bulbar diverticulum, 1 cm bulbar ulcer with surrounding/ inflammation as described above. Normal D2   FOOT SURGERY Left 01/29/2021   POLYPECTOMY  08/11/2017   Procedure: POLYPECTOMY;  Surgeon: Harvey Margo CROME, MD;  Location: [facility];  Service: Endoscopy;;  cecal     SAVORY DILATION  10/03/2010   Procedure: SAVORY DILATION;  Surgeon: Margo CHRISTELLA Harvey, MD;  Location: [facility];  Service: Endoscopy;;   TOTAL ABDOMINAL HYSTERECTOMY  03/02/1968   TOTAL KNEE ARTHROPLASTY Left 12/21/2017   Procedure: LEFT TOTAL KNEE ARTHROPLASTY;  Surgeon: Ernie Cough, MD;  Location: [facility];  Service: Orthopedics;  Laterality: Left;  70 mins block   UPPER GASTROINTESTINAL ENDOSCOPY  OCT 2004 RMR   DUO TIC, & ULCER   Social History   Socioeconomic History   Marital status: Single    Spouse name: Not on file   Number of children: 3   Years of education: 10   Highest education level: 10th grade  Occupational History   Occupation: [employer]-OR   Occupation: Retired   Tobacco Use   Smoking status: Former    Current packs/day: 0.00    Average packs/day: 0.3 packs/day for 10.0 years (2.5 ttl pk-yrs)    Types: Cigarettes    Start date: 02/21/1975    Quit date: 02/20/1985    Years since quitting: 38.9   Smokeless tobacco: Never  Vaping Use   Vaping status: Never Used  Substance and Sexual Activity   Alcohol use: No    Comment: stopped drinking 1998   Drug use: No   Sexual activity: Not Currently  Other Topics Concern   Not on file  Social History Narrative   Lives alone and 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   Social Drivers of Corporate Investment Banker  Strain: Low Risk  (07/01/2023)   Overall Financial Resource Strain (CARDIA)    Difficulty of Paying Living Expenses: Not hard at all  Food Insecurity: No Food Insecurity (07/01/2023)   Hunger Vital Sign    Worried About Running Out of Food in the Last Year: Never true    Ran Out of Food in the Last Year: Never true  Transportation Needs: No Transportation Needs (07/01/2023)   PRAPARE - Administrator, Civil Service (Medical): No    Lack of Transportation (Non-Medical): No  Physical Activity: Sufficiently Active (03/24/2023)   Exercise Vital Sign    Days of Exercise per Week: 7 days    Minutes  of Exercise per Session: 30 min  Stress: No Stress Concern Present (07/01/2023)   [employer] of Occupational Health - Occupational Stress Questionnaire    Feeling of Stress : Not at all  Social Connections: Moderately Integrated (07/01/2023)   Social Connection and Isolation Panel    Frequency of Communication with Friends and Family: More than three times a week    Frequency of Social Gatherings with Friends and Family: More than three times a week    Attends Religious Services: More than 4 times per year    Active Member of [employer] or Organizations: Yes    Attends Banker Meetings: More than 4 times per year    Marital Status: Never married   Outpatient Encounter Medications as of 01/06/2024  Medication Sig   Accu-Chek Softclix Lancets lancets Use as instructed to monitor glucose 3 times daily   amLODipine -benazepril  (LOTREL) 5-40 MG capsule TAKE 1 CAPSULE BY MOUTH DAILY   aspirin  EC 81 MG tablet Take 81 mg by mouth daily.   bisacodyl  (DULCOLAX) 5 MG EC tablet Take 5 mg by mouth daily as needed for moderate constipation.   Blood Glucose Monitoring Suppl (ACCU-CHEK GUIDE ME) w/Device KIT Use to check glucose 3 times daily.   Calcium  Carb-Cholecalciferol (CALCIUM  600/VITAMIN D3 PO) Take 2 tablets by mouth daily.   glucose blood (ACCU-CHEK GUIDE TEST) test strip Use as instructed to monitor glucose 3 times daily   hydrochlorothiazide  (HYDRODIURIL ) 25 MG tablet TAKE 1 TABLET BY MOUTH DAILY   Insulin  Pen Needle (BD PEN NEEDLE MINI ULTRAFINE) 31G X 5 MM MISC Use to inject insulin  twice daily   Lancets (ONETOUCH DELICA PLUS LANCET33G) MISC USE TWICE DAILY TO CHECK BLOOD  SUGAR   levothyroxine  (SYNTHROID ) 88 MCG tablet Take 1 tablet (88 mcg total) by mouth daily before breakfast.   Multiple Vitamin (MULTIVITAMIN) capsule Take 1 capsule by mouth daily.   omeprazole  (PRILOSEC) 20 MG capsule TAKE 1 CAPSULE BY MOUTH IN THE  MORNING   rosuvastatin  (CRESTOR ) 5 MG tablet TAKE 1 TABLET BY  MOUTH 5 DAYS  PER WEEK , MONDAY THOUGH FRIDAY   UNABLE TO FIND Diabetic shoes x 1 pair, Inserts x 3 pair DX E11.9   [DISCONTINUED] insulin  isophane & regular human KwikPen (HUMULIN  70/30 KWIKPEN) (70-30) 100 UNIT/ML KwikPen Inject 25 Units into the skin 2 (two) times daily before a meal.   Blood Glucose Monitoring Suppl (ONE TOUCH ULTRA 2) w/Device KIT Twice daily testing dx e11.65 (Patient not taking: Reported on 01/06/2024)   insulin  isophane & regular human KwikPen (HUMULIN  70/30 KWIKPEN) (70-30) 100 UNIT/ML KwikPen Inject 35 Units into the skin 2 (two) times daily before a meal.   [DISCONTINUED] alendronate  (FOSAMAX ) 70 MG tablet TAKE 1 TABLET BY MOUTH WEEKLY  WITH 8 OZ OF PLAIN WATER  30  MINUTES  BEFORE FIRST FOOD, DRINK OR MEDS. STAY UPRIGHT FOR 30  MINS (Patient not taking: Reported on 09/16/2023)   [DISCONTINUED] Incontinence Supply Disposable (DEPEND UNDERWEAR LARGE/XL) MISC Use for daily incontinence DX R32 (Patient not taking: Reported on 01/06/2024)   [DISCONTINUED] lidocaine  (LIDODERM ) 5 % Place 1 patch onto the skin daily. Remove & Discard patch within 12 hours or as directed by MD   [DISCONTINUED] polyethylene glycol (MIRALAX  / GLYCOLAX ) 17 g packet Take 17 g by mouth as needed. (Patient not taking: Reported on 10/06/2023)   [DISCONTINUED] psyllium (METAMUCIL) 58.6 % packet Take 1 packet by mouth as needed. (Patient not taking: Reported on 10/06/2023)   [DISCONTINUED] sertraline  (ZOLOFT ) 25 MG tablet Take 1 tablet (25 mg total) by mouth daily.   No facility-administered encounter medications on file as of 01/06/2024.   ALLERGIES: Allergies  Allergen Reactions   Pravastatin  Other (See Comments)    PT is currently taking but states she cramps   Simvastatin Other (See Comments)    Muscle cramps Other reaction(s): Not available   VACCINATION STATUS: Immunization History  Administered Date(s) Administered    sv, Bivalent, Protein Subunit Rsvpref,pf [place]) 04/18/2022   Fluad Quad(high  Dose 65+) 11/23/2018, 12/21/2019, 12/10/2020, 11/12/2021, 11/23/2022   Influenza,inj,Quad PF,6+ Mos 11/16/2012, 11/15/2013, 11/14/2014, 11/14/2015, 11/03/2016, 11/09/2017   Influenza,inj,quad, With Preservative 11/02/2016   MODERNA COVID-19 SARS-COV-2 PEDS BIVALENT BOOSTER 40yr-68yr 11/23/2022   Moderna SARS-COV2 Booster Vaccination 07/24/2020, 05/26/2021   Moderna Sars-Covid-2 Vaccination 04/27/2019, 05/26/2019, 01/08/2020   Pneumococcal Conjugate-13 10/11/2013   Pneumococcal Polysaccharide-23 10/07/2011   Tdap 12/16/2020   Zoster Recombinant(Shingrix) 12/16/2020   Zoster, Live 04/05/2006   Zoster, Unspecified 06/16/2021    Diabetes She presents for her follow-up diabetic visit. She has type 2 diabetes mellitus. Onset time: She was diagnosed at approximate age of 40 years. Her disease course has been worsening. Pertinent negatives for hypoglycemia include no confusion, nervousness/anxiousness or tremors. Pertinent negatives for diabetes include no fatigue, no foot paresthesias, no polydipsia, no polyphagia, no polyuria and no weight loss. There are no hypoglycemic complications. Symptoms are stable. Diabetic complications include nephropathy. Risk factors for coronary artery disease include dyslipidemia, diabetes mellitus, obesity, hypertension, sedentary lifestyle, post-menopausal and tobacco exposure. Current diabetic treatment includes insulin  injections and oral agent (monotherapy) (and Ozempic ). She is compliant with treatment most of the time. Her weight is fluctuating minimally. She is following a generally unhealthy diet. When asked about meal planning, she reported none. She has not had a previous visit with a dietitian. She rarely participates in exercise. Her home blood glucose trend is increasing steadily. Her breakfast blood glucose range is generally >200 mg/dl. Her dinner blood glucose range is generally >200 mg/dl. Her overall blood glucose range is >200 mg/dl. (She presents today with  her meter and logs showing at gross hyperglycemia overall.  Her POCT A1c today is 10.7%, increasing from last visit of 8.9%.  Analysis of her meter shows 7-day average of 293, 14-day average of 264, 30-day average of 244, and 90-day average of 230.  She denies any significant hypoglycemia.  She notes she has been in more pain recently with her arthritis but has not been on steroids.) An ACE inhibitor/angiotensin II receptor blocker is being taken. She does not see a podiatrist.Eye exam is current.  Thyroid  Problem Presents for follow-up (-She is also status post radioactive iodine  treatment for toxic nodule on October 17, 2010.  She has had  history of multinodular goiter status post fine-needle aspiration with benign findings.) visit. Patient reports no  anxiety, cold intolerance, constipation, depressed mood, diarrhea, fatigue, heat intolerance, leg swelling, tremors, weight gain or weight loss. The symptoms have been stable.     Review of systems  Constitutional: + Minimally fluctuating body weight,  current Body mass index is 29.71 kg/m. , no fatigue, no subjective hyperthermia, no subjective hypothermia Eyes: no blurry vision, no xerophthalmia ENT: no sore throat, no nodules palpated in throat, no dysphagia/odynophagia, no hoarseness Cardiovascular: no chest pain, no shortness of breath, no palpitations, no leg swelling Respiratory: no cough, no shortness of breath Gastrointestinal: no nausea/vomiting/diarrhea Musculoskeletal: + diffuse muscle/joint aches- walks with cane Skin: no rashes, no hyperemia Neurological: no tremors, no numbness, no tingling, no dizziness Psychiatric: no depression, no anxiety    Objective:    BP 110/60 (BP Location: Right Arm, Patient Position: Sitting, Cuff Size: Large)   Pulse 91   Ht 5' 8 (1.727 m)   Wt 195 lb 6.4 oz (88.6 kg)   BMI 29.71 kg/m   Wt Readings from Last 3 Encounters:  01/06/24 195 lb 6.4 oz (88.6 kg)  10/06/23 199 lb 6.4 oz (90.4 kg)   09/16/23 196 lb 1.9 oz (89 kg)    BP Readings from Last 3 Encounters:  01/06/24 110/60  12/02/23 (!) 146/70  10/06/23 136/74    Physical Exam- Limited  Constitutional:  Body mass index is 29.71 kg/m. , not in acute distress, normal state of mind, ? Memory deficits Eyes:  EOMI, no exophthalmos Musculoskeletal: no gross deformities, strength intact in all four extremities, no gross restriction of joint movements, walks with cane Skin:  no rashes, no hyperemia Neurological: no tremor with outstretched hands    CMP     Component Value Date/Time   NA 136 01/03/2024 1116   K 4.3 01/03/2024 1116   CL 98 01/03/2024 1116   CO2 20 01/03/2024 1116   GLUCOSE 331 (H) 01/03/2024 1116   GLUCOSE 395 (H) 12/28/2020 2201   BUN 14 01/03/2024 1116   CREATININE 1.12 (H) 01/03/2024 1116   CREATININE 0.96 (H) 03/12/2016 0925   CALCIUM  9.9 01/03/2024 1116   PROT 7.7 01/03/2024 1116   ALBUMIN 4.6 01/03/2024 1116   AST 20 01/03/2024 1116   ALT 16 01/03/2024 1116   ALKPHOS 116 01/03/2024 1116   BILITOT 0.4 01/03/2024 1116   GFRNONAA 40 (L) 12/28/2020 2201   GFRNONAA 60 03/12/2016 0925   GFRAA 48 (L) 04/11/2020 1007   GFRAA 69 03/12/2016 0925     Diabetic Labs (most recent): Lab Results  Component Value Date   HGBA1C 8.9 (A) 10/06/2023   HGBA1C 8.2 (A) 06/02/2023   HGBA1C 8.1 (A) 02/01/2023   MICROALBUR 10 04/19/2020   MICROALBUR 0.4 03/12/2016   MICROALBUR 0.2 02/19/2014     Lipid Panel ( most recent) Lipid Panel     Component Value Date/Time   CHOL 158 09/13/2023 0938   TRIG 75 09/13/2023 0938   HDL 57 09/13/2023 0938   CHOLHDL 2.8 09/13/2023 0938   CHOLHDL 2.2 01/11/2016 0916   VLDL 11 01/11/2016 0916   LDLCALC 87 09/13/2023 0938    Assessment & Plan:   1) Uncontrolled type 2 diabetes mellitus with stage 3a chronic kidney disease, with long-term current use of insulin  (HCC)  She presents today with her meter and logs showing at gross hyperglycemia overall.  Her POCT  A1c today is 10.7%, increasing from last visit of 8.9%.  Analysis of her meter shows 7-day average of 293, 14-day average of 264, 30-day average of 244, and  90-day average of 230.  She denies any significant hypoglycemia.  She notes she has been in more pain recently with her arthritis but has not been on steroids.  - Nutritional counseling repeated/built upon at each appointment.  - The patient admits there is a room for improvement in their diet and drink choices. -  Suggestion is made for the patient to avoid simple carbohydrates from their diet including Cakes, Sweet Desserts / Pastries, Ice Cream, Soda (diet and regular), Sweet Tea, Candies, Chips, Cookies, Sweet Pastries, Store Bought Juices, Alcohol in Excess of 1-2 drinks a day, Artificial Sweeteners, Coffee Creamer, and Sugar-free Products. This will help patient to have stable blood glucose profile and potentially avoid unintended weight gain.   - I encouraged the patient to switch to unprocessed or minimally processed complex starch and increased protein intake (animal or plant source), fruits, and vegetables.   - Patient is advised to stick to a routine mealtimes to eat 3 meals a day and avoid unnecessary snacks (to snack only to correct hypoglycemia).  -She is advised to increase her premixed 70/30 insulin  to 35 units SQ BID before meals if glucose is above 90 and she is eating.  She may need another adjustment soon, but want to move slowly for safety purposes.  -She is advised to continue monitoring blood glucose 3 times daily, before breakfast, before supper and before bed, and to call the clinic if she has readings less than 70 or above 200 for 3 tests in a row.  She does not want a CGM, has tried it and does not like it.  2) Hypothyroidism- s/p RAI Her previsit TFTs are consistent with appropriate hormone replacement.  She is advised to continue Levothyroxine  88 mcg po daily before breakfast.    - We discussed about the correct  intake of her thyroid  hormone, on empty stomach at fasting, with water , separated by at least 30 minutes from breakfast and other medications,  and separated by more than 4 hours from calcium , iron, multivitamins, acid reflux medications (PPIs). -Patient is made aware of the fact that thyroid  hormone replacement is needed for life, dose to be adjusted by periodic monitoring of thyroid  function tests.  3) Weight management:  Her Body mass index is 29.71 kg/m.-a candidate for modest weight loss.  Patient specific carbs restrictions and exercise regimen discussed with her.  4) Blood pressure/ Hypertension: -Her blood pressure is controlled to target.  She is advised to continue meds as prescribed by PCP.    5) Hyperlipidemia: Her recent lipid panel from 09/13/23 showed controlled LDL of 87.  PCP currently managing this.  - I advised patient to maintain close follow up with Antonetta Rollene BRAVO, MD for diabetes and all other primary care needs.     I spent  32  minutes in the care of the patient today including review of labs from CMP, Lipids, Thyroid  Function, Hematology (current and previous including abstractions from other facilities); face-to-face time discussing  her blood glucose readings/logs, discussing hypoglycemia and hyperglycemia episodes and symptoms, medications doses, her options of short and long term treatment based on the latest standards of care / guidelines;  discussion about incorporating lifestyle medicine;  and documenting the encounter. Risk reduction counseling performed per USPSTF guidelines to reduce obesity and cardiovascular risk factors.     Please refer to Patient Instructions for Blood Glucose Monitoring and Insulin /Medications Dosing Guide  in media tab for additional information. Please  also refer to  Patient Self Inventory in the  Media  tab for reviewed elements of pertinent patient history.  Nicolle Kovalcik participated in the discussions, expressed  understanding, and voiced agreement with the above plans.  All questions were answered to her satisfaction. she is encouraged to contact clinic should she have any questions or concerns prior to her return visit.   Follow up plan: Return in about 3 months (around 04/07/2024) for Diabetes F/U with A1c in office, No previsit labs, Bring meter and logs.   Benton Rio, [facility] [address] [place], [place] [address] Phone: [phone] Fax: [phone]  01/06/2024, 11:53 AM

## 2024-01-22 ENCOUNTER — Ambulatory Visit: Payer: Self-pay | Admitting: Family Medicine

## 2024-02-04 ENCOUNTER — Ambulatory Visit: Admitting: Family Medicine

## 2024-02-13 ENCOUNTER — Other Ambulatory Visit: Payer: Self-pay | Admitting: Family Medicine

## 2024-03-07 ENCOUNTER — Ambulatory Visit (INDEPENDENT_AMBULATORY_CARE_PROVIDER_SITE_OTHER): Admitting: Family Medicine

## 2024-03-07 ENCOUNTER — Encounter: Payer: Self-pay | Admitting: Family Medicine

## 2024-03-07 VITALS — BP 120/84 | HR 92 | Resp 16 | Ht 68.0 in | Wt 195.0 lb

## 2024-03-07 DIAGNOSIS — E890 Postprocedural hypothyroidism: Secondary | ICD-10-CM | POA: Diagnosis not present

## 2024-03-07 DIAGNOSIS — E1159 Type 2 diabetes mellitus with other circulatory complications: Secondary | ICD-10-CM | POA: Diagnosis not present

## 2024-03-07 DIAGNOSIS — G8929 Other chronic pain: Secondary | ICD-10-CM | POA: Diagnosis not present

## 2024-03-07 DIAGNOSIS — M25511 Pain in right shoulder: Secondary | ICD-10-CM

## 2024-03-07 DIAGNOSIS — E7800 Pure hypercholesterolemia, unspecified: Secondary | ICD-10-CM | POA: Diagnosis not present

## 2024-03-07 DIAGNOSIS — Z794 Long term (current) use of insulin: Secondary | ICD-10-CM | POA: Diagnosis not present

## 2024-03-07 DIAGNOSIS — F322 Major depressive disorder, single episode, severe without psychotic features: Secondary | ICD-10-CM | POA: Diagnosis not present

## 2024-03-07 DIAGNOSIS — H6123 Impacted cerumen, bilateral: Secondary | ICD-10-CM | POA: Diagnosis not present

## 2024-03-07 DIAGNOSIS — I10 Essential (primary) hypertension: Secondary | ICD-10-CM | POA: Diagnosis not present

## 2024-03-07 DIAGNOSIS — Z23 Encounter for immunization: Secondary | ICD-10-CM | POA: Diagnosis not present

## 2024-03-07 DIAGNOSIS — M25512 Pain in left shoulder: Secondary | ICD-10-CM | POA: Diagnosis not present

## 2024-03-07 NOTE — Progress Notes (Signed)
 "  Stacey Hanson     MRN: [id number]      DOB: 1945-10-25  Chief Complaint  Patient presents with   Medical Management of Chronic Issues    6 month follow up    Shoulder Pain    Pt complains of bilateral shoulder pain for a while, limited ROM, worsens with movement    Ear Fullness    Pt complains of ear fullness in both ears     HPI Stacey Hanson is here for follow up and re-evaluation of chronic medical conditions, medication management and review of any available recent lab and radiology data.  Preventive health is updated, specifically  Cancer screening and Immunization.   Questions or concerns regarding consultations or procedures which the PT has had in the interim are  addressed. The PT denies any adverse reactions to current medications since the last visit.  C/o uncontrolled and elevated blood sugar Progressively worsening shoulder pain right worse than left with limitation in mobility C/o ear fullness requests irrigation Denies recent fever or chills. Denies sinus pressure, nasal congestion,  or sore throat. Denies chest congestion, productive cough or wheezing. Denies chest pains, palpitations and leg swelling Denies abdominal pain, nausea, vomiting,diarrhea or constipation.   Denies dysuria, frequency, hesitancy or incontinence. Chronic joint pain,  and limitation in mobility. Denies headaches, seizures, numbness, or tingling.Uses a cane C/o  depression and  anxiety , social isolation to some extent, not suicidal or homicidal, denies insomnia. Denies skin break down or rash.   PE  BP 120/84   Pulse 92   Resp 16   Ht 5' 8 (1.727 m)   Wt 195 lb 0.6 oz (88.5 kg)   SpO2 97%   BMI 29.66 kg/m   Patient alert and oriented and in no cardiopulmonary distress.  HEENT: No facial asymmetry, EOMI,     Neck supple .  Chest: Clear to auscultation bilaterally.  CVS: S1, S2 no murmurs, no S3.Regular rate.  ABD: Soft non tender.   Ext: No edema  MS: Adequate ROM spine,  , hips and knees.Mrkedly decreased ROM of boht shoulders , right worse han left   Skin: Intact, no ulcerations or rash noted.  Psych: Good eye contact, normal affect. Memory intact not anxious or depressed appearing.  CNS: CN 2-12 intact, power,  normal throughout.no focal deficits noted.   Assessment & Plan  Shoulder pain, bilateral 3 month history bilateral shoulder pain, wirht reduced   Depression, major, single episode, severe (HCC) Improved on no medication, encouraged increased involvement in activities outside of the home, she is considering this  Bilateral impacted cerumen Successful ear flush by nursing, atraumatic  Hypothyroidism following radioiodine therapy mAnaged by Endo, recent dose change has upcoming visit  Immunization due After obtaining informed consent, the influenza vaccine is  administered , with no adverse effect noted at the time of administration.   Type 2 diabetes mellitus with other specified complication (HCC) Diabetes associated with hypertension, hyperlipidemia, arthritis, and depression  Stacey Hanson is reminded of the importance of commitment to daily physical activity for 30 minutes or more, as able and the need to limit carbohydrate intake to 30 to 60 grams per meal to help with blood sugar control.   The need to take medication as prescribed, test blood sugar as directed, and to call between visits if there is a concern that blood sugar is uncontrolled is also discussed.   Stacey Hanson is reminded of the importance of daily foot exam, annual eye examination, and good  blood sugar, blood pressure and cholesterol control.     Latest Ref Rng & Units 01/06/2024   12:00 AM 01/03/2024   11:16 AM 10/06/2023    9:07 AM 09/13/2023    9:38 AM 06/02/2023   10:49 AM  Diabetic Labs  HbA1c  10.7      8.9   8.2   Chol 100 - 199 mg/dL    841    HDL >60 mg/dL    57    Calc LDL 0 - 99 mg/dL    87    Triglycerides 0 - 149 mg/dL    75    Creatinine 9.42 - 1.00  mg/dL  8.87   8.81       This result is from an external source.      03/07/2024   11:11 AM 03/07/2024   10:33 AM 01/06/2024   11:24 AM 12/02/2023   10:51 AM 10/06/2023    8:55 AM 09/16/2023   10:41 AM 09/16/2023   10:09 AM  BP/Weight  Systolic BP 120 146 110 146 136 120 148  Diastolic BP 84 76 60 70 74 80 78  Wt. (Lbs)  195.04 195.4  199.4  196.12  BMI  29.66 kg/m2 29.71 kg/m2  30.32 kg/m2  29.82 kg/m2      09/16/2023   10:00 AM 09/15/2022   10:20 AM  Foot/eye exam completion dates  Foot Form Completion Done Done    Recent test showqs poor control, reports improved blood sugar values has visit in next 5 weeks with endoi    Pure hypercholesterolemia Hyperlipidemia:Low fat diet discussed and encouraged.   Lipid Panel  Lab Results  Component Value Date   CHOL 158 09/13/2023   HDL 57 09/13/2023   LDLCALC 87 09/13/2023   TRIG 75 09/13/2023   CHOLHDL 2.8 09/13/2023     Controlled, no change in medication Updated lab needed at/ before next visit.  "

## 2024-03-07 NOTE — Assessment & Plan Note (Signed)
 Successful ear flush by nursing, atraumatic

## 2024-03-07 NOTE — Assessment & Plan Note (Signed)
 Improved on no medication, encouraged increased involvement in activities outside of the home, she is considering this

## 2024-03-07 NOTE — Assessment & Plan Note (Signed)
 Hyperlipidemia:Low fat diet discussed and encouraged.   Lipid Panel  Lab Results  Component Value Date   CHOL 158 09/13/2023   HDL 57 09/13/2023   LDLCALC 87 09/13/2023   TRIG 75 09/13/2023   CHOLHDL 2.8 09/13/2023     Controlled, no change in medication Updated lab needed at/ before next visit.

## 2024-03-07 NOTE — Patient Instructions (Addendum)
 Annual exam in 4.5 months  Flu vaccine today   Urine ACR, Fasting lipid, cmp an EGFr and CBc Jan 22 , or any day that week  You are  referred to Orthopedics on [employer] [address] for shoulder pain   Bilateral ear flush by nursing at visit  Please try to get to senior center  Thanks for choosing [facility], we consider it a privelige to serve you.

## 2024-03-07 NOTE — Assessment & Plan Note (Signed)
 After obtaining informed consent, the influenza vaccine is  administered , with no adverse effect noted at the time of administration.

## 2024-03-07 NOTE — Assessment & Plan Note (Signed)
 Diabetes associated with hypertension, hyperlipidemia, arthritis, and depression  Stacey Hanson is reminded of the importance of commitment to daily physical activity for 30 minutes or more, as able and the need to limit carbohydrate intake to 30 to 60 grams per meal to help with blood sugar control.   The need to take medication as prescribed, test blood sugar as directed, and to call between visits if there is a concern that blood sugar is uncontrolled is also discussed.   Stacey Hanson is reminded of the importance of daily foot exam, annual eye examination, and good blood sugar, blood pressure and cholesterol control.     Latest Ref Rng & Units 01/06/2024   12:00 AM 01/03/2024   11:16 AM 10/06/2023    9:07 AM 09/13/2023    9:38 AM 06/02/2023   10:49 AM  Diabetic Labs  HbA1c  10.7      8.9   8.2   Chol 100 - 199 mg/dL    841    HDL >60 mg/dL    57    Calc LDL 0 - 99 mg/dL    87    Triglycerides 0 - 149 mg/dL    75    Creatinine 9.42 - 1.00 mg/dL  8.87   8.81       This result is from an external source.      03/07/2024   11:11 AM 03/07/2024   10:33 AM 01/06/2024   11:24 AM 12/02/2023   10:51 AM 10/06/2023    8:55 AM 09/16/2023   10:41 AM 09/16/2023   10:09 AM  BP/Weight  Systolic BP 120 146 110 146 136 120 148  Diastolic BP 84 76 60 70 74 80 78  Wt. (Lbs)  195.04 195.4  199.4  196.12  BMI  29.66 kg/m2 29.71 kg/m2  30.32 kg/m2  29.82 kg/m2      09/16/2023   10:00 AM 09/15/2022   10:20 AM  Foot/eye exam completion dates  Foot Form Completion Done Done    Recent test showqs poor control, reports improved blood sugar values has visit in next 5 weeks with endoi

## 2024-03-07 NOTE — Assessment & Plan Note (Signed)
 3 month history bilateral shoulder pain, wirht reduced

## 2024-03-07 NOTE — Assessment & Plan Note (Signed)
 mAnaged by Endo, recent dose change has upcoming visit

## 2024-03-20 ENCOUNTER — Telehealth: Payer: Self-pay

## 2024-03-20 NOTE — Telephone Encounter (Signed)
 Copied from CRM [id number]. Topic: Referral - Question >> Mar 20, 2024 10:58 AM Montie POUR wrote: Reason for CRM:  Henretter is calling to see if Dr. Antonetta or her nurse faxed of the referral to [facility]. She went to [facility] this past Friday and they said they still need the referral faxed over. The faxed number is [phone]. Thanks

## 2024-03-21 NOTE — Telephone Encounter (Signed)
 Referral was sent to [facility] on 03/08/2024.

## 2024-03-23 ENCOUNTER — Ambulatory Visit: Payer: Self-pay | Admitting: Family Medicine

## 2024-03-24 LAB — CMP14+EGFR
ALT: 18 IU/L (ref 0–32)
AST: 22 IU/L (ref 0–40)
Albumin: 4.5 g/dL (ref 3.8–4.8)
Alkaline Phosphatase: 108 IU/L (ref 49–135)
BUN/Creatinine Ratio: 15 (ref 12–28)
BUN: 17 mg/dL (ref 8–27)
Bilirubin Total: 0.3 mg/dL (ref 0.0–1.2)
CO2: 22 mmol/L (ref 20–29)
Calcium: 9.8 mg/dL (ref 8.7–10.3)
Chloride: 97 mmol/L (ref 96–106)
Creatinine, Ser: 1.13 mg/dL — ABNORMAL HIGH (ref 0.57–1.00)
Globulin, Total: 3.5 g/dL (ref 1.5–4.5)
Glucose: 266 mg/dL — ABNORMAL HIGH (ref 70–99)
Potassium: 4 mmol/L (ref 3.5–5.2)
Sodium: 138 mmol/L (ref 134–144)
Total Protein: 8 g/dL (ref 6.0–8.5)
eGFR: 50 mL/min/1.73 — ABNORMAL LOW

## 2024-03-24 LAB — LIPID PANEL
Chol/HDL Ratio: 2.6 ratio (ref 0.0–4.4)
Cholesterol, Total: 154 mg/dL (ref 100–199)
HDL: 59 mg/dL
LDL Chol Calc (NIH): 79 mg/dL (ref 0–99)
Triglycerides: 83 mg/dL (ref 0–149)
VLDL Cholesterol Cal: 16 mg/dL (ref 5–40)

## 2024-03-24 LAB — CBC WITH DIFFERENTIAL/PLATELET
Basophils Absolute: 0.1 x10E3/uL (ref 0.0–0.2)
Basos: 1 %
EOS (ABSOLUTE): 0.1 x10E3/uL (ref 0.0–0.4)
Eos: 1 %
Hematocrit: 41.2 % (ref 34.0–46.6)
Hemoglobin: 13.2 g/dL (ref 11.1–15.9)
Immature Grans (Abs): 0 x10E3/uL (ref 0.0–0.1)
Immature Granulocytes: 0 %
Lymphocytes Absolute: 2.9 x10E3/uL (ref 0.7–3.1)
Lymphs: 36 %
MCH: 30.4 pg (ref 26.6–33.0)
MCHC: 32 g/dL (ref 31.5–35.7)
MCV: 95 fL (ref 79–97)
Monocytes Absolute: 0.5 x10E3/uL (ref 0.1–0.9)
Monocytes: 6 %
Neutrophils Absolute: 4.6 x10E3/uL (ref 1.4–7.0)
Neutrophils: 56 %
Platelets: 442 x10E3/uL (ref 150–450)
RBC: 4.34 x10E6/uL (ref 3.77–5.28)
RDW: 12.1 % (ref 11.7–15.4)
WBC: 8.2 x10E3/uL (ref 3.4–10.8)

## 2024-03-24 LAB — MICROALBUMIN / CREATININE URINE RATIO
Creatinine, Urine: 182.9 mg/dL
Microalb/Creat Ratio: 46 mg/g{creat} — AB (ref 0–29)
Microalbumin, Urine: 83.5 ug/mL

## 2024-03-28 ENCOUNTER — Ambulatory Visit: Payer: Medicare Other

## 2024-03-28 VITALS — Ht 68.0 in | Wt 195.0 lb

## 2024-03-28 DIAGNOSIS — Z0000 Encounter for general adult medical examination without abnormal findings: Secondary | ICD-10-CM

## 2024-03-28 NOTE — Patient Instructions (Signed)
 Ms. Everhart,  Thank you for taking the time for your Medicare Wellness Visit. I appreciate your continued commitment to your health goals. Please review the care plan we discussed, and feel free to reach out if I can assist you further.  Please note that Annual Wellness Visits do not include a physical exam. Some assessments may be limited, especially if the visit was conducted virtually. If needed, we may recommend an in-person follow-up with your provider.  Ongoing Care Seeing your primary care provider every 3 to 6 months helps us  monitor your health and provide consistent, personalized care.   Aim for 30 minutes of exercise or brisk walking, 6-8 glasses of water , and 5 servings of fruits and vegetables each day.  Referrals If a referral was made during today's visit and you haven't received any updates within two weeks, please contact the referred provider directly to check on the status.  Recommended Screenings:  Health Maintenance  Topic Date Due   Eye exam for diabetics  05/14/2023   COVID-19 Vaccine (7 - 2025-26 season) 11/01/2023   Medicare Annual Wellness Visit  03/23/2024   Hemoglobin A1C  07/05/2024   Complete foot exam   09/15/2024   Breast Cancer Screening  12/14/2024   Yearly kidney function blood test for diabetes  03/22/2025   Kidney health urinalysis for diabetes  03/22/2025   Osteoporosis screening with Bone Density Scan  12/14/2025   DTaP/Tdap/Td vaccine (2 - Td or Tdap) 12/17/2030   Pneumococcal Vaccine for age over 1  Completed   Flu Shot  Completed   Hepatitis C Screening  Completed   Zoster (Shingles) Vaccine  Completed   Meningitis B Vaccine  Aged Out   Colon Cancer Screening  Discontinued       03/28/2024    1:52 PM  Advanced Directives  Does Patient Have a Medical Advance Directive? No  Would patient like information on creating a medical advance directive? No - Patient declined    Vision: Annual vision screenings are recommended for early  detection of glaucoma, cataracts, and diabetic retinopathy. These exams can also reveal signs of chronic conditions such as diabetes and high blood pressure.  Dental: Annual dental screenings help detect early signs of oral cancer, gum disease, and other conditions linked to overall health, including heart disease and diabetes.  Please see the attached documents for additional preventive care recommendations.

## 2024-03-28 NOTE — Progress Notes (Signed)
 "  Last eye exam requested Chief Complaint  Patient presents with   Medicare Wellness     Subjective:   Stacey Hanson is a 79 y.o. female who presents for a Medicare Annual Wellness Visit.  Visit info / Clinical Intake: Medicare Wellness Visit Type:: Subsequent Annual Wellness Visit Persons participating in visit and providing information:: patient Medicare Wellness Visit Mode:: Telephone If telephone:: video declined Since this visit was completed virtually, some vitals may be partially provided or unavailable. Missing vitals are due to the limitations of the virtual format.: Documented vitals are patient reported If Telephone or Video please confirm:: I connected with patient using audio/video enable telemedicine. I verified patient identity with two identifiers, discussed telehealth limitations, and patient agreed to proceed. Patient Location:: home Provider Location:: home office Interpreter Needed?: No Pre-visit prep was completed: yes AWV questionnaire completed by patient prior to visit?: no Living arrangements:: (!) lives alone Patient's Overall Health Status Rating: good Typical amount of pain: some Does pain affect daily life?: no Are you currently prescribed opioids?: no  Dietary Habits and Nutritional Risks How many meals a day?: 3 Eats fruit and vegetables daily?: yes Most meals are obtained by: preparing own meals In the last 2 weeks, have you had any of the following?: none Diabetic:: (!) yes Any non-healing wounds?: no How often do you check your BS?: 3 Would you like to be referred to a Nutritionist or for Diabetic Management? : no  Functional Status Activities of Daily Living (to include ambulation/medication): Independent Ambulation: Independent with device- listed below Home Assistive Devices/Equipment: Johna Finder (specify Type) Medication Administration: Independent Home Management (perform basic housework or laundry): Independent Manage your own  finances?: yes Primary transportation is: facility / other Concerns about vision?: no *vision screening is required for WTM* Concerns about hearing?: no  Fall Screening Falls in the past year?: 1 Number of falls in past year: 0 Was there an injury with Fall?: 1 Fall Risk Category Calculator: 2 Patient Fall Risk Level: Moderate Fall Risk  Fall Risk Patient at Risk for Falls Due to: History of fall(s); Orthopedic patient; Impaired balance/gait; Impaired mobility Fall risk Follow up: Falls evaluation completed; Education provided; Falls prevention discussed  Home and Transportation Safety: All rugs have non-skid backing?: yes All stairs or steps have railings?: yes Grab bars in the bathtub or shower?: yes Have non-skid surface in bathtub or shower?: yes Good home lighting?: yes Regular seat belt use?: yes Hospital stays in the last year:: no  Cognitive Assessment Difficulty concentrating, remembering, or making decisions? : no Will 6CIT or Mini Cog be Completed: yes What year is it?: 0 points What month is it?: 0 points Give patient an address phrase to remember (5 components): [address] [place] About what time is it?: 0 points Count backwards from 20 to 1: 0 points Say the months of the year in reverse: 0 points Repeat the address phrase from earlier: 0 points 6 CIT Score: 0 points  Advance Directives (For Healthcare) Does Patient Have a Medical Advance Directive?: No Would patient like information on creating a medical advance directive?: No - Patient declined  Reviewed/Updated  Reviewed/Updated: Reviewed All (Medical, Surgical, Family, Medications, Allergies, Care Teams, Patient Goals)    Allergies (verified) Pravastatin  and Simvastatin   Current Medications (verified) Outpatient Encounter Medications as of 03/28/2024  Medication Sig   Accu-Chek Softclix Lancets lancets Use as instructed to monitor glucose 3 times daily   amLODipine -benazepril  (LOTREL) 5-40  MG capsule TAKE 1 CAPSULE BY  MOUTH DAILY   aspirin  EC 81 MG tablet Take 81 mg by mouth daily.   bisacodyl  (DULCOLAX) 5 MG EC tablet Take 5 mg by mouth daily as needed for moderate constipation.   Blood Glucose Monitoring Suppl (ACCU-CHEK GUIDE ME) w/Device KIT Use to check glucose 3 times daily.   Calcium  Carb-Cholecalciferol (CALCIUM  600/VITAMIN D3 PO) Take 2 tablets by mouth daily.   glucose blood (ACCU-CHEK GUIDE TEST) test strip Use as instructed to monitor glucose 3 times daily   hydrochlorothiazide  (HYDRODIURIL ) 25 MG tablet TAKE 1 TABLET BY MOUTH DAILY   insulin  isophane & regular human KwikPen (HUMULIN  70/30 KWIKPEN) (70-30) 100 UNIT/ML KwikPen Inject 35 Units into the skin 2 (two) times daily before a meal.   Insulin  Pen Needle (BD PEN NEEDLE MINI ULTRAFINE) 31G X 5 MM MISC Use to inject insulin  twice daily   Lancets (ONETOUCH DELICA PLUS LANCET33G) MISC USE TWICE DAILY TO CHECK BLOOD  SUGAR   levothyroxine  (SYNTHROID ) 88 MCG tablet Take 1 tablet (88 mcg total) by mouth daily before breakfast.   Multiple Vitamin (MULTIVITAMIN) capsule Take 1 capsule by mouth daily.   omeprazole  (PRILOSEC) 20 MG capsule TAKE 1 CAPSULE BY MOUTH IN THE  MORNING   rosuvastatin  (CRESTOR ) 5 MG tablet TAKE 1 TABLET BY MOUTH 5 DAYS  PER WEEK , MONDAY THOUGH FRIDAY   UNABLE TO FIND Diabetic shoes x 1 pair, Inserts x 3 pair DX E11.9   [DISCONTINUED] Blood Glucose Monitoring Suppl (ONE TOUCH ULTRA 2) w/Device KIT Twice daily testing dx e11.65 (Patient not taking: Reported on 03/07/2024)   No facility-administered encounter medications on file as of 03/28/2024.    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    Knee pain    Obesity    Past Surgical History:  Procedure Laterality Date   ABDOMINAL HYSTERECTOMY  03/02/1968   CATARACT EXTRACTION W/PHACO Left 03/09/2019   Procedure: CATARACT EXTRACTION PHACO AND INTRAOCULAR LENS PLACEMENT (IOC);  Surgeon: Harrie Agent, MD;  Location: [facility];  Service: Ophthalmology;  Laterality: Left;  CDE: 9.08   CATARACT EXTRACTION W/PHACO Right 03/27/2019   Procedure: CATARACT EXTRACTION PHACO AND INTRAOCULAR LENS PLACEMENT RIGHT EYE;  Surgeon: Harrie Agent, MD;  Location: [facility];  Service: Ophthalmology;  Laterality: Right;  CDE: 7.47   CHOLECYSTECTOMY  OCT 2010 BZ BILIARY DYSKINESIA   CHRONIC CHOLECYSTITIS   COLONOSCOPY  MAY 2009 SCREENING   pTC TICS, [facility]   COLONOSCOPY N/A 08/11/2017   Procedure: COLONOSCOPY;  Surgeon: Harvey Margo CROME, MD;  Location: [facility];  Service: Endoscopy;  Laterality: N/A;  9:30am   ESOPHAGOGASTRODUODENOSCOPY  10/03/2010   Procedure: ESOPHAGOGASTRODUODENOSCOPY (EGD);  Surgeon: Margo CHRISTELLA Harvey, MD;  Location: [facility];  Service: Endoscopy;  Laterality: N/A;   ESOPHAGOGASTRODUODENOSCOPY  12/19/2002   RMR: Normal esophagus/Normal stomach/ Duodenum large bulbar diverticulum, 1 cm bulbar ulcer with surrounding/ inflammation as described above. Normal D2   FOOT SURGERY Left 01/29/2021   POLYPECTOMY  08/11/2017   Procedure: POLYPECTOMY;  Surgeon: Harvey Margo CROME, MD;  Location: [facility];  Service: Endoscopy;;  cecal    SAVORY DILATION  10/03/2010   Procedure: SAVORY DILATION;  Surgeon: Margo CHRISTELLA Haddock, MD;  Location: [facility];  Service: Endoscopy;;   TOTAL ABDOMINAL HYSTERECTOMY  03/02/1968   TOTAL KNEE ARTHROPLASTY Left 12/21/2017   Procedure: LEFT TOTAL KNEE ARTHROPLASTY;  Surgeon: Ernie Cough, MD;  Location: [facility];  Service: Orthopedics;  Laterality: Left;  70 mins block   UPPER GASTROINTESTINAL ENDOSCOPY  OCT 2004 RMR   DUO TIC, & ULCER   Family History  Problem Relation Age of Onset    Diabetes Mother    Hypertension Mother    Heart failure Mother    Cancer Father 91       prostate and colon   Diabetes Sister    Hypertension Sister    Hypertension Sister    Diabetes Sister    Diabetes Brother    Hypertension Brother    Diabetes Brother    Hypertension Brother    Coronary artery disease Brother 56       MI   Alcohol abuse Son    Colon cancer Neg Hx    Colon polyps Neg Hx    Social History   Occupational History   Occupation: [employer]-OR   Occupation: Retired   Tobacco Use   Smoking status: Former    Current packs/day: 0.00    Average packs/day: 0.3 packs/day for 10.0 years (2.5 ttl pk-yrs)    Types: Cigarettes    Start date: 02/21/1975    Quit date: 02/20/1985    Years since quitting: 39.1   Smokeless tobacco: Never  Vaping Use   Vaping status: Never Used  Substance and Sexual Activity   Alcohol use: No    Comment: stopped drinking 1998   Drug use: No   Sexual activity: Not Currently   Tobacco Counseling Counseling given: Yes  SDOH Screenings   Food Insecurity: No Food Insecurity (03/28/2024)  Housing: Low Risk (03/28/2024)  Transportation Needs: No Transportation Needs (03/28/2024)  Utilities: Not At Risk (03/28/2024)  Alcohol Screen: Low Risk (03/24/2023)  Depression (PHQ2-9): Low Risk (03/28/2024)  Recent Concern: Depression (PHQ2-9) - Medium Risk (03/07/2024)  Financial Resource Strain: Low Risk (07/01/2023)  Physical Activity: Sufficiently Active (03/28/2024)  Social Connections: Moderately Integrated (03/28/2024)  Stress: No Stress Concern Present (03/28/2024)  Tobacco Use: Medium Risk (03/28/2024)  Health Literacy: Adequate Health Literacy (03/28/2024)   See flowsheets for full screening details  Depression Screen PHQ 2 & 9 Depression Scale- Over the past 2 weeks, how often have you been bothered by any of the following problems? Little interest or pleasure in doing things: 0 Feeling down, depressed, or hopeless (PHQ Adolescent also  includes...irritable): 0 PHQ-2 Total Score: 0 Trouble falling or staying asleep, or sleeping too much: 1 Feeling tired or having little energy: 0 Poor appetite or overeating (PHQ Adolescent also includes...weight loss): 0 Feeling bad about yourself - or that you are a failure or have let yourself or your family down: 0 Trouble concentrating on things, such as reading the newspaper or watching television (PHQ Adolescent also includes...like school work): 0 Moving or speaking so slowly that other people could have noticed. Or the opposite - being so fidgety or restless that you have been moving around a lot more than usual: 0 Thoughts that you would be better off dead, or of hurting yourself in some way: 0 PHQ-9 Total Score: 1 If you checked off any problems, how difficult have these problems made it for you to do your work, take care of things at home, or get along with  other people?: Not difficult at all  Depression Treatment Depression Interventions/Treatment : [id number] Score <4 Follow-up Not Indicated     Goals Addressed             This Visit's Progress    Remain active and healthy   On track    To remain active and healthy and for my family to remain active and healthy             Objective:    Today's Vitals   03/28/24 1350  Weight: 195 lb (88.5 kg)  Height: 5' 8 (1.727 m)   Body mass index is 29.65 kg/m.  Hearing/Vision screen Hearing Screening - Comments:: Patient denies any hearing difficulties.   Vision Screening - Comments:: Patient is up to date on yearly eye exams with Oneil Kawasaki @ [facility] [place] location  Immunizations and Health Maintenance Health Maintenance  Topic Date Due   OPHTHALMOLOGY EXAM  05/14/2023   COVID-19 Vaccine (7 - 2025-26 season) 11/01/2023   Medicare Annual Wellness (AWV)  03/23/2024   HEMOGLOBIN A1C  07/05/2024   FOOT EXAM  09/15/2024   Mammogram  12/14/2024   Diabetic kidney evaluation - eGFR measurement  03/22/2025    Diabetic kidney evaluation - Urine ACR  03/22/2025   Bone Density Scan  12/14/2025   DTaP/Tdap/Td (2 - Td or Tdap) 12/17/2030   Pneumococcal Vaccine: 50+ Years  Completed   Influenza Vaccine  Completed   Hepatitis C Screening  Completed   Zoster Vaccines- Shingrix  Completed   Meningococcal B Vaccine  Aged Out   Colonoscopy  Discontinued        Assessment/Plan:  This is a routine wellness examination for Stacey Hanson.  Patient Care Team: Antonetta Rollene BRAVO, MD as PCP - General Nida, Ethelle ORN, MD (Endocrinology) Kawasaki Oneil, DO (Optometry) Darina Shasta HERO, CPhT as [facility] Care Management (Pharmacy Technician) Cindie Carlin POUR, DO as Consulting Physician (Internal Medicine) Jacinto Lonni PARAS, [facility] (Inactive) (Pharmacist) Merlynn Ozell SAUNDERS, DPM as Attending Physician (Podiatry) Merlynn Ozell SAUNDERS, DPM as Attending Physician (Podiatry) Cindie Carlin POUR, DO as Consulting Physician (Gastroenterology) Robbi Havens, MD as Referring Physician (Dentistry) System, Provider Not In as Surgeon System, Provider Not In (Podiatry) Margrette Taft BRAVO, MD as Consulting Physician (Orthopedic Surgery) Karis Clunes, MD as Consulting Physician (Otolaryngology)  I have personally reviewed and noted the following in the patients chart:   Medical and social history Use of alcohol, tobacco or illicit drugs  Current medications and supplements including opioid prescriptions. Functional ability and status Nutritional status Physical activity Advanced directives List of other physicians Hospitalizations, surgeries, and ER visits in previous 12 months Vitals Screenings to include cognitive, depression, and falls Referrals and appointments  No orders of the defined types were placed in this encounter.  In addition, I have reviewed and discussed with patient certain preventive protocols, quality metrics, and best practice recommendations. A written personalized care plan  for preventive services as well as general preventive health recommendations were provided to patient.   Kailie Polus, CMA   03/28/2024   Return March 30, 2025 at 11:20 am, for In office Medicare Well Visit w  Wellness Nurse.  After Visit Summary: (Mail) Due to this being a telephonic visit, the after visit summary with patients personalized plan was offered to patient via mail    "

## 2024-04-27 ENCOUNTER — Ambulatory Visit: Admitting: Nurse Practitioner

## 2024-07-19 ENCOUNTER — Ambulatory Visit: Payer: Self-pay | Admitting: Family Medicine

## 2025-03-30 ENCOUNTER — Ambulatory Visit: Payer: Self-pay
